# Patient Record
Sex: Female | Born: 1940
Health system: Southern US, Community
[De-identification: ages and names within clinical notes are randomized; demographics above are authoritative.]

## PROBLEM LIST (undated history)

## (undated) DIAGNOSIS — M545 Low back pain, unspecified: Secondary | ICD-10-CM

## (undated) DIAGNOSIS — R011 Cardiac murmur, unspecified: Secondary | ICD-10-CM

## (undated) DIAGNOSIS — F0390 Unspecified dementia without behavioral disturbance: Secondary | ICD-10-CM

## (undated) DIAGNOSIS — F32A Depression, unspecified: Secondary | ICD-10-CM

## (undated) DIAGNOSIS — F419 Anxiety disorder, unspecified: Secondary | ICD-10-CM

## (undated) DIAGNOSIS — S82899A Other fracture of unspecified lower leg, initial encounter for closed fracture: Secondary | ICD-10-CM

## (undated) DIAGNOSIS — M797 Fibromyalgia: Secondary | ICD-10-CM

## (undated) DIAGNOSIS — M1990 Unspecified osteoarthritis, unspecified site: Secondary | ICD-10-CM

## (undated) DIAGNOSIS — K219 Gastro-esophageal reflux disease without esophagitis: Secondary | ICD-10-CM

## (undated) DIAGNOSIS — I214 Non-ST elevation (NSTEMI) myocardial infarction: Secondary | ICD-10-CM

## (undated) DIAGNOSIS — H3500 Unspecified background retinopathy: Secondary | ICD-10-CM

## (undated) DIAGNOSIS — I2510 Atherosclerotic heart disease of native coronary artery without angina pectoris: Secondary | ICD-10-CM

## (undated) DIAGNOSIS — F329 Major depressive disorder, single episode, unspecified: Secondary | ICD-10-CM

## (undated) DIAGNOSIS — G8929 Other chronic pain: Secondary | ICD-10-CM

## (undated) DIAGNOSIS — G459 Transient cerebral ischemic attack, unspecified: Secondary | ICD-10-CM

## (undated) DIAGNOSIS — E109 Type 1 diabetes mellitus without complications: Secondary | ICD-10-CM

## (undated) DIAGNOSIS — M8580 Other specified disorders of bone density and structure, unspecified site: Secondary | ICD-10-CM

## (undated) HISTORY — PX: APPENDECTOMY: SHX54

## (undated) HISTORY — PX: EYE SURGERY: SHX253

## (undated) HISTORY — PX: TONSILLECTOMY: SUR1361

## (undated) HISTORY — PX: EXPLORATORY LAPAROTOMY: SUR591

## (undated) HISTORY — PX: DILATION AND CURETTAGE OF UTERUS: SHX78

## (undated) HISTORY — PX: BACK SURGERY: SHX140

## (undated) HISTORY — PX: LAPAROSCOPIC LYSIS OF ADHESIONS: SHX5905

## (undated) HISTORY — PX: FRACTURE SURGERY: SHX138

## (undated) HISTORY — PX: BILATERAL CARPAL TUNNEL RELEASE: SHX6508

---

## 1970-06-07 HISTORY — PX: ABDOMINAL HYSTERECTOMY: SHX81

## 1979-02-06 HISTORY — PX: OOPHORECTOMY: SHX86

## 1996-06-07 HISTORY — PX: CATARACT EXTRACTION W/ INTRAOCULAR LENS  IMPLANT, BILATERAL: SHX1307

## 1997-08-05 HISTORY — PX: VITRECTOMY: SHX106

## 1997-09-05 ENCOUNTER — Encounter: Admission: RE | Admit: 1997-09-05 | Discharge: 1997-12-04 | Payer: Self-pay | Admitting: Orthopedic Surgery

## 1998-11-11 ENCOUNTER — Emergency Department (HOSPITAL_COMMUNITY): Admission: EM | Admit: 1998-11-11 | Discharge: 1998-11-11 | Payer: Self-pay | Admitting: Emergency Medicine

## 1998-11-11 ENCOUNTER — Encounter: Payer: Self-pay | Admitting: Emergency Medicine

## 1999-01-29 ENCOUNTER — Ambulatory Visit: Admission: RE | Admit: 1999-01-29 | Discharge: 1999-01-29 | Payer: Self-pay | Admitting: Orthopedic Surgery

## 1999-01-29 ENCOUNTER — Encounter: Payer: Self-pay | Admitting: Orthopedic Surgery

## 1999-02-02 ENCOUNTER — Ambulatory Visit (HOSPITAL_COMMUNITY): Admission: RE | Admit: 1999-02-02 | Discharge: 1999-02-02 | Payer: Self-pay | Admitting: Internal Medicine

## 1999-02-02 ENCOUNTER — Inpatient Hospital Stay (HOSPITAL_COMMUNITY): Admission: EM | Admit: 1999-02-02 | Discharge: 1999-02-03 | Payer: Self-pay | Admitting: Emergency Medicine

## 1999-02-02 ENCOUNTER — Encounter: Payer: Self-pay | Admitting: Internal Medicine

## 1999-02-06 HISTORY — PX: BUNIONECTOMY WITH HAMMERTOE RECONSTRUCTION: SHX5600

## 1999-03-06 ENCOUNTER — Ambulatory Visit (HOSPITAL_COMMUNITY): Admission: RE | Admit: 1999-03-06 | Discharge: 1999-03-07 | Payer: Self-pay | Admitting: Orthopedic Surgery

## 1999-07-09 HISTORY — PX: CORONARY ANGIOPLASTY WITH STENT PLACEMENT: SHX49

## 1999-07-24 ENCOUNTER — Ambulatory Visit (HOSPITAL_COMMUNITY): Admission: RE | Admit: 1999-07-24 | Discharge: 1999-07-25 | Payer: Self-pay | Admitting: Cardiovascular Disease

## 1999-08-11 ENCOUNTER — Ambulatory Visit (HOSPITAL_COMMUNITY): Admission: RE | Admit: 1999-08-11 | Discharge: 1999-08-11 | Payer: Self-pay | Admitting: Gastroenterology

## 1999-08-27 ENCOUNTER — Ambulatory Visit (HOSPITAL_COMMUNITY): Admission: RE | Admit: 1999-08-27 | Discharge: 1999-08-27 | Payer: Self-pay | Admitting: Endocrinology

## 1999-08-27 ENCOUNTER — Encounter: Payer: Self-pay | Admitting: Endocrinology

## 2000-03-02 ENCOUNTER — Emergency Department (HOSPITAL_COMMUNITY): Admission: EM | Admit: 2000-03-02 | Discharge: 2000-03-02 | Payer: Self-pay | Admitting: Emergency Medicine

## 2000-03-22 ENCOUNTER — Inpatient Hospital Stay (HOSPITAL_COMMUNITY): Admission: EM | Admit: 2000-03-22 | Discharge: 2000-03-22 | Payer: Self-pay | Admitting: Emergency Medicine

## 2000-04-22 ENCOUNTER — Ambulatory Visit (HOSPITAL_COMMUNITY): Admission: RE | Admit: 2000-04-22 | Discharge: 2000-04-22 | Payer: Self-pay | Admitting: Endocrinology

## 2000-05-01 ENCOUNTER — Emergency Department (HOSPITAL_COMMUNITY): Admission: EM | Admit: 2000-05-01 | Discharge: 2000-05-01 | Payer: Self-pay | Admitting: Emergency Medicine

## 2000-05-01 ENCOUNTER — Encounter: Payer: Self-pay | Admitting: Emergency Medicine

## 2000-07-04 ENCOUNTER — Encounter: Admission: RE | Admit: 2000-07-04 | Discharge: 2000-07-14 | Payer: Self-pay | Admitting: Orthopedic Surgery

## 2001-03-14 ENCOUNTER — Other Ambulatory Visit: Admission: RE | Admit: 2001-03-14 | Discharge: 2001-03-14 | Payer: Self-pay | Admitting: Obstetrics and Gynecology

## 2001-05-29 ENCOUNTER — Encounter: Admission: RE | Admit: 2001-05-29 | Discharge: 2001-08-27 | Payer: Self-pay | Admitting: Orthopedic Surgery

## 2001-06-07 HISTORY — PX: TRIGGER FINGER RELEASE: SHX641

## 2001-06-23 ENCOUNTER — Ambulatory Visit: Admission: RE | Admit: 2001-06-23 | Discharge: 2001-06-23 | Payer: Self-pay | Admitting: Orthopedic Surgery

## 2002-04-12 ENCOUNTER — Other Ambulatory Visit: Admission: RE | Admit: 2002-04-12 | Discharge: 2002-04-12 | Payer: Self-pay | Admitting: Obstetrics and Gynecology

## 2002-06-07 HISTORY — PX: CERVICAL DISC SURGERY: SHX588

## 2002-10-03 ENCOUNTER — Ambulatory Visit (HOSPITAL_COMMUNITY): Admission: RE | Admit: 2002-10-03 | Discharge: 2002-10-03 | Payer: Self-pay | Admitting: Gastroenterology

## 2003-04-18 ENCOUNTER — Encounter: Admission: RE | Admit: 2003-04-18 | Discharge: 2003-04-18 | Payer: Self-pay | Admitting: Internal Medicine

## 2003-05-08 HISTORY — PX: ANTERIOR CERVICAL DECOMP/DISCECTOMY FUSION: SHX1161

## 2003-05-09 ENCOUNTER — Inpatient Hospital Stay (HOSPITAL_COMMUNITY): Admission: RE | Admit: 2003-05-09 | Discharge: 2003-05-10 | Payer: Self-pay | Admitting: Orthopaedic Surgery

## 2004-09-30 ENCOUNTER — Encounter: Admission: RE | Admit: 2004-09-30 | Discharge: 2004-09-30 | Payer: Self-pay | Admitting: Internal Medicine

## 2005-03-07 HISTORY — PX: LUMBAR MICRODISCECTOMY: SHX99

## 2005-03-15 ENCOUNTER — Ambulatory Visit (HOSPITAL_COMMUNITY): Admission: RE | Admit: 2005-03-15 | Discharge: 2005-03-17 | Payer: Self-pay | Admitting: Orthopaedic Surgery

## 2005-04-14 ENCOUNTER — Emergency Department (HOSPITAL_COMMUNITY): Admission: EM | Admit: 2005-04-14 | Discharge: 2005-04-14 | Payer: Self-pay | Admitting: Family Medicine

## 2005-08-12 ENCOUNTER — Encounter: Admission: RE | Admit: 2005-08-12 | Discharge: 2005-08-12 | Payer: Self-pay | Admitting: Specialist

## 2005-11-18 ENCOUNTER — Encounter: Admission: RE | Admit: 2005-11-18 | Discharge: 2005-11-18 | Payer: Self-pay | Admitting: Gastroenterology

## 2007-04-08 HISTORY — PX: PATELLA FRACTURE SURGERY: SHX735

## 2007-04-27 ENCOUNTER — Ambulatory Visit (HOSPITAL_BASED_OUTPATIENT_CLINIC_OR_DEPARTMENT_OTHER): Admission: RE | Admit: 2007-04-27 | Discharge: 2007-04-28 | Payer: Self-pay | Admitting: Orthopedic Surgery

## 2007-09-19 ENCOUNTER — Emergency Department (HOSPITAL_COMMUNITY): Admission: EM | Admit: 2007-09-19 | Discharge: 2007-09-19 | Payer: Self-pay | Admitting: Emergency Medicine

## 2007-12-17 ENCOUNTER — Encounter: Admission: RE | Admit: 2007-12-17 | Discharge: 2007-12-17 | Payer: Self-pay | Admitting: Orthopedic Surgery

## 2007-12-27 ENCOUNTER — Encounter: Admission: RE | Admit: 2007-12-27 | Discharge: 2007-12-27 | Payer: Self-pay | Admitting: Orthopedic Surgery

## 2008-01-06 HISTORY — PX: VERTEBROPLASTY: SHX113

## 2008-01-15 ENCOUNTER — Encounter: Admission: RE | Admit: 2008-01-15 | Discharge: 2008-01-15 | Payer: Self-pay | Admitting: Orthopedic Surgery

## 2008-01-20 ENCOUNTER — Encounter: Admission: RE | Admit: 2008-01-20 | Discharge: 2008-01-20 | Payer: Self-pay | Admitting: Radiology

## 2008-01-31 ENCOUNTER — Encounter: Admission: RE | Admit: 2008-01-31 | Discharge: 2008-01-31 | Payer: Self-pay | Admitting: Radiology

## 2008-08-18 ENCOUNTER — Emergency Department (HOSPITAL_COMMUNITY): Admission: EM | Admit: 2008-08-18 | Discharge: 2008-08-18 | Payer: Self-pay | Admitting: Emergency Medicine

## 2009-06-27 ENCOUNTER — Emergency Department (HOSPITAL_COMMUNITY): Admission: EM | Admit: 2009-06-27 | Discharge: 2009-06-27 | Payer: Self-pay | Admitting: Emergency Medicine

## 2010-06-29 ENCOUNTER — Encounter: Payer: Self-pay | Admitting: Radiology

## 2010-08-24 LAB — BASIC METABOLIC PANEL
BUN: 7 mg/dL (ref 6–23)
CO2: 28 mEq/L (ref 19–32)
Calcium: 9.6 mg/dL (ref 8.4–10.5)
Chloride: 100 mEq/L (ref 96–112)
Creatinine, Ser: 0.74 mg/dL (ref 0.4–1.2)
GFR calc Af Amer: >60 mL/min (ref >60)
GFR calc non Af Amer: >60 mL/min (ref >60)
Glucose, Bld: 141 mg/dL — ABNORMAL HIGH (ref 70–99)
Potassium: 3.8 mEq/L (ref 3.5–5.1)
Sodium: 136 mEq/L (ref 135–145)

## 2010-08-24 LAB — CBC
HCT: 33.2 % — ABNORMAL LOW (ref 36.0–46.0)
Hemoglobin: 11 g/dL — ABNORMAL LOW (ref 12.0–15.0)
MCHC: 33.1 g/dL (ref 30.0–36.0)
MCV: 86.2 fL (ref 78.0–100.0)
Platelets: 239 10*3/uL (ref 150–400)
RBC: 3.86 MIL/uL — ABNORMAL LOW (ref 3.87–5.11)
RDW: 14.9 % (ref 11.5–15.5)
WBC: 3.9 10*3/uL — ABNORMAL LOW (ref 4.0–10.5)

## 2010-08-24 LAB — DIFFERENTIAL
Basophils Absolute: 0 10*3/uL (ref 0.0–0.1)
Basophils Relative: 1 % (ref 0–1)
Monocytes Absolute: 0.3 10*3/uL (ref 0.1–1.0)
Neutro Abs: 2 10*3/uL (ref 1.7–7.7)
Neutrophils Relative %: 53 % (ref 43–77)

## 2010-08-24 LAB — GLUCOSE, CAPILLARY: Glucose-Capillary: 133 mg/dL — ABNORMAL HIGH (ref 70–99)

## 2010-10-20 NOTE — Op Note (Signed)
Beth Payne, Beth Payne              ACCOUNT NO.:  [id number]   MEDICAL RECORD NO.:  [id number]          PATIENT TYPE:  AMB   LOCATION:  DSC                          FACILITY:  [facility]   PHYSICIAN:  Loreta Ave, M.D. DATE OF BIRTH:  05-23-1941   DATE OF PROCEDURE:  04/27/2007  DATE OF DISCHARGE:                               OPERATIVE REPORT   PREOPERATIVE DIAGNOSIS:  Markedly comminuted closed left patellar  fracture, displaced.   POSTOPERATIVE DIAGNOSIS:  Markedly comminuted closed left patellar  fracture, displaced.   PROCEDURE:  Open reduction internal fixation, left patella fracture with  two cannulated 4.0 screws and figure-of-eight 18-gauge wire placed  through the screws.   SURGEON:  Loreta Ave, MD   ASSISTANT:  Zonia Kief, PA present throughout the entire case.   ANESTHESIA:  General.   BLOOD LOSS:  Minimal.   TOURNIQUET TIME:  1 hour.   SPECIMENS:  None.   COMPLICATIONS:  None.   DRESSING:  Soft compressive with knee immobilizer.   PROCEDURE:  The patient brought to the operating room, placed on  operating table in supine position.  After adequate anesthesia been  obtained, tourniquet applied to the upper aspect of the left leg.  Prepped and draped in usual sterile fashion.  Exsanguinated with  elevation and Esmarch, tourniquet inflated to 300 mmHg.  Straight  incision over the front of the knee.  Skin and subcutaneous tissue  divided.  Front of the patella exposed.  Four main fragments, one above,  one on either side, one below.  These were all reduced as anatomically  as possible.  The hemarthrosis drained from the knee.  The top fragment  had some separation of the articular cartilage behind this.  I was able  to reduce all fragments anatomically, put two K-wires and two screws  across this and then laced a figure-of-eight wire through these.  This  gave excellent tension, repair and reconstruction of the entire patella.  I still had a little bit  of a step off articular cartilage that through  a small lateral arthrotomy I could reduce so that it was covered with no  more than a mm step off throughout.  Going through full motion,  fragments did not move and the knee cap felt very smooth.  Fluoroscopic  guidance used to confirm adequate reduction and fixation.  The knee  thoroughly irrigated from hemarthrosis, a small lateral arthrotomy  closed.  The patellar tendon was then closed over top of the  front of patella to cover up the hardware so it would not be painful.  Wound irrigated and closed with Vicryl and staples.  Sterile compressive  dressing applied.  Tourniquet deflated and removed.  Knee immobilizer  applied.  Anesthesia reversed.  Brought to the recovery room.  Tolerated  surgery well.  No complications.      Loreta Ave, M.D.  Electronically Signed     DFM/MEDQ  D:  04/27/2007  T:  04/28/2007  Job:  [id number]

## 2010-10-23 NOTE — Op Note (Signed)
   Beth Payne, Beth Payne                        ACCOUNT NO.:  [id number]   MEDICAL RECORD NO.:  [id number]                   PATIENT TYPE:  AMB   LOCATION:  ENDO                                 FACILITY:  [facility]   PHYSICIAN:  Bernette Redbird, M.D.                DATE OF BIRTH:  December 03, 1940   DATE OF PROCEDURE:  10/03/2002  DATE OF DISCHARGE:                                 OPERATIVE REPORT   PROCEDURE PERFORMED:  Colonoscopy.   ENDOSCOPIST:  Florencia Reasons, M.D.   INDICATIONS FOR PROCEDURE:  Screening for colon cancer in a 70 year old  diabetic with a family history of colon cancer.   FINDINGS:  Grossly normal exam to the cecum.  Mediocre prep.   DESCRIPTION OF PROCEDURE:  The nature, purpose and risks of the procedure  were familiar to the patient, who provided written consent.  Sedation was  fentanyl  75 mcg and Versed 9 mg IV prior to and during the course of the  procedure.   The Olympus adjustable tension pediatric video colonoscope was advanced with  some looping through the colon, controlled by external abdominal  compression, turning the patient into the supine position and ultimately  reaching the cecum after taking out multiple loops.  The adult colonoscope  might work better on future exams.  The quality of the prep was except in  several areas where there was some puddling of vegetable debris which would  clog the scope and prevent complete suctioning of the puddles.  Thus, there  were some small areas where mucosal lesions or small sessile polyps might  have been obscured, but it is not felt that any large lesions would have  been missed.   This was a normal examination.  No polyps, cancer, colitis, vascular  malformations or diverticulosis were noted.  Retroflexion in the rectum and  reinspection of the rectum was normal.  No biopsies were obtained.  The  patient tolerated the procedure well and there were no apparent  complications.    IMPRESSION:  Normal  colonoscopy in a patient with family history of colon  cancer.   PLAN:  Repeat colonoscopy in five years in view of the family history of  colon cancer.                                                 Bernette Redbird, M.D.    RB/MEDQ  D:  10/03/2002  T:  10/03/2002  Job:  [id number]   cc:   Jeannett Senior A. Evlyn Kanner, M.D.  [address]  [place]  [place] [address]  Fax: [phone]

## 2010-10-23 NOTE — Op Note (Signed)
Beth Payne, Beth Payne              ACCOUNT NO.:  [id number]   MEDICAL RECORD NO.:  [id number]          PATIENT TYPE:  INP   LOCATION:  2852                         FACILITY:  [facility]   PHYSICIAN:  Sharolyn Douglas, M.D.        DATE OF BIRTH:  1940-08-18   DATE OF PROCEDURE:  03/15/2005  DATE OF DISCHARGE:                                 OPERATIVE REPORT   DIAGNOSIS:  Right L5-S1 herniated nucleus pulposus with right lower  extremity radiculopathy.   PROCEDURE:  Right L5-S1 microdiskectomy.   SURGEON:  Sharolyn Douglas, M.D.   ASSISTANT:  Verlin Fester, P.A.   ANESTHESIA:  General endotracheal.   COMPLICATIONS:  None.   INDICATION:  The patient is a 70 year old female with severe pain and  weakness in her right lower extremity secondary to large extruded disk  herniation at L5-S1.  Her symptoms are refractory to pain indication and her  weakness is progressing.  We therefore elected to proceed with L5-S1  microdiskectomy in hopes of improving her symptoms.  Risks and benefits were  reviewed.   PROCEDURE:  The patient was identified in the holding area and taken to the  operating room, underwent general endotracheal anesthesia without  difficulty, given prophylactic IV antibiotics.  Carefully positioned prone  on the Wilson frame and all bony prominences padded, face and eyes protected  at all times, back prepped and draped in usual sterile fashion.  Fluoroscopy  was into the field and the L5-S1 interspace was identified.  A small 2-cm  incision made.  Dissection was carried through the deep fascia.  The  dilators from the [facility] retractor were used to dock onto the L5-S1  interspace using fluoroscopy.  The 50-mm blades were attached to the MaXcess  retractor and placed over the final dilator.  The retractor was spread and  then attached to the operating room table using the attachment arm.  The  microscope was draped and brought into the field.  The L5-S1 level was  confirmed using  fluoroscopy.  The inferior 1/3 of the L5 lamina was removed  using a high-speed bur.  The ligamentum flavum was removed piecemeal using  Kerrison punches.  The S1 nerve root was identified and gently mobilized  medially.  A large disk herniation was identified.  This disk space was  entered and multiple small degenerative materials of disk were removed.  We  then worked underneath the dura, decompressing the extruded herniation back  into the interspace and removing this with pituitaries.  The disk space was  irrigated.  Complete hemostasis was achieved.  We felt we had a good  decompression of spinal canal; the S1 nerve root was completely free.  The  wound was irrigated.  Two milliliters of fentanyl were left in the exposed  epidural space for postoperative analgesia.  A small piece of Gelfoam  was left over the laminotomy.  Deep fascia was closed with an 0 Vicryl,  subcutaneous layer closed with 2-0 Vicryl followed by Dermabond on the skin  edges.  The patient was turned supine, extubated without difficulty and  transferred to Recovery  in stable condition.      Sharolyn Douglas, M.D.  Electronically Signed     MC/MEDQ  D:  03/15/2005  T:  03/16/2005  Job:  [id number]

## 2010-10-23 NOTE — Cardiovascular Report (Signed)
[facility]  Patient:    Beth Payne, Beth Payne                     MRN: [id number] Proc. Date: 03/22/00 Adm. Date:  [id number] Disc. Date: [id number] Attending:  Koren Bound CC:         Tera Mater Evlyn Kanner, M.D.                        Cardiac Catheterization  INDICATIONS:  Ms. Huebert is a 70 year old female with a history of diabetes mellitus and history of coronary artery disease.  She presented to the office today with 24 hours of chest pain relieved with nitroglycerin.  She was brought to the catheterization lab for urgent heart catheterization for further evaluation of this persistent chest pain.  DESCRIPTION OF PROCEDURE:  The right femoral artery was easily cannulated using a modified Seldinger technique.  HEMODYNAMICS:  The LV pressure is 154/17 with an aortic pressure of 155/74.  ANGIOGRAPHY:  The left main coronary artery is relatively smooth and normal.  The left circumflex artery is very heavily calcified but there are no true stenoses in the proximal segment.  There are minor luminal irregularities in the mid segment between 20-30%.  The mid and distal LAD have only minor luminal irregularities but are otherwise unremarkable.  The first diagonal vessel is a fairly large vessel and is essentially normal.  The left circumflex artery is fairly normal in its proximal segment.  It gives off two obtuse marginal arteries which are normal.  The third obtuse marginal artery is relatively small and is normal.  The circumflex artery terminates as a small posterolateral segment artery.  The right coronary artery is a large and dominant vessel.  There is a 20-30% stenosis at the proximal bend.  The mid RCA stent is widely patent.  The posterior descending artery and several posterolateral segment arteries are all normal.  LEFT VENTRICULOGRAM:  The left ventriculogram was performed in a 30 RAO position.  It reveals overall normal left  ventricular systolic function with no segmental wall motion abnormalities.  There is a whiff of mitral regurgitation but it does not appear to be significant.  Some of this mitral regurgitation could be due to catheter-induced ectopy.  COMPLICATIONS:  None.  CONCLUSIONS: 1. Patent coronary arteries. 2. Widely patent right coronary artery stent. 3. Overall normal left ventricular systolic function. DD:  03/22/00 TD:  03/23/00 Job: [id number]

## 2010-10-23 NOTE — Op Note (Signed)
Beth Payne, Beth Payne                        ACCOUNT NO.:  [id number]   MEDICAL RECORD NO.:  [id number]                   PATIENT TYPE:  INP   LOCATION:  NA                                   FACILITY:  [facility]   PHYSICIAN:  Sharolyn Douglas, M.D.                     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   PROCEDURE:  1. Anterior cervical diskectomy C5-6.  2. Anterior cervical arthrodesis C5-6 with placement of a 6-mm NuVasive     allograft prosthesis spacer packed with local autogenous bone graft.  3. Anterior cervical plating C5-6 utilizing the Trinica system.   SURGEON:  Sharolyn Douglas, M.D.   ASSISTANT:  Verlin Fester, P.A.   ANESTHESIA:  General endotracheal anesthesia.   COMPLICATIONS:  None.   INDICATIONS FOR PROCEDURE:  The patient is a 70 year old female with chronic  neck  and left upper extremity pain. Her plain radiographs showed  degenerative changes at C5-6 with left anterior and posterior  osteophytes.  An MRI scan shows similar findings with neuroforaminal narrowing. Because of  her persistent symptoms unresponsive to conservative care, she has elected  to undergo ACDF at C5-6 in hopes of improving her symptomatology. The  risks, benefits and alternatives were extensively discussed and she elected  to proceed.   DESCRIPTION OF PROCEDURE:  The patient was properly identified  in the  holding area and taken to the operating room. She underwent  general  endotracheal anesthesia without difficulty. She was given prophylactic IV  antibiotics. She was carefully positioned on the operating table with the  neck  in slight extension. The  neck  was prepped and draped in the usual  sterile fashion and 5 pounds of halter traction was applied.   A 3-cm incision was made on the left side in a transverse fashion at the  level of the cricoid cartilage. Dissection was carried sharply through the  platysma. The antral between the SCM and the strap muscles  was developed  down to the prevertebral space. The C5-6 level was easily identified  by the  large anterior osteophytes. A spinal needle  was placed and x-rays were  taken to confirm this location. The esophagus, trachea and carotid sheath  were identified  and protected at all times.   The longus coli muscle was elevated out over  the C5-6 disk space  bilaterally  using electrocautery. We placed a deep Shadowline retractor.  The large anterior osteophyte was taken down with a Leksell rongeur. We then  placed Caspar distraction pins and placed general distraction across the  C5-  6 interspace. This allowed [place] to enter the severely compressed space using  curets, performing a diskectomy back to the posterior longitudinal ligament.  There were large uncovertebral spurs and posterior  osteophytes.  These  again were debrided with the high-speed bur and 2-mm Kerrison.   We completed wide foraminotomies bilaterally. We confirmed  that the  foramina were  patent using a blunt probe. We controlled all bleeding with  bipolar electrocautery and Gelfoam. The cartilaginous endplates were  scraped.   We then placed a 6-mm NuVasive allograft prosthesis spacer which had been  packed with local autogenous bone graft obtained from the bur shavings. The  graft was carefully  countersunk 1 mm. We checked posteriorly  to confirm  that there was room. We then placed a 22-mm Trinica plate with four 12-mm  screws. The screws had good purchase. We ensured the locking mechanism  engaged.   The wound was irrigated. The esophagus, trachea and carotid sheath were  examined and found to be intact. We  took intraoperative x-rays showing good  position of the interbody graft  and plate. The deep Penrose drain was  placed. The platysma was closed with interrupted 2-0 Vicryl subcutaneous  layer closed with interrupted 3-0 Vicryl followed  by a running 4-0 Vicryl  in a subcuticular fashion on the skin. Benzoin and Steri-Strips were placed.  A sterile dressing was applied. A soft collar was placed.   The patient was extubated without difficulty and transferred to the recovery  room in stable condition. She was able to move her upper and lower  extremities.                                               Sharolyn Douglas, M.D.    MC/MEDQ  D:  05/09/2003  T:  05/10/2003  Job:  [id number]

## 2010-10-23 NOTE — H&P (Signed)
NAMEAERABELLA, GALASSO                        ACCOUNT NO.:  [id number]   MEDICAL RECORD NO.:  [id number]                   PATIENT TYPE:  INP   LOCATION:  NA                                   FACILITY:  [facility]   PHYSICIAN:  Sharolyn Douglas, M.D.                     DATE OF BIRTH:  04-Mar-1941   DATE OF ADMISSION:  05/09/2003  DATE OF DISCHARGE:                                HISTORY & PHYSICAL   CHIEF COMPLAINT:  Severe neck pain and left upper extremity pain and  numbness and weakness.   HISTORY OF PRESENT ILLNESS:  The patient is a 70 year old female who has  been suffering with neck vein and upper extremity pain numbness and weakness  for some time now.  She has failed to improve, unfortunately, despite  conservative management including anti-inflammatory medications, pain  medications, activity modification and physical therapy.  At this point, her  symptoms are progressively getting worse and starting to limit her  activities of daily living, and quality of life has suffered.  Secondary to  these physical exam findings as well as findings of cervical radiculopathy  at C5-6, it was felt that her best course of management would be an ACDS at  C5-6.  The risks and benefits of this procedure were discussed with the  patient by Dr. Noel Gerold as well as myself and she indicated understanding and  opted to proceed.   ALLERGIES:  ANTICOAGULANTS, CODEINE causing nausea and vomiting, VIBRAMYCIN  causes rash and swelling, and VOLTAREN causes retinal hemorrhages.   MEDICATIONS:  1. Hormone replacement.  2. Nexium 40 mg daily.  3. Effexor 75 mg daily.  4. Fosamax 70 mg every week.  5. Calcium daily.  6. Enteric coated aspirin daily.  7. Nitrotab 0.4 mg as needed.  8. The patient is also on insulin pump at a rate of 12.5/h and a bolus rate     of 1-15   PAST MEDICAL HISTORY:  1. Significant for type 1 diabetes x51 years.  2. Retinopathy.  3. Cataract.  4. Arthritis.  5. GERD.  6. Coronary  artery disease.   PAST SURGICAL HISTORY:  1. C-section in 1968.  2. Hysterectomy in 1972.  3. Numerous laparoscopies secondary to adhesions.  4. Cataracts bilaterally in 1998.  5. Bunionectomy in 2000.  6. Cardiac stent placement in February 2001.  7. Trigger finger release in 2003.  8. Carpal tunnel release in 2004.   SOCIAL HISTORY:  The patient denies tobacco use and alcohol use.  She is  widowed, had 2 grown children, has 1 grandchild. She does have numerous  friends in the area through her postoperative course.   FAMILY MEDICAL HISTORY:  Noncontributory.   REVIEW OF SYSTEMS:  The patient denies fevers, chills, sweats, or bleeding  tendencies.  CNS:  Denies blurred vision or double vision, seizures,  headache or paralysis.  CARDIOVASCULAR:  Denies chest pain,  angina, No  claudication or palpitations.  PULMONARY:  Denies shortness of breath, cough  or hemoptysis.  GI:  Denies nausea, vomiting, constipation or diarrhea,  melena or bloody stool.  GU: No dysuria, hematuria, or discharge.  MUSCULOSKELETAL:  As per HPI.   PHYSICAL EXAMINATION:  VITAL SIGNS:  Pulse is 86 and regular; respirations  16 and unlabored.  GENERAL APPEARANCE:  The patient is a 70 year old black female who is alert  and oriented in no acute distress. She is well nourished and well groomed.  Appears her stated age, pleasant, and cooperative to exam.  HEENT:  Head is normocephalic, atraumatic.  Pupils are equal, round, and  reactive.  Extraocular movements are intact.  Nares patent.  Pharynx clear.  NECK:  Supple to palpation.  No lymphadenopathy, thyromegaly or bruits  appreciated.  CHEST:  Chest was clear to auscultation bilaterally.  No rales, rhonchi,  stridor or wheezes.  BREASTS:  Not pertinent and not performed.  HEART:  S1, S2 regular rate and rhythm.  No murmurs, gallops, or rubs noted.  ABDOMEN:  Soft to palpation, nontender, nondistended, no organomegaly noted.  Positive bowel sounds  throughout.  GENITOURINARY:  Not pertinent, not performed.  EXTREMITIES:  Motor function and sensation are grossly intact x4.  Pulses  are intact and symmetric.  Calves are soft and nontender.  SKIN:  Skin is intact without any lesions or rashes.   X-ray and MRI revealed degenerative disk disease in cervical spine.   IMPRESSION:  1. 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  2. Diabetes type x 51 years.  3. Cataracts.  4. Gastroesophageal reflux disease.  5. Retinopathy.   PLAN:  1. Admit to [facility] on May 09, 2003 for an ACDS of C5-6 will     be done by Dr. Sharolyn Douglas.  2. Patient's primary care physician Dr. Evlyn Kanner.  3. Patient's cardiologist Armanda Magic, M.D.  4. We will manage the patient's diabetes. She will continue on her insulin     pump assuming she is alert postoperatively and oriented enough to manage     it; otherwise we will put her on a sliding scale insulin and resume the     insulin pump when she is able to manage it.      Verlin Fester, P.A.                       Sharolyn Douglas, M.D.    CM/MEDQ  D:  05/09/2003  T:  05/09/2003  Job:  [id number]

## 2010-10-23 NOTE — Cardiovascular Report (Signed)
[facility]  Patient:    Beth Payne, Beth Payne                     MRN: [id number] Proc. Date: 07/24/99 Adm. Date:  [id number] Attending:  Koren Bound CC:         Cardiac Catheterization Laboratory             Jeannett Senior A. Evlyn Kanner, M.D.                        Cardiac Catheterization  PROCEDURE:  Left heart catheterization with coronary angiography, with a percutaneous transluminal coronary angioplasty and stenting of the right coronary artery.  CARDIOLOGIST:  Alvia Grove., M.D.  INDICATIONS:  [name] is a 70 year old female with a history of diabetes mellitus and coronary artery disease.  She has a known moderate stenosis in her right coronary artery by heart catheterization in August 2000.  She presented to the office with several weeks of exertional angina.  She is referred for a catheterization and possible angioplasty, for further evaluation.  DESCRIPTION OF PROCEDURE:  The right femoral artery was easily cannulated using a modified Seldinger technique.  HEMODYNAMICS: Left ventricular pressure:  158/12. Aortic pressure:  158/69.  ANGIOGRAPHY: 1. Left main coronary artery:  Is relatively normal. 2. Left anterior descending coronary artery: Is very heavily calcified in the    proximal segment, although this is associated with only a 20%-30% stenosis.    The LAD has only minor luminal irregularities throughout its course. 3. Left circumflex coronary artery:  Is a moderate to large vessel.  It gives    off several moderate-sized obtuse marginal branches, all of which are fairly    normal. 4. Right coronary artery:  Is a moderate to large vessel. It is dominant.    There are diffuse irregularities in the proximal segment between 20%-30%,    followed by a midstenosis of approximately 70%-75% stenosis.  The distal    right coronary artery has diffuse irregularities between 20%-30%.  LEFT VENTRICULOGRAM:  Was performed in the  30-degree RAO position.  It reveals normal left ventricular systolic function with no segmental wall motion abnormalities.  PERCUTANEOUS TRANSLUMINAL CORONARY ANGIOPLASTY:  The 6-French catheter and sheath were removed and a 7-French system was inserted into the right femoral artery.  7-French multipurpose hockey stick side-hole guide was placed into the ostium of the right coronary artery, and 300 units of heparin were given, as well as a double bolus Integrilin drip.  A short luge wire was placed into the distal right coronary artery and a 3.0 mm x 15.0 mm NIR [place]ale with SOX was placed down into the midlesion.  It was deployed at 12 atmospheres for 57 seconds, resulting in a nice angiographic lumen.  There was brisk flow and no evidence of dissection.  COMPLICATIONS:  None.  CONCLUSION: 1. Successful percutaneous transluminal coronary angioplasty and stenting of    the mid-right coronary artery. 2. Normal left ventricular systolic function.DD:  07/24/99 TD:  07/25/99 Job: [id number]

## 2010-12-15 ENCOUNTER — Ambulatory Visit: Payer: Medicare Other | Attending: Neurology | Admitting: Physical Therapy

## 2010-12-15 DIAGNOSIS — R269 Unspecified abnormalities of gait and mobility: Secondary | ICD-10-CM | POA: Insufficient documentation

## 2010-12-15 DIAGNOSIS — M6281 Muscle weakness (generalized): Secondary | ICD-10-CM | POA: Insufficient documentation

## 2010-12-15 DIAGNOSIS — IMO0001 Reserved for inherently not codable concepts without codable children: Secondary | ICD-10-CM | POA: Insufficient documentation

## 2010-12-24 ENCOUNTER — Ambulatory Visit: Payer: Medicare Other | Admitting: Physical Therapy

## 2010-12-29 ENCOUNTER — Ambulatory Visit: Payer: Medicare Other | Admitting: Physical Therapy

## 2011-01-01 ENCOUNTER — Ambulatory Visit: Payer: Medicare Other | Admitting: Physical Therapy

## 2011-01-06 ENCOUNTER — Ambulatory Visit: Payer: Medicare Other | Attending: Neurology | Admitting: Physical Therapy

## 2011-01-06 DIAGNOSIS — M6281 Muscle weakness (generalized): Secondary | ICD-10-CM | POA: Insufficient documentation

## 2011-01-06 DIAGNOSIS — IMO0001 Reserved for inherently not codable concepts without codable children: Secondary | ICD-10-CM | POA: Insufficient documentation

## 2011-01-06 DIAGNOSIS — R269 Unspecified abnormalities of gait and mobility: Secondary | ICD-10-CM | POA: Insufficient documentation

## 2011-01-08 ENCOUNTER — Ambulatory Visit: Payer: Medicare Other | Admitting: Physical Therapy

## 2011-01-11 ENCOUNTER — Encounter: Payer: Medicare Other | Admitting: Physical Therapy

## 2011-01-12 ENCOUNTER — Ambulatory Visit: Payer: Medicare Other | Admitting: Physical Therapy

## 2011-01-14 ENCOUNTER — Ambulatory Visit: Payer: Medicare Other | Admitting: Physical Therapy

## 2011-01-18 ENCOUNTER — Ambulatory Visit: Payer: Medicare Other | Admitting: Physical Therapy

## 2011-01-21 ENCOUNTER — Ambulatory Visit: Payer: Medicare Other | Admitting: Physical Therapy

## 2011-01-25 ENCOUNTER — Ambulatory Visit: Payer: Medicare Other | Admitting: Physical Therapy

## 2011-01-28 ENCOUNTER — Ambulatory Visit: Payer: Medicare Other | Admitting: Physical Therapy

## 2011-01-29 ENCOUNTER — Other Ambulatory Visit: Payer: Self-pay | Admitting: Neurology

## 2011-01-29 DIAGNOSIS — R269 Unspecified abnormalities of gait and mobility: Secondary | ICD-10-CM

## 2011-01-29 DIAGNOSIS — E1142 Type 2 diabetes mellitus with diabetic polyneuropathy: Secondary | ICD-10-CM

## 2011-01-29 DIAGNOSIS — M549 Dorsalgia, unspecified: Secondary | ICD-10-CM

## 2011-02-03 ENCOUNTER — Ambulatory Visit: Payer: Medicare Other | Admitting: Physical Therapy

## 2011-02-05 ENCOUNTER — Ambulatory Visit: Payer: Medicare Other | Admitting: Physical Therapy

## 2011-02-10 ENCOUNTER — Ambulatory Visit
Admission: RE | Admit: 2011-02-10 | Discharge: 2011-02-10 | Disposition: A | Discharge status: Home / Self Care | Payer: Medicare Other | Source: Ambulatory Visit | Attending: Neurology | Admitting: Neurology

## 2011-02-10 ENCOUNTER — Ambulatory Visit: Payer: Medicare Other | Admitting: Physical Therapy

## 2011-02-10 DIAGNOSIS — M549 Dorsalgia, unspecified: Secondary | ICD-10-CM

## 2011-02-10 DIAGNOSIS — E1142 Type 2 diabetes mellitus with diabetic polyneuropathy: Secondary | ICD-10-CM

## 2011-02-10 DIAGNOSIS — R269 Unspecified abnormalities of gait and mobility: Secondary | ICD-10-CM

## 2011-02-11 ENCOUNTER — Ambulatory Visit: Payer: Medicare Other | Admitting: Physical Therapy

## 2011-02-15 ENCOUNTER — Ambulatory Visit: Payer: Medicare Other | Admitting: Physical Therapy

## 2011-02-18 ENCOUNTER — Ambulatory Visit: Payer: Medicare Other | Admitting: Physical Therapy

## 2011-02-22 ENCOUNTER — Ambulatory Visit: Payer: Medicare Other | Admitting: Physical Therapy

## 2011-02-24 ENCOUNTER — Ambulatory Visit: Payer: Medicare Other | Admitting: Physical Therapy

## 2011-03-02 LAB — POCT I-STAT, CHEM 8
Calcium, Ion: 1.21
Chloride: 98
HCT: 35 — ABNORMAL LOW

## 2011-03-03 ENCOUNTER — Other Ambulatory Visit: Payer: Self-pay | Admitting: Sports Medicine

## 2011-03-03 DIAGNOSIS — M25511 Pain in right shoulder: Secondary | ICD-10-CM

## 2011-03-07 ENCOUNTER — Ambulatory Visit
Admission: RE | Admit: 2011-03-07 | Discharge: 2011-03-07 | Disposition: A | Discharge status: Home / Self Care | Payer: Medicare Other | Source: Ambulatory Visit | Attending: Sports Medicine | Admitting: Sports Medicine

## 2011-03-07 DIAGNOSIS — M25511 Pain in right shoulder: Secondary | ICD-10-CM

## 2011-03-16 LAB — I-STAT 8, (EC8 V) (CONVERTED LAB)
Acid-Base Excess: 2
Bicarbonate: 26.8 — ABNORMAL HIGH
Chloride: 103
HCT: 30 — ABNORMAL LOW
Operator id: 279391
TCO2: 28
pCO2, Ven: 42.4 — ABNORMAL LOW

## 2011-05-07 ENCOUNTER — Other Ambulatory Visit: Payer: Self-pay | Admitting: Orthopedic Surgery

## 2011-05-07 DIAGNOSIS — M25511 Pain in right shoulder: Secondary | ICD-10-CM

## 2011-05-08 ENCOUNTER — Other Ambulatory Visit: Payer: Medicare Other

## 2011-05-10 ENCOUNTER — Ambulatory Visit
Admission: RE | Admit: 2011-05-10 | Discharge: 2011-05-10 | Disposition: A | Discharge status: Home / Self Care | Payer: Medicare Other | Source: Ambulatory Visit | Attending: Orthopedic Surgery | Admitting: Orthopedic Surgery

## 2011-05-10 DIAGNOSIS — M25511 Pain in right shoulder: Secondary | ICD-10-CM

## 2011-06-16 DIAGNOSIS — M25519 Pain in unspecified shoulder: Secondary | ICD-10-CM | POA: Diagnosis not present

## 2011-06-16 DIAGNOSIS — M19019 Primary osteoarthritis, unspecified shoulder: Secondary | ICD-10-CM | POA: Diagnosis not present

## 2011-06-16 DIAGNOSIS — F330 Major depressive disorder, recurrent, mild: Secondary | ICD-10-CM | POA: Diagnosis not present

## 2011-06-30 DIAGNOSIS — E1039 Type 1 diabetes mellitus with other diabetic ophthalmic complication: Secondary | ICD-10-CM | POA: Diagnosis not present

## 2011-06-30 DIAGNOSIS — E11359 Type 2 diabetes mellitus with proliferative diabetic retinopathy without macular edema: Secondary | ICD-10-CM | POA: Diagnosis not present

## 2011-06-30 DIAGNOSIS — M67919 Unspecified disorder of synovium and tendon, unspecified shoulder: Secondary | ICD-10-CM | POA: Diagnosis not present

## 2011-06-30 DIAGNOSIS — M719 Bursopathy, unspecified: Secondary | ICD-10-CM | POA: Diagnosis not present

## 2011-06-30 DIAGNOSIS — Z4681 Encounter for fitting and adjustment of insulin pump: Secondary | ICD-10-CM | POA: Diagnosis not present

## 2011-07-07 DIAGNOSIS — F330 Major depressive disorder, recurrent, mild: Secondary | ICD-10-CM | POA: Diagnosis not present

## 2011-07-20 DIAGNOSIS — F3189 Other bipolar disorder: Secondary | ICD-10-CM | POA: Diagnosis not present

## 2011-07-28 DIAGNOSIS — F330 Major depressive disorder, recurrent, mild: Secondary | ICD-10-CM | POA: Diagnosis not present

## 2011-08-04 DIAGNOSIS — E1039 Type 1 diabetes mellitus with other diabetic ophthalmic complication: Secondary | ICD-10-CM | POA: Diagnosis not present

## 2011-08-04 DIAGNOSIS — I2510 Atherosclerotic heart disease of native coronary artery without angina pectoris: Secondary | ICD-10-CM | POA: Diagnosis not present

## 2011-08-04 DIAGNOSIS — R413 Other amnesia: Secondary | ICD-10-CM | POA: Diagnosis not present

## 2011-08-04 DIAGNOSIS — M67919 Unspecified disorder of synovium and tendon, unspecified shoulder: Secondary | ICD-10-CM | POA: Diagnosis not present

## 2011-08-11 DIAGNOSIS — E1139 Type 2 diabetes mellitus with other diabetic ophthalmic complication: Secondary | ICD-10-CM | POA: Diagnosis not present

## 2011-08-11 DIAGNOSIS — R413 Other amnesia: Secondary | ICD-10-CM | POA: Diagnosis not present

## 2011-08-11 DIAGNOSIS — E1039 Type 1 diabetes mellitus with other diabetic ophthalmic complication: Secondary | ICD-10-CM | POA: Diagnosis not present

## 2011-08-11 DIAGNOSIS — Z4681 Encounter for fitting and adjustment of insulin pump: Secondary | ICD-10-CM | POA: Diagnosis not present

## 2011-08-18 DIAGNOSIS — M19019 Primary osteoarthritis, unspecified shoulder: Secondary | ICD-10-CM | POA: Diagnosis not present

## 2011-08-18 DIAGNOSIS — F330 Major depressive disorder, recurrent, mild: Secondary | ICD-10-CM | POA: Diagnosis not present

## 2011-09-08 ENCOUNTER — Encounter (HOSPITAL_COMMUNITY): Payer: Self-pay | Admitting: Emergency Medicine

## 2011-09-08 ENCOUNTER — Other Ambulatory Visit: Payer: Self-pay

## 2011-09-08 ENCOUNTER — Emergency Department (HOSPITAL_COMMUNITY): Payer: Medicare Other

## 2011-09-08 ENCOUNTER — Emergency Department (HOSPITAL_COMMUNITY)
Admission: EM | Admit: 2011-09-08 | Discharge: 2011-09-08 | Disposition: A | Discharge status: Home / Self Care | Payer: Medicare Other | Attending: Emergency Medicine | Admitting: Emergency Medicine

## 2011-09-08 DIAGNOSIS — R1013 Epigastric pain: Secondary | ICD-10-CM | POA: Diagnosis not present

## 2011-09-08 DIAGNOSIS — R52 Pain, unspecified: Secondary | ICD-10-CM | POA: Diagnosis not present

## 2011-09-08 DIAGNOSIS — M546 Pain in thoracic spine: Secondary | ICD-10-CM | POA: Diagnosis not present

## 2011-09-08 DIAGNOSIS — IMO0002 Reserved for concepts with insufficient information to code with codable children: Secondary | ICD-10-CM | POA: Diagnosis not present

## 2011-09-08 DIAGNOSIS — W19XXXA Unspecified fall, initial encounter: Secondary | ICD-10-CM

## 2011-09-08 DIAGNOSIS — R739 Hyperglycemia, unspecified: Secondary | ICD-10-CM

## 2011-09-08 DIAGNOSIS — R079 Chest pain, unspecified: Secondary | ICD-10-CM | POA: Insufficient documentation

## 2011-09-08 DIAGNOSIS — T148XXA Other injury of unspecified body region, initial encounter: Secondary | ICD-10-CM | POA: Diagnosis not present

## 2011-09-08 DIAGNOSIS — K219 Gastro-esophageal reflux disease without esophagitis: Secondary | ICD-10-CM | POA: Diagnosis not present

## 2011-09-08 DIAGNOSIS — Z794 Long term (current) use of insulin: Secondary | ICD-10-CM | POA: Insufficient documentation

## 2011-09-08 DIAGNOSIS — W010XXA Fall on same level from slipping, tripping and stumbling without subsequent striking against object, initial encounter: Secondary | ICD-10-CM | POA: Insufficient documentation

## 2011-09-08 DIAGNOSIS — M8448XA Pathological fracture, other site, initial encounter for fracture: Secondary | ICD-10-CM | POA: Diagnosis not present

## 2011-09-08 DIAGNOSIS — M545 Low back pain: Secondary | ICD-10-CM | POA: Diagnosis not present

## 2011-09-08 DIAGNOSIS — M549 Dorsalgia, unspecified: Secondary | ICD-10-CM | POA: Diagnosis not present

## 2011-09-08 DIAGNOSIS — R10816 Epigastric abdominal tenderness: Secondary | ICD-10-CM | POA: Diagnosis not present

## 2011-09-08 DIAGNOSIS — I2510 Atherosclerotic heart disease of native coronary artery without angina pectoris: Secondary | ICD-10-CM | POA: Insufficient documentation

## 2011-09-08 DIAGNOSIS — E119 Type 2 diabetes mellitus without complications: Secondary | ICD-10-CM | POA: Insufficient documentation

## 2011-09-08 DIAGNOSIS — J9819 Other pulmonary collapse: Secondary | ICD-10-CM | POA: Diagnosis not present

## 2011-09-08 HISTORY — DX: Unspecified osteoarthritis, unspecified site: M19.90

## 2011-09-08 HISTORY — DX: Depression, unspecified: F32.A

## 2011-09-08 HISTORY — DX: Atherosclerotic heart disease of native coronary artery without angina pectoris: I25.10

## 2011-09-08 HISTORY — DX: Major depressive disorder, single episode, unspecified: F32.9

## 2011-09-08 LAB — URINALYSIS, ROUTINE W REFLEX MICROSCOPIC
Bilirubin Urine: NEGATIVE
Glucose, UA: >1000 mg/dL — AB
Ketones, ur: 40 mg/dL — AB
Protein, ur: NEGATIVE mg/dL

## 2011-09-08 LAB — POCT I-STAT, CHEM 8
BUN: 12 mg/dL (ref 6–23)
Calcium, Ion: 1.19 mmol/L (ref 1.12–1.32)
Chloride: 101 mEq/L (ref 96–112)
Glucose, Bld: 239 mg/dL — ABNORMAL HIGH (ref 70–99)

## 2011-09-08 NOTE — ED Provider Notes (Signed)
History     CSN: [id number]  Arrival date & time 09/08/11  1321   First MD Initiated Contact with Patient 09/08/11 1336      Chief Complaint  Patient presents with  . Fall    (Consider location/radiation/quality/duration/timing/severity/associated sxs/prior treatment) HPI  71yoF h/o IDDM, CAD presents after fall. She states she was getting up this morning to gaze to bathroom and tripped. She states she did not hit her head or have any loss of consciousness. She complains of right flank pain and pain in her back with movement. She was ambulatory after the event. Denies headache, dizziness, cp, palpitations, shortness of breath pre fall and currently. No neck pain. Denies hip pain. SHe states that she frequently has problems with her balance.    ED Notes, ED Provider Notes from 09/08/11 0000 to 09/08/11 13:31:07       Clare Charon, RN 09/08/2011 13:27      Pt aaox4         Darrin Nipper Roughgarden, RN 09/08/2011 13:26      Pt fell at 0630 this am after getting up and going to br ( got tripped up she states) has abrasion to rt flank that friend dressed when he got to her house at 0700. Pt now states hurts to move did not hit head and did not get dizzy she states pt aaives on LSB     Past Medical History  Diagnosis Date  . Cancer   . Diabetes mellitus   . Arthritis   . Coronary artery disease   . Cataract   . Acid reflux   . Depression     Past Surgical History  Procedure Date  . Abdominal hysterectomy     No family history on file.  History  Substance Use Topics  . Smoking status: Never Smoker   . Smokeless tobacco: Not on file  . Alcohol Use: Yes    OB History    Grav Para Term Preterm Abortions TAB SAB Ect Mult Living                  Review of Systems  All other systems reviewed and are negative.   except as noted HPI   Allergies  Codeine; Vibramycin; Voltaren; and Zocor  Home Medications   Current Outpatient Rx  Name Route Sig Dispense Refill    . ARIPIPRAZOLE 2 MG PO TABS Oral Take 2 mg by mouth daily.    . ASPIRIN 325 MG PO TBEC Oral Take 325 mg by mouth daily.    . BUPROPION HCL ER (SR) 150 MG PO TB12 Oral Take 450 mg by mouth 2 (two) times daily. 2 capsules in the morning and 1 at 2 pm    . CALCIUM CARBONATE 1250 MG PO TABS Oral Take 1 tablet by mouth daily.    Marland Kitchen VITAMIN D 1000 UNITS PO TABS Oral Take 2,000 Units by mouth daily.    Marland Kitchen DOCUSATE SODIUM 50 MG PO CAPS Oral Take 100 mg by mouth 2 (two) times daily.    Marland Kitchen PROZAC PO Oral Take 800 mg by mouth every morning. Patient stated its 3 tablets in the morning    . INSULIN LISPRO (HUMAN) 100 UNIT/ML [place] SOLN Subcutaneous Inject 1-50 Units into the skin 3 (three) times daily before meals. On a insulin pump    . ADULT MULTIVITAMIN W/MINERALS CH Oral Take 1 tablet by mouth daily.    Marland Kitchen OMEPRAZOLE 20 MG PO CPDR Oral Take 40 mg by mouth  daily.    Marland Kitchen ROSUVASTATIN CALCIUM 5 MG PO TABS Oral Take 5 mg by mouth daily.    . TRAMADOL HCL 50 MG PO TABS Oral Take 50 mg by mouth every 6 (six) hours as needed. For pain      BP 169/82  Pulse 82  Temp(Src) 97.6 F (36.4 C) (Oral)  Resp 18  SpO2 97%  Physical Exam  Nursing note and vitals reviewed. Constitutional: She is oriented to person, place, and time. She appears well-developed.  HENT:  Head: Atraumatic.  Mouth/Throat: Oropharynx is clear and moist.  Eyes: Conjunctivae and EOM are normal. Pupils are equal, round, and reactive to light.  Neck: Normal range of motion. Neck supple.  Cardiovascular: Normal rate, regular rhythm, normal heart sounds and intact distal pulses.   Pulmonary/Chest: Effort normal and breath sounds normal. No respiratory distress. She has no wheezes. She has no rales.  Abdominal: Soft. She exhibits no distension. There is tenderness. There is no rebound and no guarding.       Min epigastric ttp  Musculoskeletal: Normal range of motion.       +midline thoracic and lumbar ttp  Strength 4+/5 all extr  Neurological:  She is alert and oriented to person, place, and time.  Skin: Skin is warm and dry. No rash noted.       Abrasion Rt flank/back  Psychiatric: She has a normal mood and affect.    Date: 09/08/2011  Rate: 86  Rhythm: normal sinus rhythm  QRS Axis: normal  Intervals: QT prolonged 516  ST/T Wave abnormalities: normal  Conduction Disutrbances:none  Narrative Interpretation:   Old EKG Reviewed: changes noted qtc longer than previous    ED Course  Procedures (including critical care time)  Labs Reviewed  URINALYSIS, ROUTINE W REFLEX MICROSCOPIC - Abnormal; Notable for the following:    Glucose, UA >1000 (*)    Hgb urine dipstick TRACE (*)    Ketones, ur 40 (*)    All other components within normal limits  GLUCOSE, CAPILLARY - Abnormal; Notable for the following:    Glucose-Capillary 218 (*)    All other components within normal limits  POCT I-STAT, CHEM 8 - Abnormal; Notable for the following:    Glucose, Bld 239 (*)    All other components within normal limits  GLUCOSE, CAPILLARY - Abnormal; Notable for the following:    Glucose-Capillary 170 (*)    All other components within normal limits  URINE MICROSCOPIC-ADD ON   Dg Ribs Unilateral W/chest Right  09/08/2011  *RADIOLOGY REPORT*  Clinical Data: Larey Seat.  Right-sided pain.  RIGHT RIBS AND CHEST - 3+ VIEW  Comparison: 09/19/2007  Findings: There is mild left ventricular prominence.  Mediastinal shadows are unremarkable.  Lungs are clear except for mild atelectasis at the right base.  No pneumothorax or hemothorax.  Rib detail films on the right do not show any discernible fracture.  IMPRESSION: Mild right base atelectasis.  No demonstrable rib fracture.  Original Report Authenticated By: Thomasenia Sales, M.D.   Dg Thoracic Spine 2 View  09/08/2011  *RADIOLOGY REPORT*  Clinical Data: Larey Seat.  Back pain.  THORACIC SPINE - 2 VIEW  Comparison: MRI 02/10/2011.  Findings:  There is an old compression deformity at T9 with loss of height of about  40%.  No new fractures seen.  There is old minor superior endplate deformity at T12.  Posteromedial ribs appear negative.  Impression: No acute fracture demonstrated in the thoracic region. Old partial compression fracture at T9  and minimal depression of the superior endplate of T12.  Original Report Authenticated By: Thomasenia Sales, M.D.   Dg Lumbar Spine Complete  09/08/2011  *RADIOLOGY REPORT*  Clinical Data: Larey Seat.  Back pain.  LUMBAR SPINE - COMPLETE 4+ VIEW  Comparison: MRI 02/10/2011  Findings: There is mild curvature convex to the left.  There is old vertebral augmentation at L1.  Old minor superior end plate depression at T12 and L2 appears the same.  Old minor loss of height that L5 appears the same.  No acute fracture.  There is mild lower lumbar facet degeneration.  IMPRESSION: No acute finding.  Old previously augmented fracture at L1.  Old minor end plate deformities at T12, L2 and L5.  Original Report Authenticated By: Thomasenia Sales, M.D.     1. Fall   2. Hyperglycemia   3. Back pain     MDM  S/p likely mechanical fall with R flank/back abrasion. XR negative. Screening labs, UA, ekg unremarkable. Will discharge home with precautions for return. She should take her insulin as prescribed. Ambulatory in ED. Given precautions for return.        Forbes Cellar, MD 09/08/11 1714

## 2011-09-08 NOTE — ED Notes (Signed)
Pt aaox4.

## 2011-09-08 NOTE — ED Notes (Signed)
Pt fell at 0630 this am after getting up and going to br ( got tripped up she states) has abrasion to rt flank that friend dressed when he got to her house at 0700. Pt now states hurts to move did not hit head and did not get dizzy she states pt aaives on LSB

## 2011-09-08 NOTE — ED Notes (Signed)
Pt ambulated in the hallway, no difficulty. Reports some mild pain with walking.

## 2011-09-08 NOTE — Discharge Instructions (Signed)
Abrasions Abrasions are skin scrapes. Their treatment depends on how large and deep the abrasion is. Abrasions do not extend through all layers of the skin. A cut or lesion through all skin layers is called a laceration. HOME CARE INSTRUCTIONS   If you were given a dressing, change it at least once a day or as instructed by your caregiver. If the bandage sticks, soak it off with a solution of water or hydrogen peroxide.   Twice a day, wash the area with soap and water to remove all the cream/ointment. You may do this in a sink, under a tub faucet, or in a shower. Rinse off the soap and pat dry with a clean towel. Look for signs of infection (see below).   Reapply cream/ointment according to your caregiver's instruction. This will help prevent infection and keep the bandage from sticking. Telfa or gauze over the wound and under the dressing or wrap will also help keep the bandage from sticking.   If the bandage becomes wet, dirty, or develops a foul smell, change it as soon as possible.   Only take over-the-counter or prescription medicines for pain, discomfort, or fever as directed by your caregiver.  SEEK IMMEDIATE MEDICAL CARE IF:   Increasing pain in the wound.   Signs of infection develop: redness, swelling, surrounding area is tender to touch, or pus coming from the wound.   You have a fever.   Any foul smell coming from the wound or dressing.  Most skin wounds heal within ten days. Facial wounds heal faster. However, an infection may occur despite proper treatment. You should have the wound checked for signs of infection within 24 to 48 hours or sooner if problems arise. If you were not given a wound-check appointment, look closely at the wound yourself on the second day for early signs of infection listed above. MAKE SURE YOU:   Understand these instructions.   Will watch your condition.   Will get help right away if you are not doing well or get worse.  Document Released:  03/03/2005 Document Revised: 05/13/2011 Document Reviewed: 04/27/2011 [facility] Patient Information 2012 [place], [place].  Home Safety and Preventing Falls Falls are a leading cause of injury and while they affect all age groups, falls have greater short-term and long-term impact on older age groups. However, falls should not be a part of life or aging. It is possible for individuals and their families to use preventive measures to significantly decrease the likelihood that anyone, especially an older adult, will fall. There are many simple measures which can make your home safer with respect to preventing falls. The following actions can help reduce falls among all members of your family and are especially important as you age, when your balance, lower limb strength, coordination, and eyesight may be declining. The use of preventive measures will help to reduce you and your family's risk of falls and serious medical consequences. OUTDOORS  Repair cracks and edges of walkways and driveways.   Remove high doorway thresholds and trim shrubbery on the main path into your home.   Ensure there is good outside lighting at main entrances and along main walkways.   Clear walkways of tools, rocks, debris, and clutter.   Check that handrails are not broken and are securely fastened. Both sides of steps should have handrails.   In the garage, be attentive to and clean up grease or oil spills on the cement. This can make the surface extremely slippery.   In winter,  have leaves, snow, and ice cleared regularly.   Use sand or salt on walkways during winter months.  BATHROOM  Install grab bars by the toilet and in the tub and shower.   Use non-skid mats or decals in the tub or shower.   If unable to easily stand unsupported while showering, place a plastic non slip stool in the shower to sit on when needed.   Install night lights.   Keep floors dry and clean up all water on the floor immediately.     Remove soap buildup in tub or shower on a regular basis.   Secure bath mats with non-slip, double-sided rug tape.   Remove tripping hazards from the floors.  BEDROOMS  Install night lights.   Do not use oversized bedding.   Make sure a bedside light is easy to reach.   Keep a telephone by your bedside.   Make sure that you can get in and out of your bed easily.   Have a firm chair, with side arms, to use for getting dressed.   Remove clutter from around closets.   Store clothing, bed coverings, and other household items where you can reach them comfortably.   Remove tripping hazards from the floor.  LIVING AREAS AND STAIRWAYS  Turn on lights to avoid having to walk through dark areas.   Keep lighting uniform in each room. Place brighter lightbulbs in darker areas, including stairways.   Replace lightbulbs that burn out in stairways immediately.   Arrange furniture to provide for clear pathways.   Keep furniture in the same place.   Eliminate or tape down electrical cables in high traffic areas.   Place handrails on both sides of stairways. Use handrails when going up or down stairs.   Most falls occur on the top or bottom 3 steps.   Fix any loose handrails. Make sure handrails on both sides of the stairways are as long as the stairs.   Remove all walkway obstacles.   Coil or tape electrical cords off to the side of walking areas and out of the way. If using many extension cords, have an electrician put in a new wall outlet to reduce or eliminate them.   Make sure spills are cleaned up quickly and allow time for drying before walking on freshly cleaned floors.   Firmly attach carpet with non-skid or two-sided tape.   Keep frequently used items within easy reach.   Remove tripping hazards such as throw rugs and clutter in walkways. Never leave objects on stairs.   Get rid of throw rugs elsewhere if possible.   Eliminate uneven floor surfaces.   Make sure  couches and chairs are easy to get into and out of.   Check carpeting to make sure it is firmly attached along stairs.   Make repairs to worn or loose carpet promptly.   Select a carpet pattern that does not visually hide the edge of steps.   Avoid placing throw rugs or scatter rugs at the top or bottom of stairways, or properly secure with carpet tape to prevent slippage.   Have an electrician put in a light switch at the top and bottom of the stairs.   Get light switches that glow.   Avoid the following practices: hurrying, inattention, obscured vision, carrying large loads, and wearing slip-on shoes.   Be aware of all pets.  KITCHEN  Place items that are used frequently, such as dishes and food, within easy reach.   Keep  handles on pots and pans toward the center of the stove. Use back burners when possible.   Make sure spills are cleaned up quickly and allow time for drying.   Avoid walking on wet floors.   Avoid hot utensils and knives.   Position shelves so they are not too high or low.   Place commonly used objects within easy reach.   If necessary, use a sturdy step stool with a grab bar when reaching.   Make sure electrical cables are out of the way.   Do not use floor polish or wax that makes floors slippery.  OTHER HOME FALL PREVENTION STRATEGIES  Wear low heel or rubber sole shoes that are supportive and fit well.   Wear closed toe shoes.   Know and watch for side effects of medications. Have your caregiver or pharmacist look at all your medicines, even over-the-counter medicines. Some medicines can make you sleepy or dizzy.   Exercise regularly. Exercise makes you stronger and improves your balance and coordination.   Limit use of alcohol.   Use eyeglasses if necessary and keep them clean. Have your vision checked every year.   Organize your household in a manner that minimizes the need to walk distances when hurried, or go up and down stairs  unnecessarily. For example, have a phone placed on at least each floor of your home. If possible, have a phone beside each sitting or lying area where you spend the most time at home. Keep emergency numbers posted at all phones.   Use non-skid floor wax.   When using a ladder, make sure:   The base is firm.   All ladder feet are on level ground.   The ladder is angled against the wall properly.   When climbing a ladder, face the ladder and hold the ladder rungs firmly.   If reaching, always keep your hips and body weight centered between the rails.   When using a stepladder, make sure it is fully opened and both spreaders are firmly locked.   Do not climb a closed stepladder.   Avoid climbing beyond the second step from the top of a stepladder and the 4th rung from the top of an extension ladder.   Learn and use mobility aids as needed.   Change positions slowly. Arise slowly from sitting and lying positions. Sit on the edge of your bed before getting to your feet.   If you have a history of falls, ask someone to add color or contrast paint or tape to grab bars and handrails in your home.   If you have a history of falls, ask someone to place contrasting color strips on first and last steps.   Install an electrical emergency response system if you need one, and know how to use it.   If you have a medical or other condition that causes you to have limited physical strength, it is important that you reach out to family and friends for occasional help.  FOR CHILDREN:  If young children are in the home, use safety gates. At the top of stairs use screw-mounted gates; use pressure-mounted gates for the bottom of the stairs and doorways between rooms.   Young children should be taught to descend stairs on their stomachs, feet first, and later using the handrail.   Keep drawers fully closed to prevent them from being climbed on or pulled out entirely.   Move chairs, cribs, beds and  other furniture away from windows.  Consider installing window guards on windows ground floor and up, unless they are emergency fire exits. Make sure they have easy release mechanisms.   Consider installing special locks that only allow the window to be opened to a certain height.   Never rely on window screens to prevent falls.   Never leave babies alone on changing tables, beds or sofas. Use a changing table that has a restraining strap.   When a child can pull to a standing position, the crib mattress should be adjusted to its lowest position. There should be at least 26 inches between the top rails of the crib drop side and the mattress. Toys, bumper pads, and other objects that can be used as steps to climb out should be removed from the crib.   On bunk beds never allow a child under age 39 to sleep on the top bunk. For older children, if the upper bunk is not against a wall, use guard rails on both sides. No matter how old a child is, keep the guard rails in place on the top bunk since children roll during sleep. Do not permit horseplay on bunks.   Grass and soil surfaces beneath backyard playground equipment should be replaced with hardwood chips, shredded wood mulch, sand, pea gravel, rubber, crushed stone, or another safer material at depths of at least 9 to 12 inches.   When riding bikes or using skates, skateboards, skis, or snowboards, require children to wear helmets. Look for those that have stickers stating that they meet or exceed safety standards.   Vertical posts or pickets in deck, balcony, and stairway railings should be no more than 3 1/2 inches apart if a young baby will have access to the area. The space between horizontal rails or bars, and between the floor and the first horizontal rail or bar, should be no more than 3 1/2 inches.  Document Released: 05/14/2002 Document Revised: 05/13/2011 Document Reviewed: 03/13/2009 [facility] Patient Information 2012 [place],  [place].  RESOURCE GUIDE  Dental Problems  Patients with Medicaid: [facility] [phone] [address]                                           [phone] [address] Phone:  [phone]                                                   Phone:  [phone]  If unable to pay or uninsured, contact:  [facility] or [facility]. to become qualified for the adult dental clinic.  Chronic Pain Problems Contact [facility]  [phone] Patients need to be referred by their primary care doctor.  Insufficient Money for Medicine Contact [employer]:  call "211" or [facility] [phone].  No Primary Care Doctor Call [facility]  [phone] Other agencies that provide inexpensive medical care    [facility]  [phone]    [facility]  [phone]    [facility]  [phone]    [facility]  [phone]    [facility]  [phone]  [facility]  [phone]  Psychological Services [facility]  [phone] [facility]  [phone] [facility] Mental Health   [phone] (emergency services [phone])  Abuse/Neglect [facility] Child Abuse Hotline 626-663-3608 [facility] Child Abuse Hotline 340-211-4775 (After Hours)  Emergency Shelter [facility] 2131226391  [facility] [place] [facility] [place] 6713313087 [place] Alert Services [phone]  MRSA Hotline #:   5414849383    [facility] Resources  [facility]  [employer]                           [facility] Dept. [address] [place]                     [address]         [address]  Blondell Reveal Phone:  [phone]                                  Phone:  [phone]                   Phone:   [phone]  [facility] Mental Health Phone:  [phone]  [facility] Child Abuse Hotline [phone] (After Hours)

## 2011-09-22 DIAGNOSIS — M25519 Pain in unspecified shoulder: Secondary | ICD-10-CM | POA: Diagnosis not present

## 2011-09-30 DIAGNOSIS — E1039 Type 1 diabetes mellitus with other diabetic ophthalmic complication: Secondary | ICD-10-CM | POA: Diagnosis not present

## 2011-09-30 DIAGNOSIS — Z4681 Encounter for fitting and adjustment of insulin pump: Secondary | ICD-10-CM | POA: Diagnosis not present

## 2011-09-30 DIAGNOSIS — R413 Other amnesia: Secondary | ICD-10-CM | POA: Diagnosis not present

## 2011-09-30 DIAGNOSIS — F330 Major depressive disorder, recurrent, mild: Secondary | ICD-10-CM | POA: Diagnosis not present

## 2011-10-05 DIAGNOSIS — H409 Unspecified glaucoma: Secondary | ICD-10-CM | POA: Diagnosis not present

## 2011-10-05 DIAGNOSIS — H18839 Recurrent erosion of cornea, unspecified eye: Secondary | ICD-10-CM | POA: Diagnosis not present

## 2011-10-05 DIAGNOSIS — H4011X0 Primary open-angle glaucoma, stage unspecified: Secondary | ICD-10-CM | POA: Diagnosis not present

## 2011-10-05 DIAGNOSIS — Z961 Presence of intraocular lens: Secondary | ICD-10-CM | POA: Diagnosis not present

## 2011-10-07 DIAGNOSIS — H538 Other visual disturbances: Secondary | ICD-10-CM | POA: Diagnosis not present

## 2011-10-07 DIAGNOSIS — E11359 Type 2 diabetes mellitus with proliferative diabetic retinopathy without macular edema: Secondary | ICD-10-CM | POA: Diagnosis not present

## 2011-10-07 DIAGNOSIS — E1139 Type 2 diabetes mellitus with other diabetic ophthalmic complication: Secondary | ICD-10-CM | POA: Diagnosis not present

## 2011-10-28 DIAGNOSIS — Z803 Family history of malignant neoplasm of breast: Secondary | ICD-10-CM | POA: Diagnosis not present

## 2011-10-28 DIAGNOSIS — Z1231 Encounter for screening mammogram for malignant neoplasm of breast: Secondary | ICD-10-CM | POA: Diagnosis not present

## 2011-12-03 DIAGNOSIS — R413 Other amnesia: Secondary | ICD-10-CM | POA: Diagnosis not present

## 2011-12-03 DIAGNOSIS — R269 Unspecified abnormalities of gait and mobility: Secondary | ICD-10-CM | POA: Diagnosis not present

## 2011-12-03 DIAGNOSIS — E1039 Type 1 diabetes mellitus with other diabetic ophthalmic complication: Secondary | ICD-10-CM | POA: Diagnosis not present

## 2011-12-03 DIAGNOSIS — E11359 Type 2 diabetes mellitus with proliferative diabetic retinopathy without macular edema: Secondary | ICD-10-CM | POA: Diagnosis not present

## 2011-12-06 DIAGNOSIS — F330 Major depressive disorder, recurrent, mild: Secondary | ICD-10-CM | POA: Diagnosis not present

## 2011-12-27 DIAGNOSIS — F330 Major depressive disorder, recurrent, mild: Secondary | ICD-10-CM | POA: Diagnosis not present

## 2012-01-03 DIAGNOSIS — F3189 Other bipolar disorder: Secondary | ICD-10-CM | POA: Diagnosis not present

## 2012-01-11 DIAGNOSIS — M25579 Pain in unspecified ankle and joints of unspecified foot: Secondary | ICD-10-CM | POA: Diagnosis not present

## 2012-01-17 DIAGNOSIS — L259 Unspecified contact dermatitis, unspecified cause: Secondary | ICD-10-CM | POA: Diagnosis not present

## 2012-01-17 DIAGNOSIS — F330 Major depressive disorder, recurrent, mild: Secondary | ICD-10-CM | POA: Diagnosis not present

## 2012-01-17 DIAGNOSIS — L219 Seborrheic dermatitis, unspecified: Secondary | ICD-10-CM | POA: Diagnosis not present

## 2012-02-23 DIAGNOSIS — R61 Generalized hyperhidrosis: Secondary | ICD-10-CM | POA: Diagnosis not present

## 2012-02-23 DIAGNOSIS — R7301 Impaired fasting glucose: Secondary | ICD-10-CM | POA: Diagnosis not present

## 2012-02-23 DIAGNOSIS — I6789 Other cerebrovascular disease: Secondary | ICD-10-CM | POA: Diagnosis not present

## 2012-03-07 DIAGNOSIS — F330 Major depressive disorder, recurrent, mild: Secondary | ICD-10-CM | POA: Diagnosis not present

## 2012-03-09 DIAGNOSIS — R7301 Impaired fasting glucose: Secondary | ICD-10-CM | POA: Diagnosis not present

## 2012-03-09 DIAGNOSIS — E1169 Type 2 diabetes mellitus with other specified complication: Secondary | ICD-10-CM | POA: Diagnosis not present

## 2012-03-22 DIAGNOSIS — F3189 Other bipolar disorder: Secondary | ICD-10-CM | POA: Diagnosis not present

## 2012-03-28 DIAGNOSIS — G459 Transient cerebral ischemic attack, unspecified: Secondary | ICD-10-CM | POA: Diagnosis not present

## 2012-03-28 DIAGNOSIS — E785 Hyperlipidemia, unspecified: Secondary | ICD-10-CM | POA: Diagnosis not present

## 2012-03-28 DIAGNOSIS — E1039 Type 1 diabetes mellitus with other diabetic ophthalmic complication: Secondary | ICD-10-CM | POA: Diagnosis not present

## 2012-03-28 DIAGNOSIS — M810 Age-related osteoporosis without current pathological fracture: Secondary | ICD-10-CM | POA: Diagnosis not present

## 2012-04-03 DIAGNOSIS — F3189 Other bipolar disorder: Secondary | ICD-10-CM | POA: Diagnosis not present

## 2012-04-07 DIAGNOSIS — H409 Unspecified glaucoma: Secondary | ICD-10-CM | POA: Diagnosis not present

## 2012-04-07 DIAGNOSIS — H4011X0 Primary open-angle glaucoma, stage unspecified: Secondary | ICD-10-CM | POA: Diagnosis not present

## 2012-04-07 DIAGNOSIS — Z961 Presence of intraocular lens: Secondary | ICD-10-CM | POA: Diagnosis not present

## 2012-04-26 DIAGNOSIS — F3189 Other bipolar disorder: Secondary | ICD-10-CM | POA: Diagnosis not present

## 2012-05-02 DIAGNOSIS — E1039 Type 1 diabetes mellitus with other diabetic ophthalmic complication: Secondary | ICD-10-CM | POA: Diagnosis not present

## 2012-06-13 DIAGNOSIS — F3189 Other bipolar disorder: Secondary | ICD-10-CM | POA: Diagnosis not present

## 2012-06-20 DIAGNOSIS — F3189 Other bipolar disorder: Secondary | ICD-10-CM | POA: Diagnosis not present

## 2012-07-13 DIAGNOSIS — E11359 Type 2 diabetes mellitus with proliferative diabetic retinopathy without macular edema: Secondary | ICD-10-CM | POA: Diagnosis not present

## 2012-07-13 DIAGNOSIS — E1039 Type 1 diabetes mellitus with other diabetic ophthalmic complication: Secondary | ICD-10-CM | POA: Diagnosis not present

## 2012-07-28 DIAGNOSIS — M7500 Adhesive capsulitis of unspecified shoulder: Secondary | ICD-10-CM | POA: Diagnosis not present

## 2012-08-01 DIAGNOSIS — R42 Dizziness and giddiness: Secondary | ICD-10-CM | POA: Diagnosis not present

## 2012-08-01 DIAGNOSIS — E1039 Type 1 diabetes mellitus with other diabetic ophthalmic complication: Secondary | ICD-10-CM | POA: Diagnosis not present

## 2012-08-01 DIAGNOSIS — M7500 Adhesive capsulitis of unspecified shoulder: Secondary | ICD-10-CM | POA: Diagnosis not present

## 2012-08-01 DIAGNOSIS — R413 Other amnesia: Secondary | ICD-10-CM | POA: Diagnosis not present

## 2012-08-01 DIAGNOSIS — G459 Transient cerebral ischemic attack, unspecified: Secondary | ICD-10-CM | POA: Diagnosis not present

## 2012-08-07 DIAGNOSIS — M7500 Adhesive capsulitis of unspecified shoulder: Secondary | ICD-10-CM | POA: Diagnosis not present

## 2012-08-09 DIAGNOSIS — F3189 Other bipolar disorder: Secondary | ICD-10-CM | POA: Diagnosis not present

## 2012-08-10 DIAGNOSIS — M7500 Adhesive capsulitis of unspecified shoulder: Secondary | ICD-10-CM | POA: Diagnosis not present

## 2012-08-14 DIAGNOSIS — M7500 Adhesive capsulitis of unspecified shoulder: Secondary | ICD-10-CM | POA: Diagnosis not present

## 2012-08-17 DIAGNOSIS — M7500 Adhesive capsulitis of unspecified shoulder: Secondary | ICD-10-CM | POA: Diagnosis not present

## 2012-08-24 DIAGNOSIS — M7500 Adhesive capsulitis of unspecified shoulder: Secondary | ICD-10-CM | POA: Diagnosis not present

## 2012-08-25 DIAGNOSIS — M7500 Adhesive capsulitis of unspecified shoulder: Secondary | ICD-10-CM | POA: Diagnosis not present

## 2012-08-28 DIAGNOSIS — M7500 Adhesive capsulitis of unspecified shoulder: Secondary | ICD-10-CM | POA: Diagnosis not present

## 2012-08-31 DIAGNOSIS — M7500 Adhesive capsulitis of unspecified shoulder: Secondary | ICD-10-CM | POA: Diagnosis not present

## 2012-09-04 DIAGNOSIS — M7500 Adhesive capsulitis of unspecified shoulder: Secondary | ICD-10-CM | POA: Diagnosis not present

## 2012-09-05 DIAGNOSIS — F3189 Other bipolar disorder: Secondary | ICD-10-CM | POA: Diagnosis not present

## 2012-09-07 DIAGNOSIS — M7500 Adhesive capsulitis of unspecified shoulder: Secondary | ICD-10-CM | POA: Diagnosis not present

## 2012-09-11 DIAGNOSIS — M7500 Adhesive capsulitis of unspecified shoulder: Secondary | ICD-10-CM | POA: Diagnosis not present

## 2012-09-19 DIAGNOSIS — F3189 Other bipolar disorder: Secondary | ICD-10-CM | POA: Diagnosis not present

## 2012-09-25 DIAGNOSIS — M7500 Adhesive capsulitis of unspecified shoulder: Secondary | ICD-10-CM | POA: Diagnosis not present

## 2012-09-28 DIAGNOSIS — F3189 Other bipolar disorder: Secondary | ICD-10-CM | POA: Diagnosis not present

## 2012-10-05 DIAGNOSIS — M7500 Adhesive capsulitis of unspecified shoulder: Secondary | ICD-10-CM | POA: Diagnosis not present

## 2012-10-19 DIAGNOSIS — F3189 Other bipolar disorder: Secondary | ICD-10-CM | POA: Diagnosis not present

## 2012-11-06 DIAGNOSIS — Z1231 Encounter for screening mammogram for malignant neoplasm of breast: Secondary | ICD-10-CM | POA: Diagnosis not present

## 2012-11-13 DIAGNOSIS — F3189 Other bipolar disorder: Secondary | ICD-10-CM | POA: Diagnosis not present

## 2012-11-20 DIAGNOSIS — R413 Other amnesia: Secondary | ICD-10-CM | POA: Diagnosis not present

## 2012-11-20 DIAGNOSIS — E1039 Type 1 diabetes mellitus with other diabetic ophthalmic complication: Secondary | ICD-10-CM | POA: Diagnosis not present

## 2012-11-20 DIAGNOSIS — E785 Hyperlipidemia, unspecified: Secondary | ICD-10-CM | POA: Diagnosis not present

## 2012-11-20 DIAGNOSIS — G459 Transient cerebral ischemic attack, unspecified: Secondary | ICD-10-CM | POA: Diagnosis not present

## 2012-11-20 DIAGNOSIS — E11359 Type 2 diabetes mellitus with proliferative diabetic retinopathy without macular edema: Secondary | ICD-10-CM | POA: Diagnosis not present

## 2012-11-20 DIAGNOSIS — I2510 Atherosclerotic heart disease of native coronary artery without angina pectoris: Secondary | ICD-10-CM | POA: Diagnosis not present

## 2012-11-20 DIAGNOSIS — R269 Unspecified abnormalities of gait and mobility: Secondary | ICD-10-CM | POA: Diagnosis not present

## 2012-11-20 DIAGNOSIS — E1142 Type 2 diabetes mellitus with diabetic polyneuropathy: Secondary | ICD-10-CM | POA: Diagnosis not present

## 2012-11-21 DIAGNOSIS — F3189 Other bipolar disorder: Secondary | ICD-10-CM | POA: Diagnosis not present

## 2012-12-25 DIAGNOSIS — F3189 Other bipolar disorder: Secondary | ICD-10-CM | POA: Diagnosis not present

## 2013-02-06 DIAGNOSIS — F3189 Other bipolar disorder: Secondary | ICD-10-CM | POA: Diagnosis not present

## 2013-02-26 DIAGNOSIS — E1039 Type 1 diabetes mellitus with other diabetic ophthalmic complication: Secondary | ICD-10-CM | POA: Diagnosis not present

## 2013-02-26 DIAGNOSIS — E1142 Type 2 diabetes mellitus with diabetic polyneuropathy: Secondary | ICD-10-CM | POA: Diagnosis not present

## 2013-02-26 DIAGNOSIS — Z23 Encounter for immunization: Secondary | ICD-10-CM | POA: Diagnosis not present

## 2013-02-26 DIAGNOSIS — M810 Age-related osteoporosis without current pathological fracture: Secondary | ICD-10-CM | POA: Diagnosis not present

## 2013-02-26 DIAGNOSIS — E785 Hyperlipidemia, unspecified: Secondary | ICD-10-CM | POA: Diagnosis not present

## 2013-02-26 DIAGNOSIS — I2510 Atherosclerotic heart disease of native coronary artery without angina pectoris: Secondary | ICD-10-CM | POA: Diagnosis not present

## 2013-02-26 DIAGNOSIS — R269 Unspecified abnormalities of gait and mobility: Secondary | ICD-10-CM | POA: Diagnosis not present

## 2013-02-26 DIAGNOSIS — R413 Other amnesia: Secondary | ICD-10-CM | POA: Diagnosis not present

## 2013-02-26 DIAGNOSIS — E11359 Type 2 diabetes mellitus with proliferative diabetic retinopathy without macular edema: Secondary | ICD-10-CM | POA: Diagnosis not present

## 2013-02-27 DIAGNOSIS — F3189 Other bipolar disorder: Secondary | ICD-10-CM | POA: Diagnosis not present

## 2013-03-21 DIAGNOSIS — F3189 Other bipolar disorder: Secondary | ICD-10-CM | POA: Diagnosis not present

## 2013-03-27 DIAGNOSIS — H35379 Puckering of macula, unspecified eye: Secondary | ICD-10-CM | POA: Diagnosis not present

## 2013-03-27 DIAGNOSIS — E11359 Type 2 diabetes mellitus with proliferative diabetic retinopathy without macular edema: Secondary | ICD-10-CM | POA: Diagnosis not present

## 2013-03-27 DIAGNOSIS — E1039 Type 1 diabetes mellitus with other diabetic ophthalmic complication: Secondary | ICD-10-CM | POA: Diagnosis not present

## 2013-03-27 DIAGNOSIS — H4720 Unspecified optic atrophy: Secondary | ICD-10-CM | POA: Diagnosis not present

## 2013-04-06 DIAGNOSIS — I2510 Atherosclerotic heart disease of native coronary artery without angina pectoris: Secondary | ICD-10-CM | POA: Diagnosis not present

## 2013-04-06 DIAGNOSIS — Z85828 Personal history of other malignant neoplasm of skin: Secondary | ICD-10-CM | POA: Diagnosis not present

## 2013-04-06 DIAGNOSIS — Z4681 Encounter for fitting and adjustment of insulin pump: Secondary | ICD-10-CM | POA: Diagnosis not present

## 2013-04-06 DIAGNOSIS — L821 Other seborrheic keratosis: Secondary | ICD-10-CM | POA: Diagnosis not present

## 2013-04-06 DIAGNOSIS — R413 Other amnesia: Secondary | ICD-10-CM | POA: Diagnosis not present

## 2013-04-06 DIAGNOSIS — L738 Other specified follicular disorders: Secondary | ICD-10-CM | POA: Diagnosis not present

## 2013-04-06 DIAGNOSIS — E1039 Type 1 diabetes mellitus with other diabetic ophthalmic complication: Secondary | ICD-10-CM | POA: Diagnosis not present

## 2013-04-10 DIAGNOSIS — E11359 Type 2 diabetes mellitus with proliferative diabetic retinopathy without macular edema: Secondary | ICD-10-CM | POA: Diagnosis not present

## 2013-04-10 DIAGNOSIS — E1139 Type 2 diabetes mellitus with other diabetic ophthalmic complication: Secondary | ICD-10-CM | POA: Diagnosis not present

## 2013-04-10 DIAGNOSIS — Z961 Presence of intraocular lens: Secondary | ICD-10-CM | POA: Diagnosis not present

## 2013-04-10 DIAGNOSIS — H4011X0 Primary open-angle glaucoma, stage unspecified: Secondary | ICD-10-CM | POA: Diagnosis not present

## 2013-04-18 DIAGNOSIS — E1142 Type 2 diabetes mellitus with diabetic polyneuropathy: Secondary | ICD-10-CM | POA: Diagnosis not present

## 2013-04-18 DIAGNOSIS — M109 Gout, unspecified: Secondary | ICD-10-CM | POA: Diagnosis not present

## 2013-04-24 DIAGNOSIS — F3189 Other bipolar disorder: Secondary | ICD-10-CM | POA: Diagnosis not present

## 2013-04-26 DIAGNOSIS — E1149 Type 2 diabetes mellitus with other diabetic neurological complication: Secondary | ICD-10-CM | POA: Diagnosis not present

## 2013-04-26 DIAGNOSIS — M79609 Pain in unspecified limb: Secondary | ICD-10-CM | POA: Diagnosis not present

## 2013-04-27 DIAGNOSIS — M899 Disorder of bone, unspecified: Secondary | ICD-10-CM | POA: Diagnosis not present

## 2013-04-27 DIAGNOSIS — M19079 Primary osteoarthritis, unspecified ankle and foot: Secondary | ICD-10-CM | POA: Diagnosis not present

## 2013-04-30 DIAGNOSIS — F3189 Other bipolar disorder: Secondary | ICD-10-CM | POA: Diagnosis not present

## 2013-06-08 DIAGNOSIS — R413 Other amnesia: Secondary | ICD-10-CM | POA: Diagnosis not present

## 2013-06-08 DIAGNOSIS — E11359 Type 2 diabetes mellitus with proliferative diabetic retinopathy without macular edema: Secondary | ICD-10-CM | POA: Diagnosis not present

## 2013-06-08 DIAGNOSIS — E1039 Type 1 diabetes mellitus with other diabetic ophthalmic complication: Secondary | ICD-10-CM | POA: Diagnosis not present

## 2013-06-08 DIAGNOSIS — M109 Gout, unspecified: Secondary | ICD-10-CM | POA: Diagnosis not present

## 2013-06-08 DIAGNOSIS — E785 Hyperlipidemia, unspecified: Secondary | ICD-10-CM | POA: Diagnosis not present

## 2013-06-08 DIAGNOSIS — Z1331 Encounter for screening for depression: Secondary | ICD-10-CM | POA: Diagnosis not present

## 2013-06-08 DIAGNOSIS — I2510 Atherosclerotic heart disease of native coronary artery without angina pectoris: Secondary | ICD-10-CM | POA: Diagnosis not present

## 2013-06-08 DIAGNOSIS — R269 Unspecified abnormalities of gait and mobility: Secondary | ICD-10-CM | POA: Diagnosis not present

## 2013-06-25 DIAGNOSIS — F3189 Other bipolar disorder: Secondary | ICD-10-CM | POA: Diagnosis not present

## 2013-07-10 DIAGNOSIS — B009 Herpesviral infection, unspecified: Secondary | ICD-10-CM | POA: Diagnosis not present

## 2013-07-10 DIAGNOSIS — Z85828 Personal history of other malignant neoplasm of skin: Secondary | ICD-10-CM | POA: Diagnosis not present

## 2013-08-07 DIAGNOSIS — F3189 Other bipolar disorder: Secondary | ICD-10-CM | POA: Diagnosis not present

## 2013-08-08 DIAGNOSIS — F3189 Other bipolar disorder: Secondary | ICD-10-CM | POA: Diagnosis not present

## 2013-09-11 DIAGNOSIS — M109 Gout, unspecified: Secondary | ICD-10-CM | POA: Diagnosis not present

## 2013-09-11 DIAGNOSIS — I2510 Atherosclerotic heart disease of native coronary artery without angina pectoris: Secondary | ICD-10-CM | POA: Diagnosis not present

## 2013-09-11 DIAGNOSIS — F329 Major depressive disorder, single episode, unspecified: Secondary | ICD-10-CM | POA: Diagnosis not present

## 2013-09-11 DIAGNOSIS — R413 Other amnesia: Secondary | ICD-10-CM | POA: Diagnosis not present

## 2013-09-11 DIAGNOSIS — E785 Hyperlipidemia, unspecified: Secondary | ICD-10-CM | POA: Diagnosis not present

## 2013-09-11 DIAGNOSIS — E1142 Type 2 diabetes mellitus with diabetic polyneuropathy: Secondary | ICD-10-CM | POA: Diagnosis not present

## 2013-09-11 DIAGNOSIS — Z79899 Other long term (current) drug therapy: Secondary | ICD-10-CM | POA: Diagnosis not present

## 2013-09-11 DIAGNOSIS — F3289 Other specified depressive episodes: Secondary | ICD-10-CM | POA: Diagnosis not present

## 2013-09-11 DIAGNOSIS — E1039 Type 1 diabetes mellitus with other diabetic ophthalmic complication: Secondary | ICD-10-CM | POA: Diagnosis not present

## 2013-09-11 DIAGNOSIS — E11359 Type 2 diabetes mellitus with proliferative diabetic retinopathy without macular edema: Secondary | ICD-10-CM | POA: Diagnosis not present

## 2013-09-17 DIAGNOSIS — F3189 Other bipolar disorder: Secondary | ICD-10-CM | POA: Diagnosis not present

## 2013-09-19 DIAGNOSIS — H905 Unspecified sensorineural hearing loss: Secondary | ICD-10-CM | POA: Diagnosis not present

## 2013-09-19 DIAGNOSIS — M26609 Unspecified temporomandibular joint disorder, unspecified side: Secondary | ICD-10-CM | POA: Diagnosis not present

## 2013-09-19 DIAGNOSIS — H9209 Otalgia, unspecified ear: Secondary | ICD-10-CM | POA: Diagnosis not present

## 2013-11-06 DIAGNOSIS — F3189 Other bipolar disorder: Secondary | ICD-10-CM | POA: Diagnosis not present

## 2013-11-08 DIAGNOSIS — F3189 Other bipolar disorder: Secondary | ICD-10-CM | POA: Diagnosis not present

## 2013-11-19 DIAGNOSIS — Z1231 Encounter for screening mammogram for malignant neoplasm of breast: Secondary | ICD-10-CM | POA: Diagnosis not present

## 2013-12-24 DIAGNOSIS — E11359 Type 2 diabetes mellitus with proliferative diabetic retinopathy without macular edema: Secondary | ICD-10-CM | POA: Diagnosis not present

## 2013-12-24 DIAGNOSIS — E1139 Type 2 diabetes mellitus with other diabetic ophthalmic complication: Secondary | ICD-10-CM | POA: Diagnosis not present

## 2013-12-24 DIAGNOSIS — H35379 Puckering of macula, unspecified eye: Secondary | ICD-10-CM | POA: Diagnosis not present

## 2014-01-14 DIAGNOSIS — F3289 Other specified depressive episodes: Secondary | ICD-10-CM | POA: Diagnosis not present

## 2014-01-14 DIAGNOSIS — E1039 Type 1 diabetes mellitus with other diabetic ophthalmic complication: Secondary | ICD-10-CM | POA: Diagnosis not present

## 2014-01-14 DIAGNOSIS — I2510 Atherosclerotic heart disease of native coronary artery without angina pectoris: Secondary | ICD-10-CM | POA: Diagnosis not present

## 2014-01-14 DIAGNOSIS — M810 Age-related osteoporosis without current pathological fracture: Secondary | ICD-10-CM | POA: Diagnosis not present

## 2014-01-14 DIAGNOSIS — F329 Major depressive disorder, single episode, unspecified: Secondary | ICD-10-CM | POA: Diagnosis not present

## 2014-01-14 DIAGNOSIS — E11359 Type 2 diabetes mellitus with proliferative diabetic retinopathy without macular edema: Secondary | ICD-10-CM | POA: Diagnosis not present

## 2014-01-14 DIAGNOSIS — E1142 Type 2 diabetes mellitus with diabetic polyneuropathy: Secondary | ICD-10-CM | POA: Diagnosis not present

## 2014-01-14 DIAGNOSIS — R413 Other amnesia: Secondary | ICD-10-CM | POA: Diagnosis not present

## 2014-01-14 DIAGNOSIS — E785 Hyperlipidemia, unspecified: Secondary | ICD-10-CM | POA: Diagnosis not present

## 2014-01-21 DIAGNOSIS — F3189 Other bipolar disorder: Secondary | ICD-10-CM | POA: Diagnosis not present

## 2014-02-07 DIAGNOSIS — F3189 Other bipolar disorder: Secondary | ICD-10-CM | POA: Diagnosis not present

## 2014-03-04 DIAGNOSIS — F3189 Other bipolar disorder: Secondary | ICD-10-CM | POA: Diagnosis not present

## 2014-03-26 DIAGNOSIS — F3181 Bipolar II disorder: Secondary | ICD-10-CM | POA: Diagnosis not present

## 2014-04-15 DIAGNOSIS — F3181 Bipolar II disorder: Secondary | ICD-10-CM | POA: Diagnosis not present

## 2014-04-18 DIAGNOSIS — E13359 Other specified diabetes mellitus with proliferative diabetic retinopathy without macular edema: Secondary | ICD-10-CM | POA: Diagnosis not present

## 2014-04-18 DIAGNOSIS — H4011X0 Primary open-angle glaucoma, stage unspecified: Secondary | ICD-10-CM | POA: Diagnosis not present

## 2014-04-18 DIAGNOSIS — Z961 Presence of intraocular lens: Secondary | ICD-10-CM | POA: Diagnosis not present

## 2014-04-18 DIAGNOSIS — E10359 Type 1 diabetes mellitus with proliferative diabetic retinopathy without macular edema: Secondary | ICD-10-CM | POA: Diagnosis not present

## 2014-05-07 DIAGNOSIS — F3181 Bipolar II disorder: Secondary | ICD-10-CM | POA: Diagnosis not present

## 2014-05-09 DIAGNOSIS — F3181 Bipolar II disorder: Secondary | ICD-10-CM | POA: Diagnosis not present

## 2014-05-17 DIAGNOSIS — Z23 Encounter for immunization: Secondary | ICD-10-CM | POA: Diagnosis not present

## 2014-05-21 DIAGNOSIS — E785 Hyperlipidemia, unspecified: Secondary | ICD-10-CM | POA: Diagnosis not present

## 2014-05-21 DIAGNOSIS — M859 Disorder of bone density and structure, unspecified: Secondary | ICD-10-CM | POA: Diagnosis not present

## 2014-05-21 DIAGNOSIS — E1142 Type 2 diabetes mellitus with diabetic polyneuropathy: Secondary | ICD-10-CM | POA: Diagnosis not present

## 2014-05-21 DIAGNOSIS — M109 Gout, unspecified: Secondary | ICD-10-CM | POA: Diagnosis not present

## 2014-05-21 DIAGNOSIS — I2510 Atherosclerotic heart disease of native coronary artery without angina pectoris: Secondary | ICD-10-CM | POA: Diagnosis not present

## 2014-05-21 DIAGNOSIS — F329 Major depressive disorder, single episode, unspecified: Secondary | ICD-10-CM | POA: Diagnosis not present

## 2014-05-21 DIAGNOSIS — E10319 Type 1 diabetes mellitus with unspecified diabetic retinopathy without macular edema: Secondary | ICD-10-CM | POA: Diagnosis not present

## 2014-05-21 DIAGNOSIS — E11359 Type 2 diabetes mellitus with proliferative diabetic retinopathy without macular edema: Secondary | ICD-10-CM | POA: Diagnosis not present

## 2014-06-10 DIAGNOSIS — F3181 Bipolar II disorder: Secondary | ICD-10-CM | POA: Diagnosis not present

## 2014-06-19 DIAGNOSIS — M2662 Arthralgia of temporomandibular joint: Secondary | ICD-10-CM | POA: Diagnosis not present

## 2014-06-19 DIAGNOSIS — Z6821 Body mass index (BMI) 21.0-21.9, adult: Secondary | ICD-10-CM | POA: Diagnosis not present

## 2014-06-19 DIAGNOSIS — R739 Hyperglycemia, unspecified: Secondary | ICD-10-CM | POA: Diagnosis not present

## 2014-06-19 DIAGNOSIS — H6090 Unspecified otitis externa, unspecified ear: Secondary | ICD-10-CM | POA: Diagnosis not present

## 2014-07-08 DIAGNOSIS — M25512 Pain in left shoulder: Secondary | ICD-10-CM | POA: Diagnosis not present

## 2014-07-09 DIAGNOSIS — H4011X1 Primary open-angle glaucoma, mild stage: Secondary | ICD-10-CM | POA: Diagnosis not present

## 2014-07-09 DIAGNOSIS — H04123 Dry eye syndrome of bilateral lacrimal glands: Secondary | ICD-10-CM | POA: Diagnosis not present

## 2014-07-09 DIAGNOSIS — Z961 Presence of intraocular lens: Secondary | ICD-10-CM | POA: Diagnosis not present

## 2014-09-06 DIAGNOSIS — E1142 Type 2 diabetes mellitus with diabetic polyneuropathy: Secondary | ICD-10-CM | POA: Diagnosis not present

## 2014-09-06 DIAGNOSIS — E785 Hyperlipidemia, unspecified: Secondary | ICD-10-CM | POA: Diagnosis not present

## 2014-09-06 DIAGNOSIS — M810 Age-related osteoporosis without current pathological fracture: Secondary | ICD-10-CM | POA: Diagnosis not present

## 2014-09-06 DIAGNOSIS — F329 Major depressive disorder, single episode, unspecified: Secondary | ICD-10-CM | POA: Diagnosis not present

## 2014-09-06 DIAGNOSIS — E10319 Type 1 diabetes mellitus with unspecified diabetic retinopathy without macular edema: Secondary | ICD-10-CM | POA: Diagnosis not present

## 2014-09-06 DIAGNOSIS — E11359 Type 2 diabetes mellitus with proliferative diabetic retinopathy without macular edema: Secondary | ICD-10-CM | POA: Diagnosis not present

## 2014-09-06 DIAGNOSIS — E119 Type 2 diabetes mellitus without complications: Secondary | ICD-10-CM | POA: Diagnosis not present

## 2014-09-06 DIAGNOSIS — I2510 Atherosclerotic heart disease of native coronary artery without angina pectoris: Secondary | ICD-10-CM | POA: Diagnosis not present

## 2014-09-06 DIAGNOSIS — R413 Other amnesia: Secondary | ICD-10-CM | POA: Diagnosis not present

## 2014-09-06 DIAGNOSIS — Z6821 Body mass index (BMI) 21.0-21.9, adult: Secondary | ICD-10-CM | POA: Diagnosis not present

## 2014-09-10 DIAGNOSIS — F3181 Bipolar II disorder: Secondary | ICD-10-CM | POA: Diagnosis not present

## 2014-09-10 DIAGNOSIS — Z6821 Body mass index (BMI) 21.0-21.9, adult: Secondary | ICD-10-CM | POA: Diagnosis not present

## 2014-09-10 DIAGNOSIS — E10319 Type 1 diabetes mellitus with unspecified diabetic retinopathy without macular edema: Secondary | ICD-10-CM | POA: Diagnosis not present

## 2014-09-10 DIAGNOSIS — Z4681 Encounter for fitting and adjustment of insulin pump: Secondary | ICD-10-CM | POA: Diagnosis not present

## 2014-09-23 DIAGNOSIS — E11359 Type 2 diabetes mellitus with proliferative diabetic retinopathy without macular edema: Secondary | ICD-10-CM | POA: Diagnosis not present

## 2014-10-22 DIAGNOSIS — F3181 Bipolar II disorder: Secondary | ICD-10-CM | POA: Diagnosis not present

## 2014-11-07 DIAGNOSIS — Z6820 Body mass index (BMI) 20.0-20.9, adult: Secondary | ICD-10-CM | POA: Diagnosis not present

## 2014-11-07 DIAGNOSIS — E10319 Type 1 diabetes mellitus with unspecified diabetic retinopathy without macular edema: Secondary | ICD-10-CM | POA: Diagnosis not present

## 2014-11-07 DIAGNOSIS — Z4681 Encounter for fitting and adjustment of insulin pump: Secondary | ICD-10-CM | POA: Diagnosis not present

## 2014-11-07 DIAGNOSIS — I2510 Atherosclerotic heart disease of native coronary artery without angina pectoris: Secondary | ICD-10-CM | POA: Diagnosis not present

## 2014-11-29 DIAGNOSIS — Z1231 Encounter for screening mammogram for malignant neoplasm of breast: Secondary | ICD-10-CM | POA: Diagnosis not present

## 2014-12-02 DIAGNOSIS — L308 Other specified dermatitis: Secondary | ICD-10-CM | POA: Diagnosis not present

## 2014-12-02 DIAGNOSIS — F3181 Bipolar II disorder: Secondary | ICD-10-CM | POA: Diagnosis not present

## 2014-12-03 DIAGNOSIS — F3181 Bipolar II disorder: Secondary | ICD-10-CM | POA: Diagnosis not present

## 2014-12-10 DIAGNOSIS — I2510 Atherosclerotic heart disease of native coronary artery without angina pectoris: Secondary | ICD-10-CM | POA: Diagnosis not present

## 2014-12-10 DIAGNOSIS — Z6821 Body mass index (BMI) 21.0-21.9, adult: Secondary | ICD-10-CM | POA: Diagnosis not present

## 2014-12-10 DIAGNOSIS — M810 Age-related osteoporosis without current pathological fracture: Secondary | ICD-10-CM | POA: Diagnosis not present

## 2014-12-10 DIAGNOSIS — E10319 Type 1 diabetes mellitus with unspecified diabetic retinopathy without macular edema: Secondary | ICD-10-CM | POA: Diagnosis not present

## 2014-12-10 DIAGNOSIS — M75101 Unspecified rotator cuff tear or rupture of right shoulder, not specified as traumatic: Secondary | ICD-10-CM | POA: Diagnosis not present

## 2014-12-10 DIAGNOSIS — Z1389 Encounter for screening for other disorder: Secondary | ICD-10-CM | POA: Diagnosis not present

## 2014-12-10 DIAGNOSIS — M14679 Charcot's joint, unspecified ankle and foot: Secondary | ICD-10-CM | POA: Diagnosis not present

## 2014-12-10 DIAGNOSIS — R413 Other amnesia: Secondary | ICD-10-CM | POA: Diagnosis not present

## 2014-12-10 DIAGNOSIS — E11359 Type 2 diabetes mellitus with proliferative diabetic retinopathy without macular edema: Secondary | ICD-10-CM | POA: Diagnosis not present

## 2014-12-10 DIAGNOSIS — E785 Hyperlipidemia, unspecified: Secondary | ICD-10-CM | POA: Diagnosis not present

## 2014-12-10 DIAGNOSIS — R2689 Other abnormalities of gait and mobility: Secondary | ICD-10-CM | POA: Diagnosis not present

## 2014-12-10 DIAGNOSIS — E1142 Type 2 diabetes mellitus with diabetic polyneuropathy: Secondary | ICD-10-CM | POA: Diagnosis not present

## 2014-12-12 ENCOUNTER — Inpatient Hospital Stay (HOSPITAL_COMMUNITY): Payer: Medicare Other

## 2014-12-12 ENCOUNTER — Inpatient Hospital Stay (HOSPITAL_COMMUNITY)
Admission: EM | Admit: 2014-12-12 | Discharge: 2014-12-16 | DRG: 919 | Disposition: A | Discharge status: Skilled Nursing Facility | Payer: Medicare Other | Attending: Internal Medicine | Admitting: Internal Medicine

## 2014-12-12 ENCOUNTER — Encounter (HOSPITAL_COMMUNITY): Payer: Self-pay | Admitting: Emergency Medicine

## 2014-12-12 DIAGNOSIS — T85694A Other mechanical complication of insulin pump, initial encounter: Secondary | ICD-10-CM | POA: Diagnosis not present

## 2014-12-12 DIAGNOSIS — E10319 Type 1 diabetes mellitus with unspecified diabetic retinopathy without macular edema: Secondary | ICD-10-CM | POA: Diagnosis present

## 2014-12-12 DIAGNOSIS — I214 Non-ST elevation (NSTEMI) myocardial infarction: Secondary | ICD-10-CM | POA: Diagnosis present

## 2014-12-12 DIAGNOSIS — Z888 Allergy status to other drugs, medicaments and biological substances status: Secondary | ICD-10-CM

## 2014-12-12 DIAGNOSIS — E1051 Type 1 diabetes mellitus with diabetic peripheral angiopathy without gangrene: Secondary | ICD-10-CM | POA: Diagnosis present

## 2014-12-12 DIAGNOSIS — R011 Cardiac murmur, unspecified: Secondary | ICD-10-CM | POA: Diagnosis not present

## 2014-12-12 DIAGNOSIS — R001 Bradycardia, unspecified: Secondary | ICD-10-CM | POA: Diagnosis not present

## 2014-12-12 DIAGNOSIS — D649 Anemia, unspecified: Secondary | ICD-10-CM | POA: Diagnosis present

## 2014-12-12 DIAGNOSIS — E119 Type 2 diabetes mellitus without complications: Secondary | ICD-10-CM | POA: Diagnosis not present

## 2014-12-12 DIAGNOSIS — E10649 Type 1 diabetes mellitus with hypoglycemia without coma: Secondary | ICD-10-CM | POA: Diagnosis not present

## 2014-12-12 DIAGNOSIS — E785 Hyperlipidemia, unspecified: Secondary | ICD-10-CM | POA: Diagnosis present

## 2014-12-12 DIAGNOSIS — F339 Major depressive disorder, recurrent, unspecified: Secondary | ICD-10-CM | POA: Diagnosis not present

## 2014-12-12 DIAGNOSIS — Z955 Presence of coronary angioplasty implant and graft: Secondary | ICD-10-CM

## 2014-12-12 DIAGNOSIS — M1990 Unspecified osteoarthritis, unspecified site: Secondary | ICD-10-CM | POA: Insufficient documentation

## 2014-12-12 DIAGNOSIS — I4510 Unspecified right bundle-branch block: Secondary | ICD-10-CM | POA: Diagnosis present

## 2014-12-12 DIAGNOSIS — Z7982 Long term (current) use of aspirin: Secondary | ICD-10-CM

## 2014-12-12 DIAGNOSIS — Z885 Allergy status to narcotic agent status: Secondary | ICD-10-CM

## 2014-12-12 DIAGNOSIS — J9811 Atelectasis: Secondary | ICD-10-CM | POA: Diagnosis not present

## 2014-12-12 DIAGNOSIS — E0810 Diabetes mellitus due to underlying condition with ketoacidosis without coma: Secondary | ICD-10-CM | POA: Diagnosis not present

## 2014-12-12 DIAGNOSIS — Z8673 Personal history of transient ischemic attack (TIA), and cerebral infarction without residual deficits: Secondary | ICD-10-CM

## 2014-12-12 DIAGNOSIS — E860 Dehydration: Secondary | ICD-10-CM | POA: Diagnosis not present

## 2014-12-12 DIAGNOSIS — Z823 Family history of stroke: Secondary | ICD-10-CM | POA: Diagnosis not present

## 2014-12-12 DIAGNOSIS — M129 Arthropathy, unspecified: Secondary | ICD-10-CM | POA: Diagnosis not present

## 2014-12-12 DIAGNOSIS — K219 Gastro-esophageal reflux disease without esophagitis: Secondary | ICD-10-CM | POA: Diagnosis present

## 2014-12-12 DIAGNOSIS — H547 Unspecified visual loss: Secondary | ICD-10-CM | POA: Diagnosis present

## 2014-12-12 DIAGNOSIS — I272 Other secondary pulmonary hypertension: Secondary | ICD-10-CM | POA: Diagnosis present

## 2014-12-12 DIAGNOSIS — I2510 Atherosclerotic heart disease of native coronary artery without angina pectoris: Secondary | ICD-10-CM | POA: Diagnosis present

## 2014-12-12 DIAGNOSIS — E1110 Type 2 diabetes mellitus with ketoacidosis without coma: Secondary | ICD-10-CM | POA: Diagnosis present

## 2014-12-12 DIAGNOSIS — F329 Major depressive disorder, single episode, unspecified: Secondary | ICD-10-CM | POA: Diagnosis present

## 2014-12-12 DIAGNOSIS — R079 Chest pain, unspecified: Secondary | ICD-10-CM | POA: Diagnosis not present

## 2014-12-12 DIAGNOSIS — Z794 Long term (current) use of insulin: Secondary | ICD-10-CM

## 2014-12-12 DIAGNOSIS — H3560 Retinal hemorrhage, unspecified eye: Secondary | ICD-10-CM | POA: Diagnosis present

## 2014-12-12 DIAGNOSIS — I25119 Atherosclerotic heart disease of native coronary artery with unspecified angina pectoris: Secondary | ICD-10-CM | POA: Diagnosis not present

## 2014-12-12 DIAGNOSIS — N179 Acute kidney failure, unspecified: Secondary | ICD-10-CM | POA: Diagnosis not present

## 2014-12-12 DIAGNOSIS — E08351 Diabetes mellitus due to underlying condition with proliferative diabetic retinopathy with macular edema: Secondary | ICD-10-CM | POA: Diagnosis not present

## 2014-12-12 DIAGNOSIS — E0865 Diabetes mellitus due to underlying condition with hyperglycemia: Secondary | ICD-10-CM | POA: Diagnosis not present

## 2014-12-12 DIAGNOSIS — Z66 Do not resuscitate: Secondary | ICD-10-CM | POA: Diagnosis present

## 2014-12-12 DIAGNOSIS — E1310 Other specified diabetes mellitus with ketoacidosis without coma: Secondary | ICD-10-CM | POA: Diagnosis not present

## 2014-12-12 DIAGNOSIS — T383X6A Underdosing of insulin and oral hypoglycemic [antidiabetic] drugs, initial encounter: Secondary | ICD-10-CM | POA: Diagnosis present

## 2014-12-12 DIAGNOSIS — E1065 Type 1 diabetes mellitus with hyperglycemia: Secondary | ICD-10-CM | POA: Diagnosis present

## 2014-12-12 DIAGNOSIS — E1010 Type 1 diabetes mellitus with ketoacidosis without coma: Secondary | ICD-10-CM | POA: Diagnosis present

## 2014-12-12 DIAGNOSIS — I270 Primary pulmonary hypertension: Secondary | ICD-10-CM | POA: Diagnosis not present

## 2014-12-12 DIAGNOSIS — R7989 Other specified abnormal findings of blood chemistry: Secondary | ICD-10-CM | POA: Diagnosis not present

## 2014-12-12 HISTORY — DX: Unspecified background retinopathy: H35.00

## 2014-12-12 HISTORY — DX: Gastro-esophageal reflux disease without esophagitis: K21.9

## 2014-12-12 LAB — I-STAT VENOUS BLOOD GAS, ED
Acid-base deficit: 9 mmol/L — ABNORMAL HIGH (ref 0.0–2.0)
Bicarbonate: 18.1 mEq/L — ABNORMAL LOW (ref 20.0–24.0)
O2 Saturation: 67 %
PCO2 VEN: 41 mmHg — AB (ref 45.0–50.0)
PO2 VEN: 40 mmHg (ref 30.0–45.0)
TCO2: 19 mmol/L (ref 0–100)
pH, Ven: 7.253 (ref 7.250–7.300)

## 2014-12-12 LAB — BASIC METABOLIC PANEL
ANION GAP: 18 — AB (ref 5–15)
ANION GAP: 7 (ref 5–15)
Anion gap: 8 (ref 5–15)
BUN: 27 mg/dL — AB (ref 6–20)
BUN: 21 mg/dL — ABNORMAL HIGH (ref 6–20)
BUN: 21 mg/dL — ABNORMAL HIGH (ref 6–20)
CALCIUM: 9.6 mg/dL (ref 8.9–10.3)
CALCIUM: 9.2 mg/dL (ref 8.9–10.3)
CHLORIDE: 103 mmol/L (ref 101–111)
CHLORIDE: 107 mmol/L (ref 101–111)
CO2: 14 mmol/L — AB (ref 22–32)
CO2: 21 mmol/L — ABNORMAL LOW (ref 22–32)
CO2: 22 mmol/L (ref 22–32)
CREATININE: 1.16 mg/dL — AB (ref 0.44–1.00)
Calcium: 9.4 mg/dL (ref 8.9–10.3)
Chloride: 109 mmol/L (ref 101–111)
Creatinine, Ser: 1.72 mg/dL — ABNORMAL HIGH (ref 0.44–1.00)
Creatinine, Ser: 1.23 mg/dL — ABNORMAL HIGH (ref 0.44–1.00)
GFR calc Af Amer: 33 mL/min — ABNORMAL LOW (ref >60)
GFR calc Af Amer: 53 mL/min — ABNORMAL LOW (ref >60)
GFR calc non Af Amer: 28 mL/min — ABNORMAL LOW (ref >60)
GFR calc non Af Amer: 42 mL/min — ABNORMAL LOW (ref >60)
GFR calc non Af Amer: 46 mL/min — ABNORMAL LOW (ref >60)
GFR, EST AFRICAN AMERICAN: 49 mL/min — AB (ref >60)
GLUCOSE: 560 mg/dL — AB (ref 65–99)
Glucose, Bld: 246 mg/dL — ABNORMAL HIGH (ref 65–99)
Glucose, Bld: 203 mg/dL — ABNORMAL HIGH (ref 65–99)
POTASSIUM: 3.2 mmol/L — AB (ref 3.5–5.1)
Potassium: 5 mmol/L (ref 3.5–5.1)
Potassium: 3.5 mmol/L (ref 3.5–5.1)
Sodium: 135 mmol/L (ref 135–145)
Sodium: 138 mmol/L (ref 135–145)
Sodium: 136 mmol/L (ref 135–145)

## 2014-12-12 LAB — CBG MONITORING, ED
Glucose-Capillary: 536 mg/dL — ABNORMAL HIGH (ref 65–99)
Glucose-Capillary: 538 mg/dL — ABNORMAL HIGH (ref 65–99)
Glucose-Capillary: 459 mg/dL — ABNORMAL HIGH (ref 65–99)
Glucose-Capillary: 389 mg/dL — ABNORMAL HIGH (ref 65–99)
Glucose-Capillary: 334 mg/dL — ABNORMAL HIGH (ref 65–99)
Glucose-Capillary: 249 mg/dL — ABNORMAL HIGH (ref 65–99)

## 2014-12-12 LAB — FOLATE: FOLATE: 32.5 ng/mL (ref >5.9)

## 2014-12-12 LAB — PHOSPHORUS: PHOSPHORUS: 4 mg/dL (ref 2.5–4.6)

## 2014-12-12 LAB — RETICULOCYTES
RBC.: 3.58 MIL/uL — ABNORMAL LOW (ref 3.87–5.11)
Retic Count, Absolute: 35.8 10*3/uL (ref 19.0–186.0)
Retic Ct Pct: 1 % (ref 0.4–3.1)

## 2014-12-12 LAB — URINALYSIS, ROUTINE W REFLEX MICROSCOPIC
BILIRUBIN URINE: NEGATIVE
Glucose, UA: >1000 mg/dL — AB
HGB URINE DIPSTICK: NEGATIVE
KETONES UR: 40 mg/dL — AB
Leukocytes, UA: NEGATIVE
NITRITE: NEGATIVE
Protein, ur: NEGATIVE mg/dL
Specific Gravity, Urine: 1.021 (ref 1.005–1.030)
UROBILINOGEN UA: 0.2 mg/dL (ref 0.0–1.0)
pH: 5 (ref 5.0–8.0)

## 2014-12-12 LAB — COMPREHENSIVE METABOLIC PANEL
ALK PHOS: 60 U/L (ref 38–126)
ALT: 25 U/L (ref 14–54)
AST: 27 U/L (ref 15–41)
Albumin: 4.2 g/dL (ref 3.5–5.0)
Anion gap: 16 — ABNORMAL HIGH (ref 5–15)
BUN: 21 mg/dL — ABNORMAL HIGH (ref 6–20)
CO2: 18 mmol/L — AB (ref 22–32)
Calcium: 10 mg/dL (ref 8.9–10.3)
Chloride: 101 mmol/L (ref 101–111)
Creatinine, Ser: 1.6 mg/dL — ABNORMAL HIGH (ref 0.44–1.00)
GFR, EST AFRICAN AMERICAN: 36 mL/min — AB (ref >60)
GFR, EST NON AFRICAN AMERICAN: 31 mL/min — AB (ref >60)
GLUCOSE: 547 mg/dL — AB (ref 65–99)
POTASSIUM: 4.6 mmol/L (ref 3.5–5.1)
SODIUM: 135 mmol/L (ref 135–145)
TOTAL PROTEIN: 6.9 g/dL (ref 6.5–8.1)
Total Bilirubin: 1.1 mg/dL (ref 0.3–1.2)

## 2014-12-12 LAB — IRON AND TIBC
Iron: 144 ug/dL (ref 28–170)
Saturation Ratios: 49 % — ABNORMAL HIGH (ref 10.4–31.8)
TIBC: 291 ug/dL (ref 250–450)
UIBC: 147 ug/dL

## 2014-12-12 LAB — GLUCOSE, CAPILLARY
GLUCOSE-CAPILLARY: 222 mg/dL — AB (ref 65–99)
GLUCOSE-CAPILLARY: 212 mg/dL — AB (ref 65–99)
Glucose-Capillary: 172 mg/dL — ABNORMAL HIGH (ref 65–99)
Glucose-Capillary: 164 mg/dL — ABNORMAL HIGH (ref 65–99)
Glucose-Capillary: 137 mg/dL — ABNORMAL HIGH (ref 65–99)

## 2014-12-12 LAB — CBC WITH DIFFERENTIAL/PLATELET
Basophils Absolute: 0 10*3/uL (ref 0.0–0.1)
Basophils Relative: 0 % (ref 0–1)
Eosinophils Absolute: 0 10*3/uL (ref 0.0–0.7)
Eosinophils Relative: 0 % (ref 0–5)
HEMATOCRIT: 32.1 % — AB (ref 36.0–46.0)
HEMOGLOBIN: 10.4 g/dL — AB (ref 12.0–15.0)
LYMPHS ABS: 1.1 10*3/uL (ref 0.7–4.0)
Lymphocytes Relative: 14 % (ref 12–46)
MCH: 27.7 pg (ref 26.0–34.0)
MCHC: 32.4 g/dL (ref 30.0–36.0)
MCV: 85.4 fL (ref 78.0–100.0)
MONO ABS: 0.2 10*3/uL (ref 0.1–1.0)
MONOS PCT: 3 % (ref 3–12)
NEUTROS PCT: 83 % — AB (ref 43–77)
Neutro Abs: 6.2 10*3/uL (ref 1.7–7.7)
Platelets: 196 10*3/uL (ref 150–400)
RBC: 3.76 MIL/uL — AB (ref 3.87–5.11)
RDW: 14.8 % (ref 11.5–15.5)
WBC: 7.5 10*3/uL (ref 4.0–10.5)

## 2014-12-12 LAB — URINE MICROSCOPIC-ADD ON

## 2014-12-12 LAB — LIPASE, BLOOD: Lipase: 21 U/L — ABNORMAL LOW (ref 22–51)

## 2014-12-12 LAB — I-STAT CG4 LACTIC ACID, ED: Lactic Acid, Venous: 1.94 mmol/L (ref 0.5–2.0)

## 2014-12-12 LAB — FERRITIN: FERRITIN: 28 ng/mL (ref 11–307)

## 2014-12-12 LAB — TROPONIN I
TROPONIN I: 0.34 ng/mL — AB (ref <0.031)
Troponin I: <0.03 ng/mL (ref <0.031)

## 2014-12-12 LAB — TSH: TSH: 1.192 u[IU]/mL (ref 0.350–4.500)

## 2014-12-12 LAB — VITAMIN B12: VITAMIN B 12: 661 pg/mL (ref 180–914)

## 2014-12-12 LAB — MAGNESIUM: MAGNESIUM: 1.9 mg/dL (ref 1.7–2.4)

## 2014-12-12 LAB — MRSA PCR SCREENING: MRSA BY PCR: NEGATIVE

## 2014-12-12 MED ORDER — ONDANSETRON HCL 4 MG/2ML IJ SOLN
4.0000 mg | Freq: Once | INTRAMUSCULAR | Status: AC
  Administered 2014-12-12: 4 mg via INTRAVENOUS
  Filled 2014-12-12: qty 2

## 2014-12-12 MED ORDER — SODIUM CHLORIDE 0.9 % IV SOLN
Freq: Once | INTRAVENOUS | Status: AC
  Administered 2014-12-12 (×2): via INTRAVENOUS

## 2014-12-12 MED ORDER — SODIUM CHLORIDE 0.9 % IV SOLN
INTRAVENOUS | Status: DC
  Administered 2014-12-12: 6.6 [IU]/h via INTRAVENOUS

## 2014-12-12 MED ORDER — ARIPIPRAZOLE 2 MG PO TABS
2.0000 mg | ORAL_TABLET | Freq: Every day | ORAL | Status: DC
  Filled 2014-12-12: qty 1

## 2014-12-12 MED ORDER — SODIUM CHLORIDE 0.9 % IV BOLUS (SEPSIS)
500.0000 mL | Freq: Once | INTRAVENOUS | Status: AC
  Administered 2014-12-12: 500 mL via INTRAVENOUS

## 2014-12-12 MED ORDER — POTASSIUM CHLORIDE 10 MEQ/100ML IV SOLN
10.0000 meq | INTRAVENOUS | Status: AC
  Filled 2014-12-12: qty 100

## 2014-12-12 MED ORDER — ADULT MULTIVITAMIN W/MINERALS CH
1.0000 | ORAL_TABLET | Freq: Every day | ORAL | Status: DC
  Administered 2014-12-13 – 2014-12-16 (×4): 1 via ORAL
  Filled 2014-12-12 (×4): qty 1

## 2014-12-12 MED ORDER — DEXTROSE-NACL 5-0.45 % IV SOLN
INTRAVENOUS | Status: DC
  Administered 2014-12-12: 20:00:00 via INTRAVENOUS

## 2014-12-12 MED ORDER — PANTOPRAZOLE SODIUM 40 MG PO TBEC
40.0000 mg | DELAYED_RELEASE_TABLET | Freq: Every day | ORAL | Status: DC
  Administered 2014-12-13 – 2014-12-16 (×4): 40 mg via ORAL
  Filled 2014-12-12 (×4): qty 1

## 2014-12-12 MED ORDER — SODIUM CHLORIDE 0.9 % IV SOLN
INTRAVENOUS | Status: AC
  Administered 2014-12-12: 16:00:00 via INTRAVENOUS

## 2014-12-12 MED ORDER — ASPIRIN EC 325 MG PO TBEC
325.0000 mg | DELAYED_RELEASE_TABLET | Freq: Every day | ORAL | Status: DC
  Administered 2014-12-13 – 2014-12-16 (×4): 325 mg via ORAL
  Filled 2014-12-12 (×4): qty 1

## 2014-12-12 MED ORDER — SODIUM CHLORIDE 0.9 % IV SOLN
INTRAVENOUS | Status: DC
  Administered 2014-12-12: 4.8 [IU]/h via INTRAVENOUS
  Filled 2014-12-12: qty 2.5

## 2014-12-12 MED ORDER — ONDANSETRON HCL 4 MG/2ML IJ SOLN
4.0000 mg | Freq: Three times a day (TID) | INTRAMUSCULAR | Status: AC | PRN
  Administered 2014-12-12: 4 mg via INTRAVENOUS
  Filled 2014-12-12: qty 2

## 2014-12-12 MED ORDER — SODIUM CHLORIDE 0.9 % IV BOLUS (SEPSIS): 1000.0000 mL | Freq: Once | INTRAVENOUS | Status: DC

## 2014-12-12 MED ORDER — DEXTROSE-NACL 5-0.45 % IV SOLN: INTRAVENOUS | Status: DC

## 2014-12-12 MED ORDER — SODIUM CHLORIDE 0.9 % IV SOLN: INTRAVENOUS | Status: AC

## 2014-12-12 MED ORDER — ROSUVASTATIN CALCIUM 5 MG PO TABS
5.0000 mg | ORAL_TABLET | Freq: Every day | ORAL | Status: DC
  Filled 2014-12-12 (×2): qty 1

## 2014-12-12 MED ORDER — ENOXAPARIN SODIUM 40 MG/0.4ML ~~LOC~~ SOLN
40.0000 mg | SUBCUTANEOUS | Status: DC
  Administered 2014-12-14 – 2014-12-16 (×3): 40 mg via SUBCUTANEOUS
  Filled 2014-12-12 (×5): qty 0.4

## 2014-12-12 MED ORDER — SODIUM CHLORIDE 0.9 % IV SOLN: 1000.0000 mL | INTRAVENOUS | Status: DC

## 2014-12-12 MED ORDER — SERTRALINE HCL 50 MG PO TABS
150.0000 mg | ORAL_TABLET | Freq: Every day | ORAL | Status: DC
  Administered 2014-12-13 – 2014-12-16 (×4): 150 mg via ORAL
  Filled 2014-12-12 (×7): qty 1

## 2014-12-12 MED ORDER — SODIUM CHLORIDE 0.9 % IV SOLN
INTRAVENOUS | Status: DC
  Administered 2014-12-12: 14:00:00 via INTRAVENOUS

## 2014-12-12 NOTE — ED Provider Notes (Addendum)
CSN: [id number]     Arrival date & time 12/12/14  4098 History   First MD Initiated Contact with Patient 12/12/14 [id number]     Chief Complaint  Patient presents with  . Hyperglycemia  . Emesis    HPI  Patient presents with concern of nausea, vomiting, fatigue. Symptoms have developed over the past 24 hours, and in particular over the past 3 or 4 hours. Patient was well prior to the onset of symptoms. Patient has a history of dependent diabetes, uses an insulin pump. Yesterday, as her blood glucose levels increased, she attempted to take increased doses of insulin, without notable change in the numeric values. Currently the patient complains of nausea, no abdominal pain, chest pain, no confusion, disorientation. No fever, chills.   Past Medical History  Diagnosis Date  . Diabetes mellitus   . Arthritis   . Coronary artery disease   . Cataract   . Acid reflux   . Depression   . Stroke   . GERD (gastroesophageal reflux disease)   . Retinopathy   . H/O cardiac catheterization    Past Surgical History  Procedure Laterality Date  . Abdominal hysterectomy    . Cataract surgery     Family History  Problem Relation Age of Onset  . Liver disease Sister   . Colon cancer Brother    History  Substance Use Topics  . Smoking status: Never Smoker   . Smokeless tobacco: Not on file  . Alcohol Use: Yes   OB History    No data available     Review of Systems  Constitutional:       Per HPI, otherwise negative  HENT:       Per HPI, otherwise negative  Respiratory:       Per HPI, otherwise negative  Cardiovascular:       Per HPI, otherwise negative  Gastrointestinal: Positive for nausea and vomiting.  Endocrine:       Negative aside from HPI  Genitourinary:       Neg aside from HPI   Musculoskeletal:       Per HPI, otherwise negative  Skin: Negative.   Neurological: Negative for syncope.      Allergies  Codeine; Diclofenac sodium; Doxycycline calcium; and Zocor  Home  Medications   Prior to Admission medications   Medication Sig Start Date End Date Taking? Authorizing Provider  ARIPiprazole (ABILIFY) 2 MG tablet Take 2 mg by mouth daily.   Yes Historical Provider, MD  aspirin 325 MG EC tablet Take 325 mg by mouth daily.   Yes Historical Provider, MD  cholecalciferol (VITAMIN D) 1000 UNITS tablet Take 2,000 Units by mouth daily.   Yes Historical Provider, MD  docusate sodium (COLACE) 50 MG capsule Take 100 mg by mouth 2 (two) times daily.   Yes Historical Provider, MD  insulin lispro (HUMALOG) 100 UNIT/ML injection Inject 1-50 Units into the skin 3 (three) times daily before meals. On a insulin pump   Yes Historical Provider, MD  Multiple Vitamin (MULITIVITAMIN WITH MINERALS) TABS Take 1 tablet by mouth daily.   Yes Historical Provider, MD  omeprazole (PRILOSEC) 20 MG capsule Take 40 mg by mouth daily.   Yes Historical Provider, MD  rosuvastatin (CRESTOR) 5 MG tablet Take 5 mg by mouth daily.   Yes Historical Provider, MD  sertraline (ZOLOFT) 100 MG tablet Take 150 mg by mouth daily.   Yes Historical Provider, MD   BP 109/40 mmHg  Pulse 82  Temp(Src) 97.5 F (  36.4 C) (Oral)  Resp 18  Ht 5\' 2"  (1.575 m)  Wt 113 lb (51.256 kg)  BMI 20.66 kg/m2  SpO2 100% Physical Exam  Constitutional: She is oriented to person, place, and time. She appears well-developed and well-nourished. No distress.  HENT:  Head: Normocephalic and atraumatic.  Eyes: Conjunctivae and EOM are normal.  Cardiovascular: Normal rate and regular rhythm.   Pulmonary/Chest: Effort normal and breath sounds normal. No stridor. No respiratory distress.  Abdominal: She exhibits no distension. There is no tenderness.  Musculoskeletal: She exhibits no edema.  Neurological: She is alert and oriented to person, place, and time. No cranial nerve deficit.  Skin: Skin is warm and dry.  Psychiatric: She has a normal mood and affect.  Nursing note and vitals reviewed.   ED Course  Procedures  (including critical care time) Labs Review Labs Reviewed  COMPREHENSIVE METABOLIC PANEL - Abnormal; Notable for the following:    CO2 18 (*)    Glucose, Bld 547 (*)    BUN 21 (*)    Creatinine, Ser 1.60 (*)    GFR calc non Af Amer 31 (*)    GFR calc Af Amer 36 (*)    Anion gap 16 (*)    All other components within normal limits  LIPASE, BLOOD - Abnormal; Notable for the following:    Lipase 21 (*)    All other components within normal limits  CBC WITH DIFFERENTIAL/PLATELET - Abnormal; Notable for the following:    RBC 3.76 (*)    Hemoglobin 10.4 (*)    HCT 32.1 (*)    Neutrophils Relative % 83 (*)    All other components within normal limits  URINALYSIS, ROUTINE W REFLEX MICROSCOPIC (NOT AT [facility]) - Abnormal; Notable for the following:    Glucose, UA >1000 (*)    Ketones, ur 40 (*)    All other components within normal limits  URINE MICROSCOPIC-ADD ON - Abnormal; Notable for the following:    Casts HYALINE CASTS (*)    All other components within normal limits  I-STAT VENOUS BLOOD GAS, ED - Abnormal; Notable for the following:    pCO2, Ven 41.0 (*)    Bicarbonate 18.1 (*)    Acid-base deficit 9.0 (*)    All other components within normal limits  CBG MONITORING, ED - Abnormal; Notable for the following:    Glucose-Capillary 536 (*)    All other components within normal limits  I-STAT CG4 LACTIC ACID, ED   Initial labs notable for hyperglycemia.   Patient received fluid resuscitation empirically, and with hyperglycemia, patient will receive insulin, given anion gap of 16, persistent nausea, vomiting. Patient elevated creatinine as well.  Patient is acidotic, pH 7.25  On repeat exam the patient appears calm, fluids, insulin running.  I discussed patient's case with our hospice team for admission.   EKG Interpretation  Date/Time:  Thursday December 12 2014 13:23:41 EDT Ventricular Rate:  59 PR Interval:  176 QRS Duration: 103 QT Interval:  474 QTC Calculation: 470 R  Axis:   83 Text Interpretation:  Sinus rhythm Atrial premature complex Borderline right axis deviation Minimal ST depression, lateral leads Sinus rhythm Artifact ST-t wave abnormality Abnormal ekg Confirmed by Gerhard MunchLOCKWOOD, Jarell Mcewen  MD [phone]([id number]) on 12/12/2014 1:59:25 PM        MDM   Final diagnoses:  Diabetic ketoacidosis without coma associated with type 1 diabetes mellitus   Elderly female with insulin-dependent diabetes, insulin pump presents with nausea, vomiting, fatigue. Patient has evidence for  acidosis, anion gap, hyperglycemia, and patient received insulin drip, continuous monitoring. Patient tolerated initiation of therapy well, had no additional dose episodes of vomiting following this. Patient reported admission to stepdown unit  CRITICAL CARE Performed by: Gerhard Munch Total critical care time: 35 Critical care time was exclusive of separately billable procedures and treating other patients. Critical care was necessary to treat or prevent imminent or life-threatening deterioration. Critical care was time spent personally by me on the following activities: development of treatment plan with patient and/or surrogate as well as nursing, discussions with consultants, evaluation of patient's response to treatment, examination of patient, obtaining history from patient or surrogate, ordering and performing treatments and interventions, ordering and review of laboratory studies, ordering and review of radiographic studies, pulse oximetry and re-evaluation of patient's condition.   Gerhard Munch, MD 12/12/14 1214  Gerhard Munch, MD 12/12/14 1359

## 2014-12-12 NOTE — ED Notes (Signed)
Dr. Elvera LennoxGherghe at bedside, updated that Revonda StandardAllison, NP has been paged due to pt hypotension and also new bradycardia. Pt alert and oriented at this time. New EKG obtained and saline bolus ordered.

## 2014-12-12 NOTE — ED Notes (Signed)
Pt from home for eval of hyperglycemia x 2 days, pt states cbg read 500 today and was told by PCP to come to ED. Pt reports 4 episodes of emesis today, denies any acute pain. 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 Denies any fevers or diarrhea.

## 2014-12-12 NOTE — Progress Notes (Signed)
Additional history obtained; was not initially informed that patient had been experiencing additional nausea and vomiting with dry heaves prior to onset of symptomatic bradycardia and agree with cardiology assessment of vasovagal bradycardia.  Junious SilkAllison Genecis Veley, ANP

## 2014-12-12 NOTE — Progress Notes (Signed)
UR COMPLETED  

## 2014-12-12 NOTE — Progress Notes (Signed)
  Echocardiogram 2D Echocardiogram has been performed.  Cathie BeamsGREGORY, Maragret Vanacker 12/12/2014, 3:31 PM

## 2014-12-12 NOTE — ED Notes (Signed)
Echo at bedsdie  

## 2014-12-12 NOTE — ED Notes (Signed)
Transport to take pt to echo, this RN requested bedside due to pt condition. Echo tech en route.

## 2014-12-12 NOTE — H&P (Signed)
[facility] History and Physical                                                                                    Beth Payne, is a 74 y.o. female  MRN: [id number]   DOB - 03/29/1941  Admit Date - 12/12/2014  Outpatient Primary MD for the patient is Kelli ChurnSteven South,MD  Referring MD: Jeraldine LootsLockwood / ER  Consulting M.D: [facility]  With History of -  Past Medical History  Diagnosis Date  . Diabetes mellitus   . Arthritis   . Coronary artery disease   . Cataract   . Acid reflux   . Depression   . Stroke   . GERD (gastroesophageal reflux disease)   . Retinopathy   . H/O cardiac catheterization       Past Surgical History  Procedure Laterality Date  . Abdominal hysterectomy    . Cataract surgery      in for   Chief Complaint  Patient presents with  . Hyperglycemia  . Emesis     HPI This is a 74 year old female patient with past medical history of insulin-dependent diabetes mellitus on an insulin pump, GERD with reflux, history of CAD status post stent to RCA in 2012, osteoarthritis, history of stroke, and history of depression. She presents to the ER today with progressive hyperglycemia and associated nausea/vomiting and fatigue. Patient reports yesterday morning that she noticed initial CBG was 311; repeated several hours later and it was 314 so she changed out her insulin pump set up. Unfortunately her CBG was up to 500s this morning and she developed symptoms of nausea, fatigue and emesis. Patient reports she has been on an insulin pump for at least 16 years and typically does not have problems with utilization of the pump. She reports that the insulin that she places in the pump is up-to-date and has not expired. She is not having any other symptoms such as chest pain, shortness of breath, dysuria or urinary frequency, no cough fevers or chills.  In the ER patient was afebrile, BP was 101/38 with subsequent dip in blood pressure to 83/33 and decrease in heart  rate from 81 to 56 associated with that hypotensive episode. She was subsequently given a fluid challenge by the ER physician. She was maintaining saturations of 99-100% on room air. Blood glucose was 538 with a serum CO2 of 18 and an anion gap of 16 so patient was placed on an insulin infusion. Her lactic acid was 1.94. She also had acute kidney injury with a BUN of 21 and creatinine of 1.6 with baseline renal function of 12 and 0.8. CBC was normal except for hemoglobin of 10.4 with last known hemoglobin 12.9 in 2013.   Review of Systems   In addition to the HPI above,  No Fever-chills, myalgias or other constitutional symptoms No Headache, changes with Vision or hearing, new weakness, tingling, numbness in any extremity, No problems swallowing food or Liquids, indigestion/reflux No Chest pain, Cough or Shortness of Breath, palpitations, orthopnea or DOE No Abdominal pain, no melena or hematochezia, no dark tarry stools, Bowel movements are regular, No dysuria, hematuria or flank pain No new  skin rashes, lesions, masses or bruises, No new joints pains-aches No recent weight gain or loss No polyuria, polydypsia or polyphagia,  *A full 10 point Review of Systems was done, except as stated above, all other Review of Systems were negative.  Social History History  Substance Use Topics  . Smoking status: Never Smoker   . Smokeless tobacco: Not on file  . Alcohol Use: Yes    Resides at: Private residence  Lives with: Alone  Ambulatory status: Without assistive devices   Family History Family History  Problem Relation Age of Onset  . Liver disease Sister   . Colon cancer Brother      Prior to Admission medications   Medication Sig Start Date End Date Taking? Authorizing Provider  ARIPiprazole (ABILIFY) 2 MG tablet Take 2 mg by mouth daily.   Yes Historical Provider, MD  aspirin 325 MG EC tablet Take 325 mg by mouth daily.   Yes Historical Provider, MD  cholecalciferol (VITAMIN  D) 1000 UNITS tablet Take 2,000 Units by mouth daily.   Yes Historical Provider, MD  docusate sodium (COLACE) 50 MG capsule Take 100 mg by mouth 2 (two) times daily.   Yes Historical Provider, MD  insulin lispro (HUMALOG) 100 UNIT/ML injection Inject 1-50 Units into the skin 3 (three) times daily before meals. On a insulin pump   Yes Historical Provider, MD  Multiple Vitamin (MULITIVITAMIN WITH MINERALS) TABS Take 1 tablet by mouth daily.   Yes Historical Provider, MD  omeprazole (PRILOSEC) 20 MG capsule Take 40 mg by mouth daily.   Yes Historical Provider, MD  rosuvastatin (CRESTOR) 5 MG tablet Take 5 mg by mouth daily.   Yes Historical Provider, MD  sertraline (ZOLOFT) 100 MG tablet Take 150 mg by mouth daily.   Yes Historical Provider, MD    Allergies  Allergen Reactions  . Codeine Nausea And Vomiting and Other (See Comments)    Dizziness   . Diclofenac Sodium Swelling and Other (See Comments)    Blurred vision  . Doxycycline Calcium Swelling and Other (See Comments)    Blurred vision  . Zocor [Simvastatin] Other (See Comments)    Dizziness, weakness     Physical Exam  Vitals  Blood pressure 83/33, pulse 74, temperature 97.5 F (36.4 C), temperature source Oral, resp. rate 22, height 5\' 2"  (1.575 m), weight 113 lb (51.256 kg), SpO2 100 %.   General:  In no acute distress, appears younger than stated age  Psych:  Normal affect, Denies Suicidal or Homicidal ideations, Awake Alert, Oriented X 3. Speech and thought patterns are clear and appropriate, no apparent short term memory deficits  Neuro:   No focal neurological deficits, CN II through XII intact, Strength 5/5 all 4 extremities, Sensation intact all 4 extremities.  ENT:  Ears and Eyes appear Normal, Conjunctivae clear, PER. Moist oral mucosa without erythema or exudates.  Neck:  Supple, No lymphadenopathy appreciated  Respiratory:  Symmetrical chest wall movement, Good air movement bilaterally, CTAB. Room Air  Cardiac:   RRR, systolic murmur left sternal border fourth intercostal space, no LE edema noted, no JVD, No carotid bruits, peripheral pulses palpable at 2+  Abdomen:  Positive bowel sounds, Soft, Non tender, Non distended,  No masses appreciated, no obvious hepatosplenomegaly  Skin:  No Cyanosis, Normal Skin Turgor, No Skin Rash or Bruise.  Extremities: Symmetrical without obvious trauma or injury,  no effusions.  Data Review  CBC  Recent Labs Lab 12/12/14 1010  WBC 7.5  HGB 10.4*  HCT 32.1*  PLT 196  MCV 85.4  MCH 27.7  MCHC 32.4  RDW 14.8  LYMPHSABS 1.1  MONOABS 0.2  EOSABS 0.0  BASOSABS 0.0    Chemistries   Recent Labs Lab 12/12/14 1010  NA 135  K 4.6  CL 101  CO2 18*  GLUCOSE 547*  BUN 21*  CREATININE 1.60*  CALCIUM 10.0  AST 27  ALT 25  ALKPHOS 60  BILITOT 1.1    estimated creatinine clearance is 24.8 mL/min (by C-G formula based on Cr of 1.6).  No results for input(s): TSH, T4TOTAL, T3FREE, THYROIDAB in the last 72 hours.  Invalid input(s): FREET3  Coagulation profile No results for input(s): INR, PROTIME in the last 168 hours.  No results for input(s): DDIMER in the last 72 hours.  Cardiac Enzymes No results for input(s): CKMB, TROPONINI, MYOGLOBIN in the last 168 hours.  Invalid input(s): CK  Invalid input(s): POCBNP  Urinalysis    Component Value Date/Time   COLORURINE YELLOW 12/12/2014 1130   APPEARANCEUR CLEAR 12/12/2014 1130   LABSPEC 1.021 12/12/2014 1130   PHURINE 5.0 12/12/2014 1130   GLUCOSEU >1000* 12/12/2014 1130   HGBUR NEGATIVE 12/12/2014 1130   BILIRUBINUR NEGATIVE 12/12/2014 1130   KETONESUR 40* 12/12/2014 1130   PROTEINUR NEGATIVE 12/12/2014 1130   UROBILINOGEN 0.2 12/12/2014 1130   NITRITE NEGATIVE 12/12/2014 1130   LEUKOCYTESUR NEGATIVE 12/12/2014 1130    Imaging results:   No results found.   EKG: Has been ordered and is pending at time of dictation   Assessment & Plan  Principal Problem:   DKA  -Admit to  stepdown -Continue insulin infusion -Diabetes educator consultation for pump check; would not resume home insulin pump until evaluated by the educator to determine if malfunctioning -BMET every 4 hours until anion gap closed -Noncaloric clear liquids until anion gap closed -Hemoglobin A1c -Suspect pump malfunction as etiology to DKA but need to consider other sources/stressors, including a cardiac event; no leukocytosis or fever and urinalysis is negative so doubt UTI-check portable chest x-ray to rule out atypical pneumonia  Active Problems:   Symptomatic bradycardia -Patient has had recurrent episodes of heart rates in the 40s and 50s without evidence of heart block but with systolic blood pressure dropping into the 70s and 80s -Have ordered repeat EKG while bradycardic -Continue telemetry monitoring -Give additional fluid challenge -Was not on any rate controlling agents at home -Consider holding Abilify noting adverse reactions can include tachycardia/bradycardia -consulted Cardiology, appreciate input    Acute renal failure/Dehydration -Secondary to DKA -IV fluids per protocol -Labs in a.m.    CAD s/p stent to RCA (2012) -Possibility that DKA could be precipitated by occult cardiac ischemia; patient currently denying typical symptoms (however she is long standing diabetic) and has been having worsening nausea today, and in review of cath report patient was having exertional angina prior to stent placement in 2012 -cycle cardiac enzymes    Systolic murmur -Check 2-D echocardiogram    Normocytic anemia -Downward trend in 3 years -Check anemia panel and TSH -No reported melana or blood in stools    GERD -Continue PPI    Dyslipidemia -Continue Crestor    DVT Prophylaxis: Lovenox  Family Communication:   No family at bedside  Code Status:  DO NOT RESUSCITATE  Condition:  Stable  Discharge disposition: Anticipate discharge to home once DKA physiology resolved and can  adequately ensure home insulin pump functioning appropriately       ELLIS,ALLISON L. ANP on 12/12/2014 at  12:35 PM  Between 7am to 7pm - Pager - [phone]  After 7pm go to [email or web address] - password [email or web address]  And look for the night coverage person covering me after hours  [facility]  Patient was seen, examined,treatment plan was discussed with the Advance Practice Provider.  I have directly reviewed the clinical findings, lab, imaging studies and management of this patient in detail. I have made the necessary changes to the above noted documentation, and agree with the documentation, as recorded by the Advance Practice Provider.   Pamella Pert, MD [facility] [phone]

## 2014-12-12 NOTE — Consult Note (Signed)
Patient ID: AZALYA GALYON MRN: [id number], DOB/AGE: Apr 01, 1941   Admit date: 12/12/2014   Primary Physician: [name], MD Primary Cardiologist: None currently. Has seen Dr. Elease Hashimoto and Dr. Mayford Knife in the distant past.   Pt. Profile: Kearra Calkin is a 74 year old female with a PMH of type 1 diabetes mellitus treated with an Acu-Chek Spirit Combo insulin pump, retinal hemorrhage, GERD, CAD s/p stent to RCA in 2001, OA, CVA, and depression who presents with 2 days of malignant hyperglycemia associated with nausea/vomiting and fatigue.   Problem List  Past Medical History  Diagnosis Date  . Diabetes mellitus   . Arthritis   . Coronary artery disease   . Cataract   . Acid reflux   . Depression   . Stroke   . GERD (gastroesophageal reflux disease)   . Retinopathy   . H/O cardiac catheterization     Past Surgical History  Procedure Laterality Date  . Abdominal hysterectomy    . Cataract surgery       Allergies  Allergies  Allergen Reactions  . Codeine Nausea And Vomiting and Other (See Comments)    Dizziness   . Diclofenac Sodium Swelling and Other (See Comments)    Blurred vision  . Doxycycline Calcium Swelling and Other (See Comments)    Blurred vision  . Zocor [Simvastatin] Other (See Comments)    Dizziness, weakness     HPI  Thalya Fouche is a 75 year old female with a PMH of type 1 diabetes mellitus treated with an Acu-Chek Spirit Combo insulin pump (hourly basal rate 0.6 units/hr), GERD, CAD s/p stent to RCA in 2001, OA, CVA, and depression who presents with 2 days of malignant hyperglycemia associated with nausea/vomiting and fatigue. Her last cath were in 2001. Apparently she was diagnosed with CAD after having abnormal stress test as part of of preop clearance for surgery. She received a stent to mid RCA in 07/1999. She had relook cath on 03/2000 which showed stable coronary anatomy with 20-30% mid LCx, 20-30% prox RCA, normal EF. She has not had  followup with cardiology in recent years. She walks around with a walker and denies any anginal symptom recently. She denies any recent LE edema, orthopnea or PND.   She has been followed by Dr. Evlyn Kanner for her DM. The last night she saw Dr. Evlyn Kanner was on Tuesday at which time her BG was stable. While on the insulin pump, her BG usually run in the 100s. However since her visit with Dr. Evlyn Kanner, she has been noticing her BG going up to >300 in the last few days. The patient was in her usual state of health until yesterday morning when her BSG was 311. She changed out her insulin pump infusion site and replaced the insulin reservoir with fresh insulin and administered a bolus correction dose. Unfortunately her BSG continued to climb into the 4-500s throughout yesterday and into this morning. She tells me she did administer 2 separate correction doses of Humalog via sub-q injection, but this did not seem to help. This morning she was nauseous and vomiting, which prompted her to come to the ER.   At 1200, she became bradycardic to the 40s and hypotensive with SBPs in the 70s. Immediately prior to this, she was very nauseous and vomiting. She was dry heaving into an emesis bag intermittently until about 1400. She was given an IVF bolus and the bradycardia/hypotension resolved spontaneously. She did not have any LOC changes. Significant lab finding  include Cr 1.72, trop negative, glucose 560, normal TSH. Lactic acid 1.94. UA shows glucose >1000. Initial EKG showed right bundle branch block with prolonged QTC. Subsequent EKG obtained 10 minutes later showed narrow QRS sinus rhythm with minimal ST depression in V5 and 6. Cardiology was consulted for new-onset sinus bradycardia and hypotension.   Home Medications  Prior to Admission medications   Medication Sig Start Date End Date Taking? Authorizing Provider  ARIPiprazole (ABILIFY) 2 MG tablet Take 2 mg by mouth daily.   Yes Historical Provider, MD  aspirin 325 MG EC  tablet Take 325 mg by mouth daily.   Yes Historical Provider, MD  cholecalciferol (VITAMIN D) 1000 UNITS tablet Take 2,000 Units by mouth daily.   Yes Historical Provider, MD  docusate sodium (COLACE) 50 MG capsule Take 100 mg by mouth 2 (two) times daily.   Yes Historical Provider, MD  insulin lispro (HUMALOG) 100 UNIT/ML injection Inject 1-50 Units into the skin 3 (three) times daily before meals. On a insulin pump   Yes Historical Provider, MD  Multiple Vitamin (MULITIVITAMIN WITH MINERALS) TABS Take 1 tablet by mouth daily.   Yes Historical Provider, MD  omeprazole (PRILOSEC) 20 MG capsule Take 40 mg by mouth daily.   Yes Historical Provider, MD  rosuvastatin (CRESTOR) 5 MG tablet Take 5 mg by mouth daily.   Yes Historical Provider, MD  sertraline (ZOLOFT) 100 MG tablet Take 150 mg by mouth daily.   Yes Historical Provider, MD    Family History  Family History  Problem Relation Age of Onset  . Liver disease Sister   . Colon cancer Brother     Social History  History   Social History  . Marital Status: Widowed    Spouse Name: N/A  . Number of Children: N/A  . Years of Education: N/A   Occupational History  . Not on file.   Social History Main Topics  . Smoking status: Never Smoker   . Smokeless tobacco: Not on file  . Alcohol Use: Yes  . Drug Use: Not on file  . Sexual Activity: Not on file   Other Topics Concern  . Not on file   Social History Narrative     Review of Systems General:  No chills, fever, night sweats or weight changes.  Cardiovascular:  No chest pain, dyspnea on exertion, edema, orthopnea, palpitations, paroxysmal nocturnal dyspnea. Dermatological: No rash, lesions/masses Respiratory: No cough, dyspnea Urologic: No hematuria, dysuria Abdominal:  No diarrhea, bright red blood per rectum, melena, or hematemesis. Nausea and vomiting per HPI.  Neurologic:  No visual changes, wkns, changes in mental status. All other systems reviewed and are otherwise  negative except as noted above.  Physical Exam  Blood pressure 94/34, pulse 65, temperature 97.5 F (36.4 C), temperature source Oral, resp. rate 23, height 5\' 2"  (1.575 m), weight 51.256 kg (113 lb), SpO2 99 %.  General: Pleasant, alert, NAD Psych: Normal affect. Neuro: Alert and oriented X 3. Moves all extremities spontaneously. HEENT: Impaired vision (chronic).   Neck: Supple without bruits  +JVD. Lungs:  Resp regular and unlabored. Bibasilar crackles noted. On nasal canula.  Heart: RRR. S1, S2, and S3 present. Harsh 3/6 systolic murmur auscultated at LUSB.  Abdomen: Soft, non-tender, non-distended, BS + x 4.  Extremities: No clubbing, cyanosis or edema. DP/PT/Radials 2+ and equal bilaterally.  Labs  Troponin [facility](Point of Care Test)  Recent Labs  12/12/14 1320  TROPONINI <0.03   Lab Results  Component Value Date  WBC 7.5 12/12/2014   HGB 10.4* 12/12/2014   HCT 32.1* 12/12/2014   MCV 85.4 12/12/2014   PLT 196 12/12/2014    Recent Labs Lab 12/12/14 1010 12/12/14 1320  NA 135 135  K 4.6 5.0  CL 101 103  CO2 18* 14*  BUN 21* 27*  CREATININE 1.60* 1.72*  CALCIUM 10.0 9.6  PROT 6.9  --   BILITOT 1.1  --   ALKPHOS 60  --   ALT 25  --   AST 27  --   GLUCOSE 547* 560*    Radiology/Studies  Dg Chest Port 1 View  12/12/2014   CLINICAL DATA:  DKA  EXAM: PORTABLE CHEST - 1 VIEW  COMPARISON:  09/08/2011  FINDINGS: Mild cardiac enlargement. Right Coronary artery stent. Mild vascular congestion without edema.  Mild bibasilar atelectasis/ infiltrate.  No effusion.  IMPRESSION: Mild vascular congestion without edema  Mild bibasilar atelectasis/infiltrate.   Electronically Signed   By: Marlan Palau M.D.   On: 12/12/2014 13:30   ECG SB, rate 59, borderline inferolateral ST depression.   Echocardiogram  TTE pending    ASSESSMENT AND PLAN  1. Likely vasovagal episode: Transient bradycardia and hypotension - Began with vomiting and stopped when dry-heaving stopped - Not  currently receiving AV nodal blocking agents  2. CAD s/p PCI to RCA in 2001: - Most recent cath was 03/22/2000 showing patent coronary arteries and a widely patent mid-RCA stent.  - Has not follow up with cardiology since that time. No significant coronary symptoms since that time.  - No chest pain, dyspnea, or activity intolerance leading up to this hospitalization.  - Troponin negative in ED. Trend cardiac biomarkers - ECG at 1319 showed SR with new RBBB. Repeat ECG at 1323 showing narrow QRS SB with inferolateral ST depression, slightly more prominent than prior ECG in 2013 despite lack of any anginal symptom.   - Reduce ASA to 81 mg daily. Continue Zocor - No BB at this time due to transient bradycardia.  - TTE pending to evaluate LV function. Crackles and S3 on exam, pt likely will go into HF if continue aggressive IVF, carefully monitor volume status with fluid resuscitation.   3. Diabetic ketoacidosis: - No obvious cardiovascular event to provoke DKA. Minimal EKG finding, but no obvious symptom to correlate   4. IDDM  5. GERD  6. Hx CVA/small vessel dz  7. HLD:  - Continue Zocor    Signed, Azalee Course PA Pager: [phone]

## 2014-12-12 NOTE — ED Notes (Signed)
Cardiology at bedside.

## 2014-12-12 NOTE — Care Management Note (Signed)
Case Management Note  Patient Details  Name: Beth Payne MRN: [id number] Date of Birth: 03/07/1941  Subjective/Objective:                 Pt from home alone admitted with DKA.   Action/Plan:  Return to home when medically stable. CM to f/u with d/c disposition. Expected Discharge Date:                  Expected Discharge Plan:  Home/Self Care  In-House Referral:     Discharge planning Services  CM Consult  Post Acute Care Choice:    Choice offered to:     DME Arranged:    DME Agency:     HH Arranged:    HH Agency:     Status of Service:  In process, will continue to follow  Medicare Important Message Given:    Date Medicare IM Given:    Medicare IM give by:    Date Additional Medicare IM Given:    Additional Medicare Important Message give by:     If discussed at Long Length of Stay Meetings, dates discussed:    Additional Comments: Donna BernardRon [facility](Friend)  [phone]  [name], [name] Hudson, RN 12/12/2014, 7:05 PM

## 2014-12-12 NOTE — ED Notes (Signed)
CBG [id number] 

## 2014-12-13 DIAGNOSIS — E860 Dehydration: Secondary | ICD-10-CM

## 2014-12-13 DIAGNOSIS — D649 Anemia, unspecified: Secondary | ICD-10-CM

## 2014-12-13 DIAGNOSIS — I272 Pulmonary hypertension, unspecified: Secondary | ICD-10-CM | POA: Diagnosis present

## 2014-12-13 DIAGNOSIS — I270 Primary pulmonary hypertension: Secondary | ICD-10-CM

## 2014-12-13 DIAGNOSIS — I2510 Atherosclerotic heart disease of native coronary artery without angina pectoris: Secondary | ICD-10-CM | POA: Diagnosis present

## 2014-12-13 DIAGNOSIS — R001 Bradycardia, unspecified: Secondary | ICD-10-CM | POA: Diagnosis present

## 2014-12-13 DIAGNOSIS — I214 Non-ST elevation (NSTEMI) myocardial infarction: Secondary | ICD-10-CM

## 2014-12-13 DIAGNOSIS — N179 Acute kidney failure, unspecified: Secondary | ICD-10-CM | POA: Diagnosis present

## 2014-12-13 DIAGNOSIS — E785 Hyperlipidemia, unspecified: Secondary | ICD-10-CM

## 2014-12-13 HISTORY — DX: Non-ST elevation (NSTEMI) myocardial infarction: I21.4

## 2014-12-13 LAB — BASIC METABOLIC PANEL
Anion gap: 6 (ref 5–15)
Anion gap: 8 (ref 5–15)
BUN: 18 mg/dL (ref 6–20)
BUN: 14 mg/dL (ref 6–20)
CALCIUM: 9 mg/dL (ref 8.9–10.3)
CO2: 22 mmol/L (ref 22–32)
CO2: 21 mmol/L — ABNORMAL LOW (ref 22–32)
CREATININE: 0.95 mg/dL (ref 0.44–1.00)
Calcium: 9.2 mg/dL (ref 8.9–10.3)
Chloride: 110 mmol/L (ref 101–111)
Chloride: 108 mmol/L (ref 101–111)
Creatinine, Ser: 0.96 mg/dL (ref 0.44–1.00)
GFR calc Af Amer: >60 mL/min (ref >60)
GFR, EST NON AFRICAN AMERICAN: 58 mL/min — AB (ref >60)
GFR, EST NON AFRICAN AMERICAN: 57 mL/min — AB (ref >60)
GLUCOSE: 101 mg/dL — AB (ref 65–99)
GLUCOSE: 113 mg/dL — AB (ref 65–99)
POTASSIUM: 3.7 mmol/L (ref 3.5–5.1)
Potassium: 3.4 mmol/L — ABNORMAL LOW (ref 3.5–5.1)
SODIUM: 137 mmol/L (ref 135–145)
Sodium: 138 mmol/L (ref 135–145)

## 2014-12-13 LAB — GLUCOSE, CAPILLARY
GLUCOSE-CAPILLARY: 114 mg/dL — AB (ref 65–99)
GLUCOSE-CAPILLARY: 115 mg/dL — AB (ref 65–99)
GLUCOSE-CAPILLARY: 244 mg/dL — AB (ref 65–99)
GLUCOSE-CAPILLARY: 98 mg/dL (ref 65–99)
Glucose-Capillary: 99 mg/dL (ref 65–99)
Glucose-Capillary: 108 mg/dL — ABNORMAL HIGH (ref 65–99)
Glucose-Capillary: 122 mg/dL — ABNORMAL HIGH (ref 65–99)
Glucose-Capillary: 211 mg/dL — ABNORMAL HIGH (ref 65–99)
Glucose-Capillary: 168 mg/dL — ABNORMAL HIGH (ref 65–99)

## 2014-12-13 LAB — HEMOGLOBIN A1C
HEMOGLOBIN A1C: 6.6 % — AB (ref 4.8–5.6)
MEAN PLASMA GLUCOSE: 143 mg/dL

## 2014-12-13 LAB — TROPONIN I
Troponin I: 0.73 ng/mL (ref <0.031)
Troponin I: 0.85 ng/mL
Troponin I: 0.79 ng/mL

## 2014-12-13 MED ORDER — INSULIN ASPART 100 UNIT/ML ~~LOC~~ SOLN
1.0000 [IU] | Freq: Three times a day (TID) | SUBCUTANEOUS | Status: DC
  Administered 2014-12-13 – 2014-12-14 (×2): 1 [IU] via SUBCUTANEOUS
  Administered 2014-12-14: 2 [IU] via SUBCUTANEOUS
  Administered 2014-12-15 (×2): 6 [IU] via SUBCUTANEOUS

## 2014-12-13 MED ORDER — ROSUVASTATIN CALCIUM 10 MG PO TABS
40.0000 mg | ORAL_TABLET | Freq: Every day | ORAL | Status: DC
  Administered 2014-12-13 – 2014-12-16 (×4): 40 mg via ORAL
  Filled 2014-12-13: qty 4
  Filled 2014-12-13: qty 1
  Filled 2014-12-13 (×3): qty 4

## 2014-12-13 MED ORDER — INSULIN GLARGINE 100 UNIT/ML ~~LOC~~ SOLN
10.0000 [IU] | Freq: Once | SUBCUTANEOUS | Status: AC
  Administered 2014-12-13: 10 [IU] via SUBCUTANEOUS
  Filled 2014-12-13: qty 0.1

## 2014-12-13 MED ORDER — INSULIN ASPART 100 UNIT/ML ~~LOC~~ SOLN: 0.0000 [IU] | Freq: Every day | SUBCUTANEOUS | Status: DC

## 2014-12-13 MED ORDER — SODIUM CHLORIDE 0.9 % IV SOLN
INTRAVENOUS | Status: DC
  Administered 2014-12-13 – 2014-12-14 (×2): via INTRAVENOUS
  Administered 2014-12-14: 75 mL/h via INTRAVENOUS
  Administered 2014-12-15: 07:00:00 via INTRAVENOUS

## 2014-12-13 MED ORDER — INSULIN GLARGINE 100 UNIT/ML ~~LOC~~ SOLN
12.0000 [IU] | Freq: Every day | SUBCUTANEOUS | Status: DC
  Administered 2014-12-13 – 2014-12-14 (×2): 12 [IU] via SUBCUTANEOUS
  Filled 2014-12-13 (×3): qty 0.12

## 2014-12-13 MED ORDER — INSULIN ASPART 100 UNIT/ML ~~LOC~~ SOLN
0.0000 [IU] | Freq: Three times a day (TID) | SUBCUTANEOUS | Status: DC
  Administered 2014-12-13 – 2014-12-14 (×2): 2 [IU] via SUBCUTANEOUS
  Administered 2014-12-15: 3 [IU] via SUBCUTANEOUS
  Administered 2014-12-16: 2 [IU] via SUBCUTANEOUS
  Administered 2014-12-16: 4 [IU] via SUBCUTANEOUS
  Administered 2014-12-16: 3 [IU] via SUBCUTANEOUS

## 2014-12-13 MED ORDER — INSULIN ASPART 100 UNIT/ML ~~LOC~~ SOLN
0.0000 [IU] | SUBCUTANEOUS | Status: DC
  Administered 2014-12-13: 1 [IU] via SUBCUTANEOUS
  Administered 2014-12-13: 3 [IU] via SUBCUTANEOUS

## 2014-12-13 NOTE — Progress Notes (Signed)
CRITICAL VALUE ALERT  Critical value received:  Troponin 0.73  Date of notification:  12/13/2014  Time of notification:  0300  Critical value read back:Yes.    Nurse who received alert:  K. Vincenza HewsUssery  MD notified (1st page):  [place], FNP  Time of first page:  0300  MD notified (2nd page):  Time of second page:  Responding MD:  Withrow  Time MD responded:  [phone]  New orders received to complete 12L EKG

## 2014-12-13 NOTE — Progress Notes (Addendum)
Inpatient Diabetes Program Recommendations  AACE/ADA: New Consensus Statement on Inpatient Glycemic Control (2013)  Target Ranges:  Prepandial:   less than 140 mg/dL      Peak postprandial:   less than 180 mg/dL (1-2 hours)      Critically ill patients:  140 - 180 mg/dL   Inpatient Diabetes Program Recommendations Insulin - Basal: Lantus 12 units  Correction (SSI): recommend custom scale  Insulin - Meal Coverage: 1 unit for every 15 grams of carbohydrate (pt will calculate for the RN)  Note: this coordinator met with patient to evaluate insulin pump for errors and insulin rates.  The 1-800# was called and the technician walked me through the steps to trouble shoot what happened.  The pump checks out fine BUT the infusion site had a 90 degree bent cannula.  This is the most likely cause of the problem.  The pump manufacturer is sending a biohazard package (to her home) for the patient to mail the bent infusion site back to them for evaluation.  Patient can either restart her insulin pump if a friend brings her supplies from home OR she can be maintained on basal/bolus therapy until she can follow-up with her PCP (Dr Reynold Bowen) about this admission.   Total basal rate is: 11.9  Carb coverage is 1:15 (pt can calculate the carbs for the RN to dose the coverage) Correction factor is 1:50 so a custom scale is recommended as follows: Novolog  121-150=0 units 151-200=1 unit 201-250=2 units  251-300=3 units 301-350=4 units 351-400=6 units >400 give 6 units and call MD Above recommendations were called to Dr. Sherral Hammers.  ADD: pt's insulin pump and site are in a biohazard bag in her personal belongings bag in her room 3S01 Thank you  Raoul Pitch BSN, RN,CDE Inpatient Diabetes Coordinator [phone] (team pager)

## 2014-12-13 NOTE — Progress Notes (Addendum)
DAILY PROGRESS NOTE  Subjective:  Transient bradycardia this morning. Asymptomatic. Troponin elevated to 0.34 -> 0.73 - etiology is unclear, however, may suggest progression of underlying CAD in the setting of hyperglycemia. Echo yesterday shows normal LVEF of 55-60% and mild pulmonary hypertension.  Objective:  Temp:  [98 F (36.7 C)-98.6 F (37 C)] 98 F (36.7 C) (07/08 1200) Pulse Rate:  [65-87] 70 (07/08 0757) Resp:  [10-28] 10 (07/08 0757) BP: (94-159)/(34-51) 159/50 mmHg (07/08 0757) SpO2:  [97 %-100 %] 100 % (07/08 0757) Weight:  [112 lb 14 oz (51.2 kg)] 112 lb 14 oz (51.2 kg) (07/07 1803) Weight change:   Intake/Output from previous day: 07/07 0701 - 07/08 0700 In: 3645.4 [I.V.:3645.4] Out: -   Intake/Output from this shift:    Medications: Current Facility-Administered Medications  Medication Dose Route Frequency Provider Last Rate Last Dose  . 0.9 %  sodium chloride infusion   Intravenous Continuous Rogelia Mire, NP 10 mL/hr at 12/13/14 1012    . aspirin EC tablet 325 mg  325 mg Oral Daily Samella Parr, NP   325 mg at 12/13/14 1015  . dextrose 5 %-0.45 % sodium chloride infusion   Intravenous Continuous Samella Parr, NP   Stopped at 12/13/14 [phone]  . enoxaparin (LOVENOX) injection 40 mg  40 mg Subcutaneous Q24H Samella Parr, NP   40 mg at 12/12/14 1500  . insulin aspart (novoLOG) injection 0-9 Units  0-9 Units Subcutaneous 6 times per day Benjamine Mola, FNP   3 Units at 12/13/14 1200  . multivitamin with minerals tablet 1 tablet  1 tablet Oral Daily Samella Parr, NP   1 tablet at 12/13/14 1016  . pantoprazole (PROTONIX) EC tablet 40 mg  40 mg Oral Daily Samella Parr, NP   40 mg at 12/13/14 1017  . rosuvastatin (CRESTOR) tablet 5 mg  5 mg Oral q1800 Samella Parr, NP      . sertraline (ZOLOFT) tablet 150 mg  150 mg Oral Daily Samella Parr, NP   150 mg at 12/13/14 1014  . sodium chloride 0.9 % bolus 1,000 mL  1,000 mL Intravenous Once  Costin Karlyne Greenspan, MD        Physical Exam: General appearance: alert and no distress Neck: no carotid bruit and no JVD Lungs: clear to auscultation bilaterally Heart: regular rate and rhythm, S1, S2 normal, no murmur, click, rub or gallop Abdomen: soft, non-tender; bowel sounds normal; no masses,  no organomegaly Extremities: extremities normal, atraumatic, no cyanosis or edema Pulses: 2+ and symmetric Skin: Skin color, texture, turgor normal. No rashes or lesions Neurologic: Grossly normal Psych: Pleasant  Lab Results: Results for orders placed or performed during the hospital encounter of 12/12/14 (from the past 48 hour(s))  CBG monitoring, ED     Status: Abnormal   Collection Time: 12/12/14 10:03 AM  Result Value Ref Range   Glucose-Capillary 536 (H) 65 - 99 mg/dL   Comment 1 Notify RN    Comment 2 Document in Chart   Comprehensive metabolic panel     Status: Abnormal   Collection Time: 12/12/14 10:10 AM  Result Value Ref Range   Sodium 135 135 - 145 mmol/L   Potassium 4.6 3.5 - 5.1 mmol/L   Chloride 101 101 - 111 mmol/L   CO2 18 (L) 22 - 32 mmol/L   Glucose, Bld 547 (H) 65 - 99 mg/dL   BUN 21 (H) 6 - 20 mg/dL   Creatinine,  Ser 1.60 (H) 0.44 - 1.00 mg/dL   Calcium 10.0 8.9 - 10.3 mg/dL   Total Protein 6.9 6.5 - 8.1 g/dL   Albumin 4.2 3.5 - 5.0 g/dL   AST 27 15 - 41 U/L   ALT 25 14 - 54 U/L   Alkaline Phosphatase 60 38 - 126 U/L   Total Bilirubin 1.1 0.3 - 1.2 mg/dL   GFR calc non Af Amer 31 (L) >60 mL/min   GFR calc Af Amer 36 (L) >60 mL/min    Comment: (NOTE) The eGFR has been calculated using the CKD EPI equation. This calculation has not been validated in all clinical situations. eGFR's persistently <60 mL/min signify possible Chronic Kidney Disease.    Anion gap 16 (H) 5 - 15  Lipase, blood     Status: Abnormal   Collection Time: 12/12/14 10:10 AM  Result Value Ref Range   Lipase 21 (L) 22 - 51 U/L  CBC with Differential     Status: Abnormal   Collection  Time: 12/12/14 10:10 AM  Result Value Ref Range   WBC 7.5 4.0 - 10.5 K/uL   RBC 3.76 (L) 3.87 - 5.11 MIL/uL   Hemoglobin 10.4 (L) 12.0 - 15.0 g/dL   HCT 32.1 (L) 36.0 - 46.0 %   MCV 85.4 78.0 - 100.0 fL   MCH 27.7 26.0 - 34.0 pg   MCHC 32.4 30.0 - 36.0 g/dL   RDW 14.8 11.5 - 15.5 %   Platelets 196 150 - 400 K/uL   Neutrophils Relative % 83 (H) 43 - 77 %   Neutro Abs 6.2 1.7 - 7.7 K/uL   Lymphocytes Relative 14 12 - 46 %   Lymphs Abs 1.1 0.7 - 4.0 K/uL   Monocytes Relative 3 3 - 12 %   Monocytes Absolute 0.2 0.1 - 1.0 K/uL   Eosinophils Relative 0 0 - 5 %   Eosinophils Absolute 0.0 0.0 - 0.7 K/uL   Basophils Relative 0 0 - 1 %   Basophils Absolute 0.0 0.0 - 0.1 K/uL  I-Stat venous blood gas, ED     Status: Abnormal   Collection Time: 12/12/14 10:19 AM  Result Value Ref Range   pH, Ven 7.253 7.250 - 7.300   pCO2, Ven 41.0 (L) 45.0 - 50.0 mmHg   pO2, Ven 40.0 30.0 - 45.0 mmHg   Bicarbonate 18.1 (L) 20.0 - 24.0 mEq/L   TCO2 19 0 - 100 mmol/L   O2 Saturation 67.0 %   Acid-base deficit 9.0 (H) 0.0 - 2.0 mmol/L   Sample type VENOUS   I-Stat CG4 Lactic Acid, ED     Status: None   Collection Time: 12/12/14 10:20 AM  Result Value Ref Range   Lactic Acid, Venous 1.94 0.5 - 2.0 mmol/L  Urinalysis, Routine w reflex microscopic (not at [facility])     Status: Abnormal   Collection Time: 12/12/14 11:30 AM  Result Value Ref Range   Color, Urine YELLOW YELLOW   APPearance CLEAR CLEAR   Specific Gravity, Urine 1.021 1.005 - 1.030   pH 5.0 5.0 - 8.0   Glucose, UA >1000 (A) NEGATIVE mg/dL   Hgb urine dipstick NEGATIVE NEGATIVE   Bilirubin Urine NEGATIVE NEGATIVE   Ketones, ur 40 (A) NEGATIVE mg/dL   Protein, ur NEGATIVE NEGATIVE mg/dL   Urobilinogen, UA 0.2 0.0 - 1.0 mg/dL   Nitrite NEGATIVE NEGATIVE   Leukocytes, UA NEGATIVE NEGATIVE  Urine microscopic-add on     Status: Abnormal   Collection Time: 12/12/14  11:30 AM  Result Value Ref Range   Squamous Epithelial / LPF RARE RARE   Casts  HYALINE CASTS (A) NEGATIVE  CBG monitoring, ED     Status: Abnormal   Collection Time: 12/12/14 12:32 PM  Result Value Ref Range   Glucose-Capillary 538 (H) 65 - 99 mg/dL  TSH     Status: None   Collection Time: 12/12/14 12:35 PM  Result Value Ref Range   TSH 1.192 0.350 - 4.500 uIU/mL  Vitamin B12     Status: None   Collection Time: 12/12/14 12:35 PM  Result Value Ref Range   Vitamin B-12 661 180 - 914 pg/mL    Comment: (NOTE) This assay is not validated for testing neonatal or myeloproliferative syndrome specimens for Vitamin B12 levels.   Folate     Status: None   Collection Time: 12/12/14 12:35 PM  Result Value Ref Range   Folate 32.5 >5.9 ng/mL  Iron and TIBC     Status: Abnormal   Collection Time: 12/12/14 12:35 PM  Result Value Ref Range   Iron 144 28 - 170 ug/dL   TIBC 291 250 - 450 ug/dL   Saturation Ratios 49 (H) 10.4 - 31.8 %   UIBC 147 ug/dL  Ferritin     Status: None   Collection Time: 12/12/14 12:35 PM  Result Value Ref Range   Ferritin 28 11 - 307 ng/mL  Reticulocytes     Status: Abnormal   Collection Time: 12/12/14 12:35 PM  Result Value Ref Range   Retic Ct Pct 1.0 0.4 - 3.1 %   RBC. 3.58 (L) 3.87 - 5.11 MIL/uL   Retic Count, Manual 35.8 19.0 - 186.0 K/uL  Troponin I (q 6hr x 3)     Status: None   Collection Time: 12/12/14  1:20 PM  Result Value Ref Range   Troponin I <0.03 <0.031 ng/mL    Comment:        NO INDICATION OF MYOCARDIAL INJURY.   Basic metabolic panel (stat then every 4 hours)     Status: Abnormal   Collection Time: 12/12/14  1:20 PM  Result Value Ref Range   Sodium 135 135 - 145 mmol/L   Potassium 5.0 3.5 - 5.1 mmol/L   Chloride 103 101 - 111 mmol/L   CO2 14 (L) 22 - 32 mmol/L   Glucose, Bld 560 (HH) 65 - 99 mg/dL    Comment: REPEATED TO VERIFY CRITICAL RESULT CALLED TO, READ BACK BY AND VERIFIED WITH: D.HUGHES,RN 1354 12/12/14 CLARK,S    BUN 27 (H) 6 - 20 mg/dL   Creatinine, Ser 1.72 (H) 0.44 - 1.00 mg/dL   Calcium 9.6 8.9 -  10.3 mg/dL   GFR calc non Af Amer 28 (L) >60 mL/min   GFR calc Af Amer 33 (L) >60 mL/min    Comment: (NOTE) The eGFR has been calculated using the CKD EPI equation. This calculation has not been validated in all clinical situations. eGFR's persistently <60 mL/min signify possible Chronic Kidney Disease.    Anion gap 18 (H) 5 - 15  Magnesium     Status: None   Collection Time: 12/12/14  1:20 PM  Result Value Ref Range   Magnesium 1.9 1.7 - 2.4 mg/dL  Phosphorus     Status: None   Collection Time: 12/12/14  1:20 PM  Result Value Ref Range   Phosphorus 4.0 2.5 - 4.6 mg/dL  Hemoglobin A1c     Status: Abnormal   Collection Time: 12/12/14  1:20  PM  Result Value Ref Range   Hgb A1c MFr Bld 6.6 (H) 4.8 - 5.6 %    Comment: (NOTE)         Pre-diabetes: 5.7 - 6.4         Diabetes: >6.4         Glycemic control for adults with diabetes: <7.0    Mean Plasma Glucose 143 mg/dL    Comment: (NOTE) Performed At: [facility] [place], [place] [id number] Lindon Romp MD AC:[id number]   CBG monitoring, ED     Status: Abnormal   Collection Time: 12/12/14  1:40 PM  Result Value Ref Range   Glucose-Capillary 459 (H) 65 - 99 mg/dL  CBG monitoring, ED     Status: Abnormal   Collection Time: 12/12/14  2:55 PM  Result Value Ref Range   Glucose-Capillary 389 (H) 65 - 99 mg/dL  CBG monitoring, ED     Status: Abnormal   Collection Time: 12/12/14  4:18 PM  Result Value Ref Range   Glucose-Capillary 334 (H) 65 - 99 mg/dL  CBG monitoring, ED     Status: Abnormal   Collection Time: 12/12/14  5:34 PM  Result Value Ref Range   Glucose-Capillary 249 (H) 65 - 99 mg/dL  MRSA PCR Screening     Status: None   Collection Time: 12/12/14  5:57 PM  Result Value Ref Range   MRSA by PCR NEGATIVE NEGATIVE    Comment:        The GeneXpert MRSA Assay (FDA approved for NASAL specimens only), is one component of a comprehensive MRSA colonization surveillance program. It is  not intended to diagnose MRSA infection nor to guide or monitor treatment for MRSA infections.   Glucose, capillary     Status: Abnormal   Collection Time: 12/12/14  6:29 PM  Result Value Ref Range   Glucose-Capillary 222 (H) 65 - 99 mg/dL  Troponin I (q 6hr x 3)     Status: Abnormal   Collection Time: 12/12/14  6:40 PM  Result Value Ref Range   Troponin I 0.34 (H) <0.031 ng/mL    Comment:        PERSISTENTLY INCREASED TROPONIN VALUES IN THE RANGE OF 0.04-0.49 ng/mL CAN BE SEEN IN:       -UNSTABLE ANGINA       -CONGESTIVE HEART FAILURE       -MYOCARDITIS       -CHEST TRAUMA       -ARRYHTHMIAS       -LATE PRESENTING MYOCARDIAL INFARCTION       -COPD   CLINICAL FOLLOW-UP RECOMMENDED.   Basic metabolic panel (stat then every 4 hours)     Status: Abnormal   Collection Time: 12/12/14  6:40 PM  Result Value Ref Range   Sodium 138 135 - 145 mmol/L   Potassium 3.5 3.5 - 5.1 mmol/L   Chloride 109 101 - 111 mmol/L   CO2 21 (L) 22 - 32 mmol/L   Glucose, Bld 246 (H) 65 - 99 mg/dL   BUN 21 (H) 6 - 20 mg/dL   Creatinine, Ser 1.23 (H) 0.44 - 1.00 mg/dL   Calcium 9.4 8.9 - 10.3 mg/dL   GFR calc non Af Amer 42 (L) >60 mL/min   GFR calc Af Amer 49 (L) >60 mL/min    Comment: (NOTE) The eGFR has been calculated using the CKD EPI equation. This calculation has not been validated in all clinical situations. eGFR's persistently <60 mL/min signify possible  Chronic Kidney Disease.    Anion gap 8 5 - 15  Glucose, capillary     Status: Abnormal   Collection Time: 12/12/14  7:32 PM  Result Value Ref Range   Glucose-Capillary 212 (H) 65 - 99 mg/dL  Glucose, capillary     Status: Abnormal   Collection Time: 12/12/14  8:40 PM  Result Value Ref Range   Glucose-Capillary 172 (H) 65 - 99 mg/dL  Basic metabolic panel (stat then every 4 hours)     Status: Abnormal   Collection Time: 12/12/14  8:42 PM  Result Value Ref Range   Sodium 136 135 - 145 mmol/L   Potassium 3.2 (L) 3.5 - 5.1 mmol/L    Chloride 107 101 - 111 mmol/L   CO2 22 22 - 32 mmol/L   Glucose, Bld 203 (H) 65 - 99 mg/dL   BUN 21 (H) 6 - 20 mg/dL   Creatinine, Ser 1.16 (H) 0.44 - 1.00 mg/dL   Calcium 9.2 8.9 - 10.3 mg/dL   GFR calc non Af Amer 46 (L) >60 mL/min   GFR calc Af Amer 53 (L) >60 mL/min    Comment: (NOTE) The eGFR has been calculated using the CKD EPI equation. This calculation has not been validated in all clinical situations. eGFR's persistently <60 mL/min signify possible Chronic Kidney Disease.    Anion gap 7 5 - 15  Glucose, capillary     Status: Abnormal   Collection Time: 12/12/14  9:40 PM  Result Value Ref Range   Glucose-Capillary 164 (H) 65 - 99 mg/dL  Glucose, capillary     Status: Abnormal   Collection Time: 12/12/14 10:46 PM  Result Value Ref Range   Glucose-Capillary 137 (H) 65 - 99 mg/dL  Glucose, capillary     Status: Abnormal   Collection Time: 12/12/14 11:41 PM  Result Value Ref Range   Glucose-Capillary 115 (H) 65 - 99 mg/dL  Glucose, capillary     Status: None   Collection Time: 12/13/14 12:38 AM  Result Value Ref Range   Glucose-Capillary 98 65 - 99 mg/dL  Troponin I (q 6hr x 3)     Status: Abnormal   Collection Time: 12/13/14 12:42 AM  Result Value Ref Range   Troponin I 0.73 (HH) <0.031 ng/mL    Comment:        POSSIBLE MYOCARDIAL ISCHEMIA. SERIAL TESTING RECOMMENDED. REPEATED TO VERIFY CRITICAL RESULT CALLED TO, READ BACK BY AND VERIFIED WITH: RICHARD [facility] 12/13/14 0150 WAYK   Basic metabolic panel (stat then every 4 hours)     Status: Abnormal   Collection Time: 12/13/14 12:42 AM  Result Value Ref Range   Sodium 138 135 - 145 mmol/L   Potassium 3.7 3.5 - 5.1 mmol/L   Chloride 110 101 - 111 mmol/L   CO2 22 22 - 32 mmol/L   Glucose, Bld 101 (H) 65 - 99 mg/dL   BUN 18 6 - 20 mg/dL   Creatinine, Ser 0.95 0.44 - 1.00 mg/dL   Calcium 9.2 8.9 - 10.3 mg/dL   GFR calc non Af Amer 58 (L) >60 mL/min   GFR calc Af Amer >60 >60 mL/min    Comment: (NOTE) The eGFR  has been calculated using the CKD EPI equation. This calculation has not been validated in all clinical situations. eGFR's persistently <60 mL/min signify possible Chronic Kidney Disease.    Anion gap 6 5 - 15  Glucose, capillary     Status: None   Collection Time: 12/13/14  1:50  AM  Result Value Ref Range   Glucose-Capillary 99 65 - 99 mg/dL  Glucose, capillary     Status: Abnormal   Collection Time: 12/13/14  2:52 AM  Result Value Ref Range   Glucose-Capillary 114 (H) 65 - 99 mg/dL  Glucose, capillary     Status: Abnormal   Collection Time: 12/13/14  3:43 AM  Result Value Ref Range   Glucose-Capillary 108 (H) 65 - 99 mg/dL  Basic metabolic panel (stat then every 4 hours)     Status: Abnormal   Collection Time: 12/13/14  4:44 AM  Result Value Ref Range   Sodium 137 135 - 145 mmol/L   Potassium 3.4 (L) 3.5 - 5.1 mmol/L   Chloride 108 101 - 111 mmol/L   CO2 21 (L) 22 - 32 mmol/L   Glucose, Bld 113 (H) 65 - 99 mg/dL   BUN 14 6 - 20 mg/dL   Creatinine, Ser 0.96 0.44 - 1.00 mg/dL   Calcium 9.0 8.9 - 10.3 mg/dL   GFR calc non Af Amer 57 (L) >60 mL/min   GFR calc Af Amer >60 >60 mL/min    Comment: (NOTE) The eGFR has been calculated using the CKD EPI equation. This calculation has not been validated in all clinical situations. eGFR's persistently <60 mL/min signify possible Chronic Kidney Disease.    Anion gap 8 5 - 15  Glucose, capillary     Status: Abnormal   Collection Time: 12/13/14  7:58 AM  Result Value Ref Range   Glucose-Capillary 122 (H) 65 - 99 mg/dL  Glucose, capillary     Status: Abnormal   Collection Time: 12/13/14 12:53 PM  Result Value Ref Range   Glucose-Capillary 244 (H) 65 - 99 mg/dL    Imaging: Dg Chest Port 1 View  12/12/2014   CLINICAL DATA:  DKA  EXAM: PORTABLE CHEST - 1 VIEW  COMPARISON:  09/08/2011  FINDINGS: Mild cardiac enlargement. Right Coronary artery stent. Mild vascular congestion without edema.  Mild bibasilar atelectasis/ infiltrate.  No  effusion.  IMPRESSION: Mild vascular congestion without edema  Mild bibasilar atelectasis/infiltrate.   Electronically Signed   By: Franchot Gallo M.D.   On: 12/12/2014 13:30    Assessment:  Principal Problem:   DKA (diabetic ketoacidoses) Active Problems:   Acute renal failure   Dehydration   GERD (gastroesophageal reflux disease)   CAD s/p stent to mid-RCA in 2001.   Systolic murmur   Normocytic anemia   Dyslipidemia   NSTEMI (non-ST elevated myocardial infarction)   Plan:  1. Bradycardia - could be vasovagal with vomiting and DKA, however, possible CAD is an explanation. Had some more bradycardia this morning. Avoid AVN blockers and continue telemetry. 2. NSTEMI - troponin elevated and rising - recommended adding heparin, but she is worried about "retinal leakage" - which I clarified is bleeding and refused heparin or additional blood thinners. Continue full dose aspirin for now. Increase crestor to 40 mg daily for ACS. No chest pain, but given Type 1 diabetes for over 50 years, she may not have chest pain symptoms from ischemia. Echo yesterday shows normal systolic function without wall motion abnormalities. Would recommend nuclear stress test, if she is agreeable. 3. DKA - improved. BG now within normal range- gap has closed. Renal function appears normal  Time Spent Directly with Patient:  15 minutes  Length of Stay:  LOS: 1 day   Beth Casino, MD, [facility] Attending Cardiologist [place] 12/13/2014, 1:31 PM

## 2014-12-13 NOTE — Progress Notes (Signed)
Numerous CBG's in acceptable range and Ion gap has been closed (last was 7). RN reports that pt is doing well. Protocol dictates that pt may come off insulin infusion. Pt cannot recall the dosing of her insulin pump and instructions/information on how to operate it are not available at this time. Therefore, will proceed as follows:  Plan: -Lantus 10 units now (long-acting insulin per policy for cessation of infusion; pt uses a pump at home) -Sliding scale Novolog (sensitive) q4h with CBG -Check CBG in 1 hour, then again 1 hour after. Then discontinue pump if WNL.  -Full liquid diet; advance as tolerated  Beau FannyWithrow, Kourtnee Lahey C, [place]NP 12/13/2014

## 2014-12-13 NOTE — Progress Notes (Signed)
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  Presents to the ER today with progressive hyperglycemia and associated nausea/vomiting and fatigue. Patient reports yesterday morning that she noticed initial CBG was 311; repeated several hours later and it was 314 so she changed out her insulin pump set up. Unfortunately her CBG was up to 500s this morning and she developed symptoms of nausea, fatigue and emesis. Patient reports she has been on an insulin pump for at least 16 years and typically does not have problems with utilization of the pump. She reports that the insulin that she places in the pump is up-to-date and has not expired. She is not having any other symptoms such as chest pain, shortness of breath, dysuria or urinary frequency, no cough fevers or chills.  In the ER patient was afebrile, BP was 101/38 with subsequent dip in blood pressure to 83/33 and decrease in heart rate from 81 to 56 associated with that hypotensive episode. She was subsequently given a fluid challenge by the ER physician. She was maintaining saturations of 99-100% on room air. Blood glucose was 538 with a serum CO2 of 18 and an anion gap of 16 so patient was placed on an insulin infusion. Her lactic acid was 1.94. She also had acute kidney injury with a BUN of 21 and creatinine of 1.6 with baseline renal function of 12 and 0.8. CBC was normal except for hemoglobin of 10.4 with last known hemoglobin 12.9 in 2013.  HPI/Subjective: 7/8 A/O 4, states positive nausea, negative vomiting. States still feels fatigued. Has been a diabetic for over 64 years. States pump was last checked~4 weeks ago by Dr. Hyacinth Meeker.    Assessment/Plan: DKA/Diabetes type 2 controlled  -Continue insulin infusion -Diabetes educator consultation for pump check; would not resume home insulin pump until evaluated by the educator to determine if malfunctioning. Patient's home is extremely old may require new pump. -Anion gap closed -Start heart healthy car modified diet -7/7 Hemoglobin A1c= 6.6 -Suspect pump malfunction as etiology to DKA; Diabetic Coordinator contacted pump manufacture and determined, The pump checks out fine BUT the infusion site had a 90 degree bent cannula. -Patient placed on custom insulin scale.  Symptomatic bradycardia -Patient has had recurrent episodes of heart rates in the 40s and 50s without evidence of heart block but with systolic blood pressure dropping into the 70s and 80s -Review patient's MAR shows the patient was not on any medication prior to admission which should have affected her heart rate other than Abilify. Will hold while bradycardia evaluated -EMR-Have ordered repeat EKG while bradycardic -Continue telemetry monitoring -Continue normal saline 71ml/hr -Abilify held; Adverse reactions can include tachycardia/bradycardia -Per Cardiology, patient has refused heparin drip and nucleus stress test. If patient continues to refuse nuclear stress test would discharge in a.m.  LVH/pulmonary hypertension -Full dose aspirin -Increase Crestor to 40 mg daily   CAD in native artery; s/p stent to RCA (2012) -Possibility that DKA could be precipitated by occult cardiac ischemia; patient currently denying typical symptoms (however she is long standing diabetic) and has been having worsening nausea today, and in review of cath report patient was having exertional angina prior to stent placement in 2012 -Cardiac enzymes trending up -See symptomatic bradycardia  Systolic murmur -  Echocardiogram; LVH/pulmonary hypertension  Normocytic anemia -Downward trend in 3 years -Check anemia panel and  TSH -No reported melana or blood in stools  Acute renal failure/Dehydration -Secondary to DKA -IV fluids per protocol -Labs in a.m.  GERD -Continue PPI  Dyslipidemia -Increase Crestor to 40 mg daily     Code Status: FULL Family Communication: Friend present at time of exam Disposition Plan: DC in a.m. if patient refuses nuclear stress test.    Consultants: Dr.Kenneth Alona Bene. Hilty (cardiology)    Procedure/Significant Events: 7/7 echocardiogram; -moderate LVH. LVEF= 55%-60%. - Pulmonary arteries: PA peak pressure: 40 mm Hg (S).   Culture   Antibiotics:   DVT prophylaxis:    Devices    LINES / TUBES:      Continuous Infusions: . sodium chloride 10 mL/hr at 12/13/14 1012  . sodium chloride      Objective: VITAL SIGNS: Temp: 98.1 F (36.7 C) (07/08 1920) Temp Source: Oral (07/08 1920) BP: 146/56 mmHg (07/08 1920) Pulse Rate: 70 (07/08 1920) SPO2; FIO2:   Intake/Output Summary (Last 24 hours) at 12/13/14 1943 Last data filed at 12/13/14 0700  Gross per 24 hour  Intake 1995.41 ml  Output      0 ml  Net 1995.41 ml     Exam: General: A/O 4, NAD, No acute respiratory distress Eyes: Negative headache, eye pain, double vision, scotomas, floaters, negative retinal hemorrhage ENT: Negative Runny nose, negative ear pain, negative tinnitus, negative gingival bleeding Neck:  Negative scars, masses, torticollis, lymphadenopathy, JVD Lungs: Clear to auscultation bilaterally without wheezes or crackles Cardiovascular: Regular rate and rhythm without murmur gallop or rub normal S1 and S2 Abdomen:negative abdominal pain, negative dysphagia, Nontender, nondistended, soft, bowel sounds positive, no rebound, no ascites, no appreciable mass Extremities: No significant cyanosis, clubbing, or edema bilateral lower extremities Psychiatric:  Negative depression, negative anxiety, negative fatigue, negative mania Neurologic:  Cranial nerves II through XII intact,  tongue/uvula midline, all extremities muscle strength 5/5, sensation intact throughout,negative dysarthria, negative expressive aphasia, negative receptive aphasia.    Data Reviewed: Basic Metabolic Panel:  Recent Labs Lab 12/12/14 1320 12/12/14 1840 12/12/14 2042 12/13/14 0042 12/13/14 0444  NA 135 138 136 138 137  K 5.0 3.5 3.2* 3.7 3.4*  CL 103 109 107 110 108  CO2 14* 21* 22 22 21*  GLUCOSE 560* 246* 203* 101* 113*  BUN 27* 21* 21* 18 14  CREATININE 1.72* 1.23* 1.16* 0.95 0.96  CALCIUM 9.6 9.4 9.2 9.2 9.0  MG 1.9  --   --   --   --   PHOS 4.0  --   --   --   --    Liver Function Tests:  Recent Labs Lab 12/12/14 1010  AST 27  ALT 25  ALKPHOS 60  BILITOT 1.1  PROT 6.9  ALBUMIN 4.2    Recent Labs Lab 12/12/14 1010  LIPASE 21*   No results for input(s): AMMONIA in the last 168 hours. CBC:  Recent Labs Lab 12/12/14 1010  WBC 7.5  NEUTROABS 6.2  HGB 10.4*  HCT 32.1*  MCV 85.4  PLT 196   Cardiac Enzymes:  Recent Labs Lab 12/12/14 1320 12/12/14 1840 12/13/14 0042 12/13/14 1309 12/13/14 1827  TROPONINI <0.03 0.34* 0.73* 0.85* 0.79*   BNP (last 3 results) No results for input(s): BNP in the last 8760 hours.  ProBNP (last 3 results) No results for input(s): PROBNP in the last 8760 hours.  CBG:  Recent Labs Lab 12/13/14 0252 12/13/14 0343 12/13/14 0758 12/13/14  1253 12/13/14 1614  GLUCAP 114* 108* 122* 244* 211*    Recent Results (from the past 240 hour(s))  MRSA PCR Screening     Status: None   Collection Time: 12/12/14  5:57 PM  Result Value Ref Range Status   MRSA by PCR NEGATIVE NEGATIVE Final    Comment:        The GeneXpert MRSA Assay (FDA approved for NASAL specimens only), is one component of a comprehensive MRSA colonization surveillance program. It is not intended to diagnose MRSA infection nor to guide or monitor treatment for MRSA infections.      Studies:  Recent x-ray studies have been reviewed in detail by  the Attending Physician  Scheduled Meds:  Scheduled Meds: . aspirin  325 mg Oral Daily  . enoxaparin (LOVENOX) injection  40 mg Subcutaneous Q24H  . insulin aspart  0-6 Units Subcutaneous TID WC  . insulin aspart  1-6 Units Subcutaneous TID WC  . insulin glargine  12 Units Subcutaneous QHS  . multivitamin with minerals  1 tablet Oral Daily  . pantoprazole  40 mg Oral Daily  . rosuvastatin  40 mg Oral q1800  . sertraline  150 mg Oral Daily  . sodium chloride  1,000 mL Intravenous Once    Time spent on care of this patient: 40 mins   Langdon Crosson, Roselind Messier , MD  [facility] Office  [phone] Pager - [phone]  On-Call/Text Page:      [email or web address]      password [email or web address]  If 7PM-7AM, please contact night-coverage [email or web address] Password [email or web address] 12/13/2014, 7:43 PM   LOS: 1 day   Care during the described time interval was provided by me .  I have reviewed this patient's available data, including medical history, events of note, physical examination, and all test results as part of my evaluation. I have personally reviewed and interpreted all radiology studies.   Carolyne Littles, MD [phone] Pager

## 2014-12-14 DIAGNOSIS — N179 Acute kidney failure, unspecified: Secondary | ICD-10-CM

## 2014-12-14 LAB — GLUCOSE, CAPILLARY
GLUCOSE-CAPILLARY: 169 mg/dL — AB (ref 65–99)
GLUCOSE-CAPILLARY: 198 mg/dL — AB (ref 65–99)
GLUCOSE-CAPILLARY: 134 mg/dL — AB (ref 65–99)
Glucose-Capillary: 133 mg/dL — ABNORMAL HIGH (ref 65–99)
Glucose-Capillary: 135 mg/dL — ABNORMAL HIGH (ref 65–99)

## 2014-12-14 LAB — TROPONIN I: TROPONIN I: 0.67 ng/mL — AB (ref <0.031)

## 2014-12-14 NOTE — Progress Notes (Signed)
Patient Demographics  Beth Payne, is a 74 y.o. female, DOB - 10/13/1940, ZOX:[id number]  Admit date - 12/12/2014   Admitting Physician Costin Otelia Sergeant, MD  Outpatient Primary MD for the patient is [name], MD  LOS - 2   Chief Complaint  Patient presents with  . Hyperglycemia  . 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   Assessment & Plan    Principal Problem:   DKA (diabetic ketoacidoses) Active Problems:   Acute renal failure   Dehydration   GERD (gastroesophageal reflux disease)   CAD s/p stent to mid-RCA in 2001.   Systolic murmur   Normocytic anemia   Dyslipidemia   NSTEMI (non-ST elevated myocardial infarction)   CAD in native artery   Pulmonary hypertension   Symptomatic bradycardia   Acute renal failure syndrome  DKA/Diabetes  - most likely related to bent  cannula at the infusion site , the pump checked out fine , diabetic coordinator assistance greatly appreciated . - Initially on insulin drip, currently transitioned to Lantus, insulin sliding scale ,  -  Patient's pump is extremely old may require new pump.Follow with his endocrinologist as an outpatient Dr. [name]  -Anion gap closed -7/7 Hemoglobin A1c= 6.6 - CBGs currently acceptable on Lantus and custom sliding scale .  Symptomatic bradycardia -Most likely vasovagal from her vomiting and  DKA , avoid AVN blocking agents. -Continue telemetry monitoring -Abilify held; Adverse reactions can include tachycardia/bradycardia  Elevated troponins - Initially trending up , troponins peaked at 0.85 , currently trending down . - plan for nuclear stress test in a.m., cardiology consult appreciated .  CAD in native artery; s/p stent to RCA (2012) - on aspirin and statin, no beta blockers given her bradycardia .  Normocytic anemia - continue to monitor   Acute renal failure/Dehydration -Secondary to DKA and volume depletion. - resolved with IV fluid  Hyperlipidemia  -increase Crestor to 40 mg daily  Code Status: DNR  Family Communication: none at bedside  Disposition Plan: home when stable   Procedures   2 D eccho  Consults   cardiology   Medications  Scheduled Meds: . aspirin  325 mg Oral Daily  . enoxaparin (LOVENOX) injection  40 mg Subcutaneous Q24H  . insulin aspart  0-6 Units Subcutaneous TID WC  . insulin aspart  1-6 Units Subcutaneous TID WC  . insulin glargine  12 Units Subcutaneous QHS  . multivitamin with minerals  1 tablet Oral Daily  . pantoprazole  40 mg Oral Daily  . rosuvastatin  40 mg Oral q1800  . sertraline  150 mg Oral Daily  . sodium chloride  1,000 mL Intravenous Once   Continuous Infusions: . sodium chloride 10 mL/hr at 12/13/14 1012  . sodium chloride 75 mL/hr (  12/14/14 0424)   PRN Meds:.  DVT Prophylaxis  Lovenox -  Lab Results  Component Value Date   PLT 196 12/12/2014    Antibiotics   Anti-infectives    None          Objective:   Filed Vitals:   12/14/14 0038 12/14/14 0400 12/14/14 0736 12/14/14 1149  BP: 158/58 160/54 157/58 167/69  Pulse: 80 71 77 79  Temp: 98.8 F (37.1 C) 99.4 F (37.4 C) 99.6 F (37.6 C) 98.3 F (36.8 C)  TempSrc: Oral Oral Oral Oral  Resp: 18 18 16 15   Height: 5\' 2"  (1.575 m)     Weight: 55.203 kg (121 lb 11.2 oz) 54.296 kg (119 lb 11.2 oz)    SpO2: 98% 95% 97% 97%    Wt  Readings from Last 3 Encounters:  12/14/14 54.296 kg (119 lb 11.2 oz)     Intake/Output Summary (Last 24 hours) at 12/14/14 1308 Last data filed at 12/14/14 0900  Gross per 24 hour  Intake 488.75 ml  Output    700 ml  Net -211.25 ml     Physical Exam  Awake Alert, Oriented X 3, No new F.N deficits, Normal affect [place].AT,PERRAL Supple Neck,No JVD, No cervical lymphadenopathy appriciated.  Symmetrical Chest wall movement, Good air movement bilaterally, CTAB RRR,No Gallops,Rubs or new Murmurs, No Parasternal Heave +ve B.Sounds, Abd Soft, No tenderness, No organomegaly appriciated, No rebound - guarding or rigidity. No Cyanosis, Clubbing or edema, No new Rash or bruise     Data Review   Micro Results Recent Results (from the past 240 hour(s))  MRSA PCR Screening     Status: None   Collection Time: 12/12/14  5:57 PM  Result Value Ref Range Status   MRSA by PCR NEGATIVE NEGATIVE Final    Comment:        The GeneXpert MRSA Assay (FDA approved for NASAL specimens only), is one component of a comprehensive MRSA colonization surveillance program. It is not intended to diagnose MRSA infection nor to guide or monitor treatment for MRSA infections.     Radiology Reports Dg Chest Port 1 View  12/12/2014   CLINICAL DATA:  DKA  EXAM: PORTABLE CHEST - 1 VIEW  COMPARISON:  09/08/2011  FINDINGS: Mild cardiac enlargement. Right Coronary artery stent. Mild vascular congestion without edema.  Mild bibasilar atelectasis/ infiltrate.  No effusion.  IMPRESSION: Mild vascular congestion without edema  Mild bibasilar atelectasis/infiltrate.   Electronically Signed   By: Marlan Palauharles  Clark M.D.   On: 12/12/2014 13:30     CBC  Recent Labs Lab 12/12/14 1010  WBC 7.5  HGB 10.4*  HCT 32.1*  PLT 196  MCV 85.4  MCH 27.7  MCHC 32.4  RDW 14.8  LYMPHSABS 1.1  MONOABS 0.2  EOSABS 0.0  BASOSABS 0.0    Chemistries   Recent Labs Lab 12/12/14 1010 12/12/14 1320 12/12/14 1840 12/12/14 2042  12/13/14 0042 12/13/14 0444  NA 135 135 138 136 138 137  K 4.6 5.0 3.5 3.2* 3.7 3.4*  CL 101 103 109 107 110 108  CO2 18* 14* 21* 22 22 21*  GLUCOSE 547* 560* 246* 203* 101* 113*  BUN 21* 27* 21* 21* 18 14  CREATININE 1.60* 1.72* 1.23* 1.16* 0.95 0.96  CALCIUM 10.0 9.6 9.4 9.2 9.2 9.0  MG  --  1.9  --   --   --   --   AST 27  --   --   --   --   --  ALT 25  --   --   --   --   --   ALKPHOS 60  --   --   --   --   --   BILITOT 1.1  --   --   --   --   --    ------------------------------------------------------------------------------------------------------------------ estimated creatinine clearance is 41.3 mL/min (by C-G formula based on Cr of 0.96). ------------------------------------------------------------------------------------------------------------------  Recent Labs  12/12/14 1320  HGBA1C 6.6*   ------------------------------------------------------------------------------------------------------------------ No results for input(s): CHOL, HDL, LDLCALC, TRIG, CHOLHDL, LDLDIRECT in the last 72 hours. ------------------------------------------------------------------------------------------------------------------  Recent Labs  12/12/14 1235  TSH 1.192   ------------------------------------------------------------------------------------------------------------------  Recent Labs  12/12/14 1235  VITAMINB12 661  FOLATE 32.5  FERRITIN 28  TIBC 291  IRON 144  RETICCTPCT 1.0    Coagulation profile No results for input(s): INR, PROTIME in the last 168 hours.  No results for input(s): DDIMER in the last 72 hours.  Cardiac Enzymes  Recent Labs Lab 12/13/14 1309 12/13/14 1827 12/14/14 0007  TROPONINI 0.85* 0.79* 0.67*   ------------------------------------------------------------------------------------------------------------------ Invalid input(s): POCBNP     Time Spent in minutes   30 minutes   Zuria Fosdick M.D on 12/14/2014 at 1:08  PM  Between 7am to 7pm - Pager - [phone]  After 7pm go to [email or web address] - password [facility]  [facility]   Office  [phone]

## 2014-12-14 NOTE — Progress Notes (Signed)
Subjective:  Patient complains of low back and lower abdominal pain at this time.  She is not having any anginal pain or shortness of breath.  No significant bradycardia.  She evidently refuses intravenous heparin last night.  Troponin level is low at this time.  Echo showed normal LV and there were no acute changes on her EKG.  Objective:  Vital Signs in the last 24 hours: BP 157/58 mmHg  Pulse 77  Temp(Src) 99.6 F (37.6 C) (Oral)  Resp 16  Ht 5\' 2"  (1.575 m)  Wt 54.296 kg (119 lb 11.2 oz)  BMI 21.89 kg/m2  SpO2 97%  Physical Exam: Pleasant female in no acute distress Lungs:  Clear Cardiac:  Regular rhythm, normal S1 and S2, no S3 Extremities:  No edema present  Intake/Output from previous day: 07/08 0701 - 07/09 0700 In: 368.8 [I.V.:368.8] Out: 500 [Urine:500]  Weight Filed Weights   12/12/14 1803 12/14/14 0038 12/14/14 0400  Weight: 51.2 kg (112 lb 14 oz) 55.203 kg (121 lb 11.2 oz) 54.296 kg (119 lb 11.2 oz)    Lab Results: Basic Metabolic Panel:  Recent Labs  16/03/9606/08/16 0042 12/13/14 0444  NA 138 137  K 3.7 3.4*  CL 110 108  CO2 22 21*  GLUCOSE 101* 113*  BUN 18 14  CREATININE 0.95 0.96   CBC:  Recent Labs  12/12/14 1010  WBC 7.5  NEUTROABS 6.2  HGB 10.4*  HCT 32.1*  MCV 85.4  PLT 196    Cardiac Panel (last 3 results)  Recent Labs  12/13/14 1309 12/13/14 1827 12/14/14 0007  TROPONINI 0.85* 0.79* 0.67*    Telemetry: Currently normal sinus rhythm  Assessment/Plan:  1.  Mild elevation of troponin in setting of bradycardia and some mild low blood pressure-no definite evidence of non-STEMI that I can tell 2.  Diabetes with complications 3.  Anemia 4.  Hypotension resolved  Recommendations:  My recommendations would be to go ahead and get the nuclear stress test as Dr. Rennis GoldenHilty had ordered yesterday.  I do not feel strongly about starting heparin at this point.      Darden PalmerW. Spencer Trenee Igoe, Jr.  MD [facility]  12/14/2014, 9:54  AM

## 2014-12-14 NOTE — Progress Notes (Signed)
Nurse went in to check on patient and she was on bedside commode. Had been assisted up to bedside commode by another nurse. Patient needed assist back to bed. When bedside table was pushed out of the way to assist patient the trash can fell into floor and top of it braised patient's right shin causing a 1cm abraised area. Area cleaned and foam dressing applied. No s/s of discomfort or pain. [facility] Saivon Prowse [employer]

## 2014-12-15 ENCOUNTER — Inpatient Hospital Stay (HOSPITAL_COMMUNITY): Payer: Medicare Other

## 2014-12-15 DIAGNOSIS — R7989 Other specified abnormal findings of blood chemistry: Secondary | ICD-10-CM

## 2014-12-15 DIAGNOSIS — R079 Chest pain, unspecified: Secondary | ICD-10-CM

## 2014-12-15 LAB — GLUCOSE, CAPILLARY
GLUCOSE-CAPILLARY: 26 mg/dL — AB (ref 65–99)
GLUCOSE-CAPILLARY: 177 mg/dL — AB (ref 65–99)
GLUCOSE-CAPILLARY: 45 mg/dL — AB (ref 65–99)
Glucose-Capillary: 91 mg/dL (ref 65–99)
Glucose-Capillary: 51 mg/dL — ABNORMAL LOW (ref 65–99)
Glucose-Capillary: 253 mg/dL — ABNORMAL HIGH (ref 65–99)
Glucose-Capillary: 138 mg/dL — ABNORMAL HIGH (ref 65–99)
Glucose-Capillary: 109 mg/dL — ABNORMAL HIGH (ref 65–99)
Glucose-Capillary: 108 mg/dL — ABNORMAL HIGH (ref 65–99)

## 2014-12-15 LAB — BASIC METABOLIC PANEL
Anion gap: 7 (ref 5–15)
BUN: <5 mg/dL — ABNORMAL LOW (ref 6–20)
CO2: 25 mmol/L (ref 22–32)
CREATININE: 0.62 mg/dL (ref 0.44–1.00)
Calcium: 8.8 mg/dL — ABNORMAL LOW (ref 8.9–10.3)
Chloride: 105 mmol/L (ref 101–111)
GFR calc Af Amer: >60 mL/min (ref >60)
GLUCOSE: 121 mg/dL — AB (ref 65–99)
Potassium: 2.9 mmol/L — ABNORMAL LOW (ref 3.5–5.1)
SODIUM: 137 mmol/L (ref 135–145)

## 2014-12-15 LAB — CBC
HEMATOCRIT: 29.1 % — AB (ref 36.0–46.0)
HEMOGLOBIN: 9.9 g/dL — AB (ref 12.0–15.0)
MCH: 28.3 pg (ref 26.0–34.0)
MCHC: 34 g/dL (ref 30.0–36.0)
MCV: 83.1 fL (ref 78.0–100.0)
Platelets: 189 10*3/uL (ref 150–400)
RBC: 3.5 MIL/uL — ABNORMAL LOW (ref 3.87–5.11)
RDW: 14.6 % (ref 11.5–15.5)
WBC: 5.9 10*3/uL (ref 4.0–10.5)

## 2014-12-15 MED ORDER — DEXTROSE-NACL 5-0.45 % IV SOLN
INTRAVENOUS | Status: DC
  Administered 2014-12-15: 50 mL/h via INTRAVENOUS

## 2014-12-15 MED ORDER — REGADENOSON 0.4 MG/5ML IV SOLN
0.4000 mg | Freq: Once | INTRAVENOUS | Status: AC
  Administered 2014-12-15: 0.4 mg via INTRAVENOUS

## 2014-12-15 MED ORDER — TECHNETIUM TC 99M SESTAMIBI GENERIC - CARDIOLITE
10.0000 | Freq: Once | INTRAVENOUS | Status: AC | PRN
  Administered 2014-12-15: 10 via INTRAVENOUS

## 2014-12-15 MED ORDER — TECHNETIUM TC 99M SESTAMIBI - CARDIOLITE
30.0000 | Freq: Once | INTRAVENOUS | Status: AC | PRN
  Administered 2014-12-15: 30 via INTRAVENOUS

## 2014-12-15 MED ORDER — REGADENOSON 0.4 MG/5ML IV SOLN
INTRAVENOUS | Status: AC
  Administered 2014-12-15: 0.4 mg via INTRAVENOUS
  Filled 2014-12-15: qty 5

## 2014-12-15 MED ORDER — SODIUM CHLORIDE 0.9 % IJ SOLN
80.0000 mg | INTRAVENOUS | Status: AC
  Administered 2014-12-15: 80 mg via INTRAVENOUS

## 2014-12-15 MED ORDER — INSULIN ASPART 100 UNIT/ML ~~LOC~~ SOLN: 0.0000 [IU] | Freq: Three times a day (TID) | SUBCUTANEOUS | Status: DC

## 2014-12-15 MED ORDER — DEXTROSE 50 % IV SOLN
INTRAVENOUS | Status: AC
  Filled 2014-12-15: qty 50

## 2014-12-15 MED ORDER — "INSULIN SYRINGE-NEEDLE U-100 31G X 3/8"" 0.5 ML MISC": 1.0000 | Freq: Three times a day (TID) | Status: DC

## 2014-12-15 MED ORDER — POTASSIUM CHLORIDE CRYS ER 20 MEQ PO TBCR
40.0000 meq | EXTENDED_RELEASE_TABLET | Freq: Once | ORAL | Status: AC
  Administered 2014-12-15: 40 meq via ORAL
  Filled 2014-12-15: qty 2

## 2014-12-15 MED ORDER — INSULIN GLARGINE 100 UNIT/ML ~~LOC~~ SOLN: 8.0000 [IU] | Freq: Every day | SUBCUTANEOUS | Status: DC

## 2014-12-15 MED ORDER — INSULIN GLARGINE 100 UNIT/ML ~~LOC~~ SOLN
8.0000 [IU] | Freq: Every day | SUBCUTANEOUS | Status: DC
  Filled 2014-12-15 (×2): qty 0.08

## 2014-12-15 MED ORDER — POTASSIUM CHLORIDE 10 MEQ/100ML IV SOLN
10.0000 meq | INTRAVENOUS | Status: AC
  Administered 2014-12-15 (×3): 10 meq via INTRAVENOUS
  Filled 2014-12-15 (×3): qty 100

## 2014-12-15 MED ORDER — "INSULIN SYRINGE-NEEDLE U-100 31G X 3/8"" 0.5 ML MISC"
Freq: Three times a day (TID) | Status: DC
  Filled 2014-12-15 (×7): qty 1

## 2014-12-15 MED ORDER — DEXTROSE 50 % IV SOLN
50.0000 mL | Freq: Once | INTRAVENOUS | Status: AC
  Administered 2014-12-15: 50 mL via INTRAVENOUS

## 2014-12-15 NOTE — Discharge Instructions (Signed)
Follow with Primary MD Julian HySOUTH,STEPHEN ALAN, MD in 1 days   Get CBC, CMP, 2 view Chest X ray checked  by Primary MD next visit.    Activity: As tolerated with Full fall precautions use walker/cane & assistance as needed   Disposition Home    Diet: Heart Healthy , Carbohydrate modified , with feeding assistance and aspiration precautions.  For Heart failure patients - Check your Weight same time everyday, if you gain over 2 pounds, or you develop in leg swelling, experience more shortness of breath or chest pain, call your Primary MD immediately. Follow Cardiac Low Salt Diet and 1.5 lit/day fluid restriction.   On your next visit with your primary care physician please Get Medicines reviewed and adjusted.   Please request your Prim.MD to go over all Hospital Tests and Procedure/Radiological results at the follow up, please get all Hospital records sent to your Prim MD by signing hospital release before you go home.   If you experience worsening of your admission symptoms, develop shortness of breath, life threatening emergency, suicidal or homicidal thoughts you must seek medical attention immediately by calling 911 or calling your MD immediately  if symptoms less severe.  You Must read complete instructions/literature along with all the possible adverse reactions/side effects for all the Medicines you take and that have been prescribed to you. Take any new Medicines after you have completely understood and accpet all the possible adverse reactions/side effects.   Do not drive, operating heavy machinery, perform activities at heights, swimming or participation in water activities or provide baby sitting services if your were admitted for syncope or siezures until you have seen by Primary MD or a Neurologist and advised to do so again.  Do not drive when taking Pain medications.    Do not take more than prescribed Pain, Sleep and Anxiety Medications  Special Instructions: If you have  smoked or chewed Tobacco  in the last 2 yrs please stop smoking, stop any regular Alcohol  and or any Recreational drug use.  Wear Seat belts while driving.   Please note  You were cared for by a hospitalist during your hospital stay. If you have any questions about your discharge medications or the care you received while you were in the hospital after you are discharged, you can call the unit and asked to speak with the hospitalist on call if the hospitalist that took care of you is not available. Once you are discharged, your primary care physician will handle any further medical issues. Please note that NO REFILLS for any discharge medications will be authorized once you are discharged, as it is imperative that you return to your primary care physician (or establish a relationship with a primary care physician if you do not have one) for your aftercare needs so that they can reassess your need for medications and monitor your lab values.

## 2014-12-15 NOTE — Care Management Note (Signed)
Case Management Note  Patient Details  Name: Beth Payne MRN: [id number] Date of Birth: 11/23/1940  Subjective/Objective:                  DKA  Action/Plan: CM [facility]. Upon speaking to pt, she states that she was just seen by PT who is recommending SNF placement. CM explained SNF to patient and pt verbalized understanding and states that she feels that is what will be best for herself to get stronger. Pt denies any other CM needs and Cm explained that SW would be consulted and would speak to her regarding SNF placement. Please refer back to CM for any further discharge planning needs.   Expected Discharge Date:                  Expected Discharge Plan:  Skilled Nursing Facility  In-House Referral:  Clinical Social Work  Discharge planning Services  CM Consult  Post Acute Care Choice:    Choice offered to:     DME Arranged:    DME Agency:     HH Arranged:    HH Agency:     Status of Service:  In process, will continue to follow  Medicare Important Message Given:    Date Medicare IM Given:    Medicare IM give by:    Date Additional Medicare IM Given:  12/15/14 Additional Medicare Important Message give by:  Roxanne GatesAnna Jury Caserta, RN, BSN, CM  If discussed at [employer] Meetings, dates discussed:    Additional Comments:  Darcel SmallingAnna C Poppi Scantling, RN 12/15/2014, 5:17 PM

## 2014-12-15 NOTE — Progress Notes (Signed)
Spoke with Dr. Randol KernElgergawy concerning patient's insulin pump reinitiating.  Expressed concerns of staff noting some forgetfulness in patient over the last few days.  Asked if we could have the diabetes coordinator assess patient's appropriateness for insulin pump in the am.  Spoke with patient, she states she has managed her pump for the last 50 years and feels fine to do so.  Patient states her friend is available to bring pump and supplies today.  Will notify MD when pump and supplies arrive.  Primary nurse Bosie ClosJudith updated.  Colman Caterarpley, Meleana Commerford Danielle

## 2014-12-15 NOTE — Progress Notes (Signed)
CBG 45 at bedtime. Orange juice was given and her CBG came up to 108. Patient asymptomatic. Will continue to monitor.

## 2014-12-15 NOTE — Progress Notes (Signed)
Lexiscan MV performed. 1 day study, Radiology to read.

## 2014-12-15 NOTE — Progress Notes (Signed)
Patient's K+ level 2.9. Beth Payne paged . Beth Payne

## 2014-12-15 NOTE — Progress Notes (Signed)
Patient Name: Beth Payne Date of Encounter: 12/15/2014  Principal Problem:   DKA (diabetic ketoacidoses) Active Problems:   Acute renal failure   Dehydration   GERD (gastroesophageal reflux disease)   CAD s/p stent to mid-RCA in 2001.   Systolic murmur   Normocytic anemia   Dyslipidemia   NSTEMI (non-ST elevated myocardial infarction)   CAD in native artery   Pulmonary hypertension   Symptomatic bradycardia   Acute renal failure syndrome   Primary Cardiologist: Dr Donnie Ahoilley  Patient Profile: 74 yo female w/ hx type 1 DM on insulin pump, retinal hemorrhage, GERD, CAD s/p stent to RCA in 2001, OA, CVA, and depression admitted 07/07 w/ malignant hyperglycemia associated with nausea/vomiting and fatigue. Cards saw for bradycardia and hypotension. ECG with some changes, MV 07/10.  SUBJECTIVE: No chest pain, no SOB.  OBJECTIVE Filed Vitals:   12/14/14 1628 12/14/14 2000 12/14/14 2056 12/15/14 0526  BP: 161/58  160/61 151/68  Pulse: 77  71 76  Temp: 98.3 F (36.8 C)  99.1 F (37.3 C) 98.6 F (37 C)  TempSrc: Oral  Oral Oral  Resp: 16 18 15 16   Height:      Weight:    120 lb 8 oz (54.658 kg)  SpO2: 97%  98% 97%    Intake/Output Summary (Last 24 hours) at 12/15/14 1021 Last data filed at 12/15/14 0844  Gross per 24 hour  Intake 2688.75 ml  Output   1650 ml  Net 1038.75 ml   Filed Weights   12/14/14 0038 12/14/14 0400 12/15/14 0526  Weight: 121 lb 11.2 oz (55.203 kg) 119 lb 11.2 oz (54.296 kg) 120 lb 8 oz (54.658 kg)    PHYSICAL EXAM General: Well developed, well nourished, female in no acute distress. Head: Normocephalic, atraumatic.  Neck: Supple without bruits, JVD. Lungs:  Resp regular and unlabored, CTA. Heart: RRR, S1, S2, no S3, S4, or murmur; no rub. Abdomen: Soft, non-tender, non-distended, BS + x 4.  Extremities: No clubbing, cyanosis, edema.  Neuro: Alert and oriented X 3. Moves all extremities spontaneously. Psych: Normal  affect.  LABS: CBC: Recent Labs  12/15/14 0328  WBC 5.9  HGB 9.9*  HCT 29.1*  MCV 83.1  PLT 189   INR:No results for input(s): INR in the last 72 hours. Basic Metabolic Panel: Recent Labs  12/12/14 1320  12/13/14 0444 12/15/14 0328  NA 135  < > 137 137  K 5.0  < > 3.4* 2.9*  CL 103  < > 108 105  CO2 14*  < > 21* 25  GLUCOSE 560*  < > 113* 121*  BUN 27*  < > 14 <5*  CREATININE 1.72*  < > 0.96 0.62  CALCIUM 9.6  < > 9.0 8.8*  MG 1.9  --   --   --   PHOS 4.0  --   --   --   < > = values in this interval not displayed.  Cardiac Enzymes: Recent Labs  12/13/14 1309 12/13/14 1827 12/14/14 0007  TROPONINI 0.85* 0.79* 0.67*   Hemoglobin A1C: Recent Labs  12/12/14 1320  HGBA1C 6.6*   Thyroid Function Tests: Recent Labs  12/12/14 1235  TSH 1.192   Anemia Panel: Recent Labs  12/12/14 1235  VITAMINB12 661  FOLATE 32.5  FERRITIN 28  TIBC 291  IRON 144  RETICCTPCT 1.0    TELE: SR, seen in nuc med        Radiology/Studies: No results found.   Current Medications:  .  aspirin  325 mg Oral Daily  . enoxaparin (LOVENOX) injection  40 mg Subcutaneous Q24H  . insulin aspart  0-6 Units Subcutaneous TID WC  . insulin aspart  1-6 Units Subcutaneous TID WC  . insulin glargine  12 Units Subcutaneous QHS  . multivitamin with minerals  1 tablet Oral Daily  . pantoprazole  40 mg Oral Daily  . rosuvastatin  40 mg Oral q1800  . sertraline  150 mg Oral Daily  . sodium chloride  1,000 mL Intravenous Once   . sodium chloride 10 mL/hr at 12/13/14 1012  . dextrose 5 % and 0.45% NaCl 50 mL/hr (12/15/14 0855)    ASSESSMENT AND PLAN:   NSTEMI (non-ST elevated myocardial infarction) - if MV is negative, consider change to elevated troponin - EF normal and no WMA on echo - ECG not acute, no ongoing ischemic sx - OK for MV today, f/u on results.    CAD in native artery - if MV negative, f/u as OP - continue CRF reduction  Otherwise, per IM Principal Problem:    DKA (diabetic ketoacidoses) Active Problems:   Acute renal failure   Dehydration   GERD (gastroesophageal reflux disease)   CAD s/p stent to mid-RCA in 2001.   Systolic murmur   Normocytic anemia   Dyslipidemia   Pulmonary hypertension   Symptomatic bradycardia   Acute renal failure syndrome   Signed, Leanna Battles 10:21 AM 12/15/2014  Cardiology Attending  Patient seen and examined. Agree with above history, exam, assessment and plan. The patient has undergone stress testing which demonstrated no LV dysfunction and no evidence of ischemia. Low risk myoview. Ok for discharge home.  Leonia Reeves.D.

## 2014-12-15 NOTE — Progress Notes (Addendum)
Upon bedside report, patient asked if insulin pump supplies were delivered. Patient states she has supplies and has already placed pump on, unsure of time. When asked why she did not notify staff when supplies came, patient stated she was instructed by Dr. Brendia SacksElgergaw "to place pump on once pump and materials arrived". When asked wasn't she instructed to notify staff when supplies arrived  and that she was requested to sign a contract, patient stated " oh yeah she did tell me that". Patient received 6 units Novolog at 1731 per meal coverage orders. Patient states she hooked pump up and administered half unit of insulin to initaite pump. MD on call paged.

## 2014-12-15 NOTE — Discharge Summary (Addendum)
Beth Payne, is a 74 y.o. female  DOB 1941-01-25  MRN [id number].  Admission date:  12/12/2014  Admitting Physician  Leatha Gilding, MD  Discharge Date:  12/15/2014   Primary MD  [name], MD  Recommendations for primary care physician for things to follow:  - Please follow with Dr. Evlyn Kanner regarding further management of diabetes mellitus. Patient to resume insulin pump at home when supplies are available. - Check basic labs including CBC, BMP during next visit.  Admission Diagnosis  DKA (diabetic ketoacidoses) [E13.10] Diabetic ketoacidosis without coma associated with type 1 diabetes mellitus [E10.10]   Discharge Diagnosis  DKA (diabetic ketoacidoses) [E13.10] Diabetic ketoacidosis without coma associated with type 1 diabetes mellitus [E10.10]    Principal Problem:   DKA (diabetic ketoacidoses) Active Problems:   Acute renal failure   Dehydration   GERD (gastroesophageal reflux disease)   CAD s/p stent to mid-RCA in 2001.   Systolic murmur   Normocytic anemia   Dyslipidemia   NSTEMI (non-ST elevated myocardial infarction)   CAD in native artery   Pulmonary hypertension   Symptomatic bradycardia   Acute renal failure syndrome      Past Medical History  Diagnosis Date  . Diabetes mellitus   . Arthritis   . Coronary artery disease   . Cataract   . Acid reflux   . Depression   . Stroke   . GERD (gastroesophageal reflux disease)   . Retinopathy   . H/O cardiac catheterization     Past Surgical History  Procedure Laterality Date  . Abdominal hysterectomy    . Cataract surgery         History of present illness and  Hospital Course:     Kindly see H&P for history of present illness and admission details, please review complete Labs, Consult reports and Test reports for all details in brief  HPI  from the history and physical done on the day of admission  This  is a 74 year old female patient with past medical history of insulin-dependent diabetes mellitus on an insulin pump, GERD with reflux, history of CAD status post stent to RCA in 2012, osteoarthritis, history of stroke, and history of depression. She presents to the ER today with progressive hyperglycemia and associated nausea/vomiting and fatigue. Patient reports yesterday morning that she noticed initial CBG was 311; repeated several hours later and it was 314 so she changed out her insulin pump set up. Unfortunately her CBG was up to 500s this morning and she developed symptoms of nausea, fatigue and emesis. Patient reports she has been on an insulin pump for at least 16 years and typically does not have problems with utilization of the pump. She reports that the insulin that she places in the pump is up-to-date and has not expired. She is not having any other symptoms such as chest pain, shortness of breath, dysuria or urinary frequency, no cough fevers or chills.  In the ER patient was afebrile, BP was 101/38 with subsequent dip in blood pressure to  83/33 and decrease in heart rate from 81 to 56 associated with that hypotensive episode. She was subsequently given a fluid challenge by the ER physician. She was maintaining saturations of 99-100% on room air. Blood glucose was 538 with a serum CO2 of 18 and an anion gap of 16 so patient was placed on an insulin infusion. Her lactic acid was 1.94. She also had acute kidney injury with a BUN of 21 and creatinine of 1.6 with baseline renal function of 12 and 0.8. CBC was normal except for hemoglobin of 10.4 with last known hemoglobin 12.9 in 2013.   Hospital Course   74 year old BF PMHx depression, Insulin-dependent Diabetes Mellitus on an insulin pump, GERD with reflux, CAD S/P stent to RCA in 2012, osteoarthritis, Stroke Presents with hyperglycemia, nausea and vomiting, workup significant for elevated troponins, and by cardiology, and is for cardiac stress  test in a.m., had brief episode of bradycardia secondary to vasovagal from nausea and vomiting.,  DKA/Diabetes  - most likely related to bent cannula at the infusion site , the pump checked out fine ,  - Initially on insulin drip, currently transitioned to Lantus, insulin sliding scale ,  - Patient's pump is extremely old may require new pump.Follow with his endocrinologist as an outpatient Dr. [name]  -Anion gap closed -7/7 Hemoglobin A1c= 6.6 - CBGs currently acceptable on Lantus and custom sliding scale .(Had an episode of hypoglycemia this a.m., as she was nothing by mouth for stress test, Lantus was lowered from 12 to 8 units subcutaneous at bedtime on discharge) -  can resume her insulin pump at home when supplies are available.  Symptomatic bradycardia -Most likely vasovagal from her vomiting and DKA , avoid AVN blocking agents.   Elevated troponins - Initially trending up , troponins peaked at 0.85 , currently trending down . - The patient has undergone stress testing which demonstrated no LV dysfunction and no evidence of ischemia. Low risk myoview. Ok by cardiology for discharge home.   CAD in native artery; s/p stent to RCA (2012) - on aspirin and statin, no beta blockers given her bradycardia .  Normocytic anemia - continue to monitor   Acute renal failure/Dehydration -Secondary to DKA and volume depletion. - resolved with IV fluid  Hyperlipidemia  -increase Crestor to 40 mg daily  Discharge Condition: stable   Follow UP  Follow-up Information    Follow up with [name], MD. Schedule an appointment as soon as possible for a visit in 1 day.   Specialty:  Endocrinology   Why:  Please follow with Dr. Evlyn Kanner tomorrow for further management for diabetes.   Contact information:   [address] [place] [address] [phone]         Discharge Instructions  and  Discharge Medications    Discharge Instructions    Discharge  instructions    Complete by:  As directed   Follow with Primary MD [name], MD in 1 days    Activity: As tolerated with Full fall precautions use walker/cane & assistance as needed   Disposition Home    Diet: Heart Healthy /carbohydrate modified , with feeding assistance and aspiration precautions.  For Heart failure patients - Check your Weight same time everyday, if you gain over 2 pounds, or you develop in leg swelling, experience more shortness of breath or chest pain, call your Primary MD immediately. Follow Cardiac Low Salt Diet and 1.5 lit/day fluid restriction.   On your next visit with your primary care  physician please Get Medicines reviewed and adjusted.   Please request your Prim.MD to go over all Hospital Tests and Procedure/Radiological results at the follow up, please get all Hospital records sent to your Prim MD by signing hospital release before you go home.   If you experience worsening of your admission symptoms, develop shortness of breath, life threatening emergency, suicidal or homicidal thoughts you must seek medical attention immediately by calling 911 or calling your MD immediately  if symptoms less severe.  You Must read complete instructions/literature along with all the possible adverse reactions/side effects for all the Medicines you take and that have been prescribed to you. Take any new Medicines after you have completely understood and accpet all the possible adverse reactions/side effects.   Do not drive, operating heavy machinery, perform activities at heights, swimming or participation in water activities or provide baby sitting services if your were admitted for syncope or siezures until you have seen by Primary MD or a Neurologist and advised to do so again.  Do not drive when taking Pain medications.    Do not take more than prescribed Pain, Sleep and Anxiety Medications  Special Instructions: If you have smoked or chewed Tobacco  in  the last 2 yrs please stop smoking, stop any regular Alcohol  and or any Recreational drug use.  Wear Seat belts while driving.   Please note  You were cared for by a hospitalist during your hospital stay. If you have any questions about your discharge medications or the care you received while you were in the hospital after you are discharged, you can call the unit and asked to speak with the hospitalist on call if the hospitalist that took care of you is not available. Once you are discharged, your primary care physician will handle any further medical issues. Please note that NO REFILLS for any discharge medications will be authorized once you are discharged, as it is imperative that you return to your primary care physician (or establish a relationship with a primary care physician if you do not have one) for your aftercare needs so that they can reassess your need for medications and monitor your lab values.     Increase activity slowly    Complete by:  As directed             Medication List    STOP taking these medications        insulin lispro 100 UNIT/ML injection  Commonly known as:  HUMALOG      TAKE these medications        ARIPiprazole 2 MG tablet  Commonly known as:  ABILIFY  Take 2 mg by mouth daily.     aspirin 325 MG EC tablet  Take 325 mg by mouth daily.     cholecalciferol 1000 UNITS tablet  Commonly known as:  VITAMIN D  Take 2,000 Units by mouth daily.     docusate sodium 50 MG capsule  Commonly known as:  COLACE  Take 100 mg by mouth 2 (two) times daily.     insulin aspart 100 UNIT/ML injection  Commonly known as:  novoLOG  Inject 0-6 Units into the skin 3 (three) times daily with meals. 121-150=0 units 151-200=1 unit 201-250=2 units  251-300=3 units 301-350=4 units 351-400=6 units >400 give 6 units and call MD     insulin glargine 100 UNIT/ML injection  Commonly known as:  LANTUS  Inject 0.08 mLs (8 Units total) into the skin at bedtime.  Insulin Syringe-Needle U-100 31G X 3/8" 0.5 ML Misc  Inject 1 Syringe into the skin 3 (three) times daily before meals.     multivitamin with minerals Tabs tablet  Take 1 tablet by mouth daily.     omeprazole 20 MG capsule  Commonly known as:  PRILOSEC  Take 40 mg by mouth daily.     rosuvastatin 5 MG tablet  Commonly known as:  CRESTOR  Take 5 mg by mouth daily.     sertraline 100 MG tablet  Commonly known as:  ZOLOFT  Take 150 mg by mouth daily.          Diet and Activity recommendation: See Discharge Instructions above   Consults obtained -  Cardiology.  Major procedures and Radiology Reports - PLEASE review detailed and final reports for all details, in brief -  Nuclear stress test  IMPRESSION: 1. No reversible ischemia. Probable apical wall thinning, with apical wall infarction considered less likely.  2. Normal left ventricular wall motion.  3. Left ventricular ejection fraction 74%  4. Low-risk stress test findings*.    Nm Myocar Multi W/spect W/wall Motion / Ef  12/15/2014   CLINICAL DATA:  Chest pain.  Coronary artery disease.  Diabetes.  EXAM: MYOCARDIAL IMAGING WITH SPECT (REST AND PHARMACOLOGIC-STRESS)  GATED LEFT VENTRICULAR WALL MOTION STUDY  LEFT VENTRICULAR EJECTION FRACTION  TECHNIQUE: Standard myocardial SPECT imaging was performed after resting intravenous injection of 10 mCi Tc-46m sestamibi. Subsequently, intravenous infusion of Lexiscan was performed under the supervision of the Cardiology staff. At peak effect of the drug, 30 mCi Tc-65m sestamibi was injected intravenously and standard myocardial SPECT imaging was performed. Quantitative gated imaging was also performed to evaluate left ventricular wall motion, and estimate left ventricular ejection fraction.  COMPARISON:  None.  FINDINGS: Perfusion: No reversible left ventricular myocardial perfusion defects seen. A moderate fixed defect is seen involving the apical wall on both the stress and  resting images. Given the absence of corresponding motion abnormality, this favors apical thinning over apical wall infarction.  Wall Motion: Normal left ventricular wall motion. No left ventricular dilation.  Left Ventricular Ejection Fraction: 74 %  End diastolic volume 61 ml  End systolic volume 16 ml  IMPRESSION: 1. No reversible ischemia. Probable apical wall thinning, with apical wall infarction considered less likely.  2. Normal left ventricular wall motion.  3. Left ventricular ejection fraction 74%  4. Low-risk stress test findings*.  *2012 Appropriate Use Criteria for Coronary Revascularization Focused Update: J Am Coll Cardiol. 2012;59(9):857-881. http://content.dementiazones.com.aspx?articleid=1201161   Electronically Signed   By: Myles Rosenthal M.D.   On: 12/15/2014 13:20   Dg Chest Port 1 View  12/12/2014   CLINICAL DATA:  DKA  EXAM: PORTABLE CHEST - 1 VIEW  COMPARISON:  09/08/2011  FINDINGS: Mild cardiac enlargement. Right Coronary artery stent. Mild vascular congestion without edema.  Mild bibasilar atelectasis/ infiltrate.  No effusion.  IMPRESSION: Mild vascular congestion without edema  Mild bibasilar atelectasis/infiltrate.   Electronically Signed   By: Marlan Palau M.D.   On: 12/12/2014 13:30    Micro Results    Recent Results (from the past 240 hour(s))  MRSA PCR Screening     Status: None   Collection Time: 12/12/14  5:57 PM  Result Value Ref Range Status   MRSA by PCR NEGATIVE NEGATIVE Final    Comment:        The GeneXpert MRSA Assay (FDA approved for NASAL specimens only), is one component of a comprehensive MRSA colonization  surveillance program. It is not intended to diagnose MRSA infection nor to guide or monitor treatment for MRSA infections.        Today   Subjective:   Beth Payne today has no headache,no chest abdominal pain,no new weakness tingling or numbness, feels  today.   Objective:   Blood pressure 143/65, pulse 81, temperature 98.6  F (37 C), temperature source Oral, resp. rate 16, height 5\' 2"  (1.575 m), weight 54.658 kg (120 lb 8 oz), SpO2 97 %.   Intake/Output Summary (Last 24 hours) at 12/15/14 1439 Last data filed at 12/15/14 1326  Gross per 24 hour  Intake 2548.75 ml  Output   2400 ml  Net 148.75 ml    Exam Awake Alert, Oriented x 3, No new F.N deficits, Normal affect [place].AT,PERRAL Supple Neck,No JVD, No cervical lymphadenopathy appriciated.  Symmetrical Chest wall movement, Good air movement bilaterally, CTAB RRR,No Gallops,Rubs or new Murmurs, No Parasternal Heave +ve B.Sounds, Abd Soft, Non tender, No organomegaly appriciated, No rebound -guarding or rigidity. No Cyanosis, Clubbing or edema, No new Rash or bruise  Data Review   CBC w Diff: Lab Results  Component Value Date   WBC 5.9 12/15/2014   HGB 9.9* 12/15/2014   HCT 29.1* 12/15/2014   PLT 189 12/15/2014   LYMPHOPCT 14 12/12/2014   MONOPCT 3 12/12/2014   EOSPCT 0 12/12/2014   BASOPCT 0 12/12/2014    CMP: Lab Results  Component Value Date   NA 137 12/15/2014   K 2.9* 12/15/2014   CL 105 12/15/2014   CO2 25 12/15/2014   BUN <5* 12/15/2014   CREATININE 0.62 12/15/2014   PROT 6.9 12/12/2014   ALBUMIN 4.2 12/12/2014   BILITOT 1.1 12/12/2014   ALKPHOS 60 12/12/2014   AST 27 12/12/2014   ALT 25 12/12/2014  .   Total Time in preparing paper work, data evaluation and todays exam - 35 minutes  Beth Payne M.D on 12/15/2014 at 2:39 PM  [facility]   Office  [phone]

## 2014-12-15 NOTE — Progress Notes (Addendum)
Hypoglycemic Event  CBG: 51  Treatment: D50 IV 50 mL  Symptoms: Pale and Hungry  Follow-up CBG: Time: 0840 CBG Result:177  Possible Reasons for Event: Inadequate meal intake  Comments/MD notified: 1 AMP D50 GIVEN SINCE PATIENT NPO FOR STRESS TEST.  DR. Lisette AbuELGERWAY PAGED AND NOTIFIED, NEW ORDERS RECEIVED.    Colman Caterarpley, Yanett Conkright Danielle  Remember to initiate Hypoglycemia Order Set & complete

## 2014-12-15 NOTE — Progress Notes (Signed)
Patient states her sugar feels like its low. No diaphoresis noted. CBG=26. Patient given juice and crackers. States she has not been eating enough lately and needs to eat more. CBG rechecked and resulted at 91. Patient resting comfortably. [facility] Timaya Bojarski [employer]

## 2014-12-15 NOTE — Progress Notes (Signed)
Patient was seen by PT, unsafe for discharge home, so will hold on D/C today, Sw will arrange for SNF. Dakari Stabler MD

## 2014-12-15 NOTE — Progress Notes (Signed)
Due to the events with Pts home insulin pump and she not informing staff that she had pump and had placed it and given herself medication and then receiving novolog at supper per RN on call NP notified. Merdis DelayK. Schorr, NP ordered for insulin pump to be d/ced tonight and Pt and primary MD could decide if appropriate for pt to use in the hospital. Pt informed and removed pump. Will cover Pt with hospital ordered insulin. Cont to monitor.

## 2014-12-15 NOTE — Evaluation (Signed)
Physical Therapy Evaluation Patient Details Name: Beth Payne MRN: [id number] DOB: [birth year] Today's Date: 12/15/2014   History of Present Illness  This is a 74 year old female patient with past medical history of insulin-dependent diabetes mellitus on an insulin pump, GERD with reflux, history of CAD status post stent to RCA in 2012, osteoarthritis, history of stroke, and history of depression. She presented to the ER 12-12-14 with progressive hyperglycemia and associated nausea/vomiting and fatigue.  She was admitted with dx of DKA and NSTEMI.  Clinical Impression  Pt admitted with above diagnosis. Pt currently with functional limitations due to the deficits listed below (see PT Problem List). Significant weakness noted on eval, allowing for transfers only.  Pt will benefit from skilled PT to increase their independence and safety with mobility to allow discharge to the venue listed below.      Follow Up Recommendations SNF;Supervision/Assistance - 24 hour    Equipment Recommendations  None recommended by PT    Recommendations for Other Services       Precautions / Restrictions Precautions Precautions: Fall      Mobility  Bed Mobility Overal bed mobility: Needs Assistance Bed Mobility: Supine to Sit;Sit to Supine     Supine to sit: Min guard;HOB elevated Sit to supine: Min assist   General bed mobility comments: assist to position in bed after return to supine due to fatigue  Transfers Overall transfer level: Needs assistance Equipment used: Rolling walker (2 wheeled) Transfers: Sit to/from Stand Sit to Stand: Min guard         General transfer comment: verbal cues for hand placement and safety; Trembling noted with static stance at bedside, increasing in severity with increased standing time. Trembling thought to be due to weakness/muscle fatigue.  Ambulation/Gait             General Gait Details: Pt unable to ambulate due to fatigue/weakness.  Stairs             Wheelchair Mobility    Modified Rankin (Stroke Patients Only)       Balance                                             Pertinent Vitals/Pain Pain Assessment: No/denies pain    Home Living Family/patient expects to be discharged to:: Private residence Living Arrangements: Alone Available Help at Discharge: Family;Available PRN/intermittently Type of Home: House Home Access: Stairs to enter Entrance Stairs-Rails: Left;Right;Can reach both Secretary/administrator of Steps: 6 Home Layout: One level Home Equipment: Shower seat;Bedside commode;Walker - 2 wheels      Prior Function Level of Independence: Independent with assistive device(s)               Hand Dominance        Extremity/Trunk Assessment   Upper Extremity Assessment: Generalized weakness           Lower Extremity Assessment: Generalized weakness      Cervical / Trunk Assessment: Normal  Communication   Communication: No difficulties  Cognition Arousal/Alertness: Awake/alert Behavior During Therapy: WFL for tasks assessed/performed Overall Cognitive Status: Within Functional Limits for tasks assessed                      General Comments      Exercises        Assessment/Plan    PT Assessment Patient needs continued  PT services  PT Diagnosis Difficulty walking;Generalized weakness   PT Problem List Decreased strength;Decreased activity tolerance;Decreased balance;Decreased mobility;Decreased safety awareness  PT Treatment Interventions Gait training;Stair training;Functional mobility training;Therapeutic activities;Therapeutic exercise;Patient/family education;Balance training   PT Goals (Current goals can be found in the Care Plan section) Acute Rehab PT Goals Patient Stated Goal: home PT Goal Formulation: With patient Time For Goal Achievement: 12/29/14 Potential to Achieve Goals: Good    Frequency Min 3X/week   Barriers to discharge  Decreased caregiver support      Co-evaluation               End of Session Equipment Utilized During Treatment: Gait belt Activity Tolerance: Patient limited by fatigue Patient left: in bed;with family/visitor present;with call bell/phone within reach Nurse Communication: Mobility status         Time: [id number] PT Time Calculation (min) (ACUTE ONLY): 23 min   Charges:   PT Evaluation $Initial PT Evaluation Tier I: 1 Procedure PT Treatments $Therapeutic Activity: 8-22 mins   PT G Codes:        Ilda FoilGarrow, Brittnie Lewey Rene 12/15/2014, 4:06 PM

## 2014-12-16 DIAGNOSIS — I25111 Atherosclerotic heart disease of native coronary artery with angina pectoris with documented spasm: Secondary | ICD-10-CM | POA: Diagnosis not present

## 2014-12-16 DIAGNOSIS — Z4789 Encounter for other orthopedic aftercare: Secondary | ICD-10-CM | POA: Diagnosis not present

## 2014-12-16 DIAGNOSIS — S90512A Abrasion, left ankle, initial encounter: Secondary | ICD-10-CM | POA: Diagnosis present

## 2014-12-16 DIAGNOSIS — M25572 Pain in left ankle and joints of left foot: Secondary | ICD-10-CM | POA: Diagnosis not present

## 2014-12-16 DIAGNOSIS — S82292A Other fracture of shaft of left tibia, initial encounter for closed fracture: Secondary | ICD-10-CM | POA: Diagnosis not present

## 2014-12-16 DIAGNOSIS — F339 Major depressive disorder, recurrent, unspecified: Secondary | ICD-10-CM | POA: Diagnosis not present

## 2014-12-16 DIAGNOSIS — D62 Acute posthemorrhagic anemia: Secondary | ICD-10-CM | POA: Diagnosis not present

## 2014-12-16 DIAGNOSIS — M129 Arthropathy, unspecified: Secondary | ICD-10-CM | POA: Diagnosis not present

## 2014-12-16 DIAGNOSIS — Y92009 Unspecified place in unspecified non-institutional (private) residence as the place of occurrence of the external cause: Secondary | ICD-10-CM | POA: Diagnosis not present

## 2014-12-16 DIAGNOSIS — E785 Hyperlipidemia, unspecified: Secondary | ICD-10-CM | POA: Diagnosis not present

## 2014-12-16 DIAGNOSIS — M150 Primary generalized (osteo)arthritis: Secondary | ICD-10-CM | POA: Diagnosis not present

## 2014-12-16 DIAGNOSIS — Z955 Presence of coronary angioplasty implant and graft: Secondary | ICD-10-CM | POA: Diagnosis not present

## 2014-12-16 DIAGNOSIS — Z8673 Personal history of transient ischemic attack (TIA), and cerebral infarction without residual deficits: Secondary | ICD-10-CM | POA: Diagnosis not present

## 2014-12-16 DIAGNOSIS — E1065 Type 1 diabetes mellitus with hyperglycemia: Secondary | ICD-10-CM | POA: Diagnosis not present

## 2014-12-16 DIAGNOSIS — E0865 Diabetes mellitus due to underlying condition with hyperglycemia: Secondary | ICD-10-CM | POA: Diagnosis not present

## 2014-12-16 DIAGNOSIS — R001 Bradycardia, unspecified: Secondary | ICD-10-CM | POA: Diagnosis not present

## 2014-12-16 DIAGNOSIS — E088 Diabetes mellitus due to underlying condition with unspecified complications: Secondary | ICD-10-CM | POA: Diagnosis not present

## 2014-12-16 DIAGNOSIS — Z885 Allergy status to narcotic agent status: Secondary | ICD-10-CM | POA: Diagnosis not present

## 2014-12-16 DIAGNOSIS — E860 Dehydration: Secondary | ICD-10-CM | POA: Diagnosis not present

## 2014-12-16 DIAGNOSIS — Z823 Family history of stroke: Secondary | ICD-10-CM | POA: Diagnosis not present

## 2014-12-16 DIAGNOSIS — S82852A Displaced trimalleolar fracture of left lower leg, initial encounter for closed fracture: Secondary | ICD-10-CM | POA: Diagnosis not present

## 2014-12-16 DIAGNOSIS — Z7982 Long term (current) use of aspirin: Secondary | ICD-10-CM | POA: Diagnosis not present

## 2014-12-16 DIAGNOSIS — T148 Other injury of unspecified body region: Secondary | ICD-10-CM | POA: Diagnosis not present

## 2014-12-16 DIAGNOSIS — M1990 Unspecified osteoarthritis, unspecified site: Secondary | ICD-10-CM | POA: Diagnosis present

## 2014-12-16 DIAGNOSIS — I270 Primary pulmonary hypertension: Secondary | ICD-10-CM | POA: Diagnosis not present

## 2014-12-16 DIAGNOSIS — Z888 Allergy status to other drugs, medicaments and biological substances status: Secondary | ICD-10-CM | POA: Diagnosis not present

## 2014-12-16 DIAGNOSIS — Z6820 Body mass index (BMI) 20.0-20.9, adult: Secondary | ICD-10-CM | POA: Diagnosis not present

## 2014-12-16 DIAGNOSIS — S8252XA Displaced fracture of medial malleolus of left tibia, initial encounter for closed fracture: Secondary | ICD-10-CM | POA: Diagnosis not present

## 2014-12-16 DIAGNOSIS — Z794 Long term (current) use of insulin: Secondary | ICD-10-CM | POA: Diagnosis not present

## 2014-12-16 DIAGNOSIS — S82492K Other fracture of shaft of left fibula, subsequent encounter for closed fracture with nonunion: Secondary | ICD-10-CM | POA: Diagnosis not present

## 2014-12-16 DIAGNOSIS — E08351 Diabetes mellitus due to underlying condition with proliferative diabetic retinopathy with macular edema: Secondary | ICD-10-CM | POA: Diagnosis not present

## 2014-12-16 DIAGNOSIS — Z90710 Acquired absence of both cervix and uterus: Secondary | ICD-10-CM | POA: Diagnosis not present

## 2014-12-16 DIAGNOSIS — K219 Gastro-esophageal reflux disease without esophagitis: Secondary | ICD-10-CM | POA: Diagnosis not present

## 2014-12-16 DIAGNOSIS — M25579 Pain in unspecified ankle and joints of unspecified foot: Secondary | ICD-10-CM | POA: Diagnosis not present

## 2014-12-16 DIAGNOSIS — Z9641 Presence of insulin pump (external) (internal): Secondary | ICD-10-CM | POA: Diagnosis present

## 2014-12-16 DIAGNOSIS — F329 Major depressive disorder, single episode, unspecified: Secondary | ICD-10-CM | POA: Diagnosis present

## 2014-12-16 DIAGNOSIS — E108 Type 1 diabetes mellitus with unspecified complications: Secondary | ICD-10-CM | POA: Diagnosis not present

## 2014-12-16 DIAGNOSIS — S9302XA Subluxation of left ankle joint, initial encounter: Secondary | ICD-10-CM | POA: Diagnosis not present

## 2014-12-16 DIAGNOSIS — S82832A Other fracture of upper and lower end of left fibula, initial encounter for closed fracture: Secondary | ICD-10-CM | POA: Diagnosis not present

## 2014-12-16 DIAGNOSIS — I2510 Atherosclerotic heart disease of native coronary artery without angina pectoris: Secondary | ICD-10-CM | POA: Diagnosis not present

## 2014-12-16 DIAGNOSIS — S82892K Other fracture of left lower leg, subsequent encounter for closed fracture with nonunion: Secondary | ICD-10-CM | POA: Diagnosis not present

## 2014-12-16 DIAGNOSIS — I25119 Atherosclerotic heart disease of native coronary artery with unspecified angina pectoris: Secondary | ICD-10-CM | POA: Diagnosis not present

## 2014-12-16 DIAGNOSIS — E1310 Other specified diabetes mellitus with ketoacidosis without coma: Secondary | ICD-10-CM | POA: Diagnosis not present

## 2014-12-16 LAB — GLUCOSE, CAPILLARY
GLUCOSE-CAPILLARY: 268 mg/dL — AB (ref 65–99)
GLUCOSE-CAPILLARY: 276 mg/dL — AB (ref 65–99)
Glucose-Capillary: 302 mg/dL — ABNORMAL HIGH (ref 65–99)

## 2014-12-16 MED ORDER — INSULIN GLARGINE 100 UNIT/ML ~~LOC~~ SOLN
4.0000 [IU] | Freq: Every day | SUBCUTANEOUS | Status: DC
  Administered 2014-12-16: 4 [IU] via SUBCUTANEOUS
  Filled 2014-12-16: qty 0.04

## 2014-12-16 MED ORDER — INSULIN GLARGINE 100 UNIT/ML ~~LOC~~ SOLN: 4.0000 [IU] | Freq: Every day | SUBCUTANEOUS | Status: DC

## 2014-12-16 NOTE — Progress Notes (Signed)
Patient has had pump for 14 years, however, patient has a female caregiver at home that helps with calculations of boluses and fills her insulin cartridges. I do not think it will be appropriate Patient is ok with basal bolus therapy when d/c'd to SNF. Noted patient had hypoglycemia on 12 units of basal even though basal rate on pump is 11.9 units. Glucose today 302/268. I recommend patient to be on 6 units of Lantus (0.1 units/kg).   1225 pm: Spoke to Dr. Randol KernElgergawy, He will order 4 units of Lantus Daily.  Thanks,  Christena DeemShannon Kellan Raffield RN, MSN, [facility] Inpatient Diabetes Coordinator Team Pager [phone]

## 2014-12-16 NOTE — Care Management (Signed)
Important Message  Patient Details  Name: Beth Payne MRN: [id number] Date of Birth: 09/18/1940   Medicare Important Message Given:  Yes-third notification given    Kyla BalzarineShealy, Jatniel Verastegui Abena 12/16/2014, 3:01 PM

## 2014-12-16 NOTE — Progress Notes (Signed)
Physical Therapy Treatment Patient Details Name: Beth Payne MRN: [id number] DOB: 03/18/1941 Today's Date: 12/16/2014    History of Present Illness This is a 74 year old female patient with past medical history of insulin-dependent diabetes mellitus on an insulin pump, GERD with reflux, history of CAD status post stent to RCA in 2012, osteoarthritis, history of stroke, and history of depression. She presented to the ER 12-12-14 with progressive hyperglycemia and associated nausea/vomiting and fatigue.  She was admitted with dx of DKA and NSTEMI.    PT Comments    Pt admitted with above diagnosis. Pt currently with functional limitations due to balance and endurance deficits.  Pt able to ambulate with RW with unsteady gait at times but is progressing.  Still needs NHP.   Pt will benefit from skilled PT to increase their independence and safety with mobility to allow discharge to the venue listed below.    Follow Up Recommendations  SNF;Supervision/Assistance - 24 hour     Equipment Recommendations  None recommended by PT    Recommendations for Other Services       Precautions / Restrictions Precautions Precautions: Fall Restrictions Weight Bearing Restrictions: No    Mobility  Bed Mobility Overal bed mobility: Needs Assistance Bed Mobility: Supine to Sit     Supine to sit: Min guard     General bed mobility comments: Took incr time to get to EOB  Transfers Overall transfer level: Needs assistance Equipment used: Rolling walker (2 wheeled) Transfers: Sit to/from Stand Sit to Stand: Min assist         General transfer comment: verbal cues for hand placement and safety; Trembling noted with static stance at bedside, increasing in severity with increased standing time. Trembling thought to be due to weakness/muscle fatigue.  Ambulation/Gait Ambulation/Gait assistance: Min guard;Min assist Ambulation Distance (Feet): 18 Feet Assistive device: Rolling walker (2  wheeled) Gait Pattern/deviations: Decreased stride length;Step-to pattern   Gait velocity interpretation: Below normal speed for age/gender General Gait Details: Pt ambulated to the door and back to recliner with min assist at times due to unsteady gait with posterior lean at times.  Needed cues to stay close to RW as well and asssit to steer RW.    Stairs            Wheelchair Mobility    Modified Rankin (Stroke Patients Only)       Balance Overall balance assessment: Needs assistance;History of Falls         Standing balance support: Bilateral upper extremity supported;During functional activity Standing balance-Leahy Scale: Poor Standing balance comment: Requires UE suppport for balance.                      Cognition Arousal/Alertness: Awake/alert Behavior During Therapy: WFL for tasks assessed/performed Overall Cognitive Status: Within Functional Limits for tasks assessed                      Exercises General Exercises - Lower Extremity Ankle Circles/Pumps: AROM;Both;10 reps;Seated Long Arc Quad: AROM;Both;10 reps;Seated Hip Flexion/Marching: AROM;Both;10 reps;Seated    General Comments        Pertinent Vitals/Pain Pain Assessment: No/denies pain  VSS    Home Living                      Prior Function            PT Goals (current goals can now be found in the care plan section) Progress towards  PT goals: Progressing toward goals    Frequency  Min 3X/week    PT Plan Current plan remains appropriate    Co-evaluation             End of Session Equipment Utilized During Treatment: Gait belt Activity Tolerance: Patient limited by fatigue Patient left: in chair;with call bell/phone within reach     Time: 1038-1050 PT Time Calculation (min) (ACUTE ONLY): 12 min  Charges:  $Gait Training: 8-22 mins                    G Codes:      [name] 01/15/15, 1:33 PM [employer] Acute  Rehabilitation [phone] (pager)

## 2014-12-16 NOTE — Discharge Summary (Signed)
Beth Payne, is a 74 y.o. female  DOB Jun 09, 1940  MRN [id number].  Admission date:  12/12/2014  Admitting Physician  Leatha Gilding, MD  Discharge Date:  12/16/2014   Primary MD  [name], MD  This discharge summary has been modified , as patient could not be discharged home, he will be discharged to SNF. Recommendations for primary care physician for things to follow:  - Please follow with Dr. Evlyn Kanner regarding further management of diabetes mellitus. Shin to be discharged on Lantus and NovoLog custom sliding scale - Check basic labs including CBC, BMP during next visit.  Admission Diagnosis  DKA (diabetic ketoacidoses) [E13.10] Diabetic ketoacidosis without coma associated with type 1 diabetes mellitus [E10.10]   Discharge Diagnosis  DKA (diabetic ketoacidoses) [E13.10] Diabetic ketoacidosis without coma associated with type 1 diabetes mellitus [E10.10]    Principal Problem:   DKA (diabetic ketoacidoses) Active Problems:   Acute renal failure   Dehydration   GERD (gastroesophageal reflux disease)   CAD s/p stent to mid-RCA in 2001.   Systolic murmur   Normocytic anemia   Dyslipidemia   NSTEMI (non-ST elevated myocardial infarction)   CAD in native artery   Pulmonary hypertension   Symptomatic bradycardia   Acute renal failure syndrome      Past Medical History  Diagnosis Date  . Diabetes mellitus   . Arthritis   . Coronary artery disease   . Cataract   . Acid reflux   . Depression   . Stroke   . GERD (gastroesophageal reflux disease)   . Retinopathy   . H/O cardiac catheterization     Past Surgical History  Procedure Laterality Date  . Abdominal hysterectomy    . Cataract surgery         History of present illness and  Hospital Course:     Kindly see H&P for history of present illness and admission details, please review complete Labs, Consult reports and  Test reports for all details in brief  HPI  from the history and physical done on the day of admission  This is a 74 year old female patient with past medical history of insulin-dependent diabetes mellitus on an insulin pump, GERD with reflux, history of CAD status post stent to RCA in 2012, osteoarthritis, history of stroke, and history of depression. She presents to the ER today with progressive hyperglycemia and associated nausea/vomiting and fatigue. Patient reports yesterday morning that she noticed initial CBG was 311; repeated several hours later and it was 314 so she changed out her insulin pump set up. Unfortunately her CBG was up to 500s this morning and she developed symptoms of nausea, fatigue and emesis. Patient reports she has been on an insulin pump for at least 16 years and typically does not have problems with utilization of the pump. She reports that the insulin that she places in the pump is up-to-date and has not expired. She is not having any other symptoms such as chest pain, shortness of breath, dysuria or urinary frequency, no cough  fevers or chills.  In the ER patient was afebrile, BP was 101/38 with subsequent dip in blood pressure to 83/33 and decrease in heart rate from 81 to 56 associated with that hypotensive episode. She was subsequently given a fluid challenge by the ER physician. She was maintaining saturations of 99-100% on room air. Blood glucose was 538 with a serum CO2 of 18 and an anion gap of 16 so patient was placed on an insulin infusion. Her lactic acid was 1.94. She also had acute kidney injury with a BUN of 21 and creatinine of 1.6 with baseline renal function of 12 and 0.8. CBC was normal except for hemoglobin of 10.4 with last known hemoglobin 12.9 in 2013.   Hospital Course   74 year old BF PMHx depression, Insulin-dependent Diabetes Mellitus on an insulin pump, GERD with reflux, CAD S/P stent to RCA in 2012, osteoarthritis, Stroke Presents with  hyperglycemia, nausea and vomiting, workup significant for elevated troponins, and by cardiology, and is for cardiac stress test in a.m., had brief episode of bradycardia secondary to vasovagal from nausea and vomiting.,  DKA/Diabetes  - most likely related to bent cannula at the infusion site , the pump checked out fine ,  - Initially on insulin drip, currently transitioned to Lantus, insulin sliding scale ,  - Patient's pump is extremely old may require new pump.Follow with his endocrinologist as an outpatient Dr. [name]  -Anion gap closed -7/7 Hemoglobin A1c= 6.6 - Patient had 2 episodes of hypoglycemia on Lantus and insulin sliding scale, initial episode was on Lantus 12 units the night before, the other episodes of hypoglycemia happened during this other night where patient did not receive Lantus, so patient will be discharged on Lantus 4 units subcutaneous daily, and custom insulin sliding-scale, agent will not be resumed on an insulin pump as she is being discharged to SNF, and patient caretaker was the one who is managing her insulin pump.  Symptomatic bradycardia -Most likely vasovagal from her vomiting and DKA , avoid AVN blocking agents.   Elevated troponins - Initially trending up , troponins peaked at 0.85 , currently trending down . - The patient has undergone stress testing which demonstrated no LV dysfunction and no evidence of ischemia. Low risk myoview. Ok by cardiology for discharge home.   CAD in native artery; s/p stent to RCA (2012) - on aspirin and statin, no beta blockers given her bradycardia .  Normocytic anemia - continue to monitor   Acute renal failure/Dehydration -Secondary to DKA and volume depletion. - resolved with IV fluid  Hyperlipidemia  -increase Crestor to 40 mg daily  Discharge Condition: stable   Follow UP  Follow-up Information    Follow up with [name], MD. Schedule an appointment as soon as possible for a  visit in 1 day.   Specialty:  Endocrinology   Why:  Please follow with Dr. Evlyn Kanner tomorrow for further management for diabetes.   Contact information:   [address] [place] [address] [phone]         Discharge Instructions  and  Discharge Medications        Discharge Instructions    Discharge instructions    Complete by:  As directed   Follow with Primary MD [name], MD in 1 days    Activity: As tolerated with Full fall precautions use walker/cane & assistance as needed   Disposition Home    Diet: Heart Healthy /carbohydrate modified , with feeding assistance and aspiration precautions.  For Heart  failure patients - Check your Weight same time everyday, if you gain over 2 pounds, or you develop in leg swelling, experience more shortness of breath or chest pain, call your Primary MD immediately. Follow Cardiac Low Salt Diet and 1.5 lit/day fluid restriction.   On your next visit with your primary care physician please Get Medicines reviewed and adjusted.   Please request your Prim.MD to go over all Hospital Tests and Procedure/Radiological results at the follow up, please get all Hospital records sent to your Prim MD by signing hospital release before you go home.   If you experience worsening of your admission symptoms, develop shortness of breath, life threatening emergency, suicidal or homicidal thoughts you must seek medical attention immediately by calling 911 or calling your MD immediately  if symptoms less severe.  You Must read complete instructions/literature along with all the possible adverse reactions/side effects for all the Medicines you take and that have been prescribed to you. Take any new Medicines after you have completely understood and accpet all the possible adverse reactions/side effects.   Do not drive, operating heavy machinery, perform activities at heights, swimming or participation in water activities or provide baby sitting  services if your were admitted for syncope or siezures until you have seen by Primary MD or a Neurologist and advised to do so again.  Do not drive when taking Pain medications.    Do not take more than prescribed Pain, Sleep and Anxiety Medications  Special Instructions: If you have smoked or chewed Tobacco  in the last 2 yrs please stop smoking, stop any regular Alcohol  and or any Recreational drug use.  Wear Seat belts while driving.   Please note  You were cared for by a hospitalist during your hospital stay. If you have any questions about your discharge medications or the care you received while you were in the hospital after you are discharged, you can call the unit and asked to speak with the hospitalist on call if the hospitalist that took care of you is not available. Once you are discharged, your primary care physician will handle any further medical issues. Please note that NO REFILLS for any discharge medications will be authorized once you are discharged, as it is imperative that you return to your primary care physician (or establish a relationship with a primary care physician if you do not have one) for your aftercare needs so that they can reassess your need for medications and monitor your lab values.     Increase activity slowly    Complete by:  As directed             Medication List    STOP taking these medications        insulin lispro 100 UNIT/ML injection  Commonly known as:  HUMALOG      TAKE these medications        ARIPiprazole 2 MG tablet  Commonly known as:  ABILIFY  Take 2 mg by mouth daily.     aspirin 325 MG EC tablet  Take 325 mg by mouth daily.     cholecalciferol 1000 UNITS tablet  Commonly known as:  VITAMIN D  Take 2,000 Units by mouth daily.     docusate sodium 50 MG capsule  Commonly known as:  COLACE  Take 100 mg by mouth 2 (two) times daily.     insulin aspart 100 UNIT/ML injection  Commonly known as:  novoLOG  Inject 0-6  Units into the skin  3 (three) times daily with meals. 121-150=0 units 151-200=1 unit 201-250=2 units  251-300=3 units 301-350=4 units 351-400=6 units >400 give 6 units and call MD     insulin glargine 100 UNIT/ML injection  Commonly known as:  LANTUS  Inject 0.04 mLs (4 Units total) into the skin daily.     multivitamin with minerals Tabs tablet  Take 1 tablet by mouth daily.     omeprazole 20 MG capsule  Commonly known as:  PRILOSEC  Take 40 mg by mouth daily.     rosuvastatin 5 MG tablet  Commonly known as:  CRESTOR  Take 5 mg by mouth daily.     sertraline 100 MG tablet  Commonly known as:  ZOLOFT  Take 150 mg by mouth daily.          Diet and Activity recommendation: See Discharge Instructions above   Consults obtained -  Cardiology.  Major procedures and Radiology Reports - PLEASE review detailed and final reports for all details, in brief -  Nuclear stress test  IMPRESSION: 1. No reversible ischemia. Probable apical wall thinning, with apical wall infarction considered less likely.  2. Normal left ventricular wall motion.  3. Left ventricular ejection fraction 74%  4. Low-risk stress test findings*.    Nm Myocar Multi W/spect W/wall Motion / Ef  12/15/2014   CLINICAL DATA:  Chest pain.  Coronary artery disease.  Diabetes.  EXAM: MYOCARDIAL IMAGING WITH SPECT (REST AND PHARMACOLOGIC-STRESS)  GATED LEFT VENTRICULAR WALL MOTION STUDY  LEFT VENTRICULAR EJECTION FRACTION  TECHNIQUE: Standard myocardial SPECT imaging was performed after resting intravenous injection of 10 mCi Tc-4979m sestamibi. Subsequently, intravenous infusion of Lexiscan was performed under the supervision of the Cardiology staff. At peak effect of the drug, 30 mCi Tc-9579m sestamibi was injected intravenously and standard myocardial SPECT imaging was performed. Quantitative gated imaging was also performed to evaluate left ventricular wall motion, and estimate left ventricular ejection fraction.   COMPARISON:  None.  FINDINGS: Perfusion: No reversible left ventricular myocardial perfusion defects seen. A moderate fixed defect is seen involving the apical wall on both the stress and resting images. Given the absence of corresponding motion abnormality, this favors apical thinning over apical wall infarction.  Wall Motion: Normal left ventricular wall motion. No left ventricular dilation.  Left Ventricular Ejection Fraction: 74 %  End diastolic volume 61 ml  End systolic volume 16 ml  IMPRESSION: 1. No reversible ischemia. Probable apical wall thinning, with apical wall infarction considered less likely.  2. Normal left ventricular wall motion.  3. Left ventricular ejection fraction 74%  4. Low-risk stress test findings*.  *2012 Appropriate Use Criteria for Coronary Revascularization Focused Update: J Am Coll Cardiol. 2012;59(9):857-881. http://content.dementiazones.comonlinejacc.org/article.aspx?articleid=1201161   Electronically Signed   By: Myles RosenthalJohn  Stahl M.D.   On: 12/15/2014 13:20   Dg Chest Port 1 View  12/12/2014   CLINICAL DATA:  DKA  EXAM: PORTABLE CHEST - 1 VIEW  COMPARISON:  09/08/2011  FINDINGS: Mild cardiac enlargement. Right Coronary artery stent. Mild vascular congestion without edema.  Mild bibasilar atelectasis/ infiltrate.  No effusion.  IMPRESSION: Mild vascular congestion without edema  Mild bibasilar atelectasis/infiltrate.   Electronically Signed   By: Marlan Palauharles  Clark M.D.   On: 12/12/2014 13:30    Micro Results    Recent Results (from the past 240 hour(s))  MRSA PCR Screening     Status: None   Collection Time: 12/12/14  5:57 PM  Result Value Ref Range Status   MRSA by PCR NEGATIVE NEGATIVE  Final    Comment:        The GeneXpert MRSA Assay (FDA approved for NASAL specimens only), is one component of a comprehensive MRSA colonization surveillance program. It is not intended to diagnose MRSA infection nor to guide or monitor treatment for MRSA infections.        Today    Subjective:   Beth Payne today has no headache,no chest abdominal pain,no new weakness tingling or numbness, feels  better today.   Objective:   Blood pressure 143/61, pulse 81, temperature 98.9 F (37.2 C), temperature source Oral, resp. rate 18, height  (1.575 m), weight 51.937 kg (114 lb 8 oz), SpO2 98 %.   Intake/Output Summary (Last 24 hours) at 12/16/14 1558 Last data filed at 12/16/14 1150  Gross per 24 hour  Intake    360 ml  Output    550 ml  Net   -190 ml    Exam Awake Alert, Oriented x 3, No new F.N deficits, Normal affect Catherine.AT,PERRAL Supple Neck,No JVD, No cervical lymphadenopathy appriciated.  Symmetrical Chest wall movement, Good air movement bilaterally, CTAB RRR,No Gallops,Rubs or new Murmurs, No Parasternal Heave +ve B.Sounds, Abd Soft, Non tender, No organomegaly appriciated, No rebound -guarding or rigidity. No Cyanosis, Clubbing or edema, No new Rash or bruise  Data Review   CBC w Diff:  Lab Results  Component Value Date   WBC 5.9 12/15/2014   HGB 9.9* 12/15/2014   HCT 29.1* 12/15/2014   PLT 189 12/15/2014   LYMPHOPCT 14 12/12/2014   MONOPCT 3 12/12/2014   EOSPCT 0 12/12/2014   BASOPCT 0 12/12/2014    CMP:  Lab Results  Component Value Date   NA 137 12/15/2014   K 2.9* 12/15/2014   CL 105 12/15/2014   CO2 25 12/15/2014   BUN <5* 12/15/2014   CREATININE 0.62 12/15/2014   PROT 6.9 12/12/2014   ALBUMIN 4.2 12/12/2014   BILITOT 1.1 12/12/2014   ALKPHOS 60 12/12/2014   AST 27 12/12/2014   ALT 25 12/12/2014  .   Total Time in preparing paper work, data evaluation and todays exam - 35 minutes  Simpson Paulos M.D on 12/16/2014 at 3:58 PM  [facility]   Office  [phone]

## 2014-12-16 NOTE — Clinical Social Work Placement (Signed)
   CLINICAL SOCIAL WORK PLACEMENT  NOTE  Date:  12/16/2014  Patient Details  Name: Beth Payne MRN: [id number] Date of Birth: 01/29/1941  Clinical Social Work is seeking post-discharge placement for this patient at the Skilled  Nursing Facility level of care (*CSW will initial, date and re-position this form in  chart as items are completed):  Yes   Patient/family provided with [place] Clinical Social Work Department's list of facilities offering this level of care within the geographic area requested by the patient (or if unable, by the patient's family).  Yes   Patient/family informed of their freedom to choose among providers that offer the needed level of care, that participate in Medicare, Medicaid or managed care program needed by the patient, have an available bed and are willing to accept the patient.  Yes   Patient/family informed of [facility]'s ownership interest in [facility] and [facility], as well as of the fact that they are under no obligation to receive care at these facilities.  PASRR submitted to EDS on 12/16/14     PASRR number received on 12/16/14     Existing PASRR number confirmed on 12/16/14     FL2 transmitted to all facilities in geographic area requested by pt/family on       FL2 transmitted to all facilities within larger geographic area on       Patient informed that his/her managed care company has contracts with or will negotiate with certain facilities, including the following:        Yes   Patient/family informed of bed offers received.  [facility]     Physician recommends and patient chooses bed at      Patient to [facility] on 12/16/14.  Patient to be transferred to facility by CAR (family)     Patient family notified on 12/16/14 of transfer.  Name of family member notified:  Pt notified; declined for CSW to call caregiver     PHYSICIAN      Additional Comment:    _______________________________________________ Sharol HarnessPoonum Zoey Bidwell, Theresia MajorsLCSWA [phone]

## 2014-12-16 NOTE — Progress Notes (Signed)
CSW (Clinical Child psychotherapistocial Worker) prepared pt dc packet and placed with shadow chart. CSW arranged non-emergent ambulance transport. Pt, pt nurse, and facility informed. Pt declined for CSW to call family/friends. CSW signing off.  Denetta Fei, LCSWA [phone]

## 2014-12-16 NOTE — Clinical Social Work Note (Signed)
Clinical Social Work Assessment  Patient Details  Name: Beth Payne MRN: [id number] Date of Birth: 06/03/1941  Date of referral:  12/16/14               Reason for consult:  Facility Placement                Permission sought to share information with:  Facility Industrial/product designerContact Representative Permission granted to share information::  Yes, Verbal Permission Granted  Name::        Agency::  SNFs for referral purposes  Relationship::     Contact Information:     Housing/Transportation Living arrangements for the past 2 months:  Single Family Home Source of Information:  Patient Patient Interpreter Needed:  None Criminal Activity/Legal Involvement Pertinent to Current Situation/Hospitalization:  No - Comment as needed Significant Relationships:  Friend Lives with:  Self Do you feel safe going back to the place where you live?  Yes Need for family participation in patient care:  No (Coment)  Care giving concerns:  [facility]   Social Worker assessment / plan:  CSW visited with pt multiple times today to discuss discharge plan. Pt aware she is ready for dc today and upset that she will need to make decision quickly. Pt currently reviewed bed offers and will notify CSW as soon as she has made a decision.  Employment status:  Retired Health and safety inspectornsurance information:    PT Recommendations:  Skilled Teacher, early years/preursing Facility Information / Referral to community resources:  Skilled Nursing Facility  Patient/Family's Response to care:  Pt agreeable to SNF recommendation but feel she is not ready to leave the hospital  Patient/Family's Understanding of and Emotional Response to Diagnosis, Current Treatment, and Prognosis:  Pt with limited understanding of current diagnosis. Pt with appropriate emotional response.   Emotional Assessment Appearance:  Well-Groomed Attitude/Demeanor/Rapport:  Complaining (Pt feels she is being rushed out of the hospital) Affect (typically observed):   Pleasant Orientation:  Oriented to Self, Oriented to Place, Oriented to  Time, Oriented to Situation Alcohol / Substance use:  Not Applicable Psych involvement (Current and /or in the community):  No (Comment)  Discharge Needs  Concerns to be addressed:  Discharge Planning Concerns Readmission within the last 30 days:  No Current discharge risk:  Dependent with Mobility Barriers to Discharge:  No Barriers Identified   Beth Payne, LCSWA [phone]

## 2014-12-16 NOTE — Clinical Social Work Placement (Signed)
   CLINICAL SOCIAL WORK PLACEMENT  NOTE  Date:  12/16/2014  Patient Details  Name: Theron Aristahomasine C Kuna MRN: [id number] Date of Birth: 05/01/1941  Clinical Social Work is seeking post-discharge placement for this patient at the Skilled  Nursing Facility level of care (*CSW will initial, date and re-position this form in  chart as items are completed):  Yes   Patient/family provided with [place] Clinical Social Work Department's list of facilities offering this level of care within the geographic area requested by the patient (or if unable, by the patient's family).  Yes   Patient/family informed of their freedom to choose among providers that offer the needed level of care, that participate in Medicare, Medicaid or managed care program needed by the patient, have an available bed and are willing to accept the patient.  Yes   Patient/family informed of [place]'s ownership interest in [facility] and [facility], as well as of the fact that they are under no obligation to receive care at these facilities.  PASRR submitted to EDS on 12/16/14     PASRR number received on 12/16/14     Existing PASRR number confirmed on 12/16/14     FL2 transmitted to all facilities in geographic area requested by pt/family on       FL2 transmitted to all facilities within larger geographic area on       Patient informed that his/her managed care company has contracts with or will negotiate with certain facilities, including the following:        Yes   Patient/family informed of bed offers received.  Patient chooses bed at       Physician recommends and patient chooses bed at      Patient to be transferred to   on  .  Patient to be transferred to facility by       Patient family notified on   of transfer.  Name of family member notified:        PHYSICIAN Please sign DNR, Please prepare priority discharge summary, including medications, Please prepare prescriptions, Please sign FL2      Additional Comment:    _______________________________________________ Sharol HarnessPoonum Vertis Bauder, Theresia MajorsLCSWA [phone]

## 2014-12-16 NOTE — Progress Notes (Signed)
Report called to Hilaria Otaonna Williams, LPN [facility]. Transferred by private car.

## 2014-12-17 ENCOUNTER — Non-Acute Institutional Stay (SKILLED_NURSING_FACILITY): Payer: Medicare Other | Admitting: Internal Medicine

## 2014-12-17 DIAGNOSIS — E108 Type 1 diabetes mellitus with unspecified complications: Secondary | ICD-10-CM | POA: Diagnosis not present

## 2014-12-17 DIAGNOSIS — K219 Gastro-esophageal reflux disease without esophagitis: Secondary | ICD-10-CM

## 2014-12-17 DIAGNOSIS — M150 Primary generalized (osteo)arthritis: Secondary | ICD-10-CM

## 2014-12-17 DIAGNOSIS — E785 Hyperlipidemia, unspecified: Secondary | ICD-10-CM

## 2014-12-17 DIAGNOSIS — I270 Primary pulmonary hypertension: Secondary | ICD-10-CM

## 2014-12-17 DIAGNOSIS — I2510 Atherosclerotic heart disease of native coronary artery without angina pectoris: Secondary | ICD-10-CM | POA: Diagnosis not present

## 2014-12-17 DIAGNOSIS — I272 Pulmonary hypertension, unspecified: Secondary | ICD-10-CM

## 2014-12-17 DIAGNOSIS — M159 Polyosteoarthritis, unspecified: Secondary | ICD-10-CM

## 2014-12-17 DIAGNOSIS — E1051 Type 1 diabetes mellitus with diabetic peripheral angiopathy without gangrene: Secondary | ICD-10-CM | POA: Insufficient documentation

## 2014-12-17 NOTE — Progress Notes (Signed)
Patient ID: Beth Payne, female   DOB: 08/31/1940, 74 y.o.   MRN: [id number]    HISTORY AND PHYSICAL   DATE: 12/17/14  Location:  [facility]    Place of Service: SNF [phone])   Extended Emergency Contact Information Primary Emergency Contact: Bruner,[name] Address: [address]          [place] [id number] [place] Home Phone: [phone] Relation: Friend  Advanced Directive information   DNR  Chief Complaint  Patient presents with  . New Admit To SNF    HPI:  74 yo female seen today as a new admission into SNF following hospital stay for DKA previously on insulin pump, respiratory failure, GERD, CAD with hx MI s/p stent, PPH, hyperlipidemia and major depressive d/o. On admission, CBG >500, CO2 18 and AG 16. Beth Payne was started on an insulin gtt. A1c 6.6%. Beth Payne was d/c'd on lantus 8 units subcut qhs, SSi. Beth Payne had a nuclear stres tes that was neg for acute ischemia and Beth Payne had a nml EF. Beth Payne will need short term rehab  Beth Payne reports no N/V, polyuria or polyphagia. (+) polydipsia. No insulin pump. Appetite ok. Sleeping well. No nursing issues. Beth Payne is a poor historian due to psych issues. Hx obtained from chart. No falls  DM - controlled overall but has hyperglycemia. CBG 327 today. No low BS reactions. Beth Payne is on insulin  CAD - on ASA daily. No CP. Takes statin for cholesterol  MDD - on abilify and zoloft  GERD - stable on omeprazole  pulm HTN - stable. Currently not on medication  Arthritis - stable. Takes no pain meds at this time  Past Medical History  Diagnosis Date  . Diabetes mellitus   . Arthritis   . Coronary artery disease   . Cataract   . Acid reflux   . Depression   . Stroke   . GERD (gastroesophageal reflux disease)   . Retinopathy   . H/O cardiac catheterization     Past Surgical History  Procedure Laterality Date  . Abdominal hysterectomy    . Cataract surgery      Patient Care Team: Adrian PrinceStephen South, MD as PCP -  General (Endocrinology)  History   Social History  . Marital Status: Widowed    Spouse Name: N/A  . Number of Children: N/A  . Years of Education: N/A   Occupational History  . Not on file.   Social History Main Topics  . Smoking status: Never Smoker   . Smokeless tobacco: Not on file  . Alcohol Use: Yes  . Drug Use: Not on file  . Sexual Activity: Not on file   Other Topics Concern  . Not on file   Social History Narrative     reports that Beth Payne has never smoked. Beth Payne does not have any smokeless tobacco history on file. Beth Payne reports that Beth Payne drinks alcohol. Her drug history is not on file.  Family History  Problem Relation Age of Onset  . Liver disease Sister   . Colon cancer Brother    No family status information on file.     There is no immunization history on file for this patient.  Allergies  Allergen Reactions  . Codeine Nausea And Vomiting and Other (See Comments)    Dizziness   . Diclofenac Sodium Swelling and Other (See Comments)    Blurred vision  . Doxycycline Calcium Swelling and Other (See Comments)    Blurred vision  . Zocor [Simvastatin] Other (  See Comments)    Dizziness, weakness     Medications: Patient's Medications  New Prescriptions   No medications on file  Previous Medications   ARIPIPRAZOLE (ABILIFY) 2 MG TABLET    Take 2 mg by mouth daily.   ASPIRIN 325 MG EC TABLET    Take 325 mg by mouth daily.   CHOLECALCIFEROL (VITAMIN D) 1000 UNITS TABLET    Take 2,000 Units by mouth daily.   DOCUSATE SODIUM (COLACE) 50 MG CAPSULE    Take 100 mg by mouth 2 (two) times daily.   INSULIN ASPART (NOVOLOG) 100 UNIT/ML INJECTION    Inject 0-6 Units into the skin 3 (three) times daily with meals. 121-150=0 units 151-200=1 unit 201-250=2 units  251-300=3 units 301-350=4 units 351-400=6 units >400 give 6 units and call MD   INSULIN GLARGINE (LANTUS) 100 UNIT/ML INJECTION    Inject 0.04 mLs (4 Units total) into the skin daily.   MULTIPLE VITAMIN  (MULITIVITAMIN WITH MINERALS) TABS    Take 1 tablet by mouth daily.   OMEPRAZOLE (PRILOSEC) 20 MG CAPSULE    Take 40 mg by mouth daily.   ROSUVASTATIN (CRESTOR) 5 MG TABLET    Take 5 mg by mouth daily.   SERTRALINE (ZOLOFT) 100 MG TABLET    Take 150 mg by mouth daily.  Modified Medications   No medications on file  Discontinued Medications   No medications on file    Review of Systems  Unable to perform ROS: Psychiatric disorder    Filed Vitals:   12/17/14 1917  BP: 118/74  Pulse: 77  Temp: 97.9 F (36.6 C)  Weight: 116 lb (52.617 kg)   Body mass index is 21.21 kg/(m^2).  Physical Exam  Constitutional: Beth Payne appears well-developed.  Lying in bed in NAD, frail appearing. Rolling walker at bedside  HENT:  Mouth/Throat: Oropharynx is clear and moist. No oropharyngeal exudate.  Eyes: Pupils are equal, round, and reactive to light. No scleral icterus.  Neck: Neck supple. Carotid bruit is not present. No tracheal deviation present. No thyromegaly present.  Cardiovascular: Normal rate, regular rhythm and intact distal pulses.  Exam reveals no gallop and no friction rub.   Murmur (1/6 SEM) heard. No LE edema b/l. no calf TTP.   Pulmonary/Chest: Effort normal. No stridor. No respiratory distress. Beth Payne has no wheezes. Beth Payne has rales (inspiratory).  Abdominal: Soft. Bowel sounds are normal. Beth Payne exhibits no distension and no mass. There is no hepatomegaly. There is no tenderness. There is no rebound and no guarding.  Musculoskeletal: Beth Payne exhibits edema.  Lymphadenopathy:    Beth Payne has no cervical adenopathy.  Neurological: Beth Payne is alert.  Skin: Skin is warm and dry. No rash noted.  Psychiatric: Beth Payne has a normal mood and affect. Her behavior is normal.     Labs reviewed: Admission on 12/12/2014, Discharged on 12/16/2014  No results displayed because visit has over 200 results.     CMP Latest Ref Rng 12/15/2014 12/13/2014 12/13/2014  Glucose 65 - 99 mg/dL 161(W) 960(A) 540(J)  BUN 6 - 20  mg/dL <8(J) 14 18  Creatinine 0.44 - 1.00 mg/dL 1.91 4.78 2.95  Sodium 135 - 145 mmol/L 137 137 138  Potassium 3.5 - 5.1 mmol/L 2.9(L) 3.4(L) 3.7  Chloride 101 - 111 mmol/L 105 108 110  CO2 22 - 32 mmol/L 25 21(L) 22  Calcium 8.9 - 10.3 mg/dL 6.2(Z) 9.0 9.2  Total Protein 6.5 - 8.1 g/dL - - -  Total Bilirubin 0.3 - 1.2 mg/dL - - -  Alkaline Phos 38 - 126 U/L - - -  AST 15 - 41 U/L - - -  ALT 14 - 54 U/L - - -    CBC Latest Ref Rng 12/15/2014 12/12/2014 09/08/2011  WBC 4.0 - 10.5 K/uL 5.9 7.5 -  Hemoglobin 12.0 - 15.0 g/dL 6.2(Z) 10.4(L) 12.9  Hematocrit 36.0 - 46.0 % 29.1(L) 32.1(L) 38.0  Platelets 150 - 400 K/uL 189 196 -    Lab Results  Component Value Date   HGBA1C 6.6* 12/12/2014     Nm Myocar Multi W/spect W/wall Motion / Ef  12/15/2014   CLINICAL DATA:  Chest pain.  Coronary artery disease.  Diabetes.  EXAM: MYOCARDIAL IMAGING WITH SPECT (REST AND PHARMACOLOGIC-STRESS)  GATED LEFT VENTRICULAR WALL MOTION STUDY  LEFT VENTRICULAR EJECTION FRACTION  TECHNIQUE: Standard myocardial SPECT imaging was performed after resting intravenous injection of 10 mCi Tc-32m sestamibi. Subsequently, intravenous infusion of Lexiscan was performed under the supervision of the Cardiology staff. At peak effect of the drug, 30 mCi Tc-67m sestamibi was injected intravenously and standard myocardial SPECT imaging was performed. Quantitative gated imaging was also performed to evaluate left ventricular wall motion, and estimate left ventricular ejection fraction.  COMPARISON:  None.  FINDINGS: Perfusion: No reversible left ventricular myocardial perfusion defects seen. A moderate fixed defect is seen involving the apical wall on both the stress and resting images. Given the absence of corresponding motion abnormality, this favors apical thinning over apical wall infarction.  Wall Motion: Normal left ventricular wall motion. No left ventricular dilation.  Left Ventricular Ejection Fraction: 74 %  End diastolic  volume 61 ml  End systolic volume 16 ml  IMPRESSION: 1. No reversible ischemia. Probable apical wall thinning, with apical wall infarction considered less likely.  2. Normal left ventricular wall motion.  3. Left ventricular ejection fraction 74%  4. Low-risk stress test findings*.  *2012 Appropriate Use Criteria for Coronary Revascularization Focused Update: J Am Coll Cardiol. 2012;59(9):857-881. http://content.dementiazones.com.aspx?articleid=1201161   Electronically Signed   By: Myles Rosenthal M.D.   On: 12/15/2014 13:20   Dg Chest Port 1 View  12/12/2014   CLINICAL DATA:  DKA  EXAM: PORTABLE CHEST - 1 VIEW  COMPARISON:  09/08/2011  FINDINGS: Mild cardiac enlargement. Right Coronary artery stent. Mild vascular congestion without edema.  Mild bibasilar atelectasis/ infiltrate.  No effusion.  IMPRESSION: Mild vascular congestion without edema  Mild bibasilar atelectasis/infiltrate.   Electronically Signed   By: Marlan Palau M.D.   On: 12/12/2014 13:30     Assessment/Plan   ICD-9-CM ICD-10-CM   1. DM (diabetes mellitus), type 1 with complications 250.91 E10.8   2. CAD in native artery 414.01 I25.10   3. Pulmonary hypertension 416.8 I27.0   4. Primary osteoarthritis involving multiple joints 715.09 M15.0   5. Gastroesophageal reflux disease without esophagitis 530.81 K21.9   6. Dyslipidemia 272.4 E78.5     --check CBC w diff and BMP  --cont other meds as ordered  --PT/OT as indicated  --GOAL: short term rehab and d/c home when medically appropriate. Communicated with pt and nursing.  --will follow  Srinika Delone S. Ancil Linsey  [facility] [address] [place], [place] [address] [phone] Cell (Monday-Friday 8 AM - 5 PM) [phone] After 5 PM and follow prompts

## 2014-12-23 ENCOUNTER — Inpatient Hospital Stay (HOSPITAL_COMMUNITY)
Admission: EM | Admit: 2014-12-23 | Discharge: 2014-12-27 | DRG: 493 | Disposition: A | Discharge status: Skilled Nursing Facility | Payer: Medicare Other | Attending: Internal Medicine | Admitting: Internal Medicine

## 2014-12-23 ENCOUNTER — Encounter (HOSPITAL_COMMUNITY): Payer: Self-pay | Admitting: Emergency Medicine

## 2014-12-23 ENCOUNTER — Emergency Department (HOSPITAL_COMMUNITY): Payer: Medicare Other

## 2014-12-23 DIAGNOSIS — S82852B Displaced trimalleolar fracture of left lower leg, initial encounter for open fracture type I or II: Secondary | ICD-10-CM | POA: Diagnosis not present

## 2014-12-23 DIAGNOSIS — M79672 Pain in left foot: Secondary | ICD-10-CM | POA: Diagnosis not present

## 2014-12-23 DIAGNOSIS — F329 Major depressive disorder, single episode, unspecified: Secondary | ICD-10-CM | POA: Diagnosis present

## 2014-12-23 DIAGNOSIS — E0865 Diabetes mellitus due to underlying condition with hyperglycemia: Secondary | ICD-10-CM | POA: Diagnosis not present

## 2014-12-23 DIAGNOSIS — Z7982 Long term (current) use of aspirin: Secondary | ICD-10-CM | POA: Diagnosis not present

## 2014-12-23 DIAGNOSIS — R2689 Other abnormalities of gait and mobility: Secondary | ICD-10-CM | POA: Diagnosis not present

## 2014-12-23 DIAGNOSIS — S82492K Other fracture of shaft of left fibula, subsequent encounter for closed fracture with nonunion: Secondary | ICD-10-CM | POA: Diagnosis not present

## 2014-12-23 DIAGNOSIS — D62 Acute posthemorrhagic anemia: Secondary | ICD-10-CM | POA: Diagnosis not present

## 2014-12-23 DIAGNOSIS — M25579 Pain in unspecified ankle and joints of unspecified foot: Secondary | ICD-10-CM | POA: Diagnosis not present

## 2014-12-23 DIAGNOSIS — R6520 Severe sepsis without septic shock: Secondary | ICD-10-CM | POA: Diagnosis not present

## 2014-12-23 DIAGNOSIS — S9302XA Subluxation of left ankle joint, initial encounter: Secondary | ICD-10-CM | POA: Diagnosis not present

## 2014-12-23 DIAGNOSIS — E1065 Type 1 diabetes mellitus with hyperglycemia: Secondary | ICD-10-CM | POA: Diagnosis not present

## 2014-12-23 DIAGNOSIS — M25572 Pain in left ankle and joints of left foot: Secondary | ICD-10-CM | POA: Diagnosis not present

## 2014-12-23 DIAGNOSIS — S8252XA Displaced fracture of medial malleolus of left tibia, initial encounter for closed fracture: Secondary | ICD-10-CM | POA: Diagnosis not present

## 2014-12-23 DIAGNOSIS — Z8673 Personal history of transient ischemic attack (TIA), and cerebral infarction without residual deficits: Secondary | ICD-10-CM

## 2014-12-23 DIAGNOSIS — Q7292 Unspecified reduction defect of left lower limb: Secondary | ICD-10-CM

## 2014-12-23 DIAGNOSIS — E108 Type 1 diabetes mellitus with unspecified complications: Secondary | ICD-10-CM | POA: Diagnosis not present

## 2014-12-23 DIAGNOSIS — M1990 Unspecified osteoarthritis, unspecified site: Secondary | ICD-10-CM | POA: Diagnosis present

## 2014-12-23 DIAGNOSIS — S82899A Other fracture of unspecified lower leg, initial encounter for closed fracture: Secondary | ICD-10-CM | POA: Diagnosis present

## 2014-12-23 DIAGNOSIS — E785 Hyperlipidemia, unspecified: Secondary | ICD-10-CM | POA: Diagnosis not present

## 2014-12-23 DIAGNOSIS — I2510 Atherosclerotic heart disease of native coronary artery without angina pectoris: Secondary | ICD-10-CM | POA: Diagnosis not present

## 2014-12-23 DIAGNOSIS — K219 Gastro-esophageal reflux disease without esophagitis: Secondary | ICD-10-CM | POA: Diagnosis not present

## 2014-12-23 DIAGNOSIS — E1310 Other specified diabetes mellitus with ketoacidosis without coma: Secondary | ICD-10-CM | POA: Diagnosis not present

## 2014-12-23 DIAGNOSIS — S82292A Other fracture of shaft of left tibia, initial encounter for closed fracture: Secondary | ICD-10-CM | POA: Diagnosis not present

## 2014-12-23 DIAGNOSIS — S82852A Displaced trimalleolar fracture of left lower leg, initial encounter for closed fracture: Secondary | ICD-10-CM | POA: Diagnosis not present

## 2014-12-23 DIAGNOSIS — Z9641 Presence of insulin pump (external) (internal): Secondary | ICD-10-CM | POA: Diagnosis present

## 2014-12-23 DIAGNOSIS — W19XXXA Unspecified fall, initial encounter: Secondary | ICD-10-CM

## 2014-12-23 DIAGNOSIS — Z419 Encounter for procedure for purposes other than remedying health state, unspecified: Secondary | ICD-10-CM

## 2014-12-23 DIAGNOSIS — Z6820 Body mass index (BMI) 20.0-20.9, adult: Secondary | ICD-10-CM

## 2014-12-23 DIAGNOSIS — T148XXA Other injury of unspecified body region, initial encounter: Secondary | ICD-10-CM

## 2014-12-23 DIAGNOSIS — S82892K Other fracture of left lower leg, subsequent encounter for closed fracture with nonunion: Secondary | ICD-10-CM

## 2014-12-23 DIAGNOSIS — Z888 Allergy status to other drugs, medicaments and biological substances status: Secondary | ICD-10-CM

## 2014-12-23 DIAGNOSIS — Z885 Allergy status to narcotic agent status: Secondary | ICD-10-CM | POA: Diagnosis not present

## 2014-12-23 DIAGNOSIS — S90512A Abrasion, left ankle, initial encounter: Secondary | ICD-10-CM | POA: Diagnosis not present

## 2014-12-23 DIAGNOSIS — I259 Chronic ischemic heart disease, unspecified: Secondary | ICD-10-CM | POA: Diagnosis not present

## 2014-12-23 DIAGNOSIS — Z955 Presence of coronary angioplasty implant and graft: Secondary | ICD-10-CM | POA: Diagnosis not present

## 2014-12-23 DIAGNOSIS — E1051 Type 1 diabetes mellitus with diabetic peripheral angiopathy without gangrene: Secondary | ICD-10-CM | POA: Diagnosis present

## 2014-12-23 DIAGNOSIS — E119 Type 2 diabetes mellitus without complications: Secondary | ICD-10-CM | POA: Diagnosis not present

## 2014-12-23 DIAGNOSIS — Y92009 Unspecified place in unspecified non-institutional (private) residence as the place of occurrence of the external cause: Secondary | ICD-10-CM

## 2014-12-23 DIAGNOSIS — S9305XA Dislocation of left ankle joint, initial encounter: Secondary | ICD-10-CM

## 2014-12-23 DIAGNOSIS — T148 Other injury of unspecified body region: Secondary | ICD-10-CM | POA: Diagnosis not present

## 2014-12-23 DIAGNOSIS — S82832A Other fracture of upper and lower end of left fibula, initial encounter for closed fracture: Secondary | ICD-10-CM | POA: Diagnosis not present

## 2014-12-23 DIAGNOSIS — G8918 Other acute postprocedural pain: Secondary | ICD-10-CM | POA: Diagnosis not present

## 2014-12-23 DIAGNOSIS — E08351 Diabetes mellitus due to underlying condition with proliferative diabetic retinopathy with macular edema: Secondary | ICD-10-CM | POA: Diagnosis not present

## 2014-12-23 DIAGNOSIS — E860 Dehydration: Secondary | ICD-10-CM | POA: Diagnosis not present

## 2014-12-23 DIAGNOSIS — F339 Major depressive disorder, recurrent, unspecified: Secondary | ICD-10-CM | POA: Diagnosis not present

## 2014-12-23 DIAGNOSIS — Z4789 Encounter for other orthopedic aftercare: Secondary | ICD-10-CM | POA: Diagnosis not present

## 2014-12-23 DIAGNOSIS — Z794 Long term (current) use of insulin: Secondary | ICD-10-CM

## 2014-12-23 DIAGNOSIS — Z9889 Other specified postprocedural states: Secondary | ICD-10-CM

## 2014-12-23 DIAGNOSIS — S82402P Unspecified fracture of shaft of left fibula, subsequent encounter for closed fracture with malunion: Secondary | ICD-10-CM

## 2014-12-23 DIAGNOSIS — Z90710 Acquired absence of both cervix and uterus: Secondary | ICD-10-CM | POA: Diagnosis not present

## 2014-12-23 DIAGNOSIS — I25111 Atherosclerotic heart disease of native coronary artery with angina pectoris with documented spasm: Secondary | ICD-10-CM | POA: Diagnosis not present

## 2014-12-23 DIAGNOSIS — Z471 Aftercare following joint replacement surgery: Secondary | ICD-10-CM | POA: Diagnosis not present

## 2014-12-23 DIAGNOSIS — S82202A Unspecified fracture of shaft of left tibia, initial encounter for closed fracture: Secondary | ICD-10-CM

## 2014-12-23 DIAGNOSIS — M6281 Muscle weakness (generalized): Secondary | ICD-10-CM | POA: Diagnosis not present

## 2014-12-23 DIAGNOSIS — S82892D Other fracture of left lower leg, subsequent encounter for closed fracture with routine healing: Secondary | ICD-10-CM | POA: Diagnosis not present

## 2014-12-23 DIAGNOSIS — Z96662 Presence of left artificial ankle joint: Secondary | ICD-10-CM | POA: Diagnosis not present

## 2014-12-23 DIAGNOSIS — IMO0001 Reserved for inherently not codable concepts without codable children: Secondary | ICD-10-CM

## 2014-12-23 DIAGNOSIS — R739 Hyperglycemia, unspecified: Secondary | ICD-10-CM

## 2014-12-23 HISTORY — DX: Fibromyalgia: M79.7

## 2014-12-23 HISTORY — DX: Low back pain: M54.5

## 2014-12-23 HISTORY — DX: Transient cerebral ischemic attack, unspecified: G45.9

## 2014-12-23 HISTORY — DX: Other chronic pain: G89.29

## 2014-12-23 HISTORY — DX: Low back pain, unspecified: M54.50

## 2014-12-23 HISTORY — DX: Other fracture of unspecified lower leg, initial encounter for closed fracture: S82.899A

## 2014-12-23 HISTORY — DX: Other specified disorders of bone density and structure, unspecified site: M85.80

## 2014-12-23 HISTORY — DX: Type 1 diabetes mellitus without complications: E10.9

## 2014-12-23 HISTORY — DX: Anxiety disorder, unspecified: F41.9

## 2014-12-23 HISTORY — DX: Cardiac murmur, unspecified: R01.1

## 2014-12-23 LAB — CBC WITH DIFFERENTIAL/PLATELET
BASOS ABS: 0 10*3/uL (ref 0.0–0.1)
Basophils Relative: 0 % (ref 0–1)
Eosinophils Absolute: 0 10*3/uL (ref 0.0–0.7)
Eosinophils Relative: 0 % (ref 0–5)
HCT: 32.2 % — ABNORMAL LOW (ref 36.0–46.0)
Hemoglobin: 10.6 g/dL — ABNORMAL LOW (ref 12.0–15.0)
LYMPHS PCT: 12 % (ref 12–46)
Lymphs Abs: 1 10*3/uL (ref 0.7–4.0)
MCH: 27.5 pg (ref 26.0–34.0)
MCHC: 32.9 g/dL (ref 30.0–36.0)
MCV: 83.6 fL (ref 78.0–100.0)
MONOS PCT: 8 % (ref 3–12)
Monocytes Absolute: 0.6 10*3/uL (ref 0.1–1.0)
NEUTROS ABS: 6.2 10*3/uL (ref 1.7–7.7)
Neutrophils Relative %: 80 % — ABNORMAL HIGH (ref 43–77)
PLATELETS: 304 10*3/uL (ref 150–400)
RBC: 3.85 MIL/uL — ABNORMAL LOW (ref 3.87–5.11)
RDW: 14.7 % (ref 11.5–15.5)
WBC: 7.8 10*3/uL (ref 4.0–10.5)

## 2014-12-23 LAB — BASIC METABOLIC PANEL
ANION GAP: 15 (ref 5–15)
BUN: <5 mg/dL — ABNORMAL LOW (ref 6–20)
CHLORIDE: 98 mmol/L — AB (ref 101–111)
CO2: 24 mmol/L (ref 22–32)
CREATININE: 0.83 mg/dL (ref 0.44–1.00)
Calcium: 9.6 mg/dL (ref 8.9–10.3)
GFR calc Af Amer: >60 mL/min (ref >60)
GFR calc non Af Amer: >60 mL/min (ref >60)
GLUCOSE: 377 mg/dL — AB (ref 65–99)
Potassium: 3.7 mmol/L (ref 3.5–5.1)
Sodium: 137 mmol/L (ref 135–145)

## 2014-12-23 LAB — URINALYSIS, ROUTINE W REFLEX MICROSCOPIC
BILIRUBIN URINE: NEGATIVE
Glucose, UA: >1000 mg/dL — AB
Hgb urine dipstick: NEGATIVE
KETONES UR: 40 mg/dL — AB
Leukocytes, UA: NEGATIVE
Nitrite: NEGATIVE
PROTEIN: NEGATIVE mg/dL
Specific Gravity, Urine: 1.019 (ref 1.005–1.030)
Urobilinogen, UA: 0.2 mg/dL (ref 0.0–1.0)
pH: 6.5 (ref 5.0–8.0)

## 2014-12-23 LAB — CBG MONITORING, ED: GLUCOSE-CAPILLARY: 349 mg/dL — AB (ref 65–99)

## 2014-12-23 LAB — GLUCOSE, CAPILLARY
GLUCOSE-CAPILLARY: 332 mg/dL — AB (ref 65–99)
GLUCOSE-CAPILLARY: 289 mg/dL — AB (ref 65–99)
GLUCOSE-CAPILLARY: 234 mg/dL — AB (ref 65–99)
GLUCOSE-CAPILLARY: 137 mg/dL — AB (ref 65–99)
Glucose-Capillary: 261 mg/dL — ABNORMAL HIGH (ref 65–99)
Glucose-Capillary: 158 mg/dL — ABNORMAL HIGH (ref 65–99)

## 2014-12-23 LAB — URINE MICROSCOPIC-ADD ON

## 2014-12-23 MED ORDER — FENTANYL CITRATE (PF) 100 MCG/2ML IJ SOLN
50.0000 ug | Freq: Once | INTRAMUSCULAR | Status: AC
  Administered 2014-12-23: 50 ug via INTRAVENOUS
  Filled 2014-12-23: qty 2

## 2014-12-23 MED ORDER — ADULT MULTIVITAMIN W/MINERALS CH
1.0000 | ORAL_TABLET | Freq: Every day | ORAL | Status: DC
  Administered 2014-12-23 – 2014-12-27 (×4): 1 via ORAL
  Filled 2014-12-23 (×4): qty 1

## 2014-12-23 MED ORDER — ACETAMINOPHEN 325 MG PO TABS
650.0000 mg | ORAL_TABLET | Freq: Four times a day (QID) | ORAL | Status: DC | PRN
  Administered 2014-12-24: 650 mg via ORAL
  Filled 2014-12-23 (×2): qty 2

## 2014-12-23 MED ORDER — ACETAMINOPHEN 650 MG RE SUPP: 650.0000 mg | Freq: Four times a day (QID) | RECTAL | Status: DC | PRN

## 2014-12-23 MED ORDER — PROPOFOL 10 MG/ML IV BOLUS
INTRAVENOUS | Status: AC | PRN
  Administered 2014-12-23: 25 mg via INTRAVENOUS

## 2014-12-23 MED ORDER — ASPIRIN EC 325 MG PO TBEC
325.0000 mg | DELAYED_RELEASE_TABLET | Freq: Every day | ORAL | Status: DC
  Administered 2014-12-23 – 2014-12-24 (×2): 325 mg via ORAL
  Filled 2014-12-23 (×2): qty 1

## 2014-12-23 MED ORDER — PANTOPRAZOLE SODIUM 40 MG PO TBEC
40.0000 mg | DELAYED_RELEASE_TABLET | Freq: Every day | ORAL | Status: DC
  Administered 2014-12-23 – 2014-12-27 (×5): 40 mg via ORAL
  Filled 2014-12-23 (×5): qty 1

## 2014-12-23 MED ORDER — KETAMINE HCL 10 MG/ML IJ SOLN
0.5000 mg/kg | Freq: Once | INTRAMUSCULAR | Status: AC
  Filled 2014-12-23: qty 2.5

## 2014-12-23 MED ORDER — ARIPIPRAZOLE 2 MG PO TABS
2.0000 mg | ORAL_TABLET | Freq: Every day | ORAL | Status: DC
  Administered 2014-12-23 – 2014-12-26 (×4): 2 mg via ORAL
  Filled 2014-12-23 (×6): qty 1

## 2014-12-23 MED ORDER — ONDANSETRON HCL 4 MG/2ML IJ SOLN
4.0000 mg | Freq: Once | INTRAMUSCULAR | Status: AC
Start: 2014-12-23 — End: 2014-12-23
  Administered 2014-12-23: 4 mg via INTRAVENOUS
  Filled 2014-12-23: qty 2

## 2014-12-23 MED ORDER — PNEUMOCOCCAL VAC POLYVALENT 25 MCG/0.5ML IJ INJ
0.5000 mL | INJECTION | INTRAMUSCULAR | Status: AC
  Administered 2014-12-24: 0.5 mL via INTRAMUSCULAR

## 2014-12-23 MED ORDER — KETAMINE HCL 10 MG/ML IJ SOLN
INTRAMUSCULAR | Status: AC | PRN
  Administered 2014-12-23: 25 mg via INTRAVENOUS
  Administered 2014-12-23: 12.5 mg via INTRAVENOUS

## 2014-12-23 MED ORDER — DEXTROSE-NACL 5-0.45 % IV SOLN
INTRAVENOUS | Status: DC
  Administered 2014-12-23: 23:00:00 via INTRAVENOUS

## 2014-12-23 MED ORDER — ROSUVASTATIN CALCIUM 5 MG PO TABS
5.0000 mg | ORAL_TABLET | Freq: Every day | ORAL | Status: DC
  Administered 2014-12-23 – 2014-12-27 (×4): 5 mg via ORAL
  Filled 2014-12-23 (×6): qty 1

## 2014-12-23 MED ORDER — INSULIN REGULAR BOLUS VIA INFUSION
0.0000 [IU] | Freq: Three times a day (TID) | INTRAVENOUS | Status: DC
  Filled 2014-12-23: qty 10

## 2014-12-23 MED ORDER — DOCUSATE SODIUM 100 MG PO CAPS
100.0000 mg | ORAL_CAPSULE | Freq: Two times a day (BID) | ORAL | Status: DC
  Administered 2014-12-23 – 2014-12-27 (×7): 100 mg via ORAL
  Filled 2014-12-23 (×7): qty 1

## 2014-12-23 MED ORDER — POLYETHYLENE GLYCOL 3350 17 G PO PACK: 17.0000 g | PACK | Freq: Every day | ORAL | Status: DC | PRN

## 2014-12-23 MED ORDER — VITAMIN D 1000 UNITS PO TABS
2000.0000 [IU] | ORAL_TABLET | Freq: Every day | ORAL | Status: DC
  Administered 2014-12-23 – 2014-12-27 (×4): 2000 [IU] via ORAL
  Filled 2014-12-23 (×4): qty 2

## 2014-12-23 MED ORDER — SODIUM CHLORIDE 0.9 % IV SOLN: INTRAVENOUS | Status: AC

## 2014-12-23 MED ORDER — FENTANYL CITRATE (PF) 100 MCG/2ML IJ SOLN
50.0000 ug | Freq: Once | INTRAMUSCULAR | Status: AC
Start: 2014-12-23 — End: 2014-12-23
  Administered 2014-12-23: 50 ug via INTRAVENOUS
  Filled 2014-12-23: qty 2

## 2014-12-23 MED ORDER — SODIUM CHLORIDE 0.9 % IV SOLN
INTRAVENOUS | Status: DC
  Administered 2014-12-23: 11:00:00 via INTRAVENOUS

## 2014-12-23 MED ORDER — PROPOFOL 10 MG/ML IV BOLUS
0.5000 mg/kg | Freq: Once | INTRAVENOUS | Status: AC
  Filled 2014-12-23: qty 20

## 2014-12-23 MED ORDER — KETAMINE HCL 10 MG/ML IJ SOLN
INTRAMUSCULAR | Status: AC | PRN
  Administered 2014-12-23: 25 mg via INTRAVENOUS

## 2014-12-23 MED ORDER — ENOXAPARIN SODIUM 40 MG/0.4ML ~~LOC~~ SOLN
40.0000 mg | SUBCUTANEOUS | Status: DC
  Administered 2014-12-23: 40 mg via SUBCUTANEOUS
  Filled 2014-12-23: qty 0.4

## 2014-12-23 MED ORDER — SERTRALINE HCL 50 MG PO TABS
150.0000 mg | ORAL_TABLET | Freq: Every day | ORAL | Status: DC
  Administered 2014-12-23 – 2014-12-27 (×4): 150 mg via ORAL
  Filled 2014-12-23 (×8): qty 1

## 2014-12-23 MED ORDER — DEXTROSE 50 % IV SOLN: 25.0000 mL | INTRAVENOUS | Status: DC | PRN

## 2014-12-23 MED ORDER — PROPOFOL 10 MG/ML IV BOLUS
INTRAVENOUS | Status: AC | PRN
  Administered 2014-12-23: 25 mg via INTRAVENOUS
  Administered 2014-12-23: 12.5 mg via INTRAVENOUS

## 2014-12-23 MED ORDER — SODIUM CHLORIDE 0.9 % IV SOLN
INTRAVENOUS | Status: DC
  Administered 2014-12-23: 2.7 [IU]/h via INTRAVENOUS
  Filled 2014-12-23: qty 2.5

## 2014-12-23 MED ORDER — SODIUM CHLORIDE 0.9 % IV SOLN
INTRAVENOUS | Status: DC
  Administered 2014-12-23 – 2014-12-26 (×3): via INTRAVENOUS

## 2014-12-23 NOTE — Progress Notes (Signed)
Orthopedic Tech Progress Note Patient Details:  Beth Payne 11/11/1940 [id number]  Ortho Devices Type of Ortho Device: Ace wrap, Post (short leg) splint, Stirrup splint Ortho Device/Splint Location: rle Ortho Device/Splint Interventions: Application Ankle manipulation by Dr, Pryor CuriaLentz  Beth Payne 12/23/2014, 12:35 PM

## 2014-12-23 NOTE — Sedation Documentation (Signed)
Pt has returned from xray

## 2014-12-23 NOTE — Sedation Documentation (Signed)
Pt completely a/o x4. Speaking in complete sentences.

## 2014-12-23 NOTE — Sedation Documentation (Signed)
Patient denies pain and is resting comfortably.  

## 2014-12-23 NOTE — Sedation Documentation (Signed)
MD Effie ShyWentz pushing 5 cc of Propofol and 5cc of Ketamine

## 2014-12-23 NOTE — Sedation Documentation (Signed)
Procedure completed. Pt experienced no adverse reaction from sedation. Airway intact, pt breathing freely. Able to move all extremities. Pt family friend at bedside, states "shes acting herself." vitas remained stable during procedure.

## 2014-12-23 NOTE — ED Provider Notes (Signed)
CSN: [id number]     Arrival date & time 12/23/14  4098 History   First MD Initiated Contact with Patient 12/23/14 [phone]     Chief Complaint  Patient presents with  . Fall     (Consider location/radiation/quality/duration/timing/severity/associated sxs/prior Treatment) HPI  Beth Payne is a 74 y.o. female who presents for evaluation of injury, left ankle and fall. She states that she fell in her bathroom last night, and was assisted to bed, by a a day. Today she is unable to walk because of left ankle pain. She is unclear why she fell. She is a poor historian   Level V Caveat- Poor Historian   Past Medical History  Diagnosis Date  . Diabetes mellitus   . Arthritis   . Coronary artery disease   . Cataract   . Acid reflux   . Depression   . Stroke   . GERD (gastroesophageal reflux disease)   . Retinopathy   . H/O cardiac catheterization    Past Surgical History  Procedure Laterality Date  . Abdominal hysterectomy    . Cataract surgery     Family History  Problem Relation Age of Onset  . Liver disease Sister   . Colon cancer Brother    History  Substance Use Topics  . Smoking status: Never Smoker   . Smokeless tobacco: Not on file  . Alcohol Use: Yes   OB History    No data available     Review of Systems  Unable to perform ROS     Allergies  Diclofenac sodium; Doxycycline calcium; Codeine; and Zocor  Home Medications   Prior to Admission medications   Medication Sig Start Date End Date Taking? Authorizing Provider  ARIPiprazole (ABILIFY) 2 MG tablet Take 2 mg by mouth at bedtime.    Yes Historical Provider, MD  aspirin 325 MG EC tablet Take 325 mg by mouth daily.   Yes Historical Provider, MD  cholecalciferol (VITAMIN D) 1000 UNITS tablet Take 2,000 Units by mouth daily.   Yes Historical Provider, MD  docusate sodium (COLACE) 100 MG capsule Take 100 mg by mouth 2 (two) times daily.   Yes Historical Provider, MD  Insulin Human (INSULIN PUMP) SOLN  Inject 1 each into the skin continuous. HUMALOG   Yes Historical Provider, MD  Multiple Vitamin (MULITIVITAMIN WITH MINERALS) TABS Take 1 tablet by mouth daily.   Yes Historical Provider, MD  omeprazole (PRILOSEC) 20 MG capsule Take 40 mg by mouth daily.   Yes Historical Provider, MD  rosuvastatin (CRESTOR) 5 MG tablet Take 5 mg by mouth daily.   Yes Historical Provider, MD  sertraline (ZOLOFT) 100 MG tablet Take 150 mg by mouth daily.   Yes Historical Provider, MD  insulin aspart (NOVOLOG) 100 UNIT/ML injection Inject 0-6 Units into the skin 3 (three) times daily with meals. 121-150=0 units 151-200=1 unit 201-250=2 units  251-300=3 units 301-350=4 units 351-400=6 units >400 give 6 units and call MD 12/15/14   Starleen Arms, MD  insulin glargine (LANTUS) 100 UNIT/ML injection Inject 0.04 mLs (4 Units total) into the skin daily. 12/16/14   Leana Roe Elgergawy, MD  insulin lispro (HUMALOG) 100 UNIT/ML injection Inject into the skin 3 (three) times daily before meals.    Historical Provider, MD   BP 150/62 mmHg  Pulse 75  Temp(Src) 98.2 F (36.8 C) (Oral)  Resp 18  Ht  (1.575 m)  Wt 111 lb (50.349 kg)  BMI 20.30 kg/m2  SpO2 100% Physical Exam  Constitutional: She is oriented to person, place, and time. She appears well-developed.  Elderly, frail  HENT:  Head: Normocephalic and atraumatic.  Right Ear: External ear normal.  Left Ear: External ear normal.  Eyes: Conjunctivae and EOM are normal. Pupils are equal, round, and reactive to light.  Neck: Normal range of motion and phonation normal. Neck supple.  Cardiovascular: Normal rate, regular rhythm and normal heart sounds.   Pulmonary/Chest: Effort normal and breath sounds normal. She exhibits no bony tenderness.  Abdominal: Soft. There is no tenderness.  Musculoskeletal: Normal range of motion.  Left ankle tender, swollen and deformed. Normal dorsalis pedis pulse left foot. Small abrasion over the medial left ankle She is  able to move toes of left foot without problems. Normal range of motion both arms. Right leg and left knee.  Neurological: She is alert and oriented to person, place, and time. No cranial nerve deficit or sensory deficit. She exhibits normal muscle tone. Coordination normal.  Skin: Skin is warm, dry and intact.  Psychiatric: She has a normal mood and affect. Her behavior is normal.  Nursing note and vitals reviewed.   ED Course  Procedures (including critical care time) Initial exam with clinically isolated injury to left ankle. Fall, probably mechanical, but she is a poor historian, so screening evaluation was done.  Medications  0.9 %  sodium chloride infusion ( Intravenous Stopped 12/23/14 1456)  fentaNYL (SUBLIMAZE) injection 50 mcg (50 mcg Intravenous Given 12/23/14 1039)  ondansetron (ZOFRAN) injection 4 mg (4 mg Intravenous Given 12/23/14 1039)  fentaNYL (SUBLIMAZE) injection 50 mcg (50 mcg Intravenous Given 12/23/14 1134)  propofol (DIPRIVAN) 10 mg/mL bolus/IV push 25.2 mg (0 mg Intravenous Duplicate 12/23/14 1217)  ketamine (KETALAR) injection 25 mg (0 mg Intravenous Duplicate 12/23/14 1217)  ketamine (KETALAR) injection (25 mg Intravenous Given 12/23/14 1217)  propofol (DIPRIVAN) 10 mg/mL bolus/IV push ( Intravenous Stopped 12/23/14 1217)  propofol (DIPRIVAN) 10 mg/mL bolus/IV push ( Intravenous Stopped 12/23/14 1432)  ketamine (KETALAR) injection (12.5 mg Intravenous Given 12/23/14 1432)  fentaNYL (SUBLIMAZE) injection 50 mcg (50 mcg Intravenous Given 12/23/14 1602)    Patient Vitals for the past 24 hrs:  BP Temp Temp src Pulse Resp SpO2 Height Weight  12/23/14 1600 150/62 mmHg - - 75 18 100 % - -  12/23/14 1555 138/55 mmHg - - 79 - 97 % - -  12/23/14 1550 155/60 mmHg - - 77 23 98 % - -  12/23/14 1540 150/63 mmHg - - 85 20 99 % - -  12/23/14 1530 144/57 mmHg - - 78 19 100 % - -  12/23/14 1525 149/60 mmHg - - 81 15 100 % - -  12/23/14 1520 150/65 mmHg - - 79 25 99 % - -  12/23/14  1515 146/57 mmHg - - 76 19 100 % - -  12/23/14 1500 148/56 mmHg - - 82 20 100 % - -  12/23/14 1445 148/59 mmHg - - 81 25 100 % - -  12/23/14 1440 155/62 mmHg - - 78 26 100 % - -  12/23/14 1438 158/57 mmHg - - 75 21 100 % - -  12/23/14 1435 155/66 mmHg - - 84 26 100 % - -  12/23/14 1430 153/60 mmHg - - 82 19 100 % - -  12/23/14 1342 158/60 mmHg - - 82 12 99 % - -  12/23/14 1320 156/73 mmHg - - 77 20 98 % - -  12/23/14 1317 156/73 mmHg - - 85 16 99 % - -  12/23/14 1302 150/56 mmHg - - 77 22 100 % - -  12/23/14 1247 154/58 mmHg - - 75 24 100 % - -  12/23/14 1232 165/60 mmHg - - 77 23 100 % - -  12/23/14 1226 162/66 mmHg - - 72 23 100 % - -  12/23/14 1221 142/58 mmHg - - 77 22 100 % - -  12/23/14 1217 157/59 mmHg - - 83 18 100 % - -  12/23/14 1145 (!) 133/54 mmHg - - 76 17 96 % - -  12/23/14 1030 163/64 mmHg - - 82 20 99 % - -  12/23/14 1015 168/67 mmHg - - 85 20 99 % - -  12/23/14 1000 164/67 mmHg - - 80 20 99 % - -  12/23/14 0954 160/59 mmHg 98.2 F (36.8 C) Oral 82 18 100 % 5\' 2"  (1.575 m) 111 lb (50.349 kg)   11:30- . She continues to have a normal palpable pulse in the left foot. Additional narcotic analgesia ordered, will consult with orthopedics prior to attempt at reduction. Potential medication side effects were discussed with the patient; let me know if any occur.  Case discussed with orthopedics prior to first reduction, he agrees with attempt at reduction, by ED services.  14:00- imaging after first attempt at reduction, showed no change.  14:45- second sedation , for procedural reduction. She tolerated this well. Imaging at bedside reveals improvement with persistent lateral subluxation of the talus on the tibia  15:00- we'll contact orthopedics again to discuss the partial reduction, with persistent subluxation.   Procedural sedation x 2 Performed by: Flint Melter Consent: Verbal consent obtained. Risks and benefits: risks, benefits and alternatives were  discussed Required items: required blood products, implants, devices, and special equipment available Patient identity confirmed: arm band and provided demographic data Time out: Immediately prior to procedure a "time out" was called to verify the correct patient, procedure, equipment, support staff and site/side marked as required.  Sedation type: moderate (conscious) sedation NPO time confirmed and considedered  Sedatives: PROPOFOL and Ketamine X 2- 37.5 mg each  Physician Time at Bedside: 40 minutes  Vitals: Vital signs were monitored during sedation. Cardiac Monitor, pulse oximeter Patient tolerance: Patient tolerated the procedure well with no immediate complications. Comments: Pt with uneventful recovered. Returned to pre-procedural sedation baseline    Reduction of dislocation Date/Time: 4:19 PM Performed by: Flint Melter Authorized by: Flint Melter Consent: Verbal consent obtained. Risks and benefits: risks, benefits and alternatives were discussed Consent given by: patient Required items: required blood products, implants, devices, and special equipment available Time out: Immediately prior to procedure a "time out" was called to verify the correct patient, procedure, equipment, support staff and site/side marked as required.  Patient sedated: Ketamine and Propofol  Vitals: Vital signs were monitored during sedation. Patient tolerance: Patient tolerated the procedure well with no immediate complications. Joint: Left ankle Reduction technique: Inversion, varus pressure and dorsiflexion.  Case discussed with Dr. Roda Shutters after second reduction. He decided to come to the ED and evaluate the patient.    4:13 PM Reevaluation with update and discussion. After initial assessment and treatment, an updated evaluation reveals clinical examination remains unchanged. She has been seen by orthopedics, Dr. Roda Shutters, who recommends that she be admitted for hospital treatment, and set up  for surgical repair of her ankle fracture dislocation. He was unable to get it safely stabilized for discharge, while in the emergency department. Mancel Bale L   4:14 PM-Consult complete with Hospitalist. Patient case  explained and discussed. She agrees to admit patient for further evaluation and treatment. Call ended at 16:35  Labs Review Labs Reviewed  URINALYSIS, ROUTINE W REFLEX MICROSCOPIC (NOT AT [facility]) - Abnormal; Notable for the following:    Glucose, UA >1000 (*)    Ketones, ur 40 (*)    All other components within normal limits  BASIC METABOLIC PANEL - Abnormal; Notable for the following:    Chloride 98 (*)    Glucose, Bld 377 (*)    BUN <5 (*)    All other components within normal limits  CBC WITH DIFFERENTIAL/PLATELET - Abnormal; Notable for the following:    RBC 3.85 (*)    Hemoglobin 10.6 (*)    HCT 32.2 (*)    Neutrophils Relative % 80 (*)    All other components within normal limits  CBG MONITORING, ED - Abnormal; Notable for the following:    Glucose-Capillary 349 (*)    All other components within normal limits  URINE CULTURE  URINE MICROSCOPIC-ADD ON    Imaging Review Dg Ankle 2 Views Left  12/23/2014   CLINICAL DATA:  Post reduction LEFT ankle  EXAM: LEFT ANKLE - 2 VIEW  COMPARISON:  12/23/2014  FINDINGS: Fiberglass splint material obscures bone detail.  Marked osseous demineralization.  Persistent significantly displaced and angulated bimalleolar fractures.  Persistent lateral tibiotalar dislocation, question impacted upon lateral margin of distal tibia.  No new bony abnormalities identified.  Scattered atherosclerotic calcification.  IMPRESSION: Displaced and angulated bimalleolar fractures little changed.  Persistent lateral tibiotalar dislocation as above.   Electronically Signed   By: Ulyses SouthwardMark  Boles M.D.   On: 12/23/2014 13:51   Dg Ankle Complete Left  12/23/2014   CLINICAL DATA:  Larey SeatFell last night walking to the restroom. Pain and deformity.  EXAM: LEFT ANKLE  COMPLETE - 3+ VIEW  COMPARISON:  None.  FINDINGS: Fracture dislocation at the ankle joint with an oblique fracture of the distal fibula and transverse fracture of the medial malleolus. Posterior lip of the tibia appears intact. Dislocation at the talar dome relative to the distal tibial articular surface with talar dome being laterally position and possibly impacted upon the lateral margin of the tibia.  IMPRESSION: Fracture dislocation of the ankle joint as described above.   Electronically Signed   By: Paulina FusiMark  Shogry M.D.   On: 12/23/2014 11:05   Dg Ankle Left Port  12/23/2014   CLINICAL DATA:  Postreduction radiographs are in close reduction of left ankle fractures.  EXAM: PORTABLE LEFT ANKLE - 2 VIEW  COMPARISON:  12/23/2014 at 1327 hours and 1059 hours  FINDINGS: Improved fracture reduction since the earlier postreduction study.  The talus is now all partly reduced. It remains subluxed laterally in relation to the distal tibial articular surface, by approximately 7 mm. Fractures of the medial malleolus and distal fibula are displaced laterally by a similar degree.  IMPRESSION: Improved reduction of the bimalleolar ankle fracture. The displaced talus has been reduced, but remains subluxed laterally in relation to the distal tibia by 7 mm.   Electronically Signed   By: Amie Portlandavid  Ormond M.D.   On: 12/23/2014 15:00     EKG Interpretation   Date/Time:  Monday December 23 2014 09:48:56 EDT Ventricular Rate:  80 PR Interval:  153 QRS Duration: 86 QT Interval:  374 QTC Calculation: 431 R Axis:   113 Text Interpretation:  Sinus rhythm Left posterior fascicular block Low  voltage with right axis deviation Consider anterior infarct Since last  tracing nonspecific  anterolateral ST abnormality Confirmed by [facility]  MD,  Mende Biswell [phone]) on 12/23/2014 10:10:27 AM      MDM   Final diagnoses:  Status post closed reduction with internal fixation  Reduction defect of lower extremity, left  Fall, initial encounter   Dislocation, ankle, left, initial encounter  Fractured tibia, left, closed, initial encounter  Fracture, fibula, left, closed, with malunion, subsequent encounter  Hyperglycemia  Abrasion of left ankle, initial encounter    Apparent mechanical fall with complicated left ankle injury with fracture dislocation. Incidental hyperglycemia. Patient has a unstable ankle injury, with ankle abrasion, but the fracture is not open. She will require admission for in-hospital treatment prior to discharge to her usual home setting.  Nursing Notes Reviewed/ Care Coordinated, and agree without changes. Applicable Imaging Reviewed.  Interpretation of Laboratory Data incorporated into ED treatment   Plan: Admit    Mancel Bale, MD 12/23/14 [phone]

## 2014-12-23 NOTE — Sedation Documentation (Signed)
Ankle re-casted and elevated with pillows and towels. Portable xray called and on the way.

## 2014-12-23 NOTE — ED Notes (Signed)
Pt back from x-ray.

## 2014-12-23 NOTE — ED Notes (Signed)
Dr. Roda ShuttersXu at bedside assessing pt ankle.

## 2014-12-23 NOTE — ED Notes (Signed)
CBG - 349 

## 2014-12-23 NOTE — Sedation Documentation (Signed)
Family friend at bedside.

## 2014-12-23 NOTE — H&P (Signed)
[facility] History and Physical  Beth Payne WJX:[id number] DOB: [birth year] DOA: 12/23/2014  Referring physician: er PCP: [name], MD   Chief Complaint: ankle pain  HPI: Beth Payne is a 74 y.o. female  Who presents with left ankle pain and swelling.  She says she was walking to the bathroom with her walker last night when her foot became twisted (chart reports that her foot was caught in a wheelchair 2 days ago.    She was recently in the hospital from 7/7-7/11: was on Lantus 12 units the night before, the other episodes of hypoglycemia happened during this other night where patient did not receive Lantus, so patient was discharged on Lantus 4 units subcutaneous daily, and custom insulin sliding-scale  ER physician attempted reduction without success, Dr. Roda Shutters saw patient in ER, and reduced it.  He plans on ORIF on Wednesday if blood sugars are controlled.   Review of Systems:  All systems reviewed, negative unless stated above    Past Medical History  Diagnosis Date  . Diabetes mellitus   . Arthritis   . Coronary artery disease   . Cataract   . Acid reflux   . Depression   . Stroke   . GERD (gastroesophageal reflux disease)   . Retinopathy   . H/O cardiac catheterization    Past Surgical History  Procedure Laterality Date  . Abdominal hysterectomy    . Cataract surgery     Social History:  reports that she has never smoked. She does not have any smokeless tobacco history on file. She reports that she drinks alcohol. Her drug history is not on file.  Allergies  Allergen Reactions  . Diclofenac Sodium Swelling and Other (See Comments)    Blurred vision  . Doxycycline Calcium Swelling and Other (See Comments)    Blurred vision  . Codeine Nausea And Vomiting and Other (See Comments)    Dizziness   . Zocor [Simvastatin] Other (See Comments)    Dizziness, weakness     Family History  Problem Relation Age of Onset  . Liver disease Sister     . Colon cancer Brother     Prior to Admission medications   Medication Sig Start Date End Date Taking? Authorizing Provider  ARIPiprazole (ABILIFY) 2 MG tablet Take 2 mg by mouth at bedtime.    Yes Historical Provider, MD  aspirin 325 MG EC tablet Take 325 mg by mouth daily.   Yes Historical Provider, MD  cholecalciferol (VITAMIN D) 1000 UNITS tablet Take 2,000 Units by mouth daily.   Yes Historical Provider, MD  docusate sodium (COLACE) 100 MG capsule Take 100 mg by mouth 2 (two) times daily.   Yes Historical Provider, MD  Insulin Human (INSULIN PUMP) SOLN Inject 1 each into the skin continuous. HUMALOG   Yes Historical Provider, MD  Multiple Vitamin (MULITIVITAMIN WITH MINERALS) TABS Take 1 tablet by mouth daily.   Yes Historical Provider, MD  omeprazole (PRILOSEC) 20 MG capsule Take 40 mg by mouth daily.   Yes Historical Provider, MD  rosuvastatin (CRESTOR) 5 MG tablet Take 5 mg by mouth daily.   Yes Historical Provider, MD  sertraline (ZOLOFT) 100 MG tablet Take 150 mg by mouth daily.   Yes Historical Provider, MD  insulin aspart (NOVOLOG) 100 UNIT/ML injection Inject 0-6 Units into the skin 3 (three) times daily with meals. 121-150=0 units 151-200=1 unit 201-250=2 units  251-300=3 units 301-350=4 units 351-400=6 units >400 give 6 units and call MD 12/15/14  Starleen Armsawood S Elgergawy, MD  insulin glargine (LANTUS) 100 UNIT/ML injection Inject 0.04 mLs (4 Units total) into the skin daily. 12/16/14   Leana Roeawood S Elgergawy, MD  insulin lispro (HUMALOG) 100 UNIT/ML injection Inject into the skin 3 (three) times daily before meals.    Historical Provider, MD   Physical Exam: Filed Vitals:   12/23/14 1555 12/23/14 1600 12/23/14 1630 12/23/14 1645  BP: 138/55 150/62 138/53 144/56  Pulse: 79 75 81 81  Temp:      TempSrc:      Resp:  18 19 18   Height:      Weight:      SpO2: 97% 100% 100% 99%    Wt Readings from Last 3 Encounters:  12/23/14 50.349 kg (111 lb)  12/17/14 52.617 kg (116 lb)   12/16/14 51.937 kg (114 lb 8 oz)    General:  Appears calm and comfortable, NAD Eyes: PERRL, normal lids, irises & conjunctiva ENT: grossly normal hearing, lips & tongue Neck: no LAD, masses or thyromegaly Cardiovascular: RRR, +murmur. No LE edema. Respiratory: CTA bilaterally, no w/r/r. Normal respiratory effort. Abdomen: soft, ntnd Skin: no rash or induration seen on limited exam Musculoskeletal: left leg elevated and in ACE wrap, good cap refil Psychiatric: grossly normal mood and affect, speech fluent and appropriate           Labs on Admission:  Basic Metabolic Panel:  Recent Labs Lab 12/23/14 1042  NA 137  K 3.7  CL 98*  CO2 24  GLUCOSE 377*  BUN <5*  CREATININE 0.83  CALCIUM 9.6   Liver Function Tests: No results for input(s): AST, ALT, ALKPHOS, BILITOT, PROT, ALBUMIN in the last 168 hours. No results for input(s): LIPASE, AMYLASE in the last 168 hours. No results for input(s): AMMONIA in the last 168 hours. CBC:  Recent Labs Lab 12/23/14 1042  WBC 7.8  NEUTROABS 6.2  HGB 10.6*  HCT 32.2*  MCV 83.6  PLT 304   Cardiac Enzymes: No results for input(s): CKTOTAL, CKMB, CKMBINDEX, TROPONINI in the last 168 hours.  BNP (last 3 results) No results for input(s): BNP in the last 8760 hours.  ProBNP (last 3 results) No results for input(s): PROBNP in the last 8760 hours.  CBG:  Recent Labs Lab 12/23/14 1614  GLUCAP 349*    Radiological Exams on Admission: Dg Ankle 2 Views Left  12/23/2014   CLINICAL DATA:  Post reduction LEFT ankle  EXAM: LEFT ANKLE - 2 VIEW  COMPARISON:  12/23/2014  FINDINGS: Fiberglass splint material obscures bone detail.  Marked osseous demineralization.  Persistent significantly displaced and angulated bimalleolar fractures.  Persistent lateral tibiotalar dislocation, question impacted upon lateral margin of distal tibia.  No new bony abnormalities identified.  Scattered atherosclerotic calcification.  IMPRESSION: Displaced and  angulated bimalleolar fractures little changed.  Persistent lateral tibiotalar dislocation as above.   Electronically Signed   By: Ulyses SouthwardMark  Boles M.D.   On: 12/23/2014 13:51   Dg Ankle Complete Left  12/23/2014   CLINICAL DATA:  Larey SeatFell last night walking to the restroom. Pain and deformity.  EXAM: LEFT ANKLE COMPLETE - 3+ VIEW  COMPARISON:  None.  FINDINGS: Fracture dislocation at the ankle joint with an oblique fracture of the distal fibula and transverse fracture of the medial malleolus. Posterior lip of the tibia appears intact. Dislocation at the talar dome relative to the distal tibial articular surface with talar dome being laterally position and possibly impacted upon the lateral margin of the tibia.  IMPRESSION: Fracture dislocation  of the ankle joint as described above.   Electronically Signed   By: Paulina Fusi M.D.   On: 12/23/2014 11:05   Dg Ankle Left Port  12/23/2014   CLINICAL DATA:  Post reduction  EXAM: PORTABLE LEFT ANKLE - 2 VIEW  COMPARISON:  Portable exam 1613 hours compared to earlier studies of 12/23/2014  FINDINGS: Osseous demineralization.  Previously identified lateral talar subluxation appear significantly improved.  Mildly displaced lateral malleolar fracture little changed.  Question mild medial displacement of media malleolar fragment versus artifact from superimposed plaster splint.  IMPRESSION: Improved tibiotalar alignment post reduction.  Mildly displaced bimalleolar fractures as above.   Electronically Signed   By: Ulyses Southward M.D.   On: 12/23/2014 16:23   Dg Ankle Left Port  12/23/2014   CLINICAL DATA:  Postreduction radiographs are in close reduction of left ankle fractures.  EXAM: PORTABLE LEFT ANKLE - 2 VIEW  COMPARISON:  12/23/2014 at 1327 hours and 1059 hours  FINDINGS: Improved fracture reduction since the earlier postreduction study.  The talus is now all partly reduced. It remains subluxed laterally in relation to the distal tibial articular surface, by approximately 7  mm. Fractures of the medial malleolus and distal fibula are displaced laterally by a similar degree.  IMPRESSION: Improved reduction of the bimalleolar ankle fracture. The displaced talus has been reduced, but remains subluxed laterally in relation to the distal tibia by 7 mm.   Electronically Signed   By: Amie Portland M.D.   On: 12/23/2014 15:00    EKG: Independently reviewed sinus with LPFB  Assessment/Plan Active Problems:   CAD s/p stent to mid-RCA in 2001.   DM (diabetes mellitus), type 1 with complications   Fracture, ankle    Left Ankle fx: reduced in the ER, plan for ORIF on Wednesday by Dr. Roda Shutters - pain control as needed -elevate -bed rest  DM- type 1; poorly controlled- recently in hospital for DKA- will place on insulin gtt to get an idea of how much lantus she is requiring and convert to lantus/SSI for optimal control; will be NPO after midnight on Tuesday -has insulin pump but there was questions as to if the pump was working (Dr. Evlyn Kanner)  CAD- no chest pain with activity EF in 7/16 was 55-60% Low risk stress test- 7/16  Anemia -monitor Hgb with surgery   Dr. Roda Shutters- orthopedic surgeon   Code Status: full DVT Prophylaxis: Family Communication: family at bedside Disposition Plan: ORIF on WEdnesday- from SNF  Time spent: 65 min  Marlin Canary [facility] Pager [phone]

## 2014-12-23 NOTE — Sedation Documentation (Signed)
Pt transporting to xray.  

## 2014-12-23 NOTE — Sedation Documentation (Signed)
Portable xray at bedside.

## 2014-12-23 NOTE — ED Notes (Signed)
Bedside report given to Stephanie, EMT 

## 2014-12-23 NOTE — Sedation Documentation (Signed)
Pt had no adverse reaction to sedation medications. Airway intact. Breathing unlabored @ 18 R/min. Pt is speaking in complete sentences and eyes are opening spontaneously. A/O x4 at current time. VSS.

## 2014-12-23 NOTE — Sedation Documentation (Signed)
Pt verbalizes understanding of procedure. MD Effie ShyWentz at bedside discussing procedure with pt. Pt has signed consent form, at bedside. Airway cart outside of pt room as well as crash cart. Ambu bag set up at bedside. Pt monitored by 5 lead, pulse ox, bp and ETCO2. NS running at 999 per verbal order from MD Effie ShyWentz.

## 2014-12-23 NOTE — Consult Note (Signed)
ORTHOPAEDIC CONSULTATION  REQUESTING PHYSICIAN: Mancel Bale, MD  Chief Complaint: Left ankle injury  HPI: Beth Payne is a 74 y.o. female who presents with left ankle injury from nursing home.  Patient got her left foot caught in wheelchair 2 days ago and was brought to the ER today.  Patient was recently admitted for DKA and has been in wheelchair since.  Ambulatory at baseline prior to DKA.  Endorses severe pain and deformity of left ankle.  Denies f/c/n/v.  Pain worse with movement.  Sharp extreme pain with movement.  Does not radiate.  Ortho consulted for ankle fx dislocation  Past Medical History  Diagnosis Date  . Diabetes mellitus   . Arthritis   . Coronary artery disease   . Cataract   . Acid reflux   . Depression   . Stroke   . GERD (gastroesophageal reflux disease)   . Retinopathy   . H/O cardiac catheterization    Past Surgical History  Procedure Laterality Date  . Abdominal hysterectomy    . Cataract surgery     History   Social History  . Marital Status: Widowed    Spouse Name: N/A  . Number of Children: N/A  . Years of Education: N/A   Social History Main Topics  . Smoking status: Never Smoker   . Smokeless tobacco: Not on file  . Alcohol Use: Yes  . Drug Use: Not on file  . Sexual Activity: Not on file   Other Topics Concern  . None   Social History Narrative   Family History  Problem Relation Age of Onset  . Liver disease Sister   . Colon cancer Brother    Allergies  Allergen Reactions  . Diclofenac Sodium Swelling and Other (See Comments)    Blurred vision  . Doxycycline Calcium Swelling and Other (See Comments)    Blurred vision  . Codeine Nausea And Vomiting and Other (See Comments)    Dizziness   . Zocor [Simvastatin] Other (See Comments)    Dizziness, weakness    Prior to Admission medications   Medication Sig Start Date End Date Taking? Authorizing Provider  ARIPiprazole (ABILIFY) 2 MG tablet Take 2 mg by mouth at  bedtime.    Yes Historical Provider, MD  aspirin 325 MG EC tablet Take 325 mg by mouth daily.   Yes Historical Provider, MD  cholecalciferol (VITAMIN D) 1000 UNITS tablet Take 2,000 Units by mouth daily.   Yes Historical Provider, MD  docusate sodium (COLACE) 100 MG capsule Take 100 mg by mouth 2 (two) times daily.   Yes Historical Provider, MD  Insulin Human (INSULIN PUMP) SOLN Inject 1 each into the skin continuous. HUMALOG   Yes Historical Provider, MD  Multiple Vitamin (MULITIVITAMIN WITH MINERALS) TABS Take 1 tablet by mouth daily.   Yes Historical Provider, MD  omeprazole (PRILOSEC) 20 MG capsule Take 40 mg by mouth daily.   Yes Historical Provider, MD  rosuvastatin (CRESTOR) 5 MG tablet Take 5 mg by mouth daily.   Yes Historical Provider, MD  sertraline (ZOLOFT) 100 MG tablet Take 150 mg by mouth daily.   Yes Historical Provider, MD  insulin aspart (NOVOLOG) 100 UNIT/ML injection Inject 0-6 Units into the skin 3 (three) times daily with meals. 121-150=0 units 151-200=1 unit 201-250=2 units  251-300=3 units 301-350=4 units 351-400=6 units >400 give 6 units and call MD 12/15/14   Starleen Arms, MD  insulin glargine (LANTUS) 100 UNIT/ML injection Inject 0.04 mLs (4 Units total) into  the skin daily. 12/16/14   Leana Roe Elgergawy, MD  insulin lispro (HUMALOG) 100 UNIT/ML injection Inject into the skin 3 (three) times daily before meals.    Historical Provider, MD   Dg Ankle 2 Views Left  12/23/2014   CLINICAL DATA:  Post reduction LEFT ankle  EXAM: LEFT ANKLE - 2 VIEW  COMPARISON:  12/23/2014  FINDINGS: Fiberglass splint material obscures bone detail.  Marked osseous demineralization.  Persistent significantly displaced and angulated bimalleolar fractures.  Persistent lateral tibiotalar dislocation, question impacted upon lateral margin of distal tibia.  No new bony abnormalities identified.  Scattered atherosclerotic calcification.  IMPRESSION: Displaced and angulated bimalleolar fractures  little changed.  Persistent lateral tibiotalar dislocation as above.   Electronically Signed   By: Ulyses Southward M.D.   On: 12/23/2014 13:51   Dg Ankle Complete Left  12/23/2014   CLINICAL DATA:  Larey Seat last night walking to the restroom. Pain and deformity.  EXAM: LEFT ANKLE COMPLETE - 3+ VIEW  COMPARISON:  None.  FINDINGS: Fracture dislocation at the ankle joint with an oblique fracture of the distal fibula and transverse fracture of the medial malleolus. Posterior lip of the tibia appears intact. Dislocation at the talar dome relative to the distal tibial articular surface with talar dome being laterally position and possibly impacted upon the lateral margin of the tibia.  IMPRESSION: Fracture dislocation of the ankle joint as described above.   Electronically Signed   By: Paulina Fusi M.D.   On: 12/23/2014 11:05   Dg Ankle Left Port  12/23/2014   CLINICAL DATA:  Postreduction radiographs are in close reduction of left ankle fractures.  EXAM: PORTABLE LEFT ANKLE - 2 VIEW  COMPARISON:  12/23/2014 at 1327 hours and 1059 hours  FINDINGS: Improved fracture reduction since the earlier postreduction study.  The talus is now all partly reduced. It remains subluxed laterally in relation to the distal tibial articular surface, by approximately 7 mm. Fractures of the medial malleolus and distal fibula are displaced laterally by a similar degree.  IMPRESSION: Improved reduction of the bimalleolar ankle fracture. The displaced talus has been reduced, but remains subluxed laterally in relation to the distal tibia by 7 mm.   Electronically Signed   By: Amie Portland M.D.   On: 12/23/2014 15:00    Positive ROS: All other systems have been reviewed and were otherwise negative with the exception of those mentioned in the HPI and as above.  Physical Exam: General: Alert, no acute distress Cardiovascular: No pedal edema Respiratory: No cyanosis, no use of accessory musculature GI: No organomegaly, abdomen is soft and  non-tender Skin: No lesions in the area of chief complaint Neurologic: Sensation intact distally Psychiatric: Patient is competent for consent with normal mood and affect Lymphatic: No axillary or cervical lymphadenopathy  MUSCULOSKELETAL:  - superficial abrasion over medial ankle - no exposed bone - foot wwp, nvi - 2+ pulses - severe pain with manipulation of ankle   Assessment: Left ankle fx dislocation, unstable  Plan: - ER had difficulty reducing ankle - ortho re-reduced and splinted, postop xrays are adequate - patient's BG 377, poorly controlled DM - patient has poor support at nursing facility - recommend inpatient admission and ORIF sooner than later - patient understands that the medial skin may breakdown and lead to osteo which may lead to amputation with her uncontrolled DM - ORIF planned for Wednesday - bedrest and elevation at all times  Thank you for the consult and the opportunity to see Ms.  Payne  N. Glee ArvinMichael Xu, MD [facility] [phone] 4:08 PM

## 2014-12-23 NOTE — Sedation Documentation (Signed)
MD and ortho tech at bedside re applying cast to pt left ankle.

## 2014-12-23 NOTE — Sedation Documentation (Addendum)
Ortho at bedside applying posterior splint.

## 2014-12-23 NOTE — ED Notes (Signed)
EKG completed, will export when ordered. UA sample at bedside

## 2014-12-23 NOTE — ED Notes (Signed)
Per EMS [facility], pt went to restroom last night and fell, fall not witnessed, pt states she did lose consciousness. Obvious deformity to left ankle. Pedal pulses present. VSS. A/o

## 2014-12-24 ENCOUNTER — Inpatient Hospital Stay (HOSPITAL_COMMUNITY): Payer: Medicare Other

## 2014-12-24 DIAGNOSIS — S82852A Displaced trimalleolar fracture of left lower leg, initial encounter for closed fracture: Secondary | ICD-10-CM | POA: Diagnosis not present

## 2014-12-24 LAB — CBC
HEMATOCRIT: 26.8 % — AB (ref 36.0–46.0)
Hemoglobin: 8.7 g/dL — ABNORMAL LOW (ref 12.0–15.0)
MCH: 27.3 pg (ref 26.0–34.0)
MCHC: 32.5 g/dL (ref 30.0–36.0)
MCV: 84 fL (ref 78.0–100.0)
PLATELETS: 280 10*3/uL (ref 150–400)
RBC: 3.19 MIL/uL — AB (ref 3.87–5.11)
RDW: 15.1 % (ref 11.5–15.5)
WBC: 6.6 10*3/uL (ref 4.0–10.5)

## 2014-12-24 LAB — GLUCOSE, CAPILLARY
GLUCOSE-CAPILLARY: 176 mg/dL — AB (ref 65–99)
Glucose-Capillary: 107 mg/dL — ABNORMAL HIGH (ref 65–99)
Glucose-Capillary: 124 mg/dL — ABNORMAL HIGH (ref 65–99)
Glucose-Capillary: 136 mg/dL — ABNORMAL HIGH (ref 65–99)
Glucose-Capillary: 162 mg/dL — ABNORMAL HIGH (ref 65–99)
Glucose-Capillary: 162 mg/dL — ABNORMAL HIGH (ref 65–99)
Glucose-Capillary: 182 mg/dL — ABNORMAL HIGH (ref 65–99)

## 2014-12-24 LAB — URINE CULTURE: Special Requests: NORMAL

## 2014-12-24 LAB — BASIC METABOLIC PANEL
Anion gap: 10 (ref 5–15)
BUN: 7 mg/dL (ref 6–20)
CO2: 22 mmol/L (ref 22–32)
Calcium: 9 mg/dL (ref 8.9–10.3)
Chloride: 102 mmol/L (ref 101–111)
Creatinine, Ser: 0.93 mg/dL (ref 0.44–1.00)
GFR, EST NON AFRICAN AMERICAN: 60 mL/min — AB (ref >60)
Glucose, Bld: 173 mg/dL — ABNORMAL HIGH (ref 65–99)
Potassium: 3.5 mmol/L (ref 3.5–5.1)
SODIUM: 134 mmol/L — AB (ref 135–145)

## 2014-12-24 LAB — PREPARE RBC (CROSSMATCH)

## 2014-12-24 MED ORDER — SODIUM CHLORIDE 0.9 % IV SOLN: Freq: Once | INTRAVENOUS | Status: AC

## 2014-12-24 MED ORDER — FENTANYL CITRATE (PF) 100 MCG/2ML IJ SOLN
12.5000 ug | INTRAMUSCULAR | Status: DC | PRN
  Administered 2014-12-25: 50 ug via INTRAVENOUS
  Administered 2014-12-25: 100 ug via INTRAVENOUS
  Administered 2014-12-26: 12.5 ug via INTRAVENOUS
  Filled 2014-12-24: qty 2

## 2014-12-24 MED ORDER — HYDROCODONE-ACETAMINOPHEN 5-325 MG PO TABS
1.0000 | ORAL_TABLET | Freq: Four times a day (QID) | ORAL | Status: DC | PRN
  Administered 2014-12-24 – 2014-12-27 (×5): 1 via ORAL
  Filled 2014-12-24 (×6): qty 1

## 2014-12-24 MED ORDER — INSULIN ASPART 100 UNIT/ML ~~LOC~~ SOLN
0.0000 [IU] | Freq: Three times a day (TID) | SUBCUTANEOUS | Status: DC
  Administered 2014-12-24 (×3): 2 [IU] via SUBCUTANEOUS
  Administered 2014-12-25: 3 [IU] via SUBCUTANEOUS
  Administered 2014-12-25: 1 [IU] via SUBCUTANEOUS
  Administered 2014-12-26: 5 [IU] via SUBCUTANEOUS
  Administered 2014-12-26 (×2): 2 [IU] via SUBCUTANEOUS
  Administered 2014-12-27: 3 [IU] via SUBCUTANEOUS

## 2014-12-24 MED ORDER — INSULIN ASPART 100 UNIT/ML ~~LOC~~ SOLN
0.0000 [IU] | Freq: Every day | SUBCUTANEOUS | Status: DC
  Administered 2014-12-25: 2 [IU] via SUBCUTANEOUS

## 2014-12-24 MED ORDER — CEFAZOLIN SODIUM-DEXTROSE 2-3 GM-% IV SOLR
2.0000 g | INTRAVENOUS | Status: AC
  Administered 2014-12-25: 2 g via INTRAVENOUS
  Filled 2014-12-24: qty 50

## 2014-12-24 MED ORDER — INSULIN GLARGINE 100 UNIT/ML ~~LOC~~ SOLN: 5.0000 [IU] | Freq: Every day | SUBCUTANEOUS | Status: DC

## 2014-12-24 MED ORDER — SODIUM CHLORIDE 0.9 % IV SOLN
INTRAVENOUS | Status: DC
  Administered 2014-12-25: 13:00:00 via INTRAVENOUS

## 2014-12-24 MED ORDER — INSULIN GLARGINE 100 UNIT/ML ~~LOC~~ SOLN
5.0000 [IU] | Freq: Every day | SUBCUTANEOUS | Status: DC
  Administered 2014-12-24: 5 [IU] via SUBCUTANEOUS
  Filled 2014-12-24: qty 0.05

## 2014-12-24 MED ORDER — INSULIN GLARGINE 100 UNIT/ML ~~LOC~~ SOLN
5.0000 [IU] | Freq: Every day | SUBCUTANEOUS | Status: DC
  Administered 2014-12-24 – 2014-12-25 (×2): 5 [IU] via SUBCUTANEOUS
  Filled 2014-12-24 (×3): qty 0.05

## 2014-12-24 MED ORDER — CEFAZOLIN SODIUM-DEXTROSE 2-3 GM-% IV SOLR: 2.0000 g | INTRAVENOUS | Status: DC

## 2014-12-24 NOTE — Progress Notes (Signed)
Patient evaluated at bedside.  She is comfortable.  Toes wwp.  Splint is well fitting.  Discussed the surgery in detail for tomorrow and she understands.  Type and screen ordered for Hg 8.4.  Mayra ReelN. Michael Yanely Mast, MD [facility] [phone] 3:22 PM

## 2014-12-24 NOTE — Care Management Note (Signed)
Case Management Note  Patient Details  Name: Beth Payne MRN: [id number] Date of Birth: 07/31/1940  Subjective/Objective:                    Action/Plan: From [facility] . SW aware  And following   Expected Discharge Date:                  Expected Discharge Plan:  Skilled Nursing Facility  In-House Referral:  Clinical Social Work  Discharge planning Services     Post Acute Care Choice:    Choice offered to:     DME Arranged:    DME Agency:     HH Arranged:    HH Agency:     Status of Service:  In process, will continue to follow  Medicare Important Message Given:    Date Medicare IM Given:    Medicare IM give by:    Date Additional Medicare IM Given:    Additional Medicare Important Message give by:     If discussed at Long Length of Stay Meetings, dates discussed:    Additional Comments:  Beth Payne, Beth Kamath [name], RN 12/24/2014, 7:53 AM

## 2014-12-24 NOTE — Progress Notes (Signed)
Inpatient Diabetes Program Recommendations  AACE/ADA: New Consensus Statement on Inpatient Glycemic Control (2013)  Target Ranges:  Prepandial:   less than 140 mg/dL      Peak postprandial:   less than 180 mg/dL (1-2 hours)      Critically ill patients:  140 - 180 mg/dL    Results for Theron AristaGANT, Laruth C (MRN [id number]) as of 12/24/2014 12:05  Ref. Range 12/23/2014 22:53 12/23/2014 23:25 12/24/2014 00:25 12/24/2014 01:27 12/24/2014 02:26  Glucose-Capillary Latest Ref Range: 65-99 mg/dL 409158 (H) 811137 (H) 914107 (H) 124 (H) 136 (H)    Admit with: L Ankle Fracture  History: DM, CAD  SNF DM Meds: Lantus 4 units daily     Novolog SSI TID  Current DM Orders: Lantus 5 units daily            Novolog Sensitive SSI (0-9 units) TID AC + HS    MD- Note that patient received 5 units Lantus this morning at 3AM.  Patient got another 5 units Lantus at 9AM this morning as well.  Called RN and alerted her to this information.  Asked RN to please be vigilant for s/sxs of Hypoglycemia with this patient as patient has issues with Hypoglycemia during her last admission.    Will follow Ambrose FinlandJeannine Johnston Ambra Haverstick RN, MSN, CDE Diabetes Coordinator Inpatient Glycemic Control Team Team Pager: [phone] (8a-5p)

## 2014-12-24 NOTE — Progress Notes (Signed)
PROGRESS NOTE  Beth Payne ZOX:[id number] DOB: 1940-11-01 DOA: 12/23/2014 PCP: [name], MD  Assessment/Plan: Left Ankle fx: reduced in the ER, plan for ORIF on Wednesday by Dr. Roda Shutters - pain control as needed -elevate -bed rest  DM- type 1; poorly controlled- recently in hospital for DKA-  -5 units of lantus; SSI- watch for hypoglycemia -had insulin pump but there was questions as to if the pump was working (Dr. Evlyn Kanner)  CAD- no chest pain with activity EF in 7/16 was 55-60% Low risk stress test- 7/16  Anemia -monitor Hgb with surgery  Code Status: full Family Communication: patient Disposition Plan:    Consultants:    Procedures:    HPI/Subjective: No pain if not moving  Objective: Filed Vitals:   12/24/14 0635  BP: 129/52  Pulse: 74  Temp: 98.4 F (36.9 C)  Resp: 16    Intake/Output Summary (Last 24 hours) at 12/24/14 1147 Last data filed at 12/23/14 1800  Gross per 24 hour  Intake   1120 ml  Output      0 ml  Net   1120 ml   Filed Weights   12/23/14 0954  Weight: 50.349 kg (111 lb)    Exam:   General:  Pleasant/cooperative  Cardiovascular: rrr  Respiratory: clear  Abdomen: +BS, soft  Musculoskeletal: left leg in brace   Data Reviewed: Basic Metabolic Panel:  Recent Labs Lab 12/23/14 1042 12/24/14 0335  NA 137 134*  K 3.7 3.5  CL 98* 102  CO2 24 22  GLUCOSE 377* 173*  BUN <5* 7  CREATININE 0.83 0.93  CALCIUM 9.6 9.0   Liver Function Tests: No results for input(s): AST, ALT, ALKPHOS, BILITOT, PROT, ALBUMIN in the last 168 hours. No results for input(s): LIPASE, AMYLASE in the last 168 hours. No results for input(s): AMMONIA in the last 168 hours. CBC:  Recent Labs Lab 12/23/14 1042 12/24/14 0335  WBC 7.8 6.6  NEUTROABS 6.2  --   HGB 10.6* 8.7*  HCT 32.2* 26.8*  MCV 83.6 84.0  PLT 304 280   Cardiac Enzymes: No results for input(s): CKTOTAL, CKMB, CKMBINDEX, TROPONINI in the last 168 hours. BNP (last 3  results) No results for input(s): BNP in the last 8760 hours.  ProBNP (last 3 results) No results for input(s): PROBNP in the last 8760 hours.  CBG:  Recent Labs Lab 12/23/14 2253 12/23/14 2325 12/24/14 0025 12/24/14 0127 12/24/14 0226  GLUCAP 158* 137* 107* 124* 136*    Recent Results (from the past 240 hour(s))  Urine culture     Status: None   Collection Time: 12/23/14 10:33 AM  Result Value Ref Range Status   Specimen Description URINE, CLEAN CATCH  Final   Special Requests Normal  Final   Culture   Final    MULTIPLE SPECIES PRESENT, SUGGEST RECOLLECTION IF CLINICALLY INDICATED   Report Status 12/24/2014 FINAL  Final     Studies: Dg Ankle 2 Views Left  12/23/2014   CLINICAL DATA:  Post reduction LEFT ankle  EXAM: LEFT ANKLE - 2 VIEW  COMPARISON:  12/23/2014  FINDINGS: Fiberglass splint material obscures bone detail.  Marked osseous demineralization.  Persistent significantly displaced and angulated bimalleolar fractures.  Persistent lateral tibiotalar dislocation, question impacted upon lateral margin of distal tibia.  No new bony abnormalities identified.  Scattered atherosclerotic calcification.  IMPRESSION: Displaced and angulated bimalleolar fractures little changed.  Persistent lateral tibiotalar dislocation as above.   Electronically Signed   By: Angelyn Punt.D.  On: 12/23/2014 13:51   Dg Ankle Complete Left  12/23/2014   CLINICAL DATA:  Larey Seat last night walking to the restroom. Pain and deformity.  EXAM: LEFT ANKLE COMPLETE - 3+ VIEW  COMPARISON:  None.  FINDINGS: Fracture dislocation at the ankle joint with an oblique fracture of the distal fibula and transverse fracture of the medial malleolus. Posterior lip of the tibia appears intact. Dislocation at the talar dome relative to the distal tibial articular surface with talar dome being laterally position and possibly impacted upon the lateral margin of the tibia.  IMPRESSION: Fracture dislocation of the ankle joint as  described above.   Electronically Signed   By: Paulina Fusi M.D.   On: 12/23/2014 11:05   Ct Ankle Left Wo Contrast  12/24/2014   CLINICAL DATA:  The patient caught her left foot in a wheelchair 12/21/2014 resulting and sustained a left ankle fracture. Pain. Initial encounter.  EXAM: CT OF THE LEFT ANKLE WITHOUT CONTRAST  TECHNIQUE: Multidetector CT imaging of the left ankle was performed according to the standard protocol. Multiplanar CT image reconstructions were also generated.  COMPARISON:  Plain films 12/23/2014.  FINDINGS: The patient has a fracture of the distal fibula originating in the lateral cortex 4.3 cm above the tip of the lateral malleolus. The fracture extends in an oblique and medial orientation through the distal diaphysis. There is approximately 1/2 shaft with lateral displacement.  A medial malleolar fracture is identified with approximately 0.5 cm lateral displacement. The patient also has a fracture of the posterior malleolus which demonstrates cephalad displacement of approximately 0.3 cm. This fracture fragment measures approximately 1.3 cm transverse by 0.6 cm AP at the plafond by 1 cm craniocaudal and is eccentric laterally. No other fracture is identified. Bones are osteopenic. 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  Soft tissue swelling and hematoma are seen about the ankle. No tendon entrapment is identified. All visualized tendons appear intact. No ligament tear is visualized by CT scan.  IMPRESSION: Trimalleolar fractures as described above.  Osteopenia.  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   Electronically Signed   By: Drusilla Kanner M.D.   On: 12/24/2014 08:22   Dg Ankle Left Port  12/23/2014   CLINICAL DATA:  Post reduction  EXAM: PORTABLE LEFT ANKLE - 2 VIEW  COMPARISON:  Portable exam 1613 hours compared to earlier studies of 12/23/2014  FINDINGS: Osseous demineralization.  Previously identified lateral talar subluxation appear significantly improved.   Mildly displaced lateral malleolar fracture little changed.  Question mild medial displacement of media malleolar fragment versus artifact from superimposed plaster splint.  IMPRESSION: Improved tibiotalar alignment post reduction.  Mildly displaced bimalleolar fractures as above.   Electronically Signed   By: Ulyses Southward M.D.   On: 12/23/2014 16:23   Dg Ankle Left Port  12/23/2014   CLINICAL DATA:  Postreduction radiographs are in close reduction of left ankle fractures.  EXAM: PORTABLE LEFT ANKLE - 2 VIEW  COMPARISON:  12/23/2014 at 1327 hours and 1059 hours  FINDINGS: Improved fracture reduction since the earlier postreduction study.  The talus is now all partly reduced. It remains subluxed laterally in relation to the distal tibial articular surface, by approximately 7 mm. Fractures of the medial malleolus and distal fibula are displaced laterally by a similar degree.  IMPRESSION: Improved reduction of the bimalleolar ankle fracture. The displaced talus has been reduced, but remains subluxed laterally in relation to the distal tibia by 7 mm.   Electronically Signed   By: Onalee Hua  Ormond M.D.   On: 12/23/2014 15:00    Scheduled Meds: . ARIPiprazole  2 mg Oral QHS  . cholecalciferol  2,000 Units Oral Daily  . docusate sodium  100 mg Oral BID  . insulin aspart  0-5 Units Subcutaneous QHS  . insulin aspart  0-9 Units Subcutaneous TID WC  . insulin glargine  5 Units Subcutaneous Daily  . multivitamin with minerals  1 tablet Oral Daily  . pantoprazole  40 mg Oral Daily  . pneumococcal 23 valent vaccine  0.5 mL Intramuscular Tomorrow-1000  . rosuvastatin  5 mg Oral Daily  . sertraline  150 mg Oral Daily   Continuous Infusions: . sodium chloride 50 mL/hr at 12/24/14 0340   Antibiotics Given (last 72 hours)    None      Active Problems:   CAD s/p stent to mid-RCA in 2001.   DM (diabetes mellitus), type 1 with complications   Fracture, ankle    Time spent: 25 min    Oral Remache,  Mckynlee Luse  [facility] Pager [phone]. If 7PM-7AM, please contact night-coverage at [email or web address], password [email or web address] 12/24/2014, 11:47 AM  LOS: 1 day

## 2014-12-25 ENCOUNTER — Inpatient Hospital Stay (HOSPITAL_COMMUNITY): Payer: Medicare Other

## 2014-12-25 ENCOUNTER — Encounter (HOSPITAL_COMMUNITY)
Admission: EM | Disposition: A | Discharge status: Skilled Nursing Facility | Payer: Self-pay | Source: Home / Self Care | Attending: Internal Medicine

## 2014-12-25 ENCOUNTER — Inpatient Hospital Stay (HOSPITAL_COMMUNITY): Payer: Medicare Other | Admitting: Anesthesiology

## 2014-12-25 HISTORY — PX: ORIF ANKLE FRACTURE: SHX5408

## 2014-12-25 LAB — GLUCOSE, CAPILLARY
GLUCOSE-CAPILLARY: 148 mg/dL — AB (ref 65–99)
GLUCOSE-CAPILLARY: 231 mg/dL — AB (ref 65–99)
Glucose-Capillary: 205 mg/dL — ABNORMAL HIGH (ref 65–99)
Glucose-Capillary: 115 mg/dL — ABNORMAL HIGH (ref 65–99)

## 2014-12-25 SURGERY — OPEN REDUCTION INTERNAL FIXATION (ORIF) ANKLE FRACTURE
Anesthesia: Regional | Site: Ankle | Laterality: Left

## 2014-12-25 MED ORDER — MIDAZOLAM HCL 2 MG/2ML IJ SOLN
1.0000 mg | INTRAMUSCULAR | Status: DC | PRN
  Administered 2014-12-25: 2 mg via INTRAVENOUS

## 2014-12-25 MED ORDER — PROPOFOL 10 MG/ML IV BOLUS
INTRAVENOUS | Status: AC
  Filled 2014-12-25: qty 20

## 2014-12-25 MED ORDER — FENTANYL CITRATE (PF) 100 MCG/2ML IJ SOLN
50.0000 ug | INTRAMUSCULAR | Status: DC | PRN
  Administered 2014-12-25: 50 ug via INTRAVENOUS

## 2014-12-25 MED ORDER — PHENYLEPHRINE 40 MCG/ML (10ML) SYRINGE FOR IV PUSH (FOR BLOOD PRESSURE SUPPORT)
PREFILLED_SYRINGE | INTRAVENOUS | Status: AC
  Filled 2014-12-25: qty 10

## 2014-12-25 MED ORDER — ONDANSETRON HCL 4 MG/2ML IJ SOLN
INTRAMUSCULAR | Status: DC | PRN
  Administered 2014-12-25: 4 mg via INTRAVENOUS

## 2014-12-25 MED ORDER — 0.9 % SODIUM CHLORIDE (POUR BTL) OPTIME
TOPICAL | Status: DC | PRN
  Administered 2014-12-25: 1000 mL

## 2014-12-25 MED ORDER — MIDAZOLAM HCL 2 MG/2ML IJ SOLN
INTRAMUSCULAR | Status: AC
Start: 2014-12-25 — End: 2014-12-25
  Administered 2014-12-25: 2 mg via INTRAVENOUS
  Filled 2014-12-25: qty 2

## 2014-12-25 MED ORDER — PROPOFOL 10 MG/ML IV BOLUS
INTRAVENOUS | Status: DC | PRN
  Administered 2014-12-25 (×2): 100 mg via INTRAVENOUS
  Administered 2014-12-25: 30 mg via INTRAVENOUS

## 2014-12-25 MED ORDER — MEPERIDINE HCL 25 MG/ML IJ SOLN: 6.2500 mg | INTRAMUSCULAR | Status: DC | PRN

## 2014-12-25 MED ORDER — ROCURONIUM BROMIDE 50 MG/5ML IV SOLN
INTRAVENOUS | Status: AC
  Filled 2014-12-25: qty 1

## 2014-12-25 MED ORDER — PROMETHAZINE HCL 25 MG/ML IJ SOLN: 6.2500 mg | INTRAMUSCULAR | Status: DC | PRN

## 2014-12-25 MED ORDER — LACTATED RINGERS IV SOLN
INTRAVENOUS | Status: DC
  Administered 2014-12-25 (×2): via INTRAVENOUS

## 2014-12-25 MED ORDER — SUCCINYLCHOLINE CHLORIDE 20 MG/ML IJ SOLN
INTRAMUSCULAR | Status: DC | PRN
  Administered 2014-12-25: 120 mg via INTRAVENOUS

## 2014-12-25 MED ORDER — FENTANYL CITRATE (PF) 250 MCG/5ML IJ SOLN
INTRAMUSCULAR | Status: AC
  Filled 2014-12-25: qty 5

## 2014-12-25 MED ORDER — ASPIRIN EC 325 MG PO TBEC
325.0000 mg | DELAYED_RELEASE_TABLET | Freq: Two times a day (BID) | ORAL | Status: DC
  Administered 2014-12-25 – 2014-12-27 (×4): 325 mg via ORAL
  Filled 2014-12-25 (×4): qty 1

## 2014-12-25 MED ORDER — PHENYLEPHRINE HCL 10 MG/ML IJ SOLN
10.0000 mg | INTRAMUSCULAR | Status: DC | PRN
  Administered 2014-12-25: 50 ug/min via INTRAVENOUS

## 2014-12-25 MED ORDER — BUPIVACAINE-EPINEPHRINE (PF) 0.5% -1:200000 IJ SOLN
INTRAMUSCULAR | Status: DC | PRN
  Administered 2014-12-25: 30 mL via PERINEURAL

## 2014-12-25 MED ORDER — FENTANYL CITRATE (PF) 100 MCG/2ML IJ SOLN
INTRAMUSCULAR | Status: AC
Start: 2014-12-25 — End: 2014-12-25
  Administered 2014-12-25: 50 ug via INTRAVENOUS
  Filled 2014-12-25: qty 2

## 2014-12-25 MED ORDER — ONDANSETRON HCL 4 MG/2ML IJ SOLN: 4.0000 mg | Freq: Once | INTRAMUSCULAR | Status: DC | PRN

## 2014-12-25 MED ORDER — SUCCINYLCHOLINE CHLORIDE 20 MG/ML IJ SOLN
INTRAMUSCULAR | Status: AC
  Filled 2014-12-25: qty 1

## 2014-12-25 MED ORDER — HYDROCODONE-ACETAMINOPHEN 5-325 MG PO TABS: 1.0000 | ORAL_TABLET | Freq: Four times a day (QID) | ORAL | Status: DC | PRN

## 2014-12-25 MED ORDER — LIDOCAINE-EPINEPHRINE (PF) 1.5 %-1:200000 IJ SOLN
INTRAMUSCULAR | Status: DC | PRN
  Administered 2014-12-25: 30 mL via PERINEURAL

## 2014-12-25 MED ORDER — HYDROMORPHONE HCL 1 MG/ML IJ SOLN: 0.2500 mg | INTRAMUSCULAR | Status: DC | PRN

## 2014-12-25 SURGICAL SUPPLY — 70 items
BANDAGE ELASTIC 4 VELCRO ST LF (GAUZE/BANDAGES/DRESSINGS) ×2 IMPLANT
BANDAGE ELASTIC 6 VELCRO ST LF (GAUZE/BANDAGES/DRESSINGS) ×2 IMPLANT
BANDAGE ESMARK 6X9 LF (GAUZE/BANDAGES/DRESSINGS) IMPLANT
BIT DRILL CANN 2.7 (BIT) ×2
BIT DRILL CANN 2.7MM (BIT) ×1
BIT DRILL SRG 2.7XCANN AO CPLG (BIT) IMPLANT
BIT DRL SRG 2.7XCANN AO CPLNG (BIT) ×1
BLADE SURG 15 STRL LF DISP TIS (BLADE) ×1 IMPLANT
BLADE SURG 15 STRL SS (BLADE) ×3
BNDG CMPR 9X6 STRL LF SNTH (GAUZE/BANDAGES/DRESSINGS) ×1
BNDG COHESIVE 4X5 TAN STRL (GAUZE/BANDAGES/DRESSINGS) ×3 IMPLANT
BNDG COHESIVE 6X5 TAN STRL LF (GAUZE/BANDAGES/DRESSINGS) IMPLANT
BNDG ESMARK 6X9 LF (GAUZE/BANDAGES/DRESSINGS) ×3
CANISTER SUCT 3000ML PPV (MISCELLANEOUS) ×3 IMPLANT
COVER SURGICAL LIGHT HANDLE (MISCELLANEOUS) ×3 IMPLANT
CUFF TOURNIQUET SINGLE 34IN LL (TOURNIQUET CUFF) ×2 IMPLANT
CUFF TOURNIQUET SINGLE 44IN (TOURNIQUET CUFF) IMPLANT
DRAPE C-ARM 42X72 X-RAY (DRAPES) ×3 IMPLANT
DRAPE C-ARMOR (DRAPES) ×3 IMPLANT
DRAPE IMP U-DRAPE 54X76 (DRAPES) ×3 IMPLANT
DRAPE INCISE IOBAN 66X45 STRL (DRAPES) ×2 IMPLANT
DRAPE U-SHAPE 47X51 STRL (DRAPES) ×3 IMPLANT
DRILL 2.6X122MM WL AO SHAFT (BIT) ×2 IMPLANT
DRSG PAD ABDOMINAL 8X10 ST (GAUZE/BANDAGES/DRESSINGS) ×2 IMPLANT
DURAPREP 26ML APPLICATOR (WOUND CARE) ×3 IMPLANT
ELECT CAUTERY BLADE 6.4 (BLADE) ×3 IMPLANT
ELECT REM PT RETURN 9FT ADLT (ELECTROSURGICAL) ×3
ELECTRODE REM PT RTRN 9FT ADLT (ELECTROSURGICAL) ×1 IMPLANT
FACESHIELD WRAPAROUND (MASK) ×3 IMPLANT
FACESHIELD WRAPAROUND OR TEAM (MASK) ×1 IMPLANT
GAUZE SPONGE 4X4 12PLY STRL (GAUZE/BANDAGES/DRESSINGS) ×3 IMPLANT
GAUZE XEROFORM 5X9 LF (GAUZE/BANDAGES/DRESSINGS) ×3 IMPLANT
GLOVE NEODERM STRL 7.5 LF PF (GLOVE) ×1 IMPLANT
GLOVE SURG NEODERM 7.5  LF PF (GLOVE) ×2
GLOVE SURG SYN 7.5  E (GLOVE) ×4
GLOVE SURG SYN 7.5 E (GLOVE) ×2 IMPLANT
GLOVE SURG SYN 7.5 PF PI (GLOVE) ×2 IMPLANT
GOWN STRL REIN XL XLG (GOWN DISPOSABLE) ×3 IMPLANT
K-WIRE ORTHOPEDIC 1.4X150L (WIRE) ×6
KIT BASIN OR (CUSTOM PROCEDURE TRAY) ×3 IMPLANT
KIT ROOM TURNOVER OR (KITS) ×3 IMPLANT
KWIRE ORTHOPEDIC 1.4X150L (WIRE) ×2 IMPLANT
NDL HYPO 25GX1X1/2 BEV (NEEDLE) IMPLANT
NEEDLE HYPO 25GX1X1/2 BEV (NEEDLE) ×3 IMPLANT
NS IRRIG 1000ML POUR BTL (IV SOLUTION) ×3 IMPLANT
PACK ORTHO EXTREMITY (CUSTOM PROCEDURE TRAY) ×3 IMPLANT
PAD ARMBOARD 7.5X6 YLW CONV (MISCELLANEOUS) ×6 IMPLANT
PAD CAST 3X4 CTTN HI CHSV (CAST SUPPLIES) ×2 IMPLANT
PADDING CAST COTTON 3X4 STRL (CAST SUPPLIES) ×3
PADDING CAST COTTON 6X4 STRL (CAST SUPPLIES) ×1 IMPLANT
PLATE FIBULA 4H (Plate) ×2 IMPLANT
SCREW 3.5X10MM (Screw) ×2 IMPLANT
SCREW BONE 18 (Screw) ×2 IMPLANT
SCREW BONE 3.5X20MM (Screw) ×2 IMPLANT
SCREW BONE NON-LCKING 3.5X12MM (Screw) ×2 IMPLANT
SCREW CANNULATED 4.0X36MM PT (Screw) ×2 IMPLANT
SCREW FULLY THREADED 4.0X38MM (Screw) ×2 IMPLANT
SCREW LOCK 3.5X10MM (Screw) ×2 IMPLANT
SCREW LOCKING 3.5X16MM (Screw) ×6 IMPLANT
SCREW LOCKING 3.5X18MM (Screw) ×3 IMPLANT
SPONGE LAP 18X18 X RAY DECT (DISPOSABLE) IMPLANT
SUCTION FRAZIER TIP 10 FR DISP (SUCTIONS) ×3 IMPLANT
SUT ETHILON 3 0 PS 1 (SUTURE) ×6 IMPLANT
SUT VIC AB 2-0 CT1 27 (SUTURE) ×6
SUT VIC AB 2-0 CT1 TAPERPNT 27 (SUTURE) IMPLANT
SYR CONTROL 10ML LL (SYRINGE) ×2 IMPLANT
TOWEL OR 17X24 6PK STRL BLUE (TOWEL DISPOSABLE) ×3 IMPLANT
TOWEL OR 17X26 10 PK STRL BLUE (TOWEL DISPOSABLE) ×6 IMPLANT
TUBE CONNECTING 12'X1/4 (SUCTIONS) ×1
TUBE CONNECTING 12X1/4 (SUCTIONS) ×2 IMPLANT

## 2014-12-25 NOTE — Progress Notes (Signed)
PROGRESS NOTE  Beth Payne AOZ:[id number] DOB: 1940/06/16 DOA: 12/23/2014 PCP: [name], MD  Assessment/Plan: Left Ankle fx: reduced in the ER, plan for ORIF 7/20 by Dr. Roda Shutters - pain control as needed -elevate -bed rest  DM- type 1; poorly controlled- recently in hospital for DKA-  -5 units of lantus; SSI- watch for hypoglycemia -had insulin pump but there was questions as to if the pump was working (follows with Dr. Evlyn Kanner)  CAD- no chest pain with activity EF in 7/16 was 55-60% Low risk stress test- 7/16  Anemia -monitor Hgb with surgery -type and cross  Code Status: full Family Communication: patient Disposition Plan:    Consultants:    Procedures:    HPI/Subjective: Mouth is dry  Objective: Filed Vitals:   12/25/14 0439  BP: 153/57  Pulse: 76  Temp: 98.5 F (36.9 C)  Resp: 16    Intake/Output Summary (Last 24 hours) at 12/25/14 0844 Last data filed at 12/25/14 0743  Gross per 24 hour  Intake    798 ml  Output   1000 ml  Net   -202 ml   Filed Weights   12/23/14 0954  Weight: 50.349 kg (111 lb)    Exam:   General:  Pleasant/cooperative  Cardiovascular: rrr  Respiratory: clear  Abdomen: +BS, soft  Musculoskeletal: left leg in brace   Data Reviewed: Basic Metabolic Panel:  Recent Labs Lab 12/23/14 1042 12/24/14 0335  NA 137 134*  K 3.7 3.5  CL 98* 102  CO2 24 22  GLUCOSE 377* 173*  BUN <5* 7  CREATININE 0.83 0.93  CALCIUM 9.6 9.0   Liver Function Tests: No results for input(s): AST, ALT, ALKPHOS, BILITOT, PROT, ALBUMIN in the last 168 hours. No results for input(s): LIPASE, AMYLASE in the last 168 hours. No results for input(s): AMMONIA in the last 168 hours. CBC:  Recent Labs Lab 12/23/14 1042 12/24/14 0335  WBC 7.8 6.6  NEUTROABS 6.2  --   HGB 10.6* 8.7*  HCT 32.2* 26.8*  MCV 83.6 84.0  PLT 304 280   Cardiac Enzymes: No results for input(s): CKTOTAL, CKMB, CKMBINDEX, TROPONINI in the last 168  hours. BNP (last 3 results) No results for input(s): BNP in the last 8760 hours.  ProBNP (last 3 results) No results for input(s): PROBNP in the last 8760 hours.  CBG:  Recent Labs Lab 12/24/14 0846 12/24/14 1238 12/24/14 1717 12/24/14 2144 12/25/14 0735  GLUCAP 176* 162* 182* 162* 205*    Recent Results (from the past 240 hour(s))  Urine culture     Status: None   Collection Time: 12/23/14 10:33 AM  Result Value Ref Range Status   Specimen Description URINE, CLEAN CATCH  Final   Special Requests Normal  Final   Culture   Final    MULTIPLE SPECIES PRESENT, SUGGEST RECOLLECTION IF CLINICALLY INDICATED   Report Status 12/24/2014 FINAL  Final     Studies: Dg Ankle 2 Views Left  12/23/2014   CLINICAL DATA:  Post reduction LEFT ankle  EXAM: LEFT ANKLE - 2 VIEW  COMPARISON:  12/23/2014  FINDINGS: Fiberglass splint material obscures bone detail.  Marked osseous demineralization.  Persistent significantly displaced and angulated bimalleolar fractures.  Persistent lateral tibiotalar dislocation, question impacted upon lateral margin of distal tibia.  No new bony abnormalities identified.  Scattered atherosclerotic calcification.  IMPRESSION: Displaced and angulated bimalleolar fractures little changed.  Persistent lateral tibiotalar dislocation as above.   Electronically Signed   By: Angelyn Punt.D.  On: 12/23/2014 13:51   Dg Ankle Complete Left  12/23/2014   CLINICAL DATA:  Larey SeatFell last night walking to the restroom. Pain and deformity.  EXAM: LEFT ANKLE COMPLETE - 3+ VIEW  COMPARISON:  None.  FINDINGS: Fracture dislocation at the ankle joint with an oblique fracture of the distal fibula and transverse fracture of the medial malleolus. Posterior lip of the tibia appears intact. Dislocation at the talar dome relative to the distal tibial articular surface with talar dome being laterally position and possibly impacted upon the lateral margin of the tibia.  IMPRESSION: Fracture dislocation of  the ankle joint as described above.   Electronically Signed   By: Paulina FusiMark  Shogry M.D.   On: 12/23/2014 11:05   Ct Ankle Left Wo Contrast  12/24/2014   CLINICAL DATA:  The patient caught her left foot in a wheelchair 12/21/2014 resulting and sustained a left ankle fracture. Pain. Initial encounter.  EXAM: CT OF THE LEFT ANKLE WITHOUT CONTRAST  TECHNIQUE: Multidetector CT imaging of the left ankle was performed according to the standard protocol. Multiplanar CT image reconstructions were also generated.  COMPARISON:  Plain films 12/23/2014.  FINDINGS: The patient has a fracture of the distal fibula originating in the lateral cortex 4.3 cm above the tip of the lateral malleolus. The fracture extends in an oblique and medial orientation through the distal diaphysis. There is approximately 1/2 shaft with lateral displacement.  A medial malleolar fracture is identified with approximately 0.5 cm lateral displacement. The patient also has a fracture of the posterior malleolus which demonstrates cephalad displacement of approximately 0.3 cm. This fracture fragment measures approximately 1.3 cm transverse by 0.6 cm AP at the plafond by 1 cm craniocaudal and is eccentric laterally. No other fracture is identified. Bones are osteopenic. 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  Soft tissue swelling and hematoma are seen about the ankle. No tendon entrapment is identified. All visualized tendons appear intact. No ligament tear is visualized by CT scan.  IMPRESSION: Trimalleolar fractures as described above.  Osteopenia.  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   Electronically Signed   By: Drusilla Kannerhomas  Dalessio M.D.   On: 12/24/2014 08:22   Dg Ankle Left Port  12/23/2014   CLINICAL DATA:  Post reduction  EXAM: PORTABLE LEFT ANKLE - 2 VIEW  COMPARISON:  Portable exam 1613 hours compared to earlier studies of 12/23/2014  FINDINGS: Osseous demineralization.  Previously identified lateral talar subluxation appear  significantly improved.  Mildly displaced lateral malleolar fracture little changed.  Question mild medial displacement of media malleolar fragment versus artifact from superimposed plaster splint.  IMPRESSION: Improved tibiotalar alignment post reduction.  Mildly displaced bimalleolar fractures as above.   Electronically Signed   By: Ulyses SouthwardMark  Boles M.D.   On: 12/23/2014 16:23   Dg Ankle Left Port  12/23/2014   CLINICAL DATA:  Postreduction radiographs are in close reduction of left ankle fractures.  EXAM: PORTABLE LEFT ANKLE - 2 VIEW  COMPARISON:  12/23/2014 at 1327 hours and 1059 hours  FINDINGS: Improved fracture reduction since the earlier postreduction study.  The talus is now all partly reduced. It remains subluxed laterally in relation to the distal tibial articular surface, by approximately 7 mm. Fractures of the medial malleolus and distal fibula are displaced laterally by a similar degree.  IMPRESSION: Improved reduction of the bimalleolar ankle fracture. The displaced talus has been reduced, but remains subluxed laterally in relation to the distal tibia by 7 mm.   Electronically Signed   By: Onalee Huaavid  Ormond M.D.   On: 12/23/2014 15:00    Scheduled Meds: . ARIPiprazole  2 mg Oral QHS  .  ceFAZolin (ANCEF) IV  2 g Intravenous To OR  . cholecalciferol  2,000 Units Oral Daily  . docusate sodium  100 mg Oral BID  . insulin aspart  0-5 Units Subcutaneous QHS  . insulin aspart  0-9 Units Subcutaneous TID WC  . insulin glargine  5 Units Subcutaneous Daily  . multivitamin with minerals  1 tablet Oral Daily  . pantoprazole  40 mg Oral Daily  . rosuvastatin  5 mg Oral Daily  . sertraline  150 mg Oral Daily   Continuous Infusions: . sodium chloride 50 mL/hr at 12/24/14 0340  . sodium chloride     Antibiotics Given (last 72 hours)    None      Active Problems:   CAD s/p stent to mid-RCA in 2001.   DM (diabetes mellitus), type 1 with complications   Fracture, ankle    Time spent: 25  min    VANN, JESSICA  [facility] Pager [phone]. If 7PM-7AM, please contact night-coverage at [email or web address], password [facility] 12/25/2014, 8:44 AM  LOS: 2 days

## 2014-12-25 NOTE — Transfer of Care (Signed)
Immediate Anesthesia Transfer of Care Note  Patient: Beth Payne  Procedure(s) Performed: Procedure(s): OPEN REDUCTION INTERNAL FIXATION (ORIF) LEFT TRIMALLEOLAR ANKLE FRACTURE (Left)  Patient Location: PACU  Anesthesia Type:GA combined with regional for post-op pain  Level of Consciousness: sedated and patient cooperative  Airway & Oxygen Therapy: Patient Spontanous Breathing and Patient connected to nasal cannula oxygen  Post-op Assessment: Report given to RN, Post -op Vital signs reviewed and stable and Patient moving all extremities  Post vital signs: Reviewed and stable  Last Vitals:  Filed Vitals:   12/25/14 1535  BP: 137/48  Pulse: 81  Temp:   Resp: 20    Complications: No apparent anesthesia complications

## 2014-12-25 NOTE — Anesthesia Procedure Notes (Addendum)
Anesthesia Regional Block:  Popliteal block  Pre-Anesthetic Checklist: ,, timeout performed, Correct Patient, Correct Site, Correct Laterality, Correct Procedure, Correct Position, site marked, Risks and benefits discussed,  Surgical consent,  Pre-op evaluation,  At surgeon's request and post-op pain management  Laterality: Left  Prep: chloraprep       Needles:  Injection technique: Single-shot  Needle Type: Echogenic Stimulator Needle     Needle Length: 10cm 10 cm Needle Gauge: 21 and 21 G    Additional Needles:  Procedures: ultrasound guided (picture in chart) and nerve stimulator Popliteal block  Nerve Stimulator or Paresthesia:  Response: 0.4 mA,   Additional Responses:   Narrative:  Start time: 12/25/2014 3:15 PM End time: 12/25/2014 3:30 PM Injection made incrementally with aspirations every 5 mL.  Performed by: Personally  Anesthesiologist: Arta BruceSSEY, KEVIN  Additional Notes: Monitors applied. Patient sedated. Sterile prep and drape,hand hygiene and sterile gloves were used. Relevant anatomy identified.Needle position confirmed.Local anesthetic injected incrementally after negative aspiration. Local anesthetic spread visualized around nerve(s). Vascular puncture avoided. No complications. Image printed for medical record.The patient tolerated the procedure well.  Additional Saphenous nerve block performed. 15cc Local Anesthetic mixture placed under ultrasonic guidance along the medio-inferior border of the Sartorious muscle 6 inches above the knee.  No Problems encountered.  Arta BruceKevin Ossey MD    Procedure Name: Intubation Date/Time: 12/25/2014 4:00 PM Performed by: Dorie RankQUINN, Kiyani Jernigan M Pre-anesthesia Checklist: Patient identified, Emergency Drugs available, Suction available, Patient being monitored and Timeout performed Patient Re-evaluated:Patient Re-evaluated prior to inductionOxygen Delivery Method: Circle system utilized Preoxygenation: Pre-oxygenation with 100%  oxygen Intubation Type: IV induction Ventilation: Mask ventilation without difficulty Laryngoscope Size: Mac and 3 Grade View: Grade I Tube type: Oral Tube size: 7.5 mm Number of attempts: 1 Placement Confirmation: ETT inserted through vocal cords under direct vision,  positive ETCO2 and breath sounds checked- equal and bilateral Secured at: 22 cm Tube secured with: Tape Comments: Attempted MAC anesthesia, pt did not tolerate.  Attempted #4 LMA insertion with poor placement noted, Dr Michelle Piperssey repositioned, limited air movement noted.  Intubated using MAC 3 blade.  Dr Michelle Piperssey verified placement.  Zigmund GottronH Ginna Schuur, CRNA

## 2014-12-25 NOTE — Care Management Important Message (Signed)
Important Message  Patient Details  Name: Beth Payne MRN: [id number] Date of Birth: 03/25/1941   Medicare Important Message Given:  Yes-second notification given    Orson AloeMegan P Thamas Appleyard 12/25/2014, 1:11 PM

## 2014-12-25 NOTE — Op Note (Signed)
   Date of Surgery: 12/25/2014  INDICATIONS: Beth Payne is a 74 y.o.-year-old female who sustained a left ankle fracture; she was indicated for open reduction and internal fixation due to the displaced nature of the articular fracture and came to the operating room today for this procedure. The patient did consent to the procedure after discussion of the risks and benefits.  PREOPERATIVE DIAGNOSIS: left trimalleolar ankle fracture dislocation  POSTOPERATIVE DIAGNOSIS: Same.  PROCEDURE: Open treatment of left ankle fracture with internal fixation. Trimalleolar w/o fixation of posterior malleolus CPT 27822.   SURGEON: N. Glee ArvinMichael Anona Giovannini, M.D.  ASSIST: Patrick Jupiterarla Bethune, RNFA.  ANESTHESIA:  general, regional  TOURNIQUET TIME: less than 2 hrs  IV FLUIDS AND URINE: See anesthesia.  ESTIMATED BLOOD LOSS: minimal mL.  IMPLANTS: [employer] Variax 4 hole distal fibula plate  COMPLICATIONS: None.  DESCRIPTION OF PROCEDURE: The patient was brought to the operating room and placed supine on the operating table.  The patient had been signed prior to the procedure and this was documented. The patient had the anesthesia placed by the anesthesiologist.  A nonsterile tourniquet was placed on the upper thigh.  The prep verification and incision time-outs were performed to confirm that this was the correct patient, site, side and location. The patient had an SCD on the opposite lower extremity. The patient did receive antibiotics prior to the incision and was re-dosed during the procedure as needed at indicated intervals.  The patient had the lower extremity prepped and draped in the standard surgical fashion.  The extremity was exsanguinated using an esmarch bandage and the tourniquet was inflated to 300 mm Hg.  A laterally-based incision over the distal fibula was used. Full-thickness flaps were created. The fibula was exposed. The fracture was also exposed. Organized hematoma was removed from the fracture site. Of note  the bone was of terrible quality.  We obtained a reduction and placed a anterior to posterior lag screw. This lag screw had except double purchase. We then placed a 4 hole distal fibular plate and confirmed its placement on the fluoroscopy. We then placed a nonlocking screw to contour the plate down to the fibular shaft area once this was done we then placed locking screws through the rest of the plate. We then turned our attention to the medial malleolus fracture. We use a curvilinear incision over the medial malleolus. Care was taken not to insult the traumatized skin anymore. The neurovascular bundle was identified and protected. We then exposed the fracture which had a significant amount of comminution and bone loss. After we were able to obtain an adequate reduction we placed 2 4.0 mm cannulated screws over K wires and held the fracture in place. Final x-rays were taken. The wound was thoroughly irrigated and closed in layer fashion using 2-0 Vicryls and 3-0 nylon. Sterile dressings were applied. The foot was immobilized in a short-leg splint. The patient tolerated the procedure well was extubated and transferred to the PACU in stable condition. All sponge counts were correct.  POSTOPERATIVE PLAN: Beth Payne will remain nonweightbearing on this leg for approximately 6 weeks; Beth Payne will return for a wound check in 1 week.  He will be immobilized in a short leg splint and then transitioned to a CAM walker at his first follow up appointment.  Beth Payne will receive DVT prophylaxis based on other medications, activity level, and risk ratio of bleeding to thrombosis.  Mayra ReelN. Michael Jerelene Salaam, MD [facility] [phone] 5:45 PM

## 2014-12-25 NOTE — H&P (Signed)
H&P update  The surgical history has been reviewed and remains accurate without interval change.  The patient was re-examined and patient's physiologic condition has not changed significantly in the last 30 days. The condition still exists that makes this procedure necessary. The treatment plan remains the same, without new options for care.  No new pharmacological allergies or types of therapy has been initiated that would change the plan or the appropriateness of the plan.  The patient and/or family understand the potential benefits and risks.  Mayra ReelN. Michael Braylea Brancato, MD 12/25/2014 7:42 AM

## 2014-12-25 NOTE — Anesthesia Preprocedure Evaluation (Addendum)
Anesthesia Evaluation  Patient identified by MRN, date of birth, ID band Patient awake    Reviewed: Allergy & Precautions, NPO status , Patient's Chart, lab work & pertinent test results, reviewed documented beta blocker date and time   Airway Mallampati: I  TM Distance: >3 FB Neck ROM: Full    Dental   Pulmonary Current Smoker (27 pack year),    Pulmonary exam normal       Cardiovascular + CAD and + Past MI Normal cardiovascular exam STENT 2001, ECHO 12/2014 EF 60%   Neuro/Psych Anxiety Depression TIA Neuromuscular disease    GI/Hepatic GERD-  Medicated,  Endo/Other  diabetes, Type 2, Insulin Dependent  Renal/GU      Musculoskeletal  (+) Arthritis -,   Abdominal   Peds  Hematology 9/27   Anesthesia Other Findings   Reproductive/Obstetrics                           Anesthesia Physical Anesthesia Plan  ASA: III  Anesthesia Plan: General and Regional   Post-op Pain Management:    Induction: Intravenous  Airway Management Planned: Natural Airway  Additional Equipment:   Intra-op Plan:   Post-operative Plan:   Informed Consent: I have reviewed the patients History and Physical, chart, labs and discussed the procedure including the risks, benefits and alternatives for the proposed anesthesia with the patient or authorized representative who has indicated his/her understanding and acceptance.     Plan Discussed with: CRNA and Surgeon  Anesthesia Plan Comments:        Anesthesia Quick Evaluation

## 2014-12-25 NOTE — Progress Notes (Addendum)
Report called to Sterling Ranchamikla, Charity fundraiserN.

## 2014-12-25 NOTE — Discharge Instructions (Signed)
° ° °  1. Keep splint clean and dry °2. Elevate foot above level of the heart °3. Take aspirin to prevent blood clots °4. Take pain meds as needed °5. Strict non weight bearing to operative extremity ° °

## 2014-12-26 ENCOUNTER — Encounter (HOSPITAL_COMMUNITY): Payer: Self-pay | Admitting: Orthopaedic Surgery

## 2014-12-26 LAB — CBC
HEMATOCRIT: 27.5 % — AB (ref 36.0–46.0)
Hemoglobin: 9 g/dL — ABNORMAL LOW (ref 12.0–15.0)
MCH: 27.4 pg (ref 26.0–34.0)
MCHC: 32.7 g/dL (ref 30.0–36.0)
MCV: 83.8 fL (ref 78.0–100.0)
PLATELETS: 295 10*3/uL (ref 150–400)
RBC: 3.28 MIL/uL — ABNORMAL LOW (ref 3.87–5.11)
RDW: 15.3 % (ref 11.5–15.5)
WBC: 7.9 10*3/uL (ref 4.0–10.5)

## 2014-12-26 LAB — BASIC METABOLIC PANEL
ANION GAP: 12 (ref 5–15)
BUN: <5 mg/dL — ABNORMAL LOW (ref 6–20)
CHLORIDE: 104 mmol/L (ref 101–111)
CO2: 21 mmol/L — AB (ref 22–32)
Calcium: 8.9 mg/dL (ref 8.9–10.3)
Creatinine, Ser: 0.73 mg/dL (ref 0.44–1.00)
Glucose, Bld: 240 mg/dL — ABNORMAL HIGH (ref 65–99)
Potassium: 3.2 mmol/L — ABNORMAL LOW (ref 3.5–5.1)
SODIUM: 137 mmol/L (ref 135–145)

## 2014-12-26 LAB — GLUCOSE, CAPILLARY
Glucose-Capillary: 254 mg/dL — ABNORMAL HIGH (ref 65–99)
Glucose-Capillary: 175 mg/dL — ABNORMAL HIGH (ref 65–99)
Glucose-Capillary: 183 mg/dL — ABNORMAL HIGH (ref 65–99)
Glucose-Capillary: 168 mg/dL — ABNORMAL HIGH (ref 65–99)

## 2014-12-26 MED ORDER — INSULIN GLARGINE 100 UNIT/ML ~~LOC~~ SOLN
7.0000 [IU] | Freq: Every day | SUBCUTANEOUS | Status: DC
  Administered 2014-12-26 – 2014-12-27 (×2): 7 [IU] via SUBCUTANEOUS
  Filled 2014-12-26 (×2): qty 0.07

## 2014-12-26 MED ORDER — POTASSIUM CHLORIDE CRYS ER 20 MEQ PO TBCR
40.0000 meq | EXTENDED_RELEASE_TABLET | Freq: Once | ORAL | Status: AC
  Administered 2014-12-26: 40 meq via ORAL
  Filled 2014-12-26: qty 2

## 2014-12-26 NOTE — Clinical Social Work Placement (Signed)
   CLINICAL SOCIAL WORK PLACEMENT  NOTE  Date:  12/26/2014  Patient Details  Name: Beth Payne MRN: [id number] Date of Birth: December 04, 1940  Clinical Social Work is seeking post-discharge placement for this patient at the Skilled  Nursing Facility level of care (*CSW will initial, date and re-position this form in  chart as items are completed):  Yes   Patient/family provided with [place] Clinical Social Work Department's list of facilities offering this level of care within the geographic area requested by the patient (or if unable, by the patient's family).  Yes   Patient/family informed of their freedom to choose among providers that offer the needed level of care, that participate in Medicare, Medicaid or managed care program needed by the patient, have an available bed and are willing to accept the patient.  Yes   Patient/family informed of [facility]'s ownership interest in [facility] and [facility], as well as of the fact that they are under no obligation to receive care at these facilities.  PASRR submitted to EDS on  (n/a)     PASRR number received on  (n/a)     Existing PASRR number confirmed on 12/26/14     FL2 transmitted to all facilities in geographic area requested by pt/family on 12/26/14     FL2 transmitted to all facilities within larger geographic area on  (n/a)     Patient informed that his/her managed care company has contracts with or will negotiate with certain facilities, including the following:   (yes, [facility])         Patient/family informed of bed offers received.  Patient chooses bed at       Physician recommends and patient chooses bed at      Patient to be transferred to   on  .  Patient to be transferred to facility by       Patient family notified on   of transfer.  Name of family member notified:        PHYSICIAN Please sign FL2     Additional Comment:    _______________________________________________ Rod Mae, LCSW 12/26/2014, 12:05 PM [phone]

## 2014-12-26 NOTE — Progress Notes (Signed)
   Subjective:  Patient reports pain as mild.    Objective:   VITALS:   Filed Vitals:   12/25/14 1835 12/25/14 2222 12/26/14 0151 12/26/14 0524  BP: 160/60 161/75 151/54 170/56  Pulse: 92 95 86 87  Temp: 98.3 F (36.8 C) 98.7 F (37.1 C) 99.2 F (37.3 C) 98.8 F (37.1 C)  TempSrc: Oral Oral Oral Oral  Resp: Height:      Weight:      SpO2: 98% 99% 94% 100%    Neurologically intact Neurovascular intact Sensation intact distally Intact pulses distally Dorsiflexion/Plantar flexion intact Incision: dressing C/D/I and no drainage No cellulitis present Compartment soft   Lab Results  Component Value Date   WBC 7.9 12/26/2014   HGB 9.0* 12/26/2014   HCT 27.5* 12/26/2014   MCV 83.8 12/26/2014   PLT 295 12/26/2014     Assessment/Plan:  1 Day Post-Op   - Expected postop acute blood loss anemia - will monitor for symptoms - Up with PT/OT - DVT ppx - SCDs, ambulation, aspirin - NWB left lower extremity - will need SNF - f/u 1 week for wound check  Cheral Almas 12/26/2014, 7:44 AM [phone]

## 2014-12-26 NOTE — Anesthesia Postprocedure Evaluation (Signed)
Anesthesia Post Note  Patient: Beth Payne  Procedure(s) Performed: Procedure(s) (LRB): OPEN REDUCTION INTERNAL FIXATION (ORIF) LEFT TRIMALLEOLAR ANKLE FRACTURE (Left)  Anesthesia type: general  Patient location: PACU  Post pain: Pain level controlled  Post assessment: Patient's Cardiovascular Status Stable   Post vital signs: Reviewed and stable  Level of consciousness: sedated  Complications: No apparent anesthesia complications

## 2014-12-26 NOTE — Progress Notes (Signed)
PROGRESS NOTE  Beth Payne UJW:[id number] DOB: 1940/07/02 DOA: 12/23/2014 PCP: [name], MD  Beth Payne is a 74 y.o. female Who presented with left ankle pain and swelling. She says she was walking to the bathroom with her walker when her foot became twisted (chart reports that her foot was caught in a wheelchair 2 days ago.)   She was recently in the hospital from 7/7-7/11: was on Lantus 12 units the night before, the other episodes of hypoglycemia happened during this other night where patient did not receive Lantus, so patient was discharged on Lantus 4 units subcutaneous daily, and custom insulin sliding-scale  ER physician attempted reduction without success, Dr. Roda Shutters saw patient in ER, and reduced it. Patent had ORIF on Wednesday    Assessment/Plan: Left Ankle fx: s/p ORIF 7/20 by Dr. Roda Shutters - pain control as needed -elevate -bed rest  DM- type 1; poorly controlled- recently in hospital for DKA-  -increase lantus, continue SSI- watch for hypoglycemia -had insulin pump but there was questions as to if the pump was working (follows with Dr. Evlyn Kanner)  CAD- no chest pain with activity EF in 7/16 was 55-60% Low risk stress test- 7/16  Anemia -monitor Hgb with surgery -type and cross- trend  Code Status: full Family Communication: patient Disposition Plan:    Consultants:    Procedures:    HPI/Subjective: Not very hungry Does not want to go back to same rehab  Objective: Filed Vitals:   12/26/14 0524  BP: 170/56  Pulse: 87  Temp: 98.8 F (37.1 C)  Resp: 16    Intake/Output Summary (Last 24 hours) at 12/26/14 0813 Last data filed at 12/26/14 0000  Gross per 24 hour  Intake   2980 ml  Output    525 ml  Net   2455 ml   Filed Weights   12/23/14 0954  Weight: 50.349 kg (111 lb)    Exam:   General:  Pleasant/cooperative  Cardiovascular: rrr  Respiratory: clear  Abdomen: +BS, soft   Data Reviewed: Basic Metabolic  Panel:  Recent Labs Lab 12/23/14 1042 12/24/14 0335 12/26/14 0340  NA 137 134* 137  K 3.7 3.5 3.2*  CL 98* 102 104  CO2 24 22 21*  GLUCOSE 377* 173* 240*  BUN <5* 7 <5*  CREATININE 0.83 0.93 0.73  CALCIUM 9.6 9.0 8.9   Liver Function Tests: No results for input(s): AST, ALT, ALKPHOS, BILITOT, PROT, ALBUMIN in the last 168 hours. No results for input(s): LIPASE, AMYLASE in the last 168 hours. No results for input(s): AMMONIA in the last 168 hours. CBC:  Recent Labs Lab 12/23/14 1042 12/24/14 0335 12/26/14 0340  WBC 7.8 6.6 7.9  NEUTROABS 6.2  --   --   HGB 10.6* 8.7* 9.0*  HCT 32.2* 26.8* 27.5*  MCV 83.6 84.0 83.8  PLT 304 280 295   Cardiac Enzymes: No results for input(s): CKTOTAL, CKMB, CKMBINDEX, TROPONINI in the last 168 hours. BNP (last 3 results) No results for input(s): BNP in the last 8760 hours.  ProBNP (last 3 results) No results for input(s): PROBNP in the last 8760 hours.  CBG:  Recent Labs Lab 12/24/14 2144 12/25/14 0735 12/25/14 1149 12/25/14 1502 12/25/14 2220  GLUCAP 162* 205* 148* 115* 231*    Recent Results (from the past 240 hour(s))  Urine culture     Status: None   Collection Time: 12/23/14 10:33 AM  Result Value Ref Range Status   Specimen Description URINE, CLEAN CATCH  Final  Special Requests Normal  Final   Culture   Final    MULTIPLE SPECIES PRESENT, SUGGEST RECOLLECTION IF CLINICALLY INDICATED   Report Status 12/24/2014 FINAL  Final     Studies: Dg Ankle Complete Left  01/17/2015   CLINICAL DATA:  ORIF LEFT ankle  EXAM: DG C-ARM 61-120 MIN; LEFT ANKLE COMPLETE - 3+ VIEW  COMPARISON:  Three digital C-arm fluoroscopic images obtained intraoperatively are compared to prior study of 12/23/2014 and CT LEFT ankle of 12/24/2014  FINDINGS: Diffuse osseous demineralization.  Lateral plate and multiple screws with placed across a reduced fracture of the distal LEFT fibula.  Two cannulated screws placed across to reduced fracture of  the medial malleolus.  Ankle mortise intact.  No additional fracture or dislocation identified.  IMPRESSION: Post bimalleolar ORIF LEFT ankle.   Electronically Signed   By: Ulyses Southward M.D.   On: Jan 17, 2015 17:52   Dg C-arm 1-60 Min  2015/01/17   CLINICAL DATA:  ORIF LEFT ankle  EXAM: DG C-ARM 61-120 MIN; LEFT ANKLE COMPLETE - 3+ VIEW  COMPARISON:  Three digital C-arm fluoroscopic images obtained intraoperatively are compared to prior study of 12/23/2014 and CT LEFT ankle of 12/24/2014  FINDINGS: Diffuse osseous demineralization.  Lateral plate and multiple screws with placed across a reduced fracture of the distal LEFT fibula.  Two cannulated screws placed across to reduced fracture of the medial malleolus.  Ankle mortise intact.  No additional fracture or dislocation identified.  IMPRESSION: Post bimalleolar ORIF LEFT ankle.   Electronically Signed   By: Ulyses Southward M.D.   On: 01-17-2015 17:52    Scheduled Meds: . ARIPiprazole  2 mg Oral QHS  . aspirin EC  325 mg Oral BID  . cholecalciferol  2,000 Units Oral Daily  . docusate sodium  100 mg Oral BID  . insulin aspart  0-5 Units Subcutaneous QHS  . insulin aspart  0-9 Units Subcutaneous TID WC  . insulin glargine  7 Units Subcutaneous Daily  . multivitamin with minerals  1 tablet Oral Daily  . pantoprazole  40 mg Oral Daily  . rosuvastatin  5 mg Oral Daily  . sertraline  150 mg Oral Daily   Continuous Infusions: . sodium chloride 50 mL/hr at 12/24/14 0340  . lactated ringers 10 mL/hr at 2015-01-17 1501   Antibiotics Given (last 72 hours)    Date/Time Action Medication Dose   2015/01/17 1600 Given   ceFAZolin (ANCEF) IVPB 2 g/50 mL premix 2 g      Active Problems:   CAD s/p stent to mid-RCA in 2001.   DM (diabetes mellitus), type 1 with complications   Fracture, ankle    Time spent: 15 min    Tymber Stallings  [facility] Pager [phone]. If 7PM-7AM, please contact night-coverage at [email or web address], password [facility] 12/26/2014,  8:13 AM  LOS: 3 days

## 2014-12-26 NOTE — Clinical Social Work Note (Signed)
Clinical Social Work Assessment  Patient Details  Name: Beth Payne MRN: [id number] Date of Birth: May 19, 1941  Date of referral:  12/26/14               Reason for consult:  Facility Placement, Discharge Planning                Permission sought to share information with:  Chartered certified accountant granted to share information::  Yes, Verbal Permission Granted  Name::     n/a  Agency::  [facility] SNF  Relationship::  n/a  Contact Information:  n/a  Housing/Transportation Living arrangements for the past 2 months:  [place], [place] (Admitted from [facility] SNF.) Source of Information:  Patient Patient Interpreter Needed:  None Criminal Activity/Legal Involvement Pertinent to Current Situation/Hospitalization:  No - Comment as needed Significant Relationships:  Significant Other Lives with:  Self Do you feel safe going back to the place where you live?  No (High fall risk.) Need for family participation in patient care:  No (Coment) (Patient able to make own decisions.)  Care giving concerns:  Patient expressed concern regarding returning to [facility] SNF at time of discharge. Patient requesting new SNF placement.   Social Worker assessment / plan:  CSW received referral patient admitted from [facility] and will need continued care at SNF level once medically stable for discharge. CSW met with patient to discuss discharge disposition. Patient agreeable to returning to SNF level of care at discharge, but requesting new SNF placement. CSW to continue to follow and assist with discharge planning needs.  Employment status:  Retired Forensic scientist:  Medicare PT Recommendations:  Not assessed at this time [place] / Referral to community resources:  [place]  Patient/Family's Response to care:  Patient understanding and agreeable to CSW plan of care.  Patient/Family's  Understanding of and Emotional Response to Diagnosis, Current Treatment, and Prognosis:  Patient understanding and agreeable to CSW plan of care.  Emotional Assessment Appearance:  Appears stated age Attitude/Demeanor/Rapport:  Other (Pleasant.) Affect (typically observed):  Pleasant, Apprehensive, Hopeful Orientation:  Oriented to Self, Oriented to Place, Oriented to  Time, Oriented to Situation Alcohol / Substance use:  Not Applicable Psych involvement (Current and /or in the community):  No (Comment) (Not appropriate on this admission.)  Discharge Needs  Concerns to be addressed:  No discharge needs identified Readmission within the last 30 days:  No Current discharge risk:  None Barriers to Discharge:  No Barriers Identified   Caroline Sauger, LCSW 12/26/2014, 11:50 AM [phone]

## 2014-12-26 NOTE — Evaluation (Signed)
Physical Therapy Evaluation Patient Details Name: Beth Payne MRN: [id number] DOB: 02-22-1941 Today's Date: 12/26/2014   History of Present Illness  Admitted with L ankle fracture and subsequent ORIF after falling at SNF when ambulating in her room.  She had been there recovering from recent hospitalization for DKA and NSTEMI. PMH: IDDM, GERD, CD, OA, depression  Clinical Impression   Pt admitted with above diagnosis. Pt currently with functional limitations due to the deficits listed below (see PT Problem List). Pt will benefit from skilled PT to increase their independence and safety with mobility to allow discharge to the venue listed below.  Pt requiring +2 A for SPT and standing due to NWB status. Encouraged pt to do therex in chair, but pt politely declined. Recommend SNF for post acute rehab and pt in agreement.        Follow Up Recommendations SNF    Equipment Recommendations  None recommended by PT    Recommendations for Other Services       Precautions / Restrictions Restrictions LLE Weight Bearing: Non weight bearing      Mobility  Bed Mobility Overal bed mobility: Needs Assistance Bed Mobility: Supine to Sit     Supine to sit: Min assist     General bed mobility comments: MIN A for L LE and to get hips fully turned  Transfers Overall transfer level: Needs assistance Equipment used: Rolling walker (2 wheeled) Transfers: Sit to/from [employer] Sit to Stand: Max assist;+2 physical assistance Stand pivot transfers: Max assist;+2 physical assistance       General transfer comment: Stood at [employer] with MAX of 2 and able to stand L NWB with fatigue.  Sat back on bed and then did SPT with MAX of 2 without RW.  Ambulation/Gait                Stairs            Wheelchair Mobility    Modified Rankin (Stroke Patients Only)       Balance Overall balance assessment: History of Falls;Needs assistance   Sitting balance-Leahy  Scale: Fair     Standing balance support: Bilateral upper extremity supported Standing balance-Leahy Scale: Zero                               Pertinent Vitals/Pain Pain Assessment: 0-10 Pain Score: 8  Pain Location: L anterior ankle Pain Descriptors / Indicators: Burning;Aching;Sore Pain Intervention(s): Monitored during session;Repositioned;Limited activity within patient's tolerance    Home Living Family/patient expects to be discharged to:: Skilled nursing facility Living Arrangements: Alone Available Help at Discharge: Skilled Nursing Facility                  Prior Function           Comments: Pt has been at [facility] rehab and ambulating with RW with therapy     Hand Dominance        Extremity/Trunk Assessment   Upper Extremity Assessment: Generalized weakness           Lower Extremity Assessment: Generalized weakness;LLE deficits/detail   LLE Deficits / Details: L lower leg cast     Communication   Communication: No difficulties  Cognition Arousal/Alertness: Awake/alert Behavior During Therapy: WFL for tasks assessed/performed Overall Cognitive Status: Within Functional Limits for tasks assessed  General Comments General comments (skin integrity, edema, etc.): Encouraged pt to do therex and pt stated, "not today".    Exercises        Assessment/Plan    PT Assessment Patient needs continued PT services  PT Diagnosis Difficulty walking;Generalized weakness;Acute pain   PT Problem List Decreased activity tolerance;Decreased balance;Decreased mobility;Decreased strength;Decreased knowledge of use of DME;Decreased knowledge of precautions  PT Treatment Interventions Gait training;Functional mobility training;Therapeutic activities;Therapeutic exercise;DME instruction;Balance training   PT Goals (Current goals can be found in the Care Plan section) Acute Rehab PT Goals Patient Stated Goal: Pt agreeable to  go to SNF at time of dc PT Goal Formulation: With patient Time For Goal Achievement: 01/02/15 Potential to Achieve Goals: Good    Frequency Min 3X/week   Barriers to discharge Decreased caregiver support      Co-evaluation               End of Session Equipment Utilized During Treatment: Gait belt Activity Tolerance: Patient limited by fatigue;Patient limited by pain Patient left: in chair;with call bell/phone within reach Nurse Communication: Mobility status (NT educated on how to A pt with transfer back to bed)         Time: 0243-0256 PT Time Calculation (min) (ACUTE ONLY): 13 min   Charges:   PT Evaluation $Initial PT Evaluation Tier I: 1 Procedure     PT G Codes:        Mindi Akerson LUBECK 12/26/2014, 3:18 PM

## 2014-12-27 DIAGNOSIS — S82852D Displaced trimalleolar fracture of left lower leg, subsequent encounter for closed fracture with routine healing: Secondary | ICD-10-CM | POA: Diagnosis not present

## 2014-12-27 DIAGNOSIS — S82892D Other fracture of left lower leg, subsequent encounter for closed fracture with routine healing: Secondary | ICD-10-CM | POA: Diagnosis not present

## 2014-12-27 DIAGNOSIS — R2689 Other abnormalities of gait and mobility: Secondary | ICD-10-CM | POA: Diagnosis not present

## 2014-12-27 DIAGNOSIS — K219 Gastro-esophageal reflux disease without esophagitis: Secondary | ICD-10-CM | POA: Diagnosis not present

## 2014-12-27 DIAGNOSIS — E108 Type 1 diabetes mellitus with unspecified complications: Secondary | ICD-10-CM

## 2014-12-27 DIAGNOSIS — E10319 Type 1 diabetes mellitus with unspecified diabetic retinopathy without macular edema: Secondary | ICD-10-CM | POA: Diagnosis not present

## 2014-12-27 DIAGNOSIS — F339 Major depressive disorder, recurrent, unspecified: Secondary | ICD-10-CM | POA: Diagnosis not present

## 2014-12-27 DIAGNOSIS — D649 Anemia, unspecified: Secondary | ICD-10-CM | POA: Diagnosis not present

## 2014-12-27 DIAGNOSIS — E876 Hypokalemia: Secondary | ICD-10-CM | POA: Diagnosis not present

## 2014-12-27 DIAGNOSIS — R4182 Altered mental status, unspecified: Secondary | ICD-10-CM | POA: Diagnosis not present

## 2014-12-27 DIAGNOSIS — R410 Disorientation, unspecified: Secondary | ICD-10-CM | POA: Diagnosis not present

## 2014-12-27 DIAGNOSIS — M6281 Muscle weakness (generalized): Secondary | ICD-10-CM | POA: Diagnosis not present

## 2014-12-27 DIAGNOSIS — I25111 Atherosclerotic heart disease of native coronary artery with angina pectoris with documented spasm: Secondary | ICD-10-CM | POA: Diagnosis not present

## 2014-12-27 DIAGNOSIS — R7309 Other abnormal glucose: Secondary | ICD-10-CM | POA: Diagnosis not present

## 2014-12-27 DIAGNOSIS — K5900 Constipation, unspecified: Secondary | ICD-10-CM | POA: Diagnosis not present

## 2014-12-27 DIAGNOSIS — E860 Dehydration: Secondary | ICD-10-CM | POA: Diagnosis not present

## 2014-12-27 DIAGNOSIS — Z8673 Personal history of transient ischemic attack (TIA), and cerebral infarction without residual deficits: Secondary | ICD-10-CM | POA: Diagnosis not present

## 2014-12-27 DIAGNOSIS — R6520 Severe sepsis without septic shock: Secondary | ICD-10-CM | POA: Diagnosis not present

## 2014-12-27 DIAGNOSIS — X58XXXD Exposure to other specified factors, subsequent encounter: Secondary | ICD-10-CM | POA: Diagnosis not present

## 2014-12-27 DIAGNOSIS — Z471 Aftercare following joint replacement surgery: Secondary | ICD-10-CM | POA: Diagnosis not present

## 2014-12-27 DIAGNOSIS — Z96662 Presence of left artificial ankle joint: Secondary | ICD-10-CM | POA: Diagnosis not present

## 2014-12-27 DIAGNOSIS — M797 Fibromyalgia: Secondary | ICD-10-CM | POA: Diagnosis not present

## 2014-12-27 DIAGNOSIS — I259 Chronic ischemic heart disease, unspecified: Secondary | ICD-10-CM | POA: Diagnosis not present

## 2014-12-27 DIAGNOSIS — F17210 Nicotine dependence, cigarettes, uncomplicated: Secondary | ICD-10-CM | POA: Diagnosis not present

## 2014-12-27 DIAGNOSIS — J9811 Atelectasis: Secondary | ICD-10-CM | POA: Diagnosis not present

## 2014-12-27 DIAGNOSIS — Z4789 Encounter for other orthopedic aftercare: Secondary | ICD-10-CM | POA: Diagnosis not present

## 2014-12-27 DIAGNOSIS — E1065 Type 1 diabetes mellitus with hyperglycemia: Secondary | ICD-10-CM | POA: Diagnosis not present

## 2014-12-27 DIAGNOSIS — S82892K Other fracture of left lower leg, subsequent encounter for closed fracture with nonunion: Secondary | ICD-10-CM | POA: Diagnosis not present

## 2014-12-27 DIAGNOSIS — F322 Major depressive disorder, single episode, severe without psychotic features: Secondary | ICD-10-CM | POA: Diagnosis not present

## 2014-12-27 DIAGNOSIS — I2510 Atherosclerotic heart disease of native coronary artery without angina pectoris: Secondary | ICD-10-CM | POA: Diagnosis not present

## 2014-12-27 DIAGNOSIS — M79672 Pain in left foot: Secondary | ICD-10-CM | POA: Diagnosis not present

## 2014-12-27 DIAGNOSIS — I10 Essential (primary) hypertension: Secondary | ICD-10-CM | POA: Diagnosis not present

## 2014-12-27 DIAGNOSIS — G9340 Encephalopathy, unspecified: Secondary | ICD-10-CM | POA: Diagnosis not present

## 2014-12-27 DIAGNOSIS — D62 Acute posthemorrhagic anemia: Secondary | ICD-10-CM | POA: Diagnosis not present

## 2014-12-27 DIAGNOSIS — Z681 Body mass index (BMI) 19 or less, adult: Secondary | ICD-10-CM | POA: Diagnosis not present

## 2014-12-27 DIAGNOSIS — R0989 Other specified symptoms and signs involving the circulatory and respiratory systems: Secondary | ICD-10-CM | POA: Diagnosis not present

## 2014-12-27 DIAGNOSIS — E785 Hyperlipidemia, unspecified: Secondary | ICD-10-CM | POA: Diagnosis not present

## 2014-12-27 DIAGNOSIS — S82892A Other fracture of left lower leg, initial encounter for closed fracture: Secondary | ICD-10-CM | POA: Diagnosis not present

## 2014-12-27 DIAGNOSIS — E119 Type 2 diabetes mellitus without complications: Secondary | ICD-10-CM | POA: Diagnosis not present

## 2014-12-27 DIAGNOSIS — R739 Hyperglycemia, unspecified: Secondary | ICD-10-CM | POA: Diagnosis not present

## 2014-12-27 DIAGNOSIS — Z66 Do not resuscitate: Secondary | ICD-10-CM | POA: Diagnosis not present

## 2014-12-27 LAB — GLUCOSE, CAPILLARY
GLUCOSE-CAPILLARY: 211 mg/dL — AB (ref 65–99)
GLUCOSE-CAPILLARY: 255 mg/dL — AB (ref 65–99)

## 2014-12-27 MED ORDER — POLYETHYLENE GLYCOL 3350 17 G PO PACK: 17.0000 g | PACK | Freq: Every day | ORAL | Status: DC | PRN

## 2014-12-27 MED ORDER — INSULIN GLARGINE 100 UNIT/ML ~~LOC~~ SOLN: 8.0000 [IU] | Freq: Every day | SUBCUTANEOUS | Status: DC

## 2014-12-27 MED ORDER — ACETAMINOPHEN 325 MG PO TABS: 650.0000 mg | ORAL_TABLET | Freq: Four times a day (QID) | ORAL | Status: DC | PRN

## 2014-12-27 MED ORDER — ASPIRIN 325 MG PO TBEC: 325.0000 mg | DELAYED_RELEASE_TABLET | Freq: Two times a day (BID) | ORAL | Status: DC

## 2014-12-27 NOTE — Progress Notes (Signed)
Physical Therapy Treatment Patient Details Name: Beth Payne MRN: [id number] DOB: [birth year] Today's Date: 12/27/2014    History of Present Illness Admitted with L ankle fracture and subsequent ORIF after falling at SNF when ambulating in her room.  She had been there recovering from recent hospitalization for DKA and NSTEMI. PMH: IDDM, GERD, CD, OA, depression    PT Comments    Pt agreeable to therex today after SPT with MAX of 2.  Continues with Pain in L LE, primarily with bed mobility. Con't to recommend SNF for further rehab.  Follow Up Recommendations  SNF     Equipment Recommendations  None recommended by PT    Recommendations for Other Services       Precautions / Restrictions Precautions Precautions: Fall Restrictions LLE Weight Bearing: Non weight bearing    Mobility  Bed Mobility Overal bed mobility: Needs Assistance Bed Mobility: Supine to Sit     Supine to sit: Min assist     General bed mobility comments: MIN A for L LE  Transfers Overall transfer level: Needs assistance Equipment used: 2 person hand held assist Transfers: Stand Pivot Transfers   Stand pivot transfers: Max assist;+2 physical assistance       General transfer comment: cues for R foot placement for transfer  Ambulation/Gait                 Stairs            Wheelchair Mobility    Modified Rankin (Stroke Patients Only)       Balance Overall balance assessment: History of Falls   Sitting balance-Leahy Scale: Fair       Standing balance-Leahy Scale: Zero                      Cognition Arousal/Alertness: Awake/alert Behavior During Therapy: WFL for tasks assessed/performed Overall Cognitive Status: Within Functional Limits for tasks assessed                      Exercises General Exercises - Lower Extremity Ankle Circles/Pumps: Right;10 reps;Seated Quad Sets: Strengthening;Both;10 reps;Seated Gluteal Sets: Strengthening;5  reps;Both Heel Slides: Right;AROM;10 reps;Supine    General Comments        Pertinent Vitals/Pain Pain Assessment: 0-10 Pain Score: 8  Pain Location: L ankle Pain Descriptors / Indicators: Operative site guarding;Moaning;Heaviness Pain Intervention(s): RN gave pain meds during session;Repositioned;Relaxation    Home Living                      Prior Function            PT Goals (current goals can now be found in the care plan section) Acute Rehab PT Goals Patient Stated Goal: Pt agreeable to go to SNF at time of dc PT Goal Formulation: With patient Time For Goal Achievement: 01/02/15 Potential to Achieve Goals: Good Progress towards PT goals: Progressing toward goals    Frequency  Min 3X/week    PT Plan Current plan remains appropriate    Co-evaluation             End of Session Equipment Utilized During Treatment: Gait belt Activity Tolerance: Patient limited by pain Patient left: in chair;with call bell/phone within reach     Time: 1123-1140 PT Time Calculation (min) (ACUTE ONLY): 17 min  Charges:  $Therapeutic Activity: 8-22 mins  G Codes:      Vivianne Carles LUBECK 12/27/2014, 12:06 PM

## 2014-12-27 NOTE — Discharge Planning (Signed)
Patient to be discharged to [facility]. Patient updated at bedside.  Facility: [facility] RN report number: [phone] Transportation: EMS ([facility])  Tityana Pagan, LCSWA - [phone] Clinical Social Work Department Orthopedics ([id number]) and Surgical ([id number])   

## 2014-12-27 NOTE — Clinical Social Work Placement (Signed)
   CLINICAL SOCIAL WORK PLACEMENT  NOTE  Date:  12/27/2014  Patient Details  Name: Beth Payne MRN: [id number] Date of Birth: January 16, 1941  Clinical Social Work is seeking post-discharge placement for this patient at the Skilled  Nursing Facility level of care (*CSW will initial, date and re-position this form in  chart as items are completed):  Yes   Patient/family provided with [facility] Clinical Social Work Department's list of facilities offering this level of care within the geographic area requested by the patient (or if unable, by the patient's family).  Yes   Patient/family informed of their freedom to choose among providers that offer the needed level of care, that participate in Medicare, Medicaid or managed care program needed by the patient, have an available bed and are willing to accept the patient.  Yes   Patient/family informed of [facility]'s ownership interest in [facility] and [facility], as well as of the fact that they are under no obligation to receive care at these facilities.  PASRR submitted to EDS on  (n/a)     PASRR number received on  (n/a)     Existing PASRR number confirmed on 12/26/14     FL2 transmitted to all facilities in geographic area requested by pt/family on 12/26/14     FL2 transmitted to all facilities within larger geographic area on  (n/a)     Patient informed that his/her managed care company has contracts with or will negotiate with certain facilities, including the following:   (yes, [facility])     Yes   Patient/family informed of bed offers received.  Patient chooses bed at [facility]     Physician recommends and patient chooses bed at  (n/a)    Patient to be transferred to [facility] on 12/27/14.  Patient to be transferred to facility by [facility]     Patient family notified on 12/27/14 of transfer.  Name of family member notified:  Patient updated at bedside.     PHYSICIAN       Additional Comment:     _______________________________________________ Rod Mae, LCSW 12/27/2014, 2:45 PM [phone]

## 2014-12-27 NOTE — Discharge Summary (Signed)
Physician Discharge Summary  Beth Payne ZOX:[id number] DOB: 16-Jul-1940 DOA: 12/23/2014  PCP: [name], MD  Admit date: 12/23/2014 Discharge date: 12/27/2014  Time spent: greater than 30 minutes  Recommendations for Outpatient Follow-up:  1. To SNF for rehab 2. Optimize diabetes control 3. f/u with Dr. Roda Shutters next week for wound check 4. Follow-up with Dr. Evlyn Kanner for diabetes. 5. Nonweightbearing to left leg 6. Twice daily aspirin for DVT prophylaxis per orthopedics.  Discharge Diagnoses:  Left trimoleolar fracture Active Problems:   CAD s/p stent to mid-RCA in 2001.   DM (diabetes mellitus), type 1 with complications, uncontrolled  Discharge Condition: Stable  Diet recommendation: Diabetic heart healthy  Filed Weights   12/23/14 0954  Weight: 50.349 kg (111 lb)    History of present illness/Hospital Course:  74 y.o. female Who presented with left ankle pain and swelling. She says she was walking to the bathroom with her walker when her foot became twisted (chart reports that her foot was caught in a wheelchair 2 days ago.)   She was recently in the hospital from 7/7-7/11: was on Lantus 12 units the night before, the other episodes of hypoglycemia happened during this other night where patient did not receive Lantus, so patient was discharged on Lantus 4 units subcutaneous daily, and custom insulin sliding-scale  ER physician attempted reduction without success, Dr. Roda Shutters saw patient in ER, and reduced it. Patent had ORIF on Wednesday   Assessment/Plan: Left Ankle fx: s/p ORIF 7/20 by Dr. Roda Shutters Closed reduction and then subsequent ORIF. Has been doing well postoperatively. Will be nonweightbearing. Orthopedics recommends twice daily aspirin for DVT prophylaxis.  Pain has been controlled. Stable for transfer to skilled nursing facility  DM- type 1; poorly controlled- recently in hospital for DKA-  -increase lantus, continue SSI- watch for hypoglycemia -had insulin  pump but there was questions as to if the pump was working (follows with Dr. Evlyn Kanner) Will discharge on Lantus and NovoLog sliding scale. May follow-up with Dr. Evlyn Kanner to get back on the pump if indicated.  CAD- no chest pain with activity EF in 7/16 was 55-60% Low risk stress test- 7/16  Anemia -monitor Hgb with surgery No transfusion requirement  Procedures:  ORIF left ankle fracture 7/20  Consultations:  Orthopedics, Xu  Discharge Exam: Filed Vitals:   12/27/14 0454  BP: 156/56  Pulse: 80  Temp: 98.8 F (37.1 C)  Resp: 18    General: Alert, oriented. Comfortable. Cardiovascular: Regular rate rhythm without murmurs gallops rubs Respiratory: Clear to auscultation bilaterally without wheezes rhonchi or rales Extremities: Left leg in a splint.  Discharge Instructions   Discharge Instructions    Diet - low sodium heart healthy    Complete by:  As directed      Diet Carb Modified    Complete by:  As directed      Non weight bearing    Complete by:  As directed   Laterality:  left  Extremity:  Lower     Walk with assistance    Complete by:  As directed           Current Discharge Medication List    START taking these medications   Details  acetaminophen (TYLENOL) 325 MG tablet Take 2 tablets (650 mg total) by mouth every 6 (six) hours as needed for mild pain (or Fever >/= 101).    HYDROcodone-acetaminophen (NORCO) 5-325 MG per tablet Take 1-2 tablets by mouth every 6 (six) hours as needed. Qty: 90 tablet, Refills:  0    polyethylene glycol (MIRALAX / GLYCOLAX) packet Take 17 g by mouth daily as needed for mild constipation. Qty: 14 each, Refills: 0      CONTINUE these medications which have CHANGED   Details  aspirin 325 MG EC tablet Take 1 tablet (325 mg total) by mouth 2 (two) times daily. For 6 weeks, then daily    insulin glargine (LANTUS) 100 UNIT/ML injection Inject 0.08 mLs (8 Units total) into the skin daily. Qty: 10 mL, Refills: 11       CONTINUE these medications which have NOT CHANGED   Details  ARIPiprazole (ABILIFY) 2 MG tablet Take 2 mg by mouth at bedtime.     cholecalciferol (VITAMIN D) 1000 UNITS tablet Take 2,000 Units by mouth daily.    docusate sodium (COLACE) 100 MG capsule Take 100 mg by mouth 2 (two) times daily.    Multiple Vitamin (MULITIVITAMIN WITH MINERALS) TABS Take 1 tablet by mouth daily.    omeprazole (PRILOSEC) 20 MG capsule Take 40 mg by mouth daily.    rosuvastatin (CRESTOR) 5 MG tablet Take 5 mg by mouth daily.    sertraline (ZOLOFT) 100 MG tablet Take 150 mg by mouth daily.    insulin aspart (NOVOLOG) 100 UNIT/ML injection Inject 0-6 Units into the skin 3 (three) times daily with meals. 121-150=0 units 151-200=1 unit 201-250=2 units  251-300=3 units 301-350=4 units 351-400=6 units >400 give 6 units and call MD Qty: 10 mL, Refills: 11      STOP taking these medications     Insulin Human (INSULIN PUMP) SOLN      insulin lispro (HUMALOG) 100 UNIT/ML injection        Allergies  Allergen Reactions  . Diclofenac Sodium Swelling and Other (See Comments)    Blurred vision  . Doxycycline Calcium Swelling and Other (See Comments)    Blurred vision  . Codeine Nausea And Vomiting and Other (See Comments)    Dizziness   . Zocor [Simvastatin] Other (See Comments)    Dizziness, weakness    Follow-up Information    Follow up with Cheral Almas, MD In 1 week.   Specialty:  Orthopedic Surgery   Why:  For wound re-check   Contact information:   [address] [place] [address] [phone]       Follow up with [name], MD.   Specialty:  Endocrinology   Contact information:   [address] [place] [address] [phone]        The results of significant diagnostics from this hospitalization (including imaging, microbiology, ancillary and laboratory) are listed below for reference.    Significant Diagnostic Studies: Dg Ankle 2 Views  Left  Jan 10, 2015   CLINICAL DATA:  Post reduction LEFT ankle  EXAM: LEFT ANKLE - 2 VIEW  COMPARISON:  2015-01-10  FINDINGS: Fiberglass splint material obscures bone detail.  Marked osseous demineralization.  Persistent significantly displaced and angulated bimalleolar fractures.  Persistent lateral tibiotalar dislocation, question impacted upon lateral margin of distal tibia.  No new bony abnormalities identified.  Scattered atherosclerotic calcification.  IMPRESSION: Displaced and angulated bimalleolar fractures little changed.  Persistent lateral tibiotalar dislocation as above.   Electronically Signed   By: Ulyses Southward M.D.   On: Jan 10, 2015 13:51   Dg Ankle Complete Left  12/25/2014   CLINICAL DATA:  ORIF LEFT ankle  EXAM: DG C-ARM 61-120 MIN; LEFT ANKLE COMPLETE - 3+ VIEW  COMPARISON:  Three digital C-arm fluoroscopic images obtained intraoperatively are compared to prior  study of 12/23/2014 and CT LEFT ankle of 12/24/2014  FINDINGS: Diffuse osseous demineralization.  Lateral plate and multiple screws with placed across a reduced fracture of the distal LEFT fibula.  Two cannulated screws placed across to reduced fracture of the medial malleolus.  Ankle mortise intact.  No additional fracture or dislocation identified.  IMPRESSION: Post bimalleolar ORIF LEFT ankle.   Electronically Signed   By: Ulyses Southward M.D.   On: 12/25/2014 17:52   Dg Ankle Complete Left  12/23/2014   CLINICAL DATA:  Larey Seat last night walking to the restroom. Pain and deformity.  EXAM: LEFT ANKLE COMPLETE - 3+ VIEW  COMPARISON:  None.  FINDINGS: Fracture dislocation at the ankle joint with an oblique fracture of the distal fibula and transverse fracture of the medial malleolus. Posterior lip of the tibia appears intact. Dislocation at the talar dome relative to the distal tibial articular surface with talar dome being laterally position and possibly impacted upon the lateral margin of the tibia.  IMPRESSION: Fracture dislocation of the  ankle joint as described above.   Electronically Signed   By: Paulina Fusi M.D.   On: 12/23/2014 11:05   Ct Ankle Left Wo Contrast  12/24/2014   CLINICAL DATA:  The patient caught her left foot in a wheelchair 12/21/2014 resulting and sustained a left ankle fracture. Pain. Initial encounter.  EXAM: CT OF THE LEFT ANKLE WITHOUT CONTRAST  TECHNIQUE: Multidetector CT imaging of the left ankle was performed according to the standard protocol. Multiplanar CT image reconstructions were also generated.  COMPARISON:  Plain films 12/23/2014.  FINDINGS: The patient has a fracture of the distal fibula originating in the lateral cortex 4.3 cm above the tip of the lateral malleolus. The fracture extends in an oblique and medial orientation through the distal diaphysis. There is approximately 1/2 shaft with lateral displacement.  A medial malleolar fracture is identified with approximately 0.5 cm lateral displacement. The patient also has a fracture of the posterior malleolus which demonstrates cephalad displacement of approximately 0.3 cm. This fracture fragment measures approximately 1.3 cm transverse by 0.6 cm AP at the plafond by 1 cm craniocaudal and is eccentric laterally. No other fracture is identified. Bones are osteopenic. 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  Soft tissue swelling and hematoma are seen about the ankle. No tendon entrapment is identified. All visualized tendons appear intact. No ligament tear is visualized by CT scan.  IMPRESSION: Trimalleolar fractures as described above.  Osteopenia.  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   Electronically Signed   By: Drusilla Kanner M.D.   On: 12/24/2014 08:22   Nm Myocar Multi W/spect W/wall Motion / Ef  12/15/2014   CLINICAL DATA:  Chest pain.  Coronary artery disease.  Diabetes.  EXAM: MYOCARDIAL IMAGING WITH SPECT (REST AND PHARMACOLOGIC-STRESS)  GATED LEFT VENTRICULAR WALL MOTION STUDY  LEFT VENTRICULAR EJECTION FRACTION  TECHNIQUE:  Standard myocardial SPECT imaging was performed after resting intravenous injection of 10 mCi Tc-22m sestamibi. Subsequently, intravenous infusion of Lexiscan was performed under the supervision of the Cardiology staff. At peak effect of the drug, 30 mCi Tc-43m sestamibi was injected intravenously and standard myocardial SPECT imaging was performed. Quantitative gated imaging was also performed to evaluate left ventricular wall motion, and estimate left ventricular ejection fraction.  COMPARISON:  None.  FINDINGS: Perfusion: No reversible left ventricular myocardial perfusion defects seen. A moderate fixed defect is seen involving the apical wall on both the stress and resting images. Given the absence of corresponding motion  abnormality, this favors apical thinning over apical wall infarction.  Wall Motion: Normal left ventricular wall motion. No left ventricular dilation.  Left Ventricular Ejection Fraction: 74 %  End diastolic volume 61 ml  End systolic volume 16 ml  IMPRESSION: 1. No reversible ischemia. Probable apical wall thinning, with apical wall infarction considered less likely.  2. Normal left ventricular wall motion.  3. Left ventricular ejection fraction 74%  4. Low-risk stress test findings*.  *2012 Appropriate Use Criteria for Coronary Revascularization Focused Update: J Am Coll Cardiol. 2012;59(9):857-881. http://content.dementiazones.com.aspx?articleid=1201161   Electronically Signed   By: Myles Rosenthal M.D.   On: 12/15/2014 13:20   Dg Chest Port 1 View  12/12/2014   CLINICAL DATA:  DKA  EXAM: PORTABLE CHEST - 1 VIEW  COMPARISON:  09/08/2011  FINDINGS: Mild cardiac enlargement. Right Coronary artery stent. Mild vascular congestion without edema.  Mild bibasilar atelectasis/ infiltrate.  No effusion.  IMPRESSION: Mild vascular congestion without edema  Mild bibasilar atelectasis/infiltrate.   Electronically Signed   By: Marlan Palau M.D.   On: 12/12/2014 13:30   Dg Ankle Left  Port  12/23/2014   CLINICAL DATA:  Post reduction  EXAM: PORTABLE LEFT ANKLE - 2 VIEW  COMPARISON:  Portable exam 1613 hours compared to earlier studies of 12/23/2014  FINDINGS: Osseous demineralization.  Previously identified lateral talar subluxation appear significantly improved.  Mildly displaced lateral malleolar fracture little changed.  Question mild medial displacement of media malleolar fragment versus artifact from superimposed plaster splint.  IMPRESSION: Improved tibiotalar alignment post reduction.  Mildly displaced bimalleolar fractures as above.   Electronically Signed   By: Ulyses Southward M.D.   On: 12/23/2014 16:23   Dg Ankle Left Port  12/23/2014   CLINICAL DATA:  Postreduction radiographs are in close reduction of left ankle fractures.  EXAM: PORTABLE LEFT ANKLE - 2 VIEW  COMPARISON:  12/23/2014 at 1327 hours and 1059 hours  FINDINGS: Improved fracture reduction since the earlier postreduction study.  The talus is now all partly reduced. It remains subluxed laterally in relation to the distal tibial articular surface, by approximately 7 mm. Fractures of the medial malleolus and distal fibula are displaced laterally by a similar degree.  IMPRESSION: Improved reduction of the bimalleolar ankle fracture. The displaced talus has been reduced, but remains subluxed laterally in relation to the distal tibia by 7 mm.   Electronically Signed   By: Amie Portland M.D.   On: 12/23/2014 15:00   Dg C-arm 1-60 Min  12/25/2014   CLINICAL DATA:  ORIF LEFT ankle  EXAM: DG C-ARM 61-120 MIN; LEFT ANKLE COMPLETE - 3+ VIEW  COMPARISON:  Three digital C-arm fluoroscopic images obtained intraoperatively are compared to prior study of 12/23/2014 and CT LEFT ankle of 12/24/2014  FINDINGS: Diffuse osseous demineralization.  Lateral plate and multiple screws with placed across a reduced fracture of the distal LEFT fibula.  Two cannulated screws placed across to reduced fracture of the medial malleolus.  Ankle mortise  intact.  No additional fracture or dislocation identified.  IMPRESSION: Post bimalleolar ORIF LEFT ankle.   Electronically Signed   By: Ulyses Southward M.D.   On: 12/25/2014 17:52    Microbiology: Recent Results (from the past 240 hour(s))  Urine culture     Status: None   Collection Time: 12/23/14 10:33 AM  Result Value Ref Range Status   Specimen Description URINE, CLEAN CATCH  Final   Special Requests Normal  Final   Culture   Final  MULTIPLE SPECIES PRESENT, SUGGEST RECOLLECTION IF CLINICALLY INDICATED   Report Status 12/24/2014 FINAL  Final     Labs: Basic Metabolic Panel:  Recent Labs Lab 12/23/14 1042 12/24/14 0335 12/26/14 0340  NA 137 134* 137  K 3.7 3.5 3.2*  CL 98* 102 104  CO2 24 22 21*  GLUCOSE 377* 173* 240*  BUN <5* 7 <5*  CREATININE 0.83 0.93 0.73  CALCIUM 9.6 9.0 8.9   Liver Function Tests: No results for input(s): AST, ALT, ALKPHOS, BILITOT, PROT, ALBUMIN in the last 168 hours. No results for input(s): LIPASE, AMYLASE in the last 168 hours. No results for input(s): AMMONIA in the last 168 hours. CBC:  Recent Labs Lab 12/23/14 1042 12/24/14 0335 12/26/14 0340  WBC 7.8 6.6 7.9  NEUTROABS 6.2  --   --   HGB 10.6* 8.7* 9.0*  HCT 32.2* 26.8* 27.5*  MCV 83.6 84.0 83.8  PLT 304 280 295   Cardiac Enzymes: No results for input(s): CKTOTAL, CKMB, CKMBINDEX, TROPONINI in the last 168 hours. BNP: BNP (last 3 results) No results for input(s): BNP in the last 8760 hours.  ProBNP (last 3 results) No results for input(s): PROBNP in the last 8760 hours.  CBG:  Recent Labs Lab 12/26/14 0810 12/26/14 1156 12/26/14 1724 12/26/14 2212 12/27/14 0810  GLUCAP 254* 175* 183* 168* 211*       Signed:  Shanetta Nicolls L  [facility] 12/27/2014, 9:56 AM

## 2014-12-27 NOTE — Progress Notes (Signed)
Patient gown changed. Patient changed into new depend. IV removed. Attempted to give report. No answer. Voicemail left on supervisors mailbox to call me back for report.

## 2014-12-28 LAB — TYPE AND SCREEN
ABO/RH(D): AB POS
Antibody Screen: NEGATIVE
UNIT DIVISION: 0
Unit division: 0

## 2014-12-29 LAB — ABO/RH: ABO/RH(D): AB POS

## 2014-12-30 ENCOUNTER — Non-Acute Institutional Stay (SKILLED_NURSING_FACILITY): Payer: Medicare Other | Admitting: Internal Medicine

## 2014-12-30 DIAGNOSIS — I10 Essential (primary) hypertension: Secondary | ICD-10-CM | POA: Diagnosis not present

## 2014-12-30 DIAGNOSIS — D62 Acute posthemorrhagic anemia: Secondary | ICD-10-CM

## 2014-12-30 DIAGNOSIS — E785 Hyperlipidemia, unspecified: Secondary | ICD-10-CM

## 2014-12-30 DIAGNOSIS — F322 Major depressive disorder, single episode, severe without psychotic features: Secondary | ICD-10-CM | POA: Diagnosis not present

## 2014-12-30 DIAGNOSIS — K219 Gastro-esophageal reflux disease without esophagitis: Secondary | ICD-10-CM | POA: Diagnosis not present

## 2014-12-30 DIAGNOSIS — S82892A Other fracture of left lower leg, initial encounter for closed fracture: Secondary | ICD-10-CM | POA: Diagnosis not present

## 2014-12-30 DIAGNOSIS — K5900 Constipation, unspecified: Secondary | ICD-10-CM

## 2014-12-30 DIAGNOSIS — E876 Hypokalemia: Secondary | ICD-10-CM | POA: Diagnosis not present

## 2014-12-30 DIAGNOSIS — E108 Type 1 diabetes mellitus with unspecified complications: Secondary | ICD-10-CM | POA: Diagnosis not present

## 2014-12-30 DIAGNOSIS — F329 Major depressive disorder, single episode, unspecified: Secondary | ICD-10-CM

## 2014-12-30 NOTE — Progress Notes (Signed)
Patient ID: Beth Payne, female   DOB: 17-Apr-1941, 74 y.o.   MRN: [id number]     [facility]   PCP: [name], MD  Code Status: DNR  Allergies  Allergen Reactions  . Diclofenac Sodium Swelling and Other (See Comments)    Blurred vision  . Doxycycline Calcium Swelling and Other (See Comments)    Blurred vision  . Codeine Nausea And Vomiting and Other (See Comments)    Dizziness   . Zocor [Simvastatin] Other (See Comments)    Dizziness, weakness     Chief Complaint  Patient presents with  . New Admit To SNF     HPI:  74 y.o. patient is here for short term rehabilitation post hospital admission from 12/23/14-12/27/14 post fall with left ankle fracture. She underwent open reduction and internal fixation on 12/25/14. She is seen in her room today. Her pain is under control with current pain regimen. Reviewed her cbg 214-328 with one reading of 174. She was taking insulin pump at home and with concerns for this pump not working, she is currently on lantus and SSI until follow up with Dr Evlyn Kanner, her endocrinologist. She is seen in her room today. She denies any concerns.  Review of Systems:  Constitutional: positive for fatigue. Negative for fever, chills, diaphoresis.  HENT: Negative for headache, congestion, nasal discharge Eyes: Negative for eye pain, blurred vision, double vision and discharge.  Respiratory: Negative for cough, shortness of breath and wheezing.   Cardiovascular: Negative for chest pain, palpitations, leg swelling.  Gastrointestinal: Negative for heartburn, nausea, vomiting, abdominal pain. Had bowel movement this am Genitourinary: Negative for dysuria, flank pain.  Musculoskeletal: Negative for back pain, falls Skin: Negative for itching, rash.  Neurological: Negative for dizziness, tingling, focal weakness Psychiatric/Behavioral: Negative for depression   Past Medical History  Diagnosis Date  . Coronary artery  disease   . Depression   . Retinopathy   . Anxiety   . Heart murmur   . TIA (transient ischemic attack)   . Arthritis     "fingers" (12/23/2014)  . Fibromyalgia   . Type I diabetes mellitus dx'd 1953    "I'm on a pup" (12/23/2014)  . Chronic lower back pain   . GERD (gastroesophageal reflux disease) dx'd 1995  . Osteopenia dx'd 2007   Past Surgical History  Procedure Laterality Date  . Cataract extraction w/ intraocular lens  implant, bilateral Bilateral 1998  . Tonsillectomy    . Appendectomy    . Bilateral carpal tunnel release Bilateral 2003-2004  . Back surgery    . Anterior cervical decomp/discectomy fusion  05/2003    "C5-6"  . Abdominal hysterectomy  1972  . Dilation and curettage of uterus    . Cesarean section    . Eye surgery Bilateral since 1977    "numerous slaser surgeries for retinopathy"  . Trigger finger release Bilateral 2003  . Cervical disc surgery  2004    "collapsed C-5"  . Fracture surgery    . Patella fracture surgery Left 04/2007  . Vertebroplasty  01/2008    "compressed lumbar fracture"  . Laparoscopic lysis of adhesions  1975-1987 X 5  . Exploratory laparotomy  5621-3086 X 1    "adhesions"  . Oophorectomy Bilateral 1980's  . Vitrectomy Right 08/1997  . Bunionectomy with hammertoe reconstruction Right 02/1999  . Coronary angioplasty with stent placement  07/1999    "?1"  . Lumbar microdiscectomy  03/2005  . Orif ankle fracture Left 12/25/2014  Procedure: OPEN REDUCTION INTERNAL FIXATION (ORIF) LEFT TRIMALLEOLAR ANKLE FRACTURE;  Surgeon: Tarry Kos, MD;  Location: [facility] OR;  Service: Orthopedics;  Laterality: Left;   Social History:   reports that she has been smoking Cigarettes.  She has a 27 pack-year smoking history. She has never used smokeless tobacco. She reports that she does not drink alcohol or use illicit drugs.  Family History  Problem Relation Age of Onset  . Liver disease Sister   . Colon cancer Brother     Medications:     Medication List       This list is accurate as of: 12/30/14 12:46 PM.  Always use your most recent med list.               acetaminophen 325 MG tablet  Commonly known as:  TYLENOL  Take 2 tablets (650 mg total) by mouth every 6 (six) hours as needed for mild pain (or Fever >/= 101).     ARIPiprazole 2 MG tablet  Commonly known as:  ABILIFY  Take 2 mg by mouth at bedtime.     aspirin 325 MG EC tablet  Take 1 tablet (325 mg total) by mouth 2 (two) times daily. For 6 weeks, then daily     cholecalciferol 1000 UNITS tablet  Commonly known as:  VITAMIN D  Take 2,000 Units by mouth daily.     docusate sodium 100 MG capsule  Commonly known as:  COLACE  Take 100 mg by mouth 2 (two) times daily.     HYDROcodone-acetaminophen 5-325 MG per tablet  Commonly known as:  NORCO  Take 1-2 tablets by mouth every 6 (six) hours as needed.     insulin aspart 100 UNIT/ML injection  Commonly known as:  novoLOG  Inject 0-6 Units into the skin 3 (three) times daily with meals. 121-150=0 units 151-200=1 unit 201-250=2 units  251-300=3 units 301-350=4 units 351-400=6 units >400 give 6 units and call MD     insulin glargine 100 UNIT/ML injection  Commonly known as:  LANTUS  Inject 0.08 mLs (8 Units total) into the skin daily.     multivitamin with minerals Tabs tablet  Take 1 tablet by mouth daily.     omeprazole 20 MG capsule  Commonly known as:  PRILOSEC  Take 40 mg by mouth daily.     polyethylene glycol packet  Commonly known as:  MIRALAX / GLYCOLAX  Take 17 g by mouth daily as needed for mild constipation.     rosuvastatin 5 MG tablet  Commonly known as:  CRESTOR  Take 5 mg by mouth daily.     sertraline 100 MG tablet  Commonly known as:  ZOLOFT  Take 150 mg by mouth daily.         Physical Exam: Filed Vitals:   12/30/14 1245  BP: 150/70  Pulse: 86  Temp: 99.4 F (37.4 C)  Resp: 16  SpO2: 95%    General- elderly female, well built, in no acute distress Head-  normocephalic, atraumatic Throat- moist mucus membrane Eyes- PERRLA, EOMI, no pallor, no icterus, no discharge, normal conjunctiva, normal sclera Neck- no cervical lymphadenopathy Cardiovascular- normal s1,s2, no murmurs, palpable dorsalis pedis and radial pulses, trace left leg edema Respiratory- bilateral clear to auscultation, no wheeze, no rhonchi, no crackles, no use of accessory muscles Abdomen- bowel sounds present, soft, non tender Musculoskeletal- able to move all 4 extremities, both shoulder ROM limited (chronic), NWB to LLE, left lower extremity in cast, ble to move  her toes and good capillary refill Neurological- no focal deficit, alert and oriented  Skin- warm and dry Psychiatry- normal mood and affect    Labs reviewed: Basic Metabolic Panel:  Recent Labs  16/10/96 1320  12/23/14 1042 12/24/14 0335 12/26/14 0340  NA 135  < > 137 134* 137  K 5.0  < > 3.7 3.5 3.2*  CL 103  < > 98* 102 104  CO2 14*  < > 24 22 21*  GLUCOSE 560*  < > 377* 173* 240*  BUN 27*  < > <5* 7 <5*  CREATININE 1.72*  < > 0.83 0.93 0.73  CALCIUM 9.6  < > 9.6 9.0 8.9  MG 1.9  --   --   --   --   PHOS 4.0  --   --   --   --   < > = values in this interval not displayed. Liver Function Tests:  Recent Labs  12/12/14 1010  AST 27  ALT 25  ALKPHOS 60  BILITOT 1.1  PROT 6.9  ALBUMIN 4.2    Recent Labs  12/12/14 1010  LIPASE 21*   No results for input(s): AMMONIA in the last 8760 hours. CBC:  Recent Labs  12/12/14 1010  12/23/14 1042 12/24/14 0335 12/26/14 0340  WBC 7.5  < > 7.8 6.6 7.9  NEUTROABS 6.2  --  6.2  --   --   HGB 10.4*  < > 10.6* 8.7* 9.0*  HCT 32.1*  < > 32.2* 26.8* 27.5*  MCV 85.4  < > 83.6 84.0 83.8  PLT 196  < > 304 280 295  < > = values in this interval not displayed. Cardiac Enzymes:  Recent Labs  12/13/14 1309 12/13/14 1827 12/14/14 0007  TROPONINI 0.85* 0.79* 0.67*   BNP: Invalid input(s): POCBNP CBG:  Recent Labs  12/26/14 2212  12/27/14 0810 12/27/14 1223  GLUCAP 168* 211* 255*    Radiological Exams: Dg Ankle 2 Views Left  12/23/2014   CLINICAL DATA:  Post reduction LEFT ankle  EXAM: LEFT ANKLE - 2 VIEW  COMPARISON:  12/23/2014  FINDINGS: Fiberglass splint material obscures bone detail.  Marked osseous demineralization.  Persistent significantly displaced and angulated bimalleolar fractures.  Persistent lateral tibiotalar dislocation, question impacted upon lateral margin of distal tibia.  No new bony abnormalities identified.  Scattered atherosclerotic calcification.  IMPRESSION: Displaced and angulated bimalleolar fractures little changed.  Persistent lateral tibiotalar dislocation as above.   Electronically Signed   By: Ulyses Southward M.D.   On: 12/23/2014 13:51   Dg Ankle Complete Left  12/23/2014   CLINICAL DATA:  Larey Seat last night walking to the restroom. Pain and deformity.  EXAM: LEFT ANKLE COMPLETE - 3+ VIEW  COMPARISON:  None.  FINDINGS: Fracture dislocation at the ankle joint with an oblique fracture of the distal fibula and transverse fracture of the medial malleolus. Posterior lip of the tibia appears intact. Dislocation at the talar dome relative to the distal tibial articular surface with talar dome being laterally position and possibly impacted upon the lateral margin of the tibia.  IMPRESSION: Fracture dislocation of the ankle joint as described above.   Electronically Signed   By: Paulina Fusi M.D.   On: 12/23/2014 11:05   Ct Ankle Left Wo Contrast  12/24/2014   CLINICAL DATA:  The patient caught her left foot in a wheelchair 12/21/2014 resulting and sustained a left ankle fracture. Pain. Initial encounter.  EXAM: CT OF THE LEFT ANKLE WITHOUT CONTRAST  TECHNIQUE:  Multidetector CT imaging of the left ankle was performed according to the standard protocol. Multiplanar CT image reconstructions were also generated.  COMPARISON:  Plain films 12/23/2014.  FINDINGS: The patient has a fracture of the distal fibula  originating in the lateral cortex 4.3 cm above the tip of the lateral malleolus. The fracture extends in an oblique and medial orientation through the distal diaphysis. There is approximately 1/2 shaft with lateral displacement.  A medial malleolar fracture is identified with approximately 0.5 cm lateral displacement. The patient also has a fracture of the posterior malleolus which demonstrates cephalad displacement of approximately 0.3 cm. This fracture fragment measures approximately 1.3 cm transverse by 0.6 cm AP at the plafond by 1 cm craniocaudal and is eccentric laterally. No other fracture is identified. Bones are osteopenic. 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  Soft tissue swelling and hematoma are seen about the ankle. No tendon entrapment is identified. All visualized tendons appear intact. No ligament tear is visualized by CT scan.  IMPRESSION: Trimalleolar fractures as described above.  Osteopenia.  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   Electronically Signed   By: Drusilla Kanner M.D.   On: 12/24/2014 08:22   Dg Ankle Left Port  12/23/2014   CLINICAL DATA:  Post reduction  EXAM: PORTABLE LEFT ANKLE - 2 VIEW  COMPARISON:  Portable exam 1613 hours compared to earlier studies of 12/23/2014  FINDINGS: Osseous demineralization.  Previously identified lateral talar subluxation appear significantly improved.  Mildly displaced lateral malleolar fracture little changed.  Question mild medial displacement of media malleolar fragment versus artifact from superimposed plaster splint.  IMPRESSION: Improved tibiotalar alignment post reduction.  Mildly displaced bimalleolar fractures as above.   Electronically Signed   By: Ulyses Southward M.D.   On: 12/23/2014 16:23   Dg Ankle Left Port  12/23/2014   CLINICAL DATA:  Postreduction radiographs are in close reduction of left ankle fractures.  EXAM: PORTABLE LEFT ANKLE - 2 VIEW  COMPARISON:  12/23/2014 at 1327 hours and 1059 hours  FINDINGS: Improved  fracture reduction since the earlier postreduction study.  The talus is now all partly reduced. It remains subluxed laterally in relation to the distal tibial articular surface, by approximately 7 mm. Fractures of the medial malleolus and distal fibula are displaced laterally by a similar degree.  IMPRESSION: Improved reduction of the bimalleolar ankle fracture. The displaced talus has been reduced, but remains subluxed laterally in relation to the distal tibia by 7 mm.   Electronically Signed   By: Amie Portland M.D.   On: 12/23/2014 15:00    Assessment/Plan  Left ankle fracture S/p ORIF. NWB to LLE. Has follow up with orthopedics. Will have patient work with PT/OT as tolerated to regain strength and restore function.  Fall precautions are in place. Continue norco 5-325 mg 1-2 tab q6h prn pain. Continue aspirin 325 mg bid for dvt prophylaxis. Continue vitamin d supplement.  Blood loss anemia Post op, check h&h Lab Results  Component Value Date   HCT 27.5* 12/26/2014    Hypokalemia Check bmp for now and add kcl supplement if needed  Constipation Continue colace 100 mg bid and miralax daily prn, hydration encouraged  Dm type 1 uncontrolled Lab Results  Component Value Date   HGBA1C 6.6* 12/12/2014   Elevated cbg, was on insulin pump before. Will need appointment with Dr Evlyn Kanner for this. At present on lantus 8 u daily and SSI. Increase lantus to 12 u for now. Continue statin and aspirin  Hypertension  No known hx of HTN, elevated bp reading with 168/74, 150/70, 190/79. Start lisinopril 5 mg daily, monitor bp q shift and reassess  gerd Stable, continue prilosec 40 mg daily  HLD Continue crestor 5 mg daily  Chronic depression Mood stable, continue zoloft 150 mg daily and abilify 2 mg daily   Goals of care: short term rehabilitation   Labs/tests ordered: cbc, bmp  Family/ staff Communication: reviewed care plan with patient and nursing supervisor    Oneal Grout,  MD  [facility] [phone] (Monday-Friday 8 am - 5 pm) [phone] (afterhours)

## 2014-12-31 ENCOUNTER — Encounter (HOSPITAL_COMMUNITY): Payer: Self-pay

## 2014-12-31 ENCOUNTER — Observation Stay (HOSPITAL_COMMUNITY)
Admission: EM | Admit: 2014-12-31 | Discharge: 2015-01-01 | Disposition: A | Discharge status: Skilled Nursing Facility | Payer: Medicare Other | Attending: Internal Medicine | Admitting: Internal Medicine

## 2014-12-31 ENCOUNTER — Emergency Department (HOSPITAL_COMMUNITY): Payer: Medicare Other

## 2014-12-31 DIAGNOSIS — F17210 Nicotine dependence, cigarettes, uncomplicated: Secondary | ICD-10-CM | POA: Insufficient documentation

## 2014-12-31 DIAGNOSIS — M797 Fibromyalgia: Secondary | ICD-10-CM | POA: Insufficient documentation

## 2014-12-31 DIAGNOSIS — Z8673 Personal history of transient ischemic attack (TIA), and cerebral infarction without residual deficits: Secondary | ICD-10-CM | POA: Insufficient documentation

## 2014-12-31 DIAGNOSIS — E1051 Type 1 diabetes mellitus with diabetic peripheral angiopathy without gangrene: Secondary | ICD-10-CM | POA: Diagnosis present

## 2014-12-31 DIAGNOSIS — G9340 Encephalopathy, unspecified: Principal | ICD-10-CM | POA: Insufficient documentation

## 2014-12-31 DIAGNOSIS — D649 Anemia, unspecified: Secondary | ICD-10-CM | POA: Insufficient documentation

## 2014-12-31 DIAGNOSIS — E860 Dehydration: Secondary | ICD-10-CM | POA: Diagnosis not present

## 2014-12-31 DIAGNOSIS — I2510 Atherosclerotic heart disease of native coronary artery without angina pectoris: Secondary | ICD-10-CM | POA: Insufficient documentation

## 2014-12-31 DIAGNOSIS — S82892D Other fracture of left lower leg, subsequent encounter for closed fracture with routine healing: Secondary | ICD-10-CM | POA: Insufficient documentation

## 2014-12-31 DIAGNOSIS — S82899A Other fracture of unspecified lower leg, initial encounter for closed fracture: Secondary | ICD-10-CM | POA: Diagnosis present

## 2014-12-31 DIAGNOSIS — R410 Disorientation, unspecified: Secondary | ICD-10-CM

## 2014-12-31 DIAGNOSIS — E1065 Type 1 diabetes mellitus with hyperglycemia: Secondary | ICD-10-CM | POA: Insufficient documentation

## 2014-12-31 DIAGNOSIS — X58XXXD Exposure to other specified factors, subsequent encounter: Secondary | ICD-10-CM | POA: Insufficient documentation

## 2014-12-31 DIAGNOSIS — R4182 Altered mental status, unspecified: Secondary | ICD-10-CM | POA: Diagnosis not present

## 2014-12-31 DIAGNOSIS — E108 Type 1 diabetes mellitus with unspecified complications: Secondary | ICD-10-CM

## 2014-12-31 DIAGNOSIS — R739 Hyperglycemia, unspecified: Secondary | ICD-10-CM

## 2014-12-31 DIAGNOSIS — R0989 Other specified symptoms and signs involving the circulatory and respiratory systems: Secondary | ICD-10-CM | POA: Diagnosis not present

## 2014-12-31 DIAGNOSIS — I10 Essential (primary) hypertension: Secondary | ICD-10-CM | POA: Insufficient documentation

## 2014-12-31 DIAGNOSIS — Z66 Do not resuscitate: Secondary | ICD-10-CM | POA: Insufficient documentation

## 2014-12-31 DIAGNOSIS — J9811 Atelectasis: Secondary | ICD-10-CM | POA: Diagnosis present

## 2014-12-31 LAB — URINE MICROSCOPIC-ADD ON

## 2014-12-31 LAB — I-STAT VENOUS BLOOD GAS, ED
Acid-base deficit: 2 mmol/L (ref 0.0–2.0)
Bicarbonate: 22.5 mEq/L (ref 20.0–24.0)
O2 Saturation: 52 %
PH VEN: 7.386 — AB (ref 7.250–7.300)
TCO2: 24 mmol/L (ref 0–100)
pCO2, Ven: 37.6 mmHg — ABNORMAL LOW (ref 45.0–50.0)
pO2, Ven: 28 mmHg — CL (ref 30.0–45.0)

## 2014-12-31 LAB — URINALYSIS, ROUTINE W REFLEX MICROSCOPIC
HGB URINE DIPSTICK: NEGATIVE
Leukocytes, UA: NEGATIVE
Nitrite: NEGATIVE
Protein, ur: NEGATIVE mg/dL
SPECIFIC GRAVITY, URINE: 1.017 (ref 1.005–1.030)
Urobilinogen, UA: 0.2 mg/dL (ref 0.0–1.0)
pH: 5 (ref 5.0–8.0)

## 2014-12-31 LAB — CBC
HCT: 25.5 % — ABNORMAL LOW (ref 36.0–46.0)
Hemoglobin: 8.6 g/dL — ABNORMAL LOW (ref 12.0–15.0)
MCH: 27.4 pg (ref 26.0–34.0)
MCHC: 33.7 g/dL (ref 30.0–36.0)
MCV: 81.2 fL (ref 78.0–100.0)
Platelets: 447 10*3/uL — ABNORMAL HIGH (ref 150–400)
RBC: 3.14 MIL/uL — ABNORMAL LOW (ref 3.87–5.11)
RDW: 15.1 % (ref 11.5–15.5)
WBC: 7.7 10*3/uL (ref 4.0–10.5)

## 2014-12-31 LAB — COMPREHENSIVE METABOLIC PANEL
ALT: 15 U/L (ref 14–54)
AST: 29 U/L (ref 15–41)
Albumin: 3.1 g/dL — ABNORMAL LOW (ref 3.5–5.0)
Alkaline Phosphatase: 70 U/L (ref 38–126)
Anion gap: 14 (ref 5–15)
BUN: 24 mg/dL — AB (ref 6–20)
CHLORIDE: 101 mmol/L (ref 101–111)
CO2: 22 mmol/L (ref 22–32)
Calcium: 9.9 mg/dL (ref 8.9–10.3)
Creatinine, Ser: 1.11 mg/dL — ABNORMAL HIGH (ref 0.44–1.00)
GFR calc Af Amer: 56 mL/min — ABNORMAL LOW (ref >60)
GFR, EST NON AFRICAN AMERICAN: 48 mL/min — AB (ref >60)
Glucose, Bld: 200 mg/dL — ABNORMAL HIGH (ref 65–99)
POTASSIUM: 3.2 mmol/L — AB (ref 3.5–5.1)
SODIUM: 137 mmol/L (ref 135–145)
Total Bilirubin: 0.4 mg/dL (ref 0.3–1.2)
Total Protein: 5.9 g/dL — ABNORMAL LOW (ref 6.5–8.1)

## 2014-12-31 LAB — AMMONIA: AMMONIA: 17 umol/L (ref 9–35)

## 2014-12-31 LAB — I-STAT CG4 LACTIC ACID, ED: LACTIC ACID, VENOUS: 2.64 mmol/L — AB (ref 0.5–2.0)

## 2014-12-31 MED ORDER — SODIUM CHLORIDE 0.9 % IV BOLUS (SEPSIS)
1000.0000 mL | Freq: Once | INTRAVENOUS | Status: AC
  Administered 2014-12-31: 1000 mL via INTRAVENOUS

## 2014-12-31 MED ORDER — SODIUM CHLORIDE 0.9 % IV BOLUS (SEPSIS)
1000.0000 mL | Freq: Once | INTRAVENOUS | Status: AC
  Administered 2015-01-01: 1000 mL via INTRAVENOUS

## 2014-12-31 NOTE — ED Notes (Signed)
Patient returned from radiology

## 2014-12-31 NOTE — ED Notes (Signed)
Pt here by ems from [facility], reports hyperglycemia over 600 and altered mental status, with ems cbg 291, pt initially had complaint of sob but on arrival here denies any complaints, recent fracture to left lower extremity.

## 2014-12-31 NOTE — ED Notes (Signed)
Pt more confused, attempting to get out of bed, reports unable to "move my leg." pt referring to L leg that is splinted. Assisted pt back to bed.

## 2014-12-31 NOTE — ED Provider Notes (Signed)
CSN: [id number]     Arrival date & time 12/31/14  2034 History   First MD Initiated Contact with Patient 12/31/14 2041     Chief Complaint  Patient presents with  . Altered Mental Status  . Hyperglycemia   (Consider location/radiation/quality/duration/timing/severity/associated sxs/prior Treatment)  Patient is a 74 y.o. female presenting with altered mental status. The history is provided by the patient, the EMS personnel and a relative. The history is limited by the condition of the patient. No language interpreter was used.  Altered Mental Status Presenting symptoms: confusion and disorientation   Severity:  Severe Most recent episode:  More than 2 days ago Episode history:  Continuous Timing:  Constant Progression:  Worsening Chronicity:  New Context comment:  Recent left ankle fracture/surgery and rehab patient Associated symptoms: no fever     Past Medical History  Diagnosis Date  . Coronary artery disease   . Depression   . Retinopathy   . Anxiety   . Heart murmur   . TIA (transient ischemic attack)   . Arthritis     "fingers" (12/23/2014)  . Fibromyalgia   . Type I diabetes mellitus dx'd 1953    "I'm on a pup" (12/23/2014)  . Chronic lower back pain   . GERD (gastroesophageal reflux disease) dx'd 1995  . Osteopenia dx'd 2007   Past Surgical History  Procedure Laterality Date  . Cataract extraction w/ intraocular lens  implant, bilateral Bilateral 1998  . Tonsillectomy    . Appendectomy    . Bilateral carpal tunnel release Bilateral 2003-2004  . Back surgery    . Anterior cervical decomp/discectomy fusion  05/2003    "C5-6"  . Abdominal hysterectomy  1972  . Dilation and curettage of uterus    . Cesarean section    . Eye surgery Bilateral since 1977    "numerous slaser surgeries for retinopathy"  . Trigger finger release Bilateral 2003  . Cervical disc surgery  2004    "collapsed C-5"  . Fracture surgery    . Patella fracture surgery Left 04/2007  .  Vertebroplasty  01/2008    "compressed lumbar fracture"  . Laparoscopic lysis of adhesions  1975-1987 X 5  . Exploratory laparotomy  7829-5621 X 1    "adhesions"  . Oophorectomy Bilateral 1980's  . Vitrectomy Right 08/1997  . Bunionectomy with hammertoe reconstruction Right 02/1999  . Coronary angioplasty with stent placement  07/1999    "?1"  . Lumbar microdiscectomy  03/2005  . Orif ankle fracture Left 12/25/2014    Procedure: OPEN REDUCTION INTERNAL FIXATION (ORIF) LEFT TRIMALLEOLAR ANKLE FRACTURE;  Surgeon: Tarry Kos, MD;  Location: [facility] OR;  Service: Orthopedics;  Laterality: Left;   Family History  Problem Relation Age of Onset  . Liver disease Sister   . Colon cancer Brother    History  Substance Use Topics  . Smoking status: Current Every Day Smoker -- 1.00 packs/day for 27 years    Types: Cigarettes  . Smokeless tobacco: Never Used     Comment: "quit smoking cigarettes in 1985  . Alcohol Use: No   OB History    No data available     Review of Systems  Unable to perform ROS: Mental status change  Constitutional: Negative for fever.  Musculoskeletal: Positive for gait problem (2/2 left ankle fracture).  Psychiatric/Behavioral: Positive for confusion.      Allergies  Diclofenac sodium; Doxycycline calcium; Codeine; and Zocor  Home Medications   Prior to Admission medications   Medication Sig  Start Date End Date Taking? Authorizing Provider  acetaminophen (TYLENOL) 325 MG tablet Take 2 tablets (650 mg total) by mouth every 6 (six) hours as needed for mild pain (or Fever >/= 101). 12/27/14   Christiane Ha, MD  ARIPiprazole (ABILIFY) 2 MG tablet Take 2 mg by mouth at bedtime.     Historical Provider, MD  aspirin 325 MG EC tablet Take 1 tablet (325 mg total) by mouth 2 (two) times daily. For 6 weeks, then daily 12/27/14   Christiane Ha, MD  cholecalciferol (VITAMIN D) 1000 UNITS tablet Take 2,000 Units by mouth daily.    Historical Provider, MD  docusate  sodium (COLACE) 100 MG capsule Take 100 mg by mouth 2 (two) times daily.    Historical Provider, MD  HYDROcodone-acetaminophen (NORCO) 5-325 MG per tablet Take 1-2 tablets by mouth every 6 (six) hours as needed. 12/25/14   Tarry Kos, MD  insulin aspart (NOVOLOG) 100 UNIT/ML injection Inject 0-6 Units into the skin 3 (three) times daily with meals. 121-150=0 units 151-200=1 unit 201-250=2 units  251-300=3 units 301-350=4 units 351-400=6 units >400 give 6 units and call MD 12/15/14   Starleen Arms, MD  insulin glargine (LANTUS) 100 UNIT/ML injection Inject 0.08 mLs (8 Units total) into the skin daily. 12/27/14   Christiane Ha, MD  Multiple Vitamin (MULITIVITAMIN WITH MINERALS) TABS Take 1 tablet by mouth daily.    Historical Provider, MD  omeprazole (PRILOSEC) 20 MG capsule Take 40 mg by mouth daily.    Historical Provider, MD  polyethylene glycol (MIRALAX / GLYCOLAX) packet Take 17 g by mouth daily as needed for mild constipation. 12/27/14   Christiane Ha, MD  rosuvastatin (CRESTOR) 5 MG tablet Take 5 mg by mouth daily.    Historical Provider, MD  sertraline (ZOLOFT) 100 MG tablet Take 150 mg by mouth daily.    Historical Provider, MD   BP 117/48 mmHg  Pulse 91  Temp(Src) 98.6 F (37 C) (Oral)  Resp 20  SpO2 100%   Physical Exam  Constitutional: Vital signs are normal. She appears well-developed and well-nourished. She does not appear ill. No distress.  HENT:  Head: Normocephalic and atraumatic.  Nose: Nose normal.  Mouth/Throat: Oropharynx is clear and moist. No oropharyngeal exudate.  Eyes: EOM are normal. Pupils are equal, round, and reactive to light.  Neck: Normal range of motion. Neck supple.  Cardiovascular: Normal rate, regular rhythm, normal heart sounds and intact distal pulses.   No murmur heard. Pulmonary/Chest: Effort normal and breath sounds normal. No respiratory distress. She has no wheezes. She exhibits no tenderness.  Abdominal: Soft. There is no  tenderness. There is no rebound and no guarding.  Musculoskeletal: Normal range of motion. She exhibits no tenderness.  Left leg in short leg splint.  Able to wiggle toes, good cap refill  Lymphadenopathy:    She has no cervical adenopathy.  Neurological: She is alert. No cranial nerve deficit. Coordination normal.  Awake and alert.  Oriented to person only.  Answers questions with 1-2 word answers only, unable to provide history.  Moving all 4 extremities spontaneously  Skin: Skin is warm and dry. She is not diaphoretic.  Nursing note and vitals reviewed.   ED Course  Procedures (including critical care time) Labs Review Labs Reviewed  COMPREHENSIVE METABOLIC PANEL - Abnormal; Notable for the following:    Potassium 3.2 (*)    Glucose, Bld 200 (*)    BUN 24 (*)    Creatinine, Ser  1.11 (*)    Total Protein 5.9 (*)    Albumin 3.1 (*)    GFR calc non Af Amer 48 (*)    GFR calc Af Amer 56 (*)    All other components within normal limits  CBC - Abnormal; Notable for the following:    RBC 3.14 (*)    Hemoglobin 8.6 (*)    HCT 25.5 (*)    Platelets 447 (*)    All other components within normal limits  URINALYSIS, ROUTINE W REFLEX MICROSCOPIC (NOT AT [facility]) - Abnormal; Notable for the following:    Glucose, UA >1000 (*)    Bilirubin Urine MODERATE (*)    Ketones, ur >80 (*)    All other components within normal limits  URINE MICROSCOPIC-ADD ON - Abnormal; Notable for the following:    Squamous Epithelial / LPF FEW (*)    All other components within normal limits  CBG MONITORING, ED - Abnormal; Notable for the following:    Glucose-Capillary 61 (*)    All other components within normal limits  I-STAT CG4 LACTIC ACID, ED - Abnormal; Notable for the following:    Lactic Acid, Venous 2.64 (*)    All other components within normal limits  I-STAT VENOUS BLOOD GAS, ED - Abnormal; Notable for the following:    pH, Ven 7.386 (*)    pCO2, Ven 37.6 (*)    pO2, Ven 28.0 (*)    All other  components within normal limits  URINE CULTURE  AMMONIA  BLOOD GAS, VENOUS    Imaging Review Ct Head Wo Contrast  12/31/2014   CLINICAL DATA:  Altered mental status.  EXAM: CT HEAD WITHOUT CONTRAST  TECHNIQUE: Contiguous axial images were obtained from the base of the skull through the vertex without intravenous contrast.  COMPARISON:  06/27/2009  FINDINGS: There is no intracranial hemorrhage, mass or evidence of acute infarction. There is moderate generalized atrophy. There is moderate periventricular hypodensity consistent with chronic small vessel ischemic disease. The atrophy and white matter hypodensities are worsened from 06/27/2009. No focal brain abnormalities are evident. No bony abnormality is evident. The visible paranasal sinuses are clear.  IMPRESSION: Moderate generalized atrophy and chronic small vessel ischemic disease. No acute findings.   Electronically Signed   By: Ellery Plunk M.D.   On: 12/31/2014 23:54   Dg Chest Portable 1 View  12/31/2014   CLINICAL DATA:  Altered mental status.  EXAM: PORTABLE CHEST - 1 VIEW  COMPARISON:  12/12/2014  FINDINGS: Patient's chin obscures the right greater than left lung apex. Lung volumes are low. There is unchanged cardiomegaly and vascular congestion. Bibasilar opacities are again seen, improved on the right and mildly worsened on the left. No large pleural effusion or pneumothorax.  IMPRESSION: 1. Hypoventilatory chest with unchanged cardiomegaly and vascular congestion. 2. Worsening left basilar aeration, may reflect atelectasis, aspiration, or pneumonia. Mildly improved right basilar aeration. Shifting pattern favors atelectasis.   Electronically Signed   By: Rubye Oaks M.D.   On: 12/31/2014 21:12     EKG Interpretation   Date/Time:  Tuesday December 31 2014 20:38:32 EDT Ventricular Rate:  94 PR Interval:    QRS Duration: 92 QT Interval:  437 QTC Calculation: 546 R Axis:   92 Text Interpretation:  Atrial fibrillation Right  axis deviation Borderline  low voltage, extremity leads Nonspecific T abnrm, anterolateral leads  Prolonged QT interval Baseline wander in lead(s) V3 Atrial fibrillation  Artifact T wave abnormality Abnormal ekg Confirmed by Gerhard Munch  MD  [phone]) on 12/31/2014 9:01:35 PM      MDM   Final diagnoses:  Confusion  Hyperglycemia  Dehydration   Pt is a 74 yo F with hx of poorly controlled DM1, CAD, and recent left trimalleolar ankle fracture s/p ORIF with Dr. Roda Shutters on 7/20 who presents from rehab facility due to altered mental status and hyperglycemia.  Home regimen has been increased since her recent hospital discharge.  Takes lantus 12 units subQ daily and sliding scale with novolog TID with meals.  SNF has been watching closely and was concerned for elevated glucose as well as increasing confusion.  Previously was alert/oriented and able to take care of ADLs independently prior to initial hospitalization.  Family concerned that she has been progressively more confused over the past few days.  No hx of dementia.   OSH papers show glucose 661 at 0630 this AM. Given 2 x novolog 10 units subQ (at 1000 and 1300) and repeat glucose was 373 at 1900.    Oriented to person only.  Unable to provide any history.   Left ankle in splint, able to wiggle toes and with 2 second cap refill.  Normal work of breathing, no respiratory distress.  Not consistent with narcotic overdose.    Will work up with labs to evaluate for DKA and confusion.  CT head and CXR ordered.  Broad labs to evaluate.    Found to be dehydrated and with hyperglycemia but not in DKA. Has received 1L NS bolus and will give a 2nd.   CT head with no acute pathology noted.  She has been afebrile throughout.  CXR clear.  Urine clean.  No clear source for her altered mental status.  Could be delirium 2/2 recent trimalleolar fracture and hospitalization.  Does not appear to be 2/2 hyperglycemia.  Exam not consistent with encephalopathy as pt  is awake and interactive but disoriented. No meningeal signs.    Will admit for dehydration and altered mental status and continued evaluation to the cause.  Spoke to Dr. Julian Reil with hospitalist team for admission.    If performed, labs, EKGs, and imaging were reviewed and interpreted by myself and my attending, and incorporated in the medical decision making.  Patient was seen with ED Attending, Dr. Derrick Ravel, MD  Lenell Antu, MD 01/01/15 0981  Gerhard Munch, MD 01/03/15 1020

## 2014-12-31 NOTE — ED Notes (Signed)
Pt from [facility] c/o altered mental status. Staff reported hyperglycemia, cbg >600 and increased confusion. Family at bedside reports confusion first noted today. Recently in [facility] for rehab for increased weakness. During stay at snf, pt had a fall with fx to L ankle per family; was then discharged to [facility]. L ankle in cast, family reports worsening swelling to toes today. Pt alert to self only

## 2015-01-01 ENCOUNTER — Encounter (HOSPITAL_COMMUNITY): Payer: Self-pay | Admitting: General Practice

## 2015-01-01 DIAGNOSIS — R410 Disorientation, unspecified: Secondary | ICD-10-CM

## 2015-01-01 DIAGNOSIS — J9811 Atelectasis: Secondary | ICD-10-CM | POA: Diagnosis not present

## 2015-01-01 DIAGNOSIS — S82892K Other fracture of left lower leg, subsequent encounter for closed fracture with nonunion: Secondary | ICD-10-CM

## 2015-01-01 DIAGNOSIS — E108 Type 1 diabetes mellitus with unspecified complications: Secondary | ICD-10-CM

## 2015-01-01 DIAGNOSIS — S82892D Other fracture of left lower leg, subsequent encounter for closed fracture with routine healing: Secondary | ICD-10-CM

## 2015-01-01 DIAGNOSIS — R739 Hyperglycemia, unspecified: Secondary | ICD-10-CM

## 2015-01-01 DIAGNOSIS — D649 Anemia, unspecified: Secondary | ICD-10-CM

## 2015-01-01 DIAGNOSIS — G9340 Encephalopathy, unspecified: Secondary | ICD-10-CM | POA: Diagnosis not present

## 2015-01-01 LAB — BASIC METABOLIC PANEL
ANION GAP: 10 (ref 5–15)
BUN: 21 mg/dL — AB (ref 6–20)
CALCIUM: 9.3 mg/dL (ref 8.9–10.3)
CO2: 22 mmol/L (ref 22–32)
Chloride: 106 mmol/L (ref 101–111)
Creatinine, Ser: 0.83 mg/dL (ref 0.44–1.00)
GFR calc Af Amer: >60 mL/min (ref >60)
GFR calc non Af Amer: >60 mL/min (ref >60)
Glucose, Bld: 146 mg/dL — ABNORMAL HIGH (ref 65–99)
Potassium: 3.4 mmol/L — ABNORMAL LOW (ref 3.5–5.1)
Sodium: 138 mmol/L (ref 135–145)

## 2015-01-01 LAB — CBC
HCT: 25.8 % — ABNORMAL LOW (ref 36.0–46.0)
Hemoglobin: 9 g/dL — ABNORMAL LOW (ref 12.0–15.0)
MCH: 28.1 pg (ref 26.0–34.0)
MCHC: 34.9 g/dL (ref 30.0–36.0)
MCV: 80.6 fL (ref 78.0–100.0)
PLATELETS: 439 10*3/uL — AB (ref 150–400)
RBC: 3.2 MIL/uL — ABNORMAL LOW (ref 3.87–5.11)
RDW: 15.1 % (ref 11.5–15.5)
WBC: 11.2 10*3/uL — ABNORMAL HIGH (ref 4.0–10.5)

## 2015-01-01 LAB — GLUCOSE, CAPILLARY
GLUCOSE-CAPILLARY: 140 mg/dL — AB (ref 65–99)
Glucose-Capillary: 197 mg/dL — ABNORMAL HIGH (ref 65–99)
Glucose-Capillary: 78 mg/dL (ref 65–99)
Glucose-Capillary: 152 mg/dL — ABNORMAL HIGH (ref 65–99)
Glucose-Capillary: 133 mg/dL — ABNORMAL HIGH (ref 65–99)
Glucose-Capillary: 223 mg/dL — ABNORMAL HIGH (ref 65–99)
Glucose-Capillary: 241 mg/dL — ABNORMAL HIGH (ref 65–99)
Glucose-Capillary: 224 mg/dL — ABNORMAL HIGH (ref 65–99)

## 2015-01-01 LAB — MAGNESIUM: Magnesium: 1.7 mg/dL (ref 1.7–2.4)

## 2015-01-01 LAB — CBG MONITORING, ED
GLUCOSE-CAPILLARY: 61 mg/dL — AB (ref 65–99)
Glucose-Capillary: 92 mg/dL (ref 65–99)

## 2015-01-01 MED ORDER — ENOXAPARIN SODIUM 40 MG/0.4ML ~~LOC~~ SOLN: 40.0000 mg | SUBCUTANEOUS | Status: DC

## 2015-01-01 MED ORDER — PANTOPRAZOLE SODIUM 40 MG PO TBEC
40.0000 mg | DELAYED_RELEASE_TABLET | Freq: Every day | ORAL | Status: DC
Start: 2015-01-01 — End: 2015-01-01
  Administered 2015-01-01: 40 mg via ORAL
  Filled 2015-01-01: qty 1

## 2015-01-01 MED ORDER — AMLODIPINE BESYLATE 5 MG PO TABS: 5.0000 mg | ORAL_TABLET | Freq: Every day | ORAL | Status: DC

## 2015-01-01 MED ORDER — HEPARIN SODIUM (PORCINE) 5000 UNIT/ML IJ SOLN
5000.0000 [IU] | Freq: Three times a day (TID) | INTRAMUSCULAR | Status: DC
  Administered 2015-01-01 (×2): 5000 [IU] via SUBCUTANEOUS
  Filled 2015-01-01 (×4): qty 1

## 2015-01-01 MED ORDER — INSULIN GLARGINE 100 UNIT/ML ~~LOC~~ SOLN
12.0000 [IU] | Freq: Every day | SUBCUTANEOUS | Status: DC
  Administered 2015-01-01: 12 [IU] via SUBCUTANEOUS
  Filled 2015-01-01: qty 0.12

## 2015-01-01 MED ORDER — INSULIN ASPART 100 UNIT/ML ~~LOC~~ SOLN
0.0000 [IU] | Freq: Three times a day (TID) | SUBCUTANEOUS | Status: DC
  Administered 2015-01-01: 1 [IU] via SUBCUTANEOUS
  Administered 2015-01-01: 3 [IU] via SUBCUTANEOUS

## 2015-01-01 MED ORDER — ACETAMINOPHEN-CODEINE #3 300-30 MG PO TABS: 1.0000 | ORAL_TABLET | ORAL | Status: DC | PRN

## 2015-01-01 MED ORDER — MAGNESIUM SULFATE 2 GM/50ML IV SOLN
2.0000 g | Freq: Once | INTRAVENOUS | Status: AC
  Administered 2015-01-01: 2 g via INTRAVENOUS
  Filled 2015-01-01: qty 50

## 2015-01-01 MED ORDER — DOCUSATE SODIUM 100 MG PO CAPS
100.0000 mg | ORAL_CAPSULE | Freq: Two times a day (BID) | ORAL | Status: DC
  Administered 2015-01-01: 100 mg via ORAL
  Filled 2015-01-01 (×2): qty 1

## 2015-01-01 MED ORDER — ASPIRIN EC 325 MG PO TBEC
325.0000 mg | DELAYED_RELEASE_TABLET | Freq: Two times a day (BID) | ORAL | Status: DC
  Administered 2015-01-01 (×2): 325 mg via ORAL
  Filled 2015-01-01 (×3): qty 1

## 2015-01-01 MED ORDER — HYDROCODONE-ACETAMINOPHEN 5-325 MG PO TABS: 1.0000 | ORAL_TABLET | Freq: Four times a day (QID) | ORAL | Status: DC | PRN

## 2015-01-01 MED ORDER — ROSUVASTATIN CALCIUM 5 MG PO TABS
5.0000 mg | ORAL_TABLET | Freq: Every day | ORAL | Status: DC
  Administered 2015-01-01: 5 mg via ORAL
  Filled 2015-01-01: qty 1

## 2015-01-01 MED ORDER — INSULIN GLARGINE 100 UNIT/ML ~~LOC~~ SOLN: 12.0000 [IU] | Freq: Every day | SUBCUTANEOUS | Status: DC

## 2015-01-01 MED ORDER — ACETAMINOPHEN 325 MG PO TABS: 650.0000 mg | ORAL_TABLET | Freq: Four times a day (QID) | ORAL | Status: DC | PRN

## 2015-01-01 MED ORDER — VITAMIN D3 25 MCG (1000 UNIT) PO TABS
2000.0000 [IU] | ORAL_TABLET | Freq: Every day | ORAL | Status: DC
  Administered 2015-01-01: 2000 [IU] via ORAL
  Filled 2015-01-01: qty 2

## 2015-01-01 MED ORDER — SERTRALINE HCL 50 MG PO TABS
150.0000 mg | ORAL_TABLET | Freq: Every day | ORAL | Status: DC
  Administered 2015-01-01: 150 mg via ORAL
  Filled 2015-01-01: qty 1

## 2015-01-01 MED ORDER — POTASSIUM CHLORIDE CRYS ER 20 MEQ PO TBCR
40.0000 meq | EXTENDED_RELEASE_TABLET | Freq: Once | ORAL | Status: AC
  Administered 2015-01-01: 40 meq via ORAL
  Filled 2015-01-01: qty 2

## 2015-01-01 MED ORDER — INSULIN ASPART 100 UNIT/ML ~~LOC~~ SOLN
3.0000 [IU] | Freq: Once | SUBCUTANEOUS | Status: AC
  Administered 2015-01-01: 3 [IU] via SUBCUTANEOUS

## 2015-01-01 MED ORDER — POLYETHYLENE GLYCOL 3350 17 G PO PACK: 17.0000 g | PACK | Freq: Every day | ORAL | Status: DC | PRN

## 2015-01-01 MED ORDER — ARIPIPRAZOLE 2 MG PO TABS
2.0000 mg | ORAL_TABLET | Freq: Every day | ORAL | Status: DC
  Administered 2015-01-01: 2 mg via ORAL
  Filled 2015-01-01 (×2): qty 1

## 2015-01-01 MED ORDER — AMLODIPINE BESYLATE 5 MG PO TABS
5.0000 mg | ORAL_TABLET | Freq: Every day | ORAL | Status: DC
  Administered 2015-01-01: 5 mg via ORAL
  Filled 2015-01-01: qty 1

## 2015-01-01 NOTE — Progress Notes (Signed)
Pt. Transported back to [facility]. Packet given to personnel. Family made aware of discharge plans. Last CBG taken, per pt. And family request. CBG: 224. Called report to receiving nurse. Vitals appear stable. Removed IV. All needs met. No further needs noted at this time.  

## 2015-01-01 NOTE — ED Notes (Signed)
Pt given apple juice for CBG 61; Dr.Gardner at bedside

## 2015-01-01 NOTE — Progress Notes (Signed)
Received notification from Deliah Goody CM for observation patients that per medical director patient is not meeting intensity level for stay. I relayed this information to Dr Janee Morn, and he stated he will evaluate the patient update me on plan of care.

## 2015-01-01 NOTE — Progress Notes (Signed)
Pt arrived to unit alert and oriented x1. Oriented to room, unit, and staff.  Bed in lowest position and call bell is within reach. Will continue to monitor. 

## 2015-01-01 NOTE — Care Management Note (Signed)
Case Management Note  Patient Details  Name: Beth Payne MRN: [id number] Date of Birth: 1940-09-07  Subjective/Objective:                 Patient from [facility]. Placed in Observation for AMS. Plan for discharge to SNF, SW following.   Action/Plan:  Discharge to SNF today per SW.   Expected Discharge Date:                  Expected Discharge Plan:  Skilled Nursing Facility  In-House Referral:  Clinical Social Work  Discharge planning Services  CM Consult  Post Acute Care Choice:    Choice offered to:     DME Arranged:    DME Agency:     HH Arranged:    HH Agency:     Status of Service:  Completed, signed off  Medicare Important Message Given:    Date Medicare IM Given:    Medicare IM give by:    Date Additional Medicare IM Given:    Additional Medicare Important Message give by:     If discussed at Long Length of Stay Meetings, dates discussed:    Additional Comments:  Lawerance Sabal, RN 01/01/2015, 2:29 PM

## 2015-01-01 NOTE — ED Notes (Signed)
CBG 61. 

## 2015-01-01 NOTE — H&P (Signed)
[facility] History and Physical  ASEEL TRUXILLO ZOX:[id number] DOB: 07-27-1940 DOA: 12/31/2014  Referring physician: EDP PCP: [name], MD   Chief Complaint: AMS   HPI: Beth Payne is a 74 y.o. female who is S/P ORIF for trimaleolar fracture of the left ankle.  She was discharged back to NH 2 days ago.  Today she developed hyperglycemia with BGL over 600 and AMS.  AMS is confusion.  Patient is oriented to self only at this time.  Review of Systems: Unable to perform due to AMS.  Past Medical History  Diagnosis Date  . Coronary artery disease   . Depression   . Retinopathy   . Anxiety   . Heart murmur   . TIA (transient ischemic attack)   . Arthritis     "fingers" (12/23/2014)  . Fibromyalgia   . Type I diabetes mellitus dx'd 1953    "I'm on a pup" (12/23/2014)  . Chronic lower back pain   . GERD (gastroesophageal reflux disease) dx'd 1995  . Osteopenia dx'd 2007   Past Surgical History  Procedure Laterality Date  . Cataract extraction w/ intraocular lens  implant, bilateral Bilateral 1998  . Tonsillectomy    . Appendectomy    . Bilateral carpal tunnel release Bilateral 2003-2004  . Back surgery    . Anterior cervical decomp/discectomy fusion  05/2003    "C5-6"  . Abdominal hysterectomy  1972  . Dilation and curettage of uterus    . Cesarean section    . Eye surgery Bilateral since 1977    "numerous slaser surgeries for retinopathy"  . Trigger finger release Bilateral 2003  . Cervical disc surgery  2004    "collapsed C-5"  . Fracture surgery    . Patella fracture surgery Left 04/2007  . Vertebroplasty  01/2008    "compressed lumbar fracture"  . Laparoscopic lysis of adhesions  1975-1987 X 5  . Exploratory laparotomy  8119-1478 X 1    "adhesions"  . Oophorectomy Bilateral 1980's  . Vitrectomy Right 08/1997  . Bunionectomy with hammertoe reconstruction Right 02/1999  . Coronary angioplasty with stent placement  07/1999    "?1"  . Lumbar  microdiscectomy  03/2005  . Orif ankle fracture Left 12/25/2014    Procedure: OPEN REDUCTION INTERNAL FIXATION (ORIF) LEFT TRIMALLEOLAR ANKLE FRACTURE;  Surgeon: Tarry Kos, MD;  Location: [facility] OR;  Service: Orthopedics;  Laterality: Left;   Social History:  reports that she has been smoking Cigarettes.  She has a 27 pack-year smoking history. She has never used smokeless tobacco. She reports that she does not drink alcohol or use illicit drugs.  Allergies  Allergen Reactions  . Diclofenac Sodium Swelling and Other (See Comments)    Blurred vision  . Doxycycline Calcium Swelling and Other (See Comments)    Blurred vision  . Codeine Nausea And Vomiting and Other (See Comments)    Dizziness   . Zocor [Simvastatin] Other (See Comments)    Dizziness, weakness     Family History  Problem Relation Age of Onset  . Liver disease Sister   . Colon cancer Brother      Prior to Admission medications   Medication Sig Start Date End Date Taking? Authorizing Provider  acetaminophen (TYLENOL) 325 MG tablet Take 2 tablets (650 mg total) by mouth every 6 (six) hours as needed for mild pain (or Fever >/= 101). 12/27/14   Christiane Ha, MD  ARIPiprazole (ABILIFY) 2 MG tablet Take 2 mg by mouth at bedtime.  Historical Provider, MD  aspirin 325 MG EC tablet Take 1 tablet (325 mg total) by mouth 2 (two) times daily. For 6 weeks, then daily 12/27/14   Christiane Ha, MD  cholecalciferol (VITAMIN D) 1000 UNITS tablet Take 2,000 Units by mouth daily.    Historical Provider, MD  docusate sodium (COLACE) 100 MG capsule Take 100 mg by mouth 2 (two) times daily.    Historical Provider, MD  HYDROcodone-acetaminophen (NORCO) 5-325 MG per tablet Take 1-2 tablets by mouth every 6 (six) hours as needed. 12/25/14   Tarry Kos, MD  insulin aspart (NOVOLOG) 100 UNIT/ML injection Inject 0-6 Units into the skin 3 (three) times daily with meals. 121-150=0 units 151-200=1 unit 201-250=2 units  251-300=3  units 301-350=4 units 351-400=6 units >400 give 6 units and call MD 12/15/14   Starleen Arms, MD  insulin glargine (LANTUS) 100 UNIT/ML injection Inject 0.08 mLs (8 Units total) into the skin daily. 12/27/14   Christiane Ha, MD  Multiple Vitamin (MULITIVITAMIN WITH MINERALS) TABS Take 1 tablet by mouth daily.    Historical Provider, MD  omeprazole (PRILOSEC) 20 MG capsule Take 40 mg by mouth daily.    Historical Provider, MD  polyethylene glycol (MIRALAX / GLYCOLAX) packet Take 17 g by mouth daily as needed for mild constipation. 12/27/14   Christiane Ha, MD  rosuvastatin (CRESTOR) 5 MG tablet Take 5 mg by mouth daily.    Historical Provider, MD  sertraline (ZOLOFT) 100 MG tablet Take 150 mg by mouth daily.    Historical Provider, MD   Physical Exam: Filed Vitals:   12/31/14 2310  BP: 123/45  Pulse: 91  Temp:   Resp: 14    BP 123/45 mmHg  Pulse 91  Temp(Src) 97.5 F (36.4 C) (Oral)  Resp 14  SpO2 100%  General Appearance:    Alert, oriented to self only, no distress, appears stated age  Head:    Normocephalic, atraumatic  Eyes:    PERRL, EOMI, sclera non-icteric        Nose:   Nares without drainage or epistaxis. Mucosa, turbinates normal  Throat:   Moist mucous membranes. Oropharynx without erythema or exudate.  Neck:   Supple. No carotid bruits.  No thyromegaly.  No lymphadenopathy.   Back:     No CVA tenderness, no spinal tenderness  Lungs:     Clear to auscultation bilaterally, without wheezes, rhonchi or rales  Chest wall:    No tenderness to palpitation  Heart:    Regular rate and rhythm without murmurs, gallops, rubs  Abdomen:     Soft, non-tender, nondistended, normal bowel sounds, no organomegaly  Genitalia:    deferred  Rectal:    deferred  Extremities:   LLE under surgical splint, splint is C/D/I  Pulses:   2+ and symmetric all extremities  Skin:   Skin color, texture, turgor normal, no rashes or lesions  Lymph nodes:   Cervical, supraclavicular, and  axillary nodes normal  Neurologic:   CNII-XII intact. Normal strength, sensation and reflexes      throughout    Labs on Admission:  Basic Metabolic Panel:  Recent Labs Lab 12/26/14 0340 12/31/14 2052  NA 137 137  K 3.2* 3.2*  CL 104 101  CO2 21* 22  GLUCOSE 240* 200*  BUN <5* 24*  CREATININE 0.73 1.11*  CALCIUM 8.9 9.9   Liver Function Tests:  Recent Labs Lab 12/31/14 2052  AST 29  ALT 15  ALKPHOS 70  BILITOT 0.4  PROT 5.9*  ALBUMIN 3.1*   No results for input(s): LIPASE, AMYLASE in the last 168 hours.  Recent Labs Lab 12/31/14 2056  AMMONIA 17   CBC:  Recent Labs Lab 12/26/14 0340 12/31/14 2052  WBC 7.9 7.7  HGB 9.0* 8.6*  HCT 27.5* 25.5*  MCV 83.8 81.2  PLT 295 447*   Cardiac Enzymes: No results for input(s): CKTOTAL, CKMB, CKMBINDEX, TROPONINI in the last 168 hours.  BNP (last 3 results) No results for input(s): PROBNP in the last 8760 hours. CBG:  Recent Labs Lab 12/26/14 1724 12/26/14 2212 12/27/14 0810 12/27/14 1223 01/01/15 0012  GLUCAP 183* 168* 211* 255* 61*    Radiological Exams on Admission: Ct Head Wo Contrast  12/31/2014   CLINICAL DATA:  Altered mental status.  EXAM: CT HEAD WITHOUT CONTRAST  TECHNIQUE: Contiguous axial images were obtained from the base of the skull through the vertex without intravenous contrast.  COMPARISON:  06/27/2009  FINDINGS: There is no intracranial hemorrhage, mass or evidence of acute infarction. There is moderate generalized atrophy. There is moderate periventricular hypodensity consistent with chronic small vessel ischemic disease. The atrophy and white matter hypodensities are worsened from 06/27/2009. No focal brain abnormalities are evident. No bony abnormality is evident. The visible paranasal sinuses are clear.  IMPRESSION: Moderate generalized atrophy and chronic small vessel ischemic disease. No acute findings.   Electronically Signed   By: Ellery Plunk M.D.   On: 12/31/2014 23:54   Dg  Chest Portable 1 View  12/31/2014   CLINICAL DATA:  Altered mental status.  EXAM: PORTABLE CHEST - 1 VIEW  COMPARISON:  12/12/2014  FINDINGS: Patient's chin obscures the right greater than left lung apex. Lung volumes are low. There is unchanged cardiomegaly and vascular congestion. Bibasilar opacities are again seen, improved on the right and mildly worsened on the left. No large pleural effusion or pneumothorax.  IMPRESSION: 1. Hypoventilatory chest with unchanged cardiomegaly and vascular congestion. 2. Worsening left basilar aeration, may reflect atelectasis, aspiration, or pneumonia. Mildly improved right basilar aeration. Shifting pattern favors atelectasis.   Electronically Signed   By: Rubye Oaks M.D.   On: 12/31/2014 21:12    EKG: Independently reviewed.  Assessment/Plan Principal Problem:   Delirium Active Problems:   DM (diabetes mellitus), type 1 with complications   Fracture, ankle   Atelectasis of left lung   1. Delirium - unclear cause 1. Monitor patient overnight 2. Atelectasis of left lung - treat with Incentive spirometry. 3. DM1 - 1. Continue home dose of lantus 12 units daily 2. SSI AC/HS sensitive scale 3. Q2H BGLs ordered for tonight and today due to labile BGLs since arrival to ED. 4. Slight elevation in creatinine - from 0.8 to 1.2, suspect this is due to them starting lisinopril on the patient at [facility] (done on 7/25 according to NH notes), will hold off on continuing lisinopril here.   Code Status: DNR - has yellow DNR form with her NH paperwork Family Communication: Friend at bedside, patient has no family in the area apparently Disposition Plan: Admit to obs   Time spent: 70 min  Krishawna Stiefel M. [facility] Pager [phone]  If 7AM-7PM, please contact the day team taking care of the patient [email or web address] Password [facility] 01/01/2015, 12:44 AM

## 2015-01-01 NOTE — Progress Notes (Signed)
Received report from [place], RN in ED for transfer of pt to [phone].

## 2015-01-01 NOTE — Clinical Social Work Note (Signed)
Clinical Social Work Assessment  Patient Details  Name: Beth Payne MRN: [id number] Date of Birth: 24-Mar-1941  Date of referral:  01/01/15               Reason for consult:  Facility Placement                Permission sought to share information with:  Oceanographer granted to share information::  Yes, Verbal Permission Granted  Name::        Agency::  [facility]  Relationship::     Contact Information:     Housing/Transportation Living arrangements for the past 2 months:  Skilled Building surveyor of Information:  Patient Patient Interpreter Needed:  None Criminal Activity/Legal Involvement Pertinent to Current Situation/Hospitalization:  No - Comment as needed Significant Relationships:  Friend Lives with:  Self, Facility Resident (Living alone prior to previous hospitalzation when she went to SNF) Do you feel safe going back to the place where you live?  Yes Need for family participation in patient care:  Yes (Comment) (Pt currently disoriented but able to be redirected)  Care giving concerns:  Pt admitted from facility   Social Worker assessment / plan:  CSW visited pt room to discuss dc planning. Pt able to appropriately answer most questions but did confuse where she was during the assessment. CSW familiar with pt from previous admission and pt also remembered CSW. Pt informed CSW she was not happy with first facility but is happy at [facility]. She confirmed if she needs continued rehab she will go back to [facility]. Facility also confirmed they can accept pt back at dc. CSW unable to reach pt emergency contact, but will continue to try if pt remains confused.  Employment status:    Insurance information:  Medicare PT Recommendations:  Not assessed at this time Information / Referral to community resources:     Patient/Family's Response to care:  Pt agreeable to returning to SNF  Patient/Family's Understanding of and Emotional  Response to Diagnosis, Current Treatment, and Prognosis:  Pt somewhat disoriented at this time so unable to assess understanding of condition. Pt with appropriate emotional response.  Emotional Assessment Appearance:  Appears stated age, Well-Groomed Attitude/Demeanor/Rapport:  Other (Cooperative) Affect (typically observed):  Calm Orientation:  Oriented to Place, Oriented to Self Alcohol / Substance use:  Not Applicable Psych involvement (Current and /or in the community):  No (Comment)  Discharge Needs  Concerns to be addressed:  Discharge Planning Concerns Readmission within the last 30 days:  Yes Current discharge risk:  Dependent with Mobility Barriers to Discharge:  Continued Medical Work up   [employer] [phone]

## 2015-01-01 NOTE — Discharge Summary (Signed)
Physician Discharge Summary  Beth Payne WUJ:[id number] DOB: 1941/02/20 DOA: 12/31/2014  PCP: [name], MD  Admit date: 12/31/2014 Discharge date: 01/01/2015  Time spent: 65 minutes  Recommendations for Outpatient Follow-up:  1. Follow-up with M.D. at skilled nursing facility. Patient will need CBGs checked before meals and at bedtime. Patient blood sugars will need to be followed in Lantus dose adjusted per PCP. Patient's blood pressure also need to be reassessed as her lisinopril was discontinued and patient started on Norvasc. Patient will need a basic metabolic profile done in 1 week to follow-up on electrolytes and renal function. Patient also need a CBC done to follow-up on H&H. 2. Patient is to follow-up with Dr. Roda Shutters of orthopedics as previously scheduled.  Discharge Diagnoses:  Principal Problem:   Acute encephalopathy Active Problems:   Normocytic anemia   DM (diabetes mellitus), type 1 with complications   Fracture, ankle   Atelectasis of left lung   Discharge Condition: Stable and improved  Diet recommendation: Carb Modified  Filed Weights   01/01/15 0228  Weight: 52.4 kg (115 lb 8.3 oz)    History of present illness:  Per Dr Laneta Simmers is a 74 y.o. female who is S/P ORIF for trimaleolar fracture of the left ankle. She was discharged back to NH 2 days prior to admission. On the day of admission, she developed hyperglycemia with BGL over 600 and AMS. AMS is confusion. Patient was oriented to self only at this time.  Hospital Course:  #1 acute encephalopathy Unknown etiology. May be narcotic induced as patient was discharged to nursing facility on Vicodin versus secondary to hyperglycemia. Infectious workup was negative. Patient is currently afebrile. Patient is alert oriented 3. Patient following commands. Will discontinue Vicodin and place patient on Tylenol No. 3 as needed for her pain at the facility. Patient be discharged in stable and  improved condition.  #2 diabetes type 1 with hyperglycemia Patient was noted to have CBGs greater than 600 on presentation to the ED. Patient was admitted placed on telemetry and a home dose of Lantus increased from 8 units daily to 12 units daily. Patient was placed on a sensitive sliding scale. Patient's blood sugars improved and his CBGs were ranging from 78-223 on day of discharge. Patient be discharged on increased dose of Lantus as well as sliding scale insulin. Outpatient follow-up.  #3 elevated creatinine Patient was noted to have elevated creatinine which was felt to be secondary to recently started lisinopril. Lisinopril was discontinued. Patient was hydrated with IV fluids. Patient's renal function improved such that by day of discharge patient's creatinine was down to 0.83. Outpatient follow-up.  #4 hypertension Patient's lisinopril was discontinued on admission. Patient be discharged on Norvasc 5 mg daily. This may be titrated per M.D. at the facility.  #5 left ankle fracture status post ORIF 7/20 per Dr.Xu Remained stable. Patient is to be nonweightbearing as previous recommendations from prior discharge. Patient on aspirin per orthopedics for DVT prophylaxis. Patient will follow-up with orthopedics as previously scheduled.  #6 anemia Patient's hemoglobin remained stable. Outpatient follow-up.  Procedures:  CT Head 12/31/14  CXR 12/31/14  Consultations:  None  Discharge Exam: Filed Vitals:   01/01/15 1509  BP: 143/67  Pulse: 82  Temp: 98.2 F (36.8 C)  Resp: 17    General: NAD Cardiovascular: RRR Respiratory: CTAB  Discharge Instructions   Discharge Instructions    Diet Carb Modified    Complete by:  As directed  Discharge instructions    Complete by:  As directed   Follow up MD at SNF          Current Discharge Medication List    START taking these medications   Details  amLODipine (NORVASC) 5 MG tablet Take 1 tablet (5 mg total) by mouth  daily. Qty: 30 tablet, Refills: 0      CONTINUE these medications which have CHANGED   Details  insulin glargine (LANTUS) 100 UNIT/ML injection Inject 0.12 mLs (12 Units total) into the skin daily. Qty: 10 mL, Refills: 0      CONTINUE these medications which have NOT CHANGED   Details  acetaminophen (TYLENOL) 325 MG tablet Take 2 tablets (650 mg total) by mouth every 6 (six) hours as needed for mild pain (or Fever >/= 101).    polyethylene glycol (MIRALAX / GLYCOLAX) packet Take 17 g by mouth daily as needed for mild constipation. Qty: 14 each, Refills: 0    ARIPiprazole (ABILIFY) 2 MG tablet Take 2 mg by mouth at bedtime.     aspirin 325 MG EC tablet Take 1 tablet (325 mg total) by mouth 2 (two) times daily. For 6 weeks, then daily    cholecalciferol (VITAMIN D) 1000 UNITS tablet Take 2,000 Units by mouth daily.    docusate sodium (COLACE) 100 MG capsule Take 100 mg by mouth 2 (two) times daily.    insulin aspart (NOVOLOG) 100 UNIT/ML injection Inject 0-6 Units into the skin 3 (three) times daily with meals. 121-150=0 units 151-200=1 unit 201-250=2 units  251-300=3 units 301-350=4 units 351-400=6 units >400 give 6 units and call MD Qty: 10 mL, Refills: 11    Multiple Vitamin (MULITIVITAMIN WITH MINERALS) TABS Take 1 tablet by mouth daily.    omeprazole (PRILOSEC) 20 MG capsule Take 40 mg by mouth daily.    rosuvastatin (CRESTOR) 5 MG tablet Take 5 mg by mouth daily.    sertraline (ZOLOFT) 100 MG tablet Take 150 mg by mouth daily.      STOP taking these medications     HYDROcodone-acetaminophen (NORCO) 5-325 MG per tablet        Allergies  Allergen Reactions  . Diclofenac Sodium Swelling and Other (See Comments)    Blurred vision  . Doxycycline Calcium Swelling and Other (See Comments)    Blurred vision  . Codeine Nausea And Vomiting and Other (See Comments)    Dizziness   . Zocor [Simvastatin] Other (See Comments)    Dizziness, weakness    Follow-up  Information    Please follow up.   Why:  f/u with MD at SNF      Follow up with Cheral Almas, MD.   Specialty:  Orthopedic Surgery   Why:  Follow-up as previously scheduled   Contact information:   [address] [place] [address] [phone]        The results of significant diagnostics from this hospitalization (including imaging, microbiology, ancillary and laboratory) are listed below for reference.    Significant Diagnostic Studies: Dg Ankle 2 Views Left  12/23/2014   CLINICAL DATA:  Post reduction LEFT ankle  EXAM: LEFT ANKLE - 2 VIEW  COMPARISON:  12/23/2014  FINDINGS: Fiberglass splint material obscures bone detail.  Marked osseous demineralization.  Persistent significantly displaced and angulated bimalleolar fractures.  Persistent lateral tibiotalar dislocation, question impacted upon lateral margin of distal tibia.  No new bony abnormalities identified.  Scattered atherosclerotic calcification.  IMPRESSION: Displaced and angulated bimalleolar fractures little changed.  Persistent  lateral tibiotalar dislocation as above.   Electronically Signed   By: Ulyses Southward M.D.   On: 12/23/2014 13:51   Dg Ankle Complete Left  12/25/2014   CLINICAL DATA:  ORIF LEFT ankle  EXAM: DG C-ARM 61-120 MIN; LEFT ANKLE COMPLETE - 3+ VIEW  COMPARISON:  Three digital C-arm fluoroscopic images obtained intraoperatively are compared to prior study of 12/23/2014 and CT LEFT ankle of 12/24/2014  FINDINGS: Diffuse osseous demineralization.  Lateral plate and multiple screws with placed across a reduced fracture of the distal LEFT fibula.  Two cannulated screws placed across to reduced fracture of the medial malleolus.  Ankle mortise intact.  No additional fracture or dislocation identified.  IMPRESSION: Post bimalleolar ORIF LEFT ankle.   Electronically Signed   By: Ulyses Southward M.D.   On: 12/25/2014 17:52   Dg Ankle Complete Left  12/23/2014   CLINICAL DATA:  Larey Seat last night walking to the  restroom. Pain and deformity.  EXAM: LEFT ANKLE COMPLETE - 3+ VIEW  COMPARISON:  None.  FINDINGS: Fracture dislocation at the ankle joint with an oblique fracture of the distal fibula and transverse fracture of the medial malleolus. Posterior lip of the tibia appears intact. Dislocation at the talar dome relative to the distal tibial articular surface with talar dome being laterally position and possibly impacted upon the lateral margin of the tibia.  IMPRESSION: Fracture dislocation of the ankle joint as described above.   Electronically Signed   By: Paulina Fusi M.D.   On: 12/23/2014 11:05   Ct Head Wo Contrast  12/31/2014   CLINICAL DATA:  Altered mental status.  EXAM: CT HEAD WITHOUT CONTRAST  TECHNIQUE: Contiguous axial images were obtained from the base of the skull through the vertex without intravenous contrast.  COMPARISON:  06/27/2009  FINDINGS: There is no intracranial hemorrhage, mass or evidence of acute infarction. There is moderate generalized atrophy. There is moderate periventricular hypodensity consistent with chronic small vessel ischemic disease. The atrophy and white matter hypodensities are worsened from 06/27/2009. No focal brain abnormalities are evident. No bony abnormality is evident. The visible paranasal sinuses are clear.  IMPRESSION: Moderate generalized atrophy and chronic small vessel ischemic disease. No acute findings.   Electronically Signed   By: Ellery Plunk M.D.   On: 12/31/2014 23:54   Ct Ankle Left Wo Contrast  12/24/2014   CLINICAL DATA:  The patient caught her left foot in a wheelchair 12/21/2014 resulting and sustained a left ankle fracture. Pain. Initial encounter.  EXAM: CT OF THE LEFT ANKLE WITHOUT CONTRAST  TECHNIQUE: Multidetector CT imaging of the left ankle was performed according to the standard protocol. Multiplanar CT image reconstructions were also generated.  COMPARISON:  Plain films 12/23/2014.  FINDINGS: The patient has a fracture of the distal  fibula originating in the lateral cortex 4.3 cm above the tip of the lateral malleolus. The fracture extends in an oblique and medial orientation through the distal diaphysis. There is approximately 1/2 shaft with lateral displacement.  A medial malleolar fracture is identified with approximately 0.5 cm lateral displacement. The patient also has a fracture of the posterior malleolus which demonstrates cephalad displacement of approximately 0.3 cm. This fracture fragment measures approximately 1.3 cm transverse by 0.6 cm AP at the plafond by 1 cm craniocaudal and is eccentric laterally. No other fracture is identified. Bones are osteopenic. 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  Soft tissue swelling and hematoma are seen about the ankle. No tendon entrapment is identified. All visualized  tendons appear intact. No ligament tear is visualized by CT scan.  IMPRESSION: Trimalleolar fractures as described above.  Osteopenia.  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   Electronically Signed   By: Drusilla Kanner M.D.   On: 12/24/2014 08:22   Nm Myocar Multi W/spect W/wall Motion / Ef  12/15/2014   CLINICAL DATA:  Chest pain.  Coronary artery disease.  Diabetes.  EXAM: MYOCARDIAL IMAGING WITH SPECT (REST AND PHARMACOLOGIC-STRESS)  GATED LEFT VENTRICULAR WALL MOTION STUDY  LEFT VENTRICULAR EJECTION FRACTION  TECHNIQUE: Standard myocardial SPECT imaging was performed after resting intravenous injection of 10 mCi Tc-77m sestamibi. Subsequently, intravenous infusion of Lexiscan was performed under the supervision of the Cardiology staff. At peak effect of the drug, 30 mCi Tc-7m sestamibi was injected intravenously and standard myocardial SPECT imaging was performed. Quantitative gated imaging was also performed to evaluate left ventricular wall motion, and estimate left ventricular ejection fraction.  COMPARISON:  None.  FINDINGS: Perfusion: No reversible left ventricular myocardial perfusion defects seen. A  moderate fixed defect is seen involving the apical wall on both the stress and resting images. Given the absence of corresponding motion abnormality, this favors apical thinning over apical wall infarction.  Wall Motion: Normal left ventricular wall motion. No left ventricular dilation.  Left Ventricular Ejection Fraction: 74 %  End diastolic volume 61 ml  End systolic volume 16 ml  IMPRESSION: 1. No reversible ischemia. Probable apical wall thinning, with apical wall infarction considered less likely.  2. Normal left ventricular wall motion.  3. Left ventricular ejection fraction 74%  4. Low-risk stress test findings*.  *2012 Appropriate Use Criteria for Coronary Revascularization Focused Update: J Am Coll Cardiol. 2012;59(9):857-881. http://content.dementiazones.com.aspx?articleid=1201161   Electronically Signed   By: Myles Rosenthal M.D.   On: 12/15/2014 13:20   Dg Chest Portable 1 View  12/31/2014   CLINICAL DATA:  Altered mental status.  EXAM: PORTABLE CHEST - 1 VIEW  COMPARISON:  12/12/2014  FINDINGS: Patient's chin obscures the right greater than left lung apex. Lung volumes are low. There is unchanged cardiomegaly and vascular congestion. Bibasilar opacities are again seen, improved on the right and mildly worsened on the left. No large pleural effusion or pneumothorax.  IMPRESSION: 1. Hypoventilatory chest with unchanged cardiomegaly and vascular congestion. 2. Worsening left basilar aeration, may reflect atelectasis, aspiration, or pneumonia. Mildly improved right basilar aeration. Shifting pattern favors atelectasis.   Electronically Signed   By: Rubye Oaks M.D.   On: 12/31/2014 21:12   Dg Chest Port 1 View  12/12/2014   CLINICAL DATA:  DKA  EXAM: PORTABLE CHEST - 1 VIEW  COMPARISON:  09/08/2011  FINDINGS: Mild cardiac enlargement. Right Coronary artery stent. Mild vascular congestion without edema.  Mild bibasilar atelectasis/ infiltrate.  No effusion.  IMPRESSION: Mild vascular congestion  without edema  Mild bibasilar atelectasis/infiltrate.   Electronically Signed   By: Marlan Palau M.D.   On: 12/12/2014 13:30   Dg Ankle Left Port  12/23/2014   CLINICAL DATA:  Post reduction  EXAM: PORTABLE LEFT ANKLE - 2 VIEW  COMPARISON:  Portable exam 1613 hours compared to earlier studies of 12/23/2014  FINDINGS: Osseous demineralization.  Previously identified lateral talar subluxation appear significantly improved.  Mildly displaced lateral malleolar fracture little changed.  Question mild medial displacement of media malleolar fragment versus artifact from superimposed plaster splint.  IMPRESSION: Improved tibiotalar alignment post reduction.  Mildly displaced bimalleolar fractures as above.   Electronically Signed   By: Ulyses Southward M.D.   On:  12/23/2014 16:23   Dg Ankle Left Port  12/23/2014   CLINICAL DATA:  Postreduction radiographs are in close reduction of left ankle fractures.  EXAM: PORTABLE LEFT ANKLE - 2 VIEW  COMPARISON:  12/23/2014 at 1327 hours and 1059 hours  FINDINGS: Improved fracture reduction since the earlier postreduction study.  The talus is now all partly reduced. It remains subluxed laterally in relation to the distal tibial articular surface, by approximately 7 mm. Fractures of the medial malleolus and distal fibula are displaced laterally by a similar degree.  IMPRESSION: Improved reduction of the bimalleolar ankle fracture. The displaced talus has been reduced, but remains subluxed laterally in relation to the distal tibia by 7 mm.   Electronically Signed   By: Amie Portland M.D.   On: 12/23/2014 15:00   Dg C-arm 1-60 Min  12/25/2014   CLINICAL DATA:  ORIF LEFT ankle  EXAM: DG C-ARM 61-120 MIN; LEFT ANKLE COMPLETE - 3+ VIEW  COMPARISON:  Three digital C-arm fluoroscopic images obtained intraoperatively are compared to prior study of 12/23/2014 and CT LEFT ankle of 12/24/2014  FINDINGS: Diffuse osseous demineralization.  Lateral plate and multiple screws with placed across a  reduced fracture of the distal LEFT fibula.  Two cannulated screws placed across to reduced fracture of the medial malleolus.  Ankle mortise intact.  No additional fracture or dislocation identified.  IMPRESSION: Post bimalleolar ORIF LEFT ankle.   Electronically Signed   By: Ulyses Southward M.D.   On: 12/25/2014 17:52    Microbiology: Recent Results (from the past 240 hour(s))  Urine culture     Status: None   Collection Time: 12/23/14 10:33 AM  Result Value Ref Range Status   Specimen Description URINE, CLEAN CATCH  Final   Special Requests Normal  Final   Culture   Final    MULTIPLE SPECIES PRESENT, SUGGEST RECOLLECTION IF CLINICALLY INDICATED   Report Status 12/24/2014 FINAL  Final  Urine culture     Status: None (Preliminary result)   Collection Time: 12/31/14 10:20 PM  Result Value Ref Range Status   Specimen Description URINE, CLEAN CATCH  Final   Special Requests NONE  Final   Culture NO GROWTH < 24 HOURS  Final   Report Status PENDING  Incomplete     Labs: Basic Metabolic Panel:  Recent Labs Lab 12/26/14 0340 12/31/14 2052 01/01/15 0535 01/01/15 0911  NA 137 137 138  --   K 3.2* 3.2* 3.4*  --   CL 104 101 106  --   CO2 21* 22 22  --   GLUCOSE 240* 200* 146*  --   BUN <5* 24* 21*  --   CREATININE 0.73 1.11* 0.83  --   CALCIUM 8.9 9.9 9.3  --   MG  --   --   --  1.7   Liver Function Tests:  Recent Labs Lab 12/31/14 2052  AST 29  ALT 15  ALKPHOS 70  BILITOT 0.4  PROT 5.9*  ALBUMIN 3.1*   No results for input(s): LIPASE, AMYLASE in the last 168 hours.  Recent Labs Lab 12/31/14 2056  AMMONIA 17   CBC:  Recent Labs Lab 12/26/14 0340 12/31/14 2052 01/01/15 0535  WBC 7.9 7.7 11.2*  HGB 9.0* 8.6* 9.0*  HCT 27.5* 25.5* 25.8*  MCV 83.8 81.2 80.6  PLT 295 447* 439*   Cardiac Enzymes: No results for input(s): CKTOTAL, CKMB, CKMBINDEX, TROPONINI in the last 168 hours. BNP: BNP (last 3 results) No results for input(s): BNP  in the last 8760  hours.  ProBNP (last 3 results) No results for input(s): PROBNP in the last 8760 hours.  CBG:  Recent Labs Lab 01/01/15 0255 01/01/15 0501 01/01/15 0658 01/01/15 0731 01/01/15 1215  GLUCAP 78 152* 140* 133* 223*       Signed:  Gwendolyn Mclees MD [facility] 01/01/2015, 3:14 PM

## 2015-01-01 NOTE — Progress Notes (Signed)
CSW (Clinical Child psychotherapist) prepared pt dc packet and placed with shadow chart. CSW arranged non-emergent ambulance transport for 4:30pm per nursing request. Pt, pt family, pt nurse, and facility informed. Pt and pt friend/family at bedside did express concerns about discharge. CSW provided support but family still unhappy. Nurse present for conversation and aware. Pt caregiver did speak with MD. Pt has no further hospital social work needs. CSW signing off.  Taleya Whitcher, LCSWA [phone]

## 2015-01-01 NOTE — Progress Notes (Signed)
Attempted report. RN will call back. 

## 2015-01-01 NOTE — Evaluation (Signed)
Physical Therapy Evaluation Patient Details Name: Beth Payne MRN: [id number] DOB: [birth year] Today's Date: 01/01/2015   History of Present Illness  Admitted with L ankle fracture and subsequent ORIF after falling at SNF when ambulating in her room.  She had been there recovering from recent hospitalization for DKA and NSTEMI. PMH: IDDM, GERD, CD, OA, depression  Clinical Impression  Patient demonstrates deficits in functional mobiolity as indicated below. Will need continued skilled PT to address deficits and maximize function. Will see as indicated and progress as tolerated.    Follow Up Recommendations SNF    Equipment Recommendations  None recommended by PT    Recommendations for Other Services       Precautions / Restrictions Precautions Precautions: Fall Restrictions Weight Bearing Restrictions: Yes LLE Weight Bearing: Non weight bearing      Mobility  Bed Mobility               General bed mobility comments: received in chair on bed pain, incontinent  Transfers Overall transfer level: Needs assistance Equipment used: 2 person hand held assist Transfers: Sit to/from Stand;Stand Pivot Transfers Sit to Stand: Max assist;+2 physical assistance Stand pivot transfers: Max assist;+2 physical assistance       General transfer comment: assist for body support and initiation/completion of transfer. Verbal cues for R foot placement for transfer  Ambulation/Gait             General Gait Details: unable to perform at this time  Stairs            Wheelchair Mobility    Modified Rankin (Stroke Patients Only)       Balance Overall balance assessment: History of Falls   Sitting balance-Leahy Scale: Fair       Standing balance-Leahy Scale: Zero                               Pertinent Vitals/Pain Pain Assessment: Faces Faces Pain Scale: Hurts even more Pain Location: left ankle Pain Descriptors / Indicators:  Grimacing;Guarding Pain Intervention(s): Limited activity within patient's tolerance;Monitored during session;Repositioned    Home Living Family/patient expects to be discharged to:: Skilled nursing facility Living Arrangements: Alone Available Help at Discharge: Skilled Nursing Facility Type of Home: House Home Access: Stairs to enter Entrance Stairs-Rails: Left;Right;Can reach both Secretary/administrator of Steps: 6 Home Layout: One level Home Equipment: Shower seat;Bedside commode;Walker - 2 wheels      Prior Function                 Hand Dominance        Extremity/Trunk Assessment               Lower Extremity Assessment: Generalized weakness   LLE Deficits / Details: L lower leg cast  Cervical / Trunk Assessment: Kyphotic  Communication      Cognition Arousal/Alertness: Awake/alert Behavior During Therapy: WFL for tasks assessed/performed;Anxious Overall Cognitive Status: Impaired/Different from baseline Area of Impairment: Orientation;Following commands;Safety/judgement;Awareness;Problem solving Orientation Level: Disoriented to;Time   Memory: Decreased short-term memory   Safety/Judgement: Decreased awareness of safety;Decreased awareness of deficits          General Comments General comments (skin integrity, edema, etc.): patient incontinent of urine, assisted with transfers, hygiene and pericare. Patient with difficulty remaining NWBing on LLE. Patient required assist for funtctional tasks at this time.     Exercises        Assessment/Plan    PT Assessment  Patient needs continued PT services  PT Diagnosis Difficulty walking;Generalized weakness;Acute pain   PT Problem List Decreased activity tolerance;Decreased balance;Decreased mobility;Decreased strength;Decreased knowledge of use of DME;Decreased knowledge of precautions  PT Treatment Interventions Gait training;Functional mobility training;Therapeutic activities;Therapeutic  exercise;DME instruction;Balance training   PT Goals (Current goals can be found in the Care Plan section) Acute Rehab PT Goals Patient Stated Goal: Pt agreeable to go to SNF at time of dc PT Goal Formulation: With patient Time For Goal Achievement: 01/02/15 Potential to Achieve Goals: Good    Frequency Min 3X/week   Barriers to discharge Decreased caregiver support      Co-evaluation PT/OT/SLP Co-Evaluation/Treatment: Yes Reason for Co-Treatment: Complexity of the patient's impairments (multi-system involvement);Necessary to address cognition/behavior during functional activity;For patient/therapist safety PT goals addressed during session: Mobility/safety with mobility         End of Session Equipment Utilized During Treatment: Gait belt Activity Tolerance: Patient limited by pain Patient left: in chair;with call bell/phone within reach;with chair alarm set;with family/visitor present Nurse Communication: Mobility status;Patient requests pain meds         Time: 1334-1400 PT Time Calculation (min) (ACUTE ONLY): 26 min   Charges:   PT Evaluation $Initial PT Evaluation Tier I: 1 Procedure     PT G Codes:   PT G-Codes **NOT FOR INPATIENT CLASS** Functional Assessment Tool Used: clinical judgement Functional Limitation: Mobility: Walking and moving around Mobility: Walking and Moving Around Current Status ([id number]): At least 60 percent but less than 80 percent impaired, limited or restricted Mobility: Walking and Moving Around Goal Status [phone]): At least 20 percent but less than 40 percent impaired, limited or restricted    Fabio Asa 01/01/2015, 2:09 PM Charlotte Crumb, PT DPT  [phone]

## 2015-01-02 LAB — URINE CULTURE

## 2015-01-09 DIAGNOSIS — S82852D Displaced trimalleolar fracture of left lower leg, subsequent encounter for closed fracture with routine healing: Secondary | ICD-10-CM | POA: Diagnosis not present

## 2015-01-28 DIAGNOSIS — S82852D Displaced trimalleolar fracture of left lower leg, subsequent encounter for closed fracture with routine healing: Secondary | ICD-10-CM | POA: Diagnosis not present

## 2015-02-11 DIAGNOSIS — E10319 Type 1 diabetes mellitus with unspecified diabetic retinopathy without macular edema: Secondary | ICD-10-CM | POA: Diagnosis not present

## 2015-02-11 DIAGNOSIS — I2510 Atherosclerotic heart disease of native coronary artery without angina pectoris: Secondary | ICD-10-CM | POA: Diagnosis not present

## 2015-02-11 DIAGNOSIS — Z681 Body mass index (BMI) 19 or less, adult: Secondary | ICD-10-CM | POA: Diagnosis not present

## 2015-02-20 ENCOUNTER — Encounter: Payer: Self-pay | Admitting: Adult Health

## 2015-02-20 ENCOUNTER — Non-Acute Institutional Stay (SKILLED_NURSING_FACILITY): Payer: Medicare Other | Admitting: Adult Health

## 2015-02-20 DIAGNOSIS — D62 Acute posthemorrhagic anemia: Secondary | ICD-10-CM

## 2015-02-20 DIAGNOSIS — E108 Type 1 diabetes mellitus with unspecified complications: Secondary | ICD-10-CM

## 2015-02-20 DIAGNOSIS — F322 Major depressive disorder, single episode, severe without psychotic features: Secondary | ICD-10-CM

## 2015-02-20 DIAGNOSIS — I10 Essential (primary) hypertension: Secondary | ICD-10-CM

## 2015-02-20 DIAGNOSIS — K5900 Constipation, unspecified: Secondary | ICD-10-CM | POA: Diagnosis not present

## 2015-02-20 DIAGNOSIS — E785 Hyperlipidemia, unspecified: Secondary | ICD-10-CM

## 2015-02-20 DIAGNOSIS — S82892A Other fracture of left lower leg, initial encounter for closed fracture: Secondary | ICD-10-CM

## 2015-02-20 DIAGNOSIS — F329 Major depressive disorder, single episode, unspecified: Secondary | ICD-10-CM

## 2015-02-20 DIAGNOSIS — K219 Gastro-esophageal reflux disease without esophagitis: Secondary | ICD-10-CM | POA: Diagnosis not present

## 2015-02-20 NOTE — Progress Notes (Signed)
Patient ID: Beth Payne, female   DOB: Nov 17, 1940, 74 y.o.   MRN: [id number]    DATE:  02/20/2015 MRN:  [id number]  BIRTHDAY: [birth year]  Facility:  Nursing Home Location:  [facility]  Nursing Home Room Number: 1104-2  LEVEL OF CARE:  SNF (31)  Contact Information    Name Relation Home Work Mobile   [place] Friend [phone]  [phone]   No name specified              Chief Complaint  Patient presents with  . Discharge Note    Left ankle fracture S/P ORIF, diabetes mellitus, hypertension, GERD, depression, anemia, constipation and hyperlipidemia     HISTORY OF PRESENT ILLNESS:  This is a 74 year old female who is for discharge home with home health PT, OT and home health aide. DME:  56' X 18" standard hemi-wheelchair with elevating leg rests and anti-tippers, wheelchair cushion and 3 in 1 bedside commode. She has been admitted to [facility] on 12/27/14 from [facility]. She has PMH of CAD, depression, retinopathy, anxiety, TIA, GERD and type 1 diabetes mellitus. She had fracture of the left ankle for which she had ORIF on 12/25/14 and was discharged to [facility]. After 2 days, she developed hyperglycemia with CBG of 600 and AMS. She was readmitted to [facility] on 01/01/15.  Patient was admitted to this facility for short-term rehabilitation after the patient's recent hospitalization.  Patient has completed SNF rehabilitation and therapy has cleared the patient for discharge.   PAST MEDICAL HISTORY:  Past Medical History  Diagnosis Date  . Coronary artery disease   . Depression   . Retinopathy   . Anxiety   . Heart murmur   . TIA (transient ischemic attack)   . Arthritis     "fingers" (12/23/2014)  . Fibromyalgia   . Type I diabetes mellitus dx'd 1953    "I'm on a pup" (12/23/2014)  . Chronic lower back pain   . GERD (gastroesophageal reflux disease) dx'd 1995  . Osteopenia dx'd 2007     CURRENT MEDICATIONS: Reviewed    Patient's Medications  New Prescriptions   No medications on file  Previous Medications   ACETAMINOPHEN (TYLENOL) 325 MG TABLET    Take 2 tablets (650 mg total) by mouth every 6 (six) hours as needed for mild pain (or Fever >/= 101).   AMLODIPINE (NORVASC) 5 MG TABLET    Take 1 tablet (5 mg total) by mouth daily.   ARIPIPRAZOLE (ABILIFY) 2 MG TABLET    Take 2 mg by mouth at bedtime.    ASPIRIN 325 MG EC TABLET    Take 1 tablet (325 mg total) by mouth 2 (two) times daily. For 6 weeks, then daily   ATORVASTATIN (LIPITOR) 20 MG TABLET    Take 20 mg by mouth daily.   CHOLECALCIFEROL (VITAMIN D) 1000 UNITS TABLET    Take 2,000 Units by mouth daily.   DOCUSATE SODIUM (COLACE) 100 MG CAPSULE    Take 100 mg by mouth 2 (two) times daily.   FERROUS SULFATE 325 (65 FE) MG TABLET    Take 325 mg by mouth 2 (two) times daily.   INSULIN GLARGINE (LANTUS) 100 UNIT/ML INJECTION    Inject 15 Units into the skin daily.   INSULIN LISPRO (HUMALOG) 100 UNIT/ML INJECTION    Inject 3 Units into the skin 3 (three) times daily with meals. With SSI  0-150 = 0 ; 151 - 200 = 1 unit;  201 - 250 = 2 units;  251 - 300 = 3 units ; 301 - 400 = 5 units   MULTIPLE VITAMIN (MULITIVITAMIN WITH MINERALS) TABS    Take 1 tablet by mouth daily.   OMEPRAZOLE (PRILOSEC) 20 MG CAPSULE    Take 40 mg by mouth daily.   POLYETHYLENE GLYCOL (MIRALAX / GLYCOLAX) PACKET    Take 17 g by mouth daily as needed for mild constipation.   SERTRALINE (ZOLOFT) 100 MG TABLET    Take 150 mg by mouth daily.  Modified Medications   No medications on file  Discontinued Medications   INSULIN ASPART (NOVOLOG) 100 UNIT/ML INJECTION    Inject 0-6 Units into the skin 3 (three) times daily with meals. 121-150=0 units 151-200=1 unit 201-250=2 units  251-300=3 units 301-350=4 units 351-400=6 units >400 give 6 units and call MD   INSULIN GLARGINE (LANTUS) 100 UNIT/ML INJECTION    Inject 0.12 mLs (12 Units total) into the skin daily.   ROSUVASTATIN (CRESTOR)  5 MG TABLET    Take 5 mg by mouth daily.     Allergies  Allergen Reactions  . Diclofenac Sodium Swelling and Other (See Comments)    Blurred vision  . Doxycycline Calcium Swelling and Other (See Comments)    Blurred vision  . Codeine Nausea And Vomiting and Other (See Comments)    Dizziness   . Zocor [Simvastatin] Other (See Comments)    Dizziness, weakness      REVIEW OF SYSTEMS:  GENERAL: no change in appetite, no fatigue, no weight changes, no fever, chills or weakness EYES: Denies change in vision, dry eyes, eye pain, itching or discharge EARS: Denies change in hearing, ringing in ears, or earache NOSE: Denies nasal congestion or epistaxis MOUTH and THROAT: Denies oral discomfort, gingival pain or bleeding, pain from teeth or hoarseness   RESPIRATORY: no cough, SOB, DOE, wheezing, hemoptysis CARDIAC: no chest pain, edema or palpitations GI: no abdominal pain, diarrhea, constipation, heart burn, nausea or vomiting GU: Denies dysuria, frequency, hematuria, incontinence, or discharge PSYCHIATRIC: Denies feeling of depression or anxiety. No report of hallucinations, insomnia, paranoia, or agitation   PHYSICAL EXAMINATION  GENERAL APPEARANCE: Well nourished. In no acute distress. Normal body habitus HEAD: Normal in size and contour. No evidence of trauma EYES: Lids open and close normally. No blepharitis, entropion or ectropion. PERRL. Conjunctivae are clear and sclerae are white. Lenses are without opacity EARS: Pinnae are normal. Patient hears normal voice tunes of the examiner MOUTH and THROAT: Lips are without lesions. Oral mucosa is moist and without lesions. Tongue is normal in shape, size, and color and without lesions NECK: supple, trachea midline, no neck masses, no thyroid tenderness, no thyromegaly LYMPHATICS: no LAN in the neck, no supraclavicular LAN RESPIRATORY: breathing is even & unlabored, BS CTAB CARDIAC: RRR, no murmur,no extra heart sounds, no edema GI:  abdomen soft, normal BS, no masses, no tenderness, no hepatomegaly, no splenomegaly EXTREMITIES:  Able to move 4 extremities; left cam boot PSYCHIATRIC: Alert and oriented X 3. Affect and behavior are appropriate  LABS/RADIOLOGY: Labs reviewed: 02/14/15  sodium 137 potassium 4.6 glucose 90 BUN 22 creatinine 1.23 calcium 10.4 02/11/15  sodium 138 potassium 4.5 glucose 61 BUN 17 creatinine 0.94 calcium 10.3 02/07/15  sodium 138 potassium 4.7 glucose 83 BUN 22 creatinine 1.17 calcium 10.1 01/16/15  WBC 3.3 hemoglobin 9.7 hematocrit 29.9 MCV 85.4 sodium 140 potassium 4.0 glucose 184 BUN 8 creatinine 0.65 calcium 8.8 Basic Metabolic Panel:  Recent Labs  16/10/96 1320  12/26/14 0340 12/31/14 2052 01/01/15 0535 01/01/15 0911  NA 135  < > 137 137 138  --   K 5.0  < > 3.2* 3.2* 3.4*  --   CL 103  < > 104 101 106  --   CO2 14*  < > 21* 22 22  --   GLUCOSE 560*  < > 240* 200* 146*  --   BUN 27*  < > <5* 24* 21*  --   CREATININE 1.72*  < > 0.73 1.11* 0.83  --   CALCIUM 9.6  < > 8.9 9.9 9.3  --   MG 1.9  --   --   --   --  1.7  PHOS 4.0  --   --   --   --   --   < > = values in this interval not displayed. Liver Function Tests:  Recent Labs  12/12/14 1010 12/31/14 2052  AST 27 29  ALT 25 15  ALKPHOS 60 70  BILITOT 1.1 0.4  PROT 6.9 5.9*  ALBUMIN 4.2 3.1*    Recent Labs  12/12/14 1010  LIPASE 21*    Recent Labs  12/31/14 2056  AMMONIA 17   CBC:  Recent Labs  12/12/14 1010  12/23/14 1042  12/26/14 0340 12/31/14 2052 01/01/15 0535  WBC 7.5  < > 7.8  < > 7.9 7.7 11.2*  NEUTROABS 6.2  --  6.2  --   --   --   --   HGB 10.4*  < > 10.6*  < > 9.0* 8.6* 9.0*  HCT 32.1*  < > 32.2*  < > 27.5* 25.5* 25.8*  MCV 85.4  < > 83.6  < > 83.8 81.2 80.6  PLT 196  < > 304  < > 295 447* 439*  < > = values in this interval not displayed.  Cardiac Enzymes:  Recent Labs  12/13/14 1309 12/13/14 1827 12/14/14 0007  TROPONINI 0.85* 0.79* 0.67*   CBG:  Recent Labs  01/01/15 1215  01/01/15 1509 01/01/15 1617  GLUCAP 223* 241* 224*    ASSESSMENT/PLAN:  Left ankle fracture S/P ORIF - continue Tylenol 325 mg 2 tabs = 650 mg by mouth every 6 hours when necessary  Diabetes mellitus, type I -  hemoglobin A1c 6.6; continue Lantus 15 units subcutaneous daily, Humalog 3 units subcutaneous with each meal and Humalog SSI TID  Hypertension - well controlled; continue Norvasc 5 mg 1 tab by mouth daily  GERD - continue Prilosec 40 mg 1 capsule by mouth daily  Major depression - continue Zoloft 100 mg 1/1/2 tab = 150 mg by mouth daily and Abilify 2 mg 1 tab by mouth daily at bedtime  Anemia, acute blood loss - hemoglobin 9.7 ; ferrous sulfate 325 mg 1 tab by mouth twice a day  Constipation - continue Colace 100 mg 1 capsule by mouth twice a day and MiraLAX 17 g by mouth daily when necessary  Hyperlipidemia - continue Lipitor 20 mg 1 tab by mouth daily     I have filled out patient's discharge paperwork and written prescriptions.  Patient will receive home health PT, OT, ST, nursing and CNA.  DME provided: 22' X 18" standard hemi-wheelchair with elevating leg rests and anti-tippers, wheelchair cushion and 3 in 1 bedside commode  Total discharge time: Greater than 30 minutes  Discharge time involved coordination of the discharge process with social worker, nursing staff and therapy department. Medical justification for home health services/DME verified.  [facility], NP [employer] [phone]

## 2015-02-23 ENCOUNTER — Inpatient Hospital Stay (HOSPITAL_COMMUNITY)
Admission: EM | Admit: 2015-02-23 | Discharge: 2015-02-27 | DRG: 871 | Disposition: A | Discharge status: Skilled Nursing Facility | Payer: Medicare Other | Attending: Internal Medicine | Admitting: Internal Medicine

## 2015-02-23 ENCOUNTER — Emergency Department (HOSPITAL_COMMUNITY): Payer: Medicare Other

## 2015-02-23 ENCOUNTER — Encounter (HOSPITAL_COMMUNITY): Payer: Self-pay

## 2015-02-23 DIAGNOSIS — F0390 Unspecified dementia without behavioral disturbance: Secondary | ICD-10-CM | POA: Diagnosis present

## 2015-02-23 DIAGNOSIS — R7309 Other abnormal glucose: Secondary | ICD-10-CM | POA: Diagnosis not present

## 2015-02-23 DIAGNOSIS — R6520 Severe sepsis without septic shock: Secondary | ICD-10-CM | POA: Diagnosis not present

## 2015-02-23 DIAGNOSIS — Z7982 Long term (current) use of aspirin: Secondary | ICD-10-CM

## 2015-02-23 DIAGNOSIS — N179 Acute kidney failure, unspecified: Secondary | ICD-10-CM | POA: Diagnosis not present

## 2015-02-23 DIAGNOSIS — R6510 Systemic inflammatory response syndrome (SIRS) of non-infectious origin without acute organ dysfunction: Secondary | ICD-10-CM | POA: Diagnosis present

## 2015-02-23 DIAGNOSIS — F339 Major depressive disorder, recurrent, unspecified: Secondary | ICD-10-CM | POA: Diagnosis not present

## 2015-02-23 DIAGNOSIS — Z8673 Personal history of transient ischemic attack (TIA), and cerebral infarction without residual deficits: Secondary | ICD-10-CM | POA: Diagnosis not present

## 2015-02-23 DIAGNOSIS — R739 Hyperglycemia, unspecified: Secondary | ICD-10-CM | POA: Diagnosis not present

## 2015-02-23 DIAGNOSIS — M6281 Muscle weakness (generalized): Secondary | ICD-10-CM | POA: Diagnosis not present

## 2015-02-23 DIAGNOSIS — E876 Hypokalemia: Secondary | ICD-10-CM | POA: Diagnosis present

## 2015-02-23 DIAGNOSIS — E10319 Type 1 diabetes mellitus with unspecified diabetic retinopathy without macular edema: Secondary | ICD-10-CM | POA: Diagnosis present

## 2015-02-23 DIAGNOSIS — R531 Weakness: Secondary | ICD-10-CM | POA: Diagnosis not present

## 2015-02-23 DIAGNOSIS — I10 Essential (primary) hypertension: Secondary | ICD-10-CM | POA: Diagnosis present

## 2015-02-23 DIAGNOSIS — M797 Fibromyalgia: Secondary | ICD-10-CM | POA: Diagnosis present

## 2015-02-23 DIAGNOSIS — G9341 Metabolic encephalopathy: Secondary | ICD-10-CM | POA: Diagnosis present

## 2015-02-23 DIAGNOSIS — S82892D Other fracture of left lower leg, subsequent encounter for closed fracture with routine healing: Secondary | ICD-10-CM

## 2015-02-23 DIAGNOSIS — Z9641 Presence of insulin pump (external) (internal): Secondary | ICD-10-CM | POA: Diagnosis present

## 2015-02-23 DIAGNOSIS — R262 Difficulty in walking, not elsewhere classified: Secondary | ICD-10-CM | POA: Diagnosis not present

## 2015-02-23 DIAGNOSIS — E785 Hyperlipidemia, unspecified: Secondary | ICD-10-CM | POA: Diagnosis present

## 2015-02-23 DIAGNOSIS — Z955 Presence of coronary angioplasty implant and graft: Secondary | ICD-10-CM

## 2015-02-23 DIAGNOSIS — F419 Anxiety disorder, unspecified: Secondary | ICD-10-CM | POA: Diagnosis not present

## 2015-02-23 DIAGNOSIS — E0810 Diabetes mellitus due to underlying condition with ketoacidosis without coma: Secondary | ICD-10-CM | POA: Diagnosis not present

## 2015-02-23 DIAGNOSIS — E119 Type 2 diabetes mellitus without complications: Secondary | ICD-10-CM | POA: Diagnosis not present

## 2015-02-23 DIAGNOSIS — F17210 Nicotine dependence, cigarettes, uncomplicated: Secondary | ICD-10-CM | POA: Diagnosis present

## 2015-02-23 DIAGNOSIS — Z90710 Acquired absence of both cervix and uterus: Secondary | ICD-10-CM | POA: Diagnosis not present

## 2015-02-23 DIAGNOSIS — D649 Anemia, unspecified: Secondary | ICD-10-CM | POA: Diagnosis present

## 2015-02-23 DIAGNOSIS — G9340 Encephalopathy, unspecified: Secondary | ICD-10-CM | POA: Diagnosis present

## 2015-02-23 DIAGNOSIS — A419 Sepsis, unspecified organism: Secondary | ICD-10-CM | POA: Diagnosis not present

## 2015-02-23 DIAGNOSIS — Z800 Family history of malignant neoplasm of digestive organs: Secondary | ICD-10-CM | POA: Diagnosis not present

## 2015-02-23 DIAGNOSIS — M8580 Other specified disorders of bone density and structure, unspecified site: Secondary | ICD-10-CM | POA: Diagnosis present

## 2015-02-23 DIAGNOSIS — R1311 Dysphagia, oral phase: Secondary | ICD-10-CM | POA: Diagnosis not present

## 2015-02-23 DIAGNOSIS — I25111 Atherosclerotic heart disease of native coronary artery with angina pectoris with documented spasm: Secondary | ICD-10-CM

## 2015-02-23 DIAGNOSIS — E1010 Type 1 diabetes mellitus with ketoacidosis without coma: Secondary | ICD-10-CM | POA: Diagnosis present

## 2015-02-23 DIAGNOSIS — M545 Low back pain: Secondary | ICD-10-CM | POA: Diagnosis not present

## 2015-02-23 DIAGNOSIS — K219 Gastro-esophageal reflux disease without esophagitis: Secondary | ICD-10-CM | POA: Diagnosis present

## 2015-02-23 DIAGNOSIS — Z794 Long term (current) use of insulin: Secondary | ICD-10-CM | POA: Diagnosis not present

## 2015-02-23 DIAGNOSIS — E1051 Type 1 diabetes mellitus with diabetic peripheral angiopathy without gangrene: Secondary | ICD-10-CM | POA: Diagnosis present

## 2015-02-23 DIAGNOSIS — Z5189 Encounter for other specified aftercare: Secondary | ICD-10-CM | POA: Diagnosis not present

## 2015-02-23 DIAGNOSIS — E1110 Type 2 diabetes mellitus with ketoacidosis without coma: Secondary | ICD-10-CM | POA: Diagnosis present

## 2015-02-23 DIAGNOSIS — I129 Hypertensive chronic kidney disease with stage 1 through stage 4 chronic kidney disease, or unspecified chronic kidney disease: Secondary | ICD-10-CM | POA: Diagnosis not present

## 2015-02-23 DIAGNOSIS — E1069 Type 1 diabetes mellitus with other specified complication: Secondary | ICD-10-CM | POA: Diagnosis not present

## 2015-02-23 DIAGNOSIS — S82899A Other fracture of unspecified lower leg, initial encounter for closed fracture: Secondary | ICD-10-CM | POA: Diagnosis present

## 2015-02-23 DIAGNOSIS — I2510 Atherosclerotic heart disease of native coronary artery without angina pectoris: Secondary | ICD-10-CM | POA: Diagnosis present

## 2015-02-23 DIAGNOSIS — R278 Other lack of coordination: Secondary | ICD-10-CM | POA: Diagnosis not present

## 2015-02-23 LAB — BLOOD GAS, VENOUS

## 2015-02-23 LAB — COMPREHENSIVE METABOLIC PANEL
ALK PHOS: 87 U/L (ref 38–126)
ALT: 24 U/L (ref 14–54)
AST: 31 U/L (ref 15–41)
Albumin: 4 g/dL (ref 3.5–5.0)
Anion gap: 18 — ABNORMAL HIGH (ref 5–15)
BUN: 36 mg/dL — ABNORMAL HIGH (ref 6–20)
CALCIUM: 9.4 mg/dL (ref 8.9–10.3)
CO2: 14 mmol/L — AB (ref 22–32)
CREATININE: 1.71 mg/dL — AB (ref 0.44–1.00)
Chloride: 105 mmol/L (ref 101–111)
GFR calc Af Amer: 33 mL/min — ABNORMAL LOW (ref >60)
GFR, EST NON AFRICAN AMERICAN: 28 mL/min — AB (ref >60)
Glucose, Bld: 586 mg/dL (ref 65–99)
Potassium: 3.8 mmol/L (ref 3.5–5.1)
Sodium: 137 mmol/L (ref 135–145)
Total Bilirubin: 0.9 mg/dL (ref 0.3–1.2)
Total Protein: 6.9 g/dL (ref 6.5–8.1)

## 2015-02-23 LAB — CBC WITH DIFFERENTIAL/PLATELET
BASOS ABS: 0 10*3/uL (ref 0.0–0.1)
BASOS PCT: 0 %
EOS ABS: 0 10*3/uL (ref 0.0–0.7)
EOS PCT: 0 %
HCT: 31.6 % — ABNORMAL LOW (ref 36.0–46.0)
Hemoglobin: 10.2 g/dL — ABNORMAL LOW (ref 12.0–15.0)
LYMPHS PCT: 7 %
Lymphs Abs: 1.5 10*3/uL (ref 0.7–4.0)
MCH: 27.5 pg (ref 26.0–34.0)
MCHC: 32.3 g/dL (ref 30.0–36.0)
MCV: 85.2 fL (ref 78.0–100.0)
MONO ABS: 2.2 10*3/uL — AB (ref 0.1–1.0)
Monocytes Relative: 11 %
Neutro Abs: 16.6 10*3/uL — ABNORMAL HIGH (ref 1.7–7.7)
Neutrophils Relative %: 82 %
PLATELETS: 288 10*3/uL (ref 150–400)
RBC: 3.71 MIL/uL — AB (ref 3.87–5.11)
RDW: 15.8 % — AB (ref 11.5–15.5)
WBC: 20.3 10*3/uL — AB (ref 4.0–10.5)

## 2015-02-23 LAB — URINALYSIS, ROUTINE W REFLEX MICROSCOPIC
Bilirubin Urine: NEGATIVE
Glucose, UA: >1000 mg/dL — AB
Hgb urine dipstick: NEGATIVE
Ketones, ur: 40 mg/dL — AB
Leukocytes, UA: NEGATIVE
Nitrite: NEGATIVE
Protein, ur: NEGATIVE mg/dL
Specific Gravity, Urine: 1.023 (ref 1.005–1.030)
Urobilinogen, UA: 0.2 mg/dL (ref 0.0–1.0)
pH: 5 (ref 5.0–8.0)

## 2015-02-23 LAB — I-STAT VENOUS BLOOD GAS, ED
Acid-base deficit: 13 mmol/L — ABNORMAL HIGH (ref 0.0–2.0)
BICARBONATE: 12.9 meq/L — AB (ref 20.0–24.0)
O2 Saturation: 77 %
PCO2 VEN: 29.8 mmHg — AB (ref 45.0–50.0)
PH VEN: 7.243 — AB (ref 7.250–7.300)
PO2 VEN: 48 mmHg — AB (ref 30.0–45.0)
TCO2: 14 mmol/L (ref 0–100)

## 2015-02-23 LAB — CBG MONITORING, ED
GLUCOSE-CAPILLARY: 110 mg/dL — AB (ref 65–99)
GLUCOSE-CAPILLARY: 211 mg/dL — AB (ref 65–99)
GLUCOSE-CAPILLARY: 239 mg/dL — AB (ref 65–99)
GLUCOSE-CAPILLARY: 289 mg/dL — AB (ref 65–99)
GLUCOSE-CAPILLARY: 456 mg/dL — AB (ref 65–99)
GLUCOSE-CAPILLARY: 590 mg/dL — AB (ref 65–99)
GLUCOSE-CAPILLARY: 86 mg/dL (ref 65–99)
Glucose-Capillary: 379 mg/dL — ABNORMAL HIGH (ref 65–99)
Glucose-Capillary: 349 mg/dL — ABNORMAL HIGH (ref 65–99)
Glucose-Capillary: 147 mg/dL — ABNORMAL HIGH (ref 65–99)

## 2015-02-23 LAB — BASIC METABOLIC PANEL
Anion gap: 10 (ref 5–15)
Anion gap: 12 (ref 5–15)
BUN: 23 mg/dL — AB (ref 6–20)
BUN: 20 mg/dL (ref 6–20)
CHLORIDE: 108 mmol/L (ref 101–111)
CHLORIDE: 110 mmol/L (ref 101–111)
CO2: 20 mmol/L — ABNORMAL LOW (ref 22–32)
CO2: 18 mmol/L — ABNORMAL LOW (ref 22–32)
CREATININE: 1.08 mg/dL — AB (ref 0.44–1.00)
Calcium: 8.7 mg/dL — ABNORMAL LOW (ref 8.9–10.3)
Calcium: 8.8 mg/dL — ABNORMAL LOW (ref 8.9–10.3)
Creatinine, Ser: 0.94 mg/dL (ref 0.44–1.00)
GFR calc non Af Amer: 59 mL/min — ABNORMAL LOW (ref >60)
GFR, EST AFRICAN AMERICAN: 58 mL/min — AB (ref >60)
GFR, EST NON AFRICAN AMERICAN: 50 mL/min — AB (ref >60)
Glucose, Bld: 281 mg/dL — ABNORMAL HIGH (ref 65–99)
Glucose, Bld: 142 mg/dL — ABNORMAL HIGH (ref 65–99)
POTASSIUM: 3.2 mmol/L — AB (ref 3.5–5.1)
Potassium: 3.9 mmol/L (ref 3.5–5.1)
SODIUM: 138 mmol/L (ref 135–145)
SODIUM: 140 mmol/L (ref 135–145)

## 2015-02-23 LAB — I-STAT CG4 LACTIC ACID, ED
LACTIC ACID, VENOUS: 4.63 mmol/L — AB (ref 0.5–2.0)
LACTIC ACID, VENOUS: 2.68 mmol/L — AB (ref 0.5–2.0)

## 2015-02-23 LAB — URINE MICROSCOPIC-ADD ON

## 2015-02-23 LAB — APTT: aPTT: 24 seconds (ref 24–37)

## 2015-02-23 LAB — PROCALCITONIN: Procalcitonin: 14.69 ng/mL

## 2015-02-23 LAB — PROTIME-INR
INR: 1.15 (ref 0.00–1.49)
PROTHROMBIN TIME: 14.9 s (ref 11.6–15.2)

## 2015-02-23 LAB — LACTIC ACID, PLASMA: LACTIC ACID, VENOUS: 2.7 mmol/L — AB (ref 0.5–2.0)

## 2015-02-23 MED ORDER — ROSUVASTATIN CALCIUM 5 MG PO TABS
5.0000 mg | ORAL_TABLET | Freq: Every day | ORAL | Status: DC
  Administered 2015-02-24 – 2015-02-27 (×4): 5 mg via ORAL
  Filled 2015-02-23 (×6): qty 1

## 2015-02-23 MED ORDER — INSULIN ASPART 100 UNIT/ML ~~LOC~~ SOLN: 0.0000 [IU] | SUBCUTANEOUS | Status: DC

## 2015-02-23 MED ORDER — PANTOPRAZOLE SODIUM 40 MG PO TBEC
40.0000 mg | DELAYED_RELEASE_TABLET | Freq: Every day | ORAL | Status: DC
  Administered 2015-02-25 – 2015-02-26 (×2): 40 mg via ORAL
  Filled 2015-02-23 (×3): qty 1

## 2015-02-23 MED ORDER — SERTRALINE HCL 50 MG PO TABS
150.0000 mg | ORAL_TABLET | Freq: Every day | ORAL | Status: DC
  Administered 2015-02-25 – 2015-02-27 (×2): 150 mg via ORAL
  Filled 2015-02-23 (×5): qty 1

## 2015-02-23 MED ORDER — VANCOMYCIN HCL IN DEXTROSE 1-5 GM/200ML-% IV SOLN
1000.0000 mg | Freq: Once | INTRAVENOUS | Status: AC
  Administered 2015-02-23: 1000 mg via INTRAVENOUS
  Filled 2015-02-23: qty 200

## 2015-02-23 MED ORDER — PRAZOSIN HCL 1 MG PO CAPS
1.0000 mg | ORAL_CAPSULE | Freq: Every day | ORAL | Status: DC
  Administered 2015-02-24 – 2015-02-27 (×4): 1 mg via ORAL
  Filled 2015-02-23 (×6): qty 1

## 2015-02-23 MED ORDER — PIPERACILLIN-TAZOBACTAM 3.375 G IVPB
3.3750 g | Freq: Three times a day (TID) | INTRAVENOUS | Status: DC
  Administered 2015-02-24 – 2015-02-25 (×4): 3.375 g via INTRAVENOUS
  Filled 2015-02-23 (×7): qty 50

## 2015-02-23 MED ORDER — PIPERACILLIN-TAZOBACTAM 3.375 G IVPB 30 MIN
3.3750 g | Freq: Once | INTRAVENOUS | Status: AC
  Administered 2015-02-23: 3.375 g via INTRAVENOUS
  Filled 2015-02-23: qty 50

## 2015-02-23 MED ORDER — LAMOTRIGINE 25 MG PO TABS
50.0000 mg | ORAL_TABLET | Freq: Every day | ORAL | Status: DC
  Administered 2015-02-24 – 2015-02-26 (×3): 50 mg via ORAL
  Filled 2015-02-23 (×4): qty 2

## 2015-02-23 MED ORDER — LORAZEPAM 0.5 MG PO TABS
0.5000 mg | ORAL_TABLET | Freq: Three times a day (TID) | ORAL | Status: DC
  Administered 2015-02-23 – 2015-02-27 (×9): 0.5 mg via ORAL
  Filled 2015-02-23 (×9): qty 1

## 2015-02-23 MED ORDER — VITAMIN D 1000 UNITS PO TABS
2000.0000 [IU] | ORAL_TABLET | Freq: Every day | ORAL | Status: DC
  Administered 2015-02-25 – 2015-02-26 (×2): 2000 [IU] via ORAL
  Filled 2015-02-23 (×3): qty 2

## 2015-02-23 MED ORDER — SODIUM CHLORIDE 0.9 % IV SOLN
INTRAVENOUS | Status: DC
  Administered 2015-02-23: 3.2 [IU]/h via INTRAVENOUS
  Filled 2015-02-23: qty 2.5

## 2015-02-23 MED ORDER — SODIUM CHLORIDE 0.9 % IV BOLUS (SEPSIS)
1000.0000 mL | INTRAVENOUS | Status: AC
  Administered 2015-02-23 (×2): 1000 mL via INTRAVENOUS

## 2015-02-23 MED ORDER — SODIUM CHLORIDE 0.9 % IV BOLUS (SEPSIS)
500.0000 mL | Freq: Once | INTRAVENOUS | Status: AC
Start: 2015-02-23 — End: 2015-02-23
  Administered 2015-02-23: 500 mL via INTRAVENOUS

## 2015-02-23 MED ORDER — SODIUM CHLORIDE 0.9 % IV SOLN: INTRAVENOUS | Status: DC

## 2015-02-23 MED ORDER — SODIUM CHLORIDE 0.9 % IV SOLN
500.0000 mg | INTRAVENOUS | Status: DC
  Administered 2015-02-24: 500 mg via INTRAVENOUS
  Filled 2015-02-23 (×3): qty 500

## 2015-02-23 MED ORDER — DEXTROSE-NACL 5-0.45 % IV SOLN
INTRAVENOUS | Status: DC
  Administered 2015-02-23: 19:00:00 via INTRAVENOUS

## 2015-02-23 MED ORDER — DEXTROSE-NACL 5-0.45 % IV SOLN
INTRAVENOUS | Status: DC
  Administered 2015-02-23: 22:00:00 via INTRAVENOUS

## 2015-02-23 MED ORDER — POTASSIUM CHLORIDE 10 MEQ/100ML IV SOLN
10.0000 meq | INTRAVENOUS | Status: AC
  Administered 2015-02-23 (×2): 10 meq via INTRAVENOUS
  Filled 2015-02-23 (×2): qty 100

## 2015-02-23 NOTE — H&P (Signed)
[facility] Admission History and Physical       Beth Payne ZOX:[id number] DOB: Aug 16, 1940 DOA: 02/23/2015  Referring physician: EDP PCP: [name], MD  Specialists:   Chief Complaint: Confusion, Fever and Chills  HPI: Beth Payne is a 74 y.o. female with a history of DM-1 S/P Insulin Pump, CAD and recent ORIF Left Ankle who presents to the ED with complaints of lethargy and increasing confusion along with fevers and chills and increased blood sugars.   She was evaluated in the ED and was found to have a glucose level of 590 and was found to have DKA.  She was placed on the DKA protocol and a Sepsis workup was also initiated.   She was placed on empiric IV Vancomycin and Zosyn.     Review of Systems: Unable to Obtain from the Patient    Past Medical History  Diagnosis Date  . Coronary artery disease   . Depression   . Retinopathy   . Anxiety   . Heart murmur   . TIA (transient ischemic attack)   . Arthritis     "fingers" (12/23/2014)  . Fibromyalgia   . Type I diabetes mellitus dx'd 1953    "I'm on a pup" (12/23/2014)  . Chronic lower back pain   . GERD (gastroesophageal reflux disease) dx'd 1995  . Osteopenia dx'd 2007     Past Surgical History  Procedure Laterality Date  . Cataract extraction w/ intraocular lens  implant, bilateral Bilateral 1998  . Tonsillectomy    . Appendectomy    . Bilateral carpal tunnel release Bilateral 2003-2004  . Back surgery    . Anterior cervical decomp/discectomy fusion  05/2003    "C5-6"  . Abdominal hysterectomy  1972  . Dilation and curettage of uterus    . Cesarean section    . Eye surgery Bilateral since 1977    "numerous slaser surgeries for retinopathy"  . Trigger finger release Bilateral 2003  . Cervical disc surgery  2004    "collapsed C-5"  . Fracture surgery    . Patella fracture surgery Left 04/2007  . Vertebroplasty  01/2008    "compressed lumbar fracture"  . Laparoscopic lysis of  adhesions  1975-1987 X 5  . Exploratory laparotomy  8119-1478 X 1    "adhesions"  . Oophorectomy Bilateral 1980's  . Vitrectomy Right 08/1997  . Bunionectomy with hammertoe reconstruction Right 02/1999  . Coronary angioplasty with stent placement  07/1999    "?1"  . Lumbar microdiscectomy  03/2005  . Orif ankle fracture Left 12/25/2014    Procedure: OPEN REDUCTION INTERNAL FIXATION (ORIF) LEFT TRIMALLEOLAR ANKLE FRACTURE;  Surgeon: Tarry Kos, MD;  Location: [facility] OR;  Service: Orthopedics;  Laterality: Left;      Prior to Admission medications   Medication Sig Start Date End Date Taking? Authorizing Provider  acetaminophen (TYLENOL) 325 MG tablet Take 2 tablets (650 mg total) by mouth every 6 (six) hours as needed for mild pain (or Fever >/= 101). 12/27/14  Yes Christiane Ha, MD  ARIPiprazole (ABILIFY) 5 MG tablet Take 5 mg by mouth daily.   Yes Historical Provider, MD  aspirin 325 MG EC tablet Take 1 tablet (325 mg total) by mouth 2 (two) times daily. For 6 weeks, then daily 12/27/14  Yes Christiane Ha, MD  cholecalciferol (VITAMIN D) 1000 UNITS tablet Take 2,000 Units by mouth daily.   Yes Historical Provider, MD  docusate sodium (COLACE) 100 MG capsule Take 100 mg by  mouth 2 (two) times daily.   Yes Historical Provider, MD  donepezil (ARICEPT) 10 MG tablet Take 10 mg by mouth at bedtime.   Yes Historical Provider, MD  ferrous sulfate 325 (65 FE) MG tablet Take 325 mg by mouth 2 (two) times daily.   Yes Historical Provider, MD  insulin lispro (HUMALOG) 100 UNIT/ML injection Inject 3 Units into the skin 3 (three) times daily with meals. With SSI  0-150 = 0 ; 151 - 200 = 1 unit;  201 - 250 = 2 units;  251 - 300 = 3 units ; 301 - 400 = 5 units   Yes Historical Provider, MD  lamoTRIgine (LAMICTAL) 100 MG tablet Take 50 mg by mouth daily.   Yes Historical Provider, MD  LORazepam (ATIVAN) 0.5 MG tablet Take 0.5 mg by mouth every 8 (eight) hours. Anxiety   Yes Historical Provider, MD    Multiple Vitamin (MULITIVITAMIN WITH MINERALS) TABS Take 1 tablet by mouth daily.   Yes Historical Provider, MD  omeprazole (PRILOSEC) 20 MG capsule Take 40 mg by mouth daily.   Yes Historical Provider, MD  polyethylene glycol (MIRALAX / GLYCOLAX) packet Take 17 g by mouth daily as needed for mild constipation. 12/27/14  Yes Christiane Ha, MD  prazosin (MINIPRESS) 1 MG capsule Take 1 mg by mouth daily.   Yes Historical Provider, MD  rosuvastatin (CRESTOR) 10 MG tablet Take 5 mg by mouth daily.   Yes Historical Provider, MD  sertraline (ZOLOFT) 100 MG tablet Take 150 mg by mouth daily.   Yes Historical Provider, MD  amLODipine (NORVASC) 5 MG tablet Take 1 tablet (5 mg total) by mouth daily. Patient not taking: Reported on 02/23/2015 01/01/15   Rodolph Bong, MD  ARIPiprazole (ABILIFY) 2 MG tablet Take 2 mg by mouth at bedtime.     Historical Provider, MD  atorvastatin (LIPITOR) 20 MG tablet Take 20 mg by mouth daily.    Historical Provider, MD  insulin glargine (LANTUS) 100 UNIT/ML injection Inject 15 Units into the skin daily.    Historical Provider, MD     Allergies  Allergen Reactions  . Diclofenac Sodium Swelling and Other (See Comments)    Blurred vision  . Doxycycline Calcium Swelling and Other (See Comments)    Blurred vision  . Codeine Nausea And Vomiting and Other (See Comments)    Dizziness   . Zocor [Simvastatin] Other (See Comments)    Dizziness, weakness     Social History:  reports that she has been smoking Cigarettes.  She has a 27 pack-year smoking history. She has never used smokeless tobacco. She reports that she does not drink alcohol or use illicit drugs.    Family History  Problem Relation Age of Onset  . Liver disease Sister   . Colon cancer Brother        Physical Exam:  GEN:  Pleasant Obtunded 74 y.o. African American  female examined and in no acute distress; cooperative with exam Filed Vitals:   02/23/15 2000 02/23/15 2015 02/23/15 2030 02/23/15  2045  BP: 159/59 137/81 131/80   Pulse: 96 96 90   Temp:    100.2 F (37.9 C)  TempSrc:    Rectal  Resp:  22 23   SpO2: 100% 100% 100%    Blood pressure 131/80, pulse 90, temperature 100.2 F (37.9 C), temperature source Rectal, resp. rate 23, SpO2 100 %. PSYCH: She is alert and oriented x 1; does not appear anxious does not appear depressed; affect  is normal HEENT: Normocephalic and Atraumatic, Mucous membranes pink; PERRLA; EOM intact; Fundi:  Benign;  No scleral icterus, Nares: Patent, Oropharynx: Clear, Fair Dentition,    Neck:  FROM, No Cervical Lymphadenopathy nor Thyromegaly or Carotid Bruit; No JVD;Ed Breasts:: Not examined CHEST WALL: No tenderness CHEST: Normal respiration, clear to auscultation bilaterally HEART: Regular rate and rhythm; no murmurs rubs or gallops BACK: No kyphosis or scoliosis; No CVA tenderness ABDOMEN: Positive Bowel Sounds, Scaphoid, Soft Non-Tender, No Rebound or Guarding; No Masses, No Organomegaly Rectal Exam: Not done EXTREMITIES: No Cyanosis, Clubbing, or Edema; No Ulcerations. Genitalia: not examined PULSES: 2+ and symmetric SKIN: Normal hydration no rash or ulceration CNS:  Alert and Oriented x 1, No Focal Deficits Vascular: pulses palpable throughout    Labs on Admission:  Basic Metabolic Panel:  Recent Labs Lab 02/23/15 1427 02/23/15 1902  NA 137 138  K 3.8 3.9  CL 105 108  CO2 14* 20*  GLUCOSE 586* 281*  BUN 36* 23*  CREATININE 1.71* 1.08*  CALCIUM 9.4 8.7*   Liver Function Tests:  Recent Labs Lab 02/23/15 1427  AST 31  ALT 24  ALKPHOS 87  BILITOT 0.9  PROT 6.9  ALBUMIN 4.0   No results for input(s): LIPASE, AMYLASE in the last 168 hours. No results for input(s): AMMONIA in the last 168 hours. CBC:  Recent Labs Lab 02/23/15 1428  WBC 20.3*  NEUTROABS 16.6*  HGB 10.2*  HCT 31.6*  MCV 85.2  PLT 288   Cardiac Enzymes: No results for input(s): CKTOTAL, CKMB, CKMBINDEX, TROPONINI in the last 168 hours.  BNP  (last 3 results) No results for input(s): BNP in the last 8760 hours.  ProBNP (last 3 results) No results for input(s): PROBNP in the last 8760 hours.  CBG:  Recent Labs Lab 02/23/15 1555 02/23/15 1701 02/23/15 1805 02/23/15 1919 02/23/15 2026  GLUCAP 379* 349* 289* 239* 211*    Radiological Exams on Admission: Dg Chest Port 1 View  02/23/2015   CLINICAL DATA:  Weakness. History of coronary artery disease, TIA and diabetes.  EXAM: PORTABLE CHEST - 1 VIEW  COMPARISON:  12/31/2014  FINDINGS: Cardiac silhouette is normal in size and configuration. No mediastinal or hilar masses or evidence of adenopathy. Clear lungs. No pleural effusion or pneumothorax.  Bony thorax is grossly intact.  IMPRESSION: No acute cardiopulmonary disease.   Electronically Signed   By: Amie Portland M.D.   On: 02/23/2015 14:42     EKG: Independently reviewed. Normal Sinus Rhythm rate =94 +PVC   Assessment/Plan:   74 y.o. female with  Principal Problem:   1.    SIRS (systemic inflammatory response syndrome)   Sepsis Protocol   IV Vancomycin and Zosyn   IVFs   Monitor Electrolytes     Active Problems:   2.    DKA (diabetic ketoacidoses)   DKA Protocol with IV Insulin   IVFs     3.    Acute encephalopathy- Metabolic Etiology   Monitor     4.    AKI (acute kidney injury)   IVFs   Monitor BUN/Cr     4.    CAD s/p stent to mid-RCA in 2001.        5.    DM (diabetes mellitus), type 1 with complications- Has an Insulin Pump   Transition to Insulin Rx   SSI coverage        6.    Fracture, ankle- S/P ORIF  7.    Normocytic anemia   Monitor Trend     8.    DVT Prophylaxis   Lovenox          Code Status:     FULL CODE    Family Communication:   Family at Bedside    Disposition Plan:    Inpatient  Status        Time spent:  57 Minutes      [name] Parker [facility] Pager [phone]   If 7AM -7PM Please Contact the Day Rounding Team MD for [facility]  If 7PM-7AM, Please Contact Night-Floor Coverage  [email or web address] Password [facility] 02/23/2015, 8:57 PM     ADDENDUM:   Patient was seen and examined on 02/23/2015

## 2015-02-23 NOTE — ED Provider Notes (Signed)
CSN: [id number]     Arrival date & time 02/23/15  1307 History   First MD Initiated Contact with Patient 02/23/15 1312     Chief Complaint  Patient presents with  . Hyperglycemia   Level V caveat secondary to confusion.  (Consider location/radiation/quality/duration/timing/severity/associated sxs/prior Treatment) HPI 74 year old female history of type 1 diabetes, coronary artery disease status post left ankle fracture with ORIF who presents today with reports of high blood sugar. Patient's friend reports her blood sugar at high at home. EMS also reports a blood sugar read high. She was in the nursing home postsurgery and has only been home for the past 4 days. Friends report that she was well last night when they left her. This morning on their arrival she appeared confused and had not been able to get up to the bathroom and had soiled her bed. She is unable to give a history that she appears somewhat confused  caregiver is here with her. He states he comes in during the day and has been giving her her medications for extended period time. However, she does stay alone at night. He states that she is getting by with a bedside commode during that time. He reports that she has had very little by mouth intake during the past 4 hours only some soup but about half a bottle of water. He had noted couple episodes of vomiting of stomach contents only. She has been more confused this a.m. Otherwise he has not noted any changes. He reports getting her her insulin injections as he has in the past. Past Medical History  Diagnosis Date  . Coronary artery disease   . Depression   . Retinopathy   . Anxiety   . Heart murmur   . TIA (transient ischemic attack)   . Arthritis     "fingers" (12/23/2014)  . Fibromyalgia   . Type I diabetes mellitus dx'd 1953    "I'm on a pup" (12/23/2014)  . Chronic lower back pain   . GERD (gastroesophageal reflux disease) dx'd 1995  . Osteopenia dx'd 2007   Past Surgical  History  Procedure Laterality Date  . Cataract extraction w/ intraocular lens  implant, bilateral Bilateral 1998  . Tonsillectomy    . Appendectomy    . Bilateral carpal tunnel release Bilateral 2003-2004  . Back surgery    . Anterior cervical decomp/discectomy fusion  05/2003    "C5-6"  . Abdominal hysterectomy  1972  . Dilation and curettage of uterus    . Cesarean section    . Eye surgery Bilateral since 1977    "numerous slaser surgeries for retinopathy"  . Trigger finger release Bilateral 2003  . Cervical disc surgery  2004    "collapsed C-5"  . Fracture surgery    . Patella fracture surgery Left 04/2007  . Vertebroplasty  01/2008    "compressed lumbar fracture"  . Laparoscopic lysis of adhesions  1975-1987 X 5  . Exploratory laparotomy  9562-1308 X 1    "adhesions"  . Oophorectomy Bilateral 1980's  . Vitrectomy Right 08/1997  . Bunionectomy with hammertoe reconstruction Right 02/1999  . Coronary angioplasty with stent placement  07/1999    "?1"  . Lumbar microdiscectomy  03/2005  . Orif ankle fracture Left 12/25/2014    Procedure: OPEN REDUCTION INTERNAL FIXATION (ORIF) LEFT TRIMALLEOLAR ANKLE FRACTURE;  Surgeon: Tarry Kos, MD;  Location: [facility] OR;  Service: Orthopedics;  Laterality: Left;   Family History  Problem Relation Age of Onset  . Liver  disease Sister   . Colon cancer Brother    Social History  Substance Use Topics  . Smoking status: Current Every Day Smoker -- 1.00 packs/day for 27 years    Types: Cigarettes  . Smokeless tobacco: Never Used     Comment: "quit smoking cigarettes in 1985  . Alcohol Use: No   OB History    No data available     Review of Systems  Unable to perform ROS     Allergies  Diclofenac sodium; Doxycycline calcium; Codeine; and Zocor  Home Medications   Prior to Admission medications   Medication Sig Start Date End Date Taking? Authorizing Provider  acetaminophen (TYLENOL) 325 MG tablet Take 2 tablets (650 mg total) by  mouth every 6 (six) hours as needed for mild pain (or Fever >/= 101). 12/27/14  Yes Christiane Ha, MD  ARIPiprazole (ABILIFY) 5 MG tablet Take 5 mg by mouth daily.   Yes Historical Provider, MD  aspirin 325 MG EC tablet Take 1 tablet (325 mg total) by mouth 2 (two) times daily. For 6 weeks, then daily 12/27/14  Yes Christiane Ha, MD  cholecalciferol (VITAMIN D) 1000 UNITS tablet Take 2,000 Units by mouth daily.   Yes Historical Provider, MD  docusate sodium (COLACE) 100 MG capsule Take 100 mg by mouth 2 (two) times daily.   Yes Historical Provider, MD  donepezil (ARICEPT) 10 MG tablet Take 10 mg by mouth at bedtime.   Yes Historical Provider, MD  ferrous sulfate 325 (65 FE) MG tablet Take 325 mg by mouth 2 (two) times daily.   Yes Historical Provider, MD  insulin lispro (HUMALOG) 100 UNIT/ML injection Inject 3 Units into the skin 3 (three) times daily with meals. With SSI  0-150 = 0 ; 151 - 200 = 1 unit;  201 - 250 = 2 units;  251 - 300 = 3 units ; 301 - 400 = 5 units   Yes Historical Provider, MD  lamoTRIgine (LAMICTAL) 100 MG tablet Take 50 mg by mouth daily.   Yes Historical Provider, MD  LORazepam (ATIVAN) 0.5 MG tablet Take 0.5 mg by mouth every 8 (eight) hours. Anxiety   Yes Historical Provider, MD  Multiple Vitamin (MULITIVITAMIN WITH MINERALS) TABS Take 1 tablet by mouth daily.   Yes Historical Provider, MD  omeprazole (PRILOSEC) 20 MG capsule Take 40 mg by mouth daily.   Yes Historical Provider, MD  polyethylene glycol (MIRALAX / GLYCOLAX) packet Take 17 g by mouth daily as needed for mild constipation. 12/27/14  Yes Christiane Ha, MD  prazosin (MINIPRESS) 1 MG capsule Take 1 mg by mouth daily.   Yes Historical Provider, MD  rosuvastatin (CRESTOR) 10 MG tablet Take 5 mg by mouth daily.   Yes Historical Provider, MD  sertraline (ZOLOFT) 100 MG tablet Take 150 mg by mouth daily.   Yes Historical Provider, MD  amLODipine (NORVASC) 5 MG tablet Take 1 tablet (5 mg total) by mouth  daily. Patient not taking: Reported on 02/23/2015 01/01/15   Rodolph Bong, MD  ARIPiprazole (ABILIFY) 2 MG tablet Take 2 mg by mouth at bedtime.     Historical Provider, MD  atorvastatin (LIPITOR) 20 MG tablet Take 20 mg by mouth daily.    Historical Provider, MD  insulin glargine (LANTUS) 100 UNIT/ML injection Inject 15 Units into the skin daily.    Historical Provider, MD   BP 137/48 mmHg  Pulse 94  Temp(Src) 98.2 F (36.8 C) (Rectal)  Resp 25  SpO2  100% Physical Exam  Constitutional: She appears well-developed and well-nourished. No distress.  HENT:  Head: Normocephalic.  Right Ear: External ear normal.  Left Ear: External ear normal.  Nose: Nose normal.  Mouth/Throat: Oropharynx is clear and moist.  Eyes: Pupils are equal, round, and reactive to light.  Neck: Normal range of motion. Neck supple.  Cardiovascular: Normal rate.   Pulmonary/Chest: Effort normal and breath sounds normal.  Abdominal: Soft.  Mild diffuse tenderness palpation  Musculoskeletal:  Left sided ankle immobilizer in place. Immobilizer removed and dressing taken down no signs of redness, swelling, or warmth.  Neurological: She is alert.  Patient arousable and oriented to voice. She is unable to tell me where she is  Skin: Skin is warm and dry.  Nursing note and vitals reviewed.   ED Course  Procedures (including critical care time) Labs Review Labs Reviewed  CBG MONITORING, ED - Abnormal; Notable for the following:    Glucose-Capillary 590 (*)    All other components within normal limits  I-STAT CG4 LACTIC ACID, ED - Abnormal; Notable for the following:    Lactic Acid, Venous 4.63 (*)    All other components within normal limits  I-STAT VENOUS BLOOD GAS, ED - Abnormal; Notable for the following:    pH, Ven 7.243 (*)    pCO2, Ven 29.8 (*)    pO2, Ven 48.0 (*)    Bicarbonate 12.9 (*)    Acid-base deficit 13.0 (*)    All other components within normal limits  CULTURE, BLOOD (ROUTINE X 2)   CULTURE, BLOOD (ROUTINE X 2)  URINE CULTURE  URINALYSIS, ROUTINE W REFLEX MICROSCOPIC (NOT AT [facility])  BLOOD GAS, VENOUS  COMPREHENSIVE METABOLIC PANEL  CBC WITH DIFFERENTIAL/PLATELET    Imaging Review No results found. I have personally reviewed and evaluated these images and lab results as part of my medical decision-making.   EKG Interpretation   Date/Time:  Sunday February 23 2015 13:25:30 EDT Ventricular Rate:  94 PR Interval:  164 QRS Duration: 93 QT Interval:  370 QTC Calculation: 463 R Axis:   66 Text Interpretation:  Normal sinus rhythm Premature ventricular complexes  Non-specific ST-t changes Left anterior fascicular block RESOLVED SINCE  PREVIOUS Confirmed by Eliseo Withers MD, Duwayne Heck ([id number]) on 02/23/2015 2:32:08 PM     2:47 PM Patient's lactic acid is significantly elevated. Given her altered mental status and elevated lactic acid, plan fluid bolus per sepsis protocol and will start antibiotics for sepsis protocol with source of unknown origin. MDM   Final diagnoses:  Weak   1- hyperglycemia-j patient with elevated blood sugar, anion gap and ph of 7.24, no focal deficits. Patient  2-altered mental status- no focal deficits- suspect metabolic encephalopathy due to 1 and sepsis. 3- sepsis- meets criteria with recent hospitalization and now confused with elevated lactic acid.  No definite source, blood cultures pending.   Recent surgical site appears well-healed without erythema, swelling, or focal tenderness.  Discussed with Dr. Sherene Sires and critical care will see. Patient will have repeat lactic acid. I have also discussed the patient with Dr. Silverio Lay and he will follow-up as needed.  Margarita Grizzle, MD 02/23/15 [phone]

## 2015-02-23 NOTE — ED Notes (Signed)
586 glucose critical value

## 2015-02-23 NOTE — ED Notes (Signed)
To hallway bed via EMS. Pt not feeling well this am.  BS read high at home and EMS.

## 2015-02-23 NOTE — ED Notes (Signed)
CBG CHECKED 86 RN SARA NOTIFIED

## 2015-02-23 NOTE — ED Notes (Signed)
2.7 Lactic acid critical value from lab. Dr. Lovell Sheehan paged.

## 2015-02-23 NOTE — Progress Notes (Signed)
ANTIBIOTIC CONSULT NOTE - INITIAL  Pharmacy Consult for Vancomycin and Zosyn Indication: rule out sepsis  Allergies  Allergen Reactions  . Diclofenac Sodium Swelling and Other (See Comments)    Blurred vision  . Doxycycline Calcium Swelling and Other (See Comments)    Blurred vision  . Codeine Nausea And Vomiting and Other (See Comments)    Dizziness   . Zocor [Simvastatin] Other (See Comments)    Dizziness, weakness     Patient Measurements:   Weight 53.8 kg  Vital Signs: Temp: 98.2 F (36.8 C) (09/18 1410) Temp Source: Rectal (09/18 1410) BP: 132/54 mmHg (09/18 1530) Pulse Rate: 94 (09/18 1530) Intake/Output from previous day:   Intake/Output from this shift:    Labs:  Recent Labs  02/23/15 1427 02/23/15 1428  WBC  --  20.3*  HGB  --  10.2*  PLT  --  288  CREATININE 1.71*  --    Estimated Creatinine Clearance: 23.2 mL/min (by C-G formula based on Cr of 1.71). No results for input(s): VANCOTROUGH, VANCOPEAK, VANCORANDOM, GENTTROUGH, GENTPEAK, GENTRANDOM, TOBRATROUGH, TOBRAPEAK, TOBRARND, AMIKACINPEAK, AMIKACINTROU, AMIKACIN in the last 72 hours.   Microbiology: No results found for this or any previous visit (from the past 720 hour(s)).  Medical History: Past Medical History  Diagnosis Date  . Coronary artery disease   . Depression   . Retinopathy   . Anxiety   . Heart murmur   . TIA (transient ischemic attack)   . Arthritis     "fingers" (12/23/2014)  . Fibromyalgia   . Type I diabetes mellitus dx'd 1953    "I'm on a pup" (12/23/2014)  . Chronic lower back pain   . GERD (gastroesophageal reflux disease) dx'd 1995  . Osteopenia dx'd 2007    Medications:  Anti-infectives    Start     Dose/Rate Route Frequency Ordered Stop   02/23/15 1500  piperacillin-tazobactam (ZOSYN) IVPB 3.375 g     3.375 g 100 mL/hr over 30 Minutes Intravenous  Once 02/23/15 1445 02/23/15 1532   02/23/15 1500  vancomycin (VANCOCIN) IVPB 1000 mg/200 mL premix     1,000  mg 200 mL/hr over 60 Minutes Intravenous  Once 02/23/15 1445       Assessment: 74 year old female presenting with possible sepsis and acute renal failure. She is to continue antibiotic therapy with Vancomycin and Zosyn.  Note her SCr is 1.7 requiring adjustment of her vancomycin regimen.  Goal of Therapy:  Vancomycin trough level 15-20 mcg/ml  Plan:  Zosyn 3.375gm IV q8h extended infusion Vancomycin  IV q24h - next dose on 9/19 Watch renal function closely  Follow available micro data  Estella Husk, Pharm.D., BCPS, AAHIVP Clinical Pharmacist Phone: [phone] or [phone] 02/23/2015, 3:50 PM

## 2015-02-23 NOTE — ED Notes (Signed)
Dr. Lovell Sheehan notified of patients lactic acid. No new orders

## 2015-02-23 NOTE — Consult Note (Signed)
Name: Beth Payne MRN: [id number] DOB: 06-17-1940    ADMISSION DATE:  02/23/2015 CONSULTATION DATE:  02/23/15  REFERRING MD :  Dr Rosalia Hammers, ED  CHIEF COMPLAINT:  Confusion and weakness  STUDIES:  CXR 9/18 >> normal  HISTORY OF PRESENT ILLNESS:  74 yo woman, DM, CAD, CVD was reportedly well until day of admission when she woke up weak, lethargic, confused. Per ED notes there may also be a hx of some N/V recently. She was unable to ambulate, was found to be hyperglycemic. Brought to ED where labs revealed CBG 456 with a metabolic acidosis. Lactic acid was increased also at 4.63, improving to 2.68 with IVF. On my eval she is on RA, VS stable, comfortable. CXR was clear, cultures are pending, and empiric vanco + zosyn have been given.   PAST MEDICAL HISTORY :   has a past medical history of Coronary artery disease; Depression; Retinopathy; Anxiety; Heart murmur; TIA (transient ischemic attack); Arthritis; Fibromyalgia; Type I diabetes mellitus (dx'd 1953); Chronic lower back pain; GERD (gastroesophageal reflux disease) (dx'd 1995); and Osteopenia (dx'd 2007).  has past surgical history that includes Cataract extraction w/ intraocular lens  implant, bilateral (Bilateral, 1998); Tonsillectomy; Appendectomy; Bilateral carpal tunnel release (Bilateral, 2003-2004); Back surgery; Anterior cervical decomp/discectomy fusion (05/2003); Abdominal hysterectomy (1972); Dilation and curettage of uterus; Cesarean section; Eye surgery (Bilateral, since 1977); Trigger finger release (Bilateral, 2003); Cervical disc surgery (2004); Fracture surgery; Patella fracture surgery (Left, 04/2007); Vertebroplasty (01/2008); Laparoscopic lysis of adhesions (4098-1191 X 5); Exploratory laparotomy (4782-9562 X 1); Oophorectomy (Bilateral, 1980's); Vitrectomy (Right, 08/1997); Bunionectomy with hammertoe reconstruction (Right, 02/1999); Coronary angioplasty with stent (07/1999); Lumbar microdiscectomy (03/2005); and ORIF ankle fracture  (Left, 12/25/2014). Prior to Admission medications   Medication Sig Start Date End Date Taking? Authorizing Provider  acetaminophen (TYLENOL) 325 MG tablet Take 2 tablets (650 mg total) by mouth every 6 (six) hours as needed for mild pain (or Fever >/= 101). 12/27/14  Yes Christiane Ha, MD  ARIPiprazole (ABILIFY) 5 MG tablet Take 5 mg by mouth daily.   Yes Historical Provider, MD  aspirin 325 MG EC tablet Take 1 tablet (325 mg total) by mouth 2 (two) times daily. For 6 weeks, then daily 12/27/14  Yes Christiane Ha, MD  cholecalciferol (VITAMIN D) 1000 UNITS tablet Take 2,000 Units by mouth daily.   Yes Historical Provider, MD  docusate sodium (COLACE) 100 MG capsule Take 100 mg by mouth 2 (two) times daily.   Yes Historical Provider, MD  donepezil (ARICEPT) 10 MG tablet Take 10 mg by mouth at bedtime.   Yes Historical Provider, MD  ferrous sulfate 325 (65 FE) MG tablet Take 325 mg by mouth 2 (two) times daily.   Yes Historical Provider, MD  insulin lispro (HUMALOG) 100 UNIT/ML injection Inject 3 Units into the skin 3 (three) times daily with meals. With SSI  0-150 = 0 ; 151 - 200 = 1 unit;  201 - 250 = 2 units;  251 - 300 = 3 units ; 301 - 400 = 5 units   Yes Historical Provider, MD  lamoTRIgine (LAMICTAL) 100 MG tablet Take 50 mg by mouth daily.   Yes Historical Provider, MD  LORazepam (ATIVAN) 0.5 MG tablet Take 0.5 mg by mouth every 8 (eight) hours. Anxiety   Yes Historical Provider, MD  Multiple Vitamin (MULITIVITAMIN WITH MINERALS) TABS Take 1 tablet by mouth daily.   Yes Historical Provider, MD  omeprazole (PRILOSEC) 20 MG capsule Take 40 mg by mouth  daily.   Yes Historical Provider, MD  polyethylene glycol (MIRALAX / GLYCOLAX) packet Take 17 g by mouth daily as needed for mild constipation. 12/27/14  Yes Christiane Ha, MD  prazosin (MINIPRESS) 1 MG capsule Take 1 mg by mouth daily.   Yes Historical Provider, MD  rosuvastatin (CRESTOR) 10 MG tablet Take 5 mg by mouth daily.   Yes  Historical Provider, MD  sertraline (ZOLOFT) 100 MG tablet Take 150 mg by mouth daily.   Yes Historical Provider, MD  amLODipine (NORVASC) 5 MG tablet Take 1 tablet (5 mg total) by mouth daily. Patient not taking: Reported on 02/23/2015 01/01/15   Rodolph Bong, MD  ARIPiprazole (ABILIFY) 2 MG tablet Take 2 mg by mouth at bedtime.     Historical Provider, MD  atorvastatin (LIPITOR) 20 MG tablet Take 20 mg by mouth daily.    Historical Provider, MD  insulin glargine (LANTUS) 100 UNIT/ML injection Inject 15 Units into the skin daily.    Historical Provider, MD   Allergies  Allergen Reactions  . Diclofenac Sodium Swelling and Other (See Comments)    Blurred vision  . Doxycycline Calcium Swelling and Other (See Comments)    Blurred vision  . Codeine Nausea And Vomiting and Other (See Comments)    Dizziness   . Zocor [Simvastatin] Other (See Comments)    Dizziness, weakness     FAMILY HISTORY:  family history includes Colon cancer in her brother; Liver disease in her sister. SOCIAL HISTORY:  reports that she has been smoking Cigarettes.  She has a 27 pack-year smoking history. She has never used smokeless tobacco. She reports that she does not drink alcohol or use illicit drugs.  SUBJECTIVE:  She says she feels better, but is unable to clearly tell me what was bothering her initially, clearly confused  VITAL SIGNS: Temp:  [98.2 F (36.8 C)] 98.2 F (36.8 C) (09/18 1410) Pulse Rate:  [89-98] 89 (09/18 1800) Resp:  [17-28] 21 (09/18 1800) BP: (108-147)/(48-71) 144/59 mmHg (09/18 1800) SpO2:  [97 %-100 %] 100 % (09/18 1800)  PHYSICAL EXAMINATION: General:  Elderly woman, NAD on RA Neuro:  Awake and alert, oriented to self, not situation HEENT:  OP dry, no nasal congestion, no stridor Cardiovascular:  RRR no M Lungs:  Clear B  Abdomen:  Soft, NT, + BS Musculoskeletal:  No edema Skin:  No rash   Recent Labs Lab 02/23/15 1427  NA 137  K 3.8  CL 105  CO2 14*  BUN 36*    CREATININE 1.71*  GLUCOSE 586*    Recent Labs Lab 02/23/15 1428  HGB 10.2*  HCT 31.6*  WBC 20.3*  PLT 288   Dg Chest Port 1 View  02/23/2015   CLINICAL DATA:  Weakness. History of coronary artery disease, TIA and diabetes.  EXAM: PORTABLE CHEST - 1 VIEW  COMPARISON:  12/31/2014  FINDINGS: Cardiac silhouette is normal in size and configuration. No mediastinal or hilar masses or evidence of adenopathy. Clear lungs. No pleural effusion or pneumothorax.  Bony thorax is grossly intact.  IMPRESSION: No acute cardiopulmonary disease.   Electronically Signed   By: Amie Portland M.D.   On: 02/23/2015 14:42    ASSESSMENT / PLAN:  74 yo woman with DKA, suspect due to sepsis. She is being successfully volume resuscitated with clearing lactate. She has received empiric abx. No source of sepsis has been identified yet. I do not believe she needs ICU admission at this juncture. Would admit her to SDU,  treat w insulin gtt and IVF's. Continue eval for source of possible sepsis. If none revealed then would de-escalate abx and presume leukocytosis is due to stress rxn.    Levy Pupa, MD, PhD 02/23/2015, 7:11 PM  Pulmonary and Critical Care [phone] or if no answer [phone]

## 2015-02-23 NOTE — ED Provider Notes (Signed)
  Physical Exam  BP 152/64 mmHg  Pulse 96  Temp(Src) 98.2 F (36.8 C) (Rectal)  Resp 30  SpO2 100%  Physical Exam  ED Course  Procedures  MDM Dr. Delton Coombes from critical care saw patient. Patient's CBG decreased to 237, started d 5 drip. BMP pending. Lactate improved to 2.68. Given IV abx. ICU felt that patient doesn't need critical care. Will admit to stepdown.   Richardean Canal, MD 02/23/15 2012

## 2015-02-24 LAB — CBG MONITORING, ED
GLUCOSE-CAPILLARY: 104 mg/dL — AB (ref 65–99)
GLUCOSE-CAPILLARY: 168 mg/dL — AB (ref 65–99)
GLUCOSE-CAPILLARY: 229 mg/dL — AB (ref 65–99)
Glucose-Capillary: 116 mg/dL — ABNORMAL HIGH (ref 65–99)
Glucose-Capillary: 146 mg/dL — ABNORMAL HIGH (ref 65–99)
Glucose-Capillary: 250 mg/dL — ABNORMAL HIGH (ref 65–99)
Glucose-Capillary: 272 mg/dL — ABNORMAL HIGH (ref 65–99)
Glucose-Capillary: 238 mg/dL — ABNORMAL HIGH (ref 65–99)

## 2015-02-24 LAB — BASIC METABOLIC PANEL
Anion gap: 14 (ref 5–15)
Anion gap: 15 (ref 5–15)
BUN: 15 mg/dL (ref 6–20)
BUN: 10 mg/dL (ref 6–20)
CHLORIDE: 103 mmol/L (ref 101–111)
CHLORIDE: 105 mmol/L (ref 101–111)
CO2: 17 mmol/L — ABNORMAL LOW (ref 22–32)
CO2: 19 mmol/L — AB (ref 22–32)
Calcium: 8.7 mg/dL — ABNORMAL LOW (ref 8.9–10.3)
Calcium: 9.3 mg/dL (ref 8.9–10.3)
Creatinine, Ser: 0.93 mg/dL (ref 0.44–1.00)
Creatinine, Ser: 0.86 mg/dL (ref 0.44–1.00)
GFR calc Af Amer: >60 mL/min (ref >60)
GFR calc non Af Amer: >60 mL/min (ref >60)
GFR, EST NON AFRICAN AMERICAN: 60 mL/min — AB (ref >60)
Glucose, Bld: 278 mg/dL — ABNORMAL HIGH (ref 65–99)
Glucose, Bld: 211 mg/dL — ABNORMAL HIGH (ref 65–99)
POTASSIUM: 3.1 mmol/L — AB (ref 3.5–5.1)
Potassium: 3 mmol/L — ABNORMAL LOW (ref 3.5–5.1)
SODIUM: 134 mmol/L — AB (ref 135–145)
Sodium: 139 mmol/L (ref 135–145)

## 2015-02-24 LAB — INFLUENZA PANEL BY PCR (TYPE A & B)
H1N1 flu by pcr: NOT DETECTED
Influenza A By PCR: NEGATIVE
Influenza B By PCR: NEGATIVE

## 2015-02-24 LAB — LACTIC ACID, PLASMA
Lactic Acid, Venous: 1.1 mmol/L (ref 0.5–2.0)
Lactic Acid, Venous: 2.2 mmol/L (ref 0.5–2.0)

## 2015-02-24 LAB — CBC
HEMATOCRIT: 26 % — AB (ref 36.0–46.0)
Hemoglobin: 8.7 g/dL — ABNORMAL LOW (ref 12.0–15.0)
MCH: 27.7 pg (ref 26.0–34.0)
MCHC: 33.5 g/dL (ref 30.0–36.0)
MCV: 82.8 fL (ref 78.0–100.0)
PLATELETS: 222 10*3/uL (ref 150–400)
RBC: 3.14 MIL/uL — AB (ref 3.87–5.11)
RDW: 15.6 % — ABNORMAL HIGH (ref 11.5–15.5)
WBC: 17.7 10*3/uL — AB (ref 4.0–10.5)

## 2015-02-24 LAB — URINE CULTURE

## 2015-02-24 LAB — GLUCOSE, CAPILLARY
GLUCOSE-CAPILLARY: 149 mg/dL — AB (ref 65–99)
GLUCOSE-CAPILLARY: 165 mg/dL — AB (ref 65–99)
GLUCOSE-CAPILLARY: 202 mg/dL — AB (ref 65–99)
Glucose-Capillary: 194 mg/dL — ABNORMAL HIGH (ref 65–99)
Glucose-Capillary: 179 mg/dL — ABNORMAL HIGH (ref 65–99)
Glucose-Capillary: 121 mg/dL — ABNORMAL HIGH (ref 65–99)

## 2015-02-24 LAB — MRSA PCR SCREENING: MRSA by PCR: NEGATIVE

## 2015-02-24 LAB — MAGNESIUM: Magnesium: 1.8 mg/dL (ref 1.7–2.4)

## 2015-02-24 MED ORDER — INSULIN GLARGINE 100 UNIT/ML ~~LOC~~ SOLN
10.0000 [IU] | Freq: Once | SUBCUTANEOUS | Status: AC
  Administered 2015-02-24: 10 [IU] via SUBCUTANEOUS
  Filled 2015-02-24: qty 0.1

## 2015-02-24 MED ORDER — SODIUM CHLORIDE 0.9 % IV SOLN
INTRAVENOUS | Status: DC
  Administered 2015-02-24: 18:00:00 via INTRAVENOUS

## 2015-02-24 MED ORDER — POTASSIUM CHLORIDE CRYS ER 20 MEQ PO TBCR: 40.0000 meq | EXTENDED_RELEASE_TABLET | Freq: Once | ORAL | Status: DC

## 2015-02-24 MED ORDER — SODIUM CHLORIDE 0.9 % IV SOLN
INTRAVENOUS | Status: DC
  Administered 2015-02-24: 1.4 [IU]/h via INTRAVENOUS
  Filled 2015-02-24: qty 2.5

## 2015-02-24 MED ORDER — SODIUM CHLORIDE 0.9 % IV SOLN: Freq: Once | INTRAVENOUS | Status: DC

## 2015-02-24 MED ORDER — INSULIN ASPART 100 UNIT/ML ~~LOC~~ SOLN
0.0000 [IU] | SUBCUTANEOUS | Status: DC
  Administered 2015-02-24: 3 [IU] via SUBCUTANEOUS
  Administered 2015-02-24: 1 [IU] via SUBCUTANEOUS
  Filled 2015-02-24 (×2): qty 1

## 2015-02-24 MED ORDER — ALUM & MAG HYDROXIDE-SIMETH 200-200-20 MG/5ML PO SUSP: 30.0000 mL | Freq: Four times a day (QID) | ORAL | Status: DC | PRN

## 2015-02-24 MED ORDER — AMLODIPINE BESYLATE 5 MG PO TABS
5.0000 mg | ORAL_TABLET | Freq: Every day | ORAL | Status: DC
  Administered 2015-02-24 – 2015-02-27 (×4): 5 mg via ORAL
  Filled 2015-02-24 (×4): qty 1

## 2015-02-24 MED ORDER — ENOXAPARIN SODIUM 40 MG/0.4ML ~~LOC~~ SOLN
40.0000 mg | SUBCUTANEOUS | Status: DC
  Administered 2015-02-25: 40 mg via SUBCUTANEOUS
  Filled 2015-02-24 (×2): qty 0.4

## 2015-02-24 MED ORDER — DEXTROSE 50 % IV SOLN: 25.0000 mL | INTRAVENOUS | Status: DC | PRN

## 2015-02-24 MED ORDER — POTASSIUM CHLORIDE 20 MEQ/15ML (10%) PO SOLN
40.0000 meq | Freq: Once | ORAL | Status: AC
  Administered 2015-02-24: 40 meq via ORAL
  Filled 2015-02-24: qty 30

## 2015-02-24 MED ORDER — ENOXAPARIN SODIUM 30 MG/0.3ML ~~LOC~~ SOLN
30.0000 mg | SUBCUTANEOUS | Status: DC
  Administered 2015-02-24: 30 mg via SUBCUTANEOUS
  Filled 2015-02-24: qty 0.3

## 2015-02-24 MED ORDER — HYDRALAZINE HCL 20 MG/ML IJ SOLN
5.0000 mg | Freq: Four times a day (QID) | INTRAMUSCULAR | Status: DC | PRN
  Administered 2015-02-24 – 2015-02-26 (×4): 5 mg via INTRAVENOUS
  Filled 2015-02-24 (×4): qty 1

## 2015-02-24 MED ORDER — HYDROMORPHONE HCL 1 MG/ML IJ SOLN: 0.5000 mg | INTRAMUSCULAR | Status: DC | PRN

## 2015-02-24 MED ORDER — ONDANSETRON HCL 4 MG PO TABS: 4.0000 mg | ORAL_TABLET | Freq: Four times a day (QID) | ORAL | Status: DC | PRN

## 2015-02-24 MED ORDER — INSULIN GLARGINE 100 UNIT/ML ~~LOC~~ SOLN: 10.0000 [IU] | Freq: Every day | SUBCUTANEOUS | Status: DC

## 2015-02-24 MED ORDER — INSULIN REGULAR BOLUS VIA INFUSION
0.0000 [IU] | Freq: Three times a day (TID) | INTRAVENOUS | Status: DC
  Filled 2015-02-24: qty 10

## 2015-02-24 MED ORDER — ACETAMINOPHEN 325 MG PO TABS
650.0000 mg | ORAL_TABLET | Freq: Four times a day (QID) | ORAL | Status: DC | PRN
Start: 2015-02-24 — End: 2015-02-27
  Administered 2015-02-24: 650 mg via ORAL
  Filled 2015-02-24: qty 2

## 2015-02-24 MED ORDER — POTASSIUM CHLORIDE IN NACL 20-0.9 MEQ/L-% IV SOLN
INTRAVENOUS | Status: DC
  Administered 2015-02-24: 15:00:00 via INTRAVENOUS
  Administered 2015-02-25: 75 mL via INTRAVENOUS
  Administered 2015-02-25: 1000 mL via INTRAVENOUS
  Filled 2015-02-24 (×4): qty 1000

## 2015-02-24 MED ORDER — ONDANSETRON HCL 4 MG/2ML IJ SOLN
4.0000 mg | Freq: Four times a day (QID) | INTRAMUSCULAR | Status: DC | PRN
  Administered 2015-02-25: 4 mg via INTRAVENOUS
  Filled 2015-02-24: qty 2

## 2015-02-24 MED ORDER — ACETAMINOPHEN 650 MG RE SUPP
650.0000 mg | Freq: Four times a day (QID) | RECTAL | Status: DC | PRN
  Filled 2015-02-24: qty 1

## 2015-02-24 MED ORDER — DEXTROSE-NACL 5-0.45 % IV SOLN
INTRAVENOUS | Status: DC
Start: 2015-02-24 — End: 2015-02-25
  Administered 2015-02-24: 17:00:00 via INTRAVENOUS
  Administered 2015-02-24: 1000 mL via INTRAVENOUS

## 2015-02-24 MED ORDER — DONEPEZIL HCL 10 MG PO TABS
10.0000 mg | ORAL_TABLET | Freq: Every day | ORAL | Status: DC
  Administered 2015-02-24 – 2015-02-26 (×3): 10 mg via ORAL
  Filled 2015-02-24 (×4): qty 1

## 2015-02-24 MED ORDER — SODIUM CHLORIDE 0.9 % IV SOLN
INTRAVENOUS | Status: DC
  Administered 2015-02-24: 02:00:00 via INTRAVENOUS

## 2015-02-24 MED ORDER — SODIUM CHLORIDE 0.9 % IV SOLN: INTRAVENOUS | Status: DC

## 2015-02-24 NOTE — ED Notes (Signed)
POCT CBG resulted 238; Dreama, RN aware

## 2015-02-24 NOTE — Progress Notes (Addendum)
Patient Demographics  Beth Payne, is a 74 y.o. female, DOB - 12-25-1940, ZOX:[id number]  Admit date - 02/23/2015   Admitting Physician [name] Parker, MD  Outpatient Primary MD for the patient is Beth Hy, MD  LOS - 1   Chief Complaint  Patient presents with  . Hyperglycemia         Subjective:   Beth Payne today is confused, can't give any reliable complaints.  Assessment & Plan    Principal Problem:   SIRS (systemic inflammatory response syndrome) Active Problems:   DKA (diabetic ketoacidoses)   CAD s/p stent to mid-RCA in 2001.   Normocytic anemia   DM (diabetes mellitus), type 1 with complications   Fracture, ankle   Acute encephalopathy   AKI (acute kidney injury)  Uncontrolled diabetes mellitus, DKA. - Patient with elevated anion gap on admission, required insulin drip initially, transitioned to subcutaneous Lantus and sliding scale after anion gap has closed. - Started on Lantus 10 units subcutaneous daily, and insulin sliding scale, she still appears lethargic, anion gap is steadily increasing, will recheck another BMP, a likely should be resumed back on insulin drip pending her anion gap. - Follow glycohemoglobin A1c  SIRS - Patient presents with fever, leukocytosis, elevated lactic acid and tachypnea - Negative urinalysis, no acute finding and chest x-ray, negative meningeal signs, blood cultures pending. - Possibly stress-related due to DKA - Continue with broad-spectrum IV antibiotics pending blood cultures results.  Hypertension - Uncontrolled, as you him home medication, added as needed hydralazine  Acute encephalopathy - Most likely double lytic related to her DKA and SIRS, continue to monitor clinically, if no improvement may get CT head.  Acute renal failure - Resolved, back to baseline - Continue with IV fluids  History of coronary artery  disease - Continue with aspirin and statin  Hyperlipidemia - Continue with statin  Dementia - Continue with Aricept  Hypokalemia - Repleted, recheck in a.m.  Code Status: Full  Family Communication: None at bedside  Disposition Plan: Pending further workup   Procedures  None   Consults   None   Medications  Scheduled Meds: . cholecalciferol  2,000 Units Oral Daily  . enoxaparin (LOVENOX) injection  30 mg Subcutaneous Q24H  . insulin aspart  0-9 Units Subcutaneous 6 times per day  . [START ON 02/25/2015] insulin glargine  10 Units Subcutaneous Daily  . lamoTRIgine  50 mg Oral Daily  . LORazepam  0.5 mg Oral 3 times per day  . pantoprazole  40 mg Oral Daily  . piperacillin-tazobactam (ZOSYN)  IV  3.375 g Intravenous 3 times per day  . potassium chloride  40 mEq Oral Once  . prazosin  1 mg Oral Daily  . rosuvastatin  5 mg Oral Daily  . sertraline  150 mg Oral Daily  . vancomycin  500 mg Intravenous Q24H   Continuous Infusions: . sodium chloride 75 mL/hr at 02/24/15 0224  . sodium chloride     PRN Meds:.acetaminophen **OR** acetaminophen, alum & mag hydroxide-simeth, hydrALAZINE, HYDROmorphone (DILAUDID) injection, ondansetron **OR** ondansetron (ZOFRAN) IV  DVT Prophylaxis  Lovenox   Lab Results  Component Value Date   PLT 222 02/24/2015    Antibiotics    Anti-infectives    Start  Dose/Rate Route Frequency Ordered Stop   02/24/15 1400  vancomycin (VANCOCIN) 500 mg in sodium chloride 0.9 % 100 mL IVPB     500 mg 100 mL/hr over 60 Minutes Intravenous Every 24 hours 02/23/15 1551     02/23/15 2200  piperacillin-tazobactam (ZOSYN) IVPB 3.375 g     3.375 g 12.5 mL/hr over 240 Minutes Intravenous 3 times per day 02/23/15 1551     02/23/15 1500  piperacillin-tazobactam (ZOSYN) IVPB 3.375 g     3.375 g 100 mL/hr over 30 Minutes Intravenous  Once 02/23/15 1445 02/23/15 1611   02/23/15 1500  vancomycin (VANCOCIN) IVPB 1000 mg/200 mL premix     1,000 mg 200  mL/hr over 60 Minutes Intravenous  Once 02/23/15 1445 02/23/15 1611          Objective:   Filed Vitals:   02/24/15 1145 02/24/15 1200 02/24/15 1215 02/24/15 1300  BP: 180/83 168/65 180/99 190/72  Pulse: 87 98 86 90  Temp:      TempSrc:      Resp: 34 Height:      Weight:      SpO2: 100% 99% 100% 100%    Wt Readings from Last 3 Encounters:  02/24/15 48.5 kg (106 lb 14.8 oz)  02/20/15 53.797 kg (118 lb 9.6 oz)  01/01/15 52.4 kg (115 lb 8.3 oz)     Intake/Output Summary (Last 24 hours) at 02/24/15 1415 Last data filed at 02/24/15 1200  Gross per 24 hour  Intake   2225 ml  Output      0 ml  Net   2225 ml     Physical Exam  lethargic, confused Supple Neck,No JVD,  Symmetrical Chest wall movement, Good air movement bilaterally,  RRR,No Gallops,Rubs or new Murmurs, No Parasternal Heave +ve B.Sounds, Abd Soft, No tenderness, No organomegaly appriciated,  No Cyanosis, Clubbing or edema, No new Rash or bruise     Data Review   Micro Results Recent Results (from the past 240 hour(s))  Blood Culture (routine x 2)     Status: None (Preliminary result)   Collection Time: 02/23/15  2:14 PM  Result Value Ref Range Status   Specimen Description BLOOD RIGHT ANTECUBITAL  Final   Special Requests BOTTLES DRAWN AEROBIC AND ANAEROBIC 5CC  Final   Culture PENDING  Incomplete   Report Status PENDING  Incomplete  Urine culture     Status: None (Preliminary result)   Collection Time: 02/23/15  2:33 PM  Result Value Ref Range Status   Specimen Description URINE, CLEAN CATCH  Final   Special Requests NONE  Final   Culture CULTURE REINCUBATED FOR BETTER GROWTH  Final   Report Status PENDING  Incomplete  MRSA PCR Screening     Status: None   Collection Time: 02/24/15 10:43 AM  Result Value Ref Range Status   MRSA by PCR NEGATIVE NEGATIVE Final    Comment:        The GeneXpert MRSA Assay (FDA approved for NASAL specimens only), is one component of a comprehensive  MRSA colonization surveillance program. It is not intended to diagnose MRSA infection nor to guide or monitor treatment for MRSA infections.     Radiology Reports Dg Chest Port 1 View  02/23/2015   CLINICAL DATA:  Weakness. History of coronary artery disease, TIA and diabetes.  EXAM: PORTABLE CHEST - 1 VIEW  COMPARISON:  12/31/2014  FINDINGS: Cardiac silhouette is normal in size and configuration. No mediastinal or hilar masses or  evidence of adenopathy. Clear lungs. No pleural effusion or pneumothorax.  Bony thorax is grossly intact.  IMPRESSION: No acute cardiopulmonary disease.   Electronically Signed   By: Amie Portland M.D.   On: 02/23/2015 14:42     CBC  Recent Labs Lab 02/23/15 1428 02/24/15 0527  WBC 20.3* 17.7*  HGB 10.2* 8.7*  HCT 31.6* 26.0*  PLT 288 222  MCV 85.2 82.8  MCH 27.5 27.7  MCHC 32.3 33.5  RDW 15.8* 15.6*  LYMPHSABS 1.5  --   MONOABS 2.2*  --   EOSABS 0.0  --   BASOSABS 0.0  --     Chemistries   Recent Labs Lab 02/23/15 1427 02/23/15 1902 02/23/15 2205 02/24/15 0527  NA 137 138 140 134*  K 3.8 3.9 3.2* 3.1*  CL 105 108 110 103  CO2 14* 20* 18* 17*  GLUCOSE 586* 281* 142* 278*  BUN 36* 23* 20 15  CREATININE 1.71* 1.08* 0.94 0.93  CALCIUM 9.4 8.7* 8.8* 8.7*  AST 31  --   --   --   ALT 24  --   --   --   ALKPHOS 87  --   --   --   BILITOT 0.9  --   --   --    ------------------------------------------------------------------------------------------------------------------ estimated creatinine clearance is 38.7 mL/min (by C-G formula based on Cr of 0.93). ------------------------------------------------------------------------------------------------------------------ No results for input(s): HGBA1C in the last 72 hours. ------------------------------------------------------------------------------------------------------------------ No results for input(s): CHOL, HDL, LDLCALC, TRIG, CHOLHDL, LDLDIRECT in the last 72  hours. ------------------------------------------------------------------------------------------------------------------ No results for input(s): TSH, T4TOTAL, T3FREE, THYROIDAB in the last 72 hours.  Invalid input(s): FREET3 ------------------------------------------------------------------------------------------------------------------ No results for input(s): VITAMINB12, FOLATE, FERRITIN, TIBC, IRON, RETICCTPCT in the last 72 hours.  Coagulation profile  Recent Labs Lab 02/23/15 2205  INR 1.15    No results for input(s): DDIMER in the last 72 hours.  Cardiac Enzymes No results for input(s): CKMB, TROPONINI, MYOGLOBIN in the last 168 hours.  Invalid input(s): CK ------------------------------------------------------------------------------------------------------------------ Invalid input(s): POCBNP     Time Spent in minutes   40 minutes   Dermot Gremillion M.D on 02/24/2015 at 2:15 PM  Between 7am to 7pm - Pager - [phone]  After 7pm go to [email or web address] - password [facility]  [facility]   Office  [phone]

## 2015-02-24 NOTE — ED Notes (Signed)
Patient given a warm blanket. 

## 2015-02-24 NOTE — ED Notes (Signed)
Blood transfusion orders placed in error; orders to be discontinued by Dr.Jenkins

## 2015-02-24 NOTE — ED Notes (Signed)
Attempting to feed patient and patient only took a small bite of eggs and a sip of orange juice and then would not eat anything else

## 2015-02-24 NOTE — Care Management Note (Signed)
Case Management Note  Patient Details  Name: Beth Payne MRN: [id number] Date of Birth: [birth year]  Subjective/Objective:       Adm w sirs             Action/Plan: lives alone, pcp dr [name]   Expected Discharge Date:                  Expected Discharge Plan:     In-House Referral:     Discharge planning Services     Post Acute Care Choice:    Choice offered to:     DME Arranged:    DME Agency:     HH Arranged:    HH Agency:     Status of Service:     Medicare Important Message Given:    Date Medicare IM Given:    Medicare IM give by:    Date Additional Medicare IM Given:    Additional Medicare Important Message give by:     If discussed at Long Length of Stay Meetings, dates discussed:    Additional Comments: ur review done  Hanley Hays, RN 02/24/2015, 11:35 AM

## 2015-02-24 NOTE — Progress Notes (Signed)
Result of lactic acid-2.2 and bp-180 systolic, relayed to MD with order.

## 2015-02-24 NOTE — ED Notes (Signed)
POCT CBG resulted 168; Dreama, RN aware

## 2015-02-24 NOTE — Progress Notes (Signed)
Inpatient Diabetes Program Recommendations  AACE/ADA: New Consensus Statement on Inpatient Glycemic Control (2015)  Target Ranges:  Prepandial:   less than 140 mg/dL      Peak postprandial:   less than 180 mg/dL (1-2 hours)      Critically ill patients:  140 - 180 mg/dL   Review of Glycemic Control  Diabetes history: DM1 Outpatient Diabetes medications: Lantus 15 units daily, Humalog 3 units TID with meals plus sliding scale Current orders for Inpatient glycemic control: Novolog 0-9 units Q4H  Inpatient Diabetes Program Recommendations: Insulin - Basal: Initial glucose was 590 mg/dl and patient was started on an insulin drip. Patient was transitioned to SQ insulin and received Lantus 10 units on 9/19 at 4:33 am. Please order Lantus 10 units QAM (to start on 02/25/15).  Thanks, Orlando Penner, RN, MSN, CCRN, CDE Diabetes Coordinator Inpatient Diabetes Program [phone] (Team Pager from 8am to 5pm) [phone] ([facility] office) [phone] [facility] office) [phone] [facility] office)

## 2015-02-24 NOTE — Progress Notes (Signed)
Latest bp-77/43, md aware,NS 500 bolus given.

## 2015-02-24 NOTE — ED Notes (Signed)
Pt found to be attempting to climb out of bed; assisted back to bed; safety/seizure pads placed on side rails

## 2015-02-24 NOTE — ED Notes (Signed)
Myself and Dreama, RN changed stretcher linens and patient's gown for patient had urinated; placed clean dry linens on stretcher and gown on patient

## 2015-02-24 NOTE — Progress Notes (Signed)
Admission history  Obtained from pt's friend [name]  who knows her for 60 years.

## 2015-02-25 LAB — GLUCOSE, CAPILLARY
GLUCOSE-CAPILLARY: 152 mg/dL — AB (ref 65–99)
GLUCOSE-CAPILLARY: 139 mg/dL — AB (ref 65–99)
GLUCOSE-CAPILLARY: 159 mg/dL — AB (ref 65–99)
GLUCOSE-CAPILLARY: 182 mg/dL — AB (ref 65–99)
Glucose-Capillary: 142 mg/dL — ABNORMAL HIGH (ref 65–99)
Glucose-Capillary: 143 mg/dL — ABNORMAL HIGH (ref 65–99)
Glucose-Capillary: 305 mg/dL — ABNORMAL HIGH (ref 65–99)

## 2015-02-25 LAB — CBC
HCT: 27.2 % — ABNORMAL LOW (ref 36.0–46.0)
HEMOGLOBIN: 9.1 g/dL — AB (ref 12.0–15.0)
MCH: 27.5 pg (ref 26.0–34.0)
MCHC: 33.5 g/dL (ref 30.0–36.0)
MCV: 82.2 fL (ref 78.0–100.0)
Platelets: 220 10*3/uL (ref 150–400)
RBC: 3.31 MIL/uL — AB (ref 3.87–5.11)
RDW: 15.5 % (ref 11.5–15.5)
WBC: 12 10*3/uL — AB (ref 4.0–10.5)

## 2015-02-25 LAB — BASIC METABOLIC PANEL
ANION GAP: 11 (ref 5–15)
Anion gap: 8 (ref 5–15)
BUN: 8 mg/dL (ref 6–20)
BUN: 8 mg/dL (ref 6–20)
CALCIUM: 8.7 mg/dL — AB (ref 8.9–10.3)
CALCIUM: 8.8 mg/dL — AB (ref 8.9–10.3)
CHLORIDE: 107 mmol/L (ref 101–111)
CHLORIDE: 110 mmol/L (ref 101–111)
CO2: 21 mmol/L — ABNORMAL LOW (ref 22–32)
CO2: 19 mmol/L — AB (ref 22–32)
CREATININE: 0.72 mg/dL (ref 0.44–1.00)
Creatinine, Ser: 0.78 mg/dL (ref 0.44–1.00)
GFR calc non Af Amer: >60 mL/min (ref >60)
GLUCOSE: 173 mg/dL — AB (ref 65–99)
Glucose, Bld: 157 mg/dL — ABNORMAL HIGH (ref 65–99)
POTASSIUM: 3 mmol/L — AB (ref 3.5–5.1)
Potassium: 3 mmol/L — ABNORMAL LOW (ref 3.5–5.1)
SODIUM: 139 mmol/L (ref 135–145)
Sodium: 137 mmol/L (ref 135–145)

## 2015-02-25 LAB — HEMOGLOBIN A1C
Hgb A1c MFr Bld: 8.1 % — ABNORMAL HIGH (ref 4.8–5.6)
MEAN PLASMA GLUCOSE: 186 mg/dL

## 2015-02-25 LAB — MAGNESIUM: MAGNESIUM: 1.8 mg/dL (ref 1.7–2.4)

## 2015-02-25 MED ORDER — POTASSIUM CHLORIDE CRYS ER 20 MEQ PO TBCR
40.0000 meq | EXTENDED_RELEASE_TABLET | Freq: Once | ORAL | Status: AC
  Administered 2015-02-25: 40 meq via ORAL
  Filled 2015-02-25: qty 2

## 2015-02-25 MED ORDER — POTASSIUM CHLORIDE 20 MEQ/15ML (10%) PO SOLN
40.0000 meq | ORAL | Status: AC
  Administered 2015-02-25 (×2): 40 meq via ORAL
  Filled 2015-02-25 (×2): qty 30

## 2015-02-25 MED ORDER — INSULIN GLARGINE 100 UNIT/ML ~~LOC~~ SOLN
15.0000 [IU] | Freq: Every day | SUBCUTANEOUS | Status: DC
  Administered 2015-02-25 (×2): 15 [IU] via SUBCUTANEOUS
  Filled 2015-02-25 (×3): qty 0.15

## 2015-02-25 MED ORDER — POTASSIUM CHLORIDE CRYS ER 20 MEQ PO TBCR
40.0000 meq | EXTENDED_RELEASE_TABLET | ORAL | Status: DC
  Administered 2015-02-25: 40 meq via ORAL
  Filled 2015-02-25: qty 2

## 2015-02-25 MED ORDER — INSULIN ASPART 100 UNIT/ML ~~LOC~~ SOLN
0.0000 [IU] | Freq: Three times a day (TID) | SUBCUTANEOUS | Status: DC
  Administered 2015-02-25: 1 [IU] via SUBCUTANEOUS
  Administered 2015-02-25: 2 [IU] via SUBCUTANEOUS
  Administered 2015-02-25: 7 [IU] via SUBCUTANEOUS
  Administered 2015-02-26: 3 [IU] via SUBCUTANEOUS
  Administered 2015-02-27: 2 [IU] via SUBCUTANEOUS

## 2015-02-25 NOTE — Evaluation (Signed)
Clinical/Bedside Swallow Evaluation Patient Details  Name: Beth Payne MRN: [id number] Date of Birth: Dec 04, 1940  Today's Date: 02/25/2015 Time: SLP Start Time (ACUTE ONLY): 1324 SLP Stop Time (ACUTE ONLY): 0851 SLP Time Calculation (min) (ACUTE ONLY): 13 min  Past Medical History:  Past Medical History  Diagnosis Date  . Coronary artery disease   . Depression   . Retinopathy   . Anxiety   . Heart murmur   . TIA (transient ischemic attack)   . Arthritis     "fingers" (12/23/2014)  . Fibromyalgia   . Type I diabetes mellitus dx'd 1953    "I'm on a pup" (12/23/2014)  . Chronic lower back pain   . GERD (gastroesophageal reflux disease) dx'd 1995  . Osteopenia dx'd 2007   Past Surgical History:  Past Surgical History  Procedure Laterality Date  . Cataract extraction w/ intraocular lens  implant, bilateral Bilateral 1998  . Tonsillectomy    . Appendectomy    . Bilateral carpal tunnel release Bilateral 2003-2004  . Back surgery    . Anterior cervical decomp/discectomy fusion  05/2003    "C5-6"  . Abdominal hysterectomy  1972  . Dilation and curettage of uterus    . Cesarean section    . Eye surgery Bilateral since 1977    "numerous slaser surgeries for retinopathy"  . Trigger finger release Bilateral 2003  . Cervical disc surgery  2004    "collapsed C-5"  . Fracture surgery    . Patella fracture surgery Left 04/2007  . Vertebroplasty  01/2008    "compressed lumbar fracture"  . Laparoscopic lysis of adhesions  1975-1987 X 5  . Exploratory laparotomy  4010-2725 X 1    "adhesions"  . Oophorectomy Bilateral 1980's  . Vitrectomy Right 08/1997  . Bunionectomy with hammertoe reconstruction Right 02/1999  . Coronary angioplasty with stent placement  07/1999    "?1"  . Lumbar microdiscectomy  03/2005  . Orif ankle fracture Left 12/25/2014    Procedure: OPEN REDUCTION INTERNAL FIXATION (ORIF) LEFT TRIMALLEOLAR ANKLE FRACTURE;  Surgeon: Tarry Kos, MD;  Location: [facility] OR;   Service: Orthopedics;  Laterality: Left;   HPI:  74 y.o. female with a history of DM, CAD and recent ORIF Left Ankle, TIA, fibromyalgia, GERD, chronic lower back pain, ACDF 2004 admitted with complaints of lethargy and increasing confusion along with fevers and chills and increased blood sugars.Per MD notes, pt found to have a glucose level of 590 and was found to have DKA.CXR No acute cardiopulmonary disease.    Assessment / Plan / Recommendation Clinical Impression  Pt anxious, decreased cooperation but would follow commands with repetition and encouragement. Mild difficulty coordinating respiraiton and swalllow resulting in increased work of breathing post swallow (pt did appear to be anxious). Required max encouragement to consume po's. No overt s/s aspiration although increased risk. Recommend downgrade diet texture to Dys 2, continue thin, crush pills and brief follow up for appropriateness.    Aspiration Risk  Moderate    Diet Recommendation Dysphagia 2 (Fine chop);Thin   Medication Administration: Crushed with puree Compensations: Small sips/bites;Slow rate    Other  Recommendations Oral Care Recommendations: Oral care BID   Follow Up Recommendations       Frequency and Duration min 1 x/week  1 week   Pertinent Vitals/Pain none    SLP Swallow Goals     Swallow Study Prior Functional Status       General Other Pertinent Information: 74 y.o. female with a history  of DM, CAD and recent ORIF Left Ankle, TIA, fibromyalgia, GERD, chronic lower back pain, ACDF 2004 admitted with complaints of lethargy and increasing confusion along with fevers and chills and increased blood sugars.Per MD notes, pt found to have a glucose level of 590 and was found to have DKA.CXR No acute cardiopulmonary disease.  Type of Study: Bedside swallow evaluation Previous Swallow Assessment:  (none) Diet Prior to this Study: Regular;Thin liquids Temperature Spikes Noted: No Respiratory Status:  Room air History of Recent Intubation: No Behavior/Cognition: Alert;Requires cueing;Confused (decreased coooperation) Oral Cavity - Dentition:  (no upper, 5 lower) Self-Feeding Abilities: Able to feed self;Needs assist;Needs set up Patient Positioning: Upright in bed Baseline Vocal Quality: Normal Volitional Cough:  (decreased coooperation) Volitional Swallow:  (decreased coooperation)    Oral/Motor/Sensory Function Overall Oral Motor/Sensory Function: Appears within functional limits for tasks assessed   Ice Chips Ice chips: Not tested   Thin Liquid Thin Liquid: Impaired Presentation: Cup;Straw Oral Phase Functional Implications: Prolonged oral transit Pharyngeal  Phase Impairments:  (difficulty coordinating respiration and swallow)    Nectar Thick Nectar Thick Liquid: Not tested   Honey Thick Honey Thick Liquid: Not tested   Puree Puree: Impaired Pharyngeal Phase Impairments:  (difficulty coordinating respiration and swallow)   Solid   GO    Solid: Impaired Oral Phase Impairments: Impaired anterior to posterior transit Oral Phase Functional Implications:  (prolonged manipulation)       Litaker, [name] 02/25/2015,9:01 AM  [name] [name].Ed [employer] [phone]

## 2015-02-25 NOTE — Progress Notes (Signed)
Patient Demographics  Beth Payne, is a 74 y.o. female, DOB - 22-Jul-1940, ZOX:[id number]  Admit date - 02/23/2015   Admitting Physician [name] Parker, MD  Outpatient Primary MD for the patient is [name], MD  LOS - 2   Chief Complaint  Patient presents with  . Hyperglycemia         Subjective:   Beth Payne today is less confused, complains of aching all over, denies any chest pain or shortness of breath..  Assessment & Plan    Principal Problem:   SIRS (systemic inflammatory response syndrome) Active Problems:   DKA (diabetic ketoacidoses)   CAD s/p stent to mid-RCA in 2001.   Normocytic anemia   DM (diabetes mellitus), type 1 with complications   Fracture, ankle   Acute encephalopathy   AKI (acute kidney injury)  Uncontrolled diabetes mellitus, DKA. - Patient with elevated anion gap on admission, required insulin drip initially, transitioned to subcutaneous Lantus and sliding scale after anion gap has closed, again on insulin drip yesterday as was not able to tolerate oral intake anion gap was trending up, now back on Lantus and insulin sliding scale. - Continue with Lantus 15 units subcutaneous at bedtime, and insulin sinus scale 3 times daily before meals - Glycohemoglobin A1c is 8.1, increased from 6.6 month ago  SIRS - Patient presents with fever, leukocytosis, elevated lactic acid and tachypnea - Negative urinalysis, no acute finding and chest x-ray, negative meningeal signs, blood cultures with no growth to date. - Possibly stress-related due to DKA - Initially on broad-spectrum IV vancomycin and Zosyn, will discontinue giving negative septic workup.  Hypertension - Initially uncontrolled, improved after  resuming home medication, admitted when necessary hydralazine  Acute encephalopathy - Most likely metabolic related to her DKA and SIRS, continue to  monitor clinically, appears to be having mild baseline dementia. - SLP consult appreciated - Improving  Acute renal failure - Resolved, back to baseline - Continue with IV fluids  History of coronary artery disease - Continue with aspirin and statin  Hyperlipidemia - Continue with statin  Dementia - Continue with Aricept  Hypokalemia - Repleted, continue to monitor and replace.  Code Status: Full  Family Communication: None at bedside  Disposition Plan: Pending further workup   Procedures  None   Consults   None   Medications  Scheduled Meds: . amLODipine  5 mg Oral Daily  . cholecalciferol  2,000 Units Oral Daily  . donepezil  10 mg Oral QHS  . enoxaparin (LOVENOX) injection  40 mg Subcutaneous Q24H  . insulin aspart  0-9 Units Subcutaneous TID WC  . insulin glargine  15 Units Subcutaneous QHS  . lamoTRIgine  50 mg Oral Daily  . LORazepam  0.5 mg Oral 3 times per day  . pantoprazole  40 mg Oral Daily  . piperacillin-tazobactam (ZOSYN)  IV  3.375 g Intravenous 3 times per day  . potassium chloride  40 mEq Oral Q3H  . prazosin  1 mg Oral Daily  . rosuvastatin  5 mg Oral Daily  . sertraline  150 mg Oral Daily  . vancomycin  500 mg Intravenous Q24H   Continuous Infusions: . sodium chloride 999 mL/hr at 02/24/15 1828  . 0.9 % NaCl with KCl  20 mEq / L 1,000 mL (02/25/15 0614)   PRN Meds:.acetaminophen **OR** acetaminophen, alum & mag hydroxide-simeth, hydrALAZINE, HYDROmorphone (DILAUDID) injection, ondansetron **OR** ondansetron (ZOFRAN) IV  DVT Prophylaxis  Lovenox   Lab Results  Component Value Date   PLT 220 02/25/2015    Antibiotics    Anti-infectives    Start     Dose/Rate Route Frequency Ordered Stop   02/24/15 1400  vancomycin (VANCOCIN) 500 mg in sodium chloride 0.9 % 100 mL IVPB     500 mg 100 mL/hr over 60 Minutes Intravenous Every 24 hours 02/23/15 1551     02/23/15 2200  piperacillin-tazobactam (ZOSYN) IVPB 3.375 g     3.375 g 12.5  mL/hr over 240 Minutes Intravenous 3 times per day 02/23/15 1551     02/23/15 1500  piperacillin-tazobactam (ZOSYN) IVPB 3.375 g     3.375 g 100 mL/hr over 30 Minutes Intravenous  Once 02/23/15 1445 02/23/15 1611   02/23/15 1500  vancomycin (VANCOCIN) IVPB 1000 mg/200 mL premix     1,000 mg 200 mL/hr over 60 Minutes Intravenous  Once 02/23/15 1445 02/23/15 1611          Objective:   Filed Vitals:   02/25/15 0600 02/25/15 0800 02/25/15 1045 02/25/15 1145  BP: 168/80 179/78 150/63 150/63  Pulse:  86    Temp:  98.7 F (37.1 C)  98.3 F (36.8 C)  TempSrc:  Oral  Oral  Resp: Height:      Weight:      SpO2:  97%  98%    Wt Readings from Last 3 Encounters:  02/24/15 48.5 kg (106 lb 14.8 oz)  02/20/15 53.797 kg (118 lb 9.6 oz)  01/01/15 52.4 kg (115 lb 8.3 oz)     Intake/Output Summary (Last 24 hours) at 02/25/15 1251 Last data filed at 02/25/15 1610  Gross per 24 hour  Intake 2844.91 ml  Output      0 ml  Net 2844.91 ml     Physical Exam  lethargic, confused Supple Neck,No JVD,  Symmetrical Chest wall movement, Good air movement bilaterally,  RRR,No Gallops,Rubs or new Murmurs, No Parasternal Heave +ve B.Sounds, Abd Soft, No tenderness, No organomegaly appriciated,  No Cyanosis, Clubbing or edema, No new Rash or bruise     Data Review   Micro Results Recent Results (from the past 240 hour(s))  Blood Culture (routine x 2)     Status: None (Preliminary result)   Collection Time: 02/23/15  2:00 PM  Result Value Ref Range Status   Specimen Description BLOOD RIGHT ANTECUBITAL  Final   Special Requests BOTTLES DRAWN AEROBIC AND ANAEROBIC 3CC  Final   Culture NO GROWTH < 24 HOURS  Final   Report Status PENDING  Incomplete  Blood Culture (routine x 2)     Status: None (Preliminary result)   Collection Time: 02/23/15  2:14 PM  Result Value Ref Range Status   Specimen Description BLOOD RIGHT ANTECUBITAL  Final   Special Requests BOTTLES DRAWN AEROBIC AND  ANAEROBIC 5CC  Final   Culture NO GROWTH < 24 HOURS  Final   Report Status PENDING  Incomplete  Urine culture     Status: None   Collection Time: 02/23/15  2:33 PM  Result Value Ref Range Status   Specimen Description URINE, CLEAN CATCH  Final   Special Requests NONE  Final   Culture MULTIPLE SPECIES PRESENT, SUGGEST RECOLLECTION  Final   Report Status 02/24/2015 FINAL  Final  MRSA PCR Screening     Status: None   Collection Time: 02/24/15 10:43 AM  Result Value Ref Range Status   MRSA by PCR NEGATIVE NEGATIVE Final    Comment:        The GeneXpert MRSA Assay (FDA approved for NASAL specimens only), is one component of a comprehensive MRSA colonization surveillance program. It is not intended to diagnose MRSA infection nor to guide or monitor treatment for MRSA infections.     Radiology Reports Dg Chest Port 1 View  02/23/2015   CLINICAL DATA:  Weakness. History of coronary artery disease, TIA and diabetes.  EXAM: PORTABLE CHEST - 1 VIEW  COMPARISON:  12/31/2014  FINDINGS: Cardiac silhouette is normal in size and configuration. No mediastinal or hilar masses or evidence of adenopathy. Clear lungs. No pleural effusion or pneumothorax.  Bony thorax is grossly intact.  IMPRESSION: No acute cardiopulmonary disease.   Electronically Signed   By: Amie Portland M.D.   On: 02/23/2015 14:42     CBC  Recent Labs Lab 02/23/15 1428 02/24/15 0527 02/25/15 0307  WBC 20.3* 17.7* 12.0*  HGB 10.2* 8.7* 9.1*  HCT 31.6* 26.0* 27.2*  PLT 288 222 220  MCV 85.2 82.8 82.2  MCH 27.5 27.7 27.5  MCHC 32.3 33.5 33.5  RDW 15.8* 15.6* 15.5  LYMPHSABS 1.5  --   --   MONOABS 2.2*  --   --   EOSABS 0.0  --   --   BASOSABS 0.0  --   --     Chemistries   Recent Labs Lab 02/23/15 1427  02/23/15 2205 02/24/15 0527 02/24/15 1350 02/25/15 0041 02/25/15 0307  NA 137  < > 140 134* 139 139 137  K 3.8  < > 3.2* 3.1* 3.0* 3.0* 3.0*  CL 105  < > 110 103 105 110 107  CO2 14*  < > 18* 17* 19* 21*  19*  GLUCOSE 586*  < > 142* 278* 211* 157* 173*  BUN 36*  < > CREATININE 1.71*  < > 0.94 0.93 0.86 0.72 0.78  CALCIUM 9.4  < > 8.8* 8.7* 9.3 8.8* 8.7*  MG  --   --   --   --  1.8  --  1.8  AST 31  --   --   --   --   --   --   ALT 24  --   --   --   --   --   --   ALKPHOS 87  --   --   --   --   --   --   BILITOT 0.9  --   --   --   --   --   --   < > = values in this interval not displayed. ------------------------------------------------------------------------------------------------------------------ estimated creatinine clearance is 45 mL/min (by C-G formula based on Cr of 0.78). ------------------------------------------------------------------------------------------------------------------  Recent Labs  02/23/15 2205  HGBA1C 8.1*   ------------------------------------------------------------------------------------------------------------------ No results for input(s): CHOL, HDL, LDLCALC, TRIG, CHOLHDL, LDLDIRECT in the last 72 hours. ------------------------------------------------------------------------------------------------------------------ No results for input(s): TSH, T4TOTAL, T3FREE, THYROIDAB in the last 72 hours.  Invalid input(s): FREET3 ------------------------------------------------------------------------------------------------------------------ No results for input(s): VITAMINB12, FOLATE, FERRITIN, TIBC, IRON, RETICCTPCT in the last 72 hours.  Coagulation profile  Recent Labs Lab 02/23/15 2205  INR 1.15    No results for input(s): DDIMER in the last 72 hours.  Cardiac Enzymes No results for input(s): CKMB, TROPONINI, MYOGLOBIN in the last  168 hours.  Invalid input(s): CK ------------------------------------------------------------------------------------------------------------------ Invalid input(s): POCBNP     Time Spent in minutes   40 minutes   ELGERGAWY, DAWOOD M.D on 02/25/2015 at 12:51 PM  Between 7am to 7pm - Pager  - [phone]  After 7pm go to [email or web address] - password [facility]  [facility]   Office  [phone]

## 2015-02-25 NOTE — Progress Notes (Signed)
Informed Md Elgergaway that the patient didn't not take her morning meds because she spits them on the floor.

## 2015-02-25 NOTE — Progress Notes (Signed)
[facility]  Patient Status: Active (receiving services up to time of hospitalization)  [facility] is providing the following services: PT and OT  If patient discharges after hours, please call [phone].   Beth Payne 02/25/2015, 3:28 PM

## 2015-02-26 DIAGNOSIS — A419 Sepsis, unspecified organism: Principal | ICD-10-CM

## 2015-02-26 LAB — GLUCOSE, CAPILLARY
GLUCOSE-CAPILLARY: 40 mg/dL — AB (ref 65–99)
GLUCOSE-CAPILLARY: 60 mg/dL — AB (ref 65–99)
GLUCOSE-CAPILLARY: 162 mg/dL — AB (ref 65–99)
Glucose-Capillary: 104 mg/dL — ABNORMAL HIGH (ref 65–99)
Glucose-Capillary: 206 mg/dL — ABNORMAL HIGH (ref 65–99)
Glucose-Capillary: 110 mg/dL — ABNORMAL HIGH (ref 65–99)

## 2015-02-26 LAB — COMPREHENSIVE METABOLIC PANEL
ALBUMIN: 3.3 g/dL — AB (ref 3.5–5.0)
ALK PHOS: 72 U/L (ref 38–126)
ALT: 30 U/L (ref 14–54)
ANION GAP: 5 (ref 5–15)
AST: 38 U/L (ref 15–41)
BUN: <5 mg/dL — ABNORMAL LOW (ref 6–20)
CHLORIDE: 108 mmol/L (ref 101–111)
CO2: 23 mmol/L (ref 22–32)
Calcium: 9.1 mg/dL (ref 8.9–10.3)
Creatinine, Ser: 0.57 mg/dL (ref 0.44–1.00)
GFR calc non Af Amer: >60 mL/min (ref >60)
GLUCOSE: 103 mg/dL — AB (ref 65–99)
Potassium: 3.6 mmol/L (ref 3.5–5.1)
SODIUM: 136 mmol/L (ref 135–145)
Total Bilirubin: 0.6 mg/dL (ref 0.3–1.2)
Total Protein: 5.7 g/dL — ABNORMAL LOW (ref 6.5–8.1)

## 2015-02-26 LAB — CBC
HCT: 30.4 % — ABNORMAL LOW (ref 36.0–46.0)
HEMOGLOBIN: 10.1 g/dL — AB (ref 12.0–15.0)
MCH: 27.6 pg (ref 26.0–34.0)
MCHC: 33.2 g/dL (ref 30.0–36.0)
MCV: 83.1 fL (ref 78.0–100.0)
PLATELETS: 217 10*3/uL (ref 150–400)
RBC: 3.66 MIL/uL — AB (ref 3.87–5.11)
RDW: 15.6 % — ABNORMAL HIGH (ref 11.5–15.5)
WBC: 8.3 10*3/uL (ref 4.0–10.5)

## 2015-02-26 LAB — MAGNESIUM: MAGNESIUM: 1.5 mg/dL — AB (ref 1.7–2.4)

## 2015-02-26 LAB — PHOSPHORUS: Phosphorus: 1.1 mg/dL — ABNORMAL LOW (ref 2.5–4.6)

## 2015-02-26 MED ORDER — POTASSIUM PHOSPHATES 15 MMOLE/5ML IV SOLN
20.0000 mmol | Freq: Once | INTRAVENOUS | Status: AC
  Administered 2015-02-26: 20 mmol via INTRAVENOUS
  Filled 2015-02-26: qty 6.67

## 2015-02-26 MED ORDER — INSULIN GLARGINE 100 UNIT/ML ~~LOC~~ SOLN
10.0000 [IU] | Freq: Every day | SUBCUTANEOUS | Status: DC
  Administered 2015-02-26: 10 [IU] via SUBCUTANEOUS
  Filled 2015-02-26 (×2): qty 0.1

## 2015-02-26 MED ORDER — MAGNESIUM SULFATE 2 GM/50ML IV SOLN
2.0000 g | Freq: Once | INTRAVENOUS | Status: AC
  Administered 2015-02-26: 2 g via INTRAVENOUS
  Filled 2015-02-26: qty 50

## 2015-02-26 MED ORDER — MAGNESIUM SULFATE 2 GM/50ML IV SOLN: 2.0000 g | Freq: Once | INTRAVENOUS | Status: DC

## 2015-02-26 NOTE — Progress Notes (Signed)
Inpatient Diabetes Program Recommendations  AACE/ADA: New Consensus Statement on Inpatient Glycemic Control (2015)  Target Ranges:  Prepandial:   less than 140 mg/dL      Peak postprandial:   less than 180 mg/dL (1-2 hours)      Critically ill patients:  140 - 180 mg/dL   Called patients son to inquire about insulin pump. He states that there is a caregiver, Beth Payne, that comes by once or twice a day. Patient reports that Beth Payne helps his mother with the pump and if switched to insulin he would have to help with administration with that as well. Patients son asked about possibly having a RN come by with Home Health to possibly help with it. I spoke with Beth Payne, CM and she reports her insurance does not cover for an RN to manage an insulin pump.  I called the patients Endocrinologist office and spoke with Dr. Rinaldo Cloud RN that is also the CDE there. Beth Payne reports the patient was seen on 02/11/15 and at that time, Beth Payne took the patient off of her insulin pump and placed on Lantus 15 units Daily, Humalog Correction (151-200: 1 units, 201-250: 2 units, 251-300: 3 units, 301-350: 4 units, 351-400: 5 units, >400: 6 units), Humalog 3 units Meal Coverage TID.  Patient reported on admission she wore a pump. Patient was confused. Patient has not been on a pump since 02/11/2015. Apparently patient's son is also not aware of the insulin administration change as he was not aware of SQ insulin at the time that we spoke.  Resume SQ insulin at home. Caregiver helps patient with insulin administration. Awaiting call back from caregiver to inquire about insulin regimen.  Beth Deem RN, Beth Payne, [facility] Inpatient Diabetes Coordinator Team Pager [phone] (8a-5p)

## 2015-02-26 NOTE — Care Management Important Message (Signed)
Important Message  Patient Details  Name: Beth Payne MRN: [id number] Date of Birth: November 06, 1940   Medicare Important Message Given:  Yes-second notification given    Orson Aloe 02/26/2015, 11:53 AM

## 2015-02-26 NOTE — Consult Note (Signed)
   [facility] CM Inpatient Consult   02/26/2015  Beth Payne 1940-11-20 [id number] Referral received for frequent hospital stays. Met with the patient at bedside.  Patient was able to give birth date and street address but states she couldn't remember if she has had home health care or who provides her transportation.  She endorses that she lives alone but has help that comes in and prepares her food.  She endorses that this is a little overwhelming.  Gave the patient a brochure and contact information.  She states, "I really appreciate you coming by and come back to check on me after I read this."  Will plan to follow up.  Updated inpatient RNCM, Debbie, regarding referral and follow up.  Of note, [facility] Care Management does not interfere with or replace any services needed for this patient.  For questions, please contact: Natividad Brood, RN BSN [facility] Liaison  [phone] business mobile phone

## 2015-02-26 NOTE — Progress Notes (Signed)
Md made aware of phos 1.1 and mag 1.5 am labs.  Will continue to monitor [name]

## 2015-02-26 NOTE — Progress Notes (Signed)
Speech Language Pathology Treatment: Dysphagia  Patient Details Name: Beth Payne MRN: [id number] DOB: 14-Mar-1941 Today's Date: 02/26/2015 Time: 7564-3329 SLP Time Calculation (min) (ACUTE ONLY): 18 min  Assessment / Plan / Recommendation Clinical Impression  Pt seen for dysphagia treatment. Dys 2 appears functional for pt, do not recommend upgrade at present due to mild lingual residue (pt reluctant and did not desire to attempt solid texture). Delayed cough x 1 as SLP leaving room possibly from larger straw sips. She attempted to remove lingual residue independently. Will continue short term for ability to upgrade to increase texture.    HPI Other Pertinent Information: 74 y.o. female with a history of DM, CAD and recent ORIF Left Ankle, TIA, fibromyalgia, GERD, chronic lower back pain, ACDF 2004 admitted with complaints of lethargy and increasing confusion along with fevers and chills and increased blood sugars.Per MD notes, pt found to have a glucose level of 590 and was found to have DKA.CXR No acute cardiopulmonary disease.    Pertinent Vitals Pain Assessment: No/denies pain  SLP Plan  Continue with current plan of care    Recommendations Diet recommendations: Dysphagia 2 (fine chop);Thin liquid Liquids provided via: Cup;Straw Medication Administration: Whole meds with puree Supervision: Patient able to self feed;Intermittent supervision to cue for compensatory strategies Compensations: Slow rate;Small sips/bites;Check for pocketing              Oral Care Recommendations: Oral care BID Follow up Recommendations: None Plan: Continue with current plan of care    GO     Royce Macadamia 02/26/2015, 8:55 AM  [name] [name].Ed [employer] [phone]

## 2015-02-26 NOTE — Progress Notes (Addendum)
Inpatient Diabetes Program Recommendations  AACE/ADA: New Consensus Statement on Inpatient Glycemic Control (2015)  Target Ranges:  Prepandial:   less than 140 mg/dL      Peak postprandial:   less than 180 mg/dL (1-2 hours)      Critically ill patients:  140 - 180 mg/dL   Review of Glycemic Control  Diabetes history: DM 1 Outpatient Diabetes medications: Insulin pump Current orders for Inpatient glycemic control: Lantus 15 units QHS, Novolog Sensitive TID  Recommendations: Due to hypoglycemia please reduce basal dose to Lantus 10 units QHS. Her basal rates in her pump are 12 units in a 24hr period.  Inpatient Diabetes Program Consult Note:  Patient has had DM 1 since the 1950's. Patient was d/c'd to SNF in July/2016 and was unable to resume her pump at that time due to the patients dependence on a female caregiver to calculate boluses for meals and refill her insulin cartridge. She is not self sufficient when her caregiver is unavailable. Her Endocrinologist is Dr. Evlyn Kanner. She was recently d/c'd from SNF on 9/15 with home health. If there are concerns with her medication regimen, she should f/u with Dr. Evlyn Kanner at time of discharge. If there are concerns before that time may want to call Dr. Evlyn Kanner for recommendations.  Due to the patient being admitted with confusion, fever, and chills, she more than likely did not operate her pump correctly. Unless her caregiver stays at the hospital the pump should NOT be resumed while inpatient. Once home she should be able to operate her pump with a NEW insulin pump site.  Thanks,  Christena Deem RN, MSN, [facility] Inpatient Diabetes Coordinator Team Pager [phone] (8a-5p)

## 2015-02-26 NOTE — Progress Notes (Signed)
Found pt trying to get out of bed.  Feet dangling over rail.  Pt stated  "need to get up".  Re-oriented pt.  Call bell in reach and bed alarm still on.  Will continue to monitor [name]

## 2015-02-26 NOTE — Care Management Note (Signed)
Case Management Note  Patient Details  Name: LINSEY ARTEAGA MRN: [id number] Date of Birth: 1940-10-17  Subjective/Objective:       Adm w sirs             Action/Plan:lives at home, pcp dr Laurene Footman, act w [facility] for hhpt and hhot.   Expected Discharge Date:                  Expected Discharge Plan:  Home w Home Health Services  In-House Referral:  Clinical Social Work  Discharge planning Services     Post Acute Care Choice:  Resumption of Svcs/PTA [place] Choice offered to:     DME Arranged:    DME Agency:     HH Arranged:  PT, OT HH Agency:  [facility]  Status of Service:     Medicare Important Message Given:    Date Medicare IM Given:    Medicare IM give by:    Date Additional Medicare IM Given:    Additional Medicare Important Message give by:     If discussed at Long Length of Stay Meetings, dates discussed:    Additional Comments: confused at present. Will make sw ref in case needs snf .  Hanley Hays, RN 02/26/2015, 9:35 AM

## 2015-02-26 NOTE — Progress Notes (Signed)
Patient Demographics  Beth Payne, is a 74 y.o. female, DOB - 05/13/1941, WJX:[id number]  Admit date - 02/23/2015   Admitting Physician [name] Parker, MD  Outpatient Primary MD for the patient is [name], MD  LOS - 3   Chief Complaint  Patient presents with  . Hyperglycemia         Subjective:   Beth Payne today is less confused, denies any headache, no chest abdominal pain or weakness. Feels better. Assessment & Plan    DKA in a DM2 patient.  - A1c 8.1 which I think is acceptable at her age, DKA resolved after IV fluids and IV insulin. She uses insulin pump at home but she is not sure anything about her pump functioning and monitoring. There is a caregiver at home who adjusts her insulin pump.  Will have diabetic educator evaluate her pump usage, if I'm not too confident about her pump usage and safety I might put her on Lantus and sliding scale which might be more safe for her at this time.    SIRS - Patient presented with fever, leukocytosis, elevated lactic acid and tachypnea, could have had viral syndrome. She subsequently had negative UA, chest x-ray, cultures remain negative. She was initially on broad-spectrum antibiotic switch were stopped on 02/25/2015. She is stable now.   Hypertension - Initially uncontrolled, improved after  resuming home medication, admitted when necessary hydralazine   Acute encephalopathy - Most likely metabolic related to her DKA and SIRS, continue to monitor clinically, appears to be having mild baseline dementia. - Speech following currently on dysphagia 2 diet, continue supportive care evaluate for placement.   Acute renal failure - Resolved, back to baseline - Continue with IV fluids   History of coronary artery disease - Continue with aspirin and statin   Hyperlipidemia - Continue with statin   Dementia - Continue  with Aricept   Hypokalemia - Repleted, continue to monitor and replace.    Code Status: Full  Family Communication: None at bedside  Disposition Plan: Pending further workup  DVT Prophylaxis - lovenox   Procedures  None   Consults   None   Medications  Scheduled Meds: . amLODipine  5 mg Oral Daily  . cholecalciferol  2,000 Units Oral Daily  . donepezil  10 mg Oral QHS  . enoxaparin (LOVENOX) injection  40 mg Subcutaneous Q24H  . insulin aspart  0-9 Units Subcutaneous TID WC  . insulin glargine  10 Units Subcutaneous QHS  . lamoTRIgine  50 mg Oral Daily  . LORazepam  0.5 mg Oral 3 times per day  . pantoprazole  40 mg Oral Daily  . potassium phosphate IVPB (mmol)  20 mmol Intravenous Once  . prazosin  1 mg Oral Daily  . rosuvastatin  5 mg Oral Daily  . sertraline  150 mg Oral Daily   Continuous Infusions:   PRN Meds:.acetaminophen **OR** [DISCONTINUED] acetaminophen, alum & mag hydroxide-simeth, hydrALAZINE, [DISCONTINUED] ondansetron **OR** ondansetron (ZOFRAN) IV  DVT Prophylaxis  Lovenox   Lab Results  Component Value Date   PLT 217 02/26/2015    Antibiotics    Anti-infectives    Start     Dose/Rate Route Frequency Ordered Stop   02/24/15 1400  vancomycin (VANCOCIN) 500  mg in sodium chloride 0.9 % 100 mL IVPB  Status:  Discontinued     500 mg 100 mL/hr over 60 Minutes Intravenous Every 24 hours 02/23/15 1551 02/25/15 1254   02/23/15 2200  piperacillin-tazobactam (ZOSYN) IVPB 3.375 g  Status:  Discontinued     3.375 g 12.5 mL/hr over 240 Minutes Intravenous 3 times per day 02/23/15 1551 02/25/15 1254   02/23/15 1500  piperacillin-tazobactam (ZOSYN) IVPB 3.375 g     3.375 g 100 mL/hr over 30 Minutes Intravenous  Once 02/23/15 1445 02/23/15 1611   02/23/15 1500  vancomycin (VANCOCIN) IVPB 1000 mg/200 mL premix     1,000 mg 200 mL/hr over 60 Minutes Intravenous  Once 02/23/15 1445 02/23/15 1611          Objective:   Filed Vitals:   02/26/15  0304 02/26/15 0447 02/26/15 0800 02/26/15 1101  BP: 133/36 173/68 117/44 159/72  Pulse:      Temp: 98.4 F (36.9 C)  98.6 F (37 C)   TempSrc: Oral  Oral   Resp: 21  23   Height:      Weight:      SpO2: 96%  98%     Wt Readings from Last 3 Encounters:  02/24/15 48.5 kg (106 lb 14.8 oz)  02/20/15 53.797 kg (118 lb 9.6 oz)  01/01/15 52.4 kg (115 lb 8.3 oz)     Intake/Output Summary (Last 24 hours) at 02/26/15 1132 Last data filed at 02/26/15 0616  Gross per 24 hour  Intake 2816.67 ml  Output    225 ml  Net 2591.67 ml     Physical Exam Awake alert, oriented x 2 Supple Neck,No JVD,  Symmetrical Chest wall movement, Good air movement bilaterally,  RRR,No Gallops,Rubs or new Murmurs, No Parasternal Heave +ve B.Sounds, Abd Soft, No tenderness, No organomegaly appriciated,  No Cyanosis, Clubbing or edema, No new Rash or bruise     Data Review   Micro Results Recent Results (from the past 240 hour(s))  Blood Culture (routine x 2)     Status: None (Preliminary result)   Collection Time: 02/23/15  2:00 PM  Result Value Ref Range Status   Specimen Description BLOOD RIGHT ANTECUBITAL  Final   Special Requests BOTTLES DRAWN AEROBIC AND ANAEROBIC 3CC  Final   Culture NO GROWTH 2 DAYS  Final   Report Status PENDING  Incomplete  Blood Culture (routine x 2)     Status: None (Preliminary result)   Collection Time: 02/23/15  2:14 PM  Result Value Ref Range Status   Specimen Description BLOOD RIGHT ANTECUBITAL  Final   Special Requests BOTTLES DRAWN AEROBIC AND ANAEROBIC 5CC  Final   Culture NO GROWTH 2 DAYS  Final   Report Status PENDING  Incomplete  Urine culture     Status: None   Collection Time: 02/23/15  2:33 PM  Result Value Ref Range Status   Specimen Description URINE, CLEAN CATCH  Final   Special Requests NONE  Final   Culture MULTIPLE SPECIES PRESENT, SUGGEST RECOLLECTION  Final   Report Status 02/24/2015 FINAL  Final  MRSA PCR Screening     Status: None    Collection Time: 02/24/15 10:43 AM  Result Value Ref Range Status   MRSA by PCR NEGATIVE NEGATIVE Final    Comment:        The GeneXpert MRSA Assay (FDA approved for NASAL specimens only), is one component of a comprehensive MRSA colonization surveillance program. It is not intended to diagnose MRSA  infection nor to guide or monitor treatment for MRSA infections.     Radiology Reports Dg Chest Port 1 View  02/23/2015   CLINICAL DATA:  Weakness. History of coronary artery disease, TIA and diabetes.  EXAM: PORTABLE CHEST - 1 VIEW  COMPARISON:  12/31/2014  FINDINGS: Cardiac silhouette is normal in size and configuration. No mediastinal or hilar masses or evidence of adenopathy. Clear lungs. No pleural effusion or pneumothorax.  Bony thorax is grossly intact.  IMPRESSION: No acute cardiopulmonary disease.   Electronically Signed   By: Amie Portland M.D.   On: 02/23/2015 14:42     CBC  Recent Labs Lab 02/23/15 1428 02/24/15 0527 02/25/15 0307 02/26/15 0242  WBC 20.3* 17.7* 12.0* 8.3  HGB 10.2* 8.7* 9.1* 10.1*  HCT 31.6* 26.0* 27.2* 30.4*  PLT 288 222 220 217  MCV 85.2 82.8 82.2 83.1  MCH 27.5 27.7 27.5 27.6  MCHC 32.3 33.5 33.5 33.2  RDW 15.8* 15.6* 15.5 15.6*  LYMPHSABS 1.5  --   --   --   MONOABS 2.2*  --   --   --   EOSABS 0.0  --   --   --   BASOSABS 0.0  --   --   --     Chemistries   Recent Labs Lab 02/23/15 1427  02/24/15 0527 02/24/15 1350 02/25/15 0041 02/25/15 0307 02/26/15 0242  NA 137  < > 134* 139 139 137 136  K 3.8  < > 3.1* 3.0* 3.0* 3.0* 3.6  CL 105  < > 103 105 110 107 108  CO2 14*  < > 17* 19* 21* 19* 23  GLUCOSE 586*  < > 278* 211* 157* 173* 103*  BUN 36*  < > <5*  CREATININE 1.71*  < > 0.93 0.86 0.72 0.78 0.57  CALCIUM 9.4  < > 8.7* 9.3 8.8* 8.7* 9.1  MG  --   --   --  1.8  --  1.8 1.5*  AST 31  --   --   --   --   --  38  ALT 24  --   --   --   --   --  30  ALKPHOS 87  --   --   --   --   --  72  BILITOT 0.9  --   --   --   --    --  0.6  < > = values in this interval not displayed. ------------------------------------------------------------------------------------------------------------------ estimated creatinine clearance is 45 mL/min (by C-G formula based on Cr of 0.57). ------------------------------------------------------------------------------------------------------------------  Recent Labs  02/23/15 2205  HGBA1C 8.1*   ------------------------------------------------------------------------------------------------------------------ No results for input(s): CHOL, HDL, LDLCALC, TRIG, CHOLHDL, LDLDIRECT in the last 72 hours. ------------------------------------------------------------------------------------------------------------------ No results for input(s): TSH, T4TOTAL, T3FREE, THYROIDAB in the last 72 hours.  Invalid input(s): FREET3 ------------------------------------------------------------------------------------------------------------------ No results for input(s): VITAMINB12, FOLATE, FERRITIN, TIBC, IRON, RETICCTPCT in the last 72 hours.  Coagulation profile  Recent Labs Lab 02/23/15 2205  INR 1.15    No results for input(s): DDIMER in the last 72 hours.  Cardiac Enzymes No results for input(s): CKMB, TROPONINI, MYOGLOBIN in the last 168 hours.  Invalid input(s): CK ------------------------------------------------------------------------------------------------------------------ Invalid input(s): POCBNP     Time Spent in minutes   40 minutes   Susa Raring K M.D on 02/26/2015 at 11:32 AM  Between 7am to 7pm - Pager - [phone]  After 7pm go to [email or web address] - password [facility]  [facility]   Office  [phone]      

## 2015-02-26 NOTE — Progress Notes (Signed)
PT Cancellation Note  Patient Details Name: Beth Payne MRN: [id number] DOB: 1941/05/07   Cancelled Treatment:    Reason Eval/Treat Not Completed: Fatigue/lethargy limiting ability to participate. Pt sleeping when PT arrived. Let pt rest and checked back 45 minutes later. Pt still  unable to be aroused enough to participate with session. Will continue to follow and complete PT eval when able.    Conni Slipper 02/26/2015, 2:59 PM   Conni Slipper, PT, DPT Acute Rehabilitation Services Pager: [phone]

## 2015-02-27 DIAGNOSIS — E10319 Type 1 diabetes mellitus with unspecified diabetic retinopathy without macular edema: Secondary | ICD-10-CM | POA: Diagnosis not present

## 2015-02-27 DIAGNOSIS — I10 Essential (primary) hypertension: Secondary | ICD-10-CM | POA: Diagnosis not present

## 2015-02-27 DIAGNOSIS — K5900 Constipation, unspecified: Secondary | ICD-10-CM | POA: Diagnosis not present

## 2015-02-27 DIAGNOSIS — F419 Anxiety disorder, unspecified: Secondary | ICD-10-CM | POA: Diagnosis not present

## 2015-02-27 DIAGNOSIS — E1010 Type 1 diabetes mellitus with ketoacidosis without coma: Secondary | ICD-10-CM | POA: Diagnosis not present

## 2015-02-27 DIAGNOSIS — M6281 Muscle weakness (generalized): Secondary | ICD-10-CM | POA: Diagnosis not present

## 2015-02-27 DIAGNOSIS — D509 Iron deficiency anemia, unspecified: Secondary | ICD-10-CM | POA: Diagnosis not present

## 2015-02-27 DIAGNOSIS — E1142 Type 2 diabetes mellitus with diabetic polyneuropathy: Secondary | ICD-10-CM | POA: Diagnosis not present

## 2015-02-27 DIAGNOSIS — E1069 Type 1 diabetes mellitus with other specified complication: Secondary | ICD-10-CM | POA: Diagnosis not present

## 2015-02-27 DIAGNOSIS — R5381 Other malaise: Secondary | ICD-10-CM | POA: Diagnosis not present

## 2015-02-27 DIAGNOSIS — Z5189 Encounter for other specified aftercare: Secondary | ICD-10-CM | POA: Diagnosis not present

## 2015-02-27 DIAGNOSIS — S82852D Displaced trimalleolar fracture of left lower leg, subsequent encounter for closed fracture with routine healing: Secondary | ICD-10-CM | POA: Diagnosis not present

## 2015-02-27 DIAGNOSIS — D649 Anemia, unspecified: Secondary | ICD-10-CM | POA: Diagnosis not present

## 2015-02-27 DIAGNOSIS — K219 Gastro-esophageal reflux disease without esophagitis: Secondary | ICD-10-CM | POA: Diagnosis not present

## 2015-02-27 DIAGNOSIS — M545 Low back pain: Secondary | ICD-10-CM | POA: Diagnosis not present

## 2015-02-27 DIAGNOSIS — E785 Hyperlipidemia, unspecified: Secondary | ICD-10-CM | POA: Diagnosis not present

## 2015-02-27 DIAGNOSIS — F0390 Unspecified dementia without behavioral disturbance: Secondary | ICD-10-CM | POA: Diagnosis not present

## 2015-02-27 DIAGNOSIS — S82892A Other fracture of left lower leg, initial encounter for closed fracture: Secondary | ICD-10-CM | POA: Diagnosis not present

## 2015-02-27 DIAGNOSIS — M14679 Charcot's joint, unspecified ankle and foot: Secondary | ICD-10-CM | POA: Diagnosis not present

## 2015-02-27 DIAGNOSIS — F329 Major depressive disorder, single episode, unspecified: Secondary | ICD-10-CM | POA: Diagnosis not present

## 2015-02-27 DIAGNOSIS — F339 Major depressive disorder, recurrent, unspecified: Secondary | ICD-10-CM | POA: Diagnosis not present

## 2015-02-27 DIAGNOSIS — I2510 Atherosclerotic heart disease of native coronary artery without angina pectoris: Secondary | ICD-10-CM | POA: Diagnosis not present

## 2015-02-27 DIAGNOSIS — E119 Type 2 diabetes mellitus without complications: Secondary | ICD-10-CM | POA: Diagnosis not present

## 2015-02-27 DIAGNOSIS — R278 Other lack of coordination: Secondary | ICD-10-CM | POA: Diagnosis not present

## 2015-02-27 DIAGNOSIS — F322 Major depressive disorder, single episode, severe without psychotic features: Secondary | ICD-10-CM | POA: Diagnosis not present

## 2015-02-27 DIAGNOSIS — A419 Sepsis, unspecified organism: Secondary | ICD-10-CM | POA: Diagnosis not present

## 2015-02-27 DIAGNOSIS — R6520 Severe sepsis without septic shock: Secondary | ICD-10-CM | POA: Diagnosis not present

## 2015-02-27 DIAGNOSIS — M810 Age-related osteoporosis without current pathological fracture: Secondary | ICD-10-CM | POA: Diagnosis not present

## 2015-02-27 DIAGNOSIS — R413 Other amnesia: Secondary | ICD-10-CM | POA: Diagnosis not present

## 2015-02-27 DIAGNOSIS — I129 Hypertensive chronic kidney disease with stage 1 through stage 4 chronic kidney disease, or unspecified chronic kidney disease: Secondary | ICD-10-CM | POA: Diagnosis not present

## 2015-02-27 DIAGNOSIS — Z681 Body mass index (BMI) 19 or less, adult: Secondary | ICD-10-CM | POA: Diagnosis not present

## 2015-02-27 DIAGNOSIS — R1311 Dysphagia, oral phase: Secondary | ICD-10-CM | POA: Diagnosis not present

## 2015-02-27 DIAGNOSIS — E0810 Diabetes mellitus due to underlying condition with ketoacidosis without coma: Secondary | ICD-10-CM | POA: Diagnosis not present

## 2015-02-27 DIAGNOSIS — R262 Difficulty in walking, not elsewhere classified: Secondary | ICD-10-CM | POA: Diagnosis not present

## 2015-02-27 LAB — GLUCOSE, CAPILLARY
GLUCOSE-CAPILLARY: 193 mg/dL — AB (ref 65–99)
Glucose-Capillary: 115 mg/dL — ABNORMAL HIGH (ref 65–99)

## 2015-02-27 LAB — MAGNESIUM: MAGNESIUM: 2 mg/dL (ref 1.7–2.4)

## 2015-02-27 MED ORDER — INSULIN ASPART 100 UNIT/ML ~~LOC~~ SOLN: SUBCUTANEOUS | Status: DC

## 2015-02-27 MED ORDER — INSULIN GLARGINE 100 UNIT/ML ~~LOC~~ SOLN: 12.0000 [IU] | Freq: Every day | SUBCUTANEOUS | Status: DC

## 2015-02-27 NOTE — Consult Note (Signed)
   [facility] CM Inpatient Consult   02/27/2015  Beth Payne 03/03/1941 [id number] Met with the patient and friend at the bedside.  Patient agreed that her friend (female) could remain in the room.  Explained services regarding [facility].  They state that the patient is scheduled to go to [facility] and that the patient was active with [facility] for home care needs.  She states she would contact [facility] if she has any difficulties with her transition back home.  She is planning on [facility] for her rehab. Emphasized that [facility] does not replace or interfere with any services.  Contact information and brochure was given to her friend.  For questions, please contact: Natividad Brood, RN BSN [facility] Liaison  [phone] business mobile phone

## 2015-02-27 NOTE — Discharge Summary (Addendum)
Beth Payne, is a 74 y.o. female  DOB 1941/05/21  MRN [id number].  Admission date:  02/23/2015  Admitting Physician  [name] Parker, MD  Discharge Date:  02/27/2015   Primary MD  [name], MD  Recommendations for primary care physician for things to follow:   Monitor glycemic control closely, check CBC BMP next visit   Admission Diagnosis  Weak [R53.1] Severe sepsis [A41.9, R65.20] Diabetic ketoacidosis without coma associated with diabetes mellitus due to underlying condition [E08.10]   Discharge Diagnosis  Weak [R53.1] Severe sepsis [A41.9, R65.20] Diabetic ketoacidosis without coma associated with diabetes mellitus due to underlying condition [E08.10]    Principal Problem:   SIRS (systemic inflammatory response syndrome) Active Problems:   DKA (diabetic ketoacidoses)   CAD s/p stent to mid-RCA in 2001.   Normocytic anemia   DM (diabetes mellitus), type 1 with complications   Fracture, ankle   Acute encephalopathy   AKI (acute kidney injury)      Past Medical History  Diagnosis Date  . Coronary artery disease   . Depression   . Retinopathy   . Anxiety   . Heart murmur   . TIA (transient ischemic attack)   . Arthritis     "fingers" (12/23/2014)  . Fibromyalgia   . Type I diabetes mellitus dx'd 1953    "I'm on a pup" (12/23/2014)  . Chronic lower back pain   . GERD (gastroesophageal reflux disease) dx'd 1995  . Osteopenia dx'd 2007    Past Surgical History  Procedure Laterality Date  . Cataract extraction w/ intraocular lens  implant, bilateral Bilateral 1998  . Tonsillectomy    . Appendectomy    . Bilateral carpal tunnel release Bilateral 2003-2004  . Back surgery    . Anterior cervical decomp/discectomy fusion  05/2003    "C5-6"  . Abdominal hysterectomy  1972  .  Dilation and curettage of uterus    . Cesarean section    . Eye surgery Bilateral since 1977    "numerous slaser surgeries for retinopathy"  . Trigger finger release Bilateral 2003  . Cervical disc surgery  2004    "collapsed C-5"  . Fracture surgery    . Patella fracture surgery Left 04/2007  . Vertebroplasty  01/2008    "compressed lumbar fracture"  . Laparoscopic lysis of adhesions  1975-1987 X 5  . Exploratory laparotomy  4098-1191 X 1    "adhesions"  . Oophorectomy Bilateral 1980's  . Vitrectomy Right 08/1997  . Bunionectomy with hammertoe reconstruction Right 02/1999  . Coronary angioplasty with stent placement  07/1999    "?1"  . Lumbar microdiscectomy  03/2005  . Orif ankle fracture Left 12/25/2014    Procedure: OPEN REDUCTION INTERNAL FIXATION (ORIF) LEFT TRIMALLEOLAR ANKLE FRACTURE;  Surgeon: Tarry Kos, MD;  Location: [facility] OR;  Service: Orthopedics;  Laterality: Left;       HPI  from the history and physical done on the day of admission:   Beth Payne is a 74 y.o. female with a  history of DM-1 S/P Insulin Pump, CAD and recent ORIF Left Ankle who presents to the ED with complaints of lethargy and increasing confusion along with fevers and chills and increased blood sugars. She was evaluated in the ED and was found to have a glucose level of 590 and was found to have DKA. She was placed on the DKA protocol and a Sepsis workup was also initiated. She was placed on empiric IV Vancomycin and Zosyn.      Hospital Course:     DKA in a DM2 patient.  - A1c 8.1 which I think is acceptable at her age, DKA resolved after IV fluids and IV insulin. She uses insulin pump at home but she is not sure anything about her pump functioning and monitoring. There is a caregiver at home who adjusts her insulin pump. After confirmation with primary care physician it was found that patient was recently asked to stop using her pump for probably the same concern. Her CBGs are well  controlled on Lantus 12 units along with low-dose sliding scale which will be continued upon discharge. She has recently been switched to this regimen by her primary care physician.  We will request home health services upon discharge as well. He shouldn't is eager to go home. Currently completely symptom free without any headache, no chest pain shortness of breath. Denies any cough fever chills or diarrhea. No focal weakness.  CBG (last 3)   Recent Labs  02/26/15 2151 02/27/15 0619 02/27/15 1132  GLUCAP 162* 115* 193*    SIRS - Patient presented with fever, leukocytosis, elevated lactic acid and tachypnea, could have had viral syndrome. She subsequently had negative UA, chest x-ray, cultures remain negative. She was initially on broad-spectrum antibiotic switch were stopped on 02/25/2015. She is stable now.   Hypertension - Initially uncontrolled, improved after resuming home medication, admitted when necessary hydralazine   Acute encephalopathy - Most likely metabolic related to her DKA and SIRS, continue to monitor clinically, appears to be having mild baseline dementia. - Speech following currently on dysphagia 2 diet, continue supportive care evaluate for placement.   Acute renal failure - Resolved, back to baseline - Continue with IV fluids   History of coronary artery disease - Continue with aspirin and statin   Hyperlipidemia - Continue with statin   Dementia - Continue with Aricept   Hypokalemia - Repleted, continue to monitor and replace in the outpatient setting as needed.       Discharge Condition: Fair  Follow UP  Follow-up Information    Follow up with [name], MD In 2 days.   Specialty:  Endocrinology   Why:  call for appt (left detailed message  woth office-[facility])   Contact information:   [address] [place] [address] [phone]       Follow up with [facility].   Why:  resume HH services  (RN/PT/aide/CSW)   Contact information:   [address] [place] [address] [phone]        Consults obtained - diabetic educator  Diet and Activity recommendation: See Discharge Instructions below  Discharge Instructions         Discharge Instructions    Discharge instructions    Complete by:  As directed   Follow with Primary MD [name], MD in 2-3 days   Get CBC, CMP, 2 view Chest X ray checked  by Primary MD next visit.    Activity: As tolerated with Full fall precautions use  walker/cane & assistance as needed   Disposition SNF    Diet: Dysphagia 2 low carbohydrate diet with feeding assistance and full aspiration precautions.  For Heart failure patients - Check your Weight same time everyday, if you gain over 2 pounds, or you develop in leg swelling, experience more shortness of breath or chest pain, call your Primary MD immediately. Follow Cardiac Low Salt Diet and 1.5 lit/day fluid restriction.   On your next visit with your primary care physician please Get Medicines reviewed and adjusted.   Please request your Prim.MD to go over all Hospital Tests and Procedure/Radiological results at the follow up, please get all Hospital records sent to your Prim MD by signing hospital release before you go home.   If you experience worsening of your admission symptoms, develop shortness of breath, life threatening emergency, suicidal or homicidal thoughts you must seek medical attention immediately by calling 911 or calling your MD immediately  if symptoms less severe.  You Must read complete instructions/literature along with all the possible adverse reactions/side effects for all the Medicines you take and that have been prescribed to you. Take any new Medicines after you have completely understood and accpet all the possible adverse reactions/side effects.   Do not drive, operating heavy machinery, perform activities at heights, swimming or  participation in water activities or provide baby sitting services if your were admitted for syncope or siezures until you have seen by Primary MD or a Neurologist and advised to do so again.  Do not drive when taking Pain medications.    Do not take more than prescribed Pain, Sleep and Anxiety Medications  Special Instructions: If you have smoked or chewed Tobacco  in the last 2 yrs please stop smoking, stop any regular Alcohol  and or any Recreational drug use.  Wear Seat belts while driving.   Please note  You were cared for by a hospitalist during your hospital stay. If you have any questions about your discharge medications or the care you received while you were in the hospital after you are discharged, you can call the unit and asked to speak with the hospitalist on call if the hospitalist that took care of you is not available. Once you are discharged, your primary care physician will handle any further medical issues. Please note that NO REFILLS for any discharge medications will be authorized once you are discharged, as it is imperative that you return to your primary care physician (or establish a relationship with a primary care physician if you do not have one) for your aftercare needs so that they can reassess your need for medications and monitor your lab values.     Increase activity slowly    Complete by:  As directed              Discharge Medications       Medication List    STOP taking these medications        insulin pump Soln      TAKE these medications        acetaminophen 325 MG tablet  Commonly known as:  TYLENOL  Take 2 tablets (650 mg total) by mouth every 6 (six) hours as needed for mild pain (or Fever >/= 101).     amLODipine 5 MG tablet  Commonly known as:  NORVASC  Take 1 tablet (5 mg total) by mouth daily.     ARIPiprazole 5 MG tablet  Commonly known as:  ABILIFY  Take 5 mg  by mouth daily.     ARIPiprazole 2 MG tablet  Commonly known  as:  ABILIFY  Take 2 mg by mouth at bedtime.     aspirin 325 MG EC tablet  Take 1 tablet (325 mg total) by mouth 2 (two) times daily. For 6 weeks, then daily     atorvastatin 20 MG tablet  Commonly known as:  LIPITOR  Take 20 mg by mouth daily.     cholecalciferol 1000 UNITS tablet  Commonly known as:  VITAMIN D  Take 2,000 Units by mouth daily.     docusate sodium 100 MG capsule  Commonly known as:  COLACE  Take 100 mg by mouth 2 (two) times daily.     donepezil 10 MG tablet  Commonly known as:  ARICEPT  Take 10 mg by mouth at bedtime.     ferrous sulfate 325 (65 FE) MG tablet  Take 325 mg by mouth 2 (two) times daily.     insulin aspart 100 UNIT/ML injection  Commonly known as:  NOVOLOG  Before each meal 3 times a day, 140-199 - 2 units, 200-250 - 4 units, 251-299 - 6 units,  300-349 - 8 units,  350 or above 10 units. Dispense syringes and needles as needed, Ok to switch to PEN if approved. Substitute to any brand approved. DX DM2, Code E11.65.   You can continue to use previous supply of Novolog/Humolog at this dose     insulin glargine 100 UNIT/ML injection  Commonly known as:  LANTUS  Inject 0.12 mLs (12 Units total) into the skin at bedtime.     LAMICTAL 100 MG tablet  Generic drug:  lamoTRIgine  Take 50 mg by mouth daily.     LORazepam 0.5 MG tablet  Commonly known as:  ATIVAN  Take 0.5 mg by mouth every 8 (eight) hours. Anxiety     multivitamin with minerals Tabs tablet  Take 1 tablet by mouth daily.     omeprazole 20 MG capsule  Commonly known as:  PRILOSEC  Take 40 mg by mouth daily.     polyethylene glycol packet  Commonly known as:  MIRALAX / GLYCOLAX  Take 17 g by mouth daily as needed for mild constipation.     prazosin 1 MG capsule  Commonly known as:  MINIPRESS  Take 1 mg by mouth daily.     rosuvastatin 10 MG tablet  Commonly known as:  CRESTOR  Take 5 mg by mouth daily.     sertraline 100 MG tablet  Commonly known as:  ZOLOFT  Take 150  mg by mouth daily.        Major procedures and Radiology Reports - PLEASE review detailed and final reports for all details, in brief -       Dg Chest Port 1 View  02/23/2015   CLINICAL DATA:  Weakness. History of coronary artery disease, TIA and diabetes.  EXAM: PORTABLE CHEST - 1 VIEW  COMPARISON:  12/31/2014  FINDINGS: Cardiac silhouette is normal in size and configuration. No mediastinal or hilar masses or evidence of adenopathy. Clear lungs. No pleural effusion or pneumothorax.  Bony thorax is grossly intact.  IMPRESSION: No acute cardiopulmonary disease.   Electronically Signed   By: Amie Portland M.D.   On: 02/23/2015 14:42    Micro Results      Recent Results (from the past 240 hour(s))  Blood Culture (routine x 2)     Status: None (Preliminary result)   Collection Time: 02/23/15  2:00 PM  Result Value Ref Range Status   Specimen Description BLOOD RIGHT ANTECUBITAL  Final   Special Requests BOTTLES DRAWN AEROBIC AND ANAEROBIC 3CC  Final   Culture NO GROWTH 3 DAYS  Final   Report Status PENDING  Incomplete  Blood Culture (routine x 2)     Status: None (Preliminary result)   Collection Time: 02/23/15  2:14 PM  Result Value Ref Range Status   Specimen Description BLOOD RIGHT ANTECUBITAL  Final   Special Requests BOTTLES DRAWN AEROBIC AND ANAEROBIC 5CC  Final   Culture NO GROWTH 3 DAYS  Final   Report Status PENDING  Incomplete  Urine culture     Status: None   Collection Time: 02/23/15  2:33 PM  Result Value Ref Range Status   Specimen Description URINE, CLEAN CATCH  Final   Special Requests NONE  Final   Culture MULTIPLE SPECIES PRESENT, SUGGEST RECOLLECTION  Final   Report Status 02/24/2015 FINAL  Final  MRSA PCR Screening     Status: None   Collection Time: 02/24/15 10:43 AM  Result Value Ref Range Status   MRSA by PCR NEGATIVE NEGATIVE Final    Comment:        The GeneXpert MRSA Assay (FDA approved for NASAL specimens only), is one component of a comprehensive  MRSA colonization surveillance program. It is not intended to diagnose MRSA infection nor to guide or monitor treatment for MRSA infections.        Today   Subjective    Beth Payne today has no headache,no chest abdominal pain,no new weakness tingling or numbness, feels much better wants to go home today.     Objective   Blood pressure 139/61, pulse 75, temperature 97.9 F (36.6 C), temperature source Oral, resp. rate 18, height 5' (1.524 m), weight 48.5 kg (106 lb 14.8 oz), SpO2 100 %.   Intake/Output Summary (Last 24 hours) at 02/27/15 1409 Last data filed at 02/27/15 0730  Gross per 24 hour  Intake    320 ml  Output    200 ml  Net    120 ml    Exam Awake Alert, Oriented x 2, No new F.N deficits, Normal affect [place].AT,PERRAL Supple Neck,No JVD, No cervical lymphadenopathy appriciated.  Symmetrical Chest wall movement, Good air movement bilaterally, CTAB RRR,No Gallops,Rubs or new Murmurs, No Parasternal Heave +ve B.Sounds, Abd Soft, Non tender, No organomegaly appriciated, No rebound -guarding or rigidity. No Cyanosis, Clubbing or edema, No new Rash or bruise   Data Review   CBC w Diff:  Lab Results  Component Value Date   WBC 8.3 02/26/2015   HGB 10.1* 02/26/2015   HCT 30.4* 02/26/2015   PLT 217 02/26/2015   LYMPHOPCT 7 02/23/2015   MONOPCT 11 02/23/2015   EOSPCT 0 02/23/2015   BASOPCT 0 02/23/2015    CMP:  Lab Results  Component Value Date   NA 136 02/26/2015   K 3.6 02/26/2015   CL 108 02/26/2015   CO2 23 02/26/2015   BUN <5* 02/26/2015   CREATININE 0.57 02/26/2015   PROT 5.7* 02/26/2015   ALBUMIN 3.3* 02/26/2015   BILITOT 0.6 02/26/2015   ALKPHOS 72 02/26/2015   AST 38 02/26/2015   ALT 30 02/26/2015  .   Total Time in preparing paper work, data evaluation and todays exam - 35 minutes  Leroy Sea M.D on 02/27/2015 at 2:09 PM  [facility]   Office  [phone]

## 2015-02-27 NOTE — Evaluation (Signed)
Physical Therapy Evaluation Patient Details Name: Beth Payne MRN: [id number] DOB: [birth year] Today's Date: 02/27/2015   History of Present Illness  Pt is a 74 y.o. female with a history of DM-1 S/P Insulin Pump, CAD and recent ORIF Left Ankle who presents to the ED with complaints of lethargy and increasing confusion along with fevers and chills and increased blood sugars. She was evaluated in the ED and was found to have a glucose level of 590 and was found to have DKA.She was placed on the DKA protocol and a Sepsis workup was also initiated.   Clinical Impression  Pt admitted with above diagnosis. Pt currently with functional limitations due to the deficits listed below (see PT Problem List). At the time of PT eval pt was able to perform transfers and ambulation with min assist for balance and support. Pt admits she is confused when PT enters, and does not understand that this is a separate hospital admission from when she previously broke her ankle. Therapist attempted to redirect pt to current situation. Pt was reporting inconsistent details regarding home situation and PLOF. It appears that pt will not have consistent support at home, and that she was only home 3 days from SNF prior to returning to the hospital. At this time I feel the safest discharge disposition for pt is for continued therapy at the SNF level. Pt will benefit from acute skilled PT to increase their independence and safety with mobility to allow discharge to the venue listed below.       Follow Up Recommendations SNF;Supervision/Assistance - 24 hour    Equipment Recommendations  None recommended by PT    Recommendations for Other Services       Precautions / Restrictions Precautions Precautions: Fall Restrictions Other Position/Activity Restrictions: Prior ORIF of ankle. Boot donned prior to OOB. Initially states she can walk with the boot and then once in chair, pt states she is NWB on the L side. Unclear  what her current weight bearing status is.       Mobility  Bed Mobility Overal bed mobility: Needs Assistance Bed Mobility: Supine to Sit     Supine to sit: Supervision     General bed mobility comments: Increased time and use of bed rails to transition to full sitting position and scoot out to EOB.   Transfers Overall transfer level: Needs assistance Equipment used: Rolling walker (2 wheeled);None Transfers: Sit to/from [employer] to Stand: Min assist Stand pivot transfers: Min assist       General transfer comment: Assist to power-up to full standing position and pivot over to the chair. Pt refused to use the RW for SPT bed>BSC, and wanted to show therapist how she transfers at home. Pt reaching and pulling on the [facility] unsafely and required assist to prevent a fall.   Ambulation/Gait Ambulation/Gait assistance: Min guard Ambulation Distance (Feet): 15 Feet Assistive device: Rolling walker (2 wheeled) Gait Pattern/deviations: Step-through pattern;Decreased stride length;Trunk flexed Gait velocity: Decreased Gait velocity interpretation: Below normal speed for age/gender General Gait Details: Pt ambulating with boot donned on LLE. Many short steps taken to get around the bed to the chair. Increased cueing for safety and hands-on guarding required.   Stairs            Wheelchair Mobility    Modified Rankin (Stroke Patients Only)       Balance Overall balance assessment: Needs assistance Sitting-balance support: Feet supported;No upper extremity supported Sitting balance-Leahy Scale: Fair  Standing balance support: Bilateral upper extremity supported;During functional activity Standing balance-Leahy Scale: Poor                               Pertinent Vitals/Pain Pain Assessment: No/denies pain    Home Living Family/patient expects to be discharged to:: Private residence Living Arrangements: Alone Available Help at  Discharge: Family;Personal care attendant;Available PRN/intermittently Type of Home: House Home Access: Stairs to enter Entrance Stairs-Rails: Left;Right;Can reach both Entrance Stairs-Number of Steps: 6 Home Layout: One level Home Equipment: Shower seat;Bedside commode;Walker - 2 wheels;Walker - 4 wheels Additional Comments: Some home info was supplemented from prior admission. Pt was giving inconsistent answers during interview.     Prior Function Level of Independence: Needs assistance   Gait / Transfers Assistance Needed: Pt initially states she uses a 2 wheeled walker, then states she uses the wheelchair, and when asked again she states she uses a 4WW.   ADL's / Homemaking Assistance Needed: Pt states aide is assisting with bathing but she can dress herself.         Hand Dominance   Dominant Hand: Right    Extremity/Trunk Assessment   Upper Extremity Assessment: Defer to OT evaluation;Overall [facility] for tasks assessed           Lower Extremity Assessment: LLE deficits/detail   LLE Deficits / Details: Decreased strength and AROM consistent with prior ORIF of ankle.   Cervical / Trunk Assessment: Kyphotic  Communication   Communication: No difficulties  Cognition Arousal/Alertness: Awake/alert Behavior During Therapy: WFL for tasks assessed/performed Overall Cognitive Status: History of cognitive impairments - at baseline       Memory: Decreased short-term memory;Decreased recall of precautions (Initially states can walk with the boot on, then states NWB)              General Comments      Exercises        Assessment/Plan    PT Assessment Patient needs continued PT services  PT Diagnosis Difficulty walking;Generalized weakness   PT Problem List Decreased strength;Decreased range of motion;Decreased activity tolerance;Decreased balance;Decreased mobility;Decreased knowledge of use of DME;Decreased safety awareness;Decreased knowledge of precautions  PT  Treatment Interventions DME instruction;Gait training;Stair training;Functional mobility training;Therapeutic activities;Therapeutic exercise;Neuromuscular re-education;Patient/family education   PT Goals (Current goals can be found in the Care Plan section) Acute Rehab PT Goals Patient Stated Goal: Return home PT Goal Formulation: With patient Time For Goal Achievement: 03/06/15 Potential to Achieve Goals: Good    Frequency Min 3X/week   Barriers to discharge Decreased caregiver support Inconsistent reports of assistance available at home. Last report this therapist got was intermittent assist from family and a CNA. It was reported that pt is alone at times.    Co-evaluation               End of Session Equipment Utilized During Treatment: Gait belt Activity Tolerance: Patient tolerated treatment well Patient left: in chair;with call bell/phone within reach;with chair alarm set Nurse Communication: Mobility status         Time: 1014-1050 PT Time Calculation (min) (ACUTE ONLY): 36 min   Charges:   PT Evaluation $Initial PT Evaluation Tier I: 1 Procedure PT Treatments $Gait Training: 8-22 mins   PT G Codes:        Conni Slipper 03-19-2015, 11:07 AM   Conni Slipper, PT, DPT Acute Rehabilitation Services Pager: [phone]

## 2015-02-27 NOTE — Discharge Instructions (Signed)
Follow with Primary MD [name], MD in 2-3 days   Get CBC, CMP, 2 view Chest X ray checked  by Primary MD next visit.    Activity: As tolerated with Full fall precautions use walker/cane & assistance as needed   Disposition SNF    Diet: Dysphagia 2 low carbohydrate diet with feeding assistance and full aspiration precautions.  For Heart failure patients - Check your Weight same time everyday, if you gain over 2 pounds, or you develop in leg swelling, experience more shortness of breath or chest pain, call your Primary MD immediately. Follow Cardiac Low Salt Diet and 1.5 lit/day fluid restriction.   On your next visit with your primary care physician please Get Medicines reviewed and adjusted.   Please request your Prim.MD to go over all Hospital Tests and Procedure/Radiological results at the follow up, please get all Hospital records sent to your Prim MD by signing hospital release before you go home.   If you experience worsening of your admission symptoms, develop shortness of breath, life threatening emergency, suicidal or homicidal thoughts you must seek medical attention immediately by calling 911 or calling your MD immediately  if symptoms less severe.  You Must read complete instructions/literature along with all the possible adverse reactions/side effects for all the Medicines you take and that have been prescribed to you. Take any new Medicines after you have completely understood and accpet all the possible adverse reactions/side effects.   Do not drive, operating heavy machinery, perform activities at heights, swimming or participation in water activities or provide baby sitting services if your were admitted for syncope or siezures until you have seen by Primary MD or a Neurologist and advised to do so again.  Do not drive when taking Pain medications.    Do not take more than prescribed Pain, Sleep and Anxiety Medications  Special Instructions: If you have  smoked or chewed Tobacco  in the last 2 yrs please stop smoking, stop any regular Alcohol  and or any Recreational drug use.  Wear Seat belts while driving.   Please note  You were cared for by a hospitalist during your hospital stay. If you have any questions about your discharge medications or the care you received while you were in the hospital after you are discharged, you can call the unit and asked to speak with the hospitalist on call if the hospitalist that took care of you is not available. Once you are discharged, your primary care physician will handle any further medical issues. Please note that NO REFILLS for any discharge medications will be authorized once you are discharged, as it is imperative that you return to your primary care physician (or establish a relationship with a primary care physician if you do not have one) for your aftercare needs so that they can reassess your need for medications and monitor your lab values.

## 2015-02-27 NOTE — Care Management Note (Signed)
Case Management Note CM note started by Junius Creamer RNCM  Patient Details  Name: Beth Payne MRN: [id number] Date of Birth: 1940/08/04  Subjective/Objective:       Adm w sirs             Action/Plan:lives at home, pcp dr Laurene Footman, [facility] w [facility] for h[facility]pt and hhot.   Expected Discharge Date:     02/27/15             Expected Discharge Plan:  Home w Home Health Services  In-House Referral:  Clinical Social Work  Discharge planning Services  CM Consult  Post Acute Care Choice:  Resumption of Svcs/PTA Provider Choice offered to:  Patient  DME Arranged:    DME Agency:     HH Arranged:  PT, OT, RN, Nurse's Aide, Social Work [employer] Agency:  [facility]  Status of Service:  Completed, signed off  Medicare Important Message Given:  Yes-second notification given Date Medicare IM Given:    Medicare IM give by:    Date Additional Medicare IM Given:    Additional Medicare Important Message give by:     If discussed at Long Length of Stay Meetings, dates discussed:    Additional Comments: confused at present. Will make sw ref in case needs snf .  02/27/15- Donn Pierini RN, BSN - pt for d/c today, spoke with pt at bedside- she reports that she has [facility] services with [facility]- and also has friend who comes to help her- she also states that she has someone to come do some housework. HH orders placed to resume services- call made to Tiffany with [facility] regarding pt's discharge today and resumption of HH services- pt reports that she has needed DME at home- pt wants her friend Beth Payne called to come pick her up - bedside RN to f/u regarding calling friend.   Darrold Span, RN 02/27/2015, 9:50 AM

## 2015-02-27 NOTE — Progress Notes (Signed)
Pt discharged to SNF.  Alert and oriented x3.  No c/o pain.  Education given on diet, activity, meds, and follow-up care and appointments.  Pt and family verbalized understanding.  Pt will be going to [facility], Family aware of this plan.  IV discontinued.  Tele D/Cd.  Pt transported by family member with discharge packet.  Belongings sent with patient.

## 2015-02-28 ENCOUNTER — Encounter: Payer: Self-pay | Admitting: Adult Health

## 2015-02-28 ENCOUNTER — Non-Acute Institutional Stay (SKILLED_NURSING_FACILITY): Payer: Medicare Other | Admitting: Adult Health

## 2015-02-28 DIAGNOSIS — F0390 Unspecified dementia without behavioral disturbance: Secondary | ICD-10-CM | POA: Diagnosis not present

## 2015-02-28 DIAGNOSIS — E1010 Type 1 diabetes mellitus with ketoacidosis without coma: Secondary | ICD-10-CM | POA: Diagnosis not present

## 2015-02-28 DIAGNOSIS — K219 Gastro-esophageal reflux disease without esophagitis: Secondary | ICD-10-CM

## 2015-02-28 DIAGNOSIS — F419 Anxiety disorder, unspecified: Secondary | ICD-10-CM

## 2015-02-28 DIAGNOSIS — R5381 Other malaise: Secondary | ICD-10-CM | POA: Diagnosis not present

## 2015-02-28 DIAGNOSIS — E785 Hyperlipidemia, unspecified: Secondary | ICD-10-CM

## 2015-02-28 DIAGNOSIS — F322 Major depressive disorder, single episode, severe without psychotic features: Secondary | ICD-10-CM | POA: Diagnosis not present

## 2015-02-28 DIAGNOSIS — I10 Essential (primary) hypertension: Secondary | ICD-10-CM

## 2015-02-28 DIAGNOSIS — D509 Iron deficiency anemia, unspecified: Secondary | ICD-10-CM | POA: Diagnosis not present

## 2015-02-28 DIAGNOSIS — F329 Major depressive disorder, single episode, unspecified: Secondary | ICD-10-CM

## 2015-02-28 DIAGNOSIS — S82892A Other fracture of left lower leg, initial encounter for closed fracture: Secondary | ICD-10-CM | POA: Diagnosis not present

## 2015-02-28 DIAGNOSIS — K5900 Constipation, unspecified: Secondary | ICD-10-CM

## 2015-02-28 LAB — CBC AND DIFFERENTIAL
HCT: 30 % — AB (ref 36–46)
HEMOGLOBIN: 9.3 g/dL — AB (ref 12.0–16.0)
Platelets: 300 10*3/uL (ref 150–399)
WBC: 5.1 10*3/mL

## 2015-02-28 LAB — CULTURE, BLOOD (ROUTINE X 2)
CULTURE: NO GROWTH
Culture: NO GROWTH

## 2015-02-28 LAB — BASIC METABOLIC PANEL
BUN: 12 mg/dL (ref 4–21)
CREATININE: 0.7 mg/dL (ref 0.5–1.1)
GLUCOSE: 85 mg/dL
POTASSIUM: 4.1 mmol/L (ref 3.4–5.3)
Sodium: 140 mmol/L (ref 137–147)

## 2015-02-28 NOTE — Care Management Note (Signed)
Case Management Note CM note started by Junius Creamer RNCM  Patient Details  Name: Beth Payne MRN: [id number] Date of Birth: 12/25/1940  Subjective/Objective:       Adm w sirs             Action/Plan:lives at home, pcp dr Laurene Footman, [facility] w [facility] and hhot.   Expected Discharge Date:     02/27/15             Expected Discharge Plan:  Home w Home Health Services  In-House Referral:  Clinical Social Work  Discharge planning Services  CM Consult  Post Acute Care Choice:  Resumption of Svcs/PTA Provider Choice offered to:  Patient  DME Arranged:    DME Agency:     HH Arranged:  PT, OT, RN, Nurse's Aide, Social Work [employer] Agency:  [facility]  Status of Service:  Completed, signed off  Medicare Important Message Given:  Yes-second notification given Date Medicare IM Given:    Medicare IM give by:    Date Additional Medicare IM Given:    Additional Medicare Important Message give by:     If discussed at Long Length of Stay Meetings, dates discussed:    Additional Comments: confused at present. Will make sw ref in case needs snf .   02/27/15- Donn Pierini RN, BSN - pt for d/c today, spoke with pt at bedside- she reports that she has [facility] services with [facility]- and also has friend who comes to help her- she also states that she has someone to come do some housework. HH orders placed to resume services- call made to Tiffany with [facility] regarding pt's discharge today and resumption of HH services- pt reports that she has needed DME at home- pt wants her friend Ivette Loyal called to come pick her up - bedside RN to f/u regarding calling friend.  Update- 1300- following PT eval recommendation for return to SNF-pt agreeable- CSW called and to see if [facility] can take pt back- per CSW pt can return to [place] today- plan now for pt to return to [facility]-  Have notified [facility] of updated plan to now return to SNF.   Darrold Span, RN 02/28/2015, 10:31 AM

## 2015-02-28 NOTE — Progress Notes (Signed)
Patient ID: Beth Payne, female   DOB: 1940/08/11, 74 y.o.   MRN: [id number]    DATE:  02/28/2015 MRN:  [id number]  BIRTHDAY: 07-10-1940  Facility:  Nursing Home Location:  [facility]  Nursing Home Room Number: 1204-P  LEVEL OF CARE:  SNF (31)  Contact Information    Name Relation Home Work [place] Friend [phone]  [phone]   No name specified       Juliann Mule [phone]        Chief Complaint  Patient presents with  . Hospitalization Follow-up    Physical deconditioning, DKA without coma, depression, ankle fracture S/P ORIF, hyperlipidemia, hypertension, constipation, GERD, anxiety, anemia and dementia    HISTORY OF PRESENT ILLNESS:  This is a 74 year old female who was been admitted to [facility] on 12/27/14 from [facility]. She has PMH of diabetes mellitus type 1, S/P insulin pump, CAD and recent ORIF of left ankle fracture. She was lethargy, increasing confusion, fever, chills and elevated sugar. She had CABG of 590 and found to have DKA. She was using her insulin pump but was discontinued from the last hospitalization due to same problem.  The fever, leukocytosis, elevated lactic acid and tachypnea could have been due to a viral syndrome. She was initially put on broad-spectrum antibiotics and stopped since UA, chest x-ray and cultures remain negative.  She has been admitted for a short-term rehabilitation.   PAST MEDICAL HISTORY:  Past Medical History  Diagnosis Date  . Coronary artery disease   . Depression   . Retinopathy   . Anxiety   . Heart murmur   . TIA (transient ischemic attack)   . Arthritis     "fingers" (12/23/2014)  . Fibromyalgia   . Type I diabetes mellitus dx'd 1953    "I'm on a pup" (12/23/2014)  . Chronic lower back pain   . GERD (gastroesophageal reflux disease) dx'd 1995  . Osteopenia dx'd 2007     CURRENT MEDICATIONS: Reviewed  Patient's Medications  New Prescriptions   No  medications on file  Previous Medications   ACETAMINOPHEN (TYLENOL) 325 MG TABLET    Take 2 tablets (650 mg total) by mouth every 6 (six) hours as needed for mild pain (or Fever >/= 101).   AMLODIPINE (NORVASC) 5 MG TABLET    Take 1 tablet (5 mg total) by mouth daily.   ARIPIPRAZOLE (ABILIFY) 2 MG TABLET    Take 2 mg by mouth at bedtime.    ARIPIPRAZOLE (ABILIFY) 5 MG TABLET    Take 5 mg by mouth daily.   ASPIRIN 325 MG EC TABLET    Take 1 tablet (325 mg total) by mouth 2 (two) times daily. For 6 weeks, then daily   ATORVASTATIN (LIPITOR) 20 MG TABLET    Take 20 mg by mouth daily.   CHOLECALCIFEROL (VITAMIN D) 1000 UNITS TABLET    Take 2,000 Units by mouth daily.   DOCUSATE SODIUM (COLACE) 100 MG CAPSULE    Take 100 mg by mouth 2 (two) times daily.   DONEPEZIL (ARICEPT) 10 MG TABLET    Take 10 mg by mouth at bedtime.   FERROUS SULFATE 325 (65 FE) MG TABLET    Take 325 mg by mouth 2 (two) times daily.   INSULIN ASPART (NOVOLOG) 100 UNIT/ML INJECTION    Before each meal 3 times a day, 140-199 - 2 units, 200-250 - 4 units, 251-299 - 6 units,  300-349 - 8 units,  350 or above 10 units. Dispense syringes and needles as needed, Ok to switch to PEN if approved. Substitute to any brand approved. DX DM2, Code E11.65.   You can continue to use previous supply of Novolog/Humolog at this dose   INSULIN GLARGINE (LANTUS) 100 UNIT/ML INJECTION    Inject 0.12 mLs (12 Units total) into the skin at bedtime.   LAMOTRIGINE (LAMICTAL) 100 MG TABLET    Take 50 mg by mouth daily.   LORAZEPAM (ATIVAN) 0.5 MG TABLET    Take 0.5 mg by mouth every 8 (eight) hours. Anxiety   MULTIPLE VITAMIN (MULITIVITAMIN WITH MINERALS) TABS    Take 1 tablet by mouth daily.   OMEPRAZOLE (PRILOSEC) 20 MG CAPSULE    Take 40 mg by mouth daily.   POLYETHYLENE GLYCOL (MIRALAX / GLYCOLAX) PACKET    Take 17 g by mouth daily as needed for mild constipation.   PRAZOSIN (MINIPRESS) 1 MG CAPSULE    Take 1 mg by mouth daily.   SERTRALINE (ZOLOFT)  100 MG TABLET    Take 150 mg by mouth daily.  Modified Medications   No medications on file  Discontinued Medications   ROSUVASTATIN (CRESTOR) 10 MG TABLET    Take 5 mg by mouth daily.     Allergies  Allergen Reactions  . Diclofenac Sodium Swelling and Other (See Comments)    Blurred vision  . Doxycycline Calcium Swelling and Other (See Comments)    Blurred vision  . Codeine Nausea And Vomiting and Other (See Comments)    Dizziness   . Zocor [Simvastatin] Other (See Comments)    Dizziness, weakness      REVIEW OF SYSTEMS:  GENERAL: no change in appetite, no fatigue, no weight changes, no fever, chills or weakness EYES: Denies change in vision, dry eyes, eye pain, itching or discharge EARS: Denies change in hearing, ringing in ears, or earache NOSE: Denies nasal congestion or epistaxis MOUTH and THROAT: Denies oral discomfort, gingival pain or bleeding, pain from teeth or hoarseness   RESPIRATORY: no cough, SOB, DOE, wheezing, hemoptysis CARDIAC: no chest pain, edema or palpitations GI: no abdominal pain, diarrhea, constipation, heart burn, nausea or vomiting GU: Denies dysuria, frequency, hematuria, incontinence, or discharge PSYCHIATRIC: Denies feeling of depression or anxiety. No report of hallucinations, insomnia, paranoia, or agitation   PHYSICAL EXAMINATION  GENERAL APPEARANCE: Well nourished. In no acute distress. Normal body habitus HEAD: Normal in size and contour. No evidence of trauma EYES: Lids open and close normally. No blepharitis, entropion or ectropion. PERRL. Conjunctivae are clear and sclerae are white. Lenses are without opacity EARS: Pinnae are normal. Patient hears normal voice tunes of the examiner MOUTH and THROAT: Lips are without lesions. Oral mucosa is moist and without lesions. Tongue is normal in shape, size, and color and without lesions NECK: supple, trachea midline, no neck masses, no thyroid tenderness, no thyromegaly LYMPHATICS: no LAN in  the neck, no supraclavicular LAN RESPIRATORY: breathing is even & unlabored, BS CTAB CARDIAC: RRR, no murmur,no extra heart sounds, no edema GI: abdomen soft, normal BS, no masses, no tenderness, no hepatomegaly, no splenomegaly EXTREMITIES:  Able to move 4 extremities PSYCHIATRIC: Alert and oriented X 3. Affect and behavior are appropriate  LABS/RADIOLOGY: Labs reviewed: Basic Metabolic Panel:  Recent Labs  84/69/62 1320  02/25/15 0041 02/25/15 0307 02/26/15 0242 02/27/15 0535  NA 135  < > 139 137 136  --   K 5.0  < > 3.0* 3.0* 3.6  --   CL 103  < >  110 107 108  --   CO2 14*  < > 21* 19* 23  --   GLUCOSE 560*  < > 157* 173* 103*  --   BUN 27*  < > 8 8 <5*  --   CREATININE 1.72*  < > 0.72 0.78 0.57  --   CALCIUM 9.6  < > 8.8* 8.7* 9.1  --   MG 1.9  < >  --  1.8 1.5* 2.0  PHOS 4.0  --   --   --  1.1*  --   < > = values in this interval not displayed. Liver Function Tests:  Recent Labs  12/31/14 2052 02/23/15 1427 02/26/15 0242  AST 29 31 38  ALT 15 24 30   ALKPHOS 70 87 72  BILITOT 0.4 0.9 0.6  PROT 5.9* 6.9 5.7*  ALBUMIN 3.1* 4.0 3.3*    Recent Labs  12/12/14 1010  LIPASE 21*    Recent Labs  12/31/14 2056  AMMONIA 17   CBC:  Recent Labs  12/12/14 1010  12/23/14 1042  02/23/15 1428 02/24/15 0527 02/25/15 0307 02/26/15 0242  WBC 7.5  < > 7.8  < > 20.3* 17.7* 12.0* 8.3  NEUTROABS 6.2  --  6.2  --  16.6*  --   --   --   HGB 10.4*  < > 10.6*  < > 10.2* 8.7* 9.1* 10.1*  HCT 32.1*  < > 32.2*  < > 31.6* 26.0* 27.2* 30.4*  MCV 85.4  < > 83.6  < > 85.2 82.8 82.2 83.1  PLT 196  < > 304  < > 288 222 220 217  < > = values in this interval not displayed.  Cardiac Enzymes:  Recent Labs  12/13/14 1309 12/13/14 1827 12/14/14 0007  TROPONINI 0.85* 0.79* 0.67*    CBG:  Recent Labs  02/26/15 2151 02/27/15 0619 02/27/15 1132  GLUCAP 162* 115* 193*     Dg Chest Port 1 View  02/23/2015   CLINICAL DATA:  Weakness. History of coronary artery  disease, TIA and diabetes.  EXAM: PORTABLE CHEST - 1 VIEW  COMPARISON:  12/31/2014  FINDINGS: Cardiac silhouette is normal in size and configuration. No mediastinal or hilar masses or evidence of adenopathy. Clear lungs. No pleural effusion or pneumothorax.  Bony thorax is grossly intact.  IMPRESSION: No acute cardiopulmonary disease.   Electronically Signed   By: Amie Portland M.D.   On: 02/23/2015 14:42    ASSESSMENT/PLAN:  Physical deconditioning - for rehabilitation  DKA without coma - hemoglobin A1c 8.1; continue Lantus 12 units subcutaneous every morning and Humalog sliding scale SQ ACHS  Depression - mood is stable; continue Zoloft 50 mg+ 100 mg = 150 mg by mouth daily, Lamictal 50 mg by mouth daily and Abilify 5 mg by mouth every morning and 2 mg by mouth daily at bedtime  Ankle fracture S/P ORIF - for for rehabilitation; continue aspirin 325 mg 1 tab by mouth daily for DVT prophylaxis  Hyperlipidemia - continue atorvastatin 20 mg by mouth daily  Hypertension - well controlled; continue Minipress 1 mg 1 capsule by mouth daily and amlodipine 5 mg 1 tab by mouth daily  Constipation - continue MiraLAX 17 g by mouth daily when necessary and Colace 100 mg 1 capsule by mouth daily  GERD - continue omeprazole 40 mg 1 capsule by mouth daily  Anxiety - mood is stable; continue Ativan 0.5 mg 1 tab by mouth every 8 hours  Anemia, iron deficiency - hemoglobin 10.1;  continue ferrous sulfate 325 mg 1 tab by mouth twice a day  Dementia - continue Aricept 10 mg 1 tab by mouth daily at bedtime     Goals of care:  Short-term rehabilitation     [facility], NP [facility] [phone]

## 2015-03-04 LAB — GLUCOSE, CAPILLARY: Glucose-Capillary: 180 mg/dL — ABNORMAL HIGH (ref 65–99)

## 2015-03-06 DIAGNOSIS — S82852D Displaced trimalleolar fracture of left lower leg, subsequent encounter for closed fracture with routine healing: Secondary | ICD-10-CM | POA: Diagnosis not present

## 2015-03-19 DIAGNOSIS — I2510 Atherosclerotic heart disease of native coronary artery without angina pectoris: Secondary | ICD-10-CM | POA: Diagnosis not present

## 2015-03-19 DIAGNOSIS — F329 Major depressive disorder, single episode, unspecified: Secondary | ICD-10-CM | POA: Diagnosis not present

## 2015-03-19 DIAGNOSIS — E785 Hyperlipidemia, unspecified: Secondary | ICD-10-CM | POA: Diagnosis not present

## 2015-03-19 DIAGNOSIS — E1142 Type 2 diabetes mellitus with diabetic polyneuropathy: Secondary | ICD-10-CM | POA: Diagnosis not present

## 2015-03-19 DIAGNOSIS — M810 Age-related osteoporosis without current pathological fracture: Secondary | ICD-10-CM | POA: Diagnosis not present

## 2015-03-19 DIAGNOSIS — D649 Anemia, unspecified: Secondary | ICD-10-CM | POA: Diagnosis not present

## 2015-03-19 DIAGNOSIS — R413 Other amnesia: Secondary | ICD-10-CM | POA: Diagnosis not present

## 2015-03-19 DIAGNOSIS — Z681 Body mass index (BMI) 19 or less, adult: Secondary | ICD-10-CM | POA: Diagnosis not present

## 2015-03-19 DIAGNOSIS — E10319 Type 1 diabetes mellitus with unspecified diabetic retinopathy without macular edema: Secondary | ICD-10-CM | POA: Diagnosis not present

## 2015-03-19 DIAGNOSIS — M14679 Charcot's joint, unspecified ankle and foot: Secondary | ICD-10-CM | POA: Diagnosis not present

## 2015-03-24 ENCOUNTER — Non-Acute Institutional Stay (SKILLED_NURSING_FACILITY): Payer: Medicare Other | Admitting: Adult Health

## 2015-03-24 ENCOUNTER — Encounter: Payer: Self-pay | Admitting: Adult Health

## 2015-03-24 DIAGNOSIS — K219 Gastro-esophageal reflux disease without esophagitis: Secondary | ICD-10-CM

## 2015-03-24 DIAGNOSIS — E1010 Type 1 diabetes mellitus with ketoacidosis without coma: Secondary | ICD-10-CM

## 2015-03-24 DIAGNOSIS — F419 Anxiety disorder, unspecified: Secondary | ICD-10-CM

## 2015-03-24 DIAGNOSIS — I10 Essential (primary) hypertension: Secondary | ICD-10-CM

## 2015-03-24 DIAGNOSIS — S82892A Other fracture of left lower leg, initial encounter for closed fracture: Secondary | ICD-10-CM | POA: Diagnosis not present

## 2015-03-24 DIAGNOSIS — K5900 Constipation, unspecified: Secondary | ICD-10-CM | POA: Diagnosis not present

## 2015-03-24 DIAGNOSIS — F329 Major depressive disorder, single episode, unspecified: Secondary | ICD-10-CM

## 2015-03-24 DIAGNOSIS — R5381 Other malaise: Secondary | ICD-10-CM | POA: Diagnosis not present

## 2015-03-24 DIAGNOSIS — D509 Iron deficiency anemia, unspecified: Secondary | ICD-10-CM

## 2015-03-24 DIAGNOSIS — F0390 Unspecified dementia without behavioral disturbance: Secondary | ICD-10-CM

## 2015-03-24 DIAGNOSIS — E785 Hyperlipidemia, unspecified: Secondary | ICD-10-CM

## 2015-03-24 NOTE — Progress Notes (Signed)
Patient ID: Beth Payne, female   DOB: [birth year], 74 y.o.   MRN: [id number]    DATE:  03/24/2015   MRN:  [id number]  BIRTHDAY: 07-Dec-1940  Facility:  Nursing Home Location:  [facility]  Nursing Home Room Number: 1204-P  LEVEL OF CARE:  SNF (31)  Contact Information    Name Relation Home Work [place] Friend [phone]  [phone]   No name specified       Juliann Mule [phone]        Chief Complaint  Patient presents with  . Discharge Note    Physical deconditioning, DKA without coma, depression, ankle fracture S/P ORIF, hyperlipidemia, hypertension, constipation, GERD, anxiety, anemia and dementia    HISTORY OF PRESENT ILLNESS:  This is a 74 year old female who is for discharge home with Home health PT, OT and Nursing. She has been admitted to [facility] on 12/27/14 from [facility]. She has PMH of diabetes mellitus type 1, S/P insulin pump, CAD and recent ORIF of left ankle fracture. She was lethargic, increasing confusion, fever, chills and elevated sugar. She had CABG of 590 and found to have DKA. She was using her insulin pump but was discontinued from the last hospitalization due to same problem.  The fever, leukocytosis, elevated lactic acid and tachypnea could have been due to a viral syndrome. She was initially put on broad-spectrum antibiotics and stopped since UA, chest x-ray and cultures remain negative.  Patient was admitted to this facility for short-term rehabilitation after the patient's recent hospitalization.  Patient has completed SNF rehabilitation and therapy has cleared the patient for discharge.   PAST MEDICAL HISTORY:  Past Medical History  Diagnosis Date  . Coronary artery disease   . Depression   . Retinopathy   . Anxiety   . Heart murmur   . TIA (transient ischemic attack)   . Arthritis     "fingers" (12/23/2014)  . Fibromyalgia   . Type I diabetes mellitus (HCC) dx'd 1953    "I'm on a pup"  (12/23/2014)  . Chronic lower back pain   . GERD (gastroesophageal reflux disease) dx'd 1995  . Osteopenia dx'd 2007     CURRENT MEDICATIONS: Reviewed     Medication List       This list is accurate as of: 03/24/15  6:00 PM.  Always use your most recent med list.               acetaminophen 325 MG tablet  Commonly known as:  TYLENOL  Take 2 tablets (650 mg total) by mouth every 6 (six) hours as needed for mild pain (or Fever >/= 101).     amLODipine 5 MG tablet  Commonly known as:  NORVASC  Take 1 tablet (5 mg total) by mouth daily.     ARIPiprazole 5 MG tablet  Commonly known as:  ABILIFY  Take 5 mg by mouth daily.     ARIPiprazole 2 MG tablet  Commonly known as:  ABILIFY  Take 2 mg by mouth at bedtime.     aspirin EC 325 MG tablet  Take 325 mg by mouth daily.     atorvastatin 20 MG tablet  Commonly known as:  LIPITOR  Take 20 mg by mouth daily.     docusate sodium 100 MG capsule  Commonly known as:  COLACE  Take 100 mg by mouth 2 (two) times daily.     donepezil 10 MG tablet  Commonly known as:  ARICEPT  Take 10 mg by mouth at bedtime.     ferrous sulfate 325 (65 FE) MG tablet  Take 325 mg by mouth 2 (two) times daily.     insulin aspart 100 UNIT/ML injection  Commonly known as:  NOVOLOG  Before each meal 3 times a day, 140-199 - 2 units, 200-250 - 4 units, 251-299 - 6 units,  300-349 - 8 units,  350 or above 10 units. Dispense syringes and needles as needed, Ok to switch to PEN if approved. Substitute to any brand approved. DX DM2, Code E11.65.   You can continue to use previous supply of Novolog/Humolog at this dose     insulin glargine 100 UNIT/ML injection  Commonly known as:  LANTUS  Inject 0.12 mLs (12 Units total) into the skin at bedtime.     LAMICTAL 100 MG tablet  Generic drug:  lamoTRIgine  Take 50 mg by mouth daily.     LORazepam 0.5 MG tablet  Commonly known as:  ATIVAN  Take 0.5 mg by mouth 3 (three) times daily as needed. Anxiety       multivitamin with minerals Tabs tablet  Take 1 tablet by mouth daily.     omeprazole 20 MG capsule  Commonly known as:  PRILOSEC  Take 40 mg by mouth daily.     polyethylene glycol packet  Commonly known as:  MIRALAX / GLYCOLAX  Take 17 g by mouth daily as needed for mild constipation.     prazosin 1 MG capsule  Commonly known as:  MINIPRESS  Take 1 mg by mouth daily.     PROCEL Powd  Take 1 scoop by mouth 2 (two) times daily.     sertraline 100 MG tablet  Commonly known as:  ZOLOFT  Take 150 mg by mouth daily.     Vitamin D3 2000 UNITS Tabs  Take by mouth daily. For supplement         Allergies  Allergen Reactions  . Diclofenac Sodium Swelling and Other (See Comments)    Blurred vision  . Doxycycline Calcium Swelling and Other (See Comments)    Blurred vision  . Codeine Nausea And Vomiting and Other (See Comments)    Dizziness   . Zocor [Simvastatin] Other (See Comments)    Dizziness, weakness      REVIEW OF SYSTEMS:  GENERAL: no change in appetite, no fatigue, no weight changes, no fever, chills or weakness EYES: Denies change in vision, dry eyes, eye pain, itching or discharge EARS: Denies change in hearing, ringing in ears, or earache NOSE: Denies nasal congestion or epistaxis MOUTH and THROAT: Denies oral discomfort, gingival pain or bleeding, pain from teeth or hoarseness   RESPIRATORY: no cough, SOB, DOE, wheezing, hemoptysis CARDIAC: no chest pain, edema or palpitations GI: no abdominal pain, diarrhea, constipation, heart burn, nausea or vomiting GU: Denies dysuria, frequency, hematuria, incontinence, or discharge PSYCHIATRIC: Denies feeling of depression or anxiety. No report of hallucinations, insomnia, paranoia, or agitation   PHYSICAL EXAMINATION  GENERAL APPEARANCE: Well nourished. In no acute distress. Normal body habitus HEAD: Normal in size and contour. No evidence of trauma EYES: Lids open and close normally. No blepharitis, entropion or  ectropion. PERRL. Conjunctivae are clear and sclerae are white. Lenses are without opacity EARS: Pinnae are normal. Patient hears normal voice tunes of the examiner MOUTH and THROAT: Lips are without lesions. Oral mucosa is moist and without lesions. Tongue is normal in shape, size, and color and without lesions NECK: supple, trachea  midline, no neck masses, no thyroid tenderness, no thyromegaly LYMPHATICS: no LAN in the neck, no supraclavicular LAN RESPIRATORY: breathing is even & unlabored, BS CTAB CARDIAC: RRR, no murmur,no extra heart sounds, no edema GI: abdomen soft, normal BS, no masses, no tenderness, no hepatomegaly, no splenomegaly EXTREMITIES:  Able to move 4 extremities PSYCHIATRIC: Alert and oriented X 3. Affect and behavior are appropriate  LABS/RADIOLOGY: Labs reviewed: 03/07/15  WBC 5.1 hemoglobin 9.3 hematocrit 29.7 MCV 87.6 platelet 300 sodium 140 potassium 4.1 glucose 85 BUN 12 creatinine 0.69 calcium 9.5 Basic Metabolic Panel:  Recent Labs  45/40/98 1320  02/25/15 0041 02/25/15 0307 02/26/15 0242 02/27/15 0535 02/28/15  NA 135  < > 139 137 136  --  140  K 5.0  < > 3.0* 3.0* 3.6  --  4.1  CL 103  < > 110 107 108  --   --   CO2 14*  < > 21* 19* 23  --   --   GLUCOSE 560*  < > 157* 173* 103*  --   --   BUN 27*  < > 8 8 <5*  --  12  CREATININE 1.72*  < > 0.72 0.78 0.57  --  0.7  CALCIUM 9.6  < > 8.8* 8.7* 9.1  --   --   MG 1.9  < >  --  1.8 1.5* 2.0  --   PHOS 4.0  --   --   --  1.1*  --   --   < > = values in this interval not displayed. Liver Function Tests:  Recent Labs  12/31/14 2052 02/23/15 1427 02/26/15 0242  AST 29 31 38  ALT ALKPHOS 70 87 72  BILITOT 0.4 0.9 0.6  PROT 5.9* 6.9 5.7*  ALBUMIN 3.1* 4.0 3.3*    Recent Labs  12/12/14 1010  LIPASE 21*    Recent Labs  12/31/14 2056  AMMONIA 17   CBC:  Recent Labs  12/12/14 1010  12/23/14 1042  02/23/15 1428 02/24/15 0527 02/25/15 0307 02/26/15 0242 02/28/15  WBC 7.5  <  > 7.8  < > 20.3* 17.7* 12.0* 8.3 5.1  NEUTROABS 6.2  --  6.2  --  16.6*  --   --   --   --   HGB 10.4*  < > 10.6*  < > 10.2* 8.7* 9.1* 10.1* 9.3*  HCT 32.1*  < > 32.2*  < > 31.6* 26.0* 27.2* 30.4* 30*  MCV 85.4  < > 83.6  < > 85.2 82.8 82.2 83.1  --   PLT 196  < > 304  < > 288 222 220 217 300  < > = values in this interval not displayed.  Cardiac Enzymes:  Recent Labs  12/13/14 1309 12/13/14 1827 12/14/14 0007  TROPONINI 0.85* 0.79* 0.67*    CBG:  Recent Labs  02/26/15 2151 02/27/15 0619 02/27/15 1132  GLUCAP 162* 115* 193*     Dg Chest Port 1 View  02/23/2015  CLINICAL DATA:  Weakness. History of coronary artery disease, TIA and diabetes. EXAM: PORTABLE CHEST - 1 VIEW COMPARISON:  12/31/2014 FINDINGS: Cardiac silhouette is normal in size and configuration. No mediastinal or hilar masses or evidence of adenopathy. Clear lungs. No pleural effusion or pneumothorax. Bony thorax is grossly intact. IMPRESSION: No acute cardiopulmonary disease. Electronically Signed   By: Amie Portland M.D.   On: 02/23/2015 14:42    ASSESSMENT/PLAN:  Physical deconditioning - for home health PT, OT and  Nursing   DKA without coma - hemoglobin A1c 8.1; continue Lantus 12 units subcutaneous every morning and Humalog sliding scale SQ ACHS  Depression - mood is stable; continue Zoloft 50 mg+ 100 mg = 150 mg by mouth daily, Lamictal 50 mg by mouth daily and Abilify 5 mg by mouth every morning and 2 mg by mouth daily at bedtime  Ankle fracture S/P ORIF - for home health PT, OT and nursing; continue aspirin 325 mg 1 tab by mouth daily for DVT prophylaxis; follow-up with orthopedics  Hyperlipidemia - continue atorvastatin 20 mg by mouth daily  Hypertension - well controlled; continue Minipress 1 mg 1 capsule by mouth daily and amlodipine 5 mg 1 tab by mouth daily  Constipation - continue MiraLAX 17 g by mouth daily when necessary and Colace 100 mg 1 capsule by mouth daily  GERD - continue omeprazole  40 mg 1 capsule by mouth daily  Anxiety - mood is stable; continue Ativan 0.5 mg 1 tab by mouth TID PRN  Anemia, iron deficiency - hemoglobin 9.3 ; continue ferrous sulfate 325 mg 1 tab by mouth twice a day  Dementia - continue Aricept 10 mg 1 tab by mouth daily at bedtime      I have filled out patient's discharge paperwork and written prescriptions.  Patient will receive home health PT, OT and Nursing.  Total discharge time: Greater than 30 minutes  Discharge time involved coordination of the discharge process with social worker, nursing staff and therapy department. Medical justification for home health services verified.    [facility] Behavior[facility] Health CenterMEDINA-VARGAS,Heloise Gordan, NP [facility] [phone]

## 2015-03-30 DIAGNOSIS — E1065 Type 1 diabetes mellitus with hyperglycemia: Secondary | ICD-10-CM | POA: Diagnosis not present

## 2015-03-30 DIAGNOSIS — I2510 Atherosclerotic heart disease of native coronary artery without angina pectoris: Secondary | ICD-10-CM | POA: Diagnosis not present

## 2015-03-30 DIAGNOSIS — F0390 Unspecified dementia without behavioral disturbance: Secondary | ICD-10-CM | POA: Diagnosis not present

## 2015-03-30 DIAGNOSIS — S82892D Other fracture of left lower leg, subsequent encounter for closed fracture with routine healing: Secondary | ICD-10-CM | POA: Diagnosis not present

## 2015-03-30 DIAGNOSIS — M797 Fibromyalgia: Secondary | ICD-10-CM | POA: Diagnosis not present

## 2015-03-30 DIAGNOSIS — M6281 Muscle weakness (generalized): Secondary | ICD-10-CM | POA: Diagnosis not present

## 2015-03-31 DIAGNOSIS — D649 Anemia, unspecified: Secondary | ICD-10-CM | POA: Diagnosis not present

## 2015-03-31 DIAGNOSIS — Z23 Encounter for immunization: Secondary | ICD-10-CM | POA: Diagnosis not present

## 2015-03-31 DIAGNOSIS — E10319 Type 1 diabetes mellitus with unspecified diabetic retinopathy without macular edema: Secondary | ICD-10-CM | POA: Diagnosis not present

## 2015-04-02 DIAGNOSIS — S82892D Other fracture of left lower leg, subsequent encounter for closed fracture with routine healing: Secondary | ICD-10-CM | POA: Diagnosis not present

## 2015-04-02 DIAGNOSIS — M6281 Muscle weakness (generalized): Secondary | ICD-10-CM | POA: Diagnosis not present

## 2015-04-02 DIAGNOSIS — F0390 Unspecified dementia without behavioral disturbance: Secondary | ICD-10-CM | POA: Diagnosis not present

## 2015-04-02 DIAGNOSIS — I2510 Atherosclerotic heart disease of native coronary artery without angina pectoris: Secondary | ICD-10-CM | POA: Diagnosis not present

## 2015-04-02 DIAGNOSIS — E1065 Type 1 diabetes mellitus with hyperglycemia: Secondary | ICD-10-CM | POA: Diagnosis not present

## 2015-04-02 DIAGNOSIS — M797 Fibromyalgia: Secondary | ICD-10-CM | POA: Diagnosis not present

## 2015-04-03 DIAGNOSIS — S82852D Displaced trimalleolar fracture of left lower leg, subsequent encounter for closed fracture with routine healing: Secondary | ICD-10-CM | POA: Diagnosis not present

## 2015-04-05 DIAGNOSIS — F0390 Unspecified dementia without behavioral disturbance: Secondary | ICD-10-CM | POA: Diagnosis not present

## 2015-04-05 DIAGNOSIS — I2510 Atherosclerotic heart disease of native coronary artery without angina pectoris: Secondary | ICD-10-CM | POA: Diagnosis not present

## 2015-04-05 DIAGNOSIS — M797 Fibromyalgia: Secondary | ICD-10-CM | POA: Diagnosis not present

## 2015-04-05 DIAGNOSIS — S82892D Other fracture of left lower leg, subsequent encounter for closed fracture with routine healing: Secondary | ICD-10-CM | POA: Diagnosis not present

## 2015-04-05 DIAGNOSIS — M6281 Muscle weakness (generalized): Secondary | ICD-10-CM | POA: Diagnosis not present

## 2015-04-05 DIAGNOSIS — E1065 Type 1 diabetes mellitus with hyperglycemia: Secondary | ICD-10-CM | POA: Diagnosis not present

## 2015-04-08 DIAGNOSIS — M797 Fibromyalgia: Secondary | ICD-10-CM | POA: Diagnosis not present

## 2015-04-08 DIAGNOSIS — E1065 Type 1 diabetes mellitus with hyperglycemia: Secondary | ICD-10-CM | POA: Diagnosis not present

## 2015-04-08 DIAGNOSIS — F3181 Bipolar II disorder: Secondary | ICD-10-CM | POA: Diagnosis not present

## 2015-04-08 DIAGNOSIS — S82892D Other fracture of left lower leg, subsequent encounter for closed fracture with routine healing: Secondary | ICD-10-CM | POA: Diagnosis not present

## 2015-04-08 DIAGNOSIS — F0390 Unspecified dementia without behavioral disturbance: Secondary | ICD-10-CM | POA: Diagnosis not present

## 2015-04-08 DIAGNOSIS — M6281 Muscle weakness (generalized): Secondary | ICD-10-CM | POA: Diagnosis not present

## 2015-04-08 DIAGNOSIS — I2510 Atherosclerotic heart disease of native coronary artery without angina pectoris: Secondary | ICD-10-CM | POA: Diagnosis not present

## 2015-04-09 DIAGNOSIS — I2510 Atherosclerotic heart disease of native coronary artery without angina pectoris: Secondary | ICD-10-CM | POA: Diagnosis not present

## 2015-04-09 DIAGNOSIS — E1065 Type 1 diabetes mellitus with hyperglycemia: Secondary | ICD-10-CM | POA: Diagnosis not present

## 2015-04-09 DIAGNOSIS — F0390 Unspecified dementia without behavioral disturbance: Secondary | ICD-10-CM | POA: Diagnosis not present

## 2015-04-09 DIAGNOSIS — M797 Fibromyalgia: Secondary | ICD-10-CM | POA: Diagnosis not present

## 2015-04-09 DIAGNOSIS — S82892D Other fracture of left lower leg, subsequent encounter for closed fracture with routine healing: Secondary | ICD-10-CM | POA: Diagnosis not present

## 2015-04-09 DIAGNOSIS — M6281 Muscle weakness (generalized): Secondary | ICD-10-CM | POA: Diagnosis not present

## 2015-04-10 DIAGNOSIS — M6281 Muscle weakness (generalized): Secondary | ICD-10-CM | POA: Diagnosis not present

## 2015-04-10 DIAGNOSIS — F0390 Unspecified dementia without behavioral disturbance: Secondary | ICD-10-CM | POA: Diagnosis not present

## 2015-04-10 DIAGNOSIS — E1065 Type 1 diabetes mellitus with hyperglycemia: Secondary | ICD-10-CM | POA: Diagnosis not present

## 2015-04-10 DIAGNOSIS — M797 Fibromyalgia: Secondary | ICD-10-CM | POA: Diagnosis not present

## 2015-04-10 DIAGNOSIS — S82892D Other fracture of left lower leg, subsequent encounter for closed fracture with routine healing: Secondary | ICD-10-CM | POA: Diagnosis not present

## 2015-04-10 DIAGNOSIS — I2510 Atherosclerotic heart disease of native coronary artery without angina pectoris: Secondary | ICD-10-CM | POA: Diagnosis not present

## 2015-04-11 DIAGNOSIS — E1065 Type 1 diabetes mellitus with hyperglycemia: Secondary | ICD-10-CM | POA: Diagnosis not present

## 2015-04-11 DIAGNOSIS — F0390 Unspecified dementia without behavioral disturbance: Secondary | ICD-10-CM | POA: Diagnosis not present

## 2015-04-11 DIAGNOSIS — M6281 Muscle weakness (generalized): Secondary | ICD-10-CM | POA: Diagnosis not present

## 2015-04-11 DIAGNOSIS — M797 Fibromyalgia: Secondary | ICD-10-CM | POA: Diagnosis not present

## 2015-04-11 DIAGNOSIS — I2510 Atherosclerotic heart disease of native coronary artery without angina pectoris: Secondary | ICD-10-CM | POA: Diagnosis not present

## 2015-04-11 DIAGNOSIS — S82892D Other fracture of left lower leg, subsequent encounter for closed fracture with routine healing: Secondary | ICD-10-CM | POA: Diagnosis not present

## 2015-04-14 DIAGNOSIS — S82892D Other fracture of left lower leg, subsequent encounter for closed fracture with routine healing: Secondary | ICD-10-CM | POA: Diagnosis not present

## 2015-04-14 DIAGNOSIS — I2510 Atherosclerotic heart disease of native coronary artery without angina pectoris: Secondary | ICD-10-CM | POA: Diagnosis not present

## 2015-04-14 DIAGNOSIS — M6281 Muscle weakness (generalized): Secondary | ICD-10-CM | POA: Diagnosis not present

## 2015-04-14 DIAGNOSIS — M797 Fibromyalgia: Secondary | ICD-10-CM | POA: Diagnosis not present

## 2015-04-14 DIAGNOSIS — E1065 Type 1 diabetes mellitus with hyperglycemia: Secondary | ICD-10-CM | POA: Diagnosis not present

## 2015-04-14 DIAGNOSIS — F0390 Unspecified dementia without behavioral disturbance: Secondary | ICD-10-CM | POA: Diagnosis not present

## 2015-04-15 DIAGNOSIS — I2510 Atherosclerotic heart disease of native coronary artery without angina pectoris: Secondary | ICD-10-CM | POA: Diagnosis not present

## 2015-04-15 DIAGNOSIS — M797 Fibromyalgia: Secondary | ICD-10-CM | POA: Diagnosis not present

## 2015-04-15 DIAGNOSIS — F0390 Unspecified dementia without behavioral disturbance: Secondary | ICD-10-CM | POA: Diagnosis not present

## 2015-04-15 DIAGNOSIS — M6281 Muscle weakness (generalized): Secondary | ICD-10-CM | POA: Diagnosis not present

## 2015-04-15 DIAGNOSIS — S82892D Other fracture of left lower leg, subsequent encounter for closed fracture with routine healing: Secondary | ICD-10-CM | POA: Diagnosis not present

## 2015-04-15 DIAGNOSIS — Z4681 Encounter for fitting and adjustment of insulin pump: Secondary | ICD-10-CM | POA: Diagnosis not present

## 2015-04-15 DIAGNOSIS — E1065 Type 1 diabetes mellitus with hyperglycemia: Secondary | ICD-10-CM | POA: Diagnosis not present

## 2015-04-15 DIAGNOSIS — Z681 Body mass index (BMI) 19 or less, adult: Secondary | ICD-10-CM | POA: Diagnosis not present

## 2015-04-15 DIAGNOSIS — E10319 Type 1 diabetes mellitus with unspecified diabetic retinopathy without macular edema: Secondary | ICD-10-CM | POA: Diagnosis not present

## 2015-04-16 DIAGNOSIS — I2510 Atherosclerotic heart disease of native coronary artery without angina pectoris: Secondary | ICD-10-CM | POA: Diagnosis not present

## 2015-04-16 DIAGNOSIS — M797 Fibromyalgia: Secondary | ICD-10-CM | POA: Diagnosis not present

## 2015-04-16 DIAGNOSIS — M6281 Muscle weakness (generalized): Secondary | ICD-10-CM | POA: Diagnosis not present

## 2015-04-16 DIAGNOSIS — F0390 Unspecified dementia without behavioral disturbance: Secondary | ICD-10-CM | POA: Diagnosis not present

## 2015-04-16 DIAGNOSIS — E1065 Type 1 diabetes mellitus with hyperglycemia: Secondary | ICD-10-CM | POA: Diagnosis not present

## 2015-04-16 DIAGNOSIS — S82892D Other fracture of left lower leg, subsequent encounter for closed fracture with routine healing: Secondary | ICD-10-CM | POA: Diagnosis not present

## 2015-04-17 DIAGNOSIS — S82892D Other fracture of left lower leg, subsequent encounter for closed fracture with routine healing: Secondary | ICD-10-CM | POA: Diagnosis not present

## 2015-04-17 DIAGNOSIS — M797 Fibromyalgia: Secondary | ICD-10-CM | POA: Diagnosis not present

## 2015-04-17 DIAGNOSIS — I2510 Atherosclerotic heart disease of native coronary artery without angina pectoris: Secondary | ICD-10-CM | POA: Diagnosis not present

## 2015-04-17 DIAGNOSIS — F0390 Unspecified dementia without behavioral disturbance: Secondary | ICD-10-CM | POA: Diagnosis not present

## 2015-04-17 DIAGNOSIS — E1065 Type 1 diabetes mellitus with hyperglycemia: Secondary | ICD-10-CM | POA: Diagnosis not present

## 2015-04-17 DIAGNOSIS — M6281 Muscle weakness (generalized): Secondary | ICD-10-CM | POA: Diagnosis not present

## 2015-04-24 ENCOUNTER — Inpatient Hospital Stay (HOSPITAL_COMMUNITY)
Admission: EM | Admit: 2015-04-24 | Discharge: 2015-04-28 | DRG: 637 | Disposition: A | Discharge status: Skilled Nursing Facility | Payer: Medicare Other | Attending: Internal Medicine | Admitting: Internal Medicine

## 2015-04-24 ENCOUNTER — Encounter (HOSPITAL_COMMUNITY): Payer: Self-pay | Admitting: *Deleted

## 2015-04-24 DIAGNOSIS — E876 Hypokalemia: Secondary | ICD-10-CM | POA: Diagnosis present

## 2015-04-24 DIAGNOSIS — Z79899 Other long term (current) drug therapy: Secondary | ICD-10-CM

## 2015-04-24 DIAGNOSIS — E1069 Type 1 diabetes mellitus with other specified complication: Secondary | ICD-10-CM | POA: Diagnosis not present

## 2015-04-24 DIAGNOSIS — M6281 Muscle weakness (generalized): Secondary | ICD-10-CM | POA: Diagnosis not present

## 2015-04-24 DIAGNOSIS — R748 Abnormal levels of other serum enzymes: Secondary | ICD-10-CM | POA: Diagnosis present

## 2015-04-24 DIAGNOSIS — M797 Fibromyalgia: Secondary | ICD-10-CM | POA: Diagnosis present

## 2015-04-24 DIAGNOSIS — R1111 Vomiting without nausea: Secondary | ICD-10-CM | POA: Diagnosis not present

## 2015-04-24 DIAGNOSIS — R739 Hyperglycemia, unspecified: Secondary | ICD-10-CM | POA: Diagnosis not present

## 2015-04-24 DIAGNOSIS — R2681 Unsteadiness on feet: Secondary | ICD-10-CM | POA: Diagnosis not present

## 2015-04-24 DIAGNOSIS — E0810 Diabetes mellitus due to underlying condition with ketoacidosis without coma: Secondary | ICD-10-CM | POA: Diagnosis not present

## 2015-04-24 DIAGNOSIS — F339 Major depressive disorder, recurrent, unspecified: Secondary | ICD-10-CM | POA: Diagnosis not present

## 2015-04-24 DIAGNOSIS — N179 Acute kidney failure, unspecified: Secondary | ICD-10-CM | POA: Diagnosis present

## 2015-04-24 DIAGNOSIS — K219 Gastro-esophageal reflux disease without esophagitis: Secondary | ICD-10-CM | POA: Diagnosis not present

## 2015-04-24 DIAGNOSIS — I2510 Atherosclerotic heart disease of native coronary artery without angina pectoris: Secondary | ICD-10-CM | POA: Diagnosis present

## 2015-04-24 DIAGNOSIS — R011 Cardiac murmur, unspecified: Secondary | ICD-10-CM | POA: Diagnosis present

## 2015-04-24 DIAGNOSIS — Z5189 Encounter for other specified aftercare: Secondary | ICD-10-CM | POA: Diagnosis not present

## 2015-04-24 DIAGNOSIS — E1010 Type 1 diabetes mellitus with ketoacidosis without coma: Principal | ICD-10-CM

## 2015-04-24 DIAGNOSIS — D649 Anemia, unspecified: Secondary | ICD-10-CM | POA: Diagnosis present

## 2015-04-24 DIAGNOSIS — K920 Hematemesis: Secondary | ICD-10-CM | POA: Diagnosis not present

## 2015-04-24 DIAGNOSIS — R1310 Dysphagia, unspecified: Secondary | ICD-10-CM

## 2015-04-24 DIAGNOSIS — Z8673 Personal history of transient ischemic attack (TIA), and cerebral infarction without residual deficits: Secondary | ICD-10-CM

## 2015-04-24 DIAGNOSIS — I272 Other secondary pulmonary hypertension: Secondary | ICD-10-CM | POA: Diagnosis not present

## 2015-04-24 DIAGNOSIS — F329 Major depressive disorder, single episode, unspecified: Secondary | ICD-10-CM | POA: Diagnosis present

## 2015-04-24 DIAGNOSIS — G8929 Other chronic pain: Secondary | ICD-10-CM | POA: Diagnosis present

## 2015-04-24 DIAGNOSIS — F0390 Unspecified dementia without behavioral disturbance: Secondary | ICD-10-CM | POA: Diagnosis present

## 2015-04-24 DIAGNOSIS — I10 Essential (primary) hypertension: Secondary | ICD-10-CM | POA: Diagnosis present

## 2015-04-24 DIAGNOSIS — M545 Low back pain: Secondary | ICD-10-CM | POA: Diagnosis present

## 2015-04-24 DIAGNOSIS — G9340 Encephalopathy, unspecified: Secondary | ICD-10-CM | POA: Diagnosis not present

## 2015-04-24 DIAGNOSIS — E10319 Type 1 diabetes mellitus with unspecified diabetic retinopathy without macular edema: Secondary | ICD-10-CM | POA: Diagnosis present

## 2015-04-24 DIAGNOSIS — Z9641 Presence of insulin pump (external) (internal): Secondary | ICD-10-CM | POA: Diagnosis present

## 2015-04-24 DIAGNOSIS — M25572 Pain in left ankle and joints of left foot: Secondary | ICD-10-CM | POA: Diagnosis not present

## 2015-04-24 DIAGNOSIS — Z794 Long term (current) use of insulin: Secondary | ICD-10-CM | POA: Diagnosis not present

## 2015-04-24 DIAGNOSIS — Z681 Body mass index (BMI) 19 or less, adult: Secondary | ICD-10-CM | POA: Diagnosis not present

## 2015-04-24 DIAGNOSIS — D72829 Elevated white blood cell count, unspecified: Secondary | ICD-10-CM

## 2015-04-24 DIAGNOSIS — E1310 Other specified diabetes mellitus with ketoacidosis without coma: Secondary | ICD-10-CM | POA: Diagnosis not present

## 2015-04-24 DIAGNOSIS — Z955 Presence of coronary angioplasty implant and graft: Secondary | ICD-10-CM

## 2015-04-24 DIAGNOSIS — M13849 Other specified arthritis, unspecified hand: Secondary | ICD-10-CM | POA: Diagnosis present

## 2015-04-24 DIAGNOSIS — R000 Tachycardia, unspecified: Secondary | ICD-10-CM | POA: Diagnosis not present

## 2015-04-24 DIAGNOSIS — R1314 Dysphagia, pharyngoesophageal phase: Secondary | ICD-10-CM | POA: Diagnosis not present

## 2015-04-24 DIAGNOSIS — F17210 Nicotine dependence, cigarettes, uncomplicated: Secondary | ICD-10-CM | POA: Diagnosis present

## 2015-04-24 DIAGNOSIS — I129 Hypertensive chronic kidney disease with stage 1 through stage 4 chronic kidney disease, or unspecified chronic kidney disease: Secondary | ICD-10-CM | POA: Diagnosis not present

## 2015-04-24 DIAGNOSIS — E1110 Type 2 diabetes mellitus with ketoacidosis without coma: Secondary | ICD-10-CM | POA: Diagnosis present

## 2015-04-24 LAB — TROPONIN I
TROPONIN I: 0.07 ng/mL — AB (ref <0.031)
Troponin I: 0.04 ng/mL — ABNORMAL HIGH (ref <0.031)

## 2015-04-24 LAB — BASIC METABOLIC PANEL
Anion gap: 24 — ABNORMAL HIGH (ref 5–15)
Anion gap: 13 (ref 5–15)
Anion gap: 13 (ref 5–15)
BUN: 22 mg/dL — ABNORMAL HIGH (ref 6–20)
BUN: 16 mg/dL (ref 6–20)
BUN: 17 mg/dL (ref 6–20)
CHLORIDE: 111 mmol/L (ref 101–111)
CHLORIDE: 111 mmol/L (ref 101–111)
CO2: 12 mmol/L — ABNORMAL LOW (ref 22–32)
CO2: 20 mmol/L — ABNORMAL LOW (ref 22–32)
CO2: 19 mmol/L — ABNORMAL LOW (ref 22–32)
CREATININE: 1 mg/dL (ref 0.44–1.00)
Calcium: 10.3 mg/dL (ref 8.9–10.3)
Calcium: 9.8 mg/dL (ref 8.9–10.3)
Calcium: 10.1 mg/dL (ref 8.9–10.3)
Chloride: 102 mmol/L (ref 101–111)
Creatinine, Ser: 1.49 mg/dL — ABNORMAL HIGH (ref 0.44–1.00)
Creatinine, Ser: 1.13 mg/dL — ABNORMAL HIGH (ref 0.44–1.00)
GFR calc Af Amer: 39 mL/min — ABNORMAL LOW (ref >60)
GFR calc Af Amer: 54 mL/min — ABNORMAL LOW (ref >60)
GFR calc non Af Amer: 34 mL/min — ABNORMAL LOW (ref >60)
GFR calc non Af Amer: 47 mL/min — ABNORMAL LOW (ref >60)
GFR calc non Af Amer: 55 mL/min — ABNORMAL LOW (ref >60)
Glucose, Bld: 496 mg/dL — ABNORMAL HIGH (ref 65–99)
Glucose, Bld: 262 mg/dL — ABNORMAL HIGH (ref 65–99)
Glucose, Bld: 123 mg/dL — ABNORMAL HIGH (ref 65–99)
POTASSIUM: 4.1 mmol/L (ref 3.5–5.1)
Potassium: 3.4 mmol/L — ABNORMAL LOW (ref 3.5–5.1)
Potassium: 3 mmol/L — ABNORMAL LOW (ref 3.5–5.1)
SODIUM: 143 mmol/L (ref 135–145)
Sodium: 138 mmol/L (ref 135–145)
Sodium: 144 mmol/L (ref 135–145)

## 2015-04-24 LAB — CBC
HCT: 34.9 % — ABNORMAL LOW (ref 36.0–46.0)
HEMATOCRIT: 32.3 % — AB (ref 36.0–46.0)
Hemoglobin: 11.3 g/dL — ABNORMAL LOW (ref 12.0–15.0)
Hemoglobin: 10.4 g/dL — ABNORMAL LOW (ref 12.0–15.0)
MCH: 27.8 pg (ref 26.0–34.0)
MCH: 27.2 pg (ref 26.0–34.0)
MCHC: 32.4 g/dL (ref 30.0–36.0)
MCHC: 32.2 g/dL (ref 30.0–36.0)
MCV: 85.7 fL (ref 78.0–100.0)
MCV: 84.6 fL (ref 78.0–100.0)
Platelets: 279 10*3/uL (ref 150–400)
Platelets: 267 10*3/uL (ref 150–400)
RBC: 4.07 MIL/uL (ref 3.87–5.11)
RBC: 3.82 MIL/uL — ABNORMAL LOW (ref 3.87–5.11)
RDW: 15.2 % (ref 11.5–15.5)
RDW: 15.3 % (ref 11.5–15.5)
WBC: 17.3 10*3/uL — ABNORMAL HIGH (ref 4.0–10.5)
WBC: 14.8 10*3/uL — AB (ref 4.0–10.5)

## 2015-04-24 LAB — GLUCOSE, CAPILLARY
GLUCOSE-CAPILLARY: 317 mg/dL — AB (ref 65–99)
GLUCOSE-CAPILLARY: 168 mg/dL — AB (ref 65–99)
Glucose-Capillary: 357 mg/dL — ABNORMAL HIGH (ref 65–99)
Glucose-Capillary: 249 mg/dL — ABNORMAL HIGH (ref 65–99)
Glucose-Capillary: 136 mg/dL — ABNORMAL HIGH (ref 65–99)
Glucose-Capillary: 104 mg/dL — ABNORMAL HIGH (ref 65–99)
Glucose-Capillary: 117 mg/dL — ABNORMAL HIGH (ref 65–99)

## 2015-04-24 LAB — URINE MICROSCOPIC-ADD ON: Bacteria, UA: NONE SEEN

## 2015-04-24 LAB — URINALYSIS, ROUTINE W REFLEX MICROSCOPIC
Bilirubin Urine: NEGATIVE
Glucose, UA: >1000 mg/dL — AB
Ketones, ur: >80 mg/dL — AB
Leukocytes, UA: NEGATIVE
Nitrite: NEGATIVE
Protein, ur: NEGATIVE mg/dL
Specific Gravity, Urine: 1.022 (ref 1.005–1.030)
pH: 5 (ref 5.0–8.0)

## 2015-04-24 LAB — TYPE AND SCREEN
ABO/RH(D): AB POS
Antibody Screen: NEGATIVE

## 2015-04-24 LAB — CBG MONITORING, ED
Glucose-Capillary: 477 mg/dL — ABNORMAL HIGH (ref 65–99)
Glucose-Capillary: 368 mg/dL — ABNORMAL HIGH (ref 65–99)

## 2015-04-24 LAB — MRSA PCR SCREENING: MRSA BY PCR: NEGATIVE

## 2015-04-24 MED ORDER — ONDANSETRON HCL 4 MG/2ML IJ SOLN
4.0000 mg | Freq: Three times a day (TID) | INTRAMUSCULAR | Status: AC | PRN
  Administered 2015-04-24: 4 mg via INTRAVENOUS
  Filled 2015-04-24: qty 2

## 2015-04-24 MED ORDER — PROCEL PO POWD: 1.0000 | Freq: Two times a day (BID) | ORAL | Status: DC

## 2015-04-24 MED ORDER — DEXTROSE-NACL 5-0.45 % IV SOLN
INTRAVENOUS | Status: DC
  Administered 2015-04-24: 19:00:00 via INTRAVENOUS

## 2015-04-24 MED ORDER — POLYETHYLENE GLYCOL 3350 17 G PO PACK: 17.0000 g | PACK | Freq: Every day | ORAL | Status: DC | PRN

## 2015-04-24 MED ORDER — DOCUSATE SODIUM 100 MG PO CAPS
100.0000 mg | ORAL_CAPSULE | Freq: Two times a day (BID) | ORAL | Status: DC
  Administered 2015-04-24 – 2015-04-28 (×8): 100 mg via ORAL
  Filled 2015-04-24 (×8): qty 1

## 2015-04-24 MED ORDER — SODIUM CHLORIDE 0.9 % IV SOLN
INTRAVENOUS | Status: DC
  Administered 2015-04-24: 4.4 [IU]/h via INTRAVENOUS
  Filled 2015-04-24: qty 2.5

## 2015-04-24 MED ORDER — SODIUM CHLORIDE 0.9 % IV SOLN
INTRAVENOUS | Status: DC
Start: 2015-04-24 — End: 2015-04-25

## 2015-04-24 MED ORDER — ROSUVASTATIN CALCIUM 5 MG PO TABS
5.0000 mg | ORAL_TABLET | Freq: Every day | ORAL | Status: DC
  Administered 2015-04-24 – 2015-04-28 (×5): 5 mg via ORAL
  Filled 2015-04-24 (×5): qty 1

## 2015-04-24 MED ORDER — ARIPIPRAZOLE 10 MG PO TABS
5.0000 mg | ORAL_TABLET | Freq: Every day | ORAL | Status: DC
  Administered 2015-04-25 – 2015-04-28 (×4): 5 mg via ORAL
  Filled 2015-04-24 (×5): qty 1

## 2015-04-24 MED ORDER — LAMOTRIGINE 25 MG PO TABS
50.0000 mg | ORAL_TABLET | Freq: Every day | ORAL | Status: DC
  Administered 2015-04-24 – 2015-04-28 (×5): 50 mg via ORAL
  Filled 2015-04-24 (×5): qty 2

## 2015-04-24 MED ORDER — SODIUM CHLORIDE 0.9 % IV BOLUS (SEPSIS)
500.0000 mL | Freq: Once | INTRAVENOUS | Status: AC
  Administered 2015-04-24: 500 mL via INTRAVENOUS

## 2015-04-24 MED ORDER — SODIUM CHLORIDE 0.9 % IV SOLN
1000.0000 mL | INTRAVENOUS | Status: DC
  Administered 2015-04-24: 1000 mL via INTRAVENOUS

## 2015-04-24 MED ORDER — SODIUM CHLORIDE 0.9 % IV SOLN
INTRAVENOUS | Status: DC
  Administered 2015-04-24: 16:00:00 via INTRAVENOUS

## 2015-04-24 MED ORDER — LORAZEPAM 0.5 MG PO TABS
0.5000 mg | ORAL_TABLET | Freq: Three times a day (TID) | ORAL | Status: DC | PRN
  Filled 2015-04-24: qty 1

## 2015-04-24 MED ORDER — AMLODIPINE BESYLATE 5 MG PO TABS
5.0000 mg | ORAL_TABLET | Freq: Every day | ORAL | Status: DC
  Administered 2015-04-24 – 2015-04-25 (×2): 5 mg via ORAL
  Filled 2015-04-24 (×2): qty 1

## 2015-04-24 MED ORDER — ONDANSETRON HCL 4 MG/2ML IJ SOLN
4.0000 mg | Freq: Once | INTRAMUSCULAR | Status: AC
  Administered 2015-04-24: 4 mg via INTRAVENOUS
  Filled 2015-04-24: qty 2

## 2015-04-24 MED ORDER — PANTOPRAZOLE SODIUM 40 MG IV SOLR
40.0000 mg | Freq: Once | INTRAVENOUS | Status: AC
  Administered 2015-04-24: 40 mg via INTRAVENOUS
  Filled 2015-04-24: qty 40

## 2015-04-24 MED ORDER — DONEPEZIL HCL 10 MG PO TABS
10.0000 mg | ORAL_TABLET | Freq: Every day | ORAL | Status: DC
  Administered 2015-04-24 – 2015-04-26 (×3): 10 mg via ORAL
  Filled 2015-04-24 (×4): qty 1

## 2015-04-24 MED ORDER — INSULIN REGULAR HUMAN 100 UNIT/ML IJ SOLN
INTRAMUSCULAR | Status: DC
  Administered 2015-04-24: 7.6 [IU]/h via INTRAVENOUS
  Filled 2015-04-24: qty 2.5

## 2015-04-24 MED ORDER — ARIPIPRAZOLE 2 MG PO TABS
2.0000 mg | ORAL_TABLET | Freq: Every day | ORAL | Status: DC
  Administered 2015-04-24 – 2015-04-27 (×4): 2 mg via ORAL
  Filled 2015-04-24 (×5): qty 1

## 2015-04-24 MED ORDER — ATORVASTATIN CALCIUM 20 MG PO TABS: 20.0000 mg | ORAL_TABLET | Freq: Every day | ORAL | Status: DC

## 2015-04-24 MED ORDER — PANTOPRAZOLE SODIUM 40 MG IV SOLR
40.0000 mg | Freq: Two times a day (BID) | INTRAVENOUS | Status: DC
  Administered 2015-04-24 – 2015-04-25 (×3): 40 mg via INTRAVENOUS
  Filled 2015-04-24 (×3): qty 40

## 2015-04-24 MED ORDER — SERTRALINE HCL 50 MG PO TABS
150.0000 mg | ORAL_TABLET | Freq: Every day | ORAL | Status: DC
  Administered 2015-04-24 – 2015-04-28 (×5): 150 mg via ORAL
  Filled 2015-04-24 (×5): qty 1

## 2015-04-24 MED ORDER — ADULT MULTIVITAMIN W/MINERALS CH
1.0000 | ORAL_TABLET | Freq: Every day | ORAL | Status: DC
  Administered 2015-04-24 – 2015-04-28 (×5): 1 via ORAL
  Filled 2015-04-24 (×5): qty 1

## 2015-04-24 MED ORDER — POTASSIUM CHLORIDE 10 MEQ/100ML IV SOLN
10.0000 meq | INTRAVENOUS | Status: DC
  Administered 2015-04-24: 10 meq via INTRAVENOUS
  Filled 2015-04-24: qty 100

## 2015-04-24 MED ORDER — POTASSIUM CHLORIDE 10 MEQ/100ML IV SOLN
10.0000 meq | INTRAVENOUS | Status: AC
  Administered 2015-04-24 (×4): 10 meq via INTRAVENOUS
  Filled 2015-04-24 (×4): qty 100

## 2015-04-24 MED ORDER — PRAZOSIN HCL 1 MG PO CAPS
1.0000 mg | ORAL_CAPSULE | Freq: Every day | ORAL | Status: DC
  Administered 2015-04-24 – 2015-04-28 (×5): 1 mg via ORAL
  Filled 2015-04-24 (×5): qty 1

## 2015-04-24 MED ORDER — DEXTROSE-NACL 5-0.45 % IV SOLN: INTRAVENOUS | Status: DC

## 2015-04-24 MED ORDER — SODIUM CHLORIDE 0.9 % IV SOLN
1000.0000 mL | Freq: Once | INTRAVENOUS | Status: AC
  Administered 2015-04-24: 1000 mL via INTRAVENOUS

## 2015-04-24 NOTE — H&P (Signed)
[facility] History and Physical  Beth Payne ZOX:[id number] DOB: 01/08/1941 DOA: 04/24/2015  Referring physician: Juleen Chinakohut PCP: Julian HySOUTH,STEPHEN ALAN, MD   Chief Complaint: dka and heatemesis  HPI: Beth Aristahomasine C Bord is a 74 y.o. female the past medical history that includes diabetes formally on insulin ecently transitioned to injections, CAD, hypertension, GERD, resents to the emergency department with the chief complaint of acute encephalopathy and hyperglycemia. Initial evaluation reveals DKA and hematemesis.  Information is obtained from the chart as patient information is unreliable related to acute encephalopathy. She came to the emergency department with a blood glucose of 500 anion gap 16. In addition ketones in her urine. Associated symptoms include nausea however her emesis appears coffee-ground. She denies any chest pain palpitations abdominal pain she does endorse nausea. She denies dysuria hematuria frequency or urgency. She denies constipation diarrhea melena bright red blood per rectum. Home medications include no aspirin or NSAIDs.  Workup in the emergency department includes complete blood count significant for WBCs of 17.3 hemoglobin 11.3, urinalysis with ketones and glucose. She is provided with 1 L of normal saline and insulin drip initiated. She is also provided with 40 mg of Protonix IV.  In the emergency department she's afebrile hemodynamically stable and not hypoxic.  Review of Systems:  10 point review of systems completed and all systems are negative except as indicated in the history of present illness per patient albeit information may be unreliable Past Medical History  Diagnosis Date  . Coronary artery disease   . Depression   . Retinopathy   . Anxiety   . Heart murmur   . TIA (transient ischemic attack)   . Arthritis     "fingers" (12/23/2014)  . Fibromyalgia   . Type I diabetes mellitus (HCC) dx'd 1953    "I'm on a pup" (12/23/2014)  . Chronic lower  back pain   . GERD (gastroesophageal reflux disease) dx'd 1995  . Osteopenia dx'd 2007   Past Surgical History  Procedure Laterality Date  . Cataract extraction w/ intraocular lens  implant, bilateral Bilateral 1998  . Tonsillectomy    . Appendectomy    . Bilateral carpal tunnel release Bilateral 2003-2004  . Back surgery    . Anterior cervical decomp/discectomy fusion  05/2003    "C5-6"  . Abdominal hysterectomy  1972  . Dilation and curettage of uterus    . Cesarean section    . Eye surgery Bilateral since 1977    "numerous slaser surgeries for retinopathy"  . Trigger finger release Bilateral 2003  . Cervical disc surgery  2004    "collapsed C-5"  . Fracture surgery    . Patella fracture surgery Left 04/2007  . Vertebroplasty  01/2008    "compressed lumbar fracture"  . Laparoscopic lysis of adhesions  1975-1987 X 5  . Exploratory laparotomy  [id number] X 1    "adhesions"  . Oophorectomy Bilateral 1980's  . Vitrectomy Right 08/1997  . Bunionectomy with hammertoe reconstruction Right 02/1999  . Coronary angioplasty with stent placement  07/1999    "?1"  . Lumbar microdiscectomy  03/2005  . Orif ankle fracture Left 12/25/2014    Procedure: OPEN REDUCTION INTERNAL FIXATION (ORIF) LEFT TRIMALLEOLAR ANKLE FRACTURE;  Surgeon: Tarry KosNaiping M Xu, MD;  Location: [facility] OR;  Service: Orthopedics;  Laterality: Left;   Social History:  reports that she has been smoking Cigarettes.  She has a 27 pack-year smoking history. She has never used smokeless tobacco. She reports that she does not drink alcohol or  use illicit drugs.  Allergies  Allergen Reactions  . Diclofenac Sodium Swelling and Other (See Comments)    Blurred vision  . Doxycycline Calcium Swelling and Other (See Comments)    Blurred vision  . Codeine Nausea And Vomiting and Other (See Comments)    Dizziness   . Zocor [Simvastatin] Other (See Comments)    Dizziness, weakness     Family History  Problem Relation Age of Onset  .  Liver disease Sister   . Colon cancer Brother     Prior to Admission medications   Medication Sig Start Date End Date Taking? Authorizing Provider  amLODipine (NORVASC) 5 MG tablet Take 1 tablet (5 mg total) by mouth daily. 01/01/15  Yes Rodolph Bong, MD  ARIPiprazole (ABILIFY) 5 MG tablet Take 5 mg by mouth daily.   Yes Historical Provider, MD  atorvastatin (LIPITOR) 20 MG tablet Take 20 mg by mouth daily.   Yes Historical Provider, MD  Cholecalciferol (VITAMIN D3) 2000 UNITS TABS Take by mouth daily. For supplement   Yes Historical Provider, MD  docusate sodium (COLACE) 100 MG capsule Take 100 mg by mouth 2 (two) times daily.   Yes Historical Provider, MD  donepezil (ARICEPT) 10 MG tablet Take 10 mg by mouth at bedtime.   Yes Historical Provider, MD  ferrous sulfate 325 (65 FE) MG tablet Take 325 mg by mouth 2 (two) times daily.   Yes Historical Provider, MD  lamoTRIgine (LAMICTAL) 100 MG tablet Take 50 mg by mouth daily.   Yes Historical Provider, MD  LORazepam (ATIVAN) 0.5 MG tablet Take 0.5 mg by mouth 3 (three) times daily as needed. Anxiety   Yes Historical Provider, MD  Multiple Vitamin (MULITIVITAMIN WITH MINERALS) TABS Take 1 tablet by mouth daily.   Yes Historical Provider, MD  omeprazole (PRILOSEC) 20 MG capsule Take 40 mg by mouth daily.   Yes Historical Provider, MD  prazosin (MINIPRESS) 1 MG capsule Take 1 mg by mouth daily.   Yes Historical Provider, MD  rosuvastatin (CRESTOR) 5 MG tablet Take 5 mg by mouth daily.   Yes Historical Provider, MD  sertraline (ZOLOFT) 100 MG tablet Take 150 mg by mouth daily.   Yes Historical Provider, MD  acetaminophen (TYLENOL) 325 MG tablet Take 2 tablets (650 mg total) by mouth every 6 (six) hours as needed for mild pain (or Fever >/= 101). 12/27/14   Christiane Ha, MD  ARIPiprazole (ABILIFY) 2 MG tablet Take 2 mg by mouth at bedtime.     Historical Provider, MD  aspirin EC 325 MG tablet Take 325 mg by mouth daily.    Historical Provider,  MD  insulin aspart (NOVOLOG) 100 UNIT/ML injection Before each meal 3 times a day, 140-199 - 2 units, 200-250 - 4 units, 251-299 - 6 units,  300-349 - 8 units,  350 or above 10 units. Dispense syringes and needles as needed, Ok to switch to PEN if approved. Substitute to any brand approved. DX DM2, Code E11.65.   You can continue to use previous supply of Novolog/Humolog at this dose Patient not taking: Reported on 04/24/2015 02/27/15   Leroy Sea, MD  insulin glargine (LANTUS) 100 UNIT/ML injection Inject 0.12 mLs (12 Units total) into the skin at bedtime. Patient not taking: Reported on 04/24/2015 02/27/15   Leroy Sea, MD  polyethylene glycol [facility] / Ethelene Hal) packet Take 17 g by mouth daily as needed for mild constipation. 12/27/14   Christiane Ha, MD  Protein (PROCEL) POWD Take  1 scoop by mouth 2 (two) times daily.    Historical Provider, MD   Physical Exam: Filed Vitals:   04/24/15 1030 04/24/15 1045 04/24/15 1200 04/24/15 1300  BP: 155/64 164/64 170/80 156/73  Pulse: 107 98  105  Temp: 98 F (36.7 C)     TempSrc: Axillary     Resp: Height:  (1.575 m)     Weight: 32.659 kg (72 lb)     SpO2: 100% 100%  95%    Wt Readings from Last 3 Encounters:  04/24/15 32.659 kg (72 lb)  03/24/15 48.988 kg (108 lb)  02/28/15 48.081 kg (106 lb)    General:  Appears calm and comfortable, thin and frail Eyes: PERRL, normal lids, irises & conjunctiva ENT: grossly normal hearing, lips & tongue, a dismembered of her mouth are dry slightly pale Neck: no LAD, masses or thyromegaly Cardiovascular: RRR, no m/r/g. No LE edema. Telemetry: SR, no arrhythmias  Respiratory: CTA bilaterally, no w/r/r. Normal respiratory effort. Abdomen: soft, ntnd positive bowel sounds no guarding Skin: no rash or induration seen on limited exam Musculoskeletal: grossly normal tone BUE/BLE Psychiatric: grossly normal mood and affect, speech fluent and appropriate Neurologic: grossly  non-focal. Speech clear facial symmetry is on extremities is able to follow commands oriented to self and place only           Labs on Admission:  Basic Metabolic Panel:  Recent Labs Lab 04/24/15 1158  NA 138  K 3.4*  CL 102  CO2 12*  GLUCOSE 496*  BUN 22*  CREATININE 1.49*  CALCIUM 10.3   Liver Function Tests: No results for input(s): AST, ALT, ALKPHOS, BILITOT, PROT, ALBUMIN in the last 168 hours. No results for input(s): LIPASE, AMYLASE in the last 168 hours. No results for input(s): AMMONIA in the last 168 hours. CBC:  Recent Labs Lab 04/24/15 1158  WBC 17.3*  HGB 11.3*  HCT 34.9*  MCV 85.7  PLT 279   Cardiac Enzymes: No results for input(s): CKTOTAL, CKMB, CKMBINDEX, TROPONINI in the last 168 hours.  BNP (last 3 results) No results for input(s): BNP in the last 8760 hours.  ProBNP (last 3 results) No results for input(s): PROBNP in the last 8760 hours.  CBG:  Recent Labs Lab 04/24/15 1048  GLUCAP 477*    Radiological Exams on Admission: No results found.  EKG: Independently reviewed sinus rhythm  Assessment/Plan Active Problems:   DKA (diabetic ketoacidoses) (HCC)   GERD (gastroesophageal reflux disease)   Systolic murmur   Normocytic anemia   Pulmonary hypertension (HCC)   Acute encephalopathy   AKI (acute kidney injury) (HCC)   Hematemesis   Hypokalemia   1. DKA. Likely related to disuse of pump by patient and caregiver. Spoke to her on who is her caregiver but does not stay with her all day he reports yesterday morning her blood sugar was 500 and the tubing was disconnected. He gave her bolus Lantus and by the end of the day her sugar had come down to 195. However this morning once again injection site had come dislodged and her blood sugar was greater than 500. Chart review indicates in September she was taken off her insulin pump but that was reinstated when she was discharged from the rehabilitation center to home.  -Admit to step  down -DKA protocol -Transition to 10 units of Lantus with sliding scale when indicated per protocol. -Potassium runs 2 as her potassium levels 3.4 on admission. -Diabetic coordinator consult.  [facility]'s office confirmed she was put back on pump when discahrged from [facility] to home. Concern that she and/or care giver capable of managing pump/problem solving regarding pump.  #2.Hematemesis. 2 episodes. No NSAID or aspirin use that I can tell at this point. Spoke to her on the caregiver who said she had multiple episodes of the same yesterday. -Continue Protonix IV every 12. -Serial CBCs -Type and cross anticipation of transfusion -Monitor -GI consult  #3. Anemia. Normocytic. Hemoglobin on admission 11.3 this is higher than baseline. Suspect concentrated. -Serial CBC -FOBT -GI consult  #4. Acute encephalopathy. Suspect underlying dementia. Likely related to #1 -Continue Aricept -If no improvement consider CT -Spoke to her on who is the caregiver but does not spend all day with her. He checks her blood sugar administers her insulin. He reports at baseline she has memory issues and problem solving issues but when she is hyperglycemic this is even worse  #5. Acute kidney injury. Likely related to #1. -IV fluids as noted above. -Hold nephrotoxins. -Monitor intake and output -recheck in am  Blase Mess Code Status: full DVT Prophylaxis: Family Communication: spoke to ron who is caregiver and helps with meds Disposition Plan: may need placement  Time spent: 70 minutes  [facility]

## 2015-04-24 NOTE — ED Notes (Signed)
Pt states it has been hard maintaining her blood sugar and it has been reading High for 3 day. Her CBG is 540 Pt states eating and drinking normal.  Pt states she has lost 15 lbs in 2 months without trying.

## 2015-04-24 NOTE — ED Notes (Signed)
  While rounding, found pt naked with her 2 IV's pulled out and new onset of confusion.  Restarted IV, MD notified.

## 2015-04-24 NOTE — ED Notes (Signed)
Pt states she had been vomiting dark coffee ground emesis for 3 days.  Denies any pain or injury.  Pt also states hyperglycemic for 3 days.  Gems: CBG 540, 142/64, 100 pulse, 100% RA, hx TIA with right side deficent.  Slow to respond to questions.

## 2015-04-24 NOTE — ED Provider Notes (Signed)
CSN: [id number]     Arrival date & time 04/24/15  1012 History   First MD Initiated Contact with Patient 04/24/15 1028     Chief Complaint  Patient presents with  . Hyperglycemia  . Hematemesis     (Consider location/radiation/quality/duration/timing/severity/associated sxs/prior Treatment) HPI   74 year old female with hyperglycemia. Presenting with husband who provides some additional history. Blood sugars have been high for the past several days. Several readings the 500s. Histamine change in appetite. Husband says she has been compliant with her medicines. Nausea and vomiting for 2-3 days. Very dark in color. Patient denies any pain. She does say she feels tired, thirsty and nauseated though. Denies any acute pain.  Past Medical History  Diagnosis Date  . Coronary artery disease   . Depression   . Retinopathy   . Anxiety   . Heart murmur   . TIA (transient ischemic attack)   . Arthritis     "fingers" (12/23/2014)  . Fibromyalgia   . Type I diabetes mellitus (HCC) dx'd 1953    "I'm on a pup" (12/23/2014)  . Chronic lower back pain   . GERD (gastroesophageal reflux disease) dx'd 1995  . Osteopenia dx'd 2007   Past Surgical History  Procedure Laterality Date  . Cataract extraction w/ intraocular lens  implant, bilateral Bilateral 1998  . Tonsillectomy    . Appendectomy    . Bilateral carpal tunnel release Bilateral 2003-2004  . Back surgery    . Anterior cervical decomp/discectomy fusion  05/2003    "C5-6"  . Abdominal hysterectomy  1972  . Dilation and curettage of uterus    . Cesarean section    . Eye surgery Bilateral since 1977    "numerous slaser surgeries for retinopathy"  . Trigger finger release Bilateral 2003  . Cervical disc surgery  2004    "collapsed C-5"  . Fracture surgery    . Patella fracture surgery Left 04/2007  . Vertebroplasty  01/2008    "compressed lumbar fracture"  . Laparoscopic lysis of adhesions  1975-1987 X 5  . Exploratory laparotomy   [id number] X 1    "adhesions"  . Oophorectomy Bilateral 1980's  . Vitrectomy Right 08/1997  . Bunionectomy with hammertoe reconstruction Right 02/1999  . Coronary angioplasty with stent placement  07/1999    "?1"  . Lumbar microdiscectomy  03/2005  . Orif ankle fracture Left 12/25/2014    Procedure: OPEN REDUCTION INTERNAL FIXATION (ORIF) LEFT TRIMALLEOLAR ANKLE FRACTURE;  Surgeon: Tarry KosNaiping M Xu, MD;  Location: [facility] OR;  Service: Orthopedics;  Laterality: Left;   Family History  Problem Relation Age of Onset  . Liver disease Sister   . Colon cancer Brother    Social History  Substance Use Topics  . Smoking status: Current Every Day Smoker -- 1.00 packs/day for 27 years    Types: Cigarettes  . Smokeless tobacco: Never Used     Comment: "quit smoking cigarettes in 1985  . Alcohol Use: No   OB History    No data available     Review of Systems All systems reviewed and negative, other than as noted in HPI.    Allergies  Diclofenac sodium; Doxycycline calcium; Codeine; and Zocor  Home Medications   Prior to Admission medications   Medication Sig Start Date End Date Taking? Authorizing Provider  amLODipine (NORVASC) 5 MG tablet Take 1 tablet (5 mg total) by mouth daily. 01/01/15  Yes Rodolph Bonganiel Thompson V, MD  ARIPiprazole (ABILIFY) 5 MG tablet Take 5 mg by mouth  daily.   Yes Historical Provider, MD  atorvastatin (LIPITOR) 20 MG tablet Take 20 mg by mouth daily.   Yes Historical Provider, MD  Cholecalciferol (VITAMIN D3) 2000 UNITS TABS Take by mouth daily. For supplement   Yes Historical Provider, MD  docusate sodium (COLACE) 100 MG capsule Take 100 mg by mouth 2 (two) times daily.   Yes Historical Provider, MD  donepezil (ARICEPT) 10 MG tablet Take 10 mg by mouth at bedtime.   Yes Historical Provider, MD  ferrous sulfate 325 (65 FE) MG tablet Take 325 mg by mouth 2 (two) times daily.   Yes Historical Provider, MD  Insulin Human (INSULIN PUMP) SOLN Inject 4-5 each into the skin 3 times  daily with meals, bedtime and 2 AM. Humalog. 12am:4 units                  9am: 5 units                  5pm: 4 units   Yes Historical Provider, MD  lamoTRIgine (LAMICTAL) 100 MG tablet Take 50 mg by mouth daily.   Yes Historical Provider, MD  LORazepam (ATIVAN) 0.5 MG tablet Take 0.5 mg by mouth 3 (three) times daily as needed. Anxiety   Yes Historical Provider, MD  Multiple Vitamin (MULITIVITAMIN WITH MINERALS) TABS Take 1 tablet by mouth daily.   Yes Historical Provider, MD  omeprazole (PRILOSEC) 20 MG capsule Take 40 mg by mouth daily.   Yes Historical Provider, MD  prazosin (MINIPRESS) 1 MG capsule Take 1 mg by mouth daily.   Yes Historical Provider, MD  rosuvastatin (CRESTOR) 5 MG tablet Take 5 mg by mouth daily.   Yes Historical Provider, MD  sertraline (ZOLOFT) 100 MG tablet Take 150 mg by mouth daily.   Yes Historical Provider, MD  acetaminophen (TYLENOL) 325 MG tablet Take 2 tablets (650 mg total) by mouth every 6 (six) hours as needed for mild pain (or Fever >/= 101). 12/27/14   Christiane Ha, MD  ARIPiprazole (ABILIFY) 2 MG tablet Take 2 mg by mouth at bedtime.     Historical Provider, MD  aspirin EC 325 MG tablet Take 325 mg by mouth daily.    Historical Provider, MD  insulin aspart (NOVOLOG) 100 UNIT/ML injection Before each meal 3 times a day, 140-199 - 2 units, 200-250 - 4 units, 251-299 - 6 units,  300-349 - 8 units,  350 or above 10 units. Dispense syringes and needles as needed, Ok to switch to PEN if approved. Substitute to any brand approved. DX DM2, Code E11.65.   You can continue to use previous supply of Novolog/Humolog at this dose Patient not taking: Reported on 04/24/2015 02/27/15   Leroy Sea, MD  insulin glargine (LANTUS) 100 UNIT/ML injection Inject 0.12 mLs (12 Units total) into the skin at bedtime. Patient not taking: Reported on 04/24/2015 02/27/15   Leroy Sea, MD  polyethylene glycol [facility] / Ethelene Hal) packet Take 17 g by mouth daily as needed for  mild constipation. 12/27/14   Christiane Ha, MD  Protein (PROCEL) POWD Take 1 scoop by mouth 2 (two) times daily.    Historical Provider, MD   BP 164/64 mmHg  Pulse 98  Temp(Src) 98 F (36.7 C) (Axillary)  Resp 24  Ht 5\' 2"  (1.575 m)  Wt 72 lb (32.659 kg)  BMI 13.17 kg/m2  SpO2 100% Physical Exam  Constitutional:  Frail appearing  HENT:  Head: Normocephalic and atraumatic.  Eyes: Conjunctivae  are normal. Right eye exhibits no discharge. Left eye exhibits no discharge.  Neck: Neck supple.  Cardiovascular: Regular rhythm and normal heart sounds.  Exam reveals no gallop and no friction rub.   No murmur heard. Mild tachycardia  Pulmonary/Chest: Effort normal and breath sounds normal. No respiratory distress.  Abdominal: Soft. She exhibits no distension. There is no tenderness.  Musculoskeletal: She exhibits no edema or tenderness.  Neurological: She is alert. No cranial nerve deficit. She exhibits normal muscle tone.  Awake. Slow to respond to questions. Follows commands.   Skin: Skin is warm and dry. She is not diaphoretic.  Nursing note and vitals reviewed.   ED Course  Procedures (including critical care time)  CRITICAL CARE Performed by: Raeford Razor Total critical care time: 35 minutes Critical care time was exclusive of separately billable procedures and treating other patients. Critical care was necessary to treat or prevent imminent or life-threatening deterioration. Critical care was time spent personally by me on the following activities: development of treatment plan with patient and/or surrogate as well as nursing, discussions with consultants, evaluation of patient's response to treatment, examination of patient, obtaining history from patient or surrogate, ordering and performing treatments and interventions, ordering and review of laboratory studies, ordering and review of radiographic studies, pulse oximetry and re-evaluation of patient's condition.  Labs  Review Labs Reviewed  BASIC METABOLIC PANEL - Abnormal; Notable for the following:    Potassium 3.4 (*)    CO2 12 (*)    Glucose, Bld 496 (*)    BUN 22 (*)    Creatinine, Ser 1.49 (*)    GFR calc non Af Amer 34 (*)    GFR calc Af Amer 39 (*)    Anion gap 24 (*)    All other components within normal limits  CBC - Abnormal; Notable for the following:    WBC 17.3 (*)    Hemoglobin 11.3 (*)    HCT 34.9 (*)    All other components within normal limits  URINALYSIS, ROUTINE W REFLEX MICROSCOPIC (NOT AT [facility]) - Abnormal; Notable for the following:    APPearance CLOUDY (*)    Glucose, UA >1000 (*)    Hgb urine dipstick TRACE (*)    Ketones, ur >80 (*)    All other components within normal limits  URINE MICROSCOPIC-ADD ON - Abnormal; Notable for the following:    Squamous Epithelial / LPF 0-5 (*)    Casts HYALINE CASTS (*)    All other components within normal limits  CBG MONITORING, ED - Abnormal; Notable for the following:    Glucose-Capillary 477 (*)    All other components within normal limits  POC OCCULT BLOOD, ED  TYPE AND SCREEN    Imaging Review No results found. I have personally reviewed and evaluated these images and lab results as part of my medical decision-making.   EKG Interpretation   Date/Time:  Thursday April 24 2015 10:27:21 EST Ventricular Rate:  98 PR Interval:  149 QRS Duration: 91 QT Interval:  423 QTC Calculation: 540 R Axis:   146 Text Interpretation:  Sinus rhythm Consider anterior infarct Non-specific  ST-t changes Confirmed by Juleen China  MD, Mikalah Skyles ([id number]) on 04/24/2015 11:15:50  AM      MDM   Final diagnoses:  Diabetic ketoacidosis without coma associated with type 1 diabetes mellitus (HCC)    74 year old female with nausea and vomiting. She is in diabetic ketoacidosis. Blood glucose 500. Metabolic acidosis with an increased anion gap. Ketones on urinalysis.  Typical symptoms. Not clear what precipitant is. No clear infectious symptoms.  Afebrile. Not most reliable historian, but denies any acute pain.  EKG w/o overt ischemic changes.  IV fluids. Will start potassium runs. Insulin drip. Additionally, coffee ground emesis. Hemoglobin above baseline, but likely hemoconcentration. Will need to be monitored. Protonix. Admission.     Raeford Razor, MD 04/30/15 [phone]

## 2015-04-25 ENCOUNTER — Inpatient Hospital Stay (HOSPITAL_COMMUNITY): Payer: Medicare Other

## 2015-04-25 DIAGNOSIS — R110 Nausea: Secondary | ICD-10-CM

## 2015-04-25 DIAGNOSIS — N179 Acute kidney failure, unspecified: Secondary | ICD-10-CM

## 2015-04-25 DIAGNOSIS — K219 Gastro-esophageal reflux disease without esophagitis: Secondary | ICD-10-CM

## 2015-04-25 DIAGNOSIS — E1010 Type 1 diabetes mellitus with ketoacidosis without coma: Principal | ICD-10-CM

## 2015-04-25 DIAGNOSIS — G9340 Encephalopathy, unspecified: Secondary | ICD-10-CM

## 2015-04-25 DIAGNOSIS — K920 Hematemesis: Secondary | ICD-10-CM

## 2015-04-25 DIAGNOSIS — I272 Other secondary pulmonary hypertension: Secondary | ICD-10-CM

## 2015-04-25 LAB — CBC
HEMATOCRIT: 29.4 % — AB (ref 36.0–46.0)
HEMOGLOBIN: 9.6 g/dL — AB (ref 12.0–15.0)
MCH: 27.6 pg (ref 26.0–34.0)
MCHC: 32.7 g/dL (ref 30.0–36.0)
MCV: 84.5 fL (ref 78.0–100.0)
Platelets: 233 10*3/uL (ref 150–400)
RBC: 3.48 MIL/uL — AB (ref 3.87–5.11)
RDW: 15.2 % (ref 11.5–15.5)
WBC: 14.8 10*3/uL — ABNORMAL HIGH (ref 4.0–10.5)

## 2015-04-25 LAB — BASIC METABOLIC PANEL
ANION GAP: 12 (ref 5–15)
BUN: 16 mg/dL (ref 6–20)
CHLORIDE: 110 mmol/L (ref 101–111)
CO2: 18 mmol/L — AB (ref 22–32)
Calcium: 9.8 mg/dL (ref 8.9–10.3)
Creatinine, Ser: 1.05 mg/dL — ABNORMAL HIGH (ref 0.44–1.00)
GFR calc Af Amer: 60 mL/min — ABNORMAL LOW (ref >60)
GFR calc non Af Amer: 51 mL/min — ABNORMAL LOW (ref >60)
GLUCOSE: 204 mg/dL — AB (ref 65–99)
POTASSIUM: 3.7 mmol/L (ref 3.5–5.1)
SODIUM: 140 mmol/L (ref 135–145)

## 2015-04-25 LAB — GLUCOSE, CAPILLARY
GLUCOSE-CAPILLARY: 134 mg/dL — AB (ref 65–99)
GLUCOSE-CAPILLARY: 157 mg/dL — AB (ref 65–99)
GLUCOSE-CAPILLARY: 177 mg/dL — AB (ref 65–99)
GLUCOSE-CAPILLARY: 188 mg/dL — AB (ref 65–99)
GLUCOSE-CAPILLARY: 239 mg/dL — AB (ref 65–99)
GLUCOSE-CAPILLARY: 247 mg/dL — AB (ref 65–99)
Glucose-Capillary: 254 mg/dL — ABNORMAL HIGH (ref 65–99)

## 2015-04-25 LAB — TROPONIN I: Troponin I: 0.07 ng/mL — ABNORMAL HIGH (ref <0.031)

## 2015-04-25 MED ORDER — INSULIN GLARGINE 100 UNIT/ML ~~LOC~~ SOLN
10.0000 [IU] | Freq: Every day | SUBCUTANEOUS | Status: DC
  Administered 2015-04-25 (×2): 10 [IU] via SUBCUTANEOUS
  Filled 2015-04-25 (×2): qty 0.1

## 2015-04-25 MED ORDER — SODIUM CHLORIDE 0.9 % IV SOLN
INTRAVENOUS | Status: DC
  Administered 2015-04-25: 02:00:00 via INTRAVENOUS

## 2015-04-25 MED ORDER — INSULIN ASPART 100 UNIT/ML ~~LOC~~ SOLN
0.0000 [IU] | Freq: Three times a day (TID) | SUBCUTANEOUS | Status: DC
  Administered 2015-04-25: 5 [IU] via SUBCUTANEOUS
  Administered 2015-04-25 – 2015-04-26 (×3): 3 [IU] via SUBCUTANEOUS
  Administered 2015-04-26: 2 [IU] via SUBCUTANEOUS
  Administered 2015-04-26: 5 [IU] via SUBCUTANEOUS
  Administered 2015-04-27: 7 [IU] via SUBCUTANEOUS
  Administered 2015-04-27: 2 [IU] via SUBCUTANEOUS
  Administered 2015-04-27 – 2015-04-28 (×3): 3 [IU] via SUBCUTANEOUS

## 2015-04-25 MED ORDER — GLUCERNA SHAKE PO LIQD
237.0000 mL | Freq: Three times a day (TID) | ORAL | Status: DC
  Administered 2015-04-25 – 2015-04-28 (×7): 237 mL via ORAL

## 2015-04-25 NOTE — Progress Notes (Addendum)
Inpatient Diabetes Program Recommendations  AACE/ADA: New Consensus Statement on Inpatient Glycemic Control (2015)  Target Ranges:  Prepandial:   less than 140 mg/dL      Peak postprandial:   less than 180 mg/dL (1-2 hours)      Critically ill patients:  140 - 180 mg/dL   Review of Glycemic Control  Diabetes history: type 1 Outpatient Diabetes medications: lantus 10 units and SSI Novolog starts with 2 units at 140 mg/dl, 4 units at 161200 mg/dL and 2 unit increments per 50 mg/dl increase  Current orders for Inpatient glycemic control: Sensitive correction tidwc and lantus 10 units daily  Inpatient Diabetes Program Recommendations: Consult received regarding insulin pump malfunctioning and admission with DKA. I am very familiar with this patient for many years. Patient was using an insulin pump, but with any cognitive problems, the pump would be very dangerous for her to use. (The pump is only as good as the person operating it and adjusting basals/boluses). Will see patient, however I would agree that lantus and novolog is the safest regimen for her at this time.Have requested a social work consult as it appears that patient lives alone. Not sure if patient is safe living alone.  Thank you Lenor CoffinAnn Jarelle Ates, RN, MSN, CDE  Diabetes Inpatient Program Office: [phone] Pager: [phone] 8:00 am to 5:00 pm     Ad-pt states she does not live alone but she lives with her husband and is expecting him to visit soon. Also noted that patient will have HH services at discharge. Thank you Lenor CoffinAnn Deeanna Beightol, RN, MSN, CDE  Diabetes Inpatient Program Office: [phone] Pager: [phone] 8:00 am to 5:00 pm

## 2015-04-25 NOTE — Consult Note (Signed)
[facility] Consult Note  Referring Provider: No ref. provider found Primary Care Physician:  Sheela Stack, MD Primary Gastroenterologist:  Dr.  Laurel Dimmer Complaint: Reported coffee-ground emesis HPI: Beth Payne is an 74 y.o. black female  admitted with DKA. She reportedly had coffee-ground emesis which she does not remember. She is sitting up eating a full breakfast this morning. She has had Hemoccults ordered and these are pending. She had an EGD and colonoscopy in 2008 which were normal.  Past Medical History  Diagnosis Date  . Coronary artery disease   . Depression   . Retinopathy   . Anxiety   . Heart murmur   . TIA (transient ischemic attack)   . Arthritis     "fingers" (12/23/2014)  . Fibromyalgia   . Type I diabetes mellitus ([place]) dx'd 1953    "I'm on a pup" (12/23/2014)  . Chronic lower back pain   . GERD (gastroesophageal reflux disease) dx'd 1995  . Osteopenia dx'd 2007    Past Surgical History  Procedure Laterality Date  . Cataract extraction w/ intraocular lens  implant, bilateral Bilateral 1998  . Tonsillectomy    . Appendectomy    . Bilateral carpal tunnel release Bilateral 2003-2004  . Back surgery    . Anterior cervical decomp/discectomy fusion  05/2003    "C5-6"  . Abdominal hysterectomy  1972  . Dilation and curettage of uterus    . Cesarean section    . Eye surgery Bilateral since 1977    "numerous slaser surgeries for retinopathy"  . Trigger finger release Bilateral 2003  . Cervical disc surgery  2004    "collapsed C-5"  . Fracture surgery    . Patella fracture surgery Left 04/2007  . Vertebroplasty  01/2008    "compressed lumbar fracture"  . Laparoscopic lysis of adhesions  1975-1987 X 5  . Exploratory laparotomy  4193-7902 X 1    "adhesions"  . Oophorectomy Bilateral 1980's  . Vitrectomy Right 08/1997  . Bunionectomy with hammertoe reconstruction Right 02/1999  . Coronary angioplasty with stent placement  07/1999    "?1"   . Lumbar microdiscectomy  03/2005  . Orif ankle fracture Left 12/25/2014    Procedure: OPEN REDUCTION INTERNAL FIXATION (ORIF) LEFT TRIMALLEOLAR ANKLE FRACTURE;  Surgeon: Leandrew Koyanagi, MD;  Location: [place];  Service: Orthopedics;  Laterality: Left;    Medications Prior to Admission  Medication Sig Dispense Refill  . amLODipine (NORVASC) 5 MG tablet Take 1 tablet (5 mg total) by mouth daily. 30 tablet 0  . ARIPiprazole (ABILIFY) 5 MG tablet Take 5 mg by mouth daily.    Marland Kitchen atorvastatin (LIPITOR) 20 MG tablet Take 20 mg by mouth daily.    . Cholecalciferol (VITAMIN D3) 2000 UNITS TABS Take by mouth daily. For supplement    . docusate sodium (COLACE) 100 MG capsule Take 100 mg by mouth 2 (two) times daily.    Marland Kitchen donepezil (ARICEPT) 10 MG tablet Take 10 mg by mouth at bedtime.    . ferrous sulfate 325 (65 FE) MG tablet Take 325 mg by mouth 2 (two) times daily.    Marland Kitchen lamoTRIgine (LAMICTAL) 100 MG tablet Take 50 mg by mouth daily.    Marland Kitchen LORazepam (ATIVAN) 0.5 MG tablet Take 0.5 mg by mouth 3 (three) times daily as needed. Anxiety    . Multiple Vitamin (MULITIVITAMIN WITH MINERALS) TABS Take 1 tablet by mouth daily.    Marland Kitchen omeprazole (PRILOSEC) 20 MG capsule Take 40 mg by mouth daily.    Marland Kitchen  prazosin (MINIPRESS) 1 MG capsule Take 1 mg by mouth daily.    . rosuvastatin (CRESTOR) 5 MG tablet Take 5 mg by mouth daily.    . sertraline (ZOLOFT) 100 MG tablet Take 150 mg by mouth daily.    Marland Kitchen acetaminophen (TYLENOL) 325 MG tablet Take 2 tablets (650 mg total) by mouth every 6 (six) hours as needed for mild pain (or Fever >/= 101).    . ARIPiprazole (ABILIFY) 2 MG tablet Take 2 mg by mouth at bedtime.     Marland Kitchen aspirin EC 325 MG tablet Take 325 mg by mouth daily.    . insulin aspart (NOVOLOG) 100 UNIT/ML injection Before each meal 3 times a day, 140-199 - 2 units, 200-250 - 4 units, 251-299 - 6 units,  300-349 - 8 units,  350 or above 10 units. Dispense syringes and needles as needed, Ok to switch to PEN if approved.  Substitute to any brand approved. DX DM2, Code E11.65.   You can continue to use previous supply of Novolog/Humolog at this dose (Patient not taking: Reported on 04/24/2015) 1 vial 12  . insulin glargine (LANTUS) 100 UNIT/ML injection Inject 0.12 mLs (12 Units total) into the skin at bedtime. (Patient not taking: Reported on 04/24/2015) 10 mL 11  . polyethylene glycol (MIRALAX / GLYCOLAX) packet Take 17 g by mouth daily as needed for mild constipation. 14 each 0  . Protein (PROCEL) POWD Take 1 scoop by mouth 2 (two) times daily.      Allergies:  Allergies  Allergen Reactions  . Diclofenac Sodium Swelling and Other (See Comments)    Blurred vision  . Doxycycline Calcium Swelling and Other (See Comments)    Blurred vision  . Codeine Nausea And Vomiting and Other (See Comments)    Dizziness   . Zocor [Simvastatin] Other (See Comments)    Dizziness, weakness     Family History  Problem Relation Age of Onset  . Liver disease Sister   . Colon cancer Brother     Social History:  reports that she has been smoking Cigarettes.  She has a 27 pack-year smoking history. She has never used smokeless tobacco. She reports that she does not drink alcohol or use illicit drugs.  Review of Systems: negative except as above   Blood pressure 137/78, pulse 110, temperature 98.2 F (36.8 C), temperature source Oral, resp. rate 20, height 5' 2"  (1.575 m), weight 43.5 kg (95 lb 14.4 oz), SpO2 94 %. Head: Normocephalic, without obvious abnormality, atraumatic Neck: no adenopathy, no carotid bruit, no JVD, supple, symmetrical, trachea midline and thyroid not enlarged, symmetric, no tenderness/mass/nodules Resp: clear to auscultation bilaterally Cardio: regular rate and rhythm, S1, S2 normal, no murmur, click, rub or gallop GI: Abdomen soft nondistended with normoactive bowel sounds. No hepatomegaly masses or guarding. Extremities: extremities normal, atraumatic, no cyanosis or edema  Results for orders  placed or performed during the hospital encounter of 04/24/15 (from the past 48 hour(s))  CBG monitoring, ED     Status: Abnormal   Collection Time: 04/24/15 10:48 AM  Result Value Ref Range   Glucose-Capillary 477 (H) 65 - 99 mg/dL  Urinalysis, Routine w reflex microscopic (not at [facility])     Status: Abnormal   Collection Time: 04/24/15 11:02 AM  Result Value Ref Range   Color, Urine YELLOW YELLOW   APPearance CLOUDY (A) CLEAR   Specific Gravity, Urine 1.022 1.005 - 1.030   pH 5.0 5.0 - 8.0   Glucose, UA >1000 (A) NEGATIVE  mg/dL   Hgb urine dipstick TRACE (A) NEGATIVE   Bilirubin Urine NEGATIVE NEGATIVE   Ketones, ur >80 (A) NEGATIVE mg/dL   Protein, ur NEGATIVE NEGATIVE mg/dL   Nitrite NEGATIVE NEGATIVE   Leukocytes, UA NEGATIVE NEGATIVE  Urine microscopic-add on     Status: Abnormal   Collection Time: 04/24/15 11:02 AM  Result Value Ref Range   Squamous Epithelial / LPF 0-5 (A) NONE SEEN    Comment: Please note change in reference range.   WBC, UA 0-5 0 - 5 WBC/hpf    Comment: Please note change in reference range.   RBC / HPF 0-5 0 - 5 RBC/hpf    Comment: Please note change in reference range.   Bacteria, UA NONE SEEN NONE SEEN    Comment: Please note change in reference range.   Casts HYALINE CASTS (A) NEGATIVE  Type and screen [place]     Status: None   Collection Time: 04/24/15 11:48 AM  Result Value Ref Range   ABO/RH(D) AB POS    Antibody Screen NEG    Sample Expiration 16/38/4665   Basic metabolic panel     Status: Abnormal   Collection Time: 04/24/15 11:58 AM  Result Value Ref Range   Sodium 138 135 - 145 mmol/L   Potassium 3.4 (L) 3.5 - 5.1 mmol/L   Chloride 102 101 - 111 mmol/L   CO2 12 (L) 22 - 32 mmol/L   Glucose, Bld 496 (H) 65 - 99 mg/dL   BUN 22 (H) 6 - 20 mg/dL   Creatinine, Ser 1.49 (H) 0.44 - 1.00 mg/dL   Calcium 10.3 8.9 - 10.3 mg/dL   GFR calc non Af Amer 34 (L) >60 mL/min   GFR calc Af Amer 39 (L) >60 mL/min    Comment:  (NOTE) The eGFR has been calculated using the CKD EPI equation. This calculation has not been validated in all clinical situations. eGFR's persistently <60 mL/min signify possible Chronic Kidney Disease.    Anion gap 24 (H) 5 - 15  CBC     Status: Abnormal   Collection Time: 04/24/15 11:58 AM  Result Value Ref Range   WBC 17.3 (H) 4.0 - 10.5 K/uL   RBC 4.07 3.87 - 5.11 MIL/uL   Hemoglobin 11.3 (L) 12.0 - 15.0 g/dL   HCT 34.9 (L) 36.0 - 46.0 %   MCV 85.7 78.0 - 100.0 fL   MCH 27.8 26.0 - 34.0 pg   MCHC 32.4 30.0 - 36.0 g/dL   RDW 15.2 11.5 - 15.5 %   Platelets 279 150 - 400 K/uL  Troponin I     Status: Abnormal   Collection Time: 04/24/15  1:00 PM  Result Value Ref Range   Troponin I 0.04 (H) <0.031 ng/mL    Comment:        PERSISTENTLY INCREASED TROPONIN VALUES IN THE RANGE OF 0.04-0.49 ng/mL CAN BE SEEN IN:       -UNSTABLE ANGINA       -CONGESTIVE HEART FAILURE       -MYOCARDITIS       -CHEST TRAUMA       -ARRYHTHMIAS       -LATE PRESENTING MYOCARDIAL INFARCTION       -COPD   CLINICAL FOLLOW-UP RECOMMENDED.   CBG monitoring, ED     Status: Abnormal   Collection Time: 04/24/15  2:34 PM  Result Value Ref Range   Glucose-Capillary 368 (H) 65 - 99 mg/dL  Glucose, capillary  Status: Abnormal   Collection Time: 04/24/15  3:54 PM  Result Value Ref Range   Glucose-Capillary 357 (H) 65 - 99 mg/dL  Glucose, capillary     Status: Abnormal   Collection Time: 04/24/15  5:11 PM  Result Value Ref Range   Glucose-Capillary 317 (H) 65 - 99 mg/dL  MRSA PCR Screening     Status: None   Collection Time: 04/24/15  6:35 PM  Result Value Ref Range   MRSA by PCR NEGATIVE NEGATIVE    Comment:        The GeneXpert MRSA Assay (FDA approved for NASAL specimens only), is one component of a comprehensive MRSA colonization surveillance program. It is not intended to diagnose MRSA infection nor to guide or monitor treatment for MRSA infections.   Basic metabolic panel (stat then  every 4 hours)     Status: Abnormal   Collection Time: 04/24/15  6:38 PM  Result Value Ref Range   Sodium 144 135 - 145 mmol/L   Potassium 3.0 (L) 3.5 - 5.1 mmol/L   Chloride 111 101 - 111 mmol/L   CO2 20 (L) 22 - 32 mmol/L   Glucose, Bld 262 (H) 65 - 99 mg/dL   BUN 16 6 - 20 mg/dL   Creatinine, Ser 1.13 (H) 0.44 - 1.00 mg/dL   Calcium 9.8 8.9 - 10.3 mg/dL   GFR calc non Af Amer 47 (L) >60 mL/min   GFR calc Af Amer 54 (L) >60 mL/min    Comment: (NOTE) The eGFR has been calculated using the CKD EPI equation. This calculation has not been validated in all clinical situations. eGFR's persistently <60 mL/min signify possible Chronic Kidney Disease.    Anion gap 13 5 - 15  Troponin I     Status: Abnormal   Collection Time: 04/24/15  6:38 PM  Result Value Ref Range   Troponin I 0.07 (H) <0.031 ng/mL    Comment:        PERSISTENTLY INCREASED TROPONIN VALUES IN THE RANGE OF 0.04-0.49 ng/mL CAN BE SEEN IN:       -UNSTABLE ANGINA       -CONGESTIVE HEART FAILURE       -MYOCARDITIS       -CHEST TRAUMA       -ARRYHTHMIAS       -LATE PRESENTING MYOCARDIAL INFARCTION       -COPD   CLINICAL FOLLOW-UP RECOMMENDED.   CBC     Status: Abnormal   Collection Time: 04/24/15  6:38 PM  Result Value Ref Range   WBC 14.8 (H) 4.0 - 10.5 K/uL   RBC 3.82 (L) 3.87 - 5.11 MIL/uL   Hemoglobin 10.4 (L) 12.0 - 15.0 g/dL   HCT 32.3 (L) 36.0 - 46.0 %   MCV 84.6 78.0 - 100.0 fL   MCH 27.2 26.0 - 34.0 pg   MCHC 32.2 30.0 - 36.0 g/dL   RDW 15.3 11.5 - 15.5 %   Platelets 267 150 - 400 K/uL  Glucose, capillary     Status: Abnormal   Collection Time: 04/24/15  6:53 PM  Result Value Ref Range   Glucose-Capillary 249 (H) 65 - 99 mg/dL   Comment 1 Notify RN   Glucose, capillary     Status: Abnormal   Collection Time: 04/24/15  8:02 PM  Result Value Ref Range   Glucose-Capillary 168 (H) 65 - 99 mg/dL  Glucose, capillary     Status: Abnormal   Collection Time: 04/24/15  9:01 PM  Result Value  Ref Range    Glucose-Capillary 136 (H) 65 - 99 mg/dL  Glucose, capillary     Status: Abnormal   Collection Time: 04/24/15 10:14 PM  Result Value Ref Range   Glucose-Capillary 104 (H) 65 - 99 mg/dL  Basic metabolic panel (stat then every 4 hours)     Status: Abnormal   Collection Time: 04/24/15 11:08 PM  Result Value Ref Range   Sodium 143 135 - 145 mmol/L   Potassium 4.1 3.5 - 5.1 mmol/L    Comment: DELTA CHECK NOTED   Chloride 111 101 - 111 mmol/L   CO2 19 (L) 22 - 32 mmol/L   Glucose, Bld 123 (H) 65 - 99 mg/dL   BUN 17 6 - 20 mg/dL   Creatinine, Ser 1.00 0.44 - 1.00 mg/dL   Calcium 10.1 8.9 - 10.3 mg/dL   GFR calc non Af Amer 55 (L) >60 mL/min   GFR calc Af Amer >60 >60 mL/min    Comment: (NOTE) The eGFR has been calculated using the CKD EPI equation. This calculation has not been validated in all clinical situations. eGFR's persistently <60 mL/min signify possible Chronic Kidney Disease.    Anion gap 13 5 - 15  Glucose, capillary     Status: Abnormal   Collection Time: 04/24/15 11:08 PM  Result Value Ref Range   Glucose-Capillary 117 (H) 65 - 99 mg/dL  Glucose, capillary     Status: Abnormal   Collection Time: 04/25/15 12:06 AM  Result Value Ref Range   Glucose-Capillary 134 (H) 65 - 99 mg/dL  Glucose, capillary     Status: Abnormal   Collection Time: 04/25/15  1:03 AM  Result Value Ref Range   Glucose-Capillary 157 (H) 65 - 99 mg/dL  Glucose, capillary     Status: Abnormal   Collection Time: 04/25/15  2:17 AM  Result Value Ref Range   Glucose-Capillary 188 (H) 65 - 99 mg/dL  Basic metabolic panel (stat then every 4 hours)     Status: Abnormal   Collection Time: 04/25/15  3:15 AM  Result Value Ref Range   Sodium 140 135 - 145 mmol/L   Potassium 3.7 3.5 - 5.1 mmol/L   Chloride 110 101 - 111 mmol/L   CO2 18 (L) 22 - 32 mmol/L   Glucose, Bld 204 (H) 65 - 99 mg/dL   BUN 16 6 - 20 mg/dL   Creatinine, Ser 1.05 (H) 0.44 - 1.00 mg/dL   Calcium 9.8 8.9 - 10.3 mg/dL   GFR calc non Af  Amer 51 (L) >60 mL/min   GFR calc Af Amer 60 (L) >60 mL/min    Comment: (NOTE) The eGFR has been calculated using the CKD EPI equation. This calculation has not been validated in all clinical situations. eGFR's persistently <60 mL/min signify possible Chronic Kidney Disease.    Anion gap 12 5 - 15  Troponin I     Status: Abnormal   Collection Time: 04/25/15  3:15 AM  Result Value Ref Range   Troponin I 0.07 (H) <0.031 ng/mL    Comment:        PERSISTENTLY INCREASED TROPONIN VALUES IN THE RANGE OF 0.04-0.49 ng/mL CAN BE SEEN IN:       -UNSTABLE ANGINA       -CONGESTIVE HEART FAILURE       -MYOCARDITIS       -CHEST TRAUMA       -ARRYHTHMIAS       -LATE PRESENTING MYOCARDIAL INFARCTION       -  COPD   CLINICAL FOLLOW-UP RECOMMENDED.   CBC     Status: Abnormal   Collection Time: 04/25/15  3:15 AM  Result Value Ref Range   WBC 14.8 (H) 4.0 - 10.5 K/uL   RBC 3.48 (L) 3.87 - 5.11 MIL/uL   Hemoglobin 9.6 (L) 12.0 - 15.0 g/dL   HCT 29.4 (L) 36.0 - 46.0 %   MCV 84.5 78.0 - 100.0 fL   MCH 27.6 26.0 - 34.0 pg   MCHC 32.7 30.0 - 36.0 g/dL   RDW 15.2 11.5 - 15.5 %   Platelets 233 150 - 400 K/uL   No results found.  Assessment: Reported coffee-ground emesis, heme positivity is emesis or stool not documented at this point. Plan:  Empiric PPI and await Hemoccults. We'll follow with you. Immaculate Crutcher C 04/25/2015, 8:32 AM  Pager [phone] If no answer or after 5 PM call [phone]

## 2015-04-25 NOTE — Evaluation (Addendum)
Physical Therapy Evaluation Patient Details Name: Beth Payne MRN: [id number] DOB: 11/23/1940 Today's Date: 04/25/2015   History of Present Illness  pt is a 74 y/o female with h/o DM and prior DKA's, CAD and HTN, admitted with confusion and elevated CBG's and DKA.  Clinical Impression  Pt admitted with/for confusion due to DKA.  Pt currently limited functionally due to the problems listed below.  (see problems list.)  Pt will benefit from PT to maximize function and safety to be able to get home safely with available assist..     Follow Up Recommendations Home health PT;Supervision/Assistance - 24 hour (must have 24 hour assist)    Equipment Recommendations  None recommended by PT    Recommendations for Other Services       Precautions / Restrictions Precautions Precautions: Fall      Mobility  Bed Mobility Overal bed mobility: Needs Assistance Bed Mobility: Supine to Sit     Supine to sit: Min assist        Transfers Overall transfer level: Needs assistance Equipment used: Rolling walker (2 wheeled) Transfers: Sit to/from Stand Sit to Stand: Min assist         General transfer comment: cues for hand placement  Ambulation/Gait Ambulation/Gait assistance: Min assist Ambulation Distance (Feet): 35 Feet Assistive device: Rolling walker (2 wheeled) Gait Pattern/deviations: Step-through pattern Gait velocity: slow Gait velocity interpretation: Below normal speed for age/gender General Gait Details: mildly unsteady and guarded overall.  Stairs            Wheelchair Mobility    Modified Rankin (Stroke Patients Only)       Balance Overall balance assessment: Needs assistance Sitting-balance support: Single extremity supported;No upper extremity supported Sitting balance-Leahy Scale: Fair       Standing balance-Leahy Scale: Poor Standing balance comment: reliant on the RW                             Pertinent Vitals/Pain Pain  Assessment: No/denies pain    Home Living Family/patient expects to be discharged to:: Private residence Living Arrangements: Non-relatives/Friends Available Help at Discharge: Family;Personal care attendant;Available PRN/intermittently Type of Home: House Home Access: Stairs to enter Entrance Stairs-Rails: Left;Right;Can reach both Entrance Stairs-Number of Steps: 6 Home Layout: One level Home Equipment: Shower seat;Bedside commode;Walker - 2 wheels;Walker - 4 wheels      Prior Function Level of Independence: Needs assistance      ADL's / Homemaking Assistance Needed: Pt states aide is assisting with bathing but she can dress herself.         Hand Dominance   Dominant Hand: Right    Extremity/Trunk Assessment   Upper Extremity Assessment: Generalized weakness           Lower Extremity Assessment: Generalized weakness         Communication   Communication: No difficulties  Cognition Arousal/Alertness: Awake/alert Behavior During Therapy: WFL for tasks assessed/performed Overall Cognitive Status: Impaired/Different from baseline                      General Comments General comments (skin integrity, edema, etc.): VSS    Exercises        Assessment/Plan    PT Assessment Patient needs continued PT services  PT Diagnosis Difficulty walking;Generalized weakness;Altered mental status   PT Problem List Decreased strength;Decreased activity tolerance;Decreased balance;Decreased mobility;Decreased cognition;Decreased safety awareness  PT Treatment Interventions DME instruction;Gait training;Functional mobility training;Therapeutic activities;Balance training;Patient/family  education;Stair training   PT Goals (Current goals can be found in the Care Plan section) Acute Rehab PT Goals Patient Stated Goal: unable PT Goal Formulation: Patient unable to participate in goal setting Time For Goal Achievement: 05/09/15 Potential to Achieve Goals: Good     Frequency Min 3X/week   Barriers to discharge        Co-evaluation               End of Session   Activity Tolerance: Patient tolerated treatment well;Patient limited by fatigue Patient left: in chair;with call bell/phone within reach;with chair alarm set Nurse Communication: Mobility status         Time: 1610-9604 PT Time Calculation (min) (ACUTE ONLY): 27 min   Charges:   PT Evaluation $Initial PT Evaluation Tier I: 1 Procedure PT Treatments $Gait Training: 8-22 mins   PT G Codes:        Maxima Skelton, Eliseo Gum 04/25/2015, 4:59 PM 04/25/2015  [place] Bing, PT [phone]  (pager)

## 2015-04-25 NOTE — Progress Notes (Addendum)
PATIENT DETAILS Name: Beth Payne Age: 74 y.o. Sex: female Date of Birth: 08/25/1940 Admit Date: 04/24/2015 Admitting Physician Costin Otelia SergeantM Gherghe, MD AVW:UJWJX,BJYNWGNPCP:SOUTH,STEPHEN Hessie DienerALAN, MD  Subjective: Mildly confused-But is following commands  Assessment/Plan: Active Problems: DKA (diabetic ketoacidoses): Resolved, off Insulin gtt-continue Lantus. Suspect malfunction of Insulin pump provoked DKA.   Type 1 DM: continue Lantus 10 units/SSI-follow and adjust accordingly  Transient Hematemesis: resolved-suspect could have had a small Mallory Weiss tear-GI following, continue PPI.  Leukocytosis: Likely secondary to stress margination-chest x-ray negative for pneumonia, UA negative for UTI. Monitor off antibiotics  AKI: Resolved. Secondary to DKA.  Minimally elevated troponins: Not in a pattern off ACS-EKG negative. Recent transthoracic echo in July 2016 without any regional wall motion abnormalities. Suspect no further workup required.  Hypertension: Stable-continue with amlodipine  Acute encephalopathy: Seems to have improved, suspect mostly usual baseline-has dementia at baseline.  Dementia: continue Aricept, Zolft and Abilify.  Pul FAO:ZHYQHTN:mild per last Echo-stable for outpatient workup  Disposition: Remain inpatient-transfer to med-surg-home in next 1-2 days  Antimicrobial agents  See below  Anti-infectives    None      DVT Prophylaxis: SCD's  Code Status: Full code   Family Communication None at bedside  Procedures: None  CONSULTS:  GI  Time spent 30 minutes-Greater than 50% of this time was spent in counseling, explanation of diagnosis, planning of further management, and coordination of care.  MEDICATIONS: Scheduled Meds: . amLODipine  5 mg Oral Daily  . ARIPiprazole  2 mg Oral QHS  . ARIPiprazole  5 mg Oral Daily  . docusate sodium  100 mg Oral BID  . donepezil  10 mg Oral QHS  . feeding supplement (GLUCERNA SHAKE)  237 mL Oral TID BM    . insulin aspart  0-9 Units Subcutaneous TID WC  . insulin glargine  10 Units Subcutaneous QHS  . lamoTRIgine  50 mg Oral Daily  . multivitamin with minerals  1 tablet Oral Daily  . pantoprazole (PROTONIX) IV  40 mg Intravenous Q12H  . prazosin  1 mg Oral Daily  . rosuvastatin  5 mg Oral Daily  . sertraline  150 mg Oral Daily   Continuous Infusions: . sodium chloride 40 mL/hr at 04/25/15 0929  . insulin (NOVOLIN-R) infusion Stopped (04/25/15 0218)   PRN Meds:.LORazepam, polyethylene glycol    PHYSICAL EXAM: Vital signs in last 24 hours: Filed Vitals:   04/25/15 0600 04/25/15 0829 04/25/15 0931 04/25/15 1129  BP: 137/78 156/71 164/84 132/59  Pulse: 110 92  102  Temp:  97.9 F (36.6 C)  98.1 F (36.7 C)  TempSrc:  Oral  Oral  Resp: 20 14  23   Height:      Weight:      SpO2: 94% 99%  98%    Weight change:  Filed Weights   04/24/15 1030 04/24/15 1521  Weight: 32.659 kg (72 lb) 43.5 kg (95 lb 14.4 oz)   Body mass index is 17.54 kg/(m^2).   Gen Exam: Awake -but pleasantly confused Neck: Supple, No JVD.   Chest: B/L Clear.   CVS: S1 S2 Regular, no murmurs.  Abdomen: soft, BS +, non tender, non distended.  Extremities: no edema, lower extremities warm to touch. Neurologic: Non Focal.   Skin: No Rash.   Wounds: N/A.   Intake/Output from previous day:  Intake/Output Summary (Last 24 hours) at 04/25/15 1217 Last data filed at 04/25/15 1100  Gross per  24 hour  Intake 1298.17 ml  Output      0 ml  Net 1298.17 ml     LAB RESULTS: CBC  Recent Labs Lab 04/24/15 1158 04/24/15 1838 04/25/15 0315  WBC 17.3* 14.8* 14.8*  HGB 11.3* 10.4* 9.6*  HCT 34.9* 32.3* 29.4*  PLT 279 267 233  MCV 85.7 84.6 84.5  MCH 27.8 27.2 27.6  MCHC 32.4 32.2 32.7  RDW 15.2 15.3 15.2    Chemistries   Recent Labs Lab 04/24/15 1158 04/24/15 1838 04/24/15 2308 04/25/15 0315  NA 138 144 143 140  K 3.4* 3.0* 4.1 3.7  CL 102 111 111 110  CO2 12* 20* 19* 18*  GLUCOSE 496*  262* 123* 204*  BUN 22* CREATININE 1.49* 1.13* 1.00 1.05*  CALCIUM 10.3 9.8 10.1 9.8    CBG:  Recent Labs Lab 04/24/15 2214 04/24/15 2308 04/25/15 0006 04/25/15 0103 04/25/15 0217  GLUCAP 104* 117* 134* 157* 188*    GFR Estimated Creatinine Clearance: 32.8 mL/min (by C-G formula based on Cr of 1.05).  Coagulation profile No results for input(s): INR, PROTIME in the last 168 hours.  Cardiac Enzymes  Recent Labs Lab 04/24/15 1300 04/24/15 1838 04/25/15 0315  TROPONINI 0.04* 0.07* 0.07*    Invalid input(s): POCBNP No results for input(s): DDIMER in the last 72 hours. No results for input(s): HGBA1C in the last 72 hours. No results for input(s): CHOL, HDL, LDLCALC, TRIG, CHOLHDL, LDLDIRECT in the last 72 hours. No results for input(s): TSH, T4TOTAL, T3FREE, THYROIDAB in the last 72 hours.  Invalid input(s): FREET3 No results for input(s): VITAMINB12, FOLATE, FERRITIN, TIBC, IRON, RETICCTPCT in the last 72 hours. No results for input(s): LIPASE, AMYLASE in the last 72 hours.  Urine Studies No results for input(s): UHGB, CRYS in the last 72 hours.  Invalid input(s): UACOL, UAPR, USPG, UPH, UTP, UGL, UKET, UBIL, UNIT, UROB, ULEU, UEPI, UWBC, URBC, UBAC, CAST, UCOM, BILUA  MICROBIOLOGY: Recent Results (from the past 240 hour(s))  MRSA PCR Screening     Status: None   Collection Time: 04/24/15  6:35 PM  Result Value Ref Range Status   MRSA by PCR NEGATIVE NEGATIVE Final    Comment:        The GeneXpert MRSA Assay (FDA approved for NASAL specimens only), is one component of a comprehensive MRSA colonization surveillance program. It is not intended to diagnose MRSA infection nor to guide or monitor treatment for MRSA infections.     RADIOLOGY STUDIES/RESULTS: Dg Chest Port 1 View  04/25/2015  CLINICAL DATA:  Leukocytosis EXAM: PORTABLE CHEST 1 VIEW COMPARISON:  02/23/2015 FINDINGS: Cardiomediastinal silhouette is stable. No acute infiltrate or  pleural effusion. No pulmonary edema. Stable scarring in lingula. Central mild bronchitic changes. Prior vertebroplasty lower thoracic spine again noted. Degenerative changes bilateral shoulders. IMPRESSION: No acute infiltrate or pulmonary edema. Central mild bronchitic changes. Electronically Signed   By: Natasha Mead M.D.   On: 04/25/2015 09:02    Jeoffrey Massed, MD  [facility] Pager:[phone]  If 7PM-7AM, please contact night-coverage [email or web address] Password [email or web address] 04/25/2015, 12:17 PM   LOS: 1 day

## 2015-04-25 NOTE — Progress Notes (Signed)
Initial Nutrition Assessment  DOCUMENTATION CODES:   Underweight  INTERVENTION:   Glucerna Shake po TID, each supplement provides 220 kcal and 10 grams of protein  NUTRITION DIAGNOSIS:   Predicted suboptimal nutrient intake related to lethargy/confusion as evidenced by  (chart review)  GOAL:   Patient will meet greater than or equal to 90% of their needs  MONITOR:   PO intake, Supplement acceptance, Labs, Weight trends, I & O's  REASON FOR ASSESSMENT:   Malnutrition Screening Tool  ASSESSMENT:   74 y.o. Female with PMH of DM with prior DKAs, CAD, HTN who presents with confusion and elevated CBGs.  Patient confused upon RD visit.  Shaking in bed.  Breakfast meal on tray table.  Noted opened Glucerna Shake supplement in room (no order in place).  Per Malnutrition Screening Tool, pt has been eating poorly and has had recent weight loss without trying.  RD to order Glucerna Shakes.  RD unable to complete Nutrition Focused Physical Exam at this time.  RD suspects malnutrition, however, unable to identify at this time.  Diet Order:  Diet Carb Modified Fluid consistency:: Thin; Room service appropriate?: Yes  Skin:  Reviewed, no issues  Last BM:  N/A  Height:   Ht Readings from Last 1 Encounters:  04/24/15 5\' 2"  (1.575 m)    Weight:   Wt Readings from Last 1 Encounters:  04/24/15 95 lb 14.4 oz (43.5 kg)    Ideal Body Weight:  50 kg  BMI:  Body mass index is 17.54 kg/(m^2).  Estimated Nutritional Needs:   Kcal:  1200-1400  Protein:  60-70 gm  Fluid:  >/= 1.5 L  EDUCATION NEEDS:   No education needs identified at this time  Beth ChattersKatie Malayzia Payne, RD, LDN Pager #: [phone] After-Hours Pager #: [phone]

## 2015-04-25 NOTE — Care Management Note (Signed)
Case Management Note  Patient Details  Name: Theron Aristahomasine C Provencher MRN: [id number] Date of Birth: 02/10/1941  Subjective/Objective:    Pt states she lives with "[name]" who is listed as a friend.  [facility] notified CM that pt is active with home health RN, PT, and OT.                       Expected Discharge Plan:  Home w Home Health Services  Discharge planning Services  CM Consult  Post Acute Care Choice:  Resumption of Svcs/PTA Provider  HH [facility]  [facility]  Status of Service:  In process, will continue to follow  Magdalene RiverMayo, Merrit Waugh T, RN 04/25/2015, 2:42 PM

## 2015-04-26 LAB — CBC
HCT: 33.1 % — ABNORMAL LOW (ref 36.0–46.0)
Hemoglobin: 10.5 g/dL — ABNORMAL LOW (ref 12.0–15.0)
MCH: 26.7 pg (ref 26.0–34.0)
MCHC: 31.7 g/dL (ref 30.0–36.0)
MCV: 84.2 fL (ref 78.0–100.0)
PLATELETS: 232 10*3/uL (ref 150–400)
RBC: 3.93 MIL/uL (ref 3.87–5.11)
RDW: 15.4 % (ref 11.5–15.5)
WBC: 10.7 10*3/uL — ABNORMAL HIGH (ref 4.0–10.5)

## 2015-04-26 LAB — GLUCOSE, CAPILLARY
GLUCOSE-CAPILLARY: 273 mg/dL — AB (ref 65–99)
GLUCOSE-CAPILLARY: 194 mg/dL — AB (ref 65–99)
Glucose-Capillary: 206 mg/dL — ABNORMAL HIGH (ref 65–99)
Glucose-Capillary: 200 mg/dL — ABNORMAL HIGH (ref 65–99)

## 2015-04-26 MED ORDER — INSULIN GLARGINE 100 UNIT/ML ~~LOC~~ SOLN
14.0000 [IU] | Freq: Every day | SUBCUTANEOUS | Status: DC
  Administered 2015-04-26 – 2015-04-27 (×2): 14 [IU] via SUBCUTANEOUS
  Filled 2015-04-26 (×3): qty 0.14

## 2015-04-26 MED ORDER — PANTOPRAZOLE SODIUM 40 MG PO TBEC
40.0000 mg | DELAYED_RELEASE_TABLET | Freq: Two times a day (BID) | ORAL | Status: DC
  Administered 2015-04-26 – 2015-04-28 (×5): 40 mg via ORAL
  Filled 2015-04-26 (×5): qty 1

## 2015-04-26 MED ORDER — ONDANSETRON HCL 4 MG/2ML IJ SOLN
4.0000 mg | Freq: Four times a day (QID) | INTRAMUSCULAR | Status: DC | PRN
  Administered 2015-04-27 (×2): 4 mg via INTRAVENOUS
  Filled 2015-04-26 (×2): qty 2

## 2015-04-26 MED ORDER — FERROUS SULFATE 325 (65 FE) MG PO TABS
325.0000 mg | ORAL_TABLET | Freq: Two times a day (BID) | ORAL | Status: DC
  Administered 2015-04-26 – 2015-04-28 (×5): 325 mg via ORAL
  Filled 2015-04-26 (×4): qty 1

## 2015-04-26 MED ORDER — AMLODIPINE BESYLATE 10 MG PO TABS
10.0000 mg | ORAL_TABLET | Freq: Every day | ORAL | Status: DC
  Administered 2015-04-26 – 2015-04-28 (×3): 10 mg via ORAL
  Filled 2015-04-26 (×3): qty 1

## 2015-04-26 NOTE — Progress Notes (Signed)
[facility] Progress Note  Subjective: Remains confused but eating regular diet. No stools in last 24 hours  Objective: Vital signs in last 24 hours: Temp:  [97.3 F (36.3 C)-98.5 F (36.9 C)] 98 F (36.7 C) (11/19 0804) Pulse Rate:  [95-108] 105 (11/19 0804) Resp:  [16-31] 23 (11/19 0804) BP: (132-192)/(57-86) 192/86 mmHg (11/19 0804) SpO2:  [98 %-100 %] 100 % (11/19 0804) Weight change:    PE: Unchanged  Lab Results: Results for orders placed or performed during the hospital encounter of 04/24/15 (from the past 24 hour(s))  Glucose, capillary     Status: Abnormal   Collection Time: 04/25/15 11:27 AM  Result Value Ref Range   Glucose-Capillary 254 (H) 65 - 99 mg/dL   Comment 1 Notify RN   Glucose, capillary     Status: Abnormal   Collection Time: 04/25/15  5:19 PM  Result Value Ref Range   Glucose-Capillary 247 (H) 65 - 99 mg/dL  Glucose, capillary     Status: Abnormal   Collection Time: 04/25/15  9:15 PM  Result Value Ref Range   Glucose-Capillary 177 (H) 65 - 99 mg/dL  CBC     Status: Abnormal   Collection Time: 04/26/15  4:02 AM  Result Value Ref Range   WBC 10.7 (H) 4.0 - 10.5 K/uL   RBC 3.93 3.87 - 5.11 MIL/uL   Hemoglobin 10.5 (L) 12.0 - 15.0 g/dL   HCT 16.133.1 (L) 09.636.0 - 04.546.0 %   MCV 84.2 78.0 - 100.0 fL   MCH 26.7 26.0 - 34.0 pg   MCHC 31.7 30.0 - 36.0 g/dL   RDW 40.915.4 81.111.5 - 91.415.5 %   Platelets 232 150 - 400 K/uL  Glucose, capillary     Status: Abnormal   Collection Time: 04/26/15  8:03 AM  Result Value Ref Range   Glucose-Capillary 273 (H) 65 - 99 mg/dL    Studies/Results: Dg Chest Port 1 View  04/25/2015  CLINICAL DATA:  Leukocytosis EXAM: PORTABLE CHEST 1 VIEW COMPARISON:  02/23/2015 FINDINGS: Cardiomediastinal silhouette is stable. No acute infiltrate or pleural effusion. No pulmonary edema. Stable scarring in lingula. Central mild bronchitic changes. Prior vertebroplasty lower thoracic spine again noted. Degenerative changes bilateral  shoulders. IMPRESSION: No acute infiltrate or pulmonary edema. Central mild bronchitic changes. Electronically Signed   By: Natasha MeadLiviu  Pop M.D.   On: 04/25/2015 09:02      Assessment: DKA with reported coffee-ground emesis, not confirmed as gastric occult positive, hemoglobin stable and no stools.  Plan: I do not think she is having active GI bleeding. Would treat her empirically with PPI for 6-8 weeks. We'll sign off for now.    Soriya Worster C 04/26/2015, 9:02 AM  Pager [phone] If no answer or after 5 PM call [phone]

## 2015-04-26 NOTE — Progress Notes (Signed)
Mr. Beth Payne was notified of patient's transfer to [facility].  Patient's appetite is very poor.  She stated that she is not hungry but she only drink some water.

## 2015-04-26 NOTE — Progress Notes (Signed)
CM spoke with CSW and with pt's permission, son of pt Beth Payne [phone] or [phone], and despite pt's understanding (dementia), Beth Payne is a very old friend and caretaker but does not live with pt and cannot provide 24/7 care.  Beth Payne expressed his desire for mother to to to SNF for rehab at this time.  CM text messaged MD to relay Steve's information.  CSW aware and will begin the process of looking for SNF availability and insurance approval.  No other CM needs were communicated.

## 2015-04-26 NOTE — Progress Notes (Signed)
PATIENT DETAILS Name: Beth Payne Age: 74 y.o. Sex: female Date of Birth: 04/02/1941 Admit Date: 04/24/2015 Admitting Physician Costin Otelia Sergeant, MD WUJ:[id number],YNWGNFA Hessie Diener, MD  Subjective: Remains pleasantly confused-But is following commands  Assessment/Plan: Active Problems: DKA (diabetic ketoacidoses): Resolved, off Insulin gtt-continue Lantus. Suspect malfunction of Insulin pump provoked DKA.   Type 1 DM: CBG's moderately controlled-increase Lantus to 14 units, continueSSI-follow and adjust accordingly  Transient Hematemesis: resolved-suspect could have had a small Mallory Weiss tear-GI following, continue PPI.Doubt need for EGD.   Leukocytosis: Resolved-Likely secondary to stress margination-chest x-ray negative for pneumonia, UA negative for UTI. Monitored off antibiotics  AKI: Resolved. Secondary to DKA.  Minimally elevated troponins: Not in a pattern off ACS-EKG negative. Recent transthoracic echo in July 2016 without any regional wall motion abnormalities. Suspect no further workup required.  Hypertension: Uncontrolled-increase amlodipine to 10 mg, continue Prazosin   Acute encephalopathy: Seems to have improved, suspect close to usual baseline-has dementia   Dementia: continue Aricept, Zolft and Abilify.  Pul OZH:YQMV per last Echo-stable for outpatient workup  Disposition: Remain inpatient-transfer to med-surg-needs case management/social work eval to determine safe disposition. Elderly demented patient-needs 24/7 care at home-otherwise will require SNF.  Antimicrobial agents  See below  Anti-infectives    None      DVT Prophylaxis: SCD's  Code Status: Full code   Family Communication Spoke to caregiver [name]-over the phone  Procedures: None  CONSULTS:  GI  Time spent 30 minutes-Greater than 50% of this time was spent in counseling, explanation of diagnosis, planning of further management, and coordination of  care.  MEDICATIONS: Scheduled Meds: . amLODipine  10 mg Oral Daily  . ARIPiprazole  2 mg Oral QHS  . ARIPiprazole  5 mg Oral Daily  . docusate sodium  100 mg Oral BID  . donepezil  10 mg Oral QHS  . feeding supplement (GLUCERNA SHAKE)  237 mL Oral TID BM  . insulin aspart  0-9 Units Subcutaneous TID WC  . insulin glargine  14 Units Subcutaneous QHS  . lamoTRIgine  50 mg Oral Daily  . multivitamin with minerals  1 tablet Oral Daily  . pantoprazole (PROTONIX) IV  40 mg Intravenous Q12H  . prazosin  1 mg Oral Daily  . rosuvastatin  5 mg Oral Daily  . sertraline  150 mg Oral Daily   Continuous Infusions:   PRN Meds:.LORazepam, polyethylene glycol    PHYSICAL EXAM: Vital signs in last 24 hours: Filed Vitals:   04/25/15 2100 04/25/15 2317 04/26/15 0200 04/26/15 0400  BP: 166/67 158/64 163/76 176/82  Pulse: 108 96 106   Temp:  98 F (36.7 C)  98.5 F (36.9 C)  TempSrc:  Oral  Oral  Resp: Height:      Weight:      SpO2: 100% 100% 99%     Weight change:  Filed Weights   04/24/15 1030 04/24/15 1521  Weight: 32.659 kg (72 lb) 43.5 kg (95 lb 14.4 oz)   Body mass index is 17.54 kg/(m^2).   Gen Exam: Awake -but pleasantly confused Neck: Supple, No JVD.   Chest: B/L Clear.   CVS: S1 S2 Regular, no murmurs.  Abdomen: soft, BS +, non tender, non distended.  Extremities: no edema, lower extremities warm to touch. Neurologic: Non Focal.   Skin: No Rash.   Wounds: N/A.   Intake/Output from previous day:  Intake/Output Summary (Last  24 hours) at 04/26/15 0755 Last data filed at 04/26/15 0400  Gross per 24 hour  Intake    409 ml  Output   1250 ml  Net   -841 ml     LAB RESULTS: CBC  Recent Labs Lab 04/24/15 1158 04/24/15 1838 04/25/15 0315 04/26/15 0402  WBC 17.3* 14.8* 14.8* 10.7*  HGB 11.3* 10.4* 9.6* 10.5*  HCT 34.9* 32.3* 29.4* 33.1*  PLT 279 267 233 232  MCV 85.7 84.6 84.5 84.2  MCH 27.8 27.2 27.6 26.7  MCHC 32.4 32.2 32.7 31.7  RDW  15.2 15.3 15.2 15.4    Chemistries   Recent Labs Lab 04/24/15 1158 04/24/15 1838 04/24/15 2308 04/25/15 0315  NA 138 144 143 140  K 3.4* 3.0* 4.1 3.7  CL 102 111 111 110  CO2 12* 20* 19* 18*  GLUCOSE 496* 262* 123* 204*  BUN 22* 16 17 16   CREATININE 1.49* 1.13* 1.00 1.05*  CALCIUM 10.3 9.8 10.1 9.8    CBG:  Recent Labs Lab 04/25/15 0217 04/25/15 0830 04/25/15 1127 04/25/15 1719 04/25/15 2115  GLUCAP 188* 239* 254* 247* 177*    GFR Estimated Creatinine Clearance: 32.8 mL/min (by C-G formula based on Cr of 1.05).  Coagulation profile No results for input(s): INR, PROTIME in the last 168 hours.  Cardiac Enzymes  Recent Labs Lab 04/24/15 1300 04/24/15 1838 04/25/15 0315  TROPONINI 0.04* 0.07* 0.07*    Invalid input(s): POCBNP No results for input(s): DDIMER in the last 72 hours. No results for input(s): HGBA1C in the last 72 hours. No results for input(s): CHOL, HDL, LDLCALC, TRIG, CHOLHDL, LDLDIRECT in the last 72 hours. No results for input(s): TSH, T4TOTAL, T3FREE, THYROIDAB in the last 72 hours.  Invalid input(s): FREET3 No results for input(s): VITAMINB12, FOLATE, FERRITIN, TIBC, IRON, RETICCTPCT in the last 72 hours. No results for input(s): LIPASE, AMYLASE in the last 72 hours.  Urine Studies No results for input(s): UHGB, CRYS in the last 72 hours.  Invalid input(s): UACOL, UAPR, USPG, UPH, UTP, UGL, UKET, UBIL, UNIT, UROB, ULEU, UEPI, UWBC, URBC, UBAC, CAST, UCOM, BILUA  MICROBIOLOGY: Recent Results (from the past 240 hour(s))  MRSA PCR Screening     Status: None   Collection Time: 04/24/15  6:35 PM  Result Value Ref Range Status   MRSA by PCR NEGATIVE NEGATIVE Final    Comment:        The GeneXpert MRSA Assay (FDA approved for NASAL specimens only), is one component of a comprehensive MRSA colonization surveillance program. It is not intended to diagnose MRSA infection nor to guide or monitor treatment for MRSA infections.      RADIOLOGY STUDIES/RESULTS: Dg Chest Port 1 View  04/25/2015  CLINICAL DATA:  Leukocytosis EXAM: PORTABLE CHEST 1 VIEW COMPARISON:  02/23/2015 FINDINGS: Cardiomediastinal silhouette is stable. No acute infiltrate or pleural effusion. No pulmonary edema. Stable scarring in lingula. Central mild bronchitic changes. Prior vertebroplasty lower thoracic spine again noted. Degenerative changes bilateral shoulders. IMPRESSION: No acute infiltrate or pulmonary edema. Central mild bronchitic changes. Electronically Signed   By: Natasha MeadLiviu  Pop M.D.   On: 04/25/2015 09:02    Jeoffrey MassedGHIMIRE,Shunta Mclaurin, MD  [facility] Pager:[phone]  If 7PM-7AM, please contact night-coverage [email or web address] Password [email or web address] 04/26/2015, 7:55 AM   LOS: 2 days

## 2015-04-27 LAB — GLUCOSE, CAPILLARY
GLUCOSE-CAPILLARY: 181 mg/dL — AB (ref 65–99)
GLUCOSE-CAPILLARY: 313 mg/dL — AB (ref 65–99)
GLUCOSE-CAPILLARY: 210 mg/dL — AB (ref 65–99)
GLUCOSE-CAPILLARY: 321 mg/dL — AB (ref 65–99)
Glucose-Capillary: 210 mg/dL — ABNORMAL HIGH (ref 65–99)

## 2015-04-27 MED ORDER — ALUM & MAG HYDROXIDE-SIMETH 200-200-20 MG/5ML PO SUSP
30.0000 mL | ORAL | Status: DC | PRN
  Administered 2015-04-28: 30 mL via ORAL
  Filled 2015-04-27: qty 30

## 2015-04-27 NOTE — Progress Notes (Signed)
Pt does not have 24-hour assist at home. Per PT eval, she requires min assist for all functional mobility with ambulation distance of 35 feet using RW. Due to assist level needed and no one at home to provide level of care, d/c plan updated to SNF.  Aida RaiderWendy Roselie Cirigliano, PT  Office # [phone] Pager [phone]

## 2015-04-27 NOTE — Clinical Social Work Placement (Signed)
   CLINICAL SOCIAL WORK PLACEMENT  NOTE  Date:  04/27/2015  Patient Details  Name: Theron Aristahomasine C Streb MRN: [id number] Date of Birth: 10/10/1940  Clinical Social Work is seeking post-discharge placement for this patient at the Skilled  Nursing Facility level of care (*CSW will initial, date and re-position this form in  chart as items are completed):  Yes   Patient/family provided with [facility] Clinical Social Work Department's list of facilities offering this level of care within the geographic area requested by the patient (or if unable, by the patient's family).  Yes   Patient/family informed of their freedom to choose among providers that offer the needed level of care, that participate in Medicare, Medicaid or managed care program needed by the patient, have an available bed and are willing to accept the patient.  Yes   Patient/family informed of [facility]'s ownership interest in [facility] and [facility], as well as of the fact that they are under no obligation to receive care at these facilities.  PASRR submitted to EDS on 04/27/15     PASRR number received on 04/27/15     Existing PASRR number confirmed on       FL2 transmitted to all facilities in geographic area requested by pt/family on 04/27/15     FL2 transmitted to all facilities within larger geographic area on       Patient informed that his/her managed care company has contracts with or will negotiate with certain facilities, including the following:            Patient/family informed of bed offers received.  Patient chooses bed at       Physician recommends and patient chooses bed at      Patient to be transferred to   on  .  Patient to be transferred to facility by       Patient family notified on   of transfer.  Name of family member notified:        PHYSICIAN Please sign FL2     Additional Comment:    _______________________________________________ Vaughan BrownerNixon, Hazley Dezeeuw A, LCSW 04/27/2015,  11:48 AM

## 2015-04-27 NOTE — Care Management Important Message (Signed)
Important Message  Patient Details  Name: Beth Payne MRN: [id number] Date of Birth: 02/01/1941   Medicare Important Message Given:  Yes    Hilario Robarts P Kimbely Whiteaker 04/27/2015, 10:13 AM

## 2015-04-27 NOTE — Clinical Social Work Note (Signed)
Clinical Social Work Assessment  Patient Details  Name: MADONNA FLEGAL MRN: [id number] Date of Birth: 1941/03/23  Date of referral:  04/27/15               Reason for consult:  Facility Placement, Discharge Planning                Permission sought to share information with:  Family Supports, Magazine features editor, Case Manager Permission granted to share information::  Yes, Verbal Permission Granted  Name::     Orlando Penner   Agency::  SNF  Relationship::  Son  Contact Information:  [phone]  Housing/Transportation Living arrangements for the past 2 months:  Single Family Home Source of Information:  Adult Children Patient Interpreter Needed:  None Criminal Activity/Legal Involvement Pertinent to Current Situation/Hospitalization:  No - Comment as needed Significant Relationships:  Adult Children, Significant Other Lives with:  Self Do you feel safe going back to the place where you live?  No Need for family participation in patient care:  Yes (Comment)  Care giving concerns:  Patient's son, Brett Canales and caregiver/significant other, [name] are unable to provide 24 hour supervision and assistance at home.    Social Worker assessment / plan:  Patient is demented, without 24 hour care at home. Clinical Social Worker spoke with pt's son, Brett Canales in reference to post- acute placement for SNF. CSW introduced CSW role and SNF process. Pt's son reported that patient is from home alone and that pt's significant other visits pt once per day. Per pt's son, pt's care giver and significant other is supportive and strongly involved in pt's care. Pt's son plans to contact pt's significant other to discuss potential discharge plans for SNF. CSW explained that currently PT is recommending that pt return home with Home Health service. CSW also explained that SNF stay will be short-term and asked that patient and caregiver start considering long-term care plans very soon. Pt's son explained  understanding. Pt's son requesting [facility]. CSW shared that [facility] is currently full. Pt's son would like for pt to be faxed out of all SNF's in [facility] as he and caregiver will make decision from bed offers.   Pt's son provided permission to contact patient's significant other/caregiver, Emmie Niemann [phone] as needed.   CSW also discussed case with MD. MD to notify PT to re-evaluate patient for appropriateness for SNF. CSW to complete FL-2 and fax to SNF's. CSW will continue to follow pt and pt's family for continued support and to facilitate pt's discharge needs once medically stable.   Employment status:  Retired Health and safety inspector:  Medicare PT Recommendations:  Home with Home Health Information / Referral to community resources:  Skilled Nursing Facility  Patient/Family's Response to care:  Pt demented. Pt's son agreeable to SNF placement and prefers [facility]. Pt's son understands that family must begin considering long term care plans for patient. Pt's son pleasant and appreciated social work intervention.   Patient/Family's Understanding of and Emotional Response to Diagnosis, Current Treatment, and Prognosis:  Pt's son understands that pt will likely need long term care at some point. Pt's son supportive and involved in care.  Emotional Assessment Appearance:  Developmentally appropriate Attitude/Demeanor/Rapport:  Unable to Assess Affect (typically observed):  Unable to Assess Orientation:  Oriented to Self, Oriented to  Time, Oriented to Place Alcohol / Substance use:  Not Applicable Psych involvement (Current and /or in the community):  No (Comment)  Discharge Needs  Concerns to  be addressed:  Care Coordination, Home Safety Concerns, Discharge Planning Concerns Readmission within the last 30 days:  No Current discharge risk:  Dependent with Mobility, Cognitively Impaired, Lives alone Barriers to Discharge:  Continued Medical Work up,  [employer] Authorization   Jendayi Berling, MSW, LCSWA [phone] 04/27/2015 11:38 AM

## 2015-04-27 NOTE — [place] FL2 (Signed)
[place] MEDICAID FL2 LEVEL OF CARE SCREENING TOOL     IDENTIFICATION  Patient Name: Beth Payne Birthdate: 10-13-1940 Sex: female Admission Date (Current Location): 04/24/2015  [facility] and [place] Number: Dispensing optician and Address:  [facility], [address], [place], [place] [address]      Provider Number: [id number]  Attending Physician Name and Address:  Maretta Bees, MD  Relative Name and Phone Number:       Current Level of Care: Hospital Recommended Level of Care: Skilled Nursing Facility Prior Approval Number:    Date Approved/Denied:   PASRR Number:   [id number]   Discharge Plan: SNF    Current Diagnoses: Patient Active Problem List   Diagnosis Date Noted  . Hematemesis 04/24/2015  . Hypokalemia 04/24/2015  . SIRS (systemic inflammatory response syndrome) (HCC) 02/23/2015  . AKI (acute kidney injury) (HCC) 02/23/2015  . Acute encephalopathy 01/01/2015  . Atelectasis of left lung 01/01/2015  . Hyperglycemia   . Fracture, ankle 12/23/2014  . DM (diabetes mellitus), type 1 with complications (HCC) 12/17/2014  . NSTEMI (non-ST elevated myocardial infarction) (HCC) 12/13/2014  . CAD in native artery   . Pulmonary hypertension (HCC)   . Symptomatic bradycardia   . DKA (diabetic ketoacidoses) (HCC) 12/12/2014  . GERD (gastroesophageal reflux disease) 12/12/2014  . CAD s/p stent to mid-RCA in 2001. 12/12/2014  . Osteoarthritis 12/12/2014  . Systolic murmur 12/12/2014  . Normocytic anemia 12/12/2014  . Dyslipidemia 12/12/2014  . Diabetic ketoacidosis without coma associated with type 1 diabetes mellitus (HCC)     Orientation ACTIVITIES/SOCIAL BLADDER RESPIRATION    Self, Time, Situation, Place  Family supportive Incontinent Normal  BEHAVIORAL SYMPTOMS/MOOD NEUROLOGICAL BOWEL NUTRITION STATUS   (NONE)  (NONE) Continent Diet (Carb Modified )  PHYSICIAN VISITS COMMUNICATION OF NEEDS Height & Weight Skin   Verbally  (157.5 cm) 95 lbs. Normal          AMBULATORY STATUS RESPIRATION    Supervision limited Normal      Personal Care Assistance Level of Assistance  Bathing, Dressing Bathing Assistance: Limited assistance   Dressing Assistance: Limited assistance      Functional Limitations Info                SPECIAL CARE FACTORS FREQUENCY  PT (By licensed PT)     PT Frequency: 3             Additional Factors Info  Code Status, Allergies Code Status Info: FULL CODE  Allergies Info: Diclofenac Sodium, Doxycycline Calcium, Codeine, Zocor           Current Medications (04/27/2015): Current Facility-Administered Medications  Medication Dose Route Frequency Provider Last Rate Last Dose  . alum & mag hydroxide-simeth (MAALOX/MYLANTA) 200-200-20 MG/5ML suspension 30 mL  30 mL Oral Q4H PRN Maretta Bees, MD      . amLODipine (NORVASC) tablet 10 mg  10 mg Oral Daily Maretta Bees, MD   10 mg at 04/27/15 1051  . ARIPiprazole (ABILIFY) tablet 2 mg  2 mg Oral QHS Gwenyth Bender, NP   2 mg at 04/26/15 2101  . ARIPiprazole (ABILIFY) tablet 5 mg  5 mg Oral Daily Lesle Chris Black, NP   5 mg at 04/27/15 1050  . docusate sodium (COLACE) capsule 100 mg  100 mg Oral BID Lesle Chris Black, NP   100 mg at 04/27/15 1050  . donepezil (ARICEPT) tablet 10 mg  10 mg Oral QHS Clydie Braun  Lawrence MarseillesM Black, NP   10 mg at 04/26/15 2101  . feeding supplement (GLUCERNA SHAKE) (GLUCERNA SHAKE) liquid 237 mL  237 mL Oral TID BM Ailene Ardsatherine C Lamberton, RD   237 mL at 04/26/15 2030  . ferrous sulfate tablet 325 mg  325 mg Oral BID Maretta BeesShanker M Ghimire, MD   325 mg at 04/27/15 1051  . insulin aspart (novoLOG) injection 0-9 Units  0-9 Units Subcutaneous TID WC Leda GauzeKaren J Kirby-Graham, NP   2 Units at 04/27/15 [phone]  . insulin glargine (LANTUS) injection 14 Units  14 Units Subcutaneous QHS Maretta BeesShanker M Ghimire, MD   14 Units at 04/26/15 2229  . lamoTRIgine (LAMICTAL) tablet 50 mg  50 mg Oral Daily Gwenyth BenderKaren M Black, NP   50 mg at  04/27/15 1052  . LORazepam (ATIVAN) tablet 0.5 mg  0.5 mg Oral Q8H PRN Gwenyth BenderKaren M Black, NP   0.5 mg at 04/26/15 1510  . multivitamin with minerals tablet 1 tablet  1 tablet Oral Daily Gwenyth BenderKaren M Black, NP   1 tablet at 04/27/15 1053  . ondansetron (ZOFRAN) injection 4 mg  4 mg Intravenous Q6H PRN Maretta BeesShanker M Ghimire, MD   4 mg at 04/27/15 0025  . pantoprazole (PROTONIX) EC tablet 40 mg  40 mg Oral BID Faye RamsayRachel L Rumbarger, RPH   40 mg at 04/27/15 1049  . polyethylene glycol (MIRALAX / GLYCOLAX) packet 17 g  17 g Oral Daily PRN Gwenyth BenderKaren M Black, NP      . prazosin (MINIPRESS) capsule 1 mg  1 mg Oral Daily Lesle ChrisKaren M Black, NP   1 mg at 04/27/15 1050  . rosuvastatin (CRESTOR) tablet 5 mg  5 mg Oral Daily Lesle ChrisKaren M Black, NP   5 mg at 04/27/15 1053  . sertraline (ZOLOFT) tablet 150 mg  150 mg Oral Daily Gwenyth BenderKaren M Black, NP   150 mg at 04/27/15 1051   Do not use this list as official medication orders. Please verify with discharge summary.  Discharge Medications:   Medication List    ASK your doctor about these medications        acetaminophen 325 MG tablet  Commonly known as:  TYLENOL  Take 2 tablets (650 mg total) by mouth every 6 (six) hours as needed for mild pain (or Fever >/= 101).     amLODipine 5 MG tablet  Commonly known as:  NORVASC  Take 1 tablet (5 mg total) by mouth daily.     ARIPiprazole 5 MG tablet  Commonly known as:  ABILIFY  Take 5 mg by mouth daily.     ARIPiprazole 2 MG tablet  Commonly known as:  ABILIFY  Take 2 mg by mouth at bedtime.     aspirin EC 325 MG tablet  Take 325 mg by mouth daily.     atorvastatin 20 MG tablet  Commonly known as:  LIPITOR  Take 20 mg by mouth daily.     docusate sodium 100 MG capsule  Commonly known as:  COLACE  Take 100 mg by mouth 2 (two) times daily.     donepezil 10 MG tablet  Commonly known as:  ARICEPT  Take 10 mg by mouth at bedtime.     ferrous sulfate 325 (65 FE) MG tablet  Take 325 mg by mouth 2 (two) times daily.     insulin  aspart 100 UNIT/ML injection  Commonly known as:  NOVOLOG  Before each meal 3 times a day, 140-199 - 2 units, 200-250 - 4 units, 251-299 -  6 units,  300-349 - 8 units,  350 or above 10 units. Dispense syringes and needles as needed, Ok to switch to PEN if approved. Substitute to any brand approved. DX DM2, Code E11.65.   You can continue to use previous supply of Novolog/Humolog at this dose     insulin glargine 100 UNIT/ML injection  Commonly known as:  LANTUS  Inject 0.12 mLs (12 Units total) into the skin at bedtime.     LAMICTAL 100 MG tablet  Generic drug:  lamoTRIgine  Take 50 mg by mouth daily.     LORazepam 0.5 MG tablet  Commonly known as:  ATIVAN  Take 0.5 mg by mouth 3 (three) times daily as needed. Anxiety     multivitamin with minerals Tabs tablet  Take 1 tablet by mouth daily.     omeprazole 20 MG capsule  Commonly known as:  PRILOSEC  Take 40 mg by mouth daily.     polyethylene glycol packet  Commonly known as:  MIRALAX / GLYCOLAX  Take 17 g by mouth daily as needed for mild constipation.     prazosin 1 MG capsule  Commonly known as:  MINIPRESS  Take 1 mg by mouth daily.     PROCEL Powd  Take 1 scoop by mouth 2 (two) times daily.     rosuvastatin 5 MG tablet  Commonly known as:  CRESTOR  Take 5 mg by mouth daily.     sertraline 100 MG tablet  Commonly known as:  ZOLOFT  Take 150 mg by mouth daily.     Vitamin D3 2000 UNITS Tabs  Take by mouth daily. For supplement        Relevant Imaging Results:  Relevant Lab Results:  Recent Labs    Additional Information SSN [id number]  Derenda Fennel, MSW, LCSWA [phone] 04/27/2015 12:00 PM

## 2015-04-27 NOTE — Progress Notes (Signed)
PATIENT DETAILS Name: Beth Payne Age: 74 y.o. Sex: female Date of Birth: 01/01/1941 Admit Date: 04/24/2015 Admitting Physician Costin Otelia SergeantM Gherghe, MD WUJ:WJXBJ,YNWGNFAPCP:SOUTH,STEPHEN Hessie DienerALAN, MD  Subjective: Remains pleasantly/Mildy confused-claims she has a poor appetite this am  Assessment/Plan: Active Problems: DKA (diabetic ketoacidoses): Resolved, off Insulin gtt-continue Lantus. Suspect malfunction of Insulin pump provoked DKA. Given dementia, will need close outpatient monitoring for insulin administration or will need SNF placement.  Type 1 DM: CBG's moderately controlled-increase Lantus to 18 units, continueSSI-follow and adjust accordingly  Transient Hematemesis: resolved-suspect could have had a small Mallory Weiss tear-GI consulted-recommendations are to continue PPI for 6-8 weeks.  Leukocytosis: Resolved-Likely secondary to stress margination-chest x-ray negative for pneumonia, UA negative for UTI. Monitored off antibiotics  AKI: Resolved. Secondary to DKA.  Minimally elevated troponins: Not in a pattern off ACS-EKG negative. Recent transthoracic echo in July 2016 without any regional wall motion abnormalities. Suspect no further workup required.  Hypertension: moderately controlled-continue amlodipine and prazosin.  Acute encephalopathy: Seems to have improved, suspect close to usual baseline-has dementia   Dementia: continue Aricept, Zolft and Abilify.  Pul OZH:YQMVHTN:mild per last Echo-stable for outpatient workup  Disposition: Remain inpatient-SNF 11/21  Antimicrobial agents  See below  Anti-infectives    None      DVT Prophylaxis: SCD's  Code Status: Full code   Family Communication Spoke to caregiver [name]-over the phoneon 11/19  Procedures: None  CONSULTS:  GI  Time spent 25 minutes-Greater than 50% of this time was spent in counseling, explanation of diagnosis, planning of further management, and coordination of  care.  MEDICATIONS: Scheduled Meds: . amLODipine  10 mg Oral Daily  . ARIPiprazole  2 mg Oral QHS  . ARIPiprazole  5 mg Oral Daily  . docusate sodium  100 mg Oral BID  . donepezil  10 mg Oral QHS  . feeding supplement (GLUCERNA SHAKE)  237 mL Oral TID BM  . ferrous sulfate  325 mg Oral BID  . insulin aspart  0-9 Units Subcutaneous TID WC  . insulin glargine  14 Units Subcutaneous QHS  . lamoTRIgine  50 mg Oral Daily  . multivitamin with minerals  1 tablet Oral Daily  . pantoprazole  40 mg Oral BID  . prazosin  1 mg Oral Daily  . rosuvastatin  5 mg Oral Daily  . sertraline  150 mg Oral Daily   Continuous Infusions:   PRN Meds:.alum & mag hydroxide-simeth, LORazepam, ondansetron (ZOFRAN) IV, polyethylene glycol    PHYSICAL EXAM: Vital signs in last 24 hours: Filed Vitals:   04/26/15 1219 04/26/15 1430 04/26/15 2114 04/27/15 0615  BP: 173/78 144/58 146/66 151/72  Pulse: 92 90 91 88  Temp: 98.1 F (36.7 C) 97.7 F (36.5 C) 97.3 F (36.3 C) 97.4 F (36.3 C)  TempSrc: Oral Oral Oral Oral  Resp: 22 20 17 19   Height:      Weight:      SpO2: 100% 99% 100% 99%    Weight change:  Filed Weights   04/24/15 1030 04/24/15 1521  Weight: 32.659 kg (72 lb) 43.5 kg (95 lb 14.4 oz)   Body mass index is 17.54 kg/(m^2).   Gen Exam: Awake -but pleasantly confused Neck: Supple, No JVD.   Chest: B/L Clear.   CVS: S1 S2 Regular, no murmurs.  Abdomen: soft, BS +, non tender, non distended.  Extremities: no edema, lower extremities warm to touch. Neurologic: Non Focal.  Skin: No Rash.   Wounds: N/A.   Intake/Output from previous day:  Intake/Output Summary (Last 24 hours) at 04/27/15 1150 Last data filed at 04/27/15 0203  Gross per 24 hour  Intake    340 ml  Output    150 ml  Net    190 ml     LAB RESULTS: CBC  Recent Labs Lab 04/24/15 1158 04/24/15 1838 04/25/15 0315 04/26/15 0402  WBC 17.3* 14.8* 14.8* 10.7*  HGB 11.3* 10.4* 9.6* 10.5*  HCT 34.9* 32.3* 29.4*  33.1*  PLT 279 267 233 232  MCV 85.7 84.6 84.5 84.2  MCH 27.8 27.2 27.6 26.7  MCHC 32.4 32.2 32.7 31.7  RDW 15.2 15.3 15.2 15.4    Chemistries   Recent Labs Lab 04/24/15 1158 04/24/15 1838 04/24/15 2308 04/25/15 0315  NA 138 144 143 140  K 3.4* 3.0* 4.1 3.7  CL 102 111 111 110  CO2 12* 20* 19* 18*  GLUCOSE 496* 262* 123* 204*  BUN 22* CREATININE 1.49* 1.13* 1.00 1.05*  CALCIUM 10.3 9.8 10.1 9.8    CBG:  Recent Labs Lab 04/26/15 0803 04/26/15 1219 04/26/15 1706 04/26/15 2113 04/27/15 0748  GLUCAP 273* 194* 206* 200* 181*    GFR Estimated Creatinine Clearance: 32.8 mL/min (by C-G formula based on Cr of 1.05).  Coagulation profile No results for input(s): INR, PROTIME in the last 168 hours.  Cardiac Enzymes  Recent Labs Lab 04/24/15 1300 04/24/15 1838 04/25/15 0315  TROPONINI 0.04* 0.07* 0.07*    Invalid input(s): POCBNP No results for input(s): DDIMER in the last 72 hours. No results for input(s): HGBA1C in the last 72 hours. No results for input(s): CHOL, HDL, LDLCALC, TRIG, CHOLHDL, LDLDIRECT in the last 72 hours. No results for input(s): TSH, T4TOTAL, T3FREE, THYROIDAB in the last 72 hours.  Invalid input(s): FREET3 No results for input(s): VITAMINB12, FOLATE, FERRITIN, TIBC, IRON, RETICCTPCT in the last 72 hours. No results for input(s): LIPASE, AMYLASE in the last 72 hours.  Urine Studies No results for input(s): UHGB, CRYS in the last 72 hours.  Invalid input(s): UACOL, UAPR, USPG, UPH, UTP, UGL, UKET, UBIL, UNIT, UROB, ULEU, UEPI, UWBC, URBC, UBAC, CAST, UCOM, BILUA  MICROBIOLOGY: Recent Results (from the past 240 hour(s))  MRSA PCR Screening     Status: None   Collection Time: 04/24/15  6:35 PM  Result Value Ref Range Status   MRSA by PCR NEGATIVE NEGATIVE Final    Comment:        The GeneXpert MRSA Assay (FDA approved for NASAL specimens only), is one component of a comprehensive MRSA colonization surveillance program.  It is not intended to diagnose MRSA infection nor to guide or monitor treatment for MRSA infections.     RADIOLOGY STUDIES/RESULTS: Dg Chest Port 1 View  04/25/2015  CLINICAL DATA:  Leukocytosis EXAM: PORTABLE CHEST 1 VIEW COMPARISON:  02/23/2015 FINDINGS: Cardiomediastinal silhouette is stable. No acute infiltrate or pleural effusion. No pulmonary edema. Stable scarring in lingula. Central mild bronchitic changes. Prior vertebroplasty lower thoracic spine again noted. Degenerative changes bilateral shoulders. IMPRESSION: No acute infiltrate or pulmonary edema. Central mild bronchitic changes. Electronically Signed   By: Natasha Mead M.D.   On: 04/25/2015 09:02    Jeoffrey Massed, MD  [facility] Pager:[phone]  If 7PM-7AM, please contact night-coverage [email or web address] Password [email or web address] 04/27/2015, 11:50 AM   LOS: 3 days

## 2015-04-28 ENCOUNTER — Inpatient Hospital Stay (HOSPITAL_COMMUNITY): Payer: Medicare Other

## 2015-04-28 DIAGNOSIS — K5900 Constipation, unspecified: Secondary | ICD-10-CM | POA: Diagnosis not present

## 2015-04-28 DIAGNOSIS — D509 Iron deficiency anemia, unspecified: Secondary | ICD-10-CM | POA: Diagnosis not present

## 2015-04-28 DIAGNOSIS — Z794 Long term (current) use of insulin: Secondary | ICD-10-CM | POA: Diagnosis not present

## 2015-04-28 DIAGNOSIS — D539 Nutritional anemia, unspecified: Secondary | ICD-10-CM | POA: Diagnosis not present

## 2015-04-28 DIAGNOSIS — F329 Major depressive disorder, single episode, unspecified: Secondary | ICD-10-CM | POA: Diagnosis not present

## 2015-04-28 DIAGNOSIS — N179 Acute kidney failure, unspecified: Secondary | ICD-10-CM | POA: Diagnosis not present

## 2015-04-28 DIAGNOSIS — E1069 Type 1 diabetes mellitus with other specified complication: Secondary | ICD-10-CM | POA: Diagnosis not present

## 2015-04-28 DIAGNOSIS — E108 Type 1 diabetes mellitus with unspecified complications: Secondary | ICD-10-CM | POA: Diagnosis not present

## 2015-04-28 DIAGNOSIS — I129 Hypertensive chronic kidney disease with stage 1 through stage 4 chronic kidney disease, or unspecified chronic kidney disease: Secondary | ICD-10-CM | POA: Diagnosis not present

## 2015-04-28 DIAGNOSIS — K920 Hematemesis: Secondary | ICD-10-CM | POA: Diagnosis not present

## 2015-04-28 DIAGNOSIS — E1010 Type 1 diabetes mellitus with ketoacidosis without coma: Secondary | ICD-10-CM | POA: Diagnosis not present

## 2015-04-28 DIAGNOSIS — R1310 Dysphagia, unspecified: Secondary | ICD-10-CM | POA: Diagnosis not present

## 2015-04-28 DIAGNOSIS — E1310 Other specified diabetes mellitus with ketoacidosis without coma: Secondary | ICD-10-CM | POA: Diagnosis not present

## 2015-04-28 DIAGNOSIS — F0390 Unspecified dementia without behavioral disturbance: Secondary | ICD-10-CM | POA: Diagnosis not present

## 2015-04-28 DIAGNOSIS — R2681 Unsteadiness on feet: Secondary | ICD-10-CM | POA: Diagnosis not present

## 2015-04-28 DIAGNOSIS — G4700 Insomnia, unspecified: Secondary | ICD-10-CM | POA: Diagnosis not present

## 2015-04-28 DIAGNOSIS — R1314 Dysphagia, pharyngoesophageal phase: Secondary | ICD-10-CM | POA: Diagnosis not present

## 2015-04-28 DIAGNOSIS — G9340 Encephalopathy, unspecified: Secondary | ICD-10-CM | POA: Diagnosis not present

## 2015-04-28 DIAGNOSIS — I2510 Atherosclerotic heart disease of native coronary artery without angina pectoris: Secondary | ICD-10-CM | POA: Diagnosis not present

## 2015-04-28 DIAGNOSIS — F419 Anxiety disorder, unspecified: Secondary | ICD-10-CM | POA: Diagnosis not present

## 2015-04-28 DIAGNOSIS — F339 Major depressive disorder, recurrent, unspecified: Secondary | ICD-10-CM | POA: Diagnosis not present

## 2015-04-28 DIAGNOSIS — E0810 Diabetes mellitus due to underlying condition with ketoacidosis without coma: Secondary | ICD-10-CM | POA: Diagnosis not present

## 2015-04-28 DIAGNOSIS — D72829 Elevated white blood cell count, unspecified: Secondary | ICD-10-CM | POA: Diagnosis not present

## 2015-04-28 DIAGNOSIS — I10 Essential (primary) hypertension: Secondary | ICD-10-CM | POA: Diagnosis not present

## 2015-04-28 DIAGNOSIS — K226 Gastro-esophageal laceration-hemorrhage syndrome: Secondary | ICD-10-CM | POA: Diagnosis not present

## 2015-04-28 DIAGNOSIS — Z5189 Encounter for other specified aftercare: Secondary | ICD-10-CM | POA: Diagnosis not present

## 2015-04-28 DIAGNOSIS — E785 Hyperlipidemia, unspecified: Secondary | ICD-10-CM | POA: Diagnosis not present

## 2015-04-28 DIAGNOSIS — K219 Gastro-esophageal reflux disease without esophagitis: Secondary | ICD-10-CM | POA: Diagnosis not present

## 2015-04-28 DIAGNOSIS — R5381 Other malaise: Secondary | ICD-10-CM | POA: Diagnosis not present

## 2015-04-28 DIAGNOSIS — M6281 Muscle weakness (generalized): Secondary | ICD-10-CM | POA: Diagnosis not present

## 2015-04-28 LAB — GLUCOSE, CAPILLARY
GLUCOSE-CAPILLARY: 246 mg/dL — AB (ref 65–99)
Glucose-Capillary: 117 mg/dL — ABNORMAL HIGH (ref 65–99)
Glucose-Capillary: 204 mg/dL — ABNORMAL HIGH (ref 65–99)

## 2015-04-28 MED ORDER — INSULIN GLARGINE 100 UNIT/ML ~~LOC~~ SOLN
18.0000 [IU] | Freq: Every day | SUBCUTANEOUS | Status: DC
  Filled 2015-04-28: qty 0.18

## 2015-04-28 MED ORDER — PANTOPRAZOLE SODIUM 40 MG PO TBEC: 40.0000 mg | DELAYED_RELEASE_TABLET | Freq: Two times a day (BID) | ORAL | Status: DC

## 2015-04-28 MED ORDER — ASPIRIN EC 81 MG PO TBEC: 81.0000 mg | DELAYED_RELEASE_TABLET | Freq: Every day | ORAL | Status: DC

## 2015-04-28 MED ORDER — INSULIN GLARGINE 100 UNIT/ML ~~LOC~~ SOLN: 18.0000 [IU] | Freq: Every day | SUBCUTANEOUS | Status: DC

## 2015-04-28 MED ORDER — ARIPIPRAZOLE 5 MG PO TABS: 5.0000 mg | ORAL_TABLET | Freq: Every morning | ORAL | Status: AC

## 2015-04-28 MED ORDER — GLUCERNA SHAKE PO LIQD: 237.0000 mL | Freq: Three times a day (TID) | ORAL | Status: DC

## 2015-04-28 MED ORDER — LORAZEPAM 0.5 MG PO TABS: 0.5000 mg | ORAL_TABLET | Freq: Three times a day (TID) | ORAL | Status: DC | PRN

## 2015-04-28 MED ORDER — IOHEXOL 300 MG/ML  SOLN
100.0000 mL | Freq: Once | INTRAMUSCULAR | Status: DC | PRN
  Administered 2015-04-28: 100 mL via ORAL
  Filled 2015-04-28: qty 100

## 2015-04-28 MED ORDER — INSULIN ASPART 100 UNIT/ML ~~LOC~~ SOLN: SUBCUTANEOUS | Status: DC

## 2015-04-28 NOTE — Discharge Planning (Signed)
[facility]. Patient's son, Henriette CombsSteven Freeman, updated regarding discharge.  [facility] report number: [id number] Transportation: EMS   Marcelline Deistmily Genie Mirabal, [place] [phone] Orthopedics: [id number] Surgical: [phone]

## 2015-04-28 NOTE — Discharge Summary (Signed)
PATIENT DETAILS Name: Beth Payne Age: 74 y.o. Sex: female Date of Birth: 22-Sep-1940 MRN: [id number]. Admitting Physician: Leatha Gilding, MD WUJ:[id number] Hessie Diener, MD  Admit Date: 04/24/2015 Discharge date: 04/28/2015  Recommendations for Outpatient Follow-up:  1. 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 Optimize Glycemic Control 3. Consider Speech therapy follow up at SNF if develops worsening dysphagia  PRIMARY DISCHARGE DIAGNOSIS:  Active Problems:   DKA (diabetic ketoacidoses) (HCC)   GERD (gastroesophageal reflux disease)   Systolic murmur   Normocytic anemia   Pulmonary hypertension (HCC)   Acute encephalopathy   AKI (acute kidney injury) (HCC)   Hematemesis   Hypokalemia      PAST MEDICAL HISTORY: Past Medical History  Diagnosis Date  . Coronary artery disease   . Depression   . Retinopathy   . Anxiety   . Heart murmur   . TIA (transient ischemic attack)   . Arthritis     "fingers" (12/23/2014)  . Fibromyalgia   . Type I diabetes mellitus (HCC) dx'd 1953    "I'm on a pup" (12/23/2014)  . Chronic lower back pain   . GERD (gastroesophageal reflux disease) dx'd 1995  . Osteopenia dx'd 2007    DISCHARGE MEDICATIONS: Current Discharge Medication List    START taking these medications   Details  feeding supplement, GLUCERNA SHAKE, (GLUCERNA SHAKE) LIQD Take 237 mLs by mouth 3 (three) times daily between meals. Refills: 0    pantoprazole (PROTONIX) 40 MG tablet Take 1 tablet (40 mg total) by mouth 2 (two) times daily.      CONTINUE these medications which have CHANGED   Details  !! ARIPiprazole (ABILIFY) 5 MG tablet Take 1 tablet (5 mg total) by mouth every morning.    aspirin EC 81 MG tablet Take 1 tablet (81 mg total) by mouth daily.    insulin aspart (NOVOLOG) 100 UNIT/ML injection 0-9 Units, Subcutaneous, 3 times daily with meals CBG <  70: implement hypoglycemia protocol CBG 70 - 120: 0 units CBG 121 - 150: 1 unit CBG 151 - 200: 2 units CBG 201 - 250: 3 units CBG 251 - 300: 5 units CBG 301 - 350: 7 units CBG 351 - 400: 9 units CBG > 400: call MD Qty: 10 mL, Refills: 11    insulin glargine (LANTUS) 100 UNIT/ML injection Inject 0.18 mLs (18 Units total) into the skin at bedtime.    LORazepam (ATIVAN) 0.5 MG tablet Take 1 tablet (0.5 mg total) by mouth 3 (three) times daily as needed. Anxiety Qty: 30 tablet, Refills: 0     !! - Potential duplicate medications found. Please discuss with provider.    CONTINUE these medications which have NOT CHANGED   Details  amLODipine (NORVASC) 5 MG tablet Take 1 tablet (5 mg total) by mouth daily. Qty: 30 tablet, Refills: 0    Cholecalciferol (VITAMIN D3) 2000 UNITS TABS Take by mouth daily. For supplement    docusate sodium (COLACE) 100 MG capsule Take 100 mg by mouth 2 (two) times daily.    donepezil (ARICEPT) 10 MG tablet Take 10 mg by mouth at bedtime.    ferrous sulfate 325 (65 FE) MG tablet Take 325 mg by mouth 2 (two) times daily.    lamoTRIgine (LAMICTAL) 100 MG tablet Take 50 mg by mouth daily.    Multiple Vitamin (MULITIVITAMIN WITH MINERALS) TABS Take 1 tablet by mouth daily.    omeprazole (PRILOSEC) 20 MG  capsule Take 40 mg by mouth daily.    prazosin (MINIPRESS) 1 MG capsule Take 1 mg by mouth daily.    rosuvastatin (CRESTOR) 5 MG tablet Take 5 mg by mouth daily.    sertraline (ZOLOFT) 100 MG tablet Take 150 mg by mouth daily.    acetaminophen (TYLENOL) 325 MG tablet Take 2 tablets (650 mg total) by mouth every 6 (six) hours as needed for mild pain (or Fever >/= 101).    !! ARIPiprazole (ABILIFY) 2 MG tablet Take 2 mg by mouth at bedtime.     polyethylene glycol (MIRALAX / GLYCOLAX) packet Take 17 g by mouth daily as needed for mild constipation. Qty: 14 each, Refills: 0    Protein (PROCEL) POWD Take 1 scoop by mouth 2 (two) times daily.     !! -  Potential duplicate medications found. Please discuss with provider.    STOP taking these medications     atorvastatin (LIPITOR) 20 MG tablet         ALLERGIES:   Allergies  Allergen Reactions  . Diclofenac Sodium Swelling and Other (See Comments)    Blurred vision  . Doxycycline Calcium Swelling and Other (See Comments)    Blurred vision  . Codeine Nausea And Vomiting and Other (See Comments)    Dizziness   . Zocor [Simvastatin] Other (See Comments)    Dizziness, weakness     BRIEF HPI:  See H&P, Labs, Consult and Test reports for all details in brief, patient a 74 y.o. female with a Past Medical History of DM with prior DKAs, CAD, HTN who presents with confusion and DKA.   CONSULTATIONS:   GI  PERTINENT RADIOLOGIC STUDIES: Dg Esophagus  04/28/2015  CLINICAL DATA:  Painful and difficulty swallowing. EXAM: ESOPHOGRAM/BARIUM SWALLOW TECHNIQUE: Single contrast examination was performed using water soluble contrast. FLUOROSCOPY TIME:  Fluoroscopy Time:  1 minute 38 seconds COMPARISON:  None. FINDINGS: The patient ingested water-soluble contrast. There is no evidence of any leak from the esophagus. There were poor primary and secondary stripping waves. There is no obstruction or mass lesion. No visible esophagitis. The patient ingested a 13 mm barium tablet. It passed into the distal esophagus but did not pass into the stomach despite repeated swallows of water. I think this is more likely due to poor esophageal peristalsis and a true obstruction. The patient may have early achalasia. IMPRESSION: No evidence of esophageal tear. 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 13 mm barium tablet never passed into the stomach. Electronically Signed   By: Francene Boyers M.D.   On: 04/28/2015 10:20   Dg Chest Port 1 View  04/25/2015  CLINICAL DATA:  Leukocytosis EXAM: PORTABLE CHEST 1 VIEW COMPARISON:  02/23/2015 FINDINGS: Cardiomediastinal silhouette is stable. No acute  infiltrate or pleural effusion. No pulmonary edema. Stable scarring in lingula. Central mild bronchitic changes. Prior vertebroplasty lower thoracic spine again noted. Degenerative changes bilateral shoulders. IMPRESSION: No acute infiltrate or pulmonary edema. Central mild bronchitic changes. Electronically Signed   By: Natasha Mead M.D.   On: 04/25/2015 09:02     PERTINENT LAB RESULTS: CBC:  Recent Labs  04/26/15 0402  WBC 10.7*  HGB 10.5*  HCT 33.1*  PLT 232   CMET CMP     Component Value Date/Time   NA 140 04/25/2015 0315   NA 140 02/28/2015   K 3.7 04/25/2015 0315   CL 110 04/25/2015 0315   CO2 18* 04/25/2015 0315   GLUCOSE 204* 04/25/2015 0315   BUN 16  04/25/2015 0315   BUN 12 02/28/2015   CREATININE 1.05* 04/25/2015 0315   CREATININE 0.7 02/28/2015   CALCIUM 9.8 04/25/2015 0315   PROT 5.7* 02/26/2015 0242   ALBUMIN 3.3* 02/26/2015 0242   AST 38 02/26/2015 0242   ALT 30 02/26/2015 0242   ALKPHOS 72 02/26/2015 0242   BILITOT 0.6 02/26/2015 0242   GFRNONAA 51* 04/25/2015 0315   GFRAA 60* 04/25/2015 0315    GFR Estimated Creatinine Clearance: 32.8 mL/min (by C-G formula based on Cr of 1.05). No results for input(s): LIPASE, AMYLASE in the last 72 hours. No results for input(s): CKTOTAL, CKMB, CKMBINDEX, TROPONINI in the last 72 hours. Invalid input(s): POCBNP No results for input(s): DDIMER in the last 72 hours. No results for input(s): HGBA1C in the last 72 hours. No results for input(s): CHOL, HDL, LDLCALC, TRIG, CHOLHDL, LDLDIRECT in the last 72 hours. No results for input(s): TSH, T4TOTAL, T3FREE, THYROIDAB in the last 72 hours.  Invalid input(s): FREET3 No results for input(s): VITAMINB12, FOLATE, FERRITIN, TIBC, IRON, RETICCTPCT in the last 72 hours. Coags: No results for input(s): INR in the last 72 hours.  Invalid input(s): PT Microbiology: Recent Results (from the past 240 hour(s))  MRSA PCR Screening     Status: None   Collection Time: 04/24/15   6:35 PM  Result Value Ref Range Status   MRSA by PCR NEGATIVE NEGATIVE Final    Comment:        The GeneXpert MRSA Assay (FDA approved for NASAL specimens only), is one component of a comprehensive MRSA colonization surveillance program. It is not intended to diagnose MRSA infection nor to guide or monitor treatment for MRSA infections.      BRIEF HOSPITAL COURSE:  DKA (diabetic ketoacidoses): Resolved, off Insulin gtt-continue Lantus. Suspect malfunction of Insulin pump provoked DKA. Given dementia, and recurrent admissions for DKA-not a candidate for insulin pump-have transitioned to SQ insulin.  Type 1 DM: CBG's moderately controlled-increased Lantus to 18 units, continueSSI-follow and adjust accordingly  Transient Hematemesis: resolved-suspect could have had a small Mallory Weiss tear-GI consulted-recommendations are to continue PPI for 6-8 weeks.  Leukocytosis: Resolved-Likely secondary to stress margination-chest x-ray negative for pneumonia, UA negative for UTI. Monitored off antibiotics  AKI: Resolved. Secondary to DKA.  Minimally elevated troponins: Not in a pattern off ACS-EKG negative. Recent transthoracic echo in July 2016 without any regional wall motion abnormalities. Suspect no further workup required.  Hypertension: moderately controlled-continue amlodipine and prazosin.  Acute encephalopathy: Seems to have improved, suspect close to usual baseline-has dementia   ?Dysphagia/Odynoophagia: noted to complain of intermittent dysphagia-but with history of dementia-not sure exactly how reliable this is-underwent Barium Esophagogram-suspicion for motility disorder-she tolerated dinner last evening well.  Spoke with GI MD Dr Marzetta Board suggested supportive care-with soft diet and to follow with GI in the outpatient setting.Follow closely in the outpatient setting. Spoke with son-Steve over the phone-relayed some aspiration risks-he is aware.  Dementia: continue Aricept,  Zolft and Abilify.  Pul ZOX:WRUE per last Echo-stable for outpatient workup  TODAY-DAY OF DISCHARGE:  Subjective:   Teneshia Bhagat today has no headache,no chest abdominal pain,no new weakness tingling or numbness, feels much better.She is mildly confused   Objective:   Blood pressure 134/62, pulse 68, temperature 98.9 F (37.2 C), temperature source Oral, resp. rate 18, height  (1.575 m), weight 43.5 kg (95 lb 14.4 oz), SpO2 97 %.  Intake/Output Summary (Last 24 hours) at 04/28/15 1256 Last data filed at 04/28/15 1100  Gross per 24  hour  Intake    240 ml  Output    350 ml  Net   -110 ml   Filed Weights   04/24/15 1030 04/24/15 1521  Weight: 32.659 kg (72 lb) 43.5 kg (95 lb 14.4 oz)    Exam Awake Alert,  No new F.N deficits, Normal affect [place].AT,PERRAL Supple Neck,No JVD, No cervical lymphadenopathy appriciated.  Symmetrical Chest wall movement, Good air movement bilaterally, CTAB RRR,No Gallops,Rubs or new Murmurs, No Parasternal Heave +ve B.Sounds, Abd Soft, Non tender, No organomegaly appriciated, No rebound -guarding or rigidity. No Cyanosis, Clubbing or edema, No new Rash or bruise  DISCHARGE CONDITION: Stable  DISPOSITION: SNF  DISCHARGE INSTRUCTIONS:    Activity:  As tolerated with Full fall precautions use walker/cane & assistance as needed  Get Medicines reviewed and adjusted: Please take all your medications with you for your next visit with your Primary MD  Please request your Primary MD to go over all hospital tests and procedure/radiological results at the follow up, please ask your Primary MD to get all Hospital records sent to his/her office.  If you experience worsening of your admission symptoms, develop shortness of breath, life threatening emergency, suicidal or homicidal thoughts you must seek medical attention immediately by calling 911 or calling your MD immediately  if symptoms less severe.  You must read complete instructions/literature  along with all the possible adverse reactions/side effects for all the Medicines you take and that have been prescribed to you. Take any new Medicines after you have completely understood and accpet all the possible adverse reactions/side effects.   Do not drive when taking Pain medications.   Do not take more than prescribed Pain, Sleep and Anxiety Medications  Special Instructions: If you have smoked or chewed Tobacco  in the last 2 yrs please stop smoking, stop any regular Alcohol  and or any Recreational drug use.  Wear Seat belts while driving.  Please note  You were cared for by a hospitalist during your hospital stay. Once you are discharged, your primary care physician will handle any further medical issues. Please note that NO REFILLS for any discharge medications will be authorized once you are discharged, as it is imperative that you return to your primary care physician (or establish a relationship with a primary care physician if you do not have one) for your aftercare needs so that they can reassess your need for medications and monitor your lab values.  Diet recommendation: Diabetic Diet Heart Healthy diet Dysphagia 3 diet  Discharge Instructions    Diet - low sodium heart healthy    Complete by:  As directed   Dys 3 diet     Increase activity slowly    Complete by:  As directed            Follow-up Information    Follow up with Julian HySOUTH,STEPHEN ALAN, MD. Schedule an appointment as soon as possible for a visit in 2 weeks.   Specialty:  Endocrinology   Why:  Hospital follow up   Contact information:   [address] [place] [address] [phone]      Total Time spent on discharge equals 45 minutes.  SignedJeoffrey Massed: GHIMIRE,SHANKER 04/28/2015 12:56 PM

## 2015-04-28 NOTE — Progress Notes (Signed)
Physical Therapy Treatment Patient Details Name: Beth Payne MRN: [id number] DOB: 03/03/1941 Today's Date: 04/28/2015    History of Present Illness pt is a 74 y/o female with h/o DM and prior DKA's, CAD and HTN, admitted with confusion and elevated CBG's and DKA.    PT Comments    Pt stated she was not feeling well at the start of the treatment; pt stated she felt nauseous. Pt was able to sit EOB ~7 minutes. Pt attempted to stand from bed x 2 trials but was unable to take steps due to feeling "weak". Pt was able to complete exercises but stated she still felt nauseous and wanted water. SPTA called nurse to ask if pt could have water, nurse is checking with the doctor. Pt required increased time to process information/questions being asked. Pt stated she did not want to sit up in the recliner the two times SPTA asked. Pt given HEP handout at the end of treatment. SPTA consulted with supervising PT about changing discharge recommendation to SNF.   Follow Up Recommendations  SNF;Supervision/Assistance - 24 hour           Precautions / Restrictions Precautions Precautions: Fall Restrictions Weight Bearing Restrictions: No    Mobility  Bed Mobility Overal bed mobility: Needs Assistance Bed Mobility: Supine to Sit;Sit to Supine     Supine to sit: Min assist Sit to supine: Supervision   General bed mobility comments: Pt requires min assistance (hand held assist) to sit up in bed. Although pt requires supervision for sit to supine, min assistance is needed to realine pt/scoot up in bed once in supine.  Transfers Overall transfer level: Needs assistance Equipment used: Rolling walker (2 wheeled) Transfers: Sit to/from Stand Sit to Stand: Min assist         General transfer comment: Pt required min assistance to stand up from bed. Pt performed 2 trials of standing from bed.                           Balance Overall balance assessment: Needs  assistance Sitting-balance support: Feet supported;Bilateral upper extremity supported Sitting balance-Leahy Scale: Poor Sitting balance - Comments: Pt leaned to the right while sitting EOB. Pt stated she did not feel comfortable sitting EOB without somebody there in front of her.                            Cognition Arousal/Alertness: Awake/alert Behavior During Therapy: WFL for tasks assessed/performed Overall Cognitive Status: No family/caregiver present to determine baseline cognitive functioning                      Exercises General Exercises - Lower Extremity Ankle Circles/Pumps: AROM;AAROM;Both;20 reps;Seated Long Arc Quad: AROM;AAROM;Both;10 reps;Seated Heel Slides: AROM;AAROM;Both;10 reps;Seated Hip ABduction/ADduction: AROM;AAROM;Both;10 reps;Seated Straight Leg Raises: AROM;AAROM;Both;10 reps;Seated Hip Flexion/Marching: AROM;AAROM;Both;10 reps;Seated        Pertinent Vitals/Pain Pain Assessment: 0-10 Pain Score: 6  Pain Location: right abdomen, above right eye Pain Intervention(s): Monitored during session;Patient requesting pain meds-RN notified              Frequency  Min 3X/week    PT Plan Other (comment) (SPTA will consult with PT about changing d/c plan)       End of Session Equipment Utilized During Treatment: Gait belt Activity Tolerance: Patient limited by fatigue Patient left: in bed;with call bell/phone within reach     Time:  1610-9604 PT Time Calculation (min) (ACUTE ONLY): 37 min  Charges:   1 Gait 1 [address] [place] [phone] OFFICE  04/28/2015, 4:27 PM

## 2015-04-28 NOTE — Progress Notes (Signed)
Inpatient Diabetes Program Recommendations  AACE/ADA: New Consensus Statement on Inpatient Glycemic Control (2015)  Target Ranges:  Prepandial:   less than 140 mg/dL      Peak postprandial:   less than 180 mg/dL (1-2 hours)      Critically ill patients:  140 - 180 mg/dL   Review of Glycemic Control  Diabetes history: Type 1 Outpatient Diabetes medications: Insulin pump, but not appropriate given dementia Current orders for Inpatient glycemic control: Lantus 18 units at HS, Sensitive Novolog correction tid with meals  Results for Theron AristaGANT, Shaylyn C (MRN [id number]) as of 04/28/2015 08:59  Ref. Range 04/27/2015 07:48 04/27/2015 12:15 04/27/2015 15:02 04/27/2015 17:00 04/27/2015 22:19 04/28/2015 07:44  Glucose-Capillary Latest Ref Range: 65-99 mg/dL 782181 (H) 956313 (H) 213210 (H) 210 (H) 321 (H) 246 (H)    Inpatient Diabetes Program Recommendations: Note that Lantus insulin has been increased from 14 units to 18 units beginning tonight.  Patient's intake has been 0 to 10%.    Since patient has type 1 diabetes, once patient begins to eat, please consider low dose Novolog meal coverage beginning cautiously with 2-3 units and titrate upward as indicated. (Given patient's dementia, tight glycemic control is not an appropriate goal for safety reasons.)   Insulin adjustments will need to continue at SNF. Thank you.  Bruna Dills S. Elsie [place]outh, RN, CNS, CDE Inpatient Diabetes Program, team pager [phone] (8am to 5pm)

## 2015-04-28 NOTE — Progress Notes (Signed)
Pt a/o forgetful at times, pt unable to take all of her 2200 meds d/t c/o nausea, PRN zofran given as ordered, RN went back later to round on pt and see if she wanted to try her Aricept she was unable to take at 2200, pt then c/o heartburn and PRN Maalox given as ordered and pt refused to try to take Aricept, HOB elevated and pt resting comfortably in bed

## 2015-04-28 NOTE — Progress Notes (Signed)
[facility] Report called and given to nurse.  IV removed.  Pt taken to facility by PTAR.

## 2015-04-28 NOTE — Progress Notes (Deleted)
Physical Therapy Treatment Patient Details Name: Beth Payne MRN: [id number] DOB: August 06, 1940 Today's Date: 04/28/2015    History of Present Illness pt is a 74 y/o female with h/o DM and prior DKA's, CAD and HTN, admitted with confusion and elevated CBG's and DKA.    PT Comments    Pt stated she was not feeling well at the start of the treatment; pt stated she felt nauseous. Pt attempted to stand from bed x 2 trials but was unable to take steps due to feeling "weak". Pt was able to complete exercises but stated she still felt nauseous and wanted water. SPTA called nurse to ask if pt could have water, nurse is checking with the doctor. Pt required increased time to process information/questions being asked. Pt stated she did not want to sit up in the recliner the two times SPTA asked. Pt given HEP handout at the end of treatment. SPTA will consult with supervising PT about changing discharge recommendation to SNF.           Precautions / Restrictions Precautions Precautions: Fall Restrictions Weight Bearing Restrictions: No    Mobility  Bed Mobility Overal bed mobility: Needs Assistance Bed Mobility: Supine to Sit;Sit to Supine     Supine to sit: Min assist Sit to supine: Supervision   General bed mobility comments: Pt requires min assistance (hand held assist) to sit up in bed. Although pt requires supervision for sit to supine, min assistance is needed to realine pt/scoot up in bed once in supine.  Transfers Overall transfer level: Needs assistance Equipment used: Rolling walker (2 wheeled) Transfers: Sit to/from Stand Sit to Stand: Min assist         General transfer comment: Pt required min assistance to stand up from bed. Pt performed 2 trials of standing from bed.                                      Balance Overall balance assessment: Needs assistance Sitting-balance support: Feet supported;Bilateral upper extremity supported Sitting  balance-Leahy Scale: Poor Sitting balance - Comments: Pt leaned to the right while sitting EOB. Pt stated she did not feel comfortable sitting EOB without somebody there in front of her.                            Cognition Arousal/Alertness: Awake/alert Behavior During Therapy: WFL for tasks assessed/performed Overall Cognitive Status: No family/caregiver present to determine baseline cognitive functioning                      Exercises General Exercises - Lower Extremity Ankle Circles/Pumps: AROM;AAROM;Both;20 reps;Seated Long Arc Quad: AROM;AAROM;Both;10 reps;Seated Heel Slides: AROM;AAROM;Both;10 reps;Seated Hip ABduction/ADduction: AROM;AAROM;Both;10 reps;Seated Straight Leg Raises: AROM;AAROM;Both;10 reps;Seated Hip Flexion/Marching: AROM;AAROM;Both;10 reps;Seated        Pertinent Vitals/Pain Pain Assessment: 0-10 Pain Score: 6  Pain Location: right abdomen, above right eye Pain Intervention(s): Monitored during session;Patient requesting pain meds-RN notified                         End of Session Equipment Utilized During Treatment: Gait belt Activity Tolerance: Patient limited by fatigue Patient left: in bed;with call bell/phone within reach     Time: 4098-1191 PT Time Calculation (min) (ACUTE ONLY): 37 min  Charges:    1 TE 1 TA  Laurena SlimmerKayli Brodric Schauer, [place] [phone] OFFICE  04/28/2015, 4:24 PM

## 2015-04-28 NOTE — Clinical Social Work Placement (Signed)
   CLINICAL SOCIAL WORK PLACEMENT  NOTE  Date:  04/28/2015  Patient Details  Name: Beth Payne MRN: [id number] Date of Birth: 07/12/1940  Clinical Social Work is seeking post-discharge placement for this patient at the Skilled  Nursing Facility level of care (*CSW will initial, date and re-position this form in  chart as items are completed):  Yes   Patient/family provided with [facility] Clinical Social Work Department's list of facilities offering this level of care within the geographic area requested by the patient (or if unable, by the patient's family).  Yes   Patient/family informed of their freedom to choose among providers that offer the needed level of care, that participate in Medicare, Medicaid or managed care program needed by the patient, have an available bed and are willing to accept the patient.  Yes   Patient/family informed of [facility]'s ownership interest in [facility] and [facility], as well as of the fact that they are under no obligation to receive care at these facilities.  PASRR submitted to EDS on 04/27/15     PASRR number received on 04/27/15     Existing PASRR number confirmed on       FL2 transmitted to all facilities in geographic area requested by pt/family on 04/27/15     FL2 transmitted to all facilities within larger geographic area on       Patient informed that his/her managed care company has contracts with or will negotiate with certain facilities, including the following:        Yes   Patient/family informed of bed offers received.  [facility]     Physician recommends and patient chooses bed at      [facility] to [facility] 04[facility]28/15.  Patient to be transferred to facility by [facility]     Patient family notified on 04/28/15 of transfer.  Name of family member notified:  [name]     PHYSICIAN       Additional Comment:     _______________________________________________ Rod MaeVaughn, Price Lachapelle S, LCSW 04/28/2015, 1:06 PM

## 2015-04-29 ENCOUNTER — Non-Acute Institutional Stay (SKILLED_NURSING_FACILITY): Payer: Medicare Other | Admitting: Adult Health

## 2015-04-29 ENCOUNTER — Encounter: Payer: Self-pay | Admitting: Adult Health

## 2015-04-29 DIAGNOSIS — F419 Anxiety disorder, unspecified: Secondary | ICD-10-CM

## 2015-04-29 DIAGNOSIS — E1010 Type 1 diabetes mellitus with ketoacidosis without coma: Secondary | ICD-10-CM | POA: Diagnosis not present

## 2015-04-29 DIAGNOSIS — D509 Iron deficiency anemia, unspecified: Secondary | ICD-10-CM

## 2015-04-29 DIAGNOSIS — E46 Unspecified protein-calorie malnutrition: Secondary | ICD-10-CM

## 2015-04-29 DIAGNOSIS — E785 Hyperlipidemia, unspecified: Secondary | ICD-10-CM | POA: Diagnosis not present

## 2015-04-29 DIAGNOSIS — K5900 Constipation, unspecified: Secondary | ICD-10-CM | POA: Diagnosis not present

## 2015-04-29 DIAGNOSIS — R1310 Dysphagia, unspecified: Secondary | ICD-10-CM

## 2015-04-29 DIAGNOSIS — I10 Essential (primary) hypertension: Secondary | ICD-10-CM

## 2015-04-29 DIAGNOSIS — R5381 Other malaise: Secondary | ICD-10-CM | POA: Diagnosis not present

## 2015-04-29 DIAGNOSIS — I2510 Atherosclerotic heart disease of native coronary artery without angina pectoris: Secondary | ICD-10-CM

## 2015-04-29 DIAGNOSIS — K920 Hematemesis: Secondary | ICD-10-CM | POA: Diagnosis not present

## 2015-04-29 DIAGNOSIS — R110 Nausea: Secondary | ICD-10-CM

## 2015-04-29 DIAGNOSIS — F329 Major depressive disorder, single episode, unspecified: Secondary | ICD-10-CM

## 2015-04-29 DIAGNOSIS — F0390 Unspecified dementia without behavioral disturbance: Secondary | ICD-10-CM | POA: Diagnosis not present

## 2015-04-30 ENCOUNTER — Non-Acute Institutional Stay (SKILLED_NURSING_FACILITY): Payer: Medicare Other | Admitting: Internal Medicine

## 2015-04-30 DIAGNOSIS — E108 Type 1 diabetes mellitus with unspecified complications: Secondary | ICD-10-CM

## 2015-04-30 DIAGNOSIS — K5900 Constipation, unspecified: Secondary | ICD-10-CM | POA: Diagnosis not present

## 2015-04-30 DIAGNOSIS — F0390 Unspecified dementia without behavioral disturbance: Secondary | ICD-10-CM | POA: Diagnosis not present

## 2015-04-30 DIAGNOSIS — F329 Major depressive disorder, single episode, unspecified: Secondary | ICD-10-CM

## 2015-04-30 DIAGNOSIS — G4700 Insomnia, unspecified: Secondary | ICD-10-CM | POA: Diagnosis not present

## 2015-04-30 DIAGNOSIS — R1310 Dysphagia, unspecified: Secondary | ICD-10-CM

## 2015-04-30 DIAGNOSIS — I10 Essential (primary) hypertension: Secondary | ICD-10-CM

## 2015-04-30 DIAGNOSIS — E46 Unspecified protein-calorie malnutrition: Secondary | ICD-10-CM

## 2015-04-30 DIAGNOSIS — R5381 Other malaise: Secondary | ICD-10-CM

## 2015-04-30 DIAGNOSIS — N179 Acute kidney failure, unspecified: Secondary | ICD-10-CM | POA: Diagnosis not present

## 2015-04-30 DIAGNOSIS — D72829 Elevated white blood cell count, unspecified: Secondary | ICD-10-CM | POA: Diagnosis not present

## 2015-04-30 DIAGNOSIS — D539 Nutritional anemia, unspecified: Secondary | ICD-10-CM | POA: Diagnosis not present

## 2015-04-30 DIAGNOSIS — K226 Gastro-esophageal laceration-hemorrhage syndrome: Secondary | ICD-10-CM | POA: Diagnosis not present

## 2015-04-30 DIAGNOSIS — E785 Hyperlipidemia, unspecified: Secondary | ICD-10-CM

## 2015-04-30 NOTE — Progress Notes (Signed)
Patient ID: Beth Payne, female   DOB: [birth year], 74 y.o.   MRN: [id number]     [facility]   PCP: [name], MD  Code Status: DNR   Allergies  Allergen Reactions  . Diclofenac Sodium Swelling and Other (See Comments)    Blurred vision  . Doxycycline Calcium Swelling and Other (See Comments)    Blurred vision  . Codeine Nausea And Vomiting and Other (See Comments)    Dizziness   . Zocor [Simvastatin] Other (See Comments)    Dizziness, weakness     Chief Complaint  Patient presents with  . New Admit To SNF     HPI:  74 y.o. patient is here for short term rehabilitation post hospital admission from 04/24/15-11/21/16with diabetic ketoacidosis and transient hematemesis. She required iv fluid, iv insulin and later lantus. Her insulin pump has been discontinued for now. There was concern for mallory weiss tear with her hematemesis and gi was consulted and recommended PPI for 6-8 weeks.  Her AKI resolved with fluids. She has PMH of type 1 DM, HTN, dementia among others. She is seen in her room today. She is sitting on her wheelchair and is in no distress. Denies any concerns this am.  Review of Systems:  Constitutional: Negative for fever, chills, diaphoresis.  HENT: Negative for headache, congestion, nasal discharge. Has some difficulty swallowing.   Eyes: Negative for eye pain, blurred vision, double vision and discharge.  Respiratory: Negative for cough, shortness of breath and wheezing.   Cardiovascular: Negative for chest pain, palpitations, leg swelling.  Gastrointestinal: appetite is coming around. Negative for heartburn, vomiting, abdominal pain. Had been constipated and after stool softner and laxative, had a bowel movement this am. Feels sore in her abdomen.  Genitourinary: Negative for dysuria.  Musculoskeletal: Negative for back pain, falls. Skin: Negative for itching, rash.  Neurological: Negative for dizziness, tingling,  focal weakness Psychiatric/Behavioral: positive for insomnia. Negative for depression.    Past Medical History  Diagnosis Date  . Coronary artery disease   . Depression   . Retinopathy   . Anxiety   . Heart murmur   . TIA (transient ischemic attack)   . Arthritis     "fingers" (12/23/2014)  . Fibromyalgia   . Type I diabetes mellitus (HCC) dx'd 1953    "I'm on a pup" (12/23/2014)  . Chronic lower back pain   . GERD (gastroesophageal reflux disease) dx'd 1995  . Osteopenia dx'd 2007   Past Surgical History  Procedure Laterality Date  . Cataract extraction w/ intraocular lens  implant, bilateral Bilateral 1998  . Tonsillectomy    . Appendectomy    . Bilateral carpal tunnel release Bilateral 2003-2004  . Back surgery    . Anterior cervical decomp/discectomy fusion  05/2003    "C5-6"  . Abdominal hysterectomy  1972  . Dilation and curettage of uterus    . Cesarean section    . Eye surgery Bilateral since 1977    "numerous slaser surgeries for retinopathy"  . Trigger finger release Bilateral 2003  . Cervical disc surgery  2004    "collapsed C-5"  . Fracture surgery    . Patella fracture surgery Left 04/2007  . Vertebroplasty  01/2008    "compressed lumbar fracture"  . Laparoscopic lysis of adhesions  1975-1987 X 5  . Exploratory laparotomy  [phone] X 1    "adhesions"  . Oophorectomy Bilateral 1980's  . Vitrectomy Right 08/1997  . Bunionectomy with hammertoe reconstruction Right  02/1999  . Coronary angioplasty with stent placement  07/1999    "?1"  . Lumbar microdiscectomy  03/2005  . Orif ankle fracture Left 12/25/2014    Procedure: OPEN REDUCTION INTERNAL FIXATION (ORIF) LEFT TRIMALLEOLAR ANKLE FRACTURE;  Surgeon: Tarry Kos, MD;  Location: [facility] OR;  Service: Orthopedics;  Laterality: Left;   Social History:   reports that she has been smoking Cigarettes.  She has a 27 pack-year smoking history. She has never used smokeless tobacco. She reports that she does not drink  alcohol or use illicit drugs.  Family History  Problem Relation Age of Onset  . Liver disease Sister   . Colon cancer Brother     Medications:   Medication List       This list is accurate as of: 04/30/15  5:36 PM.  Always use your most recent med list.               acetaminophen 325 MG tablet  Commonly known as:  TYLENOL  Take 2 tablets (650 mg total) by mouth every 6 (six) hours as needed for mild pain (or Fever >/= 101).     amLODipine 5 MG tablet  Commonly known as:  NORVASC  Take 1 tablet (5 mg total) by mouth daily.     ARIPiprazole 5 MG tablet  Commonly known as:  ABILIFY  Take 1 tablet (5 mg total) by mouth every morning.     aspirin EC 81 MG tablet  Take 1 tablet (81 mg total) by mouth daily.     donepezil 10 MG tablet  Commonly known as:  ARICEPT  Take 10 mg by mouth at bedtime.     feeding supplement (GLUCERNA SHAKE) Liqd  Take 237 mLs by mouth 3 (three) times daily between meals.     ferrous sulfate 325 (65 FE) MG tablet  Take 325 mg by mouth 2 (two) times daily.     insulin aspart 100 UNIT/ML injection  Commonly known as:  novoLOG  0-9 Units, Subcutaneous, 3 times daily with meals CBG < 70: implement hypoglycemia protocol CBG 70 - 120: 0 units CBG 121 - 150: 1 unit CBG 151 - 200: 2 units CBG 201 - 250: 3 units CBG 251 - 300: 5 units CBG 301 - 350: 7 units CBG 351 - 400: 9 units CBG > 400: call MD     insulin glargine 100 UNIT/ML injection  Commonly known as:  LANTUS  Inject 0.18 mLs (18 Units total) into the skin at bedtime.     LAMICTAL 100 MG tablet  Generic drug:  lamoTRIgine  Take 50 mg by mouth daily.     LORazepam 0.5 MG tablet  Commonly known as:  ATIVAN  Take 1 tablet (0.5 mg total) by mouth 3 (three) times daily as needed. Anxiety     multivitamin with minerals Tabs tablet  Take 1 tablet by mouth daily.     pantoprazole 40 MG tablet  Commonly known as:  PROTONIX  Take 1 tablet (40 mg total) by mouth 2 (two) times daily.      polyethylene glycol packet  Commonly known as:  MIRALAX / GLYCOLAX  Take 17 g by mouth daily as needed for mild constipation.     prazosin 1 MG capsule  Commonly known as:  MINIPRESS  Take 1 mg by mouth daily.     PROCEL Powd  Take 1 scoop by mouth 2 (two) times daily.     rosuvastatin 5 MG tablet  Commonly known  as:  CRESTOR  Take 5 mg by mouth daily.     sennosides-docusate sodium 8.6-50 MG tablet  Commonly known as:  SENOKOT-S  Take 2 tablets by mouth 2 (two) times daily.     sertraline 100 MG tablet  Commonly known as:  ZOLOFT  Take 150 mg by mouth daily.     Vitamin D3 2000 UNITS Tabs  Take by mouth daily. For supplement         Physical Exam Filed Vitals:   04/30/15 1716  BP: 132/60  Pulse: 78  Temp: 98.1 F (36.7 C)  Resp: 16  SpO2: 98%    General- elderly female, thin built, in no acute distress Head- normocephalic, atraumatic Nose- normal nasal mucosa, no maxillary or frontal sinus tenderness, no nasal discharge Throat- moist mucus membrane Eyes- PERRLA, EOMI, no pallor, no icterus, no discharge, normal conjunctiva, normal sclera Neck- no cervical lymphadenopathy Cardiovascular- normal s1,s2, no murmurs, no leg edema Respiratory- bilateral clear to auscultation, no wheeze, no rhonchi, no crackles, no use of accessory muscles Abdomen- bowel sounds present, soft, non tender Musculoskeletal- able to move all 4 extremities, generalized weakness  Neurological- no focal deficit, alert and oriented to person, place and time Skin- warm and dry Psychiatry- normal mood and affect    Labs reviewed: Basic Metabolic Panel:  Recent Labs  16/03/9606/07/16 1320  02/25/15 0307 02/26/15 0242 02/27/15 0535  04/24/15 1838 04/24/15 2308 04/25/15 0315  NA 135  < > 137 136  --   < > 144 143 140  K 5.0  < > 3.0* 3.6  --   < > 3.0* 4.1 3.7  CL 103  < > 107 108  --   < > 111 111 110  CO2 14*  < > 19* 23  --   < > 20* 19* 18*  GLUCOSE 560*  < > 173* 103*  --   < > 262*  123* 204*  BUN 27*  < > 8 <5*  --   < > 16 17 16   CREATININE 1.72*  < > 0.78 0.57  --   < > 1.13* 1.00 1.05*  CALCIUM 9.6  < > 8.7* 9.1  --   < > 9.8 10.1 9.8  MG 1.9  < > 1.8 1.5* 2.0  --   --   --   --   PHOS 4.0  --   --  1.1*  --   --   --   --   --   < > = values in this interval not displayed. Liver Function Tests:  Recent Labs  12/31/14 2052 02/23/15 1427 02/26/15 0242  AST 29 31 38  ALT 15 24 30   ALKPHOS 70 87 72  BILITOT 0.4 0.9 0.6  PROT 5.9* 6.9 5.7*  ALBUMIN 3.1* 4.0 3.3*    Recent Labs  12/12/14 1010  LIPASE 21*    Recent Labs  12/31/14 2056  AMMONIA 17   CBC:  Recent Labs  12/12/14 1010  12/23/14 1042  02/23/15 1428  04/24/15 1838 04/25/15 0315 04/26/15 0402  WBC 7.5  < > 7.8  < > 20.3*  < > 14.8* 14.8* 10.7*  NEUTROABS 6.2  --  6.2  --  16.6*  --   --   --   --   HGB 10.4*  < > 10.6*  < > 10.2*  < > 10.4* 9.6* 10.5*  HCT 32.1*  < > 32.2*  < > 31.6*  < > 32.3* 29.4* 33.1*  MCV 85.4  < > 83.6  < > 85.2  < > 84.6 84.5 84.2  PLT 196  < > 304  < > 288  < > 267 233 232  < > = values in this interval not displayed. Cardiac Enzymes:  Recent Labs  04/24/15 1300 04/24/15 1838 04/25/15 0315  TROPONINI 0.04* 0.07* 0.07*   BNP: Invalid input(s): POCBNP CBG:  Recent Labs  04/28/15 0744 04/28/15 1150 04/28/15 1656  GLUCAP 246* 117* 204*    Radiological Exams: Dg Chest Port 1 View  04/25/2015  CLINICAL DATA:  Leukocytosis EXAM: PORTABLE CHEST 1 VIEW COMPARISON:  02/23/2015 FINDINGS: Cardiomediastinal silhouette is stable. No acute infiltrate or pleural effusion. No pulmonary edema. Stable scarring in lingula. Central mild bronchitic changes. Prior vertebroplasty lower thoracic spine again noted. Degenerative changes bilateral shoulders. IMPRESSION: No acute infiltrate or pulmonary edema. Central mild bronchitic changes. Electronically Signed   By: Natasha Mead M.D.   On: 04/25/2015 09:02     Assessment/Plan  Physical deconditioning With  DKA, AKI and hematemesis. Will have her work with physical therapy and occupational therapy team to help with gait training and muscle strengthening exercises.fall precautions. Skin care. Encourage to be out of bed.   Diabetes mellitus type 1 Monitor cbg, continue lantus 18 u daily with novolog SSI tid with meals Lab Results  Component Value Date   HGBA1C 8.1* 02/23/2015   Mallory weiss tear Denies hematemesis and retching. Continue protonix 40 mg bid for another 6 weeks. Monitor  Anemia Her hematemesis, acute illness and chronic diseases could all be contributing to this. Monitor cbc. Continue ferrous sulfate 325 mg bid for now  Insomnia Start melatonin 3 mg qhs and monitor  Dysphagia SLP consult to evaluate for this and cognition  Dementia Stable, no behavioral disturbance in the facility. Continue aricept 10 mg daily  Protein calorie malnutrition Continue protein supplement, monitor weight  Constipation Off colace. Currently on miralax bid with senna s 2 tab bid. Had bowel movement this am. Change miralax to daily only to avoid diarrhea. Hydration encouraged  Leukocytosis Infectious etiology ruled out in hospital. Afebrile. Monitor cbc with diff  AKI Avoid NSAIDs, monitor bmp  Chronic depression Continue abilify 5 mg daily, lamictal 50 mg daily and zoloft 150 mg daily  HTN Stable bp, continue norvasc 5 mg daily with prazosin 1 mg daily and monitor bp readings  Hyperlipidemia Continue crestor 5 mg daily   Anemia   Goals of care: short term rehabilitation   Labs/tests ordered: cbc, cmp  Family/ staff Communication: reviewed care plan with patient and nursing supervisor    Oneal Grout, MD  [facility] [phone] (Monday-Friday 8 am - 5 pm) [phone] (afterhours)

## 2015-05-07 NOTE — Progress Notes (Signed)
Patient ID: Beth Payne, female   DOB: 11/24/1940, 74 y.o.   MRN: [id number]    DATE:  04/29/15  MRN:  [id number]  BIRTHDAY: 15-Apr-1941  Facility:  Nursing Home Location:  [facility]  Nursing Home Room Number: 1104-1  LEVEL OF CARE:  SNF [phone])  Contact Information    Name Relation Home Work [place] Friend [phone]  [phone]   No name specified       Juliann Mule [phone]  [phone]      Chief Complaint  Patient presents with  . Hospitalization Follow-up    Physical deconditioning, diabetic ketoacidosis, hematemesis, hypertension, dementia, dysphagia, depression, CAD, anxiety, anemia, constipation, hyperlipidemia and protein calorie malnutrition    HISTORY OF PRESENT ILLNESS:  This is a 74 year old female who has been admitted to [facility] on 04/28/15 from [facility]. She has PMH of the prior DKAs, CAD, hypertension and GERD. She was recently restarted on insulin pump. However, she had problems with it. It was suspected that the insulin pump function. She has dementia and has recurrent admissions for DKA. She is not a candidate for insulin pump and was transitioned to subcutaneous insulin. She was treated for DKA with insulin drip and IVF. She had coffee ground to emesis is and consulted GI and recommended to have Protonix 40 mg twice a day 6 weeks. Latest hemoglobin is 10.5, not too low.  She has been admitted for a short-term rehabilitation.  PAST MEDICAL HISTORY:  Past Medical History  Diagnosis Date  . Coronary artery disease   . Depression   . Retinopathy   . Anxiety   . Heart murmur   . TIA (transient ischemic attack)   . Arthritis     "fingers" (12/23/2014)  . Fibromyalgia   . Type I diabetes mellitus (HCC) dx'd 1953    "I'm on a pup" (12/23/2014)  . Chronic lower back pain   . GERD (gastroesophageal reflux disease) dx'd 1995  . Osteopenia dx'd 2007    CURRENT MEDICATIONS: Reviewed  Patient's  Medications  New Prescriptions   No medications on file  Previous Medications   ACETAMINOPHEN (TYLENOL) 325 MG TABLET    Take 2 tablets (650 mg total) by mouth every 6 (six) hours as needed for mild pain (or Fever >/= 101).   AMLODIPINE (NORVASC) 5 MG TABLET    Take 1 tablet (5 mg total) by mouth daily.   ARIPIPRAZOLE (ABILIFY) 5 MG TABLET    Take 1 tablet (5 mg total) by mouth every morning.   ASPIRIN EC 81 MG TABLET    Take 1 tablet (81 mg total) by mouth daily.   CHOLECALCIFEROL (VITAMIN D3) 2000 UNITS TABS    Take by mouth daily. For supplement   DONEPEZIL (ARICEPT) 10 MG TABLET    Take 10 mg by mouth at bedtime.   FEEDING SUPPLEMENT, GLUCERNA SHAKE, (GLUCERNA SHAKE) LIQD    Take 237 mLs by mouth 3 (three) times daily between meals.   FERROUS SULFATE 325 (65 FE) MG TABLET    Take 325 mg by mouth 2 (two) times daily.   INSULIN ASPART (NOVOLOG) 100 UNIT/ML INJECTION    0-9 Units, Subcutaneous, 3 times daily with meals CBG < 70: implement hypoglycemia protocol CBG 70 - 120: 0 units CBG 121 - 150: 1 unit CBG 151 - 200: 2 units CBG 201 - 250: 3 units CBG 251 - 300: 5 units CBG 301 - 350: 7 units CBG 351 - 400: 9 units  CBG > 400: call MD   INSULIN GLARGINE (LANTUS) 100 UNIT/ML INJECTION    Inject 0.18 mLs (18 Units total) into the skin at bedtime.   LAMOTRIGINE (LAMICTAL) 100 MG TABLET    Take 50 mg by mouth daily.   LORAZEPAM (ATIVAN) 0.5 MG TABLET    Take 1 tablet (0.5 mg total) by mouth 3 (three) times daily as needed. Anxiety   MULTIPLE VITAMIN (MULITIVITAMIN WITH MINERALS) TABS    Take 1 tablet by mouth daily.   PANTOPRAZOLE (PROTONIX) 40 MG TABLET    Take 1 tablet (40 mg total) by mouth 2 (two) times daily.   POLYETHYLENE GLYCOL (MIRALAX / GLYCOLAX) PACKET    Take 17 g by mouth daily as needed for mild constipation.   PRAZOSIN (MINIPRESS) 1 MG CAPSULE    Take 1 mg by mouth daily.   PROTEIN (PROCEL) POWD    Take 1 scoop by mouth 2 (two) times daily.   ROSUVASTATIN (CRESTOR) 5 MG  TABLET    Take 5 mg by mouth daily.   SENNOSIDES-DOCUSATE SODIUM (SENOKOT-S) 8.6-50 MG TABLET    Take 2 tablets by mouth 2 (two) times daily.   SERTRALINE (ZOLOFT) 100 MG TABLET    Take 150 mg by mouth daily.  Modified Medications   No medications on file  Discontinued Medications   ARIPIPRAZOLE (ABILIFY) 2 MG TABLET    Take 2 mg by mouth at bedtime.    DOCUSATE SODIUM (COLACE) 100 MG CAPSULE    Take 100 mg by mouth 2 (two) times daily.   OMEPRAZOLE (PRILOSEC) 20 MG CAPSULE    Take 40 mg by mouth daily.     Allergies  Allergen Reactions  . Diclofenac Sodium Swelling and Other (See Comments)    Blurred vision  . Doxycycline Calcium Swelling and Other (See Comments)    Blurred vision  . Codeine Nausea And Vomiting and Other (See Comments)    Dizziness   . Zocor [Simvastatin] Other (See Comments)    Dizziness, weakness      REVIEW OF SYSTEMS:  GENERAL: no change in appetite, no fatigue, no weight changes, no fever, chills or weakness EYES: Denies change in vision, dry eyes, eye pain, itching or discharge EARS: Denies change in hearing, ringing in ears, or earache NOSE: Denies nasal congestion or epistaxis MOUTH and THROAT: Denies oral discomfort, gingival pain or bleeding, pain from teeth or hoarseness   RESPIRATORY: no cough, SOB, DOE, wheezing, hemoptysis CARDIAC: no chest pain, edema or palpitations GI: no abdominal pain, diarrhea, heart burn, nausea or vomiting, +constipation GU: Denies dysuria, frequency, hematuria, incontinence, or discharge PSYCHIATRIC: Denies feeling of depression or anxiety. No report of hallucinations, insomnia, paranoia, or agitation   PHYSICAL EXAMINATION  GENERAL APPEARANCE: Well nourished. In no acute distress. Normal body habitus HEAD: Normal in size and contour. No evidence of trauma EYES: Lids open and close normally. No blepharitis, entropion or ectropion. PERRL. Conjunctivae are clear and sclerae are white. Lenses are without opacity EARS:  Pinnae are normal. Patient hears normal voice tunes of the examiner MOUTH and THROAT: Lips are without lesions. Oral mucosa is moist and without lesions. Tongue is normal in shape, size, and color and without lesions NECK: supple, trachea midline, no neck masses, no thyroid tenderness, no thyromegaly LYMPHATICS: no LAN in the neck, no supraclavicular LAN RESPIRATORY: breathing is even & unlabored, BS CTAB CARDIAC: RRR, no murmur,no extra heart sounds, no edema GI: abdomen soft, normal BS, no masses, no tenderness, no hepatomegaly, no splenomegaly EXTREMITIES:  Able to move 4 extremities PSYCHIATRIC: Alert and oriented X 3. Affect and behavior are appropriate  LABS/RADIOLOGY: Labs reviewed: Basic Metabolic Panel:  Recent Labs  40/34/7405/12/20 1320  02/25/15 0307 02/26/15 0242 02/27/15 0535  04/24/15 1838 04/24/15 2308 04/25/15 0315  NA 135  < > 137 136  --   < > 144 143 140  K 5.0  < > 3.0* 3.6  --   < > 3.0* 4.1 3.7  CL 103  < > 107 108  --   < > 111 111 110  CO2 14*  < > 19* 23  --   < > 20* 19* 18*  GLUCOSE 560*  < > 173* 103*  --   < > 262* 123* 204*  BUN 27*  < > 8 <5*  --   < > 16 17 16   CREATININE 1.72*  < > 0.78 0.57  --   < > 1.13* 1.00 1.05*  CALCIUM 9.6  < > 8.7* 9.1  --   < > 9.8 10.1 9.8  MG 1.9  < > 1.8 1.5* 2.0  --   --   --   --   PHOS 4.0  --   --  1.1*  --   --   --   --   --   < > = values in this interval not displayed. Liver Function Tests:  Recent Labs  12/31/14 2052 02/23/15 1427 02/26/15 0242  AST 29 31 38  ALT 15 24 30   ALKPHOS 70 87 72  BILITOT 0.4 0.9 0.6  PROT 5.9* 6.9 5.7*  ALBUMIN 3.1* 4.0 3.3*    Recent Labs  12/12/14 1010  LIPASE 21*    Recent Labs  12/31/14 2056  AMMONIA 17   CBC:  Recent Labs  12/12/14 1010  12/23/14 1042  02/23/15 1428  04/24/15 1838 04/25/15 0315 04/26/15 0402  WBC 7.5  < > 7.8  < > 20.3*  < > 14.8* 14.8* 10.7*  NEUTROABS 6.2  --  6.2  --  16.6*  --   --   --   --   HGB 10.4*  < > 10.6*  < > 10.2*  <  > 10.4* 9.6* 10.5*  HCT 32.1*  < > 32.2*  < > 31.6*  < > 32.3* 29.4* 33.1*  MCV 85.4  < > 83.6  < > 85.2  < > 84.6 84.5 84.2  PLT 196  < > 304  < > 288  < > 267 233 232  < > = values in this interval not displayed.  Cardiac Enzymes:  Recent Labs  04/24/15 1300 04/24/15 1838 04/25/15 0315  TROPONINI 0.04* 0.07* 0.07*   CBG:  Recent Labs  04/28/15 0744 04/28/15 1150 04/28/15 1656  GLUCAP 246* 117* 204*    Dg Esophagus  04/28/2015  CLINICAL DATA:  Painful and difficulty swallowing. EXAM: ESOPHOGRAM/BARIUM SWALLOW TECHNIQUE: Single contrast examination was performed using water soluble contrast. FLUOROSCOPY TIME:  Fluoroscopy Time:  1 minute 38 seconds COMPARISON:  None. FINDINGS: The patient ingested water-soluble contrast. There is no evidence of any leak from the esophagus. There were poor primary and secondary stripping waves. There is no obstruction or mass lesion. No visible esophagitis. The patient ingested a 13 mm barium tablet. It passed into the distal esophagus but did not pass into the stomach despite repeated swallows of water. I think this is more likely due to poor esophageal peristalsis and a true obstruction. The patient may have early achalasia. IMPRESSION:  No evidence of esophageal tear. 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 13 mm barium tablet never passed into the stomach. Electronically Signed   By: Francene Boyers M.D.   On: 04/28/2015 10:20   Dg Chest Port 1 View  04/25/2015  CLINICAL DATA:  Leukocytosis EXAM: PORTABLE CHEST 1 VIEW COMPARISON:  02/23/2015 FINDINGS: Cardiomediastinal silhouette is stable. No acute infiltrate or pleural effusion. No pulmonary edema. Stable scarring in lingula. Central mild bronchitic changes. Prior vertebroplasty lower thoracic spine again noted. Degenerative changes bilateral shoulders. IMPRESSION: No acute infiltrate or pulmonary edema. Central mild bronchitic changes. Electronically Signed   By: Natasha Mead M.D.    On: 04/25/2015 09:02    ASSESSMENT/PLAN:  Physical deconditioning - for rehabilitation  Diabetic ketoacidosis - follow-up with Dr. Evlyn Kanner, endocrinology, in 2 weeks; continue Lantus 18 units subcutaneous daily at bedtime and NovoLog sliding scale 3 times/day; check CMP  Hematemesis - GI consulted and recommended Protonix 40 mg by mouth twice a day 6 weeks; check CBC  Hypertension - continue amlodipine 5 mg 1 tab by mouth daily and prazosin 1 mg 1 capsule by mouth daily  Dementia - continue Aricept 10 mg 1 tab by mouth daily at bedtime  Dysphagia - S/P barium esophagogram, suspicion of motility disorder; follow-up with GI; for speech therapy evaluation and treatment  Depression - continue Zoloft 150 mg by mouth daily and Abilify 5 mg 1 tab by mouth in the morning and 2 mg 1 tab by mouth daily at bedtime  CAD - stable; continue aspirin 81 mg 1 tab by mouth daily  Anxiety - mood is stable; continue Ativan 0.5 mg 1 tab by mouth 3 times a day when necessary  Constipation - discontinue Colace increase MiraLAX to 17 g by mouth twice a day and start senna S2 tabs by mouth twice a day  Hyperlipidemia - continue Crestor 5 mg 1 tab by mouth daily  Protein calorie malnutrition - albumin 3.3; start Procel 1 scoop by mouth twice a day  Anemia, iron deficiency - hemoglobin 10.5; continue ferrous sulfate 325 mg 1 tab by mouth twice a day    Goals of care:  Short-term rehabilitation    [facility], NP [facility] [phone]

## 2015-05-19 ENCOUNTER — Observation Stay (HOSPITAL_COMMUNITY): Payer: Medicare Other

## 2015-05-19 ENCOUNTER — Encounter (HOSPITAL_COMMUNITY): Payer: Self-pay | Admitting: Emergency Medicine

## 2015-05-19 ENCOUNTER — Emergency Department (HOSPITAL_COMMUNITY): Payer: Medicare Other

## 2015-05-19 ENCOUNTER — Inpatient Hospital Stay (HOSPITAL_COMMUNITY)
Admission: EM | Admit: 2015-05-19 | Discharge: 2015-05-22 | DRG: 641 | Disposition: A | Discharge status: Skilled Nursing Facility | Payer: Medicare Other | Attending: Internal Medicine | Admitting: Internal Medicine

## 2015-05-19 DIAGNOSIS — R0902 Hypoxemia: Secondary | ICD-10-CM

## 2015-05-19 DIAGNOSIS — R1110 Vomiting, unspecified: Secondary | ICD-10-CM | POA: Diagnosis not present

## 2015-05-19 DIAGNOSIS — S199XXA Unspecified injury of neck, initial encounter: Secondary | ICD-10-CM | POA: Diagnosis not present

## 2015-05-19 DIAGNOSIS — M6282 Rhabdomyolysis: Secondary | ICD-10-CM | POA: Diagnosis not present

## 2015-05-19 DIAGNOSIS — E876 Hypokalemia: Secondary | ICD-10-CM | POA: Diagnosis not present

## 2015-05-19 DIAGNOSIS — F0390 Unspecified dementia without behavioral disturbance: Secondary | ICD-10-CM | POA: Diagnosis present

## 2015-05-19 DIAGNOSIS — R262 Difficulty in walking, not elsewhere classified: Secondary | ICD-10-CM

## 2015-05-19 DIAGNOSIS — Z681 Body mass index (BMI) 19 or less, adult: Secondary | ICD-10-CM

## 2015-05-19 DIAGNOSIS — S3993XA Unspecified injury of pelvis, initial encounter: Secondary | ICD-10-CM | POA: Diagnosis not present

## 2015-05-19 DIAGNOSIS — R918 Other nonspecific abnormal finding of lung field: Secondary | ICD-10-CM | POA: Diagnosis present

## 2015-05-19 DIAGNOSIS — S279XXA Injury of unspecified intrathoracic organ, initial encounter: Secondary | ICD-10-CM | POA: Diagnosis not present

## 2015-05-19 DIAGNOSIS — E1051 Type 1 diabetes mellitus with diabetic peripheral angiopathy without gangrene: Secondary | ICD-10-CM | POA: Diagnosis present

## 2015-05-19 DIAGNOSIS — I4519 Other right bundle-branch block: Secondary | ICD-10-CM | POA: Diagnosis not present

## 2015-05-19 DIAGNOSIS — R531 Weakness: Secondary | ICD-10-CM

## 2015-05-19 DIAGNOSIS — R251 Tremor, unspecified: Secondary | ICD-10-CM

## 2015-05-19 DIAGNOSIS — R296 Repeated falls: Secondary | ICD-10-CM

## 2015-05-19 DIAGNOSIS — W19XXXA Unspecified fall, initial encounter: Secondary | ICD-10-CM | POA: Diagnosis present

## 2015-05-19 DIAGNOSIS — D649 Anemia, unspecified: Secondary | ICD-10-CM | POA: Diagnosis present

## 2015-05-19 DIAGNOSIS — E10319 Type 1 diabetes mellitus with unspecified diabetic retinopathy without macular edema: Secondary | ICD-10-CM | POA: Diagnosis not present

## 2015-05-19 DIAGNOSIS — H02843 Edema of right eye, unspecified eyelid: Secondary | ICD-10-CM | POA: Diagnosis not present

## 2015-05-19 DIAGNOSIS — S2242XA Multiple fractures of ribs, left side, initial encounter for closed fracture: Secondary | ICD-10-CM | POA: Diagnosis present

## 2015-05-19 DIAGNOSIS — F17210 Nicotine dependence, cigarettes, uncomplicated: Secondary | ICD-10-CM | POA: Diagnosis present

## 2015-05-19 DIAGNOSIS — R079 Chest pain, unspecified: Secondary | ICD-10-CM | POA: Diagnosis not present

## 2015-05-19 DIAGNOSIS — Z794 Long term (current) use of insulin: Secondary | ICD-10-CM

## 2015-05-19 DIAGNOSIS — S0990XA Unspecified injury of head, initial encounter: Secondary | ICD-10-CM | POA: Diagnosis not present

## 2015-05-19 DIAGNOSIS — I10 Essential (primary) hypertension: Secondary | ICD-10-CM | POA: Diagnosis present

## 2015-05-19 HISTORY — DX: Unspecified dementia, unspecified severity, without behavioral disturbance, psychotic disturbance, mood disturbance, and anxiety: F03.90

## 2015-05-19 LAB — I-STAT TROPONIN, ED: Troponin i, poc: 0.05 ng/mL (ref 0.00–0.08)

## 2015-05-19 LAB — CBC WITH DIFFERENTIAL/PLATELET
BASOS ABS: 0 10*3/uL (ref 0.0–0.1)
Basophils Relative: 0 %
EOS PCT: 0 %
Eosinophils Absolute: 0 10*3/uL (ref 0.0–0.7)
HCT: 34.9 % — ABNORMAL LOW (ref 36.0–46.0)
Hemoglobin: 11.7 g/dL — ABNORMAL LOW (ref 12.0–15.0)
LYMPHS PCT: 4 %
Lymphs Abs: 0.5 10*3/uL — ABNORMAL LOW (ref 0.7–4.0)
MCH: 28.1 pg (ref 26.0–34.0)
MCHC: 33.5 g/dL (ref 30.0–36.0)
MCV: 83.7 fL (ref 78.0–100.0)
MONO ABS: 0.7 10*3/uL (ref 0.1–1.0)
MONOS PCT: 6 %
Neutro Abs: 11.2 10*3/uL — ABNORMAL HIGH (ref 1.7–7.7)
Neutrophils Relative %: 90 %
PLATELETS: 187 10*3/uL (ref 150–400)
RBC: 4.17 MIL/uL (ref 3.87–5.11)
RDW: 15.2 % (ref 11.5–15.5)
WBC: 12.4 10*3/uL — ABNORMAL HIGH (ref 4.0–10.5)

## 2015-05-19 LAB — BASIC METABOLIC PANEL
ANION GAP: 12 (ref 5–15)
BUN: 6 mg/dL (ref 6–20)
CALCIUM: 10 mg/dL (ref 8.9–10.3)
CHLORIDE: 103 mmol/L (ref 101–111)
CO2: 24 mmol/L (ref 22–32)
Creatinine, Ser: 0.74 mg/dL (ref 0.44–1.00)
Glucose, Bld: 260 mg/dL — ABNORMAL HIGH (ref 65–99)
POTASSIUM: 2.8 mmol/L — AB (ref 3.5–5.1)
SODIUM: 139 mmol/L (ref 135–145)

## 2015-05-19 LAB — CBG MONITORING, ED: Glucose-Capillary: 206 mg/dL — ABNORMAL HIGH (ref 65–99)

## 2015-05-19 LAB — CK: Total CK: 7957 U/L — ABNORMAL HIGH (ref 38–234)

## 2015-05-19 MED ORDER — HYDROCODONE-ACETAMINOPHEN 5-325 MG PO TABS
1.0000 | ORAL_TABLET | Freq: Once | ORAL | Status: AC
  Administered 2015-05-19: 1 via ORAL
  Filled 2015-05-19: qty 1

## 2015-05-19 MED ORDER — ONDANSETRON 4 MG PO TBDP
4.0000 mg | ORAL_TABLET | Freq: Once | ORAL | Status: AC
  Administered 2015-05-19: 4 mg via ORAL
  Filled 2015-05-19: qty 1

## 2015-05-19 MED ORDER — POTASSIUM CHLORIDE CRYS ER 20 MEQ PO TBCR
40.0000 meq | EXTENDED_RELEASE_TABLET | Freq: Once | ORAL | Status: AC
  Administered 2015-05-19: 40 meq via ORAL
  Filled 2015-05-19: qty 2

## 2015-05-19 MED ORDER — FENTANYL CITRATE (PF) 100 MCG/2ML IJ SOLN
50.0000 ug | Freq: Once | INTRAMUSCULAR | Status: AC
Start: 2015-05-19 — End: 2015-05-19
  Administered 2015-05-19: 50 ug via INTRAVENOUS
  Filled 2015-05-19: qty 2

## 2015-05-19 MED ORDER — MAGNESIUM SULFATE IN D5W 10-5 MG/ML-% IV SOLN
1.0000 g | Freq: Once | INTRAVENOUS | Status: AC
  Administered 2015-05-19: 1 g via INTRAVENOUS
  Filled 2015-05-19: qty 100

## 2015-05-19 MED ORDER — ONDANSETRON HCL 4 MG/2ML IJ SOLN
4.0000 mg | Freq: Once | INTRAMUSCULAR | Status: AC
  Administered 2015-05-19: 4 mg via INTRAVENOUS
  Filled 2015-05-19: qty 2

## 2015-05-19 MED ORDER — POTASSIUM CHLORIDE 10 MEQ/100ML IV SOLN
10.0000 meq | INTRAVENOUS | Status: AC
  Administered 2015-05-19 – 2015-05-20 (×3): 10 meq via INTRAVENOUS
  Filled 2015-05-19 (×4): qty 100

## 2015-05-19 MED ORDER — FENTANYL CITRATE (PF) 100 MCG/2ML IJ SOLN
50.0000 ug | Freq: Once | INTRAMUSCULAR | Status: AC
  Administered 2015-05-19: 50 ug via INTRAVENOUS
  Filled 2015-05-19: qty 2

## 2015-05-19 NOTE — ED Notes (Signed)
Pt from home via [facility] was trying to get onto toilet from her walker, misjudged the space, and hit face and L upper ribs on ceramic tub this am.  No loc or neck or back pain.  EMS cbg 299.  Pt began vomiting after the fall.  Given 8 mg zofran IV. No blood thinners per EMS. BP 185/93, hr 85.

## 2015-05-19 NOTE — ED Notes (Signed)
Pt taken to imaging

## 2015-05-19 NOTE — ED Provider Notes (Signed)
CSN: [id number]     Arrival date & time 05/19/15  1823 History   First MD Initiated Contact with Patient 05/19/15 1837     No chief complaint on file.    (Consider location/radiation/quality/duration/timing/severity/associated sxs/prior Treatment) The history is provided by the patient and a friend. No language interpreter was used.    Beth Payne is a(n) 74 y.o. female who presents to the ED with c/o fall. The patient is a poor historian. There is no diagnosis of dementia. However, she seems somewhat confused in her answers. Level 5 caveat.  The patient states that she fell off of her stool this morning while she was sitting at home. She fell onto her left face and side. She states that soon after that she began having nausea and vomiting. She is complaining of pain on the left side of her chest. SOB. She denies LOC. It is difficult to assess the timeline of events b.c the patient answers only yes or no.   Past Medical History  Diagnosis Date  . Coronary artery disease   . Depression   . Retinopathy   . Anxiety   . Heart murmur   . TIA (transient ischemic attack)   . Arthritis     "fingers" (12/23/2014)  . Fibromyalgia   . Type I diabetes mellitus (HCC) dx'd 1953    "I'm on a pup" (12/23/2014)  . Chronic lower back pain   . GERD (gastroesophageal reflux disease) dx'd 1995  . Osteopenia dx'd 2007   Past Surgical History  Procedure Laterality Date  . Cataract extraction w/ intraocular lens  implant, bilateral Bilateral 1998  . Tonsillectomy    . Appendectomy    . Bilateral carpal tunnel release Bilateral 2003-2004  . Back surgery    . Anterior cervical decomp/discectomy fusion  05/2003    "C5-6"  . Abdominal hysterectomy  1972  . Dilation and curettage of uterus    . Cesarean section    . Eye surgery Bilateral since 1977    "numerous slaser surgeries for retinopathy"  . Trigger finger release Bilateral 2003  . Cervical disc surgery  2004    "collapsed C-5"  .  Fracture surgery    . Patella fracture surgery Left 04/2007  . Vertebroplasty  01/2008    "compressed lumbar fracture"  . Laparoscopic lysis of adhesions  1975-1987 X 5  . Exploratory laparotomy  4098-1191 X 1    "adhesions"  . Oophorectomy Bilateral 1980's  . Vitrectomy Right 08/1997  . Bunionectomy with hammertoe reconstruction Right 02/1999  . Coronary angioplasty with stent placement  07/1999    "?1"  . Lumbar microdiscectomy  03/2005  . Orif ankle fracture Left 12/25/2014    Procedure: OPEN REDUCTION INTERNAL FIXATION (ORIF) LEFT TRIMALLEOLAR ANKLE FRACTURE;  Surgeon: Tarry Kos, MD;  Location: [facility] OR;  Service: Orthopedics;  Laterality: Left;   Family History  Problem Relation Age of Onset  . Liver disease Sister   . Colon cancer Brother    Social History  Substance Use Topics  . Smoking status: Current Every Day Smoker -- 1.00 packs/day for 27 years    Types: Cigarettes  . Smokeless tobacco: Never Used     Comment: "quit smoking cigarettes in 1985  . Alcohol Use: No   OB History    No data available     Review of Systems  Unable to perform ROS: Other  confused ? Demented?   Allergies  Diclofenac sodium; Doxycycline calcium; Codeine; and Zocor  Home Medications  Prior to Admission medications   Medication Sig Start Date End Date Taking? Authorizing Provider  acetaminophen (TYLENOL) 325 MG tablet Take 2 tablets (650 mg total) by mouth every 6 (six) hours as needed for mild pain (or Fever >/= 101). 12/27/14   Christiane Ha, MD  amLODipine (NORVASC) 5 MG tablet Take 1 tablet (5 mg total) by mouth daily. 01/01/15   Rodolph Bong, MD  ARIPiprazole (ABILIFY) 5 MG tablet Take 1 tablet (5 mg total) by mouth every morning. 04/28/15   Shanker Levora Dredge, MD  aspirin EC 81 MG tablet Take 1 tablet (81 mg total) by mouth daily. 04/28/15   Shanker Levora Dredge, MD  Cholecalciferol (VITAMIN D3) 2000 UNITS TABS Take by mouth daily. For supplement    Historical Provider, MD    donepezil (ARICEPT) 10 MG tablet Take 10 mg by mouth at bedtime.    Historical Provider, MD  feeding supplement, GLUCERNA SHAKE, (GLUCERNA SHAKE) LIQD Take 237 mLs by mouth 3 (three) times daily between meals. 04/28/15   Shanker Levora Dredge, MD  ferrous sulfate 325 (65 FE) MG tablet Take 325 mg by mouth 2 (two) times daily.    Historical Provider, MD  insulin aspart (NOVOLOG) 100 UNIT/ML injection 0-9 Units, Subcutaneous, 3 times daily with meals CBG < 70: implement hypoglycemia protocol CBG 70 - 120: 0 units CBG 121 - 150: 1 unit CBG 151 - 200: 2 units CBG 201 - 250: 3 units CBG 251 - 300: 5 units CBG 301 - 350: 7 units CBG 351 - 400: 9 units CBG > 400: call MD 04/28/15   Maretta Bees, MD  insulin glargine (LANTUS) 100 UNIT/ML injection Inject 0.18 mLs (18 Units total) into the skin at bedtime. 04/28/15   Shanker Levora Dredge, MD  lamoTRIgine (LAMICTAL) 100 MG tablet Take 50 mg by mouth daily.    Historical Provider, MD  LORazepam (ATIVAN) 0.5 MG tablet Take 1 tablet (0.5 mg total) by mouth 3 (three) times daily as needed. Anxiety 04/28/15   Maretta Bees, MD  Multiple Vitamin (MULITIVITAMIN WITH MINERALS) TABS Take 1 tablet by mouth daily.    Historical Provider, MD  pantoprazole (PROTONIX) 40 MG tablet Take 1 tablet (40 mg total) by mouth 2 (two) times daily. 04/28/15   Shanker Levora Dredge, MD  polyethylene glycol (MIRALAX / GLYCOLAX) packet Take 17 g by mouth daily as needed for mild constipation. Patient taking differently: Take 17 g by mouth 2 (two) times daily.  12/27/14   Christiane Ha, MD  prazosin (MINIPRESS) 1 MG capsule Take 1 mg by mouth daily.    Historical Provider, MD  Protein (PROCEL) POWD Take 1 scoop by mouth 2 (two) times daily.    Historical Provider, MD  rosuvastatin (CRESTOR) 5 MG tablet Take 5 mg by mouth daily.    Historical Provider, MD  sennosides-docusate sodium (SENOKOT-S) 8.6-50 MG tablet Take 2 tablets by mouth 2 (two) times daily.    Historical Provider,  MD  sertraline (ZOLOFT) 100 MG tablet Take 150 mg by mouth daily.    Historical Provider, MD   BP 193/80 mmHg  Pulse 94  Temp(Src) 98.1 F (36.7 C)  Resp 29  SpO2 100% Physical Exam  Constitutional: She is cooperative.  Non-toxic appearance. She has a sickly appearance.  HENT:  Head: Normocephalic. Head is without abrasion.    Right Ear: External ear normal.  Left Ear: External ear normal.  Mask facies  Eyes: Conjunctivae and EOM are normal. Pupils are equal,  round, and reactive to light. No scleral icterus.  Neck: Normal range of motion. Neck supple.  Cardiovascular: Normal rate, regular rhythm and normal heart sounds.  Exam reveals no gallop and no friction rub.   No murmur heard. Pulmonary/Chest: Effort normal and breath sounds normal. No respiratory distress. She exhibits tenderness.    Guarded, shallow breathing.  Abdominal: Soft. Bowel sounds are normal. She exhibits no distension and no mass. There is no tenderness. There is no guarding.  Musculoskeletal: She exhibits no tenderness.  Neurological: She is alert.  Coarse tremors with movement.  Skin: Skin is warm and dry.  Nursing note and vitals reviewed.   ED Course  Procedures (including critical care time) Labs Review Labs Reviewed - No data to display  Imaging Review No results found. I have personally reviewed and evaluated these images and lab results as part of my medical decision-making.   EKG Interpretation None      MDM   Final diagnoses:  Fall  Hypokalemia  Weakness  Falls frequently  Unable to ambulate  Occasional tremors    Patient here with fall and bruising. Her imaging shows 2 fractured ribs of the left side. No abnormalities on CT except an  Incidental node in the lung concerning for bronchogenic carcinoma. Her labs show Hypokalemia.  The patients friend has arrived. She asked me to step outside of the room and then states that the patient has been extremely weak. She is unable to  walk at home. She, neighbor and a female friend of the patient's have been trying to take care of her. Every time someone arrives at the house. The patient was found lying on the floor for unknown amount of time. Today she was found lying in the bathroom where she had fallen on the angle of her to. It is unknown how long she was on the floor there. Her friend states that she has intermittent periods of tremulousness. She is very concerned for her welfare at home as she is unable to care for herself any longer and unable to walk.  I have concern that the patient may have undiagnosed Parkinson's disease, as she has masked facies and tremulousness with movement. Will be admitted for hypokalemia, weakness, but Dr. Toniann FailKakrakandy. The patient agrees with plan of care. She appears safe for admission at this time    Arthor Captainbigail Dan Dissinger, PA-C 05/20/15 1700  Laurence Spatesachel Morgan Little, MD 05/21/15 1246

## 2015-05-20 ENCOUNTER — Encounter (HOSPITAL_COMMUNITY): Payer: Self-pay | Admitting: Internal Medicine

## 2015-05-20 ENCOUNTER — Inpatient Hospital Stay (HOSPITAL_COMMUNITY): Payer: Medicare Other

## 2015-05-20 DIAGNOSIS — E1039 Type 1 diabetes mellitus with other diabetic ophthalmic complication: Secondary | ICD-10-CM | POA: Diagnosis not present

## 2015-05-20 DIAGNOSIS — E784 Other hyperlipidemia: Secondary | ICD-10-CM | POA: Diagnosis not present

## 2015-05-20 DIAGNOSIS — F0390 Unspecified dementia without behavioral disturbance: Secondary | ICD-10-CM | POA: Diagnosis present

## 2015-05-20 DIAGNOSIS — M6282 Rhabdomyolysis: Secondary | ICD-10-CM | POA: Diagnosis not present

## 2015-05-20 DIAGNOSIS — E440 Moderate protein-calorie malnutrition: Secondary | ICD-10-CM | POA: Diagnosis not present

## 2015-05-20 DIAGNOSIS — D649 Anemia, unspecified: Secondary | ICD-10-CM | POA: Diagnosis present

## 2015-05-20 DIAGNOSIS — R259 Unspecified abnormal involuntary movements: Secondary | ICD-10-CM | POA: Diagnosis not present

## 2015-05-20 DIAGNOSIS — R918 Other nonspecific abnormal finding of lung field: Secondary | ICD-10-CM | POA: Diagnosis present

## 2015-05-20 DIAGNOSIS — M6281 Muscle weakness (generalized): Secondary | ICD-10-CM | POA: Diagnosis not present

## 2015-05-20 DIAGNOSIS — W19XXXA Unspecified fall, initial encounter: Secondary | ICD-10-CM | POA: Diagnosis not present

## 2015-05-20 DIAGNOSIS — F17210 Nicotine dependence, cigarettes, uncomplicated: Secondary | ICD-10-CM | POA: Diagnosis present

## 2015-05-20 DIAGNOSIS — Z681 Body mass index (BMI) 19 or less, adult: Secondary | ICD-10-CM | POA: Diagnosis not present

## 2015-05-20 DIAGNOSIS — Z794 Long term (current) use of insulin: Secondary | ICD-10-CM | POA: Diagnosis not present

## 2015-05-20 DIAGNOSIS — R0902 Hypoxemia: Secondary | ICD-10-CM | POA: Diagnosis not present

## 2015-05-20 DIAGNOSIS — E876 Hypokalemia: Secondary | ICD-10-CM | POA: Diagnosis not present

## 2015-05-20 DIAGNOSIS — E10319 Type 1 diabetes mellitus with unspecified diabetic retinopathy without macular edema: Secondary | ICD-10-CM | POA: Diagnosis present

## 2015-05-20 DIAGNOSIS — S2242XS Multiple fractures of ribs, left side, sequela: Secondary | ICD-10-CM | POA: Diagnosis not present

## 2015-05-20 DIAGNOSIS — D72829 Elevated white blood cell count, unspecified: Secondary | ICD-10-CM | POA: Diagnosis not present

## 2015-05-20 DIAGNOSIS — S0990XA Unspecified injury of head, initial encounter: Secondary | ICD-10-CM | POA: Diagnosis not present

## 2015-05-20 DIAGNOSIS — S2242XA Multiple fractures of ribs, left side, initial encounter for closed fracture: Secondary | ICD-10-CM | POA: Diagnosis present

## 2015-05-20 DIAGNOSIS — R296 Repeated falls: Secondary | ICD-10-CM | POA: Diagnosis not present

## 2015-05-20 DIAGNOSIS — R531 Weakness: Secondary | ICD-10-CM

## 2015-05-20 DIAGNOSIS — D638 Anemia in other chronic diseases classified elsewhere: Secondary | ICD-10-CM | POA: Diagnosis not present

## 2015-05-20 DIAGNOSIS — I10 Essential (primary) hypertension: Secondary | ICD-10-CM | POA: Diagnosis present

## 2015-05-20 DIAGNOSIS — E108 Type 1 diabetes mellitus with unspecified complications: Secondary | ICD-10-CM

## 2015-05-20 LAB — COMPREHENSIVE METABOLIC PANEL
ALBUMIN: 3.3 g/dL — AB (ref 3.5–5.0)
ALT: 42 U/L (ref 14–54)
ANION GAP: 8 (ref 5–15)
AST: 92 U/L — AB (ref 15–41)
Alkaline Phosphatase: 70 U/L (ref 38–126)
BUN: 7 mg/dL (ref 6–20)
CHLORIDE: 104 mmol/L (ref 101–111)
CO2: 24 mmol/L (ref 22–32)
Calcium: 8.8 mg/dL — ABNORMAL LOW (ref 8.9–10.3)
Creatinine, Ser: 0.76 mg/dL (ref 0.44–1.00)
GFR calc Af Amer: >60 mL/min (ref >60)
GFR calc non Af Amer: >60 mL/min (ref >60)
GLUCOSE: 62 mg/dL — AB (ref 65–99)
POTASSIUM: 4.5 mmol/L (ref 3.5–5.1)
Sodium: 136 mmol/L (ref 135–145)
Total Bilirubin: 0.7 mg/dL (ref 0.3–1.2)
Total Protein: 5.5 g/dL — ABNORMAL LOW (ref 6.5–8.1)

## 2015-05-20 LAB — CBC
HCT: 30.4 % — ABNORMAL LOW (ref 36.0–46.0)
HEMATOCRIT: 29.6 % — AB (ref 36.0–46.0)
HEMOGLOBIN: 9.7 g/dL — AB (ref 12.0–15.0)
Hemoglobin: 9.8 g/dL — ABNORMAL LOW (ref 12.0–15.0)
MCH: 26.8 pg (ref 26.0–34.0)
MCH: 27.3 pg (ref 26.0–34.0)
MCHC: 32.2 g/dL (ref 30.0–36.0)
MCHC: 32.8 g/dL (ref 30.0–36.0)
MCV: 83.3 fL (ref 78.0–100.0)
MCV: 83.4 fL (ref 78.0–100.0)
Platelets: 192 10*3/uL (ref 150–400)
Platelets: 189 10*3/uL (ref 150–400)
RBC: 3.65 MIL/uL — ABNORMAL LOW (ref 3.87–5.11)
RBC: 3.55 MIL/uL — ABNORMAL LOW (ref 3.87–5.11)
RDW: 15.3 % (ref 11.5–15.5)
RDW: 15.6 % — AB (ref 11.5–15.5)
WBC: 9.9 10*3/uL (ref 4.0–10.5)
WBC: 10.8 10*3/uL — AB (ref 4.0–10.5)

## 2015-05-20 LAB — CREATININE, SERUM: Creatinine, Ser: 0.66 mg/dL (ref 0.44–1.00)

## 2015-05-20 LAB — CK: Total CK: 4607 U/L — ABNORMAL HIGH (ref 38–234)

## 2015-05-20 LAB — TROPONIN I
TROPONIN I: 0.11 ng/mL — AB (ref <0.031)
TROPONIN I: 0.09 ng/mL — AB (ref <0.031)
Troponin I: 0.07 ng/mL — ABNORMAL HIGH (ref <0.031)

## 2015-05-20 LAB — GLUCOSE, CAPILLARY
GLUCOSE-CAPILLARY: 93 mg/dL (ref 65–99)
GLUCOSE-CAPILLARY: 90 mg/dL (ref 65–99)
Glucose-Capillary: 132 mg/dL — ABNORMAL HIGH (ref 65–99)
Glucose-Capillary: 198 mg/dL — ABNORMAL HIGH (ref 65–99)
Glucose-Capillary: 210 mg/dL — ABNORMAL HIGH (ref 65–99)
Glucose-Capillary: 46 mg/dL — ABNORMAL LOW (ref 65–99)
Glucose-Capillary: 86 mg/dL (ref 65–99)

## 2015-05-20 MED ORDER — ROSUVASTATIN CALCIUM 5 MG PO TABS: 5.0000 mg | ORAL_TABLET | Freq: Every day | ORAL | Status: DC

## 2015-05-20 MED ORDER — ADULT MULTIVITAMIN W/MINERALS CH
1.0000 | ORAL_TABLET | Freq: Every day | ORAL | Status: DC
  Administered 2015-05-20 – 2015-05-22 (×3): 1 via ORAL
  Filled 2015-05-20 (×3): qty 1

## 2015-05-20 MED ORDER — GADOBENATE DIMEGLUMINE 529 MG/ML IV SOLN
10.0000 mL | Freq: Once | INTRAVENOUS | Status: AC
  Administered 2015-05-20: 10 mL via INTRAVENOUS

## 2015-05-20 MED ORDER — INSULIN GLARGINE 100 UNIT/ML ~~LOC~~ SOLN
12.0000 [IU] | Freq: Every day | SUBCUTANEOUS | Status: DC
  Administered 2015-05-20: 12 [IU] via SUBCUTANEOUS
  Filled 2015-05-20 (×2): qty 0.12

## 2015-05-20 MED ORDER — LAMOTRIGINE 25 MG PO TABS
50.0000 mg | ORAL_TABLET | Freq: Every day | ORAL | Status: DC
  Administered 2015-05-20 – 2015-05-21 (×3): 50 mg via ORAL
  Filled 2015-05-20 (×3): qty 2

## 2015-05-20 MED ORDER — FERROUS SULFATE 325 (65 FE) MG PO TABS
325.0000 mg | ORAL_TABLET | Freq: Two times a day (BID) | ORAL | Status: DC
Start: 2015-05-20 — End: 2015-05-22
  Administered 2015-05-20 – 2015-05-22 (×6): 325 mg via ORAL
  Filled 2015-05-20 (×6): qty 1

## 2015-05-20 MED ORDER — INSULIN GLARGINE 100 UNIT/ML ~~LOC~~ SOLN
10.0000 [IU] | Freq: Every day | SUBCUTANEOUS | Status: DC
  Administered 2015-05-21: 10 [IU] via SUBCUTANEOUS
  Filled 2015-05-20 (×3): qty 0.1

## 2015-05-20 MED ORDER — ARIPIPRAZOLE 5 MG PO TABS
5.0000 mg | ORAL_TABLET | Freq: Every morning | ORAL | Status: DC
  Administered 2015-05-20 – 2015-05-22 (×3): 5 mg via ORAL
  Filled 2015-05-20 (×3): qty 1

## 2015-05-20 MED ORDER — ENSURE ENLIVE PO LIQD
237.0000 mL | Freq: Two times a day (BID) | ORAL | Status: DC
  Administered 2015-05-20 – 2015-05-22 (×2): 237 mL via ORAL

## 2015-05-20 MED ORDER — ASPIRIN 325 MG PO TABS
325.0000 mg | ORAL_TABLET | Freq: Every day | ORAL | Status: DC
  Administered 2015-05-20 – 2015-05-22 (×3): 325 mg via ORAL
  Filled 2015-05-20 (×3): qty 1

## 2015-05-20 MED ORDER — FENTANYL CITRATE (PF) 100 MCG/2ML IJ SOLN
25.0000 ug | INTRAMUSCULAR | Status: DC | PRN
  Administered 2015-05-20 – 2015-05-22 (×10): 25 ug via INTRAVENOUS
  Filled 2015-05-20 (×10): qty 2

## 2015-05-20 MED ORDER — PANTOPRAZOLE SODIUM 40 MG PO TBEC
40.0000 mg | DELAYED_RELEASE_TABLET | Freq: Two times a day (BID) | ORAL | Status: DC
  Administered 2015-05-20 – 2015-05-22 (×5): 40 mg via ORAL
  Filled 2015-05-20 (×5): qty 1

## 2015-05-20 MED ORDER — TETANUS-DIPHTH-ACELL PERTUSSIS 5-2.5-18.5 LF-MCG/0.5 IM SUSP
0.5000 mL | Freq: Once | INTRAMUSCULAR | Status: AC
Start: 2015-05-20 — End: 2015-05-20
  Administered 2015-05-20: 0.5 mL via INTRAMUSCULAR
  Filled 2015-05-20: qty 0.5

## 2015-05-20 MED ORDER — IOHEXOL 350 MG/ML SOLN
100.0000 mL | Freq: Once | INTRAVENOUS | Status: AC | PRN
  Administered 2015-05-20: 50 mL via INTRAVENOUS

## 2015-05-20 MED ORDER — PRAZOSIN HCL 1 MG PO CAPS
1.0000 mg | ORAL_CAPSULE | Freq: Every day | ORAL | Status: DC
  Administered 2015-05-20 – 2015-05-21 (×3): 1 mg via ORAL
  Filled 2015-05-20 (×4): qty 1

## 2015-05-20 MED ORDER — SERTRALINE HCL 50 MG PO TABS
150.0000 mg | ORAL_TABLET | Freq: Every day | ORAL | Status: DC
  Administered 2015-05-20 – 2015-05-22 (×3): 150 mg via ORAL
  Filled 2015-05-20 (×3): qty 1

## 2015-05-20 MED ORDER — POLYETHYLENE GLYCOL 3350 17 G PO PACK
17.0000 g | PACK | Freq: Every day | ORAL | Status: DC
  Administered 2015-05-21: 17 g via ORAL
  Filled 2015-05-20 (×3): qty 1

## 2015-05-20 MED ORDER — SODIUM CHLORIDE 0.9 % IV SOLN
INTRAVENOUS | Status: AC
  Administered 2015-05-20 (×3): via INTRAVENOUS

## 2015-05-20 MED ORDER — INSULIN ASPART 100 UNIT/ML ~~LOC~~ SOLN
0.0000 [IU] | Freq: Three times a day (TID) | SUBCUTANEOUS | Status: DC
  Administered 2015-05-20: 2 [IU] via SUBCUTANEOUS
  Administered 2015-05-21: 3 [IU] via SUBCUTANEOUS
  Administered 2015-05-21 – 2015-05-22 (×2): 2 [IU] via SUBCUTANEOUS
  Administered 2015-05-22: 5 [IU] via SUBCUTANEOUS

## 2015-05-20 MED ORDER — SENNOSIDES-DOCUSATE SODIUM 8.6-50 MG PO TABS
2.0000 | ORAL_TABLET | Freq: Two times a day (BID) | ORAL | Status: DC
  Administered 2015-05-20 – 2015-05-22 (×4): 2 via ORAL
  Filled 2015-05-20 (×4): qty 2

## 2015-05-20 MED ORDER — ENOXAPARIN SODIUM 30 MG/0.3ML ~~LOC~~ SOLN
20.0000 mg | SUBCUTANEOUS | Status: DC
  Administered 2015-05-20 – 2015-05-22 (×3): 20 mg via SUBCUTANEOUS
  Filled 2015-05-20: qty 0.2
  Filled 2015-05-20 (×2): qty 0.3
  Filled 2015-05-20 (×2): qty 0.2
  Filled 2015-05-20: qty 0.3
  Filled 2015-05-20: qty 0.2

## 2015-05-20 MED ORDER — DONEPEZIL HCL 10 MG PO TABS
10.0000 mg | ORAL_TABLET | Freq: Every day | ORAL | Status: DC
  Administered 2015-05-20 – 2015-05-21 (×3): 10 mg via ORAL
  Filled 2015-05-20 (×3): qty 1

## 2015-05-20 MED ORDER — VITAMIN D 1000 UNITS PO TABS
2000.0000 [IU] | ORAL_TABLET | Freq: Every day | ORAL | Status: DC
  Administered 2015-05-20 – 2015-05-22 (×3): 2000 [IU] via ORAL
  Filled 2015-05-20 (×4): qty 2

## 2015-05-20 MED ORDER — POTASSIUM CHLORIDE CRYS ER 20 MEQ PO TBCR
20.0000 meq | EXTENDED_RELEASE_TABLET | Freq: Once | ORAL | Status: AC
  Administered 2015-05-20: 20 meq via ORAL
  Filled 2015-05-20: qty 1

## 2015-05-20 NOTE — Progress Notes (Signed)
Inpatient Diabetes Program Recommendations  AACE/ADA: New Consensus Statement on Inpatient Glycemic Control (2015)  Target Ranges:  Prepandial:   less than 140 mg/dL      Peak postprandial:   less than 180 mg/dL (1-2 hours)      Critically ill patients:  140 - 180 mg/dL   Review of Glycemic Control  Diabetes history: DM 1 Outpatient Diabetes medications: Lantus 12, Novolog 0-9 units TID with meals Current orders for Inpatient glycemic control: Lantus 12 units  Inpatient Diabetes Program Recommendations: Correction (SSI): Patient has DM 1. Only basal insulin was ordered. Please consider adding Novolog Sensitive correction TID. She is on the same at home.   Thanks,  Christena DeemShannon Jaisean Monteforte RN, MSN, [facility] Inpatient Diabetes Coordinator Team Pager [phone] (8a-5p)

## 2015-05-20 NOTE — Evaluation (Signed)
Physical Therapy Evaluation Patient Details Name: Beth Payne MRN: [id number] DOB: 12-08-1940 Today's Date: 05/20/2015   History of Present Illness  Pt adm after fall at home. Pt with lt rib fx's and rhabdomyolosis. PMH- DM, DKA, CAD, HTN  Clinical Impression  Pt admitted with above diagnosis and presents to PT with functional limitations due to deficits listed below (See PT problem list). Pt needs skilled PT to maximize independence and safety to allow discharge to ST-SNF prior to return home.      Follow Up Recommendations SNF    Equipment Recommendations  None recommended by PT    Recommendations for Other Services       Precautions / Restrictions Precautions Precautions: Fall      Mobility  Bed Mobility               General bed mobility comments: Pt up in chair.  Transfers Overall transfer level: Needs assistance Equipment used: 4-wheeled walker Transfers: Sit to/from Stand Sit to Stand: Min assist         General transfer comment: Assist to bring hips up and for balance  Ambulation/Gait Ambulation/Gait assistance: Min assist Ambulation Distance (Feet): 120 Feet Assistive device: 4-wheeled walker Gait Pattern/deviations: Step-through pattern;Decreased step length - right;Decreased step length - left;Shuffle;Trunk flexed Gait velocity: decr Gait velocity interpretation: Below normal speed for age/gender General Gait Details: Verbal cues to stay closer to walker. Assist for balance.  Stairs            Wheelchair Mobility    Modified Rankin (Stroke Patients Only)       Balance Overall balance assessment: Needs assistance Sitting-balance support: No upper extremity supported;Feet supported Sitting balance-Leahy Scale: Fair     Standing balance support: Bilateral upper extremity supported Standing balance-Leahy Scale: Poor Standing balance comment: support of walker                             Pertinent Vitals/Pain  Pain Assessment: No/denies pain    Home Living Family/patient expects to be discharged to:: Private residence Living Arrangements: Alone Available Help at Discharge: Family;Personal care attendant;Available PRN/intermittently Type of Home: House Home Access: Stairs to enter Entrance Stairs-Rails: Left;Right;Can reach both Entrance Stairs-Number of Steps: 6 Home Layout: One level Home Equipment: Shower seat;Bedside commode;Walker - 2 wheels;Walker - 4 wheels Additional Comments: Info from prior admission    Prior Function Level of Independence: Needs assistance   Gait / Transfers Assistance Needed: Amb modified independent with rollator     Comments: Pt had just returned home after SNF rehab 1 week prior to admission.     Hand Dominance   Dominant Hand: Right    Extremity/Trunk Assessment   Upper Extremity Assessment: Generalized weakness           Lower Extremity Assessment: Generalized weakness         Communication   Communication: No difficulties  Cognition Arousal/Alertness: Awake/alert Behavior During Therapy: WFL for tasks assessed/performed Overall Cognitive Status: No family/caregiver present to determine baseline cognitive functioning                      General Comments      Exercises        Assessment/Plan    PT Assessment Patient needs continued PT services  PT Diagnosis Difficulty walking;Generalized weakness   PT Problem List Decreased strength;Decreased activity tolerance;Decreased balance;Decreased mobility;Decreased knowledge of use of DME  PT Treatment Interventions DME instruction;Gait training;Functional  mobility training;Therapeutic activities;Therapeutic exercise;Balance training;Patient/family education   PT Goals (Current goals can be found in the Care Plan section) Acute Rehab PT Goals Patient Stated Goal:  (Did not state) PT Goal Formulation: With patient Time For Goal Achievement: 06/03/15 Potential to Achieve  Goals: Good    Frequency Min 3X/week   Barriers to discharge        Co-evaluation               End of Session Equipment Utilized During Treatment: Gait belt Activity Tolerance: Patient tolerated treatment well Patient left: in chair;with call bell/phone within reach;with chair alarm set Nurse Communication: Mobility status         Time: [id number] PT Time Calculation (min) (ACUTE ONLY): 26 min   Charges:   PT Evaluation $Initial PT Evaluation Tier I: 1 Procedure PT Treatments $Gait Training: 8-22 mins   PT G Codes:        Jamila Slatten 05/20/2015, 3:06 PM [employer] Brittanny Levenhagen PT [phone]

## 2015-05-20 NOTE — Progress Notes (Signed)
Initial Nutrition Assessment  DOCUMENTATION CODES:   Underweight  INTERVENTION:   -Continue Ensure Enlive po BID, each supplement provides 350 kcal and 20 grams of protein  NUTRITION DIAGNOSIS:   Inadequate oral intake related to poor appetite as evidenced by meal completion < 50%.  GOAL:   Patient will meet greater than or equal to 90% of their needs  MONITOR:   PO intake, Supplement acceptance, Labs, Weight trends, Skin, I & O's  REASON FOR ASSESSMENT:   Malnutrition Screening Tool    ASSESSMENT:   Beth Payne is a 74 y.o. female with history of diabetes mellitus type 1, hypertension, chronic anemia and dementia who was recently admitted last month for DKA was brought to the ER the patient was found to have difficulty walking after a fall. Patient states she was trying to sit on that, when she missed and fell onto the floor. Patient had bruised her left side of the face and chest. Patient was not able to get up from the fall in position. Later patient's friend found her on the floor and was brought to the ER. Patient was found to have difficulty walking even a few steps. CT of the head and C-spine and maxillofacial did not show anything acute except for soft tissue contusion. X-rays do reveal left-sided rib fractures. Patient also is mildly hypoxic. CAT scan of the neck did show some spiculated nodule in the left lung concerning for bronchogenic carcinoma and will need further study. CK levels were elevated. Patient's potassium was low and patient has been admitted for further management of weakness with rib fractures and rhabdomyolysis.   Pt admitted with generalized weakness and lt sided rib fx.   Pt sleeping soundly in recliner at time of visit, with multiple blankets covering pt.  Unable to complete Nutrition-Focused physical exam at this time.   Reviewed wt hx, which is difficult to assess due to weight fluctuations. Wt has ranged from 95-116# over the past 6 months.    Observed lunch tray at bedside with minimal intake. Pt consumed less than 50% of meal. Per MAR, pt accepting Ensure supplements well. RD will continue with order.  RD does suspect some degree of malnutrition, however, unable to identify at this time.   Therapy working with pt; recommending SNF placement. Per RNCM, plan is to d/c either to home or SNF (pt has been resistant to SNF placement in the past).   Labs reviewed: CBGS: 46-210.   Diet Order:  Diet Carb Modified Fluid consistency:: Thin; Room service appropriate?: Yes  Skin:  Reviewed, no issues  Last BM:  PTA  Height:   Ht Readings from Last 1 Encounters:  05/20/15 5\' 2"  (1.575 m)    Weight:   Wt Readings from Last 1 Encounters:  05/20/15 97 lb 14.2 oz (44.4 kg)    Ideal Body Weight:  50 kg  BMI:  Body mass index is 17.9 kg/(m^2).  Estimated Nutritional Needs:   Kcal:  1200-1400  Protein:  45-60 grams  Fluid:  1.2-1.4 l  EDUCATION NEEDS:   Education needs no appropriate at this time  Beth Payne, RD, LDN, CDE Pager: [phone] After hours Pager: [phone]

## 2015-05-20 NOTE — Evaluation (Signed)
Occupational Therapy Evaluation Patient Details Name: Beth Payne MRN: [id number] DOB: 1941-02-11 Today's Date: 05/20/2015    History of Present Illness Pt admitted after fall at home. Pt with lt rib fx's and rhabdomyolosis. PMH- DM, DKA, CAD, HTN   Clinical Impression   Pt admitted with above. Pt reports she was getting assist with IADLs and getting in the shower, PTA. Feel pt will benefit from acute OT to increase independence prior to d/c. Recommending SNF at this time, but pt does not seem agreeable. If pt continues to refuse, recommend HHOT.     Follow Up Recommendations  SNF;Supervision/Assistance - 24 hour    Equipment Recommendations  None recommended by OT (if information from previous admission is still accurate)    Recommendations for Other Services       Precautions / Restrictions Precautions Precautions: Fall Restrictions Weight Bearing Restrictions: No      Mobility Bed Mobility Overal bed mobility: Needs Assistance Bed Mobility: Supine to Sit;Sit to Supine     Supine to sit: Supervision Sit to supine: Min assist (to help position in bed; also helped scoot HOB)   General bed mobility comments: assist to adjust in bed as well as assist to scoot HOB (trendelenburg position also used)  Transfers Overall transfer level: Needs assistance Equipment used: Rolling walker (2 wheeled) Transfers: Sit to/from Stand Sit to Stand: Min guard for sit to stand         General transfer comment: Assist to help get pt moving once standing.    Balance Assist for balance during ambulation.                            ADL Overall ADL's : Needs assistance/impaired                     Lower Body Dressing: Sit to/from stand;Maximal assistance   Toilet Transfer: Moderate assistance;Ambulation;RW (sit to stand from chair and bed)           Functional mobility during ADLs: Moderate assistance;Rolling walker       Vision     Perception      Praxis      Pertinent Vitals/Pain Pain Assessment: 0-10 Pain Score: 10-Worst pain ever Pain Location: left side Pain Intervention(s): Monitored during session;Repositioned     Hand Dominance Right   Extremity/Trunk Assessment Upper Extremity Assessment Upper Extremity Assessment: LUE deficits/detail LUE Deficits / Details: pain in left side with left shoulder flexion   Lower Extremity Assessment Lower Extremity Assessment: Defer to PT evaluation       Communication Communication Communication: No difficulties   Cognition Arousal/Alertness: Awake/alert Behavior During Therapy: WFL for tasks assessed/performed Overall Cognitive Status: No family/caregiver present to determine baseline cognitive functioning                     General Comments       Exercises       Shoulder Instructions      Home Living Family/patient expects to be discharged to:: Private residence Living Arrangements: Alone Available Help at Discharge: Family;Personal care attendant;Available PRN/intermittently Type of Home: House Home Access: Stairs to enter [employer] of Steps: 6 Entrance Stairs-Rails: Left;Right;Can reach both Home Layout: One level         Bathroom Toilet: Standard Bathroom Accessibility: Yes   Home Equipment: Shower seat;Bedside commode;Walker - 2 wheels;Walker - 4 wheels   Additional Comments: Info from prior admission  Prior Functioning/Environment Level of Independence: Needs assistance  Gait / Transfers Assistance Needed: Ambulated modified independent with rollator ADL's / Homemaking Assistance Needed: pt reports she gets assist for getting in shower as well as cooking and cleaning   Comments: Pt had just returned home after SNF rehab 1 week prior to admission.    OT Diagnosis: Generalized weakness;Acute pain   OT Problem List: Decreased strength;Impaired balance (sitting and/or standing);Decreased activity tolerance;Decreased  knowledge of use of DME or AE;Decreased knowledge of precautions;Pain;Decreased safety awareness;Decreased range of motion   OT Treatment/Interventions: Patient/family education;Self-care/ADL training;DME and/or AE instruction;Therapeutic activities;Balance training;Cognitive remediation/compensation    OT Goals(Current goals can be found in the care plan section) Acute Rehab OT Goals Patient Stated Goal: not stated OT Goal Formulation: With patient Time For Goal Achievement: 05/27/15 Potential to Achieve Goals: Good ADL Goals Pt Will Perform Grooming: with min guard assist;standing Pt Will Perform Lower Body Bathing: with min assist;sit to/from stand Pt Will Perform Lower Body Dressing: with min assist;sit to/from stand Pt Will Transfer to Toilet: with min guard assist;ambulating;bedside commode Pt Will Perform Toileting - Clothing Manipulation and hygiene: with min assist;sit to/from stand  OT Frequency: Min 2X/week   Barriers to D/C:            Co-evaluation              End of Session Equipment Utilized During Treatment: Gait belt;Rolling walker  Activity Tolerance: Patient limited by pain Patient left: in bed;with call bell/phone within reach;with bed alarm set   Time: [id number] OT Time Calculation (min): 14 min Charges:  OT General Charges $OT Visit: 1 Procedure OT Evaluation $Initial OT Evaluation Tier I: 1 Procedure G-CodesEarlie Raveling:    Roland Lipke L OTR/L [phone] 05/20/2015, 4:26 PM

## 2015-05-20 NOTE — Care Management Note (Signed)
Case Management Note  Patient Details  Name: Beth Payne MRN: [id number] Date of Birth: 09/23/1940  Subjective/Objective:    Date: 05/20/15 Spoke with patient at the bedside.  Introduced self as Sports coachcase manager and explained role in discharge planning and how to be reached.  Verified patient lives in town, alone, she has an aide that comes in every day from 8 am to 2 pm Mon- Friday, then a friend ([name]) comes in from 2 to 5 or 7 pm Mon- Fri.  Has DME rolling walker, w/chair and a bsc.  Expressed potential need for no other DME.  Verified patient anticipates to go, home alone, vs SNF at time of discharge and will have  part-time supervision by family, friends at this time to best of their knowledge.  NCM informed patient that pt eval rec for her to go to a facility to get rehab before she goes back home, she states she is not sure about that, informed her that social worker will be in to speak with her. Patient denied needing help with their medication.  Patient is driven by her care giver (aide) to MD appointments.  Verified patient has PCP [facility].   Plan: CM will continue to follow for discharge planning and [facility].                 Action/Plan:   Expected Discharge Date:                  Expected Discharge Plan:  Skilled Nursing Facility  In-House Referral:  Clinical Social Work  Discharge planning Services  CM Consult  Post Acute Care Choice:    Choice offered to:     DME Arranged:    DME Agency:     HH Arranged:    HH Agency:     Status of Service:  In process, will continue to follow  Medicare Important Message Given:    Date Medicare IM Given:    Medicare IM give by:    Date Additional Medicare IM Given:    Additional Medicare Important Message give by:     If discussed at Long Length of Stay Meetings, dates discussed:    Additional Comments:  Leone Havenaylor, Amaziah Ghosh [place], RN 05/20/2015, 3:24 PM

## 2015-05-20 NOTE — H&P (Signed)
[facility] History and Physical  JENAVI BEEDLE ZHY:[id number] DOB: May 02, 1941 DOA: 05/19/2015  Referring physician: Ms. Cammy Copa. PCP: [name], MD  Specialists: None.  Chief Complaint: Weakness.  HPI: Beth Payne is a 74 y.o. female with history of diabetes mellitus type 1, hypertension, chronic anemia and dementia who was recently admitted last month for DKA was brought to the ER the patient was found to have difficulty walking after a fall. Patient states she was trying to sit on that, when she missed and fell onto the floor. Patient had bruised her left side of the face and chest. Patient was not able to get up from the fall in position. Later patient's friend found her on the floor and was brought to the ER. Patient was found to have difficulty walking even a few steps. CT of the head and C-spine and maxillofacial did not show anything acute except for soft tissue contusion. X-rays do reveal left-sided rib fractures. Patient also is mildly hypoxic. CAT scan of the neck did show some spiculated nodule in the left lung concerning for bronchogenic carcinoma and will need further study. CK levels were elevated. Patient's potassium was low and patient has been admitted for further management of weakness with rib fractures and rhabdomyolysis.   Review of Systems: As presented in the history of presenting illness, rest negative.  Past Medical History  Diagnosis Date  . Coronary artery disease   . Depression   . Retinopathy   . Anxiety   . Heart murmur   . TIA (transient ischemic attack)   . Arthritis     "fingers" (12/23/2014)  . Fibromyalgia   . Type I diabetes mellitus (HCC) dx'd 1953    "I'm on a pup" (12/23/2014)  . Chronic lower back pain   . GERD (gastroesophageal reflux disease) dx'd 1995  . Osteopenia dx'd 2007  . Dementia    Past Surgical History  Procedure Laterality Date  . Cataract extraction w/ intraocular lens  implant, bilateral Bilateral 1998  .  Tonsillectomy    . Appendectomy    . Bilateral carpal tunnel release Bilateral 2003-2004  . Back surgery    . Anterior cervical decomp/discectomy fusion  05/2003    "C5-6"  . Abdominal hysterectomy  1972  . Dilation and curettage of uterus    . Cesarean section    . Eye surgery Bilateral since 1977    "numerous slaser surgeries for retinopathy"  . Trigger finger release Bilateral 2003  . Cervical disc surgery  2004    "collapsed C-5"  . Fracture surgery    . Patella fracture surgery Left 04/2007  . Vertebroplasty  01/2008    "compressed lumbar fracture"  . Laparoscopic lysis of adhesions  1975-1987 X 5  . Exploratory laparotomy  2952-8413 X 1    "adhesions"  . Oophorectomy Bilateral 1980's  . Vitrectomy Right 08/1997  . Bunionectomy with hammertoe reconstruction Right 02/1999  . Coronary angioplasty with stent placement  07/1999    "?1"  . Lumbar microdiscectomy  03/2005  . Orif ankle fracture Left 12/25/2014    Procedure: OPEN REDUCTION INTERNAL FIXATION (ORIF) LEFT TRIMALLEOLAR ANKLE FRACTURE;  Surgeon: Tarry Kos, MD;  Location: [facility] OR;  Service: Orthopedics;  Laterality: Left;   Social History:  reports that she has been smoking Cigarettes.  She has a 27 pack-year smoking history. She has never used smokeless tobacco. She reports that she does not drink alcohol or use illicit drugs. Where does patient live home. Can patient participate  in ADLs? Not sure.  Allergies  Allergen Reactions  . Diclofenac Sodium Swelling and Other (See Comments)    Blurred vision  . Doxycycline Calcium Swelling and Other (See Comments)    Blurred vision  . Codeine Nausea And Vomiting and Other (See Comments)    Dizziness   . Zocor [Simvastatin] Other (See Comments)    Dizziness, weakness     Family History:  Family History  Problem Relation Age of Onset  . Liver disease Sister   . Colon cancer Brother       Prior to Admission medications   Medication Sig Start Date End Date Taking?  Authorizing Provider  acetaminophen (TYLENOL) 325 MG tablet Take 2 tablets (650 mg total) by mouth every 6 (six) hours as needed for mild pain (or Fever >/= 101). 12/27/14  Yes Christiane Ha, MD  ARIPiprazole (ABILIFY) 5 MG tablet Take 1 tablet (5 mg total) by mouth every morning. 04/28/15  Yes Shanker Levora Dredge, MD  aspirin 325 MG tablet Take 325 mg by mouth daily.   Yes Historical Provider, MD  Cholecalciferol (VITAMIN D3) 2000 UNITS TABS Take 2,000 Units by mouth daily. For supplement   Yes Historical Provider, MD  donepezil (ARICEPT) 10 MG tablet Take 10 mg by mouth at bedtime.   Yes Historical Provider, MD  ferrous sulfate 325 (65 FE) MG tablet Take 325 mg by mouth 2 (two) times daily.   Yes Historical Provider, MD  insulin aspart (NOVOLOG) 100 UNIT/ML injection 0-9 Units, Subcutaneous, 3 times daily with meals CBG < 70: implement hypoglycemia protocol CBG 70 - 120: 0 units CBG 121 - 150: 1 unit CBG 151 - 200: 2 units CBG 201 - 250: 3 units CBG 251 - 300: 5 units CBG 301 - 350: 7 units CBG 351 - 400: 9 units CBG > 400: call MD 04/28/15  Yes Shanker Levora Dredge, MD  insulin glargine (LANTUS) 100 UNIT/ML injection Inject 0.18 mLs (18 Units total) into the skin at bedtime. Patient taking differently: Inject 12 Units into the skin at bedtime.  04/28/15  Yes Shanker Levora Dredge, MD  lamoTRIgine (LAMICTAL) 100 MG tablet Take 50 mg by mouth at bedtime.    Yes Historical Provider, MD  Multiple Vitamin (MULITIVITAMIN WITH MINERALS) TABS Take 1 tablet by mouth daily.   Yes Historical Provider, MD  pantoprazole (PROTONIX) 40 MG tablet Take 1 tablet (40 mg total) by mouth 2 (two) times daily. 04/28/15  Yes Shanker Levora Dredge, MD  polyethylene glycol (MIRALAX / GLYCOLAX) packet Take 17 g by mouth daily as needed for mild constipation. Patient taking differently: Take 17 g by mouth daily.  12/27/14  Yes Christiane Ha, MD  prazosin (MINIPRESS) 1 MG capsule Take 1 mg by mouth at bedtime.    Yes  Historical Provider, MD  rosuvastatin (CRESTOR) 5 MG tablet Take 5 mg by mouth daily.   Yes Historical Provider, MD  sennosides-docusate sodium (SENOKOT-S) 8.6-50 MG tablet Take 2 tablets by mouth 2 (two) times daily.   Yes Historical Provider, MD  sertraline (ZOLOFT) 100 MG tablet Take 150 mg by mouth daily.   Yes Historical Provider, MD  amLODipine (NORVASC) 5 MG tablet Take 1 tablet (5 mg total) by mouth daily. Patient not taking: Reported on 05/19/2015 01/01/15   Rodolph Bong, MD  aspirin EC 81 MG tablet Take 1 tablet (81 mg total) by mouth daily. 04/28/15   Shanker Levora Dredge, MD  feeding supplement, GLUCERNA SHAKE, (GLUCERNA SHAKE) LIQD Take 237 mLs  by mouth 3 (three) times daily between meals. 04/28/15   Shanker Levora Dredge, MD  LORazepam (ATIVAN) 0.5 MG tablet Take 1 tablet (0.5 mg total) by mouth 3 (three) times daily as needed. Anxiety 04/28/15   Maretta Bees, MD    Physical Exam: Filed Vitals:   05/19/15 2300 05/20/15 0005 05/20/15 0009 05/20/15 0012  BP: 165/72  141/53   Pulse: 94  65 79  Temp:   98.2 F (36.8 C)   TempSrc:   Oral   Resp: 29  18   Height:  5\' 2"  (1.575 m)    Weight:  44.4 kg (97 lb 14.2 oz)    SpO2: 99%  90% 100%     General:  Moderately built and poorly nourished.  Eyes: Anicteric no pallor.  ENT: Left facial contusion.  Neck: No neck rigidity. No mass felt.  Cardiovascular: S1 and S2 heard.  Respiratory: No rhonchi or crepitations.  Abdomen: Soft nontender bowel sounds present.  Skin: Has continuation on the left face and left chest. Multiple bruises on the skin in the lower extremities.  Musculoskeletal: Chest wall tenderness on the left side.  Psychiatric: Appears normal.  Neurologic: Alert awake oriented to time place and person. Moves all extremities.  Labs on Admission:  Basic Metabolic Panel:  Recent Labs Lab 05/19/15 1913  NA 139  K 2.8*  CL 103  CO2 24  GLUCOSE 260*  BUN 6  CREATININE 0.74  CALCIUM 10.0    Liver Function Tests: No results for input(s): AST, ALT, ALKPHOS, BILITOT, PROT, ALBUMIN in the last 168 hours. No results for input(s): LIPASE, AMYLASE in the last 168 hours. No results for input(s): AMMONIA in the last 168 hours. CBC:  Recent Labs Lab 05/19/15 1913  WBC 12.4*  NEUTROABS 11.2*  HGB 11.7*  HCT 34.9*  MCV 83.7  PLT 187   Cardiac Enzymes:  Recent Labs Lab 05/19/15 1901  CKTOTAL 7957*    BNP (last 3 results) No results for input(s): BNP in the last 8760 hours.  ProBNP (last 3 results) No results for input(s): PROBNP in the last 8760 hours.  CBG:  Recent Labs Lab 05/19/15 1925  GLUCAP 206*    Radiological Exams on Admission: Dg Ribs Unilateral W/chest Left  05/19/2015  CLINICAL DATA:  Chest wall bruising after a fall. Patient struck face and left upper ribs on a ceramic tub. Vomiting. Shortness of breath due to pain. EXAM: LEFT RIBS AND CHEST - 3+ VIEW COMPARISON:  04/25/2015 FINDINGS: Shallow inspiration. Normal heart size and pulmonary vascularity. Central interstitial pattern to the lungs suggesting chronic bronchitis. Linear scarring in the left lung base. No focal airspace disease or consolidation. No pneumothorax. Calcified and tortuous aorta. Coronary stent. Postoperative changes in the cervical spine. Degenerative changes in the spine and shoulders. Acute and mildly displaced fractures of the left eighth and ninth ribs. IMPRESSION: Chronic bronchitic changes suggested in the lungs. No evidence of active pulmonary disease. No pneumothorax. Acute mildly displaced fractures of the left eighth and ninth ribs anterolaterally. Electronically Signed   By: Burman Nieves M.D.   On: 05/19/2015 20:52   Ct Head Wo Contrast  05/19/2015  CLINICAL DATA:  74 year old female status post fall with redness and swelling around left eye EXAM: CT HEAD WITHOUT CONTRAST CT MAXILLOFACIAL WITHOUT CONTRAST CT CERVICAL SPINE WITHOUT CONTRAST TECHNIQUE: Multidetector CT  imaging of the head, cervical spine, and maxillofacial structures were performed using the standard protocol without intravenous contrast. Multiplanar CT image reconstructions of the cervical  spine and maxillofacial structures were also generated. COMPARISON:  Prior CT scan of the head 12/31/2014 FINDINGS: CT HEAD FINDINGS Negative for acute intracranial hemorrhage, acute infarction, mass, mass effect, hydrocephalus or midline shift. Gray-white differentiation is preserved throughout. Stable cortical and central volume loss with ex vacuo ventriculomegaly. Mild chronic microvascular ischemic white matter disease. No focal scalp contusion or evidence of calvarial fracture. Normally aerated mastoid air cells and visualized paranasal sinuses. Atherosclerotic calcifications present in both cavernous and supraclinoid carotid arteries. CT MAXILLOFACIAL FINDINGS The bilateral globes and orbits are symmetric and unremarkable. Soft tissue swelling is present involving the left face on laterally with extension over the maxillary antrum. Streak artifact from numerous dental amalgams limits evaluation of the dentition and maxilla. No evidence of underlying fracture. Normal aeration of the mastoid air cells and paranasal sinuses. CT CERVICAL SPINE FINDINGS No acute fracture, malalignment or prevertebral soft tissue swelling. Multilevel cervical spondylosis. Surgical changes of anterior cervical discectomy and fusion with interbody graft at C5-C6. Successful bony ankylosis at this level. No significant central stenosis. Subcentimeter left-sided thyroid nodules noted incidentally. No acute soft tissue abnormality. Somewhat spiculated sub solid nodular opacity in the superior segment of the left lower lobe measuring up to 1.3 cm in diameter. IMPRESSION: CT HEAD 1. No acute intracranial abnormality. 2. Stable atrophy, ex vacuo ventriculomegaly and chronic microvascular ischemic white matter disease. CT FACE 1. Soft tissue contusion  involving the left face without evidence of underlying fracture. CT CSPINE 1. No acute fracture or malalignment. 2. Incidentally imaged 1.3 cm somewhat spiculated sub solid nodular opacity in the superior segment of the left lower lobe. This is concerning for an underlying bronchogenic malignancy such as adenocarcinoma. Recommend dedicated (non emergent) CT scan of the chest for complete evaluation. 3. Surgical changes of prior ACDF at C5-C6 without evidence of complication. 4. Mild multilevel cervical spondylosis. 5. Incidentally noted sub cm left thyroid nodules. No dedicated follow-up imaging required. Electronically Signed   By: Malachy MoanHeath  McCullough M.D.   On: 05/19/2015 21:01   Ct Cervical Spine Wo Contrast  05/19/2015  CLINICAL DATA:  74 year old female status post fall with redness and swelling around left eye EXAM: CT HEAD WITHOUT CONTRAST CT MAXILLOFACIAL WITHOUT CONTRAST CT CERVICAL SPINE WITHOUT CONTRAST TECHNIQUE: Multidetector CT imaging of the head, cervical spine, and maxillofacial structures were performed using the standard protocol without intravenous contrast. Multiplanar CT image reconstructions of the cervical spine and maxillofacial structures were also generated. COMPARISON:  Prior CT scan of the head 12/31/2014 FINDINGS: CT HEAD FINDINGS Negative for acute intracranial hemorrhage, acute infarction, mass, mass effect, hydrocephalus or midline shift. Gray-white differentiation is preserved throughout. Stable cortical and central volume loss with ex vacuo ventriculomegaly. Mild chronic microvascular ischemic white matter disease. No focal scalp contusion or evidence of calvarial fracture. Normally aerated mastoid air cells and visualized paranasal sinuses. Atherosclerotic calcifications present in both cavernous and supraclinoid carotid arteries. CT MAXILLOFACIAL FINDINGS The bilateral globes and orbits are symmetric and unremarkable. Soft tissue swelling is present involving the left face on  laterally with extension over the maxillary antrum. Streak artifact from numerous dental amalgams limits evaluation of the dentition and maxilla. No evidence of underlying fracture. Normal aeration of the mastoid air cells and paranasal sinuses. CT CERVICAL SPINE FINDINGS No acute fracture, malalignment or prevertebral soft tissue swelling. Multilevel cervical spondylosis. Surgical changes of anterior cervical discectomy and fusion with interbody graft at C5-C6. Successful bony ankylosis at this level. No significant central stenosis. Subcentimeter left-sided thyroid nodules noted incidentally. No  acute soft tissue abnormality. Somewhat spiculated sub solid nodular opacity in the superior segment of the left lower lobe measuring up to 1.3 cm in diameter. IMPRESSION: CT HEAD 1. No acute intracranial abnormality. 2. Stable atrophy, ex vacuo ventriculomegaly and chronic microvascular ischemic white matter disease. CT FACE 1. Soft tissue contusion involving the left face without evidence of underlying fracture. CT CSPINE 1. No acute fracture or malalignment. 2. Incidentally imaged 1.3 cm somewhat spiculated sub solid nodular opacity in the superior segment of the left lower lobe. This is concerning for an underlying bronchogenic malignancy such as adenocarcinoma. Recommend dedicated (non emergent) CT scan of the chest for complete evaluation. 3. Surgical changes of prior ACDF at C5-C6 without evidence of complication. 4. Mild multilevel cervical spondylosis. 5. Incidentally noted sub cm left thyroid nodules. No dedicated follow-up imaging required. Electronically Signed   By: Malachy Moan M.D.   On: 05/19/2015 21:01   Dg Pelvis Portable  05/19/2015  CLINICAL DATA:  Patient fell walking to the toilet.  Vomiting. EXAM: PORTABLE PELVIS 1-2 VIEWS COMPARISON:  None. FINDINGS: There is no evidence of pelvic fracture or diastasis. No pelvic bone lesions are seen. IMPRESSION: Negative. Electronically Signed   By:  Burman Nieves M.D.   On: 05/19/2015 22:53   Ct Maxillofacial Wo Cm  05/19/2015  CLINICAL DATA:  74 year old female status post fall with redness and swelling around left eye EXAM: CT HEAD WITHOUT CONTRAST CT MAXILLOFACIAL WITHOUT CONTRAST CT CERVICAL SPINE WITHOUT CONTRAST TECHNIQUE: Multidetector CT imaging of the head, cervical spine, and maxillofacial structures were performed using the standard protocol without intravenous contrast. Multiplanar CT image reconstructions of the cervical spine and maxillofacial structures were also generated. COMPARISON:  Prior CT scan of the head 12/31/2014 FINDINGS: CT HEAD FINDINGS Negative for acute intracranial hemorrhage, acute infarction, mass, mass effect, hydrocephalus or midline shift. Gray-white differentiation is preserved throughout. Stable cortical and central volume loss with ex vacuo ventriculomegaly. Mild chronic microvascular ischemic white matter disease. No focal scalp contusion or evidence of calvarial fracture. Normally aerated mastoid air cells and visualized paranasal sinuses. Atherosclerotic calcifications present in both cavernous and supraclinoid carotid arteries. CT MAXILLOFACIAL FINDINGS The bilateral globes and orbits are symmetric and unremarkable. Soft tissue swelling is present involving the left face on laterally with extension over the maxillary antrum. Streak artifact from numerous dental amalgams limits evaluation of the dentition and maxilla. No evidence of underlying fracture. Normal aeration of the mastoid air cells and paranasal sinuses. CT CERVICAL SPINE FINDINGS No acute fracture, malalignment or prevertebral soft tissue swelling. Multilevel cervical spondylosis. Surgical changes of anterior cervical discectomy and fusion with interbody graft at C5-C6. Successful bony ankylosis at this level. No significant central stenosis. Subcentimeter left-sided thyroid nodules noted incidentally. No acute soft tissue abnormality. Somewhat  spiculated sub solid nodular opacity in the superior segment of the left lower lobe measuring up to 1.3 cm in diameter. IMPRESSION: CT HEAD 1. No acute intracranial abnormality. 2. Stable atrophy, ex vacuo ventriculomegaly and chronic microvascular ischemic white matter disease. CT FACE 1. Soft tissue contusion involving the left face without evidence of underlying fracture. CT CSPINE 1. No acute fracture or malalignment. 2. Incidentally imaged 1.3 cm somewhat spiculated sub solid nodular opacity in the superior segment of the left lower lobe. This is concerning for an underlying bronchogenic malignancy such as adenocarcinoma. Recommend dedicated (non emergent) CT scan of the chest for complete evaluation. 3. Surgical changes of prior ACDF at C5-C6 without evidence of complication. 4. Mild multilevel cervical  spondylosis. 5. Incidentally noted sub cm left thyroid nodules. No dedicated follow-up imaging required. Electronically Signed   By: Malachy Moan M.D.   On: 05/19/2015 21:01    EKG: Independently reviewed. Normal sinus rhythm with RBBB.  Assessment/Plan Principal Problem:   Weakness generalized Active Problems:   DM (diabetes mellitus), type 1 with complications (HCC)   Hypokalemia   Rhabdomyolysis   Lung mass   Generalized weakness   1. Generalized weakness with fall and rhabdomyolysis - patient has been placed on gentle hydration. Check troponins and follow CK levels. Get physical therapy consultation may need placement. Since patient has new weakness MRI brain has been pending. Holding off statins due to rhabdomyolysis at this time. 2. Hypokalemia - cause not clear probably from poor oral intake. Replace and recheck. Check magnesium levels. 3. Spiculated lung lesion found in the CAT scan of the neck - dedicated CT chest has been ordered and since patient was hypoxic CT angiogram of the chest was ordered. 4. Rib fractures - pain control at this time. 5. Diabetes mellitus type 1 -  presently on Lantus with sliding scale coverage. 6. Dementia - continue home medications. 7. Leukocytosis - maybe reactionary. CT chest is pending. 8. Chronic anemia - follow CBC. 9. Patient has multiple bruises on the skin - for which I have ordered tetanus booster dose.  I have reviewed patient's old charts and labs. Patient at this time wants to be full code though patient's previous records state patient was a DO NOT RESUSCITATE.   DVT Prophylaxis Lovenox.  Code Status: Full code. Patient was a DO NOT RESUSCITATE previously.  Family Communication: Discussed with patient.  Disposition Plan: Admit to inpatient.    Azia Toutant N. [facility] Pager [phone].  If 7PM-7AM, please contact night-coverage [email or web address] Password [facility] 05/20/2015, 12:44 AM

## 2015-05-20 NOTE — Progress Notes (Signed)
Patient seen and examined 74 year old female with a history of diabetes, hypertension, chronic anemia, dementia, admitted for generalized weakness  MRI of the brain did show any intracranial process  CT chest PE protocol without any evidence of pulmonary embolism, possible nodular infiltrate  in the left upper lung and right lower lung, follow-up CT in 3 months is recommended to exclude persistence.Acute mildly displaced fractures of the left eighth and ninth ribs  Anterolaterally.  Potassium corrected , CK improving , follow trend   PT recs pending

## 2015-05-21 LAB — COMPREHENSIVE METABOLIC PANEL
ALT: 36 U/L (ref 14–54)
AST: 71 U/L — ABNORMAL HIGH (ref 15–41)
Albumin: 2.9 g/dL — ABNORMAL LOW (ref 3.5–5.0)
Alkaline Phosphatase: 64 U/L (ref 38–126)
Anion gap: 6 (ref 5–15)
CHLORIDE: 105 mmol/L (ref 101–111)
CO2: 25 mmol/L (ref 22–32)
Calcium: 8.7 mg/dL — ABNORMAL LOW (ref 8.9–10.3)
Creatinine, Ser: 0.52 mg/dL (ref 0.44–1.00)
Glucose, Bld: 65 mg/dL (ref 65–99)
POTASSIUM: 3.4 mmol/L — AB (ref 3.5–5.1)
SODIUM: 136 mmol/L (ref 135–145)
Total Bilirubin: 0.6 mg/dL (ref 0.3–1.2)
Total Protein: 4.9 g/dL — ABNORMAL LOW (ref 6.5–8.1)

## 2015-05-21 LAB — CBC
HCT: 28.3 % — ABNORMAL LOW (ref 36.0–46.0)
Hemoglobin: 9.1 g/dL — ABNORMAL LOW (ref 12.0–15.0)
MCH: 27.4 pg (ref 26.0–34.0)
MCHC: 32.2 g/dL (ref 30.0–36.0)
MCV: 85.2 fL (ref 78.0–100.0)
PLATELETS: 174 10*3/uL (ref 150–400)
RBC: 3.32 MIL/uL — AB (ref 3.87–5.11)
RDW: 15.8 % — AB (ref 11.5–15.5)
WBC: 8.1 10*3/uL (ref 4.0–10.5)

## 2015-05-21 LAB — GLUCOSE, CAPILLARY
GLUCOSE-CAPILLARY: 67 mg/dL (ref 65–99)
GLUCOSE-CAPILLARY: 151 mg/dL — AB (ref 65–99)
GLUCOSE-CAPILLARY: 110 mg/dL — AB (ref 65–99)
GLUCOSE-CAPILLARY: 231 mg/dL — AB (ref 65–99)
Glucose-Capillary: 201 mg/dL — ABNORMAL HIGH (ref 65–99)

## 2015-05-21 LAB — CK: CK TOTAL: 2544 U/L — AB (ref 38–234)

## 2015-05-21 LAB — HEMOGLOBIN A1C
HEMOGLOBIN A1C: 8.2 % — AB (ref 4.8–5.6)
Mean Plasma Glucose: 189 mg/dL

## 2015-05-21 MED ORDER — POTASSIUM CHLORIDE IN NACL 20-0.9 MEQ/L-% IV SOLN
INTRAVENOUS | Status: DC
  Administered 2015-05-21: 16:00:00 via INTRAVENOUS
  Filled 2015-05-21 (×5): qty 1000

## 2015-05-21 MED ORDER — POTASSIUM CHLORIDE CRYS ER 20 MEQ PO TBCR
40.0000 meq | EXTENDED_RELEASE_TABLET | Freq: Once | ORAL | Status: AC
  Administered 2015-05-21: 40 meq via ORAL
  Filled 2015-05-21: qty 2

## 2015-05-21 MED ORDER — AMLODIPINE BESYLATE 10 MG PO TABS
10.0000 mg | ORAL_TABLET | Freq: Every day | ORAL | Status: DC
  Administered 2015-05-21 – 2015-05-22 (×2): 10 mg via ORAL
  Filled 2015-05-21 (×2): qty 1

## 2015-05-21 NOTE — Progress Notes (Signed)
Hypoglycemic Event  CBG: 67  Treatment: 15 GM carbohydrate snack  Symptoms: None  Follow-up CBG: Time:0921 CBG Result:201  Possible Reasons for Event: Inadequate meal intake  Comments/MD notified:Dr. Susie CassetteAbrol notified via text page    Dietrich PatesBrittany Vianne Grieshop

## 2015-05-21 NOTE — Clinical Social Work Note (Signed)
Clinical Social Work Assessment  Patient Details  Name: Beth Payne MRN: [id number] Date of Birth: 08/11/1940  Date of referral:  05/21/15               Reason for consult:  Facility Placement                Permission sought to share information with:  Chartered certified accountant granted to share information::  Yes, Verbal Permission Granted  Name::     Beth Payne   Agency::  [facility]  Relationship::  Son  Contact Information:  [phone]  Housing/Transportation Living arrangements for the past 2 months:  [place] of Information:  Patient Patient Interpreter Needed:  None Criminal Activity/Legal Involvement Pertinent to Current Situation/Hospitalization:  No - Comment as needed Significant Relationships:  Adult Children Lives with:  Self Do you feel safe going back to the place where you live?  No Need for family participation in patient care:  Yes (Comment)  Care giving concerns:  CSW received referral for possible SNF placement at time of discharge. CSW met with patient regarding PT recommendation of SNF placement at time of discharge. Patient reported being currently unable to care for herself given patient's current physical needs and fall risk. Patient expressed understanding of PT recommendation and is agreeable to SNF placement at time of discharge. CSW to continue to follow and assist with discharge planning needs.   Social Worker assessment / plan:  CSW spoke with patient concerning possibility of rehab at [facility] before returning home.  Employment status:  Retired Forensic scientist:  Medicare PT Recommendations:  [place] / Referral to community resources:  [place]  Patient/Family's Response to care:  Patient recognizes need for rehab before returning home and is agreeable to a SNF in [place]. Patient reported preference for [facility].  Patient/Family's Understanding of  and Emotional Response to Diagnosis, Current Treatment, and Prognosis:  Patient is realistic regarding therapy needs. No questions/concerns about plan or treatment.    Emotional Assessment Appearance:  Appears stated age Attitude/Demeanor/Rapport:   (Appropriate) Affect (typically observed):  Accepting, Appropriate Orientation:  Oriented to Self, Oriented to Place, Oriented to  Time, Oriented to Situation Alcohol / Substance use:  Not Applicable Psych involvement (Current and /or in the community):  No (Comment)  Discharge Needs  Concerns to be addressed:  Care Coordination Readmission within the last 30 days:  Yes Current discharge risk:  None Barriers to Discharge:  Continued Medical Work up   [employer], [place] 05/21/2015, 2:24 PM

## 2015-05-21 NOTE — Progress Notes (Addendum)
[facility] PROGRESS NOTE  Beth Payne ZOX:[id number] DOB: Dec 11, 1940 DOA: 05/19/2015 PCP: Beth Hy, MD  Length of stay: 1   Assessment/Plan: Principal Problem:   Weakness generalized Active Problems:   DM (diabetes mellitus), type 1 with complications (HCC)   Hypokalemia   Rhabdomyolysis   Lung mass   Generalized weakness    HPI: Beth Payne is a 74 y.o. female with history of diabetes mellitus type 1, hypertension, chronic anemia and dementia who was recently admitted last month for DKA was brought to the ER the patient was found to have difficulty walking after a fall. Patient states she was trying to sit on that, when she missed and fell onto the floor. Patient had bruised her left side of the face and chest. Patient was not able to get up from the fall in position. Later patient's friend found her on the floor and was brought to the ER. Patient was found to have difficulty walking even a few steps. CT of the head and C-spine and maxillofacial did not show anything acute except for soft tissue contusion. X-rays do reveal left-sided rib fractures. Patient also is mildly hypoxic. CAT scan of the neck did show some spiculated nodule in the left lung concerning for bronchogenic carcinoma and will need further study. CK levels were elevated. Patient's potassium was low and patient has been admitted for further management of weakness with rib fractures and rhabdomyolysis.  Assessment and plan   1. Generalized weakness with fall and rhabdomyolysis - patient has been placed on gentle hydration. Stable  troponins and follow CK levels.   physical therapy recommends placement. Since patient has new weakness MRI brain shows no acute issues . Holding off statins due to rhabdomyolysis at this time.cont ivf  2. Hypokalemia - cause not clear probably from poor oral intake. Replace and recheck. Check magnesium levels. 3. Spiculated lung lesion found in the CAT scan of the  neck -  CT chest PE protocol without any evidence of pulmonary embolism, possible nodular infiltrate in the left upper lung and right lower lung, follow-up CT in 3 months is recommended to exclude persistence.Acute mildly displaced fractures of the left eighth and ninth ribs Anterolaterally. 4. Rib fractures - pain control at this time.incentive spirometry  5. Diabetes mellitus type 1 - presently on Lantus with sliding scale coverage. 6. Dementia - continue home medications. 7. Leukocytosis - maybe reactionary. CT chest is pending. 8. Chronic anemia -  CBC trending down 9.8>9.1 9. Patient has multiple bruises on the skin - for which I have ordered tetanus booster dose. 10. HTN -uncontrolled , started norvasc , continue  Minipress    DVT prophylaxsis   Code Status:      Code Status Orders        Start     Ordered   05/20/15 0041  Full code   Continuous     05/20/15 0042    Advance Directive Documentation        Most Recent Value   Type of Advance Directive  Healthcare Power of Attorney, Living will   Pre-existing out of facility DNR order (yellow form or pink MOST form)     "MOST" Form in Place?        Family Communication: Discussed in detail with the patient, all imaging results, lab results explained to the patient   Disposition Plan:  snf      Consultants:  None   Procedures:  None   Antibiotics:  Anti-infectives    None         HPI/Subjective: Sleeping comfortable in bed , pain improved   Objective: Filed Vitals:   05/20/15 1337 05/20/15 2141 05/21/15 0640 05/21/15 0751  BP: 153/57 168/68 180/75 164/67  Pulse: 79 81 78 18  Temp: 99.1 F (37.3 C) 98.1 F (36.7 C) 97.3 F (36.3 C)   TempSrc: Oral Oral Oral   Resp: 24 19 18    Height:      Weight:      SpO2: 99% 99% 98% 96%    Intake/Output Summary (Last 24 hours) at 05/21/15 1234 Last data filed at 05/21/15 1610  Gross per 24 hour  Intake    680 ml  Output    300 ml  Net    380 ml     Exam:  General: No acute respiratory distress Lungs: Clear to auscultation bilaterally without wheezes or crackles Cardiovascular: Regular rate and rhythm without murmur gallop or rub normal S1 and S2 Abdomen: Nontender, nondistended, soft, bowel sounds positive, no rebound, no ascites, no appreciable mass Extremities: No significant cyanosis, clubbing, or edema bilateral lower extremities     Data Review   Micro Results No results found for this or any previous visit (from the past 240 hour(s)).  Radiology Reports Dg Ribs Unilateral W/chest Left  05/19/2015  CLINICAL DATA:  Chest wall bruising after a fall. Patient struck face and left upper ribs on a ceramic tub. Vomiting. Shortness of breath due to pain. EXAM: LEFT RIBS AND CHEST - 3+ VIEW COMPARISON:  04/25/2015 FINDINGS: Shallow inspiration. Normal heart size and pulmonary vascularity. Central interstitial pattern to the lungs suggesting chronic bronchitis. Linear scarring in the left lung base. No focal airspace disease or consolidation. No pneumothorax. Calcified and tortuous aorta. Coronary stent. Postoperative changes in the cervical spine. Degenerative changes in the spine and shoulders. Acute and mildly displaced fractures of the left eighth and ninth ribs. IMPRESSION: Chronic bronchitic changes suggested in the lungs. No evidence of active pulmonary disease. No pneumothorax. Acute mildly displaced fractures of the left eighth and ninth ribs anterolaterally. Electronically Signed   By: Burman Nieves M.D.   On: 05/19/2015 20:52   Ct Head Wo Contrast  05/19/2015  CLINICAL DATA:  74 year old female status post fall with redness and swelling around left eye EXAM: CT HEAD WITHOUT CONTRAST CT MAXILLOFACIAL WITHOUT CONTRAST CT CERVICAL SPINE WITHOUT CONTRAST TECHNIQUE: Multidetector CT imaging of the head, cervical spine, and maxillofacial structures were performed using the standard protocol without intravenous contrast.  Multiplanar CT image reconstructions of the cervical spine and maxillofacial structures were also generated. COMPARISON:  Prior CT scan of the head 12/31/2014 FINDINGS: CT HEAD FINDINGS Negative for acute intracranial hemorrhage, acute infarction, mass, mass effect, hydrocephalus or midline shift. Gray-white differentiation is preserved throughout. Stable cortical and central volume loss with ex vacuo ventriculomegaly. Mild chronic microvascular ischemic white matter disease. No focal scalp contusion or evidence of calvarial fracture. Normally aerated mastoid air cells and visualized paranasal sinuses. Atherosclerotic calcifications present in both cavernous and supraclinoid carotid arteries. CT MAXILLOFACIAL FINDINGS The bilateral globes and orbits are symmetric and unremarkable. Soft tissue swelling is present involving the left face on laterally with extension over the maxillary antrum. Streak artifact from numerous dental amalgams limits evaluation of the dentition and maxilla. No evidence of underlying fracture. Normal aeration of the mastoid air cells and paranasal sinuses. CT CERVICAL SPINE FINDINGS No acute fracture, malalignment or prevertebral soft tissue swelling. Multilevel cervical spondylosis. Surgical changes of  anterior cervical discectomy and fusion with interbody graft at C5-C6. Successful bony ankylosis at this level. No significant central stenosis. Subcentimeter left-sided thyroid nodules noted incidentally. No acute soft tissue abnormality. Somewhat spiculated sub solid nodular opacity in the superior segment of the left lower lobe measuring up to 1.3 cm in diameter. IMPRESSION: CT HEAD 1. No acute intracranial abnormality. 2. Stable atrophy, ex vacuo ventriculomegaly and chronic microvascular ischemic white matter disease. CT FACE 1. Soft tissue contusion involving the left face without evidence of underlying fracture. CT CSPINE 1. No acute fracture or malalignment. 2. Incidentally imaged 1.3  cm somewhat spiculated sub solid nodular opacity in the superior segment of the left lower lobe. This is concerning for an underlying bronchogenic malignancy such as adenocarcinoma. Recommend dedicated (non emergent) CT scan of the chest for complete evaluation. 3. Surgical changes of prior ACDF at C5-C6 without evidence of complication. 4. Mild multilevel cervical spondylosis. 5. Incidentally noted sub cm left thyroid nodules. No dedicated follow-up imaging required. Electronically Signed   By: Malachy Moan M.D.   On: 05/19/2015 21:01   Ct Angio Chest Pe W/cm &/or Wo Cm  05/20/2015  CLINICAL DATA:  Hypoxia.  Lung mass EXAM: CT ANGIOGRAPHY CHEST WITH CONTRAST TECHNIQUE: Multidetector CT imaging of the chest was performed using the standard protocol during bolus administration of intravenous contrast. Multiplanar CT image reconstructions and MIPs were obtained to evaluate the vascular anatomy. CONTRAST:  50mL OMNIPAQUE IOHEXOL 350 MG/ML SOLN COMPARISON:  None. FINDINGS: Technically adequate study with good opacification of the central and segmental pulmonary arteries. No focal filling defects demonstrated. No evidence of significant pulmonary embolus. Mild cardiac enlargement. Coronary artery calcifications. Normal caliber thoracic aorta with scattered calcification. Great vessel origins are patent. No aortic dissection. Contrast material is demonstrated in the esophagus suggesting reflux or dysmotility. No esophageal dilatation. No significant lymphadenopathy in the chest. Focal nodular areas of infiltration in the left upper lung and right lower lung. These are likely inflammatory but three-month follow-up is recommended to exclude persistent ground-glass nodule. Atelectasis in the lung bases. No pleural effusions. No pneumothorax. Airways appear patent. Included portions of the upper abdominal organs demonstrate no gross focal abnormality. Degenerative changes in the spine. Postoperative changes in the  cervical spine. Review of the MIP images confirms the above findings. IMPRESSION: No evidence of significant pulmonary embolus. Atelectasis in the lung bases. Focal nodular infiltrates in the left upper lung and right lower lung are likely inflammatory but follow-up CT in 3 months is recommended to exclude persistence. Electronically Signed   By: Burman Nieves M.D.   On: 05/20/2015 02:11   Ct Cervical Spine Wo Contrast  05/19/2015  CLINICAL DATA:  74 year old female status post fall with redness and swelling around left eye EXAM: CT HEAD WITHOUT CONTRAST CT MAXILLOFACIAL WITHOUT CONTRAST CT CERVICAL SPINE WITHOUT CONTRAST TECHNIQUE: Multidetector CT imaging of the head, cervical spine, and maxillofacial structures were performed using the standard protocol without intravenous contrast. Multiplanar CT image reconstructions of the cervical spine and maxillofacial structures were also generated. COMPARISON:  Prior CT scan of the head 12/31/2014 FINDINGS: CT HEAD FINDINGS Negative for acute intracranial hemorrhage, acute infarction, mass, mass effect, hydrocephalus or midline shift. Gray-white differentiation is preserved throughout. Stable cortical and central volume loss with ex vacuo ventriculomegaly. Mild chronic microvascular ischemic white matter disease. No focal scalp contusion or evidence of calvarial fracture. Normally aerated mastoid air cells and visualized paranasal sinuses. Atherosclerotic calcifications present in both cavernous and supraclinoid carotid arteries. CT MAXILLOFACIAL FINDINGS  The bilateral globes and orbits are symmetric and unremarkable. Soft tissue swelling is present involving the left face on laterally with extension over the maxillary antrum. Streak artifact from numerous dental amalgams limits evaluation of the dentition and maxilla. No evidence of underlying fracture. Normal aeration of the mastoid air cells and paranasal sinuses. CT CERVICAL SPINE FINDINGS No acute fracture,  malalignment or prevertebral soft tissue swelling. Multilevel cervical spondylosis. Surgical changes of anterior cervical discectomy and fusion with interbody graft at C5-C6. Successful bony ankylosis at this level. No significant central stenosis. Subcentimeter left-sided thyroid nodules noted incidentally. No acute soft tissue abnormality. Somewhat spiculated sub solid nodular opacity in the superior segment of the left lower lobe measuring up to 1.3 cm in diameter. IMPRESSION: CT HEAD 1. No acute intracranial abnormality. 2. Stable atrophy, ex vacuo ventriculomegaly and chronic microvascular ischemic white matter disease. CT FACE 1. Soft tissue contusion involving the left face without evidence of underlying fracture. CT CSPINE 1. No acute fracture or malalignment. 2. Incidentally imaged 1.3 cm somewhat spiculated sub solid nodular opacity in the superior segment of the left lower lobe. This is concerning for an underlying bronchogenic malignancy such as adenocarcinoma. Recommend dedicated (non emergent) CT scan of the chest for complete evaluation. 3. Surgical changes of prior ACDF at C5-C6 without evidence of complication. 4. Mild multilevel cervical spondylosis. 5. Incidentally noted sub cm left thyroid nodules. No dedicated follow-up imaging required. Electronically Signed   By: Malachy Moan M.D.   On: 05/19/2015 21:01   Mr Laqueta Jean ZO Contrast  05/20/2015  CLINICAL DATA:  Initial evaluation for are falls, weakness. EXAM: MRI HEAD WITHOUT AND WITH CONTRAST TECHNIQUE: Multiplanar, multiecho pulse sequences of the brain and surrounding structures were obtained without and with intravenous contrast. CONTRAST:  10mL MULTIHANCE GADOBENATE DIMEGLUMINE 529 MG/ML IV SOLN COMPARISON:  Prior study from 05/19/2015. FINDINGS: Diffuse prominence of CSF containing spaces is compatible with generalized age-related cerebral atrophy. Patchy T2/FLAIR hyperintensity within the periventricular and deep white matter both  cerebral hemispheres most consistent with chronic small vessel ischemic disease, mild in nature. No abnormal foci of restricted diffusion to suggest acute intracranial infarct. Gray-white matter differentiation maintained. Normal intravascular flow voids are preserved. No acute or chronic intracranial hemorrhage. No mass lesion, midline shift, or mass effect. Ventricular prominence related to global parenchymal volume loss present without hydrocephalus. No extra-axial fluid collection. No abnormal enhancement. Craniocervical junction within normal limits. Pituitary gland normal. No acute abnormality about the orbits. Sequela prior bilateral lens extraction noted. Paranasal sinuses are clear. No significant mastoid effusion. Inner ear structures grossly normal. Bone marrow signal intensity within normal limits. Scalp soft tissues within normal limits. IMPRESSION: 1. No acute intracranial process. 2. Mild age-related cerebral atrophy with chronic small vessel ischemic disease. Electronically Signed   By: Rise Mu M.D.   On: 05/20/2015 05:33   Dg Esophagus  04/28/2015  CLINICAL DATA:  Painful and difficulty swallowing. EXAM: ESOPHOGRAM/BARIUM SWALLOW TECHNIQUE: Single contrast examination was performed using water soluble contrast. FLUOROSCOPY TIME:  Fluoroscopy Time:  1 minute 38 seconds COMPARISON:  None. FINDINGS: The patient ingested water-soluble contrast. There is no evidence of any leak from the esophagus. There were poor primary and secondary stripping waves. There is no obstruction or mass lesion. No visible esophagitis. The patient ingested a 13 mm barium tablet. It passed into the distal esophagus but did not pass into the stomach despite repeated swallows of water. I think this is more likely due to poor esophageal peristalsis and a  true obstruction. The patient may have early achalasia. IMPRESSION: No evidence of esophageal tear. 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 13 mm  barium tablet never passed into the stomach. Electronically Signed   By: Francene Boyers M.D.   On: 04/28/2015 10:20   Dg Pelvis Portable  05/19/2015  CLINICAL DATA:  Patient fell walking to the toilet.  Vomiting. EXAM: PORTABLE PELVIS 1-2 VIEWS COMPARISON:  None. FINDINGS: There is no evidence of pelvic fracture or diastasis. No pelvic bone lesions are seen. IMPRESSION: Negative. Electronically Signed   By: Burman Nieves M.D.   On: 05/19/2015 22:53   Dg Chest Port 1 View  04/25/2015  CLINICAL DATA:  Leukocytosis EXAM: PORTABLE CHEST 1 VIEW COMPARISON:  02/23/2015 FINDINGS: Cardiomediastinal silhouette is stable. No acute infiltrate or pleural effusion. No pulmonary edema. Stable scarring in lingula. Central mild bronchitic changes. Prior vertebroplasty lower thoracic spine again noted. Degenerative changes bilateral shoulders. IMPRESSION: No acute infiltrate or pulmonary edema. Central mild bronchitic changes. Electronically Signed   By: Natasha Mead M.D.   On: 04/25/2015 09:02   Ct Maxillofacial Wo Cm  05/19/2015  CLINICAL DATA:  74 year old female status post fall with redness and swelling around left eye EXAM: CT HEAD WITHOUT CONTRAST CT MAXILLOFACIAL WITHOUT CONTRAST CT CERVICAL SPINE WITHOUT CONTRAST TECHNIQUE: Multidetector CT imaging of the head, cervical spine, and maxillofacial structures were performed using the standard protocol without intravenous contrast. Multiplanar CT image reconstructions of the cervical spine and maxillofacial structures were also generated. COMPARISON:  Prior CT scan of the head 12/31/2014 FINDINGS: CT HEAD FINDINGS Negative for acute intracranial hemorrhage, acute infarction, mass, mass effect, hydrocephalus or midline shift. Gray-white differentiation is preserved throughout. Stable cortical and central volume loss with ex vacuo ventriculomegaly. Mild chronic microvascular ischemic white matter disease. No focal scalp contusion or evidence of calvarial fracture.  Normally aerated mastoid air cells and visualized paranasal sinuses. Atherosclerotic calcifications present in both cavernous and supraclinoid carotid arteries. CT MAXILLOFACIAL FINDINGS The bilateral globes and orbits are symmetric and unremarkable. Soft tissue swelling is present involving the left face on laterally with extension over the maxillary antrum. Streak artifact from numerous dental amalgams limits evaluation of the dentition and maxilla. No evidence of underlying fracture. Normal aeration of the mastoid air cells and paranasal sinuses. CT CERVICAL SPINE FINDINGS No acute fracture, malalignment or prevertebral soft tissue swelling. Multilevel cervical spondylosis. Surgical changes of anterior cervical discectomy and fusion with interbody graft at C5-C6. Successful bony ankylosis at this level. No significant central stenosis. Subcentimeter left-sided thyroid nodules noted incidentally. No acute soft tissue abnormality. Somewhat spiculated sub solid nodular opacity in the superior segment of the left lower lobe measuring up to 1.3 cm in diameter. IMPRESSION: CT HEAD 1. No acute intracranial abnormality. 2. Stable atrophy, ex vacuo ventriculomegaly and chronic microvascular ischemic white matter disease. CT FACE 1. Soft tissue contusion involving the left face without evidence of underlying fracture. CT CSPINE 1. No acute fracture or malalignment. 2. Incidentally imaged 1.3 cm somewhat spiculated sub solid nodular opacity in the superior segment of the left lower lobe. This is concerning for an underlying bronchogenic malignancy such as adenocarcinoma. Recommend dedicated (non emergent) CT scan of the chest for complete evaluation. 3. Surgical changes of prior ACDF at C5-C6 without evidence of complication. 4. Mild multilevel cervical spondylosis. 5. Incidentally noted sub cm left thyroid nodules. No dedicated follow-up imaging required. Electronically Signed   By: Malachy Moan M.D.   On: 05/19/2015  21:01  CBC  Recent Labs Lab 05/19/15 1913 05/20/15 0240 05/20/15 0643 05/21/15 0605  WBC 12.4* 9.9 10.8* 8.1  HGB 11.7* 9.8* 9.7* 9.1*  HCT 34.9* 30.4* 29.6* 28.3*  PLT 187 192 189 174  MCV 83.7 83.3 83.4 85.2  MCH 28.1 26.8 27.3 27.4  MCHC 33.5 32.2 32.8 32.2  RDW 15.2 15.3 15.6* 15.8*  LYMPHSABS 0.5*  --   --   --   MONOABS 0.7  --   --   --   EOSABS 0.0  --   --   --   BASOSABS 0.0  --   --   --     Chemistries   Recent Labs Lab 05/19/15 1913 05/20/15 0240 05/20/15 0643 05/21/15 0605  NA 139  --  136 136  K 2.8*  --  4.5 3.4*  CL 103  --  104 105  CO2 24  --  24 25  GLUCOSE 260*  --  62* 65  BUN 6  --  7 <5*  CREATININE 0.74 0.66 0.76 0.52  CALCIUM 10.0  --  8.8* 8.7*  AST  --   --  92* 71*  ALT  --   --  42 36  ALKPHOS  --   --  70 64  BILITOT  --   --  0.7 0.6   ------------------------------------------------------------------------------------------------------------------ estimated creatinine clearance is 43.9 mL/min (by C-G formula based on Cr of 0.52). ------------------------------------------------------------------------------------------------------------------  Recent Labs  05/20/15 0643  HGBA1C 8.2*   ------------------------------------------------------------------------------------------------------------------ No results for input(s): CHOL, HDL, LDLCALC, TRIG, CHOLHDL, LDLDIRECT in the last 72 hours. ------------------------------------------------------------------------------------------------------------------ No results for input(s): TSH, T4TOTAL, T3FREE, THYROIDAB in the last 72 hours.  Invalid input(s): FREET3 ------------------------------------------------------------------------------------------------------------------ No results for input(s): VITAMINB12, FOLATE, FERRITIN, TIBC, IRON, RETICCTPCT in the last 72 hours.  Coagulation profile No results for input(s): INR, PROTIME in the last 168 hours.  No results for  input(s): DDIMER in the last 72 hours.  Cardiac Enzymes  Recent Labs Lab 05/20/15 0240 05/20/15 0643 05/20/15 1326  TROPONINI 0.11* 0.09* 0.07*   ------------------------------------------------------------------------------------------------------------------ Invalid input(s): POCBNP   CBG:  Recent Labs Lab 05/20/15 2140 05/20/15 2325 05/21/15 0826 05/21/15 0921 05/21/15 1214  GLUCAP 93 90 67 201* 151*       Studies: Dg Ribs Unilateral W/chest Left  05/19/2015  CLINICAL DATA:  Chest wall bruising after a fall. Patient struck face and left upper ribs on a ceramic tub. Vomiting. Shortness of breath due to pain. EXAM: LEFT RIBS AND CHEST - 3+ VIEW COMPARISON:  04/25/2015 FINDINGS: Shallow inspiration. Normal heart size and pulmonary vascularity. Central interstitial pattern to the lungs suggesting chronic bronchitis. Linear scarring in the left lung base. No focal airspace disease or consolidation. No pneumothorax. Calcified and tortuous aorta. Coronary stent. Postoperative changes in the cervical spine. Degenerative changes in the spine and shoulders. Acute and mildly displaced fractures of the left eighth and ninth ribs. IMPRESSION: Chronic bronchitic changes suggested in the lungs. No evidence of active pulmonary disease. No pneumothorax. Acute mildly displaced fractures of the left eighth and ninth ribs anterolaterally. Electronically Signed   By: Burman Nieves M.D.   On: 05/19/2015 20:52   Ct Head Wo Contrast  05/19/2015  CLINICAL DATA:  74 year old female status post fall with redness and swelling around left eye EXAM: CT HEAD WITHOUT CONTRAST CT MAXILLOFACIAL WITHOUT CONTRAST CT CERVICAL SPINE WITHOUT CONTRAST TECHNIQUE: Multidetector CT imaging of the head, cervical spine, and maxillofacial structures were performed using the standard protocol without intravenous contrast. Multiplanar  CT image reconstructions of the cervical spine and maxillofacial structures were also  generated. COMPARISON:  Prior CT scan of the head 12/31/2014 FINDINGS: CT HEAD FINDINGS Negative for acute intracranial hemorrhage, acute infarction, mass, mass effect, hydrocephalus or midline shift. Gray-white differentiation is preserved throughout. Stable cortical and central volume loss with ex vacuo ventriculomegaly. Mild chronic microvascular ischemic white matter disease. No focal scalp contusion or evidence of calvarial fracture. Normally aerated mastoid air cells and visualized paranasal sinuses. Atherosclerotic calcifications present in both cavernous and supraclinoid carotid arteries. CT MAXILLOFACIAL FINDINGS The bilateral globes and orbits are symmetric and unremarkable. Soft tissue swelling is present involving the left face on laterally with extension over the maxillary antrum. Streak artifact from numerous dental amalgams limits evaluation of the dentition and maxilla. No evidence of underlying fracture. Normal aeration of the mastoid air cells and paranasal sinuses. CT CERVICAL SPINE FINDINGS No acute fracture, malalignment or prevertebral soft tissue swelling. Multilevel cervical spondylosis. Surgical changes of anterior cervical discectomy and fusion with interbody graft at C5-C6. Successful bony ankylosis at this level. No significant central stenosis. Subcentimeter left-sided thyroid nodules noted incidentally. No acute soft tissue abnormality. Somewhat spiculated sub solid nodular opacity in the superior segment of the left lower lobe measuring up to 1.3 cm in diameter. IMPRESSION: CT HEAD 1. No acute intracranial abnormality. 2. Stable atrophy, ex vacuo ventriculomegaly and chronic microvascular ischemic white matter disease. CT FACE 1. Soft tissue contusion involving the left face without evidence of underlying fracture. CT CSPINE 1. No acute fracture or malalignment. 2. Incidentally imaged 1.3 cm somewhat spiculated sub solid nodular opacity in the superior segment of the left lower lobe.  This is concerning for an underlying bronchogenic malignancy such as adenocarcinoma. Recommend dedicated (non emergent) CT scan of the chest for complete evaluation. 3. Surgical changes of prior ACDF at C5-C6 without evidence of complication. 4. Mild multilevel cervical spondylosis. 5. Incidentally noted sub cm left thyroid nodules. No dedicated follow-up imaging required. Electronically Signed   By: Malachy Moan M.D.   On: 05/19/2015 21:01   Ct Angio Chest Pe W/cm &/or Wo Cm  05/20/2015  CLINICAL DATA:  Hypoxia.  Lung mass EXAM: CT ANGIOGRAPHY CHEST WITH CONTRAST TECHNIQUE: Multidetector CT imaging of the chest was performed using the standard protocol during bolus administration of intravenous contrast. Multiplanar CT image reconstructions and MIPs were obtained to evaluate the vascular anatomy. CONTRAST:  50mL OMNIPAQUE IOHEXOL 350 MG/ML SOLN COMPARISON:  None. FINDINGS: Technically adequate study with good opacification of the central and segmental pulmonary arteries. No focal filling defects demonstrated. No evidence of significant pulmonary embolus. Mild cardiac enlargement. Coronary artery calcifications. Normal caliber thoracic aorta with scattered calcification. Great vessel origins are patent. No aortic dissection. Contrast material is demonstrated in the esophagus suggesting reflux or dysmotility. No esophageal dilatation. No significant lymphadenopathy in the chest. Focal nodular areas of infiltration in the left upper lung and right lower lung. These are likely inflammatory but three-month follow-up is recommended to exclude persistent ground-glass nodule. Atelectasis in the lung bases. No pleural effusions. No pneumothorax. Airways appear patent. Included portions of the upper abdominal organs demonstrate no gross focal abnormality. Degenerative changes in the spine. Postoperative changes in the cervical spine. Review of the MIP images confirms the above findings. IMPRESSION: No evidence of  significant pulmonary embolus. Atelectasis in the lung bases. Focal nodular infiltrates in the left upper lung and right lower lung are likely inflammatory but follow-up CT in 3 months is recommended to exclude persistence. Electronically  Signed   By: Burman Nieves M.D.   On: 05/20/2015 02:11   Ct Cervical Spine Wo Contrast  05/19/2015  CLINICAL DATA:  74 year old female status post fall with redness and swelling around left eye EXAM: CT HEAD WITHOUT CONTRAST CT MAXILLOFACIAL WITHOUT CONTRAST CT CERVICAL SPINE WITHOUT CONTRAST TECHNIQUE: Multidetector CT imaging of the head, cervical spine, and maxillofacial structures were performed using the standard protocol without intravenous contrast. Multiplanar CT image reconstructions of the cervical spine and maxillofacial structures were also generated. COMPARISON:  Prior CT scan of the head 12/31/2014 FINDINGS: CT HEAD FINDINGS Negative for acute intracranial hemorrhage, acute infarction, mass, mass effect, hydrocephalus or midline shift. Gray-white differentiation is preserved throughout. Stable cortical and central volume loss with ex vacuo ventriculomegaly. Mild chronic microvascular ischemic white matter disease. No focal scalp contusion or evidence of calvarial fracture. Normally aerated mastoid air cells and visualized paranasal sinuses. Atherosclerotic calcifications present in both cavernous and supraclinoid carotid arteries. CT MAXILLOFACIAL FINDINGS The bilateral globes and orbits are symmetric and unremarkable. Soft tissue swelling is present involving the left face on laterally with extension over the maxillary antrum. Streak artifact from numerous dental amalgams limits evaluation of the dentition and maxilla. No evidence of underlying fracture. Normal aeration of the mastoid air cells and paranasal sinuses. CT CERVICAL SPINE FINDINGS No acute fracture, malalignment or prevertebral soft tissue swelling. Multilevel cervical spondylosis. Surgical  changes of anterior cervical discectomy and fusion with interbody graft at C5-C6. Successful bony ankylosis at this level. No significant central stenosis. Subcentimeter left-sided thyroid nodules noted incidentally. No acute soft tissue abnormality. Somewhat spiculated sub solid nodular opacity in the superior segment of the left lower lobe measuring up to 1.3 cm in diameter. IMPRESSION: CT HEAD 1. No acute intracranial abnormality. 2. Stable atrophy, ex vacuo ventriculomegaly and chronic microvascular ischemic white matter disease. CT FACE 1. Soft tissue contusion involving the left face without evidence of underlying fracture. CT CSPINE 1. No acute fracture or malalignment. 2. Incidentally imaged 1.3 cm somewhat spiculated sub solid nodular opacity in the superior segment of the left lower lobe. This is concerning for an underlying bronchogenic malignancy such as adenocarcinoma. Recommend dedicated (non emergent) CT scan of the chest for complete evaluation. 3. Surgical changes of prior ACDF at C5-C6 without evidence of complication. 4. Mild multilevel cervical spondylosis. 5. Incidentally noted sub cm left thyroid nodules. No dedicated follow-up imaging required. Electronically Signed   By: Malachy Moan M.D.   On: 05/19/2015 21:01   Mr Laqueta Jean RU Contrast  05/20/2015  CLINICAL DATA:  Initial evaluation for are falls, weakness. EXAM: MRI HEAD WITHOUT AND WITH CONTRAST TECHNIQUE: Multiplanar, multiecho pulse sequences of the brain and surrounding structures were obtained without and with intravenous contrast. CONTRAST:  10mL MULTIHANCE GADOBENATE DIMEGLUMINE 529 MG/ML IV SOLN COMPARISON:  Prior study from 05/19/2015. FINDINGS: Diffuse prominence of CSF containing spaces is compatible with generalized age-related cerebral atrophy. Patchy T2/FLAIR hyperintensity within the periventricular and deep white matter both cerebral hemispheres most consistent with chronic small vessel ischemic disease, mild in  nature. No abnormal foci of restricted diffusion to suggest acute intracranial infarct. Gray-white matter differentiation maintained. Normal intravascular flow voids are preserved. No acute or chronic intracranial hemorrhage. No mass lesion, midline shift, or mass effect. Ventricular prominence related to global parenchymal volume loss present without hydrocephalus. No extra-axial fluid collection. No abnormal enhancement. Craniocervical junction within normal limits. Pituitary gland normal. No acute abnormality about the orbits. Sequela prior bilateral lens extraction noted. Paranasal sinuses are  clear. No significant mastoid effusion. Inner ear structures grossly normal. Bone marrow signal intensity within normal limits. Scalp soft tissues within normal limits. IMPRESSION: 1. No acute intracranial process. 2. Mild age-related cerebral atrophy with chronic small vessel ischemic disease. Electronically Signed   By: Rise Mu M.D.   On: 05/20/2015 05:33   Dg Pelvis Portable  05/19/2015  CLINICAL DATA:  Patient fell walking to the toilet.  Vomiting. EXAM: PORTABLE PELVIS 1-2 VIEWS COMPARISON:  None. FINDINGS: There is no evidence of pelvic fracture or diastasis. No pelvic bone lesions are seen. IMPRESSION: Negative. Electronically Signed   By: Burman Nieves M.D.   On: 05/19/2015 22:53   Ct Maxillofacial Wo Cm  05/19/2015  CLINICAL DATA:  74 year old female status post fall with redness and swelling around left eye EXAM: CT HEAD WITHOUT CONTRAST CT MAXILLOFACIAL WITHOUT CONTRAST CT CERVICAL SPINE WITHOUT CONTRAST TECHNIQUE: Multidetector CT imaging of the head, cervical spine, and maxillofacial structures were performed using the standard protocol without intravenous contrast. Multiplanar CT image reconstructions of the cervical spine and maxillofacial structures were also generated. COMPARISON:  Prior CT scan of the head 12/31/2014 FINDINGS: CT HEAD FINDINGS Negative for acute intracranial  hemorrhage, acute infarction, mass, mass effect, hydrocephalus or midline shift. Gray-white differentiation is preserved throughout. Stable cortical and central volume loss with ex vacuo ventriculomegaly. Mild chronic microvascular ischemic white matter disease. No focal scalp contusion or evidence of calvarial fracture. Normally aerated mastoid air cells and visualized paranasal sinuses. Atherosclerotic calcifications present in both cavernous and supraclinoid carotid arteries. CT MAXILLOFACIAL FINDINGS The bilateral globes and orbits are symmetric and unremarkable. Soft tissue swelling is present involving the left face on laterally with extension over the maxillary antrum. Streak artifact from numerous dental amalgams limits evaluation of the dentition and maxilla. No evidence of underlying fracture. Normal aeration of the mastoid air cells and paranasal sinuses. CT CERVICAL SPINE FINDINGS No acute fracture, malalignment or prevertebral soft tissue swelling. Multilevel cervical spondylosis. Surgical changes of anterior cervical discectomy and fusion with interbody graft at C5-C6. Successful bony ankylosis at this level. No significant central stenosis. Subcentimeter left-sided thyroid nodules noted incidentally. No acute soft tissue abnormality. Somewhat spiculated sub solid nodular opacity in the superior segment of the left lower lobe measuring up to 1.3 cm in diameter. IMPRESSION: CT HEAD 1. No acute intracranial abnormality. 2. Stable atrophy, ex vacuo ventriculomegaly and chronic microvascular ischemic white matter disease. CT FACE 1. Soft tissue contusion involving the left face without evidence of underlying fracture. CT CSPINE 1. No acute fracture or malalignment. 2. Incidentally imaged 1.3 cm somewhat spiculated sub solid nodular opacity in the superior segment of the left lower lobe. This is concerning for an underlying bronchogenic malignancy such as adenocarcinoma. Recommend dedicated (non emergent)  CT scan of the chest for complete evaluation. 3. Surgical changes of prior ACDF at C5-C6 without evidence of complication. 4. Mild multilevel cervical spondylosis. 5. Incidentally noted sub cm left thyroid nodules. No dedicated follow-up imaging required. Electronically Signed   By: Malachy Moan M.D.   On: 05/19/2015 21:01      Lab Results  Component Value Date   HGBA1C 8.2* 05/20/2015   HGBA1C 8.1* 02/23/2015   HGBA1C 6.6* 12/12/2014   Lab Results  Component Value Date   CREATININE 0.52 05/21/2015       Scheduled Meds: . ARIPiprazole  5 mg Oral q morning - 10a  . aspirin  325 mg Oral Daily  . cholecalciferol  2,000 Units Oral Daily  .  donepezil  10 mg Oral QHS  . enoxaparin (LOVENOX) injection  20 mg Subcutaneous Q24H  . feeding supplement (ENSURE ENLIVE)  237 mL Oral BID BM  . ferrous sulfate  325 mg Oral BID WC  . insulin aspart  0-9 Units Subcutaneous TID WC  . insulin glargine  10 Units Subcutaneous QHS  . lamoTRIgine  50 mg Oral QHS  . multivitamin with minerals  1 tablet Oral Daily  . pantoprazole  40 mg Oral BID  . polyethylene glycol  17 g Oral Daily  . potassium chloride  40 mEq Oral Once  . prazosin  1 mg Oral QHS  . senna-docusate  2 tablet Oral BID  . sertraline  150 mg Oral Daily   Continuous Infusions:   Principal Problem:   Weakness generalized Active Problems:   DM (diabetes mellitus), type 1 with complications (HCC)   Hypokalemia   Rhabdomyolysis   Lung mass   Generalized weakness    Time spent: 45 minutes   [facility] South[facility]OL,Ferrin Liebig  [facility] Pager [phone]. If 7PM-7AM, please contact night-coverage at [email or web address], password [email or web address] 05/21/2015, 12:34 PM  LOS: 1 day

## 2015-05-21 NOTE — NC FL2 (Signed)
[place] MEDICAID FL2 LEVEL OF CARE SCREENING TOOL     IDENTIFICATION  Patient Name: Beth Payne Birthdate: 01/17/1941 Sex: female Admission Date (Current Location): 05/19/2015  [facility] [place]   Facility and Address:  [facility], [address], [place], [place] [address]      Provider Number: [id number]  Attending Physician Name and Address:  Richarda OverlieNayana Abrol, MD  Relative Name and Phone Number:       Current Level of Care: Hospital Recommended Level of Care: Skilled Nursing Facility Prior Approval Number:    Date Approved/Denied:   PASRR Number: [id number]  Discharge Plan: SNF    Current Diagnoses: Patient Active Problem List   Diagnosis Date Noted  . Weakness generalized 05/20/2015  . Rhabdomyolysis 05/20/2015  . Lung mass 05/20/2015  . Generalized weakness 05/20/2015  . Hematemesis 04/24/2015  . Hypokalemia 04/24/2015  . SIRS (systemic inflammatory response syndrome) (HCC) 02/23/2015  . AKI (acute kidney injury) (HCC) 02/23/2015  . Acute encephalopathy 01/01/2015  . Atelectasis of left lung 01/01/2015  . Hyperglycemia   . Fracture, ankle 12/23/2014  . DM (diabetes mellitus), type 1 with complications (HCC) 12/17/2014  . NSTEMI (non-ST elevated myocardial infarction) (HCC) 12/13/2014  . CAD in native artery   . Pulmonary hypertension (HCC)   . Symptomatic bradycardia   . DKA (diabetic ketoacidoses) (HCC) 12/12/2014  . GERD (gastroesophageal reflux disease) 12/12/2014  . CAD s/p stent to mid-RCA in 2001. 12/12/2014  . Osteoarthritis 12/12/2014  . Systolic murmur 12/12/2014  . Normocytic anemia 12/12/2014  . Dyslipidemia 12/12/2014  . Diabetic ketoacidosis without coma associated with type 1 diabetes mellitus (HCC)     Orientation RESPIRATION BLADDER Height & Weight    Self, Time, Situation, Place  Normal Incontinent 5\' 2"  (157.5 cm) 97 lbs.  BEHAVIORAL SYMPTOMS/MOOD NEUROLOGICAL BOWEL NUTRITION  STATUS      Incontinent Diet (dysphasia 3)  AMBULATORY STATUS COMMUNICATION OF NEEDS Skin   Limited Assist Verbally Normal                       Personal Care Assistance Level of Assistance  Bathing, Dressing Bathing Assistance: Limited assistance   Dressing Assistance: Limited assistance     Functional Limitations Info             SPECIAL CARE FACTORS FREQUENCY  PT (By licensed PT), OT (By licensed OT)     PT Frequency: 5/wk OT Frequency: 5/wk            Contractures      Additional Factors Info  Code Status, Allergies, Psychotropic, Insulin Sliding Scale Code Status Info: FULL Allergies Info: Diclofenac Sodium, Doxycycline Calcium, Codeine, Zocor Psychotropic Info: zoloft Insulin Sliding Scale Info: 3/day       Current Medications (05/21/2015):  This is the current hospital active medication list Current Facility-Administered Medications  Medication Dose Route Frequency Provider Last Rate Last Dose  . 0.9 % NaCl with KCl 20 mEq/ L  infusion   Intravenous Continuous Richarda OverlieNayana Abrol, MD      . amLODipine (NORVASC) tablet 10 mg  10 mg Oral Daily Richarda OverlieNayana Abrol, MD      . ARIPiprazole (ABILIFY) tablet 5 mg  5 mg Oral q morning - 10a Eduard ClosArshad N Kakrakandy, MD   5 mg at 05/21/15 0943  . aspirin tablet 325 mg  325 mg Oral Daily Eduard ClosArshad N Kakrakandy, MD   325 mg at 05/21/15 0944  . cholecalciferol (VITAMIN D) tablet 2,000  Units  2,000 Units Oral Daily Eduard Clos, MD   2,000 Units at 05/21/15 [phone]  . donepezil (ARICEPT) tablet 10 mg  10 mg Oral QHS Eduard Clos, MD   10 mg at 05/20/15 2214  . enoxaparin (LOVENOX) injection 20 mg  20 mg Subcutaneous Q24H Eduard Clos, MD   20 mg at 05/21/15 0943  . feeding supplement (ENSURE ENLIVE) (ENSURE ENLIVE) liquid 237 mL  237 mL Oral BID BM Eduard Clos, MD   237 mL at 05/20/15 1114  . fentaNYL (SUBLIMAZE) injection 25 mcg  25 mcg Intravenous Q2H PRN Eduard Clos, MD   25 mcg at 05/21/15 [phone]  .  ferrous sulfate tablet 325 mg  325 mg Oral BID WC Eduard Clos, MD   325 mg at 05/21/15 0839  . insulin aspart (novoLOG) injection 0-9 Units  0-9 Units Subcutaneous TID WC Richarda Overlie, MD   2 Units at 05/21/15 1254  . insulin glargine (LANTUS) injection 10 Units  10 Units Subcutaneous QHS Leda Gauze, NP   10 Units at 05/20/15 2329  . lamoTRIgine (LAMICTAL) tablet 50 mg  50 mg Oral QHS Eduard Clos, MD   50 mg at 05/20/15 2214  . multivitamin with minerals tablet 1 tablet  1 tablet Oral Daily Eduard Clos, MD   1 tablet at 05/21/15 0944  . pantoprazole (PROTONIX) EC tablet 40 mg  40 mg Oral BID Eduard Clos, MD   40 mg at 05/21/15 0943  . polyethylene glycol (MIRALAX / GLYCOLAX) packet 17 g  17 g Oral Daily Eduard Clos, MD   17 g at 05/21/15 [phone]  . prazosin (MINIPRESS) capsule 1 mg  1 mg Oral QHS Eduard Clos, MD   1 mg at 05/20/15 2223  . senna-docusate (Senokot-S) tablet 2 tablet  2 tablet Oral BID Eduard Clos, MD   2 tablet at 05/21/15 [phone]  . sertraline (ZOLOFT) tablet 150 mg  150 mg Oral Daily Eduard Clos, MD   150 mg at 05/21/15 1478     Discharge Medications: Please see discharge summary for a list of discharge medications.  Relevant Imaging Results:  Relevant Lab Results:   Additional Information SS#: [id number]  Izora Ribas, [place]

## 2015-05-22 DIAGNOSIS — E1039 Type 1 diabetes mellitus with other diabetic ophthalmic complication: Secondary | ICD-10-CM | POA: Diagnosis not present

## 2015-05-22 DIAGNOSIS — M6281 Muscle weakness (generalized): Secondary | ICD-10-CM | POA: Diagnosis not present

## 2015-05-22 DIAGNOSIS — D638 Anemia in other chronic diseases classified elsewhere: Secondary | ICD-10-CM | POA: Diagnosis not present

## 2015-05-22 DIAGNOSIS — S2242XS Multiple fractures of ribs, left side, sequela: Secondary | ICD-10-CM | POA: Diagnosis not present

## 2015-05-22 DIAGNOSIS — R296 Repeated falls: Secondary | ICD-10-CM | POA: Diagnosis not present

## 2015-05-22 DIAGNOSIS — R918 Other nonspecific abnormal finding of lung field: Secondary | ICD-10-CM | POA: Diagnosis not present

## 2015-05-22 DIAGNOSIS — I10 Essential (primary) hypertension: Secondary | ICD-10-CM | POA: Diagnosis not present

## 2015-05-22 DIAGNOSIS — M6282 Rhabdomyolysis: Secondary | ICD-10-CM | POA: Diagnosis not present

## 2015-05-22 DIAGNOSIS — E440 Moderate protein-calorie malnutrition: Secondary | ICD-10-CM | POA: Diagnosis not present

## 2015-05-22 DIAGNOSIS — F0390 Unspecified dementia without behavioral disturbance: Secondary | ICD-10-CM | POA: Diagnosis not present

## 2015-05-22 DIAGNOSIS — D72829 Elevated white blood cell count, unspecified: Secondary | ICD-10-CM | POA: Diagnosis not present

## 2015-05-22 DIAGNOSIS — E784 Other hyperlipidemia: Secondary | ICD-10-CM | POA: Diagnosis not present

## 2015-05-22 LAB — COMPREHENSIVE METABOLIC PANEL WITH GFR
ALT: 36 U/L (ref 14–54)
AST: 55 U/L — ABNORMAL HIGH (ref 15–41)
Albumin: 2.9 g/dL — ABNORMAL LOW (ref 3.5–5.0)
Alkaline Phosphatase: 68 U/L (ref 38–126)
Anion gap: 7 (ref 5–15)
BUN: <5 mg/dL — ABNORMAL LOW (ref 6–20)
CO2: 25 mmol/L (ref 22–32)
Calcium: 9.1 mg/dL (ref 8.9–10.3)
Chloride: 103 mmol/L (ref 101–111)
Creatinine, Ser: 0.54 mg/dL (ref 0.44–1.00)
GFR calc Af Amer: >60 mL/min
GFR calc non Af Amer: >60 mL/min
Glucose, Bld: 137 mg/dL — ABNORMAL HIGH (ref 65–99)
Potassium: 4.2 mmol/L (ref 3.5–5.1)
Sodium: 135 mmol/L (ref 135–145)
Total Bilirubin: 0.8 mg/dL (ref 0.3–1.2)
Total Protein: 5 g/dL — ABNORMAL LOW (ref 6.5–8.1)

## 2015-05-22 LAB — CBC
HEMATOCRIT: 30.2 % — AB (ref 36.0–46.0)
HEMOGLOBIN: 9.9 g/dL — AB (ref 12.0–15.0)
MCH: 27.7 pg (ref 26.0–34.0)
MCHC: 32.8 g/dL (ref 30.0–36.0)
MCV: 84.4 fL (ref 78.0–100.0)
Platelets: 191 10*3/uL (ref 150–400)
RBC: 3.58 MIL/uL — AB (ref 3.87–5.11)
RDW: 15.5 % (ref 11.5–15.5)
WBC: 5 10*3/uL (ref 4.0–10.5)

## 2015-05-22 LAB — GLUCOSE, CAPILLARY
GLUCOSE-CAPILLARY: 199 mg/dL — AB (ref 65–99)
Glucose-Capillary: 115 mg/dL — ABNORMAL HIGH (ref 65–99)
Glucose-Capillary: 259 mg/dL — ABNORMAL HIGH (ref 65–99)

## 2015-05-22 MED ORDER — LORAZEPAM 0.5 MG PO TABS: 0.5000 mg | ORAL_TABLET | Freq: Every day | ORAL | Status: DC

## 2015-05-22 MED ORDER — AMLODIPINE BESYLATE 10 MG PO TABS: 10.0000 mg | ORAL_TABLET | Freq: Every day | ORAL | Status: AC

## 2015-05-22 NOTE — Progress Notes (Signed)
Beth Payne to be D/C'd Skilled nursing facility per MD order. Discussed with the patient and all questions fully answered.    Medication List    TAKE these medications        acetaminophen 325 MG tablet  Commonly known as:  TYLENOL  Take 2 tablets (650 mg total) by mouth every 6 (six) hours as needed for mild pain (or Fever >/= 101).     amLODipine 10 MG tablet  Commonly known as:  NORVASC  Take 1 tablet (10 mg total) by mouth daily.     ARIPiprazole 5 MG tablet  Commonly known as:  ABILIFY  Take 1 tablet (5 mg total) by mouth every morning.     aspirin EC 81 MG tablet  Take 1 tablet (81 mg total) by mouth daily.     donepezil 10 MG tablet  Commonly known as:  ARICEPT  Take 10 mg by mouth at bedtime.     feeding supplement (GLUCERNA SHAKE) Liqd  Take 237 mLs by mouth 3 (three) times daily between meals.     ferrous sulfate 325 (65 FE) MG tablet  Take 325 mg by mouth 2 (two) times daily.     insulin aspart 100 UNIT/ML injection  Commonly known as:  novoLOG  0-9 Units, Subcutaneous, 3 times daily with meals CBG < 70: implement hypoglycemia protocol CBG 70 - 120: 0 units CBG 121 - 150: 1 unit CBG 151 - 200: 2 units CBG 201 - 250: 3 units CBG 251 - 300: 5 units CBG 301 - 350: 7 units CBG 351 - 400: 9 units CBG > 400: call MD     insulin glargine 100 UNIT/ML injection  Commonly known as:  LANTUS  Inject 0.18 mLs (18 Units total) into the skin at bedtime.     LAMICTAL 100 MG tablet  Generic drug:  lamoTRIgine  Take 50 mg by mouth at bedtime.     LORazepam 0.5 MG tablet  Commonly known as:  ATIVAN  Take 1 tablet (0.5 mg total) by mouth at bedtime. Anxiety     multivitamin with minerals Tabs tablet  Take 1 tablet by mouth daily.     pantoprazole 40 MG tablet  Commonly known as:  PROTONIX  Take 1 tablet (40 mg total) by mouth 2 (two) times daily.     polyethylene glycol packet  Commonly known as:  MIRALAX / GLYCOLAX  Take 17 g by mouth daily as needed for mild  constipation.     prazosin 1 MG capsule  Commonly known as:  MINIPRESS  Take 1 mg by mouth at bedtime.     rosuvastatin 5 MG tablet  Commonly known as:  CRESTOR  Take 5 mg by mouth daily.     sennosides-docusate sodium 8.6-50 MG tablet  Commonly known as:  SENOKOT-S  Take 2 tablets by mouth 2 (two) times daily.     sertraline 100 MG tablet  Commonly known as:  ZOLOFT  Take 150 mg by mouth daily.     Vitamin D3 2000 UNITS Tabs  Take 2,000 Units by mouth daily. For supplement        VVS, Skin clean, dry and intact without evidence of skin break down, no evidence of skin tears noted.  IV catheter discontinued intact. Site without signs and symptoms of complications. Dressing and pressure applied.  An After Visit Summary was printed and given tobEMS.  Patient escorted via stretcher, and D/C SNF via EMS.  Beckey DowningFlores, Beth Suchy F  05/22/2015 5:39  PM   

## 2015-05-22 NOTE — Care Management Note (Signed)
Case Management Note  Patient Details  Name: Beth Payne MRN: [id number] Date of Birth: 04/25/1941  Subjective/Objective:   Patient is for dc to snf today, CSW is following.                 Action/Plan:   Expected Discharge Date:                  Expected Discharge Plan:  Skilled Nursing Facility  In-House Referral:  Clinical Social Work  Discharge planning Services  CM Consult  Post Acute Care Choice:    Choice offered to:     DME Arranged:    DME Agency:     HH Arranged:    HH Agency:     Status of Service:  Completed, signed off  Medicare Important Message Given:    Date Medicare IM Given:    Medicare IM give by:    Date Additional Medicare IM Given:    Additional Medicare Important Message give by:     If discussed at Long Length of Stay Meetings, dates discussed:    Additional Comments:  Leone Havenaylor, Yorel Redder [place], RN 05/22/2015, 2:44 PM

## 2015-05-22 NOTE — Progress Notes (Signed)
Patient will DC to: [facility] Anticipated DC date: 05/22/15 Family notified: [name] at bedside Transport by: Sharin MonsPTAR  CSW signing off.  Cristobal GoldmannNadia Jireh Vinas, [place] Clinical Social Worker [phone]

## 2015-05-22 NOTE — Clinical Social Work Placement (Signed)
   CLINICAL SOCIAL WORK PLACEMENT  NOTE  Date:  05/22/2015  Patient Details  Name: Beth Payne MRN: [id number] Date of Birth: 10/29/1940  Clinical Social Work is seeking post-discharge placement for this patient at the Skilled  Nursing Facility level of care (*CSW will initial, date and re-position this form in  chart as items are completed):  Yes   Patient/family provided with [place] Clinical Social Work Department's list of facilities offering this level of care within the geographic area requested by the patient (or if unable, by the patient's family).  Yes   Patient/family informed of their freedom to choose among providers that offer the needed level of care, that participate in Medicare, Medicaid or managed care program needed by the patient, have an available bed and are willing to accept the patient.  Yes   Patient/family informed of [facility]'s ownership interest in [facility] and [facility], as well as of the fact that they are under no obligation to receive care at these facilities.  PASRR submitted to EDS on       PASRR number received on       Existing PASRR number confirmed on 05/21/15     FL2 transmitted to all facilities in geographic area requested by pt/family on 05/21/15     FL2 transmitted to all facilities within larger geographic area on       Patient informed that his/her managed care company has contracts with or will negotiate with certain facilities, including the following:        Yes   Patient/family informed of bed offers received.  [facility]     Physician recommends and patient chooses bed at      [facility].  Patient to be transferred to facility by PTAR     Patient family notified on 05/22/15 of transfer.  Name of family member notified:  [name]     PHYSICIAN       Additional Comment:     _______________________________________________ Mearl LatinNadia S Dat Derksen, LCSWA 05/22/2015, 3:41 PM

## 2015-05-22 NOTE — Discharge Summary (Signed)
Physician Discharge Summary  Beth Payne MRN: [id number] DOB/AGE: [birth year] 74 y.o.  PCP: Sheela Stack, MD   Admit date: 05/19/2015 Discharge date: 05/22/2015  Discharge Diagnoses:     Principal Problem:   Weakness generalized Active Problems:   DM (diabetes mellitus), type 1 with complications (HCC)   Hypokalemia   Rhabdomyolysis   Lung mass   Generalized weakness    Follow-up recommendations Follow-up with PCP in 3-5 days , including all  additional recommended appointments as below Follow-up CBC,ck,  CMP in 3-5 days CT scan of the chest recommended for further evaluation of lung nodule with repeat CT in 3 monthsto exclude persistence.,         Medication List    TAKE these medications        acetaminophen 325 MG tablet  Commonly known as:  TYLENOL  Take 2 tablets (650 mg total) by mouth every 6 (six) hours as needed for mild pain (or Fever >/= 101).     amLODipine 10 MG tablet  Commonly known as:  NORVASC  Take 1 tablet (10 mg total) by mouth daily.     ARIPiprazole 5 MG tablet  Commonly known as:  ABILIFY  Take 1 tablet (5 mg total) by mouth every morning.     aspirin EC 81 MG tablet  Take 1 tablet (81 mg total) by mouth daily.     donepezil 10 MG tablet  Commonly known as:  ARICEPT  Take 10 mg by mouth at bedtime.     feeding supplement (GLUCERNA SHAKE) Liqd  Take 237 mLs by mouth 3 (three) times daily between meals.     ferrous sulfate 325 (65 FE) MG tablet  Take 325 mg by mouth 2 (two) times daily.     insulin aspart 100 UNIT/ML injection  Commonly known as:  novoLOG  0-9 Units, Subcutaneous, 3 times daily with meals CBG < 70: implement hypoglycemia protocol CBG 70 - 120: 0 units CBG 121 - 150: 1 unit CBG 151 - 200: 2 units CBG 201 - 250: 3 units CBG 251 - 300: 5 units CBG 301 - 350: 7 units CBG 351 - 400: 9 units CBG > 400: call MD     insulin glargine 100 UNIT/ML injection  Commonly known as:  LANTUS  Inject 0.18 mLs (18 Units  total) into the skin at bedtime.     LAMICTAL 100 MG tablet  Generic drug:  lamoTRIgine  Take 50 mg by mouth at bedtime.     LORazepam 0.5 MG tablet  Commonly known as:  ATIVAN  Take 1 tablet (0.5 mg total) by mouth at bedtime. Anxiety     multivitamin with minerals Tabs tablet  Take 1 tablet by mouth daily.     pantoprazole 40 MG tablet  Commonly known as:  PROTONIX  Take 1 tablet (40 mg total) by mouth 2 (two) times daily.     polyethylene glycol packet  Commonly known as:  MIRALAX / GLYCOLAX  Take 17 g by mouth daily as needed for mild constipation.     prazosin 1 MG capsule  Commonly known as:  MINIPRESS  Take 1 mg by mouth at bedtime.     rosuvastatin 5 MG tablet  Commonly known as:  CRESTOR  Take 5 mg by mouth daily.     sennosides-docusate sodium 8.6-50 MG tablet  Commonly known as:  SENOKOT-S  Take 2 tablets by mouth 2 (two) times daily.     sertraline 100 MG tablet  Commonly known  as:  ZOLOFT  Take 150 mg by mouth daily.     Vitamin D3 2000 UNITS Tabs  Take 2,000 Units by mouth daily. For supplement         Discharge Condition:  Stable   Discharge Instructions       Discharge Instructions    Diet - low sodium heart healthy    Complete by:  As directed      Increase activity slowly    Complete by:  As directed            Allergies  Allergen Reactions  . Diclofenac Sodium Swelling and Other (See Comments)    Blurred vision  . Doxycycline Calcium Swelling and Other (See Comments)    Blurred vision  . Codeine Nausea And Vomiting and Other (See Comments)    Dizziness   . Zocor [Simvastatin] Other (See Comments)    Dizziness, weakness       Disposition: 03-Skilled Nursing Facility   Consults: * None     Significant Diagnostic Studies:  Dg Ribs Unilateral W/chest Left  05/19/2015  CLINICAL DATA:  Chest wall bruising after a fall. Patient struck face and left upper ribs on a ceramic tub. Vomiting. Shortness of breath due to pain.  EXAM: LEFT RIBS AND CHEST - 3+ VIEW COMPARISON:  04/25/2015 FINDINGS: Shallow inspiration. Normal heart size and pulmonary vascularity. Central interstitial pattern to the lungs suggesting chronic bronchitis. Linear scarring in the left lung base. No focal airspace disease or consolidation. No pneumothorax. Calcified and tortuous aorta. Coronary stent. Postoperative changes in the cervical spine. Degenerative changes in the spine and shoulders. Acute and mildly displaced fractures of the left eighth and ninth ribs. IMPRESSION: Chronic bronchitic changes suggested in the lungs. No evidence of active pulmonary disease. No pneumothorax. Acute mildly displaced fractures of the left eighth and ninth ribs anterolaterally. Electronically Signed   By: Lucienne Capers M.D.   On: 05/19/2015 20:52   Ct Head Wo Contrast  05/19/2015  CLINICAL DATA:  74 year old female status post fall with redness and swelling around left eye EXAM: CT HEAD WITHOUT CONTRAST CT MAXILLOFACIAL WITHOUT CONTRAST CT CERVICAL SPINE WITHOUT CONTRAST TECHNIQUE: Multidetector CT imaging of the head, cervical spine, and maxillofacial structures were performed using the standard protocol without intravenous contrast. Multiplanar CT image reconstructions of the cervical spine and maxillofacial structures were also generated. COMPARISON:  Prior CT scan of the head 12/31/2014 FINDINGS: CT HEAD FINDINGS Negative for acute intracranial hemorrhage, acute infarction, mass, mass effect, hydrocephalus or midline shift. Gray-white differentiation is preserved throughout. Stable cortical and central volume loss with ex vacuo ventriculomegaly. Mild chronic microvascular ischemic white matter disease. No focal scalp contusion or evidence of calvarial fracture. Normally aerated mastoid air cells and visualized paranasal sinuses. Atherosclerotic calcifications present in both cavernous and supraclinoid carotid arteries. CT MAXILLOFACIAL FINDINGS The bilateral globes  and orbits are symmetric and unremarkable. Soft tissue swelling is present involving the left face on laterally with extension over the maxillary antrum. Streak artifact from numerous dental amalgams limits evaluation of the dentition and maxilla. No evidence of underlying fracture. Normal aeration of the mastoid air cells and paranasal sinuses. CT CERVICAL SPINE FINDINGS No acute fracture, malalignment or prevertebral soft tissue swelling. Multilevel cervical spondylosis. Surgical changes of anterior cervical discectomy and fusion with interbody graft at C5-C6. Successful bony ankylosis at this level. No significant central stenosis. Subcentimeter left-sided thyroid nodules noted incidentally. No acute soft tissue abnormality. Somewhat spiculated sub solid nodular opacity in the superior segment of  the left lower lobe measuring up to 1.3 cm in diameter. IMPRESSION: CT HEAD 1. No acute intracranial abnormality. 2. Stable atrophy, ex vacuo ventriculomegaly and chronic microvascular ischemic white matter disease. CT FACE 1. Soft tissue contusion involving the left face without evidence of underlying fracture. CT CSPINE 1. No acute fracture or malalignment. 2. Incidentally imaged 1.3 cm somewhat spiculated sub solid nodular opacity in the superior segment of the left lower lobe. This is concerning for an underlying bronchogenic malignancy such as adenocarcinoma. Recommend dedicated (non emergent) CT scan of the chest for complete evaluation. 3. Surgical changes of prior ACDF at C5-C6 without evidence of complication. 4. Mild multilevel cervical spondylosis. 5. Incidentally noted sub cm left thyroid nodules. No dedicated follow-up imaging required. Electronically Signed   By: Jacqulynn Cadet M.D.   On: 05/19/2015 21:01   Ct Angio Chest Pe W/cm &/or Wo Cm  05/20/2015  CLINICAL DATA:  Hypoxia.  Lung mass EXAM: CT ANGIOGRAPHY CHEST WITH CONTRAST TECHNIQUE: Multidetector CT imaging of the chest was performed using the  standard protocol during bolus administration of intravenous contrast. Multiplanar CT image reconstructions and MIPs were obtained to evaluate the vascular anatomy. CONTRAST:  4m OMNIPAQUE IOHEXOL 350 MG/ML SOLN COMPARISON:  None. FINDINGS: Technically adequate study with good opacification of the central and segmental pulmonary arteries. No focal filling defects demonstrated. No evidence of significant pulmonary embolus. Mild cardiac enlargement. Coronary artery calcifications. Normal caliber thoracic aorta with scattered calcification. Great vessel origins are patent. No aortic dissection. Contrast material is demonstrated in the esophagus suggesting reflux or dysmotility. No esophageal dilatation. No significant lymphadenopathy in the chest. Focal nodular areas of infiltration in the left upper lung and right lower lung. These are likely inflammatory but three-month follow-up is recommended to exclude persistent ground-glass nodule. Atelectasis in the lung bases. No pleural effusions. No pneumothorax. Airways appear patent. Included portions of the upper abdominal organs demonstrate no gross focal abnormality. Degenerative changes in the spine. Postoperative changes in the cervical spine. Review of the MIP images confirms the above findings. IMPRESSION: No evidence of significant pulmonary embolus. Atelectasis in the lung bases. Focal nodular infiltrates in the left upper lung and right lower lung are likely inflammatory but follow-up CT in 3 months is recommended to exclude persistence. Electronically Signed   By: WLucienne CapersM.D.   On: 05/20/2015 02:11   Ct Cervical Spine Wo Contrast  05/19/2015  CLINICAL DATA:  74year old female status post fall with redness and swelling around left eye EXAM: CT HEAD WITHOUT CONTRAST CT MAXILLOFACIAL WITHOUT CONTRAST CT CERVICAL SPINE WITHOUT CONTRAST TECHNIQUE: Multidetector CT imaging of the head, cervical spine, and maxillofacial structures were performed using  the standard protocol without intravenous contrast. Multiplanar CT image reconstructions of the cervical spine and maxillofacial structures were also generated. COMPARISON:  Prior CT scan of the head 12/31/2014 FINDINGS: CT HEAD FINDINGS Negative for acute intracranial hemorrhage, acute infarction, mass, mass effect, hydrocephalus or midline shift. Gray-white differentiation is preserved throughout. Stable cortical and central volume loss with ex vacuo ventriculomegaly. Mild chronic microvascular ischemic white matter disease. No focal scalp contusion or evidence of calvarial fracture. Normally aerated mastoid air cells and visualized paranasal sinuses. Atherosclerotic calcifications present in both cavernous and supraclinoid carotid arteries. CT MAXILLOFACIAL FINDINGS The bilateral globes and orbits are symmetric and unremarkable. Soft tissue swelling is present involving the left face on laterally with extension over the maxillary antrum. Streak artifact from numerous dental amalgams limits evaluation of the dentition and maxilla. No evidence of  underlying fracture. Normal aeration of the mastoid air cells and paranasal sinuses. CT CERVICAL SPINE FINDINGS No acute fracture, malalignment or prevertebral soft tissue swelling. Multilevel cervical spondylosis. Surgical changes of anterior cervical discectomy and fusion with interbody graft at C5-C6. Successful bony ankylosis at this level. No significant central stenosis. Subcentimeter left-sided thyroid nodules noted incidentally. No acute soft tissue abnormality. Somewhat spiculated sub solid nodular opacity in the superior segment of the left lower lobe measuring up to 1.3 cm in diameter. IMPRESSION: CT HEAD 1. No acute intracranial abnormality. 2. Stable atrophy, ex vacuo ventriculomegaly and chronic microvascular ischemic white matter disease. CT FACE 1. Soft tissue contusion involving the left face without evidence of underlying fracture. CT CSPINE 1. No acute  fracture or malalignment. 2. Incidentally imaged 1.3 cm somewhat spiculated sub solid nodular opacity in the superior segment of the left lower lobe. This is concerning for an underlying bronchogenic malignancy such as adenocarcinoma. Recommend dedicated (non emergent) CT scan of the chest for complete evaluation. 3. Surgical changes of prior ACDF at C5-C6 without evidence of complication. 4. Mild multilevel cervical spondylosis. 5. Incidentally noted sub cm left thyroid nodules. No dedicated follow-up imaging required. Electronically Signed   By: Jacqulynn Cadet M.D.   On: 05/19/2015 21:01   Mr Jeri Cos PF Contrast  05/20/2015  CLINICAL DATA:  Initial evaluation for are falls, weakness. EXAM: MRI HEAD WITHOUT AND WITH CONTRAST TECHNIQUE: Multiplanar, multiecho pulse sequences of the brain and surrounding structures were obtained without and with intravenous contrast. CONTRAST:  66m MULTIHANCE GADOBENATE DIMEGLUMINE 529 MG/ML IV SOLN COMPARISON:  Prior study from 05/19/2015. FINDINGS: Diffuse prominence of CSF containing spaces is compatible with generalized age-related cerebral atrophy. Patchy T2/FLAIR hyperintensity within the periventricular and deep white matter both cerebral hemispheres most consistent with chronic small vessel ischemic disease, mild in nature. No abnormal foci of restricted diffusion to suggest acute intracranial infarct. Gray-white matter differentiation maintained. Normal intravascular flow voids are preserved. No acute or chronic intracranial hemorrhage. No mass lesion, midline shift, or mass effect. Ventricular prominence related to global parenchymal volume loss present without hydrocephalus. No extra-axial fluid collection. No abnormal enhancement. Craniocervical junction within normal limits. Pituitary gland normal. No acute abnormality about the orbits. Sequela prior bilateral lens extraction noted. Paranasal sinuses are clear. No significant mastoid effusion. Inner ear  structures grossly normal. Bone marrow signal intensity within normal limits. Scalp soft tissues within normal limits. IMPRESSION: 1. No acute intracranial process. 2. Mild age-related cerebral atrophy with chronic small vessel ischemic disease. Electronically Signed   By: BJeannine BogaM.D.   On: 05/20/2015 05:33   Dg Esophagus  04/28/2015  CLINICAL DATA:  Painful and difficulty swallowing. EXAM: ESOPHOGRAM/BARIUM SWALLOW TECHNIQUE: Single contrast examination was performed using water soluble contrast. FLUOROSCOPY TIME:  Fluoroscopy Time:  1 minute 38 seconds COMPARISON:  None. FINDINGS: The patient ingested water-soluble contrast. There is no evidence of any leak from the esophagus. There were poor primary and secondary stripping waves. There is no obstruction or mass lesion. No visible esophagitis. The patient ingested a 13 mm barium tablet. It passed into the distal esophagus but did not pass into the stomach despite repeated swallows of water. I think this is more likely due to poor esophageal peristalsis and a true obstruction. The patient may have early achalasia. IMPRESSION: No evidence of esophageal tear. 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 13 mm barium tablet never passed into the stomach. Electronically Signed   By: JLorriane ShireM.D.   On:  04/28/2015 10:20   Dg Pelvis Portable  05/19/2015  CLINICAL DATA:  Patient fell walking to the toilet.  Vomiting. EXAM: PORTABLE PELVIS 1-2 VIEWS COMPARISON:  None. FINDINGS: There is no evidence of pelvic fracture or diastasis. No pelvic bone lesions are seen. IMPRESSION: Negative. Electronically Signed   By: Lucienne Capers M.D.   On: 05/19/2015 22:53   Dg Chest Port 1 View  04/25/2015  CLINICAL DATA:  Leukocytosis EXAM: PORTABLE CHEST 1 VIEW COMPARISON:  02/23/2015 FINDINGS: Cardiomediastinal silhouette is stable. No acute infiltrate or pleural effusion. No pulmonary edema. Stable scarring in lingula. Central mild bronchitic  changes. Prior vertebroplasty lower thoracic spine again noted. Degenerative changes bilateral shoulders. IMPRESSION: No acute infiltrate or pulmonary edema. Central mild bronchitic changes. Electronically Signed   By: Lahoma Crocker M.D.   On: 04/25/2015 09:02   Ct Maxillofacial Wo Cm  05/19/2015  CLINICAL DATA:  74 year old female status post fall with redness and swelling around left eye EXAM: CT HEAD WITHOUT CONTRAST CT MAXILLOFACIAL WITHOUT CONTRAST CT CERVICAL SPINE WITHOUT CONTRAST TECHNIQUE: Multidetector CT imaging of the head, cervical spine, and maxillofacial structures were performed using the standard protocol without intravenous contrast. Multiplanar CT image reconstructions of the cervical spine and maxillofacial structures were also generated. COMPARISON:  Prior CT scan of the head 12/31/2014 FINDINGS: CT HEAD FINDINGS Negative for acute intracranial hemorrhage, acute infarction, mass, mass effect, hydrocephalus or midline shift. Gray-white differentiation is preserved throughout. Stable cortical and central volume loss with ex vacuo ventriculomegaly. Mild chronic microvascular ischemic white matter disease. No focal scalp contusion or evidence of calvarial fracture. Normally aerated mastoid air cells and visualized paranasal sinuses. Atherosclerotic calcifications present in both cavernous and supraclinoid carotid arteries. CT MAXILLOFACIAL FINDINGS The bilateral globes and orbits are symmetric and unremarkable. Soft tissue swelling is present involving the left face on laterally with extension over the maxillary antrum. Streak artifact from numerous dental amalgams limits evaluation of the dentition and maxilla. No evidence of underlying fracture. Normal aeration of the mastoid air cells and paranasal sinuses. CT CERVICAL SPINE FINDINGS No acute fracture, malalignment or prevertebral soft tissue swelling. Multilevel cervical spondylosis. Surgical changes of anterior cervical discectomy and fusion  with interbody graft at C5-C6. Successful bony ankylosis at this level. No significant central stenosis. Subcentimeter left-sided thyroid nodules noted incidentally. No acute soft tissue abnormality. Somewhat spiculated sub solid nodular opacity in the superior segment of the left lower lobe measuring up to 1.3 cm in diameter. IMPRESSION: CT HEAD 1. No acute intracranial abnormality. 2. Stable atrophy, ex vacuo ventriculomegaly and chronic microvascular ischemic white matter disease. CT FACE 1. Soft tissue contusion involving the left face without evidence of underlying fracture. CT CSPINE 1. No acute fracture or malalignment. 2. Incidentally imaged 1.3 cm somewhat spiculated sub solid nodular opacity in the superior segment of the left lower lobe. This is concerning for an underlying bronchogenic malignancy such as adenocarcinoma. Recommend dedicated (non emergent) CT scan of the chest for complete evaluation. 3. Surgical changes of prior ACDF at C5-C6 without evidence of complication. 4. Mild multilevel cervical spondylosis. 5. Incidentally noted sub cm left thyroid nodules. No dedicated follow-up imaging required. Electronically Signed   By: Jacqulynn Cadet M.D.   On: 05/19/2015 21:01        Filed Weights   05/20/15 0005  Weight: 44.4 kg (97 lb 14.2 oz)     Microbiology: No results found for this or any previous visit (from the past 240 hour(s)).     Blood Culture  Component Value Date/Time   SDES URINE, CLEAN CATCH 02/23/2015 1433   SPECREQUEST NONE 02/23/2015 1433   CULT MULTIPLE SPECIES PRESENT, SUGGEST RECOLLECTION 02/23/2015 1433   REPTSTATUS 02/24/2015 FINAL 02/23/2015 1433      Labs: Results for orders placed or performed during the hospital encounter of 05/19/15 (from the past 48 hour(s))  Troponin I (q 6hr x 3)     Status: Abnormal   Collection Time: 05/20/15  1:26 PM  Result Value Ref Range   Troponin I 0.07 (H) <0.031 ng/mL    Comment:        PERSISTENTLY  INCREASED TROPONIN VALUES IN THE RANGE OF 0.04-0.49 ng/mL CAN BE SEEN IN:       -UNSTABLE ANGINA       -CONGESTIVE HEART FAILURE       -MYOCARDITIS       -CHEST TRAUMA       -ARRYHTHMIAS       -LATE PRESENTING MYOCARDIAL INFARCTION       -COPD   CLINICAL FOLLOW-UP RECOMMENDED.   Glucose, capillary     Status: Abnormal   Collection Time: 05/20/15  5:13 PM  Result Value Ref Range   Glucose-Capillary 198 (H) 65 - 99 mg/dL  Glucose, capillary     Status: None   Collection Time: 05/20/15  9:40 PM  Result Value Ref Range   Glucose-Capillary 93 65 - 99 mg/dL  Glucose, capillary     Status: None   Collection Time: 05/20/15 11:25 PM  Result Value Ref Range   Glucose-Capillary 90 65 - 99 mg/dL   Comment 1 Notify RN   CBC     Status: Abnormal   Collection Time: 05/21/15  6:05 AM  Result Value Ref Range   WBC 8.1 4.0 - 10.5 K/uL   RBC 3.32 (L) 3.87 - 5.11 MIL/uL   Hemoglobin 9.1 (L) 12.0 - 15.0 g/dL   HCT 28.3 (L) 36.0 - 46.0 %   MCV 85.2 78.0 - 100.0 fL   MCH 27.4 26.0 - 34.0 pg   MCHC 32.2 30.0 - 36.0 g/dL   RDW 15.8 (H) 11.5 - 15.5 %   Platelets 174 150 - 400 K/uL  Comprehensive metabolic panel     Status: Abnormal   Collection Time: 05/21/15  6:05 AM  Result Value Ref Range   Sodium 136 135 - 145 mmol/L   Potassium 3.4 (L) 3.5 - 5.1 mmol/L   Chloride 105 101 - 111 mmol/L   CO2 25 22 - 32 mmol/L   Glucose, Bld 65 65 - 99 mg/dL   BUN <5 (L) 6 - 20 mg/dL   Creatinine, Ser 0.52 0.44 - 1.00 mg/dL   Calcium 8.7 (L) 8.9 - 10.3 mg/dL   Total Protein 4.9 (L) 6.5 - 8.1 g/dL   Albumin 2.9 (L) 3.5 - 5.0 g/dL   AST 71 (H) 15 - 41 U/L   ALT 36 14 - 54 U/L   Alkaline Phosphatase 64 38 - 126 U/L   Total Bilirubin 0.6 0.3 - 1.2 mg/dL   GFR calc non Af Amer >60 >60 mL/min   GFR calc Af Amer >60 >60 mL/min    Comment: (NOTE) The eGFR has been calculated using the CKD EPI equation. This calculation has not been validated in all clinical situations. eGFR's persistently <60 mL/min  signify possible Chronic Kidney Disease.    Anion gap 6 5 - 15  CK     Status: Abnormal   Collection Time: 05/21/15  6:05 AM  Result  Value Ref Range   Total CK 2544 (H) 38 - 234 U/L  Glucose, capillary     Status: None   Collection Time: 05/21/15  8:26 AM  Result Value Ref Range   Glucose-Capillary 67 65 - 99 mg/dL  Glucose, capillary     Status: Abnormal   Collection Time: 05/21/15  9:21 AM  Result Value Ref Range   Glucose-Capillary 201 (H) 65 - 99 mg/dL  Glucose, capillary     Status: Abnormal   Collection Time: 05/21/15 12:14 PM  Result Value Ref Range   Glucose-Capillary 151 (H) 65 - 99 mg/dL  Glucose, capillary     Status: Abnormal   Collection Time: 05/21/15  5:04 PM  Result Value Ref Range   Glucose-Capillary 110 (H) 65 - 99 mg/dL  Glucose, capillary     Status: Abnormal   Collection Time: 05/21/15  9:10 PM  Result Value Ref Range   Glucose-Capillary 231 (H) 65 - 99 mg/dL  Comprehensive metabolic panel     Status: Abnormal   Collection Time: 05/22/15  6:17 AM  Result Value Ref Range   Sodium 135 135 - 145 mmol/L   Potassium 4.2 3.5 - 5.1 mmol/L    Comment: SPECIMEN HEMOLYZED. HEMOLYSIS MAY AFFECT INTEGRITY OF RESULTS.   Chloride 103 101 - 111 mmol/L   CO2 25 22 - 32 mmol/L   Glucose, Bld 137 (H) 65 - 99 mg/dL   BUN <5 (L) 6 - 20 mg/dL   Creatinine, Ser 0.54 0.44 - 1.00 mg/dL   Calcium 9.1 8.9 - 10.3 mg/dL   Total Protein 5.0 (L) 6.5 - 8.1 g/dL   Albumin 2.9 (L) 3.5 - 5.0 g/dL   AST 55 (H) 15 - 41 U/L   ALT 36 14 - 54 U/L   Alkaline Phosphatase 68 38 - 126 U/L   Total Bilirubin 0.8 0.3 - 1.2 mg/dL   GFR calc non Af Amer >60 >60 mL/min   GFR calc Af Amer >60 >60 mL/min    Comment: (NOTE) The eGFR has been calculated using the CKD EPI equation. This calculation has not been validated in all clinical situations. eGFR's persistently <60 mL/min signify possible Chronic Kidney Disease.    Anion gap 7 5 - 15  CBC     Status: Abnormal   Collection Time: 05/22/15   6:17 AM  Result Value Ref Range   WBC 5.0 4.0 - 10.5 K/uL   RBC 3.58 (L) 3.87 - 5.11 MIL/uL   Hemoglobin 9.9 (L) 12.0 - 15.0 g/dL   HCT 30.2 (L) 36.0 - 46.0 %   MCV 84.4 78.0 - 100.0 fL   MCH 27.7 26.0 - 34.0 pg   MCHC 32.8 30.0 - 36.0 g/dL   RDW 15.5 11.5 - 15.5 %   Platelets 191 150 - 400 K/uL  Glucose, capillary     Status: Abnormal   Collection Time: 05/22/15  7:48 AM  Result Value Ref Range   Glucose-Capillary 115 (H) 65 - 99 mg/dL  Glucose, capillary     Status: Abnormal   Collection Time: 05/22/15 11:59 AM  Result Value Ref Range   Glucose-Capillary 199 (H) 65 - 99 mg/dL     Lipid Panel  No results found for: CHOL, TRIG, HDL, CHOLHDL, VLDL, LDLCALC, LDLDIRECT   Lab Results  Component Value Date   HGBA1C 8.2* 05/20/2015   HGBA1C 8.1* 02/23/2015   HGBA1C 6.6* 12/12/2014     Lab Results  Component Value Date   CREATININE 0.54 05/22/2015  HPI: Beth Payne is a 74 y.o. female with history of diabetes mellitus type 1, hypertension, chronic anemia and dementia who was recently admitted last month for DKA was brought to the ER the patient was found to have difficulty walking after a fall. Patient states she was trying to sit on that, when she missed and fell onto the floor. Patient had bruised her left side of the face and chest. Patient was not able to get up from the fall in position. Later patient's friend found her on the floor and was brought to the ER. Patient was found to have difficulty walking even a few steps. CT of the head and C-spine and maxillofacial did not show anything acute except for soft tissue contusion. X-rays do reveal left-sided rib fractures. Patient also is mildly hypoxic. CAT scan of the neck did show some spiculated nodule in the left lung concerning for bronchogenic carcinoma and will need further study. CK levels were elevated. Patient's potassium was low and patient has been admitted for further management of weakness with rib fractures  and rhabdomyolysis.  Assessment and plan  1. Generalized weakness with fall and rhabdomyolysis - rx with iv  hydration. Stable troponins and elevated  CK levels 7957 from the fall ,now improving . physical therapy recommends SNF placement. Had  MRI brain shows   no acute issues . Holding off statins due to rhabdomyolysis , Cannnot r/o arrythmia in the setting of hypokalemia , Patient states it was a mechanical fall and she just missed the commode .Reduced dose of lorazepam. 2. Hypokalemia - cause not clear probably from poor oral intake. Replaced and recheck.  3. Spiculated lung lesion found in the CAT scan of the neck - CT chest PE protocol without any evidence of pulmonary embolism, possible nodular infiltrate in the left upper lung and right lower lung, follow-up CT in 3 months is recommended to exclude persistence.Acute mildly displaced fractures of the left eighth and ninth ribs Anterolaterally. 4. Rib fractures - pain control at this time.incentive spirometry  5. Diabetes mellitus type 1 - presently on Lantus , cont sliding scale coverage. 6. Dementia - continue home medications. 7. Leukocytosis - maybe reactionary. CT chest is pending. 8. Chronic anemia - CBC trending down 9.8>9.1>9.9 9. Patient has multiple bruises on the skin - for which I have ordered tetanus booster dose. 10. HTN -uncontrolled , started norvasc , continue Minipress    Discharge Exam:    Blood pressure 153/57, pulse 68, temperature 97.6 F (36.4 C), temperature source Oral, resp. rate 18, height 5' 2"  (1.575 m), weight 44.4 kg (97 lb 14.2 oz), SpO2 100 %.  General: No acute respiratory distress Lungs: Clear to auscultation bilaterally without wheezes or crackles Cardiovascular: Regular rate and rhythm without murmur gallop or rub normal S1 and S2 Abdomen: Nontender, nondistended, soft, bowel sounds positive, no rebound, no ascites, no appreciable mass Extremities: No significant cyanosis, clubbing, or  edema bilateral lower extremities     Follow-up Information    Follow up with Sheela Stack, MD. Schedule an appointment as soon as possible for a visit in 3 days.   Specialty:  Endocrinology   Contact information:   [place] [phone]       Signed: Reyne Dumas 05/22/2015, 12:39 PM        Time spent >45 mins

## 2015-05-22 NOTE — Progress Notes (Signed)
Report called to [facility].  °

## 2015-05-24 DIAGNOSIS — E109 Type 1 diabetes mellitus without complications: Secondary | ICD-10-CM | POA: Diagnosis not present

## 2015-05-24 DIAGNOSIS — F0390 Unspecified dementia without behavioral disturbance: Secondary | ICD-10-CM | POA: Diagnosis not present

## 2015-05-24 DIAGNOSIS — D649 Anemia, unspecified: Secondary | ICD-10-CM | POA: Diagnosis not present

## 2015-05-24 DIAGNOSIS — I10 Essential (primary) hypertension: Secondary | ICD-10-CM | POA: Diagnosis not present

## 2015-05-26 DIAGNOSIS — S2232XA Fracture of one rib, left side, initial encounter for closed fracture: Secondary | ICD-10-CM | POA: Diagnosis not present

## 2015-05-26 DIAGNOSIS — E162 Hypoglycemia, unspecified: Secondary | ICD-10-CM | POA: Diagnosis not present

## 2015-05-26 DIAGNOSIS — E109 Type 1 diabetes mellitus without complications: Secondary | ICD-10-CM | POA: Diagnosis not present

## 2015-05-26 DIAGNOSIS — I10 Essential (primary) hypertension: Secondary | ICD-10-CM | POA: Diagnosis not present

## 2015-05-27 DIAGNOSIS — E109 Type 1 diabetes mellitus without complications: Secondary | ICD-10-CM | POA: Diagnosis not present

## 2015-05-27 DIAGNOSIS — I10 Essential (primary) hypertension: Secondary | ICD-10-CM | POA: Diagnosis not present

## 2015-05-27 DIAGNOSIS — F0390 Unspecified dementia without behavioral disturbance: Secondary | ICD-10-CM | POA: Diagnosis not present

## 2015-05-27 DIAGNOSIS — S2232XA Fracture of one rib, left side, initial encounter for closed fracture: Secondary | ICD-10-CM | POA: Diagnosis not present

## 2015-05-29 DIAGNOSIS — E109 Type 1 diabetes mellitus without complications: Secondary | ICD-10-CM | POA: Diagnosis not present

## 2015-05-29 DIAGNOSIS — F0390 Unspecified dementia without behavioral disturbance: Secondary | ICD-10-CM | POA: Diagnosis not present

## 2015-05-29 DIAGNOSIS — I10 Essential (primary) hypertension: Secondary | ICD-10-CM | POA: Diagnosis not present

## 2015-05-29 DIAGNOSIS — J984 Other disorders of lung: Secondary | ICD-10-CM | POA: Diagnosis not present

## 2015-06-13 DIAGNOSIS — E109 Type 1 diabetes mellitus without complications: Secondary | ICD-10-CM | POA: Diagnosis not present

## 2015-06-13 DIAGNOSIS — J984 Other disorders of lung: Secondary | ICD-10-CM | POA: Diagnosis not present

## 2015-06-13 DIAGNOSIS — F0390 Unspecified dementia without behavioral disturbance: Secondary | ICD-10-CM | POA: Diagnosis not present

## 2015-06-13 DIAGNOSIS — I10 Essential (primary) hypertension: Secondary | ICD-10-CM | POA: Diagnosis not present

## 2015-06-19 ENCOUNTER — Encounter (HOSPITAL_COMMUNITY): Payer: Self-pay | Admitting: Emergency Medicine

## 2015-06-19 ENCOUNTER — Emergency Department (HOSPITAL_COMMUNITY): Payer: Medicare Other

## 2015-06-19 ENCOUNTER — Emergency Department (HOSPITAL_COMMUNITY)
Admission: EM | Admit: 2015-06-19 | Discharge: 2015-06-20 | Disposition: A | Discharge status: Home / Self Care | Payer: Medicare Other | Source: Home / Self Care | Attending: Emergency Medicine | Admitting: Emergency Medicine

## 2015-06-19 DIAGNOSIS — E1010 Type 1 diabetes mellitus with ketoacidosis without coma: Secondary | ICD-10-CM | POA: Diagnosis not present

## 2015-06-19 DIAGNOSIS — F419 Anxiety disorder, unspecified: Secondary | ICD-10-CM | POA: Insufficient documentation

## 2015-06-19 DIAGNOSIS — N179 Acute kidney failure, unspecified: Secondary | ICD-10-CM | POA: Diagnosis not present

## 2015-06-19 DIAGNOSIS — R410 Disorientation, unspecified: Secondary | ICD-10-CM | POA: Insufficient documentation

## 2015-06-19 DIAGNOSIS — E108 Type 1 diabetes mellitus with unspecified complications: Secondary | ICD-10-CM | POA: Diagnosis not present

## 2015-06-19 DIAGNOSIS — R112 Nausea with vomiting, unspecified: Secondary | ICD-10-CM

## 2015-06-19 DIAGNOSIS — Z9861 Coronary angioplasty status: Secondary | ICD-10-CM | POA: Insufficient documentation

## 2015-06-19 DIAGNOSIS — Z794 Long term (current) use of insulin: Secondary | ICD-10-CM | POA: Insufficient documentation

## 2015-06-19 DIAGNOSIS — Z7982 Long term (current) use of aspirin: Secondary | ICD-10-CM

## 2015-06-19 DIAGNOSIS — F0390 Unspecified dementia without behavioral disturbance: Secondary | ICD-10-CM

## 2015-06-19 DIAGNOSIS — E43 Unspecified severe protein-calorie malnutrition: Secondary | ICD-10-CM | POA: Diagnosis not present

## 2015-06-19 DIAGNOSIS — R4182 Altered mental status, unspecified: Secondary | ICD-10-CM | POA: Diagnosis not present

## 2015-06-19 DIAGNOSIS — G8929 Other chronic pain: Secondary | ICD-10-CM | POA: Insufficient documentation

## 2015-06-19 DIAGNOSIS — M1990 Unspecified osteoarthritis, unspecified site: Secondary | ICD-10-CM | POA: Insufficient documentation

## 2015-06-19 DIAGNOSIS — K219 Gastro-esophageal reflux disease without esophagitis: Secondary | ICD-10-CM

## 2015-06-19 DIAGNOSIS — R509 Fever, unspecified: Secondary | ICD-10-CM | POA: Diagnosis not present

## 2015-06-19 DIAGNOSIS — R011 Cardiac murmur, unspecified: Secondary | ICD-10-CM

## 2015-06-19 DIAGNOSIS — F329 Major depressive disorder, single episode, unspecified: Secondary | ICD-10-CM

## 2015-06-19 DIAGNOSIS — F17210 Nicotine dependence, cigarettes, uncomplicated: Secondary | ICD-10-CM | POA: Insufficient documentation

## 2015-06-19 DIAGNOSIS — R739 Hyperglycemia, unspecified: Secondary | ICD-10-CM | POA: Diagnosis not present

## 2015-06-19 DIAGNOSIS — E109 Type 1 diabetes mellitus without complications: Secondary | ICD-10-CM | POA: Insufficient documentation

## 2015-06-19 DIAGNOSIS — E860 Dehydration: Secondary | ICD-10-CM | POA: Diagnosis not present

## 2015-06-19 DIAGNOSIS — M8580 Other specified disorders of bone density and structure, unspecified site: Secondary | ICD-10-CM | POA: Insufficient documentation

## 2015-06-19 DIAGNOSIS — K3184 Gastroparesis: Secondary | ICD-10-CM | POA: Diagnosis not present

## 2015-06-19 DIAGNOSIS — Z79899 Other long term (current) drug therapy: Secondary | ICD-10-CM | POA: Insufficient documentation

## 2015-06-19 DIAGNOSIS — R918 Other nonspecific abnormal finding of lung field: Secondary | ICD-10-CM | POA: Diagnosis not present

## 2015-06-19 DIAGNOSIS — R1110 Vomiting, unspecified: Secondary | ICD-10-CM | POA: Diagnosis not present

## 2015-06-19 LAB — CBC WITH DIFFERENTIAL/PLATELET
Basophils Absolute: 0 K/uL (ref 0.0–0.1)
Basophils Relative: 0 %
Eosinophils Absolute: 0 K/uL (ref 0.0–0.7)
Eosinophils Relative: 0 %
HCT: 31.3 % — ABNORMAL LOW (ref 36.0–46.0)
Hemoglobin: 10.5 g/dL — ABNORMAL LOW (ref 12.0–15.0)
Lymphocytes Relative: 14 %
Lymphs Abs: 1.7 K/uL (ref 0.7–4.0)
MCH: 28.6 pg (ref 26.0–34.0)
MCHC: 33.5 g/dL (ref 30.0–36.0)
MCV: 85.3 fL (ref 78.0–100.0)
Monocytes Absolute: 0.8 K/uL (ref 0.1–1.0)
Monocytes Relative: 7 %
Neutro Abs: 9.2 K/uL — ABNORMAL HIGH (ref 1.7–7.7)
Neutrophils Relative %: 79 %
Platelets: 302 K/uL (ref 150–400)
RBC: 3.67 MIL/uL — ABNORMAL LOW (ref 3.87–5.11)
RDW: 16.2 % — ABNORMAL HIGH (ref 11.5–15.5)
WBC: 11.7 K/uL — ABNORMAL HIGH (ref 4.0–10.5)

## 2015-06-19 LAB — I-STAT TROPONIN, ED: Troponin i, poc: 0 ng/mL (ref 0.00–0.08)

## 2015-06-19 LAB — URINALYSIS, ROUTINE W REFLEX MICROSCOPIC
Glucose, UA: >1000 mg/dL — AB
Hgb urine dipstick: NEGATIVE
Ketones, ur: 15 mg/dL — AB
Leukocytes, UA: NEGATIVE
Nitrite: NEGATIVE
Protein, ur: NEGATIVE mg/dL
Specific Gravity, Urine: 1.02 (ref 1.005–1.030)
pH: 7 (ref 5.0–8.0)

## 2015-06-19 LAB — COMPREHENSIVE METABOLIC PANEL WITH GFR
ALT: 58 U/L — ABNORMAL HIGH (ref 14–54)
AST: 54 U/L — ABNORMAL HIGH (ref 15–41)
Albumin: 4.1 g/dL (ref 3.5–5.0)
Alkaline Phosphatase: 106 U/L (ref 38–126)
Anion gap: 11 (ref 5–15)
BUN: 25 mg/dL — ABNORMAL HIGH (ref 6–20)
CO2: 29 mmol/L (ref 22–32)
Calcium: 10.5 mg/dL — ABNORMAL HIGH (ref 8.9–10.3)
Chloride: 103 mmol/L (ref 101–111)
Creatinine, Ser: 0.9 mg/dL (ref 0.44–1.00)
GFR calc Af Amer: >60 mL/min
GFR calc non Af Amer: >60 mL/min
Glucose, Bld: 113 mg/dL — ABNORMAL HIGH (ref 65–99)
Potassium: 3.9 mmol/L (ref 3.5–5.1)
Sodium: 143 mmol/L (ref 135–145)
Total Bilirubin: 0.5 mg/dL (ref 0.3–1.2)
Total Protein: 7 g/dL (ref 6.5–8.1)

## 2015-06-19 LAB — I-STAT CG4 LACTIC ACID, ED: Lactic Acid, Venous: 2.3 mmol/L (ref 0.5–2.0)

## 2015-06-19 LAB — CBG MONITORING, ED
Glucose-Capillary: 109 mg/dL — ABNORMAL HIGH (ref 65–99)
Glucose-Capillary: 120 mg/dL — ABNORMAL HIGH (ref 65–99)

## 2015-06-19 LAB — URINE MICROSCOPIC-ADD ON
RBC / HPF: NONE SEEN RBC/hpf (ref 0–5)
Squamous Epithelial / HPF: NONE SEEN

## 2015-06-19 LAB — ETHANOL: Alcohol, Ethyl (B): <5 mg/dL

## 2015-06-19 LAB — LIPASE, BLOOD: Lipase: 23 U/L (ref 11–51)

## 2015-06-19 LAB — CK: Total CK: 376 U/L — ABNORMAL HIGH (ref 38–234)

## 2015-06-19 MED ORDER — SODIUM CHLORIDE 0.9 % IV BOLUS (SEPSIS)
500.0000 mL | Freq: Once | INTRAVENOUS | Status: AC
  Administered 2015-06-19: 500 mL via INTRAVENOUS

## 2015-06-19 NOTE — ED Notes (Signed)
Per EMS, Pt from [facility], Pt started vomiting last night and had a fever today of 100.8. Pt hasn't eaten today. Per staff, pt has altered mental status. Pt usually A&Ox4, today she is only oriented to person.

## 2015-06-19 NOTE — ED Provider Notes (Signed)
CSN: [id number]     Arrival date & time 06/19/15  1941 History   First MD Initiated Contact with Patient 06/19/15 2000     Chief Complaint  Patient presents with  . Altered Mental Status     (Consider location/radiation/quality/duration/timing/severity/associated sxs/prior Treatment) HPI   Beth Payne is a 75 y.o F with a pmhx of type 1 DM, TIA, dementia, CAD who presents to the ED today from her nursing home for altered mental status and emesis. Level V caveat, AMS/dementia. Per nursing home, pt had multiple episodes of emesis beginning last night as well as today, NBNB. Nursing staff providers state that patient has not eaten anything today and patient appears to be "not herself". Nursing staff and family states that she "gets this way when her sugar is high". At baseline, pt is somewhat disoriented with history of dementia but is usually able to answer questions appropriately. Nursing home staff reports oral temp of 100.8 today. No reported diarrhea, melena, hematochezia, fall. Patient denies chest pain or difficulty breathing. Denies abdominal pain.  Past Medical History  Diagnosis Date  . Coronary artery disease   . Depression   . Retinopathy   . Anxiety   . Heart murmur   . TIA (transient ischemic attack)   . Arthritis     "fingers" (12/23/2014)  . Fibromyalgia   . Type I diabetes mellitus (HCC) dx'd 1953    "I'm on a pup" (12/23/2014)  . Chronic lower back pain   . GERD (gastroesophageal reflux disease) dx'd 1995  . Osteopenia dx'd 2007  . Dementia    Past Surgical History  Procedure Laterality Date  . Cataract extraction w/ intraocular lens  implant, bilateral Bilateral 1998  . Tonsillectomy    . Appendectomy    . Bilateral carpal tunnel release Bilateral 2003-2004  . Back surgery    . Anterior cervical decomp/discectomy fusion  05/2003    "C5-6"  . Abdominal hysterectomy  1972  . Dilation and curettage of uterus    . Cesarean section    . Eye surgery Bilateral  since 1977    "numerous slaser surgeries for retinopathy"  . Trigger finger release Bilateral 2003  . Cervical disc surgery  2004    "collapsed C-5"  . Fracture surgery    . Patella fracture surgery Left 04/2007  . Vertebroplasty  01/2008    "compressed lumbar fracture"  . Laparoscopic lysis of adhesions  1975-1987 X 5  . Exploratory laparotomy  4098-1191 X 1    "adhesions"  . Oophorectomy Bilateral 1980's  . Vitrectomy Right 08/1997  . Bunionectomy with hammertoe reconstruction Right 02/1999  . Coronary angioplasty with stent placement  07/1999    "?1"  . Lumbar microdiscectomy  03/2005  . Orif ankle fracture Left 12/25/2014    Procedure: OPEN REDUCTION INTERNAL FIXATION (ORIF) LEFT TRIMALLEOLAR ANKLE FRACTURE;  Surgeon: Tarry Kos, MD;  Location: [facility] OR;  Service: Orthopedics;  Laterality: Left;   Family History  Problem Relation Age of Onset  . Liver disease Sister   . Colon cancer Brother    Social History  Substance Use Topics  . Smoking status: Current Every Day Smoker -- 1.00 packs/day for 27 years    Types: Cigarettes  . Smokeless tobacco: Never Used     Comment: "quit smoking cigarettes in 1985  . Alcohol Use: No   OB History    No data available     Review of Systems  All other systems reviewed and are negative.  Allergies  Diclofenac sodium; Doxycycline calcium; Codeine; and Zocor  Home Medications   Prior to Admission medications   Medication Sig Start Date End Date Taking? Authorizing Provider  acetaminophen (TYLENOL) 325 MG tablet Take 2 tablets (650 mg total) by mouth every 6 (six) hours as needed for mild pain (or Fever >/= 101). 12/27/14  Yes Christiane Ha, MD  amLODipine (NORVASC) 10 MG tablet Take 1 tablet (10 mg total) by mouth daily. 05/22/15  Yes Richarda Overlie, MD  ARIPiprazole (ABILIFY) 5 MG tablet Take 1 tablet (5 mg total) by mouth every morning. 04/28/15  Yes Shanker Levora Dredge, MD  aspirin EC 81 MG tablet Take 1 tablet (81 mg total) by  mouth daily. 04/28/15  Yes Shanker Levora Dredge, MD  Cholecalciferol (VITAMIN D3) 2000 UNITS TABS Take 2,000 Units by mouth daily. For supplement   Yes Historical Provider, MD  donepezil (ARICEPT) 10 MG tablet Take 10 mg by mouth at bedtime.   Yes Historical Provider, MD  ferrous sulfate 325 (65 FE) MG tablet Take 325 mg by mouth 2 (two) times daily.   Yes Historical Provider, MD  insulin aspart (NOVOLOG) 100 UNIT/ML injection 0-9 Units, Subcutaneous, 3 times daily with meals CBG < 70: implement hypoglycemia protocol CBG 70 - 120: 0 units CBG 121 - 150: 1 unit CBG 151 - 200: 2 units CBG 201 - 250: 3 units CBG 251 - 300: 5 units CBG 301 - 350: 7 units CBG 351 - 400: 9 units CBG > 400: call MD 04/28/15  Yes Shanker Levora Dredge, MD  insulin detemir (LEVEMIR) 100 UNIT/ML injection Inject 4 Units into the skin 2 (two) times daily.   Yes Historical Provider, MD  insulin glargine (LANTUS) 100 UNIT/ML injection Inject 0.18 mLs (18 Units total) into the skin at bedtime. Patient taking differently: Inject 12 Units into the skin at bedtime.  04/28/15  Yes Shanker Levora Dredge, MD  lamoTRIgine (LAMICTAL) 100 MG tablet Take 50 mg by mouth at bedtime.    Yes Historical Provider, MD  LORazepam (ATIVAN) 0.5 MG tablet Take 1 tablet (0.5 mg total) by mouth at bedtime. Anxiety 05/22/15  Yes Richarda Overlie, MD  Multiple Vitamin (MULITIVITAMIN WITH MINERALS) TABS Take 1 tablet by mouth daily.   Yes Historical Provider, MD  Nutritional Supplements (NUTRITIONAL SUPPLEMENT PO) Take 1 Can by mouth 2 (two) times daily. Med Pass   Yes Historical Provider, MD  OVER THE COUNTER MEDICATION Take 1 capsule by mouth daily. Decubivite   Yes Historical Provider, MD  pantoprazole (PROTONIX) 40 MG tablet Take 1 tablet (40 mg total) by mouth 2 (two) times daily. 04/28/15  Yes Shanker Levora Dredge, MD  polyethylene glycol (MIRALAX / GLYCOLAX) packet Take 17 g by mouth daily as needed for mild constipation. Patient taking differently: Take 17 g  by mouth daily as needed for mild constipation or moderate constipation.  12/27/14  Yes Christiane Ha, MD  prazosin (MINIPRESS) 1 MG capsule Take 1 mg by mouth at bedtime.    Yes Historical Provider, MD  rosuvastatin (CRESTOR) 5 MG tablet Take 5 mg by mouth daily.   Yes Historical Provider, MD  sennosides-docusate sodium (SENOKOT-S) 8.6-50 MG tablet Take 2 tablets by mouth 2 (two) times daily.   Yes Historical Provider, MD  sertraline (ZOLOFT) 100 MG tablet Take 150 mg by mouth daily.   Yes Historical Provider, MD  feeding supplement, GLUCERNA SHAKE, (GLUCERNA SHAKE) LIQD Take 237 mLs by mouth 3 (three) times daily between meals. Patient not taking:  Reported on 06/19/2015 04/28/15   Maretta Bees, MD   BP 151/64 mmHg  Pulse 89  Temp(Src) 100.3 F (37.9 C) (Rectal)  Resp 24  SpO2 97% Physical Exam  Constitutional: She appears well-developed. No distress.  Thin, frail appearing woman lying in bed.  HENT:  Head: Normocephalic and atraumatic.  Mouth/Throat: Oropharynx is clear and moist. No oropharyngeal exudate.  Eyes: Conjunctivae and EOM are normal. Pupils are equal, round, and reactive to light. Right eye exhibits no discharge. Left eye exhibits no discharge. No scleral icterus.  Neck: Normal range of motion. Neck supple. No JVD present.  Cardiovascular: Normal rate, regular rhythm, normal heart sounds and intact distal pulses.  Exam reveals no gallop and no friction rub.   No murmur heard. Pulmonary/Chest: Effort normal and breath sounds normal. No respiratory distress. She has no wheezes. She has no rales. She exhibits no tenderness.  Abdominal: Soft. Bowel sounds are normal. She exhibits no distension and no mass. There is no tenderness. There is no rebound and no guarding.  No peritoneal signs.  Musculoskeletal: Normal range of motion. She exhibits no edema.  Lymphadenopathy:    She has no cervical adenopathy.  Neurological: She is alert. No cranial nerve deficit.  Pt  oriented to self. Able to answer 'yes and no' questions. Not oriented to location or time.  Strength 4/5 throughout, suspect deconditioning as cause. No sensory deficits.  Normal finger to nose.   Skin: Skin is warm and dry. No rash noted. She is not diaphoretic. No erythema. No pallor.  Psychiatric: She has a normal mood and affect.  Nursing note and vitals reviewed.   ED Course  Procedures (including critical care time) Labs Review Labs Reviewed  COMPREHENSIVE METABOLIC PANEL - Abnormal; Notable for the following:    Glucose, Bld 113 (*)    BUN 25 (*)    Calcium 10.5 (*)    AST 54 (*)    ALT 58 (*)    All other components within normal limits  CBC WITH DIFFERENTIAL/PLATELET - Abnormal; Notable for the following:    WBC 11.7 (*)    RBC 3.67 (*)    Hemoglobin 10.5 (*)    HCT 31.3 (*)    RDW 16.2 (*)    Neutro Abs 9.2 (*)    All other components within normal limits  CBG MONITORING, ED - Abnormal; Notable for the following:    Glucose-Capillary 109 (*)    All other components within normal limits  I-STAT CG4 LACTIC ACID, ED - Abnormal; Notable for the following:    Lactic Acid, Venous 2.30 (*)    All other components within normal limits  LIPASE, BLOOD  ETHANOL  URINALYSIS, ROUTINE W REFLEX MICROSCOPIC (NOT AT [facility])  CK  I-STAT TROPOININ, ED    Imaging Review Ct Head Wo Contrast  06/19/2015  CLINICAL DATA:  Fever today. Vomiting beginning last night. Altered mental status. EXAM: CT HEAD WITHOUT CONTRAST TECHNIQUE: Contiguous axial images were obtained from the base of the skull through the vertex without intravenous contrast. COMPARISON:  Brain MRI 05/20/2015. FINDINGS: There is cortical atrophy and chronic microvascular ischemic change. No evidence of acute abnormality including infarct, hemorrhage, mass lesion, mass effect, midline shift or abnormal extra-axial fluid collections identified. No hydrocephalus or pneumocephalus. The calvarium is intact. IMPRESSION: No acute  abnormality. Atrophy and chronic microvascular ischemic change. Electronically Signed   By: Drusilla Kanner M.D.   On: 06/19/2015 20:59   I have personally reviewed and evaluated these images and  lab results as part of my medical decision-making.   EKG Interpretation   Date/Time:  Thursday June 19 2015 20:46:13 EST Ventricular Rate:  91 PR Interval:  160 QRS Duration: 97 QT Interval:  364 QTC Calculation: 448 R Axis:   24 Text Interpretation:  Sinus rhythm Borderline low voltage, extremity leads  Artifact  limiting interpretation without acute change with limitation  Confirmed by NGUYEN, EMILY ([id number]) on 06/19/2015 9:40:19 PM      MDM   Final diagnoses:  Non-intractable vomiting with nausea, vomiting of unspecified type  Type 1 diabetes mellitus with complication [facility](HCC)  Confusion    75 y.o F with a pmhx of type 1 DM, dementia who presents to the ED from nursing home for N/V and elevated blood glucose. I spoke with the nursing home staff who states that patient was refusing to eat all day and began to vomit. They checked her blood sugar several times for the day and it remained elevated despite administering multiple doses of insulin. Nursing home staff reports that patient was "not acting herself" and appeared more disoriented than normal. Patient was sent to the emergency department for persistent elevated blood glucose. On presentation in the emergency department, patient is afebrile, in NAD. No active vomiting. Abdomen is soft and nontender. Patient is oriented to self and able to answer yes and no questions. However, she is disoriented to location and time. Initial blood glucose in the ED is 109. No signs of DKA. No anion gap. CO2 29. Patient has elevated lactic acid at 2.3. Fluid bolus given. When compared to previous exams patient has had lactic acid elevations of 2.3 previously. No electrolyte abnormalities. Nonspecific leukocytosis at 11.7. UA negative for infection. Chest  x-ray negative for pneumonia. CT head obtained, negative for acute abnormality. Repeat blood glucose is 120. Pt is eating in the ED, requesting food. No vomiting. Spoke with family at bedside with Dr. Cyndie ChimeNguyen and discussed that overall pts blood work appears at her baseline. Blood glucose in the ED has not been elevated. Do not feel that pt requires admission at this time. Pt will be safely discharged home to nursing home with PCP follow up. STRICT return precautions given. Pt and family agreeable to treatment plan. Pt is hemodynamically stable and ready for discharge.     Lester KinsmanSamantha Tripp DouglassDowless, PA-C 06/23/15 [phone]  Leta BaptistEmily Roe Nguyen, MD 06/27/15 [phone]

## 2015-06-19 NOTE — ED Notes (Signed)
Patient transported to X-ray 

## 2015-06-19 NOTE — ED Notes (Signed)
Pt transported to CT ?

## 2015-06-20 ENCOUNTER — Emergency Department (HOSPITAL_COMMUNITY): Payer: Medicare Other

## 2015-06-20 ENCOUNTER — Inpatient Hospital Stay (HOSPITAL_COMMUNITY)
Admission: EM | Admit: 2015-06-20 | Discharge: 2015-06-27 | DRG: 637 | Disposition: A | Discharge status: Skilled Nursing Facility | Payer: Medicare Other | Attending: Internal Medicine | Admitting: Internal Medicine

## 2015-06-20 ENCOUNTER — Encounter (HOSPITAL_COMMUNITY): Payer: Self-pay | Admitting: Emergency Medicine

## 2015-06-20 DIAGNOSIS — M8580 Other specified disorders of bone density and structure, unspecified site: Secondary | ICD-10-CM | POA: Diagnosis present

## 2015-06-20 DIAGNOSIS — Z66 Do not resuscitate: Secondary | ICD-10-CM | POA: Diagnosis not present

## 2015-06-20 DIAGNOSIS — E43 Unspecified severe protein-calorie malnutrition: Secondary | ICD-10-CM | POA: Diagnosis present

## 2015-06-20 DIAGNOSIS — F0390 Unspecified dementia without behavioral disturbance: Secondary | ICD-10-CM | POA: Diagnosis present

## 2015-06-20 DIAGNOSIS — R911 Solitary pulmonary nodule: Secondary | ICD-10-CM | POA: Diagnosis present

## 2015-06-20 DIAGNOSIS — G8929 Other chronic pain: Secondary | ICD-10-CM | POA: Diagnosis present

## 2015-06-20 DIAGNOSIS — K219 Gastro-esophageal reflux disease without esophagitis: Secondary | ICD-10-CM | POA: Diagnosis present

## 2015-06-20 DIAGNOSIS — Z886 Allergy status to analgesic agent status: Secondary | ICD-10-CM

## 2015-06-20 DIAGNOSIS — E1110 Type 2 diabetes mellitus with ketoacidosis without coma: Secondary | ICD-10-CM | POA: Diagnosis present

## 2015-06-20 DIAGNOSIS — R1110 Vomiting, unspecified: Secondary | ICD-10-CM | POA: Diagnosis not present

## 2015-06-20 DIAGNOSIS — Z955 Presence of coronary angioplasty implant and graft: Secondary | ICD-10-CM

## 2015-06-20 DIAGNOSIS — E876 Hypokalemia: Secondary | ICD-10-CM | POA: Diagnosis present

## 2015-06-20 DIAGNOSIS — Z9842 Cataract extraction status, left eye: Secondary | ICD-10-CM

## 2015-06-20 DIAGNOSIS — M545 Low back pain: Secondary | ICD-10-CM | POA: Diagnosis present

## 2015-06-20 DIAGNOSIS — Z794 Long term (current) use of insulin: Secondary | ICD-10-CM

## 2015-06-20 DIAGNOSIS — D649 Anemia, unspecified: Secondary | ICD-10-CM | POA: Diagnosis present

## 2015-06-20 DIAGNOSIS — E860 Dehydration: Secondary | ICD-10-CM | POA: Diagnosis present

## 2015-06-20 DIAGNOSIS — E1010 Type 1 diabetes mellitus with ketoacidosis without coma: Secondary | ICD-10-CM | POA: Diagnosis not present

## 2015-06-20 DIAGNOSIS — K5900 Constipation, unspecified: Secondary | ICD-10-CM | POA: Diagnosis present

## 2015-06-20 DIAGNOSIS — N179 Acute kidney failure, unspecified: Secondary | ICD-10-CM | POA: Diagnosis present

## 2015-06-20 DIAGNOSIS — E1043 Type 1 diabetes mellitus with diabetic autonomic (poly)neuropathy: Secondary | ICD-10-CM | POA: Diagnosis present

## 2015-06-20 DIAGNOSIS — R7309 Other abnormal glucose: Secondary | ICD-10-CM | POA: Diagnosis not present

## 2015-06-20 DIAGNOSIS — F419 Anxiety disorder, unspecified: Secondary | ICD-10-CM | POA: Diagnosis present

## 2015-06-20 DIAGNOSIS — L8990 Pressure ulcer of unspecified site, unspecified stage: Secondary | ICD-10-CM | POA: Insufficient documentation

## 2015-06-20 DIAGNOSIS — E10319 Type 1 diabetes mellitus with unspecified diabetic retinopathy without macular edema: Secondary | ICD-10-CM | POA: Diagnosis present

## 2015-06-20 DIAGNOSIS — Z961 Presence of intraocular lens: Secondary | ICD-10-CM | POA: Diagnosis present

## 2015-06-20 DIAGNOSIS — R918 Other nonspecific abnormal finding of lung field: Secondary | ICD-10-CM | POA: Diagnosis not present

## 2015-06-20 DIAGNOSIS — R739 Hyperglycemia, unspecified: Secondary | ICD-10-CM | POA: Diagnosis not present

## 2015-06-20 DIAGNOSIS — Z8673 Personal history of transient ischemic attack (TIA), and cerebral infarction without residual deficits: Secondary | ICD-10-CM

## 2015-06-20 DIAGNOSIS — M797 Fibromyalgia: Secondary | ICD-10-CM | POA: Diagnosis present

## 2015-06-20 DIAGNOSIS — Z9841 Cataract extraction status, right eye: Secondary | ICD-10-CM

## 2015-06-20 DIAGNOSIS — I2510 Atherosclerotic heart disease of native coronary artery without angina pectoris: Secondary | ICD-10-CM | POA: Diagnosis present

## 2015-06-20 DIAGNOSIS — Z87891 Personal history of nicotine dependence: Secondary | ICD-10-CM

## 2015-06-20 DIAGNOSIS — K3184 Gastroparesis: Secondary | ICD-10-CM | POA: Diagnosis present

## 2015-06-20 DIAGNOSIS — E1051 Type 1 diabetes mellitus with diabetic peripheral angiopathy without gangrene: Secondary | ICD-10-CM | POA: Diagnosis present

## 2015-06-20 DIAGNOSIS — E785 Hyperlipidemia, unspecified: Secondary | ICD-10-CM | POA: Diagnosis present

## 2015-06-20 DIAGNOSIS — R627 Adult failure to thrive: Secondary | ICD-10-CM | POA: Diagnosis present

## 2015-06-20 DIAGNOSIS — Z981 Arthrodesis status: Secondary | ICD-10-CM

## 2015-06-20 DIAGNOSIS — Z7982 Long term (current) use of aspirin: Secondary | ICD-10-CM

## 2015-06-20 DIAGNOSIS — R4182 Altered mental status, unspecified: Secondary | ICD-10-CM | POA: Diagnosis not present

## 2015-06-20 DIAGNOSIS — F329 Major depressive disorder, single episode, unspecified: Secondary | ICD-10-CM | POA: Diagnosis present

## 2015-06-20 DIAGNOSIS — Z888 Allergy status to other drugs, medicaments and biological substances status: Secondary | ICD-10-CM

## 2015-06-20 DIAGNOSIS — A084 Viral intestinal infection, unspecified: Secondary | ICD-10-CM | POA: Diagnosis present

## 2015-06-20 LAB — CBC WITH DIFFERENTIAL/PLATELET
BASOS ABS: 0 10*3/uL (ref 0.0–0.1)
BASOS PCT: 0 %
EOS PCT: 0 %
Eosinophils Absolute: 0 10*3/uL (ref 0.0–0.7)
HEMATOCRIT: 32.4 % — AB (ref 36.0–46.0)
Hemoglobin: 10.8 g/dL — ABNORMAL LOW (ref 12.0–15.0)
Lymphocytes Relative: 8 %
Lymphs Abs: 1 10*3/uL (ref 0.7–4.0)
MCH: 28.1 pg (ref 26.0–34.0)
MCHC: 33.3 g/dL (ref 30.0–36.0)
MCV: 84.4 fL (ref 78.0–100.0)
MONO ABS: 0.9 10*3/uL (ref 0.1–1.0)
MONOS PCT: 6 %
Neutro Abs: 11.6 10*3/uL — ABNORMAL HIGH (ref 1.7–7.7)
Neutrophils Relative %: 86 %
PLATELETS: 309 10*3/uL (ref 150–400)
RBC: 3.84 MIL/uL — ABNORMAL LOW (ref 3.87–5.11)
RDW: 16.9 % — AB (ref 11.5–15.5)
WBC: 13.5 10*3/uL — ABNORMAL HIGH (ref 4.0–10.5)

## 2015-06-20 LAB — COMPREHENSIVE METABOLIC PANEL
ALBUMIN: 3.8 g/dL (ref 3.5–5.0)
ALK PHOS: 102 U/L (ref 38–126)
ALT: 57 U/L — AB (ref 14–54)
AST: 56 U/L — AB (ref 15–41)
Anion gap: 22 — ABNORMAL HIGH (ref 5–15)
BILIRUBIN TOTAL: 1.2 mg/dL (ref 0.3–1.2)
BUN: 18 mg/dL (ref 6–20)
CALCIUM: 10.1 mg/dL (ref 8.9–10.3)
CO2: 16 mmol/L — AB (ref 22–32)
Chloride: 104 mmol/L (ref 101–111)
Creatinine, Ser: 1.27 mg/dL — ABNORMAL HIGH (ref 0.44–1.00)
GFR calc Af Amer: 47 mL/min — ABNORMAL LOW (ref >60)
GFR calc non Af Amer: 41 mL/min — ABNORMAL LOW (ref >60)
GLUCOSE: 282 mg/dL — AB (ref 65–99)
Potassium: 3.3 mmol/L — ABNORMAL LOW (ref 3.5–5.1)
SODIUM: 142 mmol/L (ref 135–145)
TOTAL PROTEIN: 6.5 g/dL (ref 6.5–8.1)

## 2015-06-20 LAB — POC OCCULT BLOOD, ED: Fecal Occult Bld: POSITIVE — AB

## 2015-06-20 LAB — CBG MONITORING, ED: Glucose-Capillary: 315 mg/dL — ABNORMAL HIGH (ref 65–99)

## 2015-06-20 MED ORDER — SODIUM CHLORIDE 0.9 % IV SOLN
INTRAVENOUS | Status: DC
  Administered 2015-06-21: 2.2 [IU]/h via INTRAVENOUS
  Filled 2015-06-20: qty 2.5

## 2015-06-20 MED ORDER — ONDANSETRON HCL 4 MG/2ML IJ SOLN
4.0000 mg | Freq: Once | INTRAMUSCULAR | Status: AC
  Administered 2015-06-20: 4 mg via INTRAVENOUS
  Filled 2015-06-20: qty 2

## 2015-06-20 MED ORDER — PANTOPRAZOLE SODIUM 40 MG IV SOLR
40.0000 mg | Freq: Once | INTRAVENOUS | Status: AC
  Administered 2015-06-20: 40 mg via INTRAVENOUS
  Filled 2015-06-20: qty 40

## 2015-06-20 MED ORDER — IOHEXOL 300 MG/ML  SOLN
80.0000 mL | Freq: Once | INTRAMUSCULAR | Status: AC | PRN
  Administered 2015-06-20: 100 mL via INTRAVENOUS

## 2015-06-20 NOTE — ED Provider Notes (Signed)
CSN: [id number]     Arrival date & time 06/20/15  2011 History   First MD Initiated Contact with Patient 06/20/15 2014     Chief Complaint  Patient presents with  . Hyperglycemia  . Altered Mental Status     (Consider location/radiation/quality/duration/timing/severity/associated sxs/prior Treatment) HPI LEVEL V caveat- dementia  Patient has a PMH multiple medical problems, including coronary artery disease, TIA, fibromyalgia, type 1 diabetes, GERD, chronic low back pain and dementia. She was seen in the ER yesterday for altered mental status and a temperature up 100.8 and a CT of the head, chest x-ray, urinalysis and comprehensive laboratory workup done in the ER and was discharged early this morning back to [facility]. Per EMS [facility] called out complaining of hyperglycemia and vomiting. The patient states that she has been vomiting all day. Her glucose by EMS was noted to be in the 400s. The patient states that she has been feeling bad and is having abdominal pain. She has notably a dark substance on her tongue. Today she is afebrile with a pulse of 107, respirations 24 blood pressure 138/52.  Patient has a MOST form with care guidelines, she agrees to receive IV fluids during todays encounter.  Past Medical History  Diagnosis Date  . Coronary artery disease   . Depression   . Retinopathy   . Anxiety   . Heart murmur   . TIA (transient ischemic attack)   . Arthritis     "fingers" (12/23/2014)  . Fibromyalgia   . Type I diabetes mellitus (HCC) dx'd 1953    "I'm on a pup" (12/23/2014)  . Chronic lower back pain   . GERD (gastroesophageal reflux disease) dx'd 1995  . Osteopenia dx'd 2007  . Dementia    Past Surgical History  Procedure Laterality Date  . Cataract extraction w/ intraocular lens  implant, bilateral Bilateral 1998  . Tonsillectomy    . Appendectomy    . Bilateral carpal tunnel release Bilateral 2003-2004  . Back surgery    . Anterior cervical  decomp/discectomy fusion  05/2003    "C5-6"  . Abdominal hysterectomy  1972  . Dilation and curettage of uterus    . Cesarean section    . Eye surgery Bilateral since 1977    "numerous slaser surgeries for retinopathy"  . Trigger finger release Bilateral 2003  . Cervical disc surgery  2004    "collapsed C-5"  . Fracture surgery    . Patella fracture surgery Left 04/2007  . Vertebroplasty  01/2008    "compressed lumbar fracture"  . Laparoscopic lysis of adhesions  1975-1987 X 5  . Exploratory laparotomy  4098-1191 X 1    "adhesions"  . Oophorectomy Bilateral 1980's  . Vitrectomy Right 08/1997  . Bunionectomy with hammertoe reconstruction Right 02/1999  . Coronary angioplasty with stent placement  07/1999    "?1"  . Lumbar microdiscectomy  03/2005  . Orif ankle fracture Left 12/25/2014    Procedure: OPEN REDUCTION INTERNAL FIXATION (ORIF) LEFT TRIMALLEOLAR ANKLE FRACTURE;  Surgeon: Tarry Kos, MD;  Location: [facility] OR;  Service: Orthopedics;  Laterality: Left;   Family History  Problem Relation Age of Onset  . Liver disease Sister   . Colon cancer Brother    Social History  Substance Use Topics  . Smoking status: Former Smoker -- 1.00 packs/day for 27 years    Types: Cigarettes  . Smokeless tobacco: Never Used     Comment: "quit smoking cigarettes in 1985  . Alcohol Use: No  OB History    No data available     Review of Systems  LEVEL V caveat- dementia  Allergies  Diclofenac sodium; Doxycycline calcium; Codeine; and Zocor  Home Medications   Prior to Admission medications   Medication Sig Start Date End Date Taking? Authorizing Provider  acetaminophen (TYLENOL) 325 MG tablet Take 2 tablets (650 mg total) by mouth every 6 (six) hours as needed for mild pain (or Fever >/= 101). 12/27/14  Yes Christiane Ha, MD  amLODipine (NORVASC) 10 MG tablet Take 1 tablet (10 mg total) by mouth daily. 05/22/15  Yes Richarda Overlie, MD  ARIPiprazole (ABILIFY) 5 MG tablet Take 1 tablet  (5 mg total) by mouth every morning. 04/28/15  Yes Shanker Levora Dredge, MD  aspirin EC 81 MG tablet Take 1 tablet (81 mg total) by mouth daily. 04/28/15  Yes Shanker Levora Dredge, MD  Cholecalciferol (VITAMIN D3) 2000 UNITS TABS Take 2,000 Units by mouth daily. For supplement   Yes Historical Provider, MD  donepezil (ARICEPT) 10 MG tablet Take 10 mg by mouth at bedtime.   Yes Historical Provider, MD  ferrous sulfate 325 (65 FE) MG tablet Take 325 mg by mouth 2 (two) times daily.   Yes Historical Provider, MD  insulin aspart (NOVOLOG) 100 UNIT/ML injection 0-9 Units, Subcutaneous, 3 times daily with meals CBG < 70: implement hypoglycemia protocol CBG 70 - 120: 0 units CBG 121 - 150: 1 unit CBG 151 - 200: 2 units CBG 201 - 250: 3 units CBG 251 - 300: 5 units CBG 301 - 350: 7 units CBG 351 - 400: 9 units CBG > 400: call MD 04/28/15  Yes Shanker Levora Dredge, MD  insulin detemir (LEVEMIR) 100 UNIT/ML injection Inject 6 Units into the skin 2 (two) times daily.    Yes Historical Provider, MD  lamoTRIgine (LAMICTAL) 100 MG tablet Take 50 mg by mouth at bedtime.    Yes Historical Provider, MD  LORazepam (ATIVAN) 0.5 MG tablet Take 1 tablet (0.5 mg total) by mouth at bedtime. Anxiety 05/22/15  Yes Richarda Overlie, MD  Multiple Vitamin (MULITIVITAMIN WITH MINERALS) TABS Take 1 tablet by mouth daily.   Yes Historical Provider, MD  Nutritional Supplements (NUTRITIONAL SUPPLEMENT PO) Take 1 Can by mouth 2 (two) times daily. Med Pass   Yes Historical Provider, MD  OVER THE COUNTER MEDICATION Take 1 capsule by mouth daily. Decubivite   Yes Historical Provider, MD  pantoprazole (PROTONIX) 40 MG tablet Take 1 tablet (40 mg total) by mouth 2 (two) times daily. 04/28/15  Yes Shanker Levora Dredge, MD  prazosin (MINIPRESS) 1 MG capsule Take 1 mg by mouth at bedtime.    Yes Historical Provider, MD  rosuvastatin (CRESTOR) 5 MG tablet Take 5 mg by mouth daily.   Yes Historical Provider, MD  sertraline (ZOLOFT) 100 MG tablet Take  150 mg by mouth daily.   Yes Historical Provider, MD  traMADol (ULTRAM) 50 MG tablet Take 50 mg by mouth every 6 (six) hours as needed for moderate pain.   Yes Historical Provider, MD  feeding supplement, GLUCERNA SHAKE, (GLUCERNA SHAKE) LIQD Take 237 mLs by mouth 3 (three) times daily between meals. Patient not taking: Reported on 06/19/2015 04/28/15   Maretta Bees, MD  insulin glargine (LANTUS) 100 UNIT/ML injection Inject 0.18 mLs (18 Units total) into the skin at bedtime. Patient not taking: Reported on 06/20/2015 04/28/15   Maretta Bees, MD  polyethylene glycol [facility] / Ethelene Hal) packet Take 17 g by mouth daily  as needed for mild constipation. Patient taking differently: Take 17 g by mouth daily as needed for mild constipation or moderate constipation.  12/27/14   Christiane Haorinna L Sullivan, MD  sennosides-docusate sodium (SENOKOT-S) 8.6-50 MG tablet Take 2 tablets by mouth 2 (two) times daily.    Historical Provider, MD   BP 168/64 mmHg  Pulse 109  Temp(Src) 97.6 F (36.4 C) (Oral)  Resp 24  SpO2 100% Physical Exam  Constitutional: She appears well-developed and well-nourished. No distress.  HENT:  Head: Normocephalic and atraumatic.  Mouth/Throat: Mucous membranes are dry.  Tongue shows dark substance adhered to tongue, unclear if this is blood or fecal matter.  Eyes: Conjunctivae and EOM are normal. Pupils are equal, round, and reactive to light. No scleral icterus.  Neck: Normal range of motion. Neck supple.  Cardiovascular: Normal rate, regular rhythm, normal heart sounds and intact distal pulses.   Pulmonary/Chest: Effort normal and breath sounds normal. No respiratory distress. She has no wheezes. She has no rales. She exhibits no tenderness.  Abdominal: Soft. Bowel sounds are normal. She exhibits no distension. There is tenderness (diffuse, vomiting dark substance. no BRB). There is no rigidity, no rebound, no guarding and no CVA tenderness.  Musculoskeletal: She exhibits no  edema or tenderness.  Neurological: She is alert. She displays a negative Romberg sign.  No facial paralysis or slurring of speech, patient is alert and oriented to self. Unable to asses cranial nerves d/t pts inability to follow commands.    Skin: Skin is warm and dry. No petechiae, no purpura and no rash noted. She is not diaphoretic.  Psychiatric: Her speech is not slurred. Cognition and memory are impaired.  Nursing note and vitals reviewed.   ED Course  Procedures (including critical care time) Labs Review Labs Reviewed  COMPREHENSIVE METABOLIC PANEL - Abnormal; Notable for the following:    Potassium 3.3 (*)    CO2 16 (*)    Glucose, Bld 282 (*)    Creatinine, Ser 1.27 (*)    AST 56 (*)    ALT 57 (*)    GFR calc non Af Amer 41 (*)    GFR calc Af Amer 47 (*)    Anion gap 22 (*)    All other components within normal limits  CBC WITH DIFFERENTIAL/PLATELET - Abnormal; Notable for the following:    WBC 13.5 (*)    RBC 3.84 (*)    Hemoglobin 10.8 (*)    HCT 32.4 (*)    RDW 16.9 (*)    Neutro Abs 11.6 (*)    All other components within normal limits  CBG MONITORING, ED - Abnormal; Notable for the following:    Glucose-Capillary 315 (*)    All other components within normal limits  POC OCCULT BLOOD, ED - Abnormal; Notable for the following:    Fecal Occult Bld POSITIVE (*)    All other components within normal limits  CBG MONITORING, ED - Abnormal; Notable for the following:    Glucose-Capillary 280 (*)    All other components within normal limits  OCCULT BLOOD X 1 CARD TO LAB, STOOL    Imaging Review Dg Chest 2 View  06/20/2015  CLINICAL DATA:  Altered mental status with fever and vomiting for 1 day. EXAM: CHEST  2 VIEW COMPARISON:  Radiographs and CT 05/20/2015 FINDINGS: Lung volumes are low. Unchanged cardiomediastinal contours with mild cardiomegaly. Coronary stent in place. Linear atelectasis or scarring in the lingula. No consolidation to suggest pneumonia. No  pleural effusion  or pneumothorax. Left rib fractures on prior exam are partially seen. Kyphoplasty in the thoracolumbar spine. IMPRESSION: Linear atelectasis or scarring in the lingula. No acute process or change from prior. Electronically Signed   By: Rubye Oaks M.D.   On: 06/20/2015 00:33   Ct Head Wo Contrast  06/19/2015  CLINICAL DATA:  Fever today. Vomiting beginning last night. Altered mental status. EXAM: CT HEAD WITHOUT CONTRAST TECHNIQUE: Contiguous axial images were obtained from the base of the skull through the vertex without intravenous contrast. COMPARISON:  Brain MRI 05/20/2015. FINDINGS: There is cortical atrophy and chronic microvascular ischemic change. No evidence of acute abnormality including infarct, hemorrhage, mass lesion, mass effect, midline shift or abnormal extra-axial fluid collections identified. No hydrocephalus or pneumocephalus. The calvarium is intact. IMPRESSION: No acute abnormality. Atrophy and chronic microvascular ischemic change. Electronically Signed   By: Drusilla Kanner M.D.   On: 06/19/2015 20:59   I have personally reviewed and evaluated these images and lab results as part of my medical decision-making.   EKG Interpretation None      MDM   Final diagnoses:  Vomiting  Diabetic ketoacidosis without coma associated with type 1 diabetes mellitus (HCC)   9:07 pm Discussed case with Dr. Clayborne Dana, She is hemoccult positive to upper GI. Will scan chest and abdomen with contrast wo oral contrast. Glucose has improved to 315 with fluids.  Patient is in DKA, glucostabilizer started.  [facility] inpatient[facility] Admits to Dr. Julian Reil.  Filed Vitals:   06/20/15 2300 06/21/15 0014  BP: 150/67 168/64  Pulse: 97 109  Temp:    Resp:  [address] PA-C 06/21/15 0046  Marily Memos, MD 06/23/15 [phone]

## 2015-06-20 NOTE — Discharge Instructions (Signed)
Confusion Confusion is the inability to think with your usual speed or clarity. Confusion may come on quickly or slowly over time. How quickly the confusion comes on depends on the cause. Confusion can be due to any number of causes. CAUSES   Concussion, head injury, or head trauma.  Seizures.  Stroke.  Fever.  Brain tumor.  Age related decreased brain function (dementia).  Heightened emotional states like rage or terror.  Mental illness in which the person loses the ability to determine what is real and what is not (hallucinations).  Infections such as a urinary tract infection (UTI).  Toxic effects from alcohol, drugs, or prescription medicines.  Dehydration and an imbalance of salts in the body (electrolytes).  Lack of sleep.  Low blood sugar (diabetes).  Low levels of oxygen from conditions such as chronic lung disorders.  Drug interactions or other medicine side effects.  Nutritional deficiencies, especially niacin, thiamine, vitamin C, or vitamin B.  Sudden drop in body temperature (hypothermia).  Change in routine, such as when traveling or hospitalized. SIGNS AND SYMPTOMS  People often describe their thinking as cloudy or unclear when they are confused. Confusion can also include feeling disoriented. That means you are unaware of where or who you are. You may also not know what the date or time is. If confused, you may also have difficulty paying attention, remembering, and making decisions. Some people also act aggressively when they are confused.  DIAGNOSIS  The medical evaluation of confusion may include:  Blood and urine tests.  X-rays.  Brain and nervous system tests.  Analyzing your brain waves (electroencephalogram or EEG).  Magnetic resonance imaging (MRI) of your head.  Computed tomography (CT) scan of your head.  Mental status tests in which your health care provider may ask many questions. Some of these questions may seem silly or strange,  but they are a very important test to help diagnose and treat confusion. TREATMENT  An admission to the hospital may not be needed, but a person with confusion should not be left alone. Stay with a family member or friend until the confusion clears. Avoid alcohol, pain relievers, or sedative drugs until you have fully recovered. Do not drive until directed by your health care provider. HOME CARE INSTRUCTIONS  What family and friends can do:  To find out if someone is confused, ask the person to state his or her name, age, and the date. If the person is unsure or answers incorrectly, he or she is confused.  Always introduce yourself, no matter how well the person knows you.  Often remind the person of his or her location.  Place a calendar and clock near the confused person.  Help the person with his or her medicines. You may want to use a pill box, an alarm as a reminder, or give the person each dose as prescribed.  Talk about current events and plans for the day.  Try to keep the environment calm, quiet, and peaceful.  Make sure the person keeps follow-up visits with his or her health care provider. PREVENTION  Ways to prevent confusion:  Avoid alcohol.  Eat a balanced diet.  Get enough sleep.  Take medicine only as directed by your health care provider.  Do not become isolated. Spend time with other people and make plans for your days.  Keep careful watch on your blood sugar levels if you are diabetic. SEEK IMMEDIATE MEDICAL CARE IF:   You develop severe headaches, repeated vomiting, seizures, blackouts, or  slurred speech.  There is increasing confusion, weakness, numbness, restlessness, or personality changes.  You develop a loss of balance, have marked dizziness, feel uncoordinated, or fall.  You have delusions, hallucinations, or develop severe anxiety.  Your family members think you need to be rechecked.   This information is not intended to replace advice given  to you by your health care provider. Make sure you discuss any questions you have with your health care provider.   Document Released: 07/01/2004 Document Revised: 06/14/2014 Document Reviewed: 06/29/2013 Elsevier Interactive Patient Education 2016 [employer]  Your health care provider has decided you need to take insulin regularly. You have been given a correction scale (also called a sliding scale) in case you need extra insulin when your blood sugar is too high (hyperglycemia). The following instructions will assist you in how to use that correction scale.  WHAT IS A CORRECTION SCALE?  When you check your blood sugar, sometimes it will be higher than your health care provider has told you it should be. You may need an extra dose of insulin to bring your blood sugar to the recommended level (also known as your goal, target, or normal level). The correction scale is prescribed by your health care provider based on your specific needs.  Your correction scale has two parts:   The first shows you a blood sugar range.   The second part tells you how much extra insulin to give yourself if your blood sugar falls within this range. You will not need an extra dose of insulin if your blood glucose is in the desired range. You should simply give yourself the normal amount of insulin that your health care provider has ordered for you.  WHY IS IT IMPORTANT TO KEEP YOUR BLOOD SUGAR LEVELS AT YOUR DESIRED LEVEL?  Keeping your blood sugar at the desired level helps to prevent long-term complications of diabetes, such as eye disease, kidney failure, nerve damage, and other serious complications. WHAT TYPE OF INSULIN WILL YOU USE?  To help bring down blood sugar levels that are too high, your health care provider will prescribe a short-acting or a rapid-acting insulin. An example of a short-acting insulin would be regular insulin. Remember, you may also have a longer-acting insulin prescribed for you.    WHAT DO YOU NEED TO DO?   Check your blood sugar with your home blood glucose meter as recommended by your health care provider.   Using your correction scale, find the range that your blood sugar lies in.   Look for the units of insulin that match that blood sugar range. Give yourself the dose of correction insulin your health care provider has prescribed. Always make sure you are using the right type of insulin.   Prior to the injection, make sure you have food available that you can eat in the next 15-30 minutes.   If your correction insulin is rapid acting, start eating your meal within 15 minutes after you have given yourself the insulin injection. If you wait longer than 15 minutes to eat, your blood sugar might get too low.   If your correction insulin is short acting(regular), start eating your meal within 30 minutes after you have given yourself the insulin injection. If you wait longer than 30 minutes to eat, your blood sugar might get too low. Symptoms of low blood sugar (hypoglycemia) may include feeling shaky or weak, sweating, feeling confused, difficulty seeing, agitation, crankiness, or numbness of the lips or tongue. Check your blood  sugar immediately and treat your results as directed by your health care provider.   Keep a log of your blood sugar results with the time you took the test and the amount of insulin that you injected. This information will help your health care provider manage your medicines.   Note on your log anything that may affect your blood sugar level, such as:   Changes in normal exercise or activity.   Changes in your normal schedule, such as staying up late, going on vacation, changing your diet, or holidays.   New medicines. This includes prescription and over-the-counter medicines. Some medicines may cause high blood sugar.   Sickness, stress, or anxiety.   Changes in the time you took your medicine.   Changes in your meals, such  as skipping a meal, having a late meal, or dining out.   Eating things that may affect blood glucose, such as snacks, meal portions that are larger than normal, drinks with sugar, or eating less than usual.   Ask your health care provider any questions you have.  Be aware of "stacking" your insulin doses. This happens when you correct a high blood sugar level by giving yourself extra insulin too soon after a previous correction dose or mealtime dose. You may then have too much insulin still active in your body and may be at risk for hypoglycemia. WHY DO YOU NEED A CORRECTION SCALE IF YOU HAVE NEVER BEEN DIAGNOSED WITH DIABETES?   Keeping your blood glucose in the target range is important for your overall health.   You may have been prescribed medicines that cause your blood glucose to be higher than normal. WHEN SHOULD YOU SEEK MEDICAL CARE? Contact your health care provider if:   You have experienced hypoglycemia that you are unable to treat with your usual routine.   You have a high blood sugar level that is not coming down with the correction dose.  Your blood sugar is often too low or does not come up even if you eat a fast-acting carbohydrate. Someone who lives with you should seek immediate medical care if you become unresponsive.   This information is not intended to replace advice given to you by your health care provider. Make sure you discuss any questions you have with your health care provider.   Follow up with your primary care provider for re-evaluation. Push fluids. Eat a regular diet. Return to the ED if you experience severe worsening on your symptoms, increased confusion, fever, intractable vomiting, increased blood glucose that is unresponsive to home insulin.

## 2015-06-20 NOTE — ED Notes (Signed)
[facility]

## 2015-06-20 NOTE — ED Notes (Signed)
[facility] contacted and report given

## 2015-06-20 NOTE — ED Notes (Signed)
CT notified pt has IV, will come to get patient

## 2015-06-20 NOTE — ED Notes (Signed)
Per EMS: [facility]  Pt has been seen several times this week for the same.  Pt CBG 467 with EMS.  Pt lethargic, but oriented.  Pt has 1 episode of emesis en route and given 4mg  of Zofran IV.  Pt had fluids hung at nursing home, and are running wide open.  Has received approx 300cc.

## 2015-06-20 NOTE — ED Notes (Signed)
Applesauce provided to pt; pt attempting drink applesauce; Pt states "Please don't help me, I can do it".

## 2015-06-21 DIAGNOSIS — Z9841 Cataract extraction status, right eye: Secondary | ICD-10-CM | POA: Diagnosis not present

## 2015-06-21 DIAGNOSIS — K219 Gastro-esophageal reflux disease without esophagitis: Secondary | ICD-10-CM | POA: Diagnosis present

## 2015-06-21 DIAGNOSIS — E109 Type 1 diabetes mellitus without complications: Secondary | ICD-10-CM | POA: Diagnosis not present

## 2015-06-21 DIAGNOSIS — Z66 Do not resuscitate: Secondary | ICD-10-CM | POA: Diagnosis not present

## 2015-06-21 DIAGNOSIS — F0390 Unspecified dementia without behavioral disturbance: Secondary | ICD-10-CM | POA: Diagnosis present

## 2015-06-21 DIAGNOSIS — I2510 Atherosclerotic heart disease of native coronary artery without angina pectoris: Secondary | ICD-10-CM | POA: Diagnosis present

## 2015-06-21 DIAGNOSIS — K5900 Constipation, unspecified: Secondary | ICD-10-CM | POA: Diagnosis present

## 2015-06-21 DIAGNOSIS — G8929 Other chronic pain: Secondary | ICD-10-CM | POA: Diagnosis present

## 2015-06-21 DIAGNOSIS — F419 Anxiety disorder, unspecified: Secondary | ICD-10-CM | POA: Diagnosis present

## 2015-06-21 DIAGNOSIS — R911 Solitary pulmonary nodule: Secondary | ICD-10-CM | POA: Diagnosis present

## 2015-06-21 DIAGNOSIS — E860 Dehydration: Secondary | ICD-10-CM | POA: Diagnosis present

## 2015-06-21 DIAGNOSIS — A084 Viral intestinal infection, unspecified: Secondary | ICD-10-CM | POA: Diagnosis present

## 2015-06-21 DIAGNOSIS — Z886 Allergy status to analgesic agent status: Secondary | ICD-10-CM | POA: Diagnosis not present

## 2015-06-21 DIAGNOSIS — E138 Other specified diabetes mellitus with unspecified complications: Secondary | ICD-10-CM | POA: Diagnosis not present

## 2015-06-21 DIAGNOSIS — M545 Low back pain: Secondary | ICD-10-CM | POA: Diagnosis present

## 2015-06-21 DIAGNOSIS — E1043 Type 1 diabetes mellitus with diabetic autonomic (poly)neuropathy: Secondary | ICD-10-CM | POA: Diagnosis present

## 2015-06-21 DIAGNOSIS — F0391 Unspecified dementia with behavioral disturbance: Secondary | ICD-10-CM | POA: Diagnosis not present

## 2015-06-21 DIAGNOSIS — E108 Type 1 diabetes mellitus with unspecified complications: Secondary | ICD-10-CM | POA: Diagnosis not present

## 2015-06-21 DIAGNOSIS — Z87891 Personal history of nicotine dependence: Secondary | ICD-10-CM | POA: Diagnosis not present

## 2015-06-21 DIAGNOSIS — Z955 Presence of coronary angioplasty implant and graft: Secondary | ICD-10-CM | POA: Diagnosis not present

## 2015-06-21 DIAGNOSIS — R627 Adult failure to thrive: Secondary | ICD-10-CM | POA: Diagnosis not present

## 2015-06-21 DIAGNOSIS — E1010 Type 1 diabetes mellitus with ketoacidosis without coma: Secondary | ICD-10-CM | POA: Diagnosis not present

## 2015-06-21 DIAGNOSIS — L8990 Pressure ulcer of unspecified site, unspecified stage: Secondary | ICD-10-CM | POA: Insufficient documentation

## 2015-06-21 DIAGNOSIS — K3184 Gastroparesis: Secondary | ICD-10-CM | POA: Diagnosis present

## 2015-06-21 DIAGNOSIS — M797 Fibromyalgia: Secondary | ICD-10-CM | POA: Diagnosis present

## 2015-06-21 DIAGNOSIS — Z7982 Long term (current) use of aspirin: Secondary | ICD-10-CM | POA: Diagnosis not present

## 2015-06-21 DIAGNOSIS — Z961 Presence of intraocular lens: Secondary | ICD-10-CM | POA: Diagnosis present

## 2015-06-21 DIAGNOSIS — M8580 Other specified disorders of bone density and structure, unspecified site: Secondary | ICD-10-CM | POA: Diagnosis present

## 2015-06-21 DIAGNOSIS — N179 Acute kidney failure, unspecified: Secondary | ICD-10-CM | POA: Diagnosis present

## 2015-06-21 DIAGNOSIS — E10319 Type 1 diabetes mellitus with unspecified diabetic retinopathy without macular edema: Secondary | ICD-10-CM | POA: Diagnosis present

## 2015-06-21 DIAGNOSIS — R739 Hyperglycemia, unspecified: Secondary | ICD-10-CM | POA: Diagnosis not present

## 2015-06-21 DIAGNOSIS — E43 Unspecified severe protein-calorie malnutrition: Secondary | ICD-10-CM | POA: Diagnosis present

## 2015-06-21 DIAGNOSIS — Z8673 Personal history of transient ischemic attack (TIA), and cerebral infarction without residual deficits: Secondary | ICD-10-CM | POA: Diagnosis not present

## 2015-06-21 DIAGNOSIS — D649 Anemia, unspecified: Secondary | ICD-10-CM | POA: Diagnosis present

## 2015-06-21 DIAGNOSIS — Z888 Allergy status to other drugs, medicaments and biological substances status: Secondary | ICD-10-CM | POA: Diagnosis not present

## 2015-06-21 DIAGNOSIS — Z794 Long term (current) use of insulin: Secondary | ICD-10-CM | POA: Diagnosis not present

## 2015-06-21 DIAGNOSIS — F329 Major depressive disorder, single episode, unspecified: Secondary | ICD-10-CM | POA: Diagnosis present

## 2015-06-21 DIAGNOSIS — E876 Hypokalemia: Secondary | ICD-10-CM | POA: Diagnosis not present

## 2015-06-21 DIAGNOSIS — E11649 Type 2 diabetes mellitus with hypoglycemia without coma: Secondary | ICD-10-CM | POA: Diagnosis not present

## 2015-06-21 DIAGNOSIS — Z981 Arthrodesis status: Secondary | ICD-10-CM | POA: Diagnosis not present

## 2015-06-21 DIAGNOSIS — Z9842 Cataract extraction status, left eye: Secondary | ICD-10-CM | POA: Diagnosis not present

## 2015-06-21 DIAGNOSIS — E785 Hyperlipidemia, unspecified: Secondary | ICD-10-CM | POA: Diagnosis present

## 2015-06-21 LAB — BASIC METABOLIC PANEL
Anion gap: 17 — ABNORMAL HIGH (ref 5–15)
Anion gap: 19 — ABNORMAL HIGH (ref 5–15)
Anion gap: 15 (ref 5–15)
BUN: 14 mg/dL (ref 6–20)
BUN: 14 mg/dL (ref 6–20)
BUN: 7 mg/dL (ref 6–20)
CALCIUM: 9.3 mg/dL (ref 8.9–10.3)
CO2: 17 mmol/L — ABNORMAL LOW (ref 22–32)
CO2: 17 mmol/L — ABNORMAL LOW (ref 22–32)
CO2: 21 mmol/L — ABNORMAL LOW (ref 22–32)
CREATININE: 1.13 mg/dL — AB (ref 0.44–1.00)
CREATININE: 0.94 mg/dL (ref 0.44–1.00)
CREATININE: 0.85 mg/dL (ref 0.44–1.00)
Calcium: 9.7 mg/dL (ref 8.9–10.3)
Calcium: 10 mg/dL (ref 8.9–10.3)
Chloride: 110 mmol/L (ref 101–111)
Chloride: 108 mmol/L (ref 101–111)
Chloride: 107 mmol/L (ref 101–111)
GFR calc Af Amer: 54 mL/min — ABNORMAL LOW (ref >60)
GFR calc Af Amer: >60 mL/min (ref >60)
GFR calc Af Amer: >60 mL/min (ref >60)
GFR, EST NON AFRICAN AMERICAN: 47 mL/min — AB (ref >60)
GFR, EST NON AFRICAN AMERICAN: 58 mL/min — AB (ref >60)
GLUCOSE: 174 mg/dL — AB (ref 65–99)
GLUCOSE: 104 mg/dL — AB (ref 65–99)
Glucose, Bld: 230 mg/dL — ABNORMAL HIGH (ref 65–99)
Potassium: 3.3 mmol/L — ABNORMAL LOW (ref 3.5–5.1)
Potassium: 3.9 mmol/L (ref 3.5–5.1)
Potassium: 3.3 mmol/L — ABNORMAL LOW (ref 3.5–5.1)
SODIUM: 144 mmol/L (ref 135–145)
SODIUM: 144 mmol/L (ref 135–145)
SODIUM: 143 mmol/L (ref 135–145)

## 2015-06-21 LAB — GLUCOSE, CAPILLARY
GLUCOSE-CAPILLARY: 186 mg/dL — AB (ref 65–99)
GLUCOSE-CAPILLARY: 155 mg/dL — AB (ref 65–99)
GLUCOSE-CAPILLARY: 161 mg/dL — AB (ref 65–99)
GLUCOSE-CAPILLARY: 176 mg/dL — AB (ref 65–99)
GLUCOSE-CAPILLARY: 136 mg/dL — AB (ref 65–99)
GLUCOSE-CAPILLARY: 91 mg/dL (ref 65–99)
Glucose-Capillary: 167 mg/dL — ABNORMAL HIGH (ref 65–99)
Glucose-Capillary: 203 mg/dL — ABNORMAL HIGH (ref 65–99)
Glucose-Capillary: 185 mg/dL — ABNORMAL HIGH (ref 65–99)
Glucose-Capillary: 113 mg/dL — ABNORMAL HIGH (ref 65–99)
Glucose-Capillary: 104 mg/dL — ABNORMAL HIGH (ref 65–99)
Glucose-Capillary: 143 mg/dL — ABNORMAL HIGH (ref 65–99)
Glucose-Capillary: 135 mg/dL — ABNORMAL HIGH (ref 65–99)

## 2015-06-21 LAB — URINE CULTURE: Culture: NO GROWTH

## 2015-06-21 LAB — CBG MONITORING, ED
GLUCOSE-CAPILLARY: 282 mg/dL — AB (ref 65–99)
GLUCOSE-CAPILLARY: 280 mg/dL — AB (ref 65–99)
Glucose-Capillary: 192 mg/dL — ABNORMAL HIGH (ref 65–99)
Glucose-Capillary: 151 mg/dL — ABNORMAL HIGH (ref 65–99)
Glucose-Capillary: 173 mg/dL — ABNORMAL HIGH (ref 65–99)

## 2015-06-21 LAB — MRSA PCR SCREENING: MRSA BY PCR: NEGATIVE

## 2015-06-21 MED ORDER — ADULT MULTIVITAMIN W/MINERALS CH
1.0000 | ORAL_TABLET | Freq: Every day | ORAL | Status: DC
  Administered 2015-06-21 – 2015-06-27 (×6): 1 via ORAL
  Filled 2015-06-21 (×6): qty 1

## 2015-06-21 MED ORDER — HEPARIN SODIUM (PORCINE) 5000 UNIT/ML IJ SOLN
5000.0000 [IU] | Freq: Three times a day (TID) | INTRAMUSCULAR | Status: DC
  Administered 2015-06-21 – 2015-06-27 (×19): 5000 [IU] via SUBCUTANEOUS
  Filled 2015-06-21 (×19): qty 1

## 2015-06-21 MED ORDER — PANTOPRAZOLE SODIUM 40 MG PO TBEC
40.0000 mg | DELAYED_RELEASE_TABLET | Freq: Two times a day (BID) | ORAL | Status: DC
  Administered 2015-06-21 – 2015-06-27 (×13): 40 mg via ORAL
  Filled 2015-06-21 (×13): qty 1

## 2015-06-21 MED ORDER — INSULIN DETEMIR 100 UNIT/ML ~~LOC~~ SOLN
6.0000 [IU] | Freq: Two times a day (BID) | SUBCUTANEOUS | Status: DC
  Administered 2015-06-21 – 2015-06-22 (×4): 6 [IU] via SUBCUTANEOUS
  Filled 2015-06-21 (×6): qty 0.06

## 2015-06-21 MED ORDER — PRAZOSIN HCL 1 MG PO CAPS
1.0000 mg | ORAL_CAPSULE | Freq: Every day | ORAL | Status: DC
  Administered 2015-06-21 – 2015-06-26 (×5): 1 mg via ORAL
  Filled 2015-06-21 (×9): qty 1

## 2015-06-21 MED ORDER — POTASSIUM CHLORIDE 10 MEQ/100ML IV SOLN
10.0000 meq | INTRAVENOUS | Status: AC
  Administered 2015-06-21 (×4): 10 meq via INTRAVENOUS
  Filled 2015-06-21 (×4): qty 100

## 2015-06-21 MED ORDER — LORAZEPAM 0.5 MG PO TABS
0.5000 mg | ORAL_TABLET | Freq: Every day | ORAL | Status: DC
  Administered 2015-06-21 – 2015-06-26 (×6): 0.5 mg via ORAL
  Filled 2015-06-21 (×6): qty 1

## 2015-06-21 MED ORDER — DEXTROSE-NACL 5-0.45 % IV SOLN
INTRAVENOUS | Status: DC
  Administered 2015-06-21: 15:00:00 via INTRAVENOUS
  Administered 2015-06-21: 125 mL via INTRAVENOUS
  Administered 2015-06-21: 03:00:00 via INTRAVENOUS
  Administered 2015-06-22: 125 mL via INTRAVENOUS

## 2015-06-21 MED ORDER — BISACODYL 10 MG RE SUPP
10.0000 mg | Freq: Once | RECTAL | Status: AC
  Administered 2015-06-21: 10 mg via RECTAL
  Filled 2015-06-21: qty 1

## 2015-06-21 MED ORDER — ARIPIPRAZOLE 10 MG PO TABS
5.0000 mg | ORAL_TABLET | Freq: Every morning | ORAL | Status: DC
  Administered 2015-06-21 – 2015-06-27 (×7): 5 mg via ORAL
  Filled 2015-06-21 (×9): qty 1

## 2015-06-21 MED ORDER — ROSUVASTATIN CALCIUM 5 MG PO TABS
5.0000 mg | ORAL_TABLET | Freq: Every day | ORAL | Status: DC
  Administered 2015-06-21 – 2015-06-26 (×6): 5 mg via ORAL
  Filled 2015-06-21 (×8): qty 1

## 2015-06-21 MED ORDER — POTASSIUM CHLORIDE CRYS ER 20 MEQ PO TBCR
40.0000 meq | EXTENDED_RELEASE_TABLET | Freq: Once | ORAL | Status: AC
  Administered 2015-06-21: 40 meq via ORAL
  Filled 2015-06-21: qty 2

## 2015-06-21 MED ORDER — ASPIRIN EC 81 MG PO TBEC
81.0000 mg | DELAYED_RELEASE_TABLET | Freq: Every day | ORAL | Status: DC
  Administered 2015-06-21 – 2015-06-27 (×7): 81 mg via ORAL
  Filled 2015-06-21 (×7): qty 1

## 2015-06-21 MED ORDER — AZITHROMYCIN 250 MG PO TABS: 500.0000 mg | ORAL_TABLET | Freq: Every day | ORAL | Status: DC

## 2015-06-21 MED ORDER — LAMOTRIGINE 25 MG PO TABS
50.0000 mg | ORAL_TABLET | Freq: Every day | ORAL | Status: DC
  Administered 2015-06-21 – 2015-06-26 (×6): 50 mg via ORAL
  Filled 2015-06-21 (×8): qty 2

## 2015-06-21 MED ORDER — DONEPEZIL HCL 10 MG PO TABS
10.0000 mg | ORAL_TABLET | Freq: Every day | ORAL | Status: DC
  Administered 2015-06-21 – 2015-06-26 (×6): 10 mg via ORAL
  Filled 2015-06-21 (×7): qty 1

## 2015-06-21 MED ORDER — AZITHROMYCIN 250 MG PO TABS: 250.0000 mg | ORAL_TABLET | Freq: Every day | ORAL | Status: DC

## 2015-06-21 MED ORDER — SENNOSIDES-DOCUSATE SODIUM 8.6-50 MG PO TABS
2.0000 | ORAL_TABLET | Freq: Two times a day (BID) | ORAL | Status: DC
  Administered 2015-06-21 – 2015-06-27 (×12): 2 via ORAL
  Filled 2015-06-21 (×13): qty 2

## 2015-06-21 MED ORDER — INSULIN ASPART 100 UNIT/ML ~~LOC~~ SOLN
0.0000 [IU] | Freq: Three times a day (TID) | SUBCUTANEOUS | Status: DC
  Administered 2015-06-21: 1 [IU] via SUBCUTANEOUS
  Administered 2015-06-23: 7 [IU] via SUBCUTANEOUS
  Administered 2015-06-23: 2 [IU] via SUBCUTANEOUS
  Administered 2015-06-24: 9 [IU] via SUBCUTANEOUS

## 2015-06-21 MED ORDER — ENSURE ENLIVE PO LIQD
237.0000 mL | Freq: Two times a day (BID) | ORAL | Status: DC
  Administered 2015-06-22 – 2015-06-25 (×7): 237 mL via ORAL

## 2015-06-21 MED ORDER — HALOPERIDOL LACTATE 5 MG/ML IJ SOLN
1.0000 mg | Freq: Two times a day (BID) | INTRAMUSCULAR | Status: DC | PRN
  Administered 2015-06-21: 1 mg via INTRAVENOUS

## 2015-06-21 MED ORDER — SENNA-DOCUSATE SODIUM 8.6-50 MG PO TABS: 2.0000 | ORAL_TABLET | Freq: Two times a day (BID) | ORAL | Status: DC

## 2015-06-21 MED ORDER — POLYETHYLENE GLYCOL 3350 17 G PO PACK: 17.0000 g | PACK | Freq: Every day | ORAL | Status: DC | PRN

## 2015-06-21 MED ORDER — HALOPERIDOL LACTATE 5 MG/ML IJ SOLN
INTRAMUSCULAR | Status: AC
  Filled 2015-06-21: qty 1

## 2015-06-21 MED ORDER — POLYETHYLENE GLYCOL 3350 17 G PO PACK
17.0000 g | PACK | Freq: Two times a day (BID) | ORAL | Status: DC
  Administered 2015-06-21 – 2015-06-27 (×9): 17 g via ORAL
  Filled 2015-06-21 (×12): qty 1

## 2015-06-21 MED ORDER — INSULIN ASPART 100 UNIT/ML ~~LOC~~ SOLN: 0.0000 [IU] | Freq: Every day | SUBCUTANEOUS | Status: DC

## 2015-06-21 MED ORDER — SODIUM CHLORIDE 0.9 % IV SOLN
INTRAVENOUS | Status: DC
  Administered 2015-06-21: 02:00:00 via INTRAVENOUS

## 2015-06-21 MED ORDER — ONDANSETRON HCL 4 MG/2ML IJ SOLN
4.0000 mg | Freq: Four times a day (QID) | INTRAMUSCULAR | Status: DC | PRN
  Administered 2015-06-21: 4 mg via INTRAVENOUS
  Filled 2015-06-21: qty 2

## 2015-06-21 MED ORDER — AMLODIPINE BESYLATE 10 MG PO TABS
10.0000 mg | ORAL_TABLET | Freq: Every day | ORAL | Status: DC
  Administered 2015-06-21 – 2015-06-27 (×7): 10 mg via ORAL
  Filled 2015-06-21 (×7): qty 1

## 2015-06-21 MED ORDER — FERROUS SULFATE 325 (65 FE) MG PO TABS
325.0000 mg | ORAL_TABLET | Freq: Two times a day (BID) | ORAL | Status: DC
  Administered 2015-06-21 – 2015-06-27 (×13): 325 mg via ORAL
  Filled 2015-06-21 (×13): qty 1

## 2015-06-21 MED ORDER — SERTRALINE HCL 50 MG PO TABS
150.0000 mg | ORAL_TABLET | Freq: Every day | ORAL | Status: DC
  Administered 2015-06-21 – 2015-06-27 (×7): 150 mg via ORAL
  Filled 2015-06-21 (×7): qty 1

## 2015-06-21 MED ORDER — SODIUM CHLORIDE 0.9 % IV SOLN
INTRAVENOUS | Status: DC
  Filled 2015-06-21: qty 2.5

## 2015-06-21 MED ORDER — TRAMADOL HCL 50 MG PO TABS
50.0000 mg | ORAL_TABLET | Freq: Four times a day (QID) | ORAL | Status: DC | PRN
  Administered 2015-06-25: 50 mg via ORAL
  Filled 2015-06-21: qty 1

## 2015-06-21 MED ORDER — PREDNISONE 20 MG PO TABS: 50.0000 mg | ORAL_TABLET | Freq: Every day | ORAL | Status: DC

## 2015-06-21 MED ORDER — SODIUM CHLORIDE 0.9 % IV SOLN: INTRAVENOUS | Status: DC

## 2015-06-21 MED ORDER — SODIUM CHLORIDE 0.9 % IV SOLN
1000.0000 mL | Freq: Once | INTRAVENOUS | Status: AC
  Administered 2015-06-21: 1000 mL via INTRAVENOUS

## 2015-06-21 MED ORDER — NUTRITIONAL SUPPLEMENT PO LIQD: Freq: Two times a day (BID) | ORAL | Status: DC

## 2015-06-21 MED ORDER — ACETAMINOPHEN 325 MG PO TABS: 650.0000 mg | ORAL_TABLET | Freq: Four times a day (QID) | ORAL | Status: DC | PRN

## 2015-06-21 NOTE — ED Notes (Signed)
CBG 280 

## 2015-06-21 NOTE — H&P (Signed)
[facility] History and Physical  Beth Aristahomasine C Weingartner OZH:[id number] DOB: 10/21/1940 DOA: 06/20/2015  Referring physician: EDP PCP: Julian HySOUTH,STEPHEN ALAN, MD   Chief Complaint: N/V   HPI: Beth Payne is a 75 y.o. female with h/o DM1, dementia, multiple admits for DKA.  Patient presents to the ED with N/V, she had similar symptoms yesterday, but they have been persistent and worsening today.  Yesterday she had a normal anion gap on labs, today it is elevated and she is in DKA.  Patient is unfortunately demented, and slightly altered, and not able to really give much of a great history.  CT head, chest, abd, pelvis, done yesterday showed constipation, and not much else acute.  Review of Systems: Systems reviewed.  As above, otherwise negative  Past Medical History  Diagnosis Date  . Coronary artery disease   . Depression   . Retinopathy   . Anxiety   . Heart murmur   . TIA (transient ischemic attack)   . Arthritis     "fingers" (12/23/2014)  . Fibromyalgia   . Type I diabetes mellitus (HCC) dx'd 1953    "I'm on a pup" (12/23/2014)  . Chronic lower back pain   . GERD (gastroesophageal reflux disease) dx'd 1995  . Osteopenia dx'd 2007  . Dementia    Past Surgical History  Procedure Laterality Date  . Cataract extraction w/ intraocular lens  implant, bilateral Bilateral 1998  . Tonsillectomy    . Appendectomy    . Bilateral carpal tunnel release Bilateral 2003-2004  . Back surgery    . Anterior cervical decomp/discectomy fusion  05/2003    "C5-6"  . Abdominal hysterectomy  1972  . Dilation and curettage of uterus    . Cesarean section    . Eye surgery Bilateral since 1977    "numerous slaser surgeries for retinopathy"  . Trigger finger release Bilateral 2003  . Cervical disc surgery  2004    "collapsed C-5"  . Fracture surgery    . Patella fracture surgery Left 04/2007  . Vertebroplasty  01/2008    "compressed lumbar fracture"  . Laparoscopic lysis of adhesions   1975-1987 X 5  . Exploratory laparotomy  [id number] X 1    "adhesions"  . Oophorectomy Bilateral 1980's  . Vitrectomy Right 08/1997  . Bunionectomy with hammertoe reconstruction Right 02/1999  . Coronary angioplasty with stent placement  07/1999    "?1"  . Lumbar microdiscectomy  03/2005  . Orif ankle fracture Left 12/25/2014    Procedure: OPEN REDUCTION INTERNAL FIXATION (ORIF) LEFT TRIMALLEOLAR ANKLE FRACTURE;  Surgeon: Tarry KosNaiping M Xu, MD;  Location: [facility] OR;  Service: Orthopedics;  Laterality: Left;   Social History:  reports that she has quit smoking. Her smoking use included Cigarettes. She has a 27 pack-year smoking history. She has never used smokeless tobacco. She reports that she does not drink alcohol or use illicit drugs.  Allergies  Allergen Reactions  . Diclofenac Sodium Swelling and Other (See Comments)    Blurred vision  . Doxycycline Calcium Swelling and Other (See Comments)    Blurred vision  . Codeine Nausea And Vomiting and Other (See Comments)    Dizziness   . Zocor [Simvastatin] Other (See Comments)    Dizziness, weakness     Family History  Problem Relation Age of Onset  . Liver disease Sister   . Colon cancer Brother      Prior to Admission medications   Medication Sig Start Date End Date Taking? Authorizing Provider  acetaminophen (  TYLENOL) 325 MG tablet Take 2 tablets (650 mg total) by mouth every 6 (six) hours as needed for mild pain (or Fever >/= 101). 12/27/14  Yes Christiane Ha, MD  amLODipine (NORVASC) 10 MG tablet Take 1 tablet (10 mg total) by mouth daily. 05/22/15  Yes Richarda Overlie, MD  ARIPiprazole (ABILIFY) 5 MG tablet Take 1 tablet (5 mg total) by mouth every morning. 04/28/15  Yes Shanker Levora Dredge, MD  aspirin EC 81 MG tablet Take 1 tablet (81 mg total) by mouth daily. 04/28/15  Yes Shanker Levora Dredge, MD  Cholecalciferol (VITAMIN D3) 2000 UNITS TABS Take 2,000 Units by mouth daily. For supplement   Yes Historical Provider, MD  donepezil  (ARICEPT) 10 MG tablet Take 10 mg by mouth at bedtime.   Yes Historical Provider, MD  ferrous sulfate 325 (65 FE) MG tablet Take 325 mg by mouth 2 (two) times daily.   Yes Historical Provider, MD  insulin aspart (NOVOLOG) 100 UNIT/ML injection 0-9 Units, Subcutaneous, 3 times daily with meals CBG < 70: implement hypoglycemia protocol CBG 70 - 120: 0 units CBG 121 - 150: 1 unit CBG 151 - 200: 2 units CBG 201 - 250: 3 units CBG 251 - 300: 5 units CBG 301 - 350: 7 units CBG 351 - 400: 9 units CBG > 400: call MD 04/28/15  Yes Shanker Levora Dredge, MD  insulin detemir (LEVEMIR) 100 UNIT/ML injection Inject 6 Units into the skin 2 (two) times daily.    Yes Historical Provider, MD  lamoTRIgine (LAMICTAL) 100 MG tablet Take 50 mg by mouth at bedtime.    Yes Historical Provider, MD  LORazepam (ATIVAN) 0.5 MG tablet Take 1 tablet (0.5 mg total) by mouth at bedtime. Anxiety 05/22/15  Yes Richarda Overlie, MD  Multiple Vitamin (MULITIVITAMIN WITH MINERALS) TABS Take 1 tablet by mouth daily.   Yes Historical Provider, MD  Nutritional Supplements (NUTRITIONAL SUPPLEMENT PO) Take 1 Can by mouth 2 (two) times daily. Med Pass   Yes Historical Provider, MD  OVER THE COUNTER MEDICATION Take 1 capsule by mouth daily. Decubivite   Yes Historical Provider, MD  pantoprazole (PROTONIX) 40 MG tablet Take 1 tablet (40 mg total) by mouth 2 (two) times daily. 04/28/15  Yes Shanker Levora Dredge, MD  prazosin (MINIPRESS) 1 MG capsule Take 1 mg by mouth at bedtime.    Yes Historical Provider, MD  rosuvastatin (CRESTOR) 5 MG tablet Take 5 mg by mouth daily.   Yes Historical Provider, MD  sertraline (ZOLOFT) 100 MG tablet Take 150 mg by mouth daily.   Yes Historical Provider, MD  traMADol (ULTRAM) 50 MG tablet Take 50 mg by mouth every 6 (six) hours as needed for moderate pain.   Yes Historical Provider, MD  insulin glargine (LANTUS) 100 UNIT/ML injection Inject 0.18 mLs (18 Units total) into the skin at bedtime. Patient not taking:  Reported on 06/20/2015 04/28/15   Maretta Bees, MD  polyethylene glycol [facility] Ethelene Hal) packet Take 17 g by mouth daily as needed for mild constipation. Patient taking differently: Take 17 g by mouth daily as needed for mild constipation or moderate constipation.  12/27/14   Christiane Ha, MD  sennosides-docusate sodium (SENOKOT-S) 8.6-50 MG tablet Take 2 tablets by mouth 2 (two) times daily.    Historical Provider, MD   Physical Exam: Filed Vitals:   06/20/15 2300 06/21/15 0014  BP: 150/67 168/64  Pulse: 97 109  Temp:    Resp:  24    BP  168/64 mmHg  Pulse 109  Temp(Src) 97.6 F (36.4 C) (Oral)  Resp 24  SpO2 100%  General Appearance:    Alert, demented, tachypnea, appears stated age  Head:    Normocephalic, atraumatic  Eyes:    PERRL, EOMI, sclera non-icteric        Nose:   Nares without drainage or epistaxis. Mucosa, turbinates normal  Throat:   Moist mucous membranes. Oropharynx without erythema or exudate.  Neck:   Supple. No carotid bruits.  No thyromegaly.  No lymphadenopathy.   Back:     No CVA tenderness, no spinal tenderness  Lungs:     Clear to auscultation bilaterally, without wheezes, rhonchi or rales  Chest wall:    No tenderness to palpitation  Heart:    Regular rate and rhythm without murmurs, gallops, rubs  Abdomen:     Soft, non-tender, nondistended, normal bowel sounds, no organomegaly  Genitalia:    deferred  Rectal:    deferred  Extremities:   No clubbing, cyanosis or edema.  Pulses:   2+ and symmetric all extremities  Skin:   Skin color, texture, turgor normal, no rashes or lesions  Lymph nodes:   Cervical, supraclavicular, and axillary nodes normal  Neurologic:   CNII-XII intact. Normal strength, sensation and reflexes      throughout    Labs on Admission:  Basic Metabolic Panel:  Recent Labs Lab 06/19/15 2101 06/20/15 2215  NA 143 142  K 3.9 3.3*  CL 103 104  CO2 29 16*  GLUCOSE 113* 282*  BUN 25* 18  CREATININE 0.90 1.27*   CALCIUM 10.5* 10.1   Liver Function Tests:  Recent Labs Lab 06/19/15 2101 06/20/15 2215  AST 54* 56*  ALT 58* 57*  ALKPHOS 106 102  BILITOT 0.5 1.2  PROT 7.0 6.5  ALBUMIN 4.1 3.8    Recent Labs Lab 06/19/15 2101  LIPASE 23   No results for input(s): AMMONIA in the last 168 hours. CBC:  Recent Labs Lab 06/19/15 2101 06/20/15 2215  WBC 11.7* 13.5*  NEUTROABS 9.2* 11.6*  HGB 10.5* 10.8*  HCT 31.3* 32.4*  MCV 85.3 84.4  PLT 302 309   Cardiac Enzymes:  Recent Labs Lab 06/19/15 2101  CKTOTAL 376*    BNP (last 3 results) No results for input(s): PROBNP in the last 8760 hours. CBG:  Recent Labs Lab 06/19/15 2023 06/19/15 2327 06/20/15 2032 06/21/15 0014  GLUCAP 109* 120* 315* 280*    Radiological Exams on Admission: Dg Chest 2 View  06/20/2015  CLINICAL DATA:  Altered mental status with fever and vomiting for 1 day. EXAM: CHEST  2 VIEW COMPARISON:  Radiographs and CT 05/20/2015 FINDINGS: Lung volumes are low. Unchanged cardiomediastinal contours with mild cardiomegaly. Coronary stent in place. Linear atelectasis or scarring in the lingula. No consolidation to suggest pneumonia. No pleural effusion or pneumothorax. Left rib fractures on prior exam are partially seen. Kyphoplasty in the thoracolumbar spine. IMPRESSION: Linear atelectasis or scarring in the lingula. No acute process or change from prior. Electronically Signed   By: Rubye Oaks M.D.   On: 06/20/2015 00:33   Ct Head Wo Contrast  06/19/2015  CLINICAL DATA:  Fever today. Vomiting beginning last night. Altered mental status. EXAM: CT HEAD WITHOUT CONTRAST TECHNIQUE: Contiguous axial images were obtained from the base of the skull through the vertex without intravenous contrast. COMPARISON:  Brain MRI 05/20/2015. FINDINGS: There is cortical atrophy and chronic microvascular ischemic change. No evidence of acute abnormality including infarct, hemorrhage, mass  lesion, mass effect, midline shift or  abnormal extra-axial fluid collections identified. No hydrocephalus or pneumocephalus. The calvarium is intact. IMPRESSION: No acute abnormality. Atrophy and chronic microvascular ischemic change. Electronically Signed   By: Drusilla Kanner M.D.   On: 06/19/2015 20:59   Ct Chest W Contrast  06/20/2015  CLINICAL DATA:  Acute onset of lethargy. Vomiting. Hyperglycemia. Initial encounter. EXAM: CT CHEST, ABDOMEN, AND PELVIS WITH CONTRAST TECHNIQUE: Multidetector CT imaging of the chest, abdomen and pelvis was performed following the standard protocol during bolus administration of intravenous contrast. CONTRAST:  OMNIPAQUE IOHEXOL 300 MG/ML  SOLN COMPARISON:  CTA of the chest performed 05/20/2015, and MRI of the thoracic and lumbar spine performed 02/10/2011 FINDINGS: CT CHEST Bilateral atelectasis or scarring is noted. Minimal nodular opacity is noted at the superior aspect of the left lower lobe. This could reflect a mild infectious process. The lungs are otherwise clear. No pleural effusion or pneumothorax is seen. No suspicious masses are identified. Diffuse coronary artery calcifications are seen. Scattered calcification is noted along the thoracic aorta. No mediastinal lymphadenopathy is seen. No pericardial effusion is identified. The great vessels are grossly unremarkable in appearance. The thyroid gland is unremarkable in appearance. No axillary lymphadenopathy is seen. No acute osseous abnormalities are identified. Cervical spinal fusion hardware is noted. CT ABDOMEN AND PELVIS The liver and spleen are unremarkable in appearance. The gallbladder is within normal limits. The pancreas and adrenal glands are unremarkable. The kidneys are unremarkable in appearance. There is no evidence of hydronephrosis. No renal or ureteral stones are seen. No perinephric stranding is appreciated. No free fluid is identified. The small bowel is unremarkable in appearance. The stomach is within normal limits. No acute  vascular abnormalities are seen. Diffuse calcification is noted along the abdominal aorta and its branches. The patient is status post appendectomy. The colon is largely decompressed, aside from a relatively large amount of stool in the rectum. The bladder is mildly distended and grossly unremarkable. The patient is status post hysterectomy. No suspicious adnexal masses are seen. No inguinal lymphadenopathy is seen. No acute osseous abnormalities are identified. The patient is status post vertebroplasty at L1. IMPRESSION: 1. No acute abnormality seen to explain the patient's symptoms. 2. Minimal nodular opacity at the superior aspect of the left lower lung lobe. This could reflect a mild infectious process. Bilateral atelectasis or scarring noted. 3. Diffuse coronary artery calcifications seen. 4. Diffuse calcification along the abdominal aorta and its branches. 5. Relatively large amount of stool in the rectum. Colon otherwise largely decompressed. Electronically Signed   By: Roanna Raider M.D.   On: 06/20/2015 23:45   Ct Abdomen Pelvis W Contrast  06/20/2015  CLINICAL DATA:  Acute onset of lethargy. Vomiting. Hyperglycemia. Initial encounter. EXAM: CT CHEST, ABDOMEN, AND PELVIS WITH CONTRAST TECHNIQUE: Multidetector CT imaging of the chest, abdomen and pelvis was performed following the standard protocol during bolus administration of intravenous contrast. CONTRAST:  OMNIPAQUE IOHEXOL 300 MG/ML  SOLN COMPARISON:  CTA of the chest performed 05/20/2015, and MRI of the thoracic and lumbar spine performed 02/10/2011 FINDINGS: CT CHEST Bilateral atelectasis or scarring is noted. Minimal nodular opacity is noted at the superior aspect of the left lower lobe. This could reflect a mild infectious process. The lungs are otherwise clear. No pleural effusion or pneumothorax is seen. No suspicious masses are identified. Diffuse coronary artery calcifications are seen. Scattered calcification is noted along the  thoracic aorta. No mediastinal lymphadenopathy is seen. No pericardial effusion is identified.  The great vessels are grossly unremarkable in appearance. The thyroid gland is unremarkable in appearance. No axillary lymphadenopathy is seen. No acute osseous abnormalities are identified. Cervical spinal fusion hardware is noted. CT ABDOMEN AND PELVIS The liver and spleen are unremarkable in appearance. The gallbladder is within normal limits. The pancreas and adrenal glands are unremarkable. The kidneys are unremarkable in appearance. There is no evidence of hydronephrosis. No renal or ureteral stones are seen. No perinephric stranding is appreciated. No free fluid is identified. The small bowel is unremarkable in appearance. The stomach is within normal limits. No acute vascular abnormalities are seen. Diffuse calcification is noted along the abdominal aorta and its branches. The patient is status post appendectomy. The colon is largely decompressed, aside from a relatively large amount of stool in the rectum. The bladder is mildly distended and grossly unremarkable. The patient is status post hysterectomy. No suspicious adnexal masses are seen. No inguinal lymphadenopathy is seen. No acute osseous abnormalities are identified. The patient is status post vertebroplasty at L1. IMPRESSION: 1. No acute abnormality seen to explain the patient's symptoms. 2. Minimal nodular opacity at the superior aspect of the left lower lung lobe. This could reflect a mild infectious process. Bilateral atelectasis or scarring noted. 3. Diffuse coronary artery calcifications seen. 4. Diffuse calcification along the abdominal aorta and its branches. 5. Relatively large amount of stool in the rectum. Colon otherwise largely decompressed. Electronically Signed   By: Roanna Raider M.D.   On: 06/20/2015 23:45    EKG: Independently reviewed.  Assessment/Plan Principal Problem:   Diabetic ketoacidosis without coma associated with type 1  diabetes mellitus (HCC) Active Problems:   DM (diabetes mellitus), type 1 with complications (HCC)   1. DKA - 1. DKA pathway 2. BMP q4h per pathway 3. IVF 4. Insulin gtt 5. zofran PRN, suspect patient may have underlying gastroenteritis causing the N/V which set off her DKA 6. Patient was worked up extensively for pathology yesterday, but work up at that time including CT head, chest, abd, pelvis, was unrevealing    Code Status: Full  Family Communication: No family in room Disposition Plan: Admit to inpatient   Time spent: 70 min  Alaric Gladwin M. [facility] Pager [phone]  If 7AM-7PM, please contact the day team taking care of the patient [email or web address] Password [email or web address] 06/21/2015, 1:13 AM

## 2015-06-21 NOTE — Progress Notes (Addendum)
PROGRESS NOTE    Beth Payne ZOX:[id number] DOB: 1941-04-24 DOA: 06/20/2015 PCP: [name], MD  HPI/Brief narrative 75 year old female patient with history of type I DM, prior episodes of DKA admits, dementia, CAD, depression, anxiety, TIA, GERD presented to [facility] ED on 06/20/15 with complaints of nausea and vomiting and found to be in DKA. CT head, chest, abdomen and pelvis done day prior showed constipation but nothing acute. Admitted to stepdown for further management.   Assessment/Plan:   DKA in type I DM, not at goal - Hemoglobin A1c 05/20/15:8.2. - Admitted to stepdown unit and treating per DKA protocol with aggressive IV fluids, insulin drip, frequent BMP monitoring. - After DKA has resolved, we'll transition to Lantus and SSI. - This might have been separated by acute viral GE +/- medication noncompliance.  Dementia with behavioral disturbance - On 1/14, agitated, hitting out at staff and trying to get out of bed. - Secondary to hospitalization and metabolic derangement complicating underlying possible advanced dementia. Wellsite geologist and when necessary IV Haldol 1 mg every 12 hours for agitation. Continue Aricept. Monitor  Nausea and vomiting/possible acute viral GE - Improved/resolved. Resume diet after DKA resolved.  Dehydration - Increase IV fluids.  Hypokalemia - Replace and follow.  Acute kidney injury - Resolved.  Anemia - Stable.  Essential hypertension - Uncontrolled. Continue amlodipine & prazosin and monitor.  Anxiety and depression - Continue Abilify, Zoloft and Ativan at prior home doses.  HLD - Statins.  Constipation - Started bowel regimen.   DVT prophylaxis: DVT prophylaxis Code Status: As per RN discussion with healthcare power of attorney and bedside MOST form, patient is DO NOT RESUSCITATE which was confirmed by RN. Family Communication: None at bedside Disposition Plan: Continue management in stepdown unit. DC home when  medically stable.   Consultants:  None  Procedures:  None  Antimicrobials:  None   Subjective: Oriented only to self. Does not follow instructions consistently. Per RN, agitated, combative, hitting at staff and attempting to get out of bed.  Objective: Filed Vitals:   06/21/15 0830 06/21/15 0845 06/21/15 0900 06/21/15 1000  BP: 170/78 170/78 170/74 192/87  Pulse: 108 106 107 112  Temp:      TempSrc:      Resp: 15 34 16 27  Height:      Weight:      SpO2: 100% 100% 100% 100%    Intake/Output Summary (Last 24 hours) at 06/21/15 1116 Last data filed at 06/21/15 0800  Gross per 24 hour  Intake  402.5 ml  Output      0 ml  Net  402.5 ml   Filed Weights   06/21/15 0620  Weight: 40.9 kg (90 lb 2.7 oz)    Exam:  General exam: Small built and frail pleasant elderly female lying comfortably in bed. Oral mucosa dry. Respiratory system: Clear. No increased work of breathing. Cardiovascular system: S1 & S2 heard, RRR. No JVD, murmurs, gallops, clicks or pedal edema. Telemetry: SR-ST in the 100s. Gastrointestinal system: Abdomen is nondistended, soft and nontender. Normal bowel sounds heard. Central nervous system: Alert and oriented only to self. No focal neurological deficits. Extremities: Symmetric 5 x 5 power.   Data Reviewed: Basic Metabolic Panel:  Recent Labs Lab 06/19/15 2101 06/20/15 2215 06/21/15 0225 06/21/15 0527  NA 143 142 144 144  K 3.9 3.3* 3.3* 3.9  CL 103 104 110 108  CO2 29 16* 17* 17*  GLUCOSE 113* 282* 230* 174*  BUN 25* 18  14 14  CREATININE 0.90 1.27* 1.13* 0.94  CALCIUM 10.5* 10.1 9.3 9.7   Liver Function Tests:  Recent Labs Lab 06/19/15 2101 06/20/15 2215  AST 54* 56*  ALT 58* 57*  ALKPHOS 106 102  BILITOT 0.5 1.2  PROT 7.0 6.5  ALBUMIN 4.1 3.8    Recent Labs Lab 06/19/15 2101  LIPASE 23   No results for input(s): AMMONIA in the last 168 hours. CBC:  Recent Labs Lab 06/19/15 2101 06/20/15 2215  WBC 11.7* 13.5*    NEUTROABS 9.2* 11.6*  HGB 10.5* 10.8*  HCT 31.3* 32.4*  MCV 85.3 84.4  PLT 302 309   Cardiac Enzymes:  Recent Labs Lab 06/19/15 2101  CKTOTAL 376*   BNP (last 3 results) No results for input(s): PROBNP in the last 8760 hours. CBG:  Recent Labs Lab 06/21/15 0131 06/21/15 0236 06/21/15 0350 06/21/15 0500 06/21/15 0558  GLUCAP 282* 192* 151* 173* 167*    Recent Results (from the past 240 hour(s))  Urine culture     Status: None   Collection Time: 06/19/15  8:51 PM  Result Value Ref Range Status   Specimen Description URINE, CATHETERIZED  Final   Special Requests NONE  Final   Culture   Final    NO GROWTH 1 DAY Performed at [facility]    Report Status 06/21/2015 FINAL  Final  MRSA PCR Screening     Status: None   Collection Time: 06/21/15  6:15 AM  Result Value Ref Range Status   MRSA by PCR NEGATIVE NEGATIVE Final    Comment:        The GeneXpert MRSA Assay (FDA approved for NASAL specimens only), is one component of a comprehensive MRSA colonization surveillance program. It is not intended to diagnose MRSA infection nor to guide or monitor treatment for MRSA infections.          Studies: Dg Chest 2 View  06/20/2015  CLINICAL DATA:  Altered mental status with fever and vomiting for 1 day. EXAM: CHEST  2 VIEW COMPARISON:  Radiographs and CT 05/20/2015 FINDINGS: Lung volumes are low. Unchanged cardiomediastinal contours with mild cardiomegaly. Coronary stent in place. Linear atelectasis or scarring in the lingula. No consolidation to suggest pneumonia. No pleural effusion or pneumothorax. Left rib fractures on prior exam are partially seen. Kyphoplasty in the thoracolumbar spine. IMPRESSION: Linear atelectasis or scarring in the lingula. No acute process or change from prior. Electronically Signed   By: Rubye Oaks M.D.   On: 06/20/2015 00:33   Ct Head Wo Contrast  06/19/2015  CLINICAL DATA:  Fever today. Vomiting beginning last night.  Altered mental status. EXAM: CT HEAD WITHOUT CONTRAST TECHNIQUE: Contiguous axial images were obtained from the base of the skull through the vertex without intravenous contrast. COMPARISON:  Brain MRI 05/20/2015. FINDINGS: There is cortical atrophy and chronic microvascular ischemic change. No evidence of acute abnormality including infarct, hemorrhage, mass lesion, mass effect, midline shift or abnormal extra-axial fluid collections identified. No hydrocephalus or pneumocephalus. The calvarium is intact. IMPRESSION: No acute abnormality. Atrophy and chronic microvascular ischemic change. Electronically Signed   By: Drusilla Kanner M.D.   On: 06/19/2015 20:59   Ct Chest W Contrast  06/20/2015  CLINICAL DATA:  Acute onset of lethargy. Vomiting. Hyperglycemia. Initial encounter. EXAM: CT CHEST, ABDOMEN, AND PELVIS WITH CONTRAST TECHNIQUE: Multidetector CT imaging of the chest, abdomen and pelvis was performed following the standard protocol during bolus administration of intravenous contrast. CONTRAST:  OMNIPAQUE IOHEXOL 300 MG/ML  SOLN COMPARISON:  CTA of the chest performed 05/20/2015, and MRI of the thoracic and lumbar spine performed 02/10/2011 FINDINGS: CT CHEST Bilateral atelectasis or scarring is noted. Minimal nodular opacity is noted at the superior aspect of the left lower lobe. This could reflect a mild infectious process. The lungs are otherwise clear. No pleural effusion or pneumothorax is seen. No suspicious masses are identified. Diffuse coronary artery calcifications are seen. Scattered calcification is noted along the thoracic aorta. No mediastinal lymphadenopathy is seen. No pericardial effusion is identified. The great vessels are grossly unremarkable in appearance. The thyroid gland is unremarkable in appearance. No axillary lymphadenopathy is seen. No acute osseous abnormalities are identified. Cervical spinal fusion hardware is noted. CT ABDOMEN AND PELVIS The liver and spleen are  unremarkable in appearance. The gallbladder is within normal limits. The pancreas and adrenal glands are unremarkable. The kidneys are unremarkable in appearance. There is no evidence of hydronephrosis. No renal or ureteral stones are seen. No perinephric stranding is appreciated. No free fluid is identified. The small bowel is unremarkable in appearance. The stomach is within normal limits. No acute vascular abnormalities are seen. Diffuse calcification is noted along the abdominal aorta and its branches. The patient is status post appendectomy. The colon is largely decompressed, aside from a relatively large amount of stool in the rectum. The bladder is mildly distended and grossly unremarkable. The patient is status post hysterectomy. No suspicious adnexal masses are seen. No inguinal lymphadenopathy is seen. No acute osseous abnormalities are identified. The patient is status post vertebroplasty at L1. IMPRESSION: 1. No acute abnormality seen to explain the patient's symptoms. 2. Minimal nodular opacity at the superior aspect of the left lower lung lobe. This could reflect a mild infectious process. Bilateral atelectasis or scarring noted. 3. Diffuse coronary artery calcifications seen. 4. Diffuse calcification along the abdominal aorta and its branches. 5. Relatively large amount of stool in the rectum. Colon otherwise largely decompressed. Electronically Signed   By: Roanna RaiderJeffery  Chang M.D.   On: 06/20/2015 23:45   Ct Abdomen Pelvis W Contrast  06/20/2015  CLINICAL DATA:  Acute onset of lethargy. Vomiting. Hyperglycemia. Initial encounter. EXAM: CT CHEST, ABDOMEN, AND PELVIS WITH CONTRAST TECHNIQUE: Multidetector CT imaging of the chest, abdomen and pelvis was performed following the standard protocol during bolus administration of intravenous contrast. CONTRAST:  100mL OMNIPAQUE IOHEXOL 300 MG/ML  SOLN COMPARISON:  CTA of the chest performed 05/20/2015, and MRI of the thoracic and lumbar spine performed  02/10/2011 FINDINGS: CT CHEST Bilateral atelectasis or scarring is noted. Minimal nodular opacity is noted at the superior aspect of the left lower lobe. This could reflect a mild infectious process. The lungs are otherwise clear. No pleural effusion or pneumothorax is seen. No suspicious masses are identified. Diffuse coronary artery calcifications are seen. Scattered calcification is noted along the thoracic aorta. No mediastinal lymphadenopathy is seen. No pericardial effusion is identified. The great vessels are grossly unremarkable in appearance. The thyroid gland is unremarkable in appearance. No axillary lymphadenopathy is seen. No acute osseous abnormalities are identified. Cervical spinal fusion hardware is noted. CT ABDOMEN AND PELVIS The liver and spleen are unremarkable in appearance. The gallbladder is within normal limits. The pancreas and adrenal glands are unremarkable. The kidneys are unremarkable in appearance. There is no evidence of hydronephrosis. No renal or ureteral stones are seen. No perinephric stranding is appreciated. No free fluid is identified. The small bowel is unremarkable in appearance. The stomach is within normal limits. No acute vascular  abnormalities are seen. Diffuse calcification is noted along the abdominal aorta and its branches. The patient is status post appendectomy. The colon is largely decompressed, aside from a relatively large amount of stool in the rectum. The bladder is mildly distended and grossly unremarkable. The patient is status post hysterectomy. No suspicious adnexal masses are seen. No inguinal lymphadenopathy is seen. No acute osseous abnormalities are identified. The patient is status post vertebroplasty at L1. IMPRESSION: 1. No acute abnormality seen to explain the patient's symptoms. 2. Minimal nodular opacity at the superior aspect of the left lower lung lobe. This could reflect a mild infectious process. Bilateral atelectasis or scarring noted. 3.  Diffuse coronary artery calcifications seen. 4. Diffuse calcification along the abdominal aorta and its branches. 5. Relatively large amount of stool in the rectum. Colon otherwise largely decompressed. Electronically Signed   By: Roanna Raider M.D.   On: 06/20/2015 23:45        Scheduled Meds: . sodium chloride   Intravenous STAT  . amLODipine  10 mg Oral Daily  . ARIPiprazole  5 mg Oral q morning - 10a  . aspirin EC  81 mg Oral Daily  . donepezil  10 mg Oral QHS  . feeding supplement (ENSURE ENLIVE)  237 mL Oral BID BM  . ferrous sulfate  325 mg Oral BID  . heparin  5,000 Units Subcutaneous 3 times per day  . lamoTRIgine  50 mg Oral QHS  . LORazepam  0.5 mg Oral QHS  . multivitamin with minerals  1 tablet Oral Daily  . pantoprazole  40 mg Oral BID  . prazosin  1 mg Oral QHS  . rosuvastatin  5 mg Oral q1800  . senna-docusate  2 tablet Oral BID  . sertraline  150 mg Oral Daily   Continuous Infusions: . sodium chloride 125 mL/hr at 06/21/15 0227  . dextrose 5 % and 0.45% NaCl 125 mL/hr at 06/21/15 0851  . insulin (NOVOLIN-R) infusion 1.1 Units/hr (06/21/15 0906)    Principal Problem:   Diabetic ketoacidosis without coma associated with type 1 diabetes mellitus (HCC) Active Problems:   DM (diabetes mellitus), type 1 with complications (HCC)   Pressure ulcer    Time spent: 40 minutes    Trenda Corliss, MD, FACP, FHM. [facility] Pager [phone]  If 7PM-7AM, please contact night-coverage [email or web address] Password [email or web address] 06/21/2015, 11:16 AM    LOS: 0 days

## 2015-06-21 NOTE — Progress Notes (Signed)
Patient has MOST form in chart with confirmed DNR requested; verified with patient's legal guardian, [name]

## 2015-06-21 NOTE — ED Notes (Signed)
No change to insulin drip after cbg check of 167. glucostablizer stays at 1.1 units/hr.

## 2015-06-21 NOTE — ED Notes (Signed)
cbg is 151. 

## 2015-06-21 NOTE — ED Notes (Signed)
cbg is 167

## 2015-06-21 NOTE — ED Notes (Signed)
Lyda PeroneJared Gardner, MD at bedside at this time.

## 2015-06-21 NOTE — ED Notes (Signed)
This RN went in to start fluids and patient states she does not feel good.  Back of bed lifted, and patient provided with emesis bag.  Pt asked RN to "help me" by sticking her finger down her throat.  This RN offered medication.  Will speak to MD

## 2015-06-22 DIAGNOSIS — E876 Hypokalemia: Secondary | ICD-10-CM

## 2015-06-22 DIAGNOSIS — F0391 Unspecified dementia with behavioral disturbance: Secondary | ICD-10-CM

## 2015-06-22 LAB — COMPREHENSIVE METABOLIC PANEL
ALBUMIN: 2.8 g/dL — AB (ref 3.5–5.0)
ALK PHOS: 73 U/L (ref 38–126)
ALT: 41 U/L (ref 14–54)
ANION GAP: 8 (ref 5–15)
AST: 56 U/L — AB (ref 15–41)
BILIRUBIN TOTAL: 0.9 mg/dL (ref 0.3–1.2)
CALCIUM: 9 mg/dL (ref 8.9–10.3)
CO2: 25 mmol/L (ref 22–32)
CREATININE: 0.64 mg/dL (ref 0.44–1.00)
Chloride: 104 mmol/L (ref 101–111)
GFR calc Af Amer: >60 mL/min (ref >60)
GFR calc non Af Amer: >60 mL/min (ref >60)
GLUCOSE: 81 mg/dL (ref 65–99)
Potassium: 3.1 mmol/L — ABNORMAL LOW (ref 3.5–5.1)
SODIUM: 137 mmol/L (ref 135–145)
TOTAL PROTEIN: 5.1 g/dL — AB (ref 6.5–8.1)

## 2015-06-22 LAB — GLUCOSE, CAPILLARY
GLUCOSE-CAPILLARY: 72 mg/dL (ref 65–99)
GLUCOSE-CAPILLARY: 151 mg/dL — AB (ref 65–99)
Glucose-Capillary: 102 mg/dL — ABNORMAL HIGH (ref 65–99)
Glucose-Capillary: 81 mg/dL (ref 65–99)

## 2015-06-22 LAB — MAGNESIUM: Magnesium: 1.7 mg/dL (ref 1.7–2.4)

## 2015-06-22 MED ORDER — BISACODYL 10 MG RE SUPP: 10.0000 mg | Freq: Every day | RECTAL | Status: DC | PRN

## 2015-06-22 MED ORDER — POTASSIUM CHLORIDE CRYS ER 20 MEQ PO TBCR
40.0000 meq | EXTENDED_RELEASE_TABLET | ORAL | Status: AC
  Administered 2015-06-22 (×2): 40 meq via ORAL
  Filled 2015-06-22 (×3): qty 2

## 2015-06-22 NOTE — Progress Notes (Signed)
Notified Md about lab results of potassium of 3.1.  Will continue to monitor Karena AddisonCoro, Geneve Kimpel T

## 2015-06-22 NOTE — Progress Notes (Signed)
PROGRESS NOTE    Beth Payne YNW:[id number] DOB: 01-25-1941 DOA: 06/20/2015 PCP: [name], MD  HPI/Brief narrative 75 year old female patient with history of type I DM, prior episodes of DKA admits, dementia, CAD, depression, anxiety, TIA, GERD presented to [facility] ED on 06/20/15 with complaints of nausea and vomiting and found to be in DKA. CT head, chest, abdomen and pelvis done day prior showed constipation but nothing acute. Admitted to stepdown for further management. DKA resolved. Transferring to medical bed 1/15 and possible DC to SNF 1/16.   Assessment/Plan:   DKA in type I DM, not at goal - Hemoglobin A1c 05/20/15:8.2. - Admitted to stepdown unit and treated per DKA protocol with aggressive IV fluids, insulin drip, frequent BMP monitoring. - After DKA resolved, transitioned to Levemir at pta doses and SSI. - This might have been separated by acute viral GE +/- medication noncompliance. - good control  Dementia with behavioral disturbance - On 1/14, agitated, hitting out at staff and trying to get out of bed. - Secondary to hospitalization and metabolic derangement complicating underlying possible advanced dementia. Wellsite geologist and when necessary IV Haldol 1 mg every 12 hours for agitation. Continue Aricept. Monitor - Agitation has resolved. Per RN, no sitter since 7 PM on 1/14. Monitor   Nausea and vomiting/possible acute viral GE - resolved. Resumed diet after DKA resolved. per RN, not eating much.   Dehydration - resolved.   Hypokalemia - Replace and follow. magnesium 1.7.   Acute kidney injury - Resolved.  Anemia - Stable.  Essential hypertension - Continue amlodipine & prazosin and monitor. better controlled.   Anxiety and depression - Continue Abilify, Zoloft and Ativan at prior home doses.  HLD - Statins.  Constipation - Started bowel regimen. Had BM on 1/14.    DVT prophylaxis: DVT prophylaxis Code Status: As per RN discussion with  healthcare power of attorney on 1/14 and bedside MOST form, patient is DO NOT RESUSCITATE which was confirmed by RN. Family Communication: None at bedside Disposition Plan: transfer to medical floor today. DC to SNF 1/16.   Consultants:  None  Procedures:  None  Antimicrobials:  None   Subjective: Pleasantly confused. Denies complaints. As per RN, no agitation and has been without a sitter since last night. No nausea, vomiting, pain or diarrhea. Tolerating diet but not eating much.   Objective: Filed Vitals:   06/21/15 2103 06/21/15 2319 06/22/15 0313 06/22/15 0729  BP: 152/71 123/59 141/62 128/67  Pulse:  90 78 73  Temp:  99.3 F (37.4 C) 99 F (37.2 C) 98.8 F (37.1 C)  TempSrc:  Oral Oral Axillary  Resp:  14 15 18   Height:      Weight:      SpO2:  98% 100% 99%    Intake/Output Summary (Last 24 hours) at 06/22/15 1139 Last data filed at 06/22/15 1000  Gross per 24 hour  Intake 2671.86 ml  Output      0 ml  Net 2671.86 ml   Filed Weights   06/21/15 0620  Weight: 40.9 kg (90 lb 2.7 oz)    Exam:  General exam: Small built and frail pleasant elderly female lying comfortably in bed. Mucosa moist. Pleasant, smiling. Does not look anxious or agitated like she did yesterday morning. Respiratory system: Clear. No increased work of breathing. Cardiovascular system: S1 & S2 heard, RRR. No JVD, murmurs, gallops, clicks or pedal edema. Telemetry: SR. Gastrointestinal system: Abdomen is nondistended, soft and nontender. Normal bowel sounds heard.  Central nervous system: Alert and oriented only to self. No focal neurological deficits. Extremities: Symmetric 5 x 5 power.   Data Reviewed: Basic Metabolic Panel:  Recent Labs Lab 06/20/15 2215 06/21/15 0225 06/21/15 0527 06/21/15 1000 06/22/15 0305  NA 142 144 144 143 137  K 3.3* 3.3* 3.9 3.3* 3.1*  CL 104 110 108 107 104  CO2 16* 17* 17* 21* 25  GLUCOSE 282* 230* 174* 104* 81  BUN 18 14 14 7  <5*  CREATININE  1.27* 1.13* 0.94 0.85 0.64  CALCIUM 10.1 9.3 9.7 10.0 9.0  MG  --   --   --   --  1.7   Liver Function Tests:  Recent Labs Lab 06/19/15 2101 06/20/15 2215 06/22/15 0305  AST 54* 56* 56*  ALT 58* 57* 41  ALKPHOS 106 102 73  BILITOT 0.5 1.2 0.9  PROT 7.0 6.5 5.1*  ALBUMIN 4.1 3.8 2.8*    Recent Labs Lab 06/19/15 2101  LIPASE 23   No results for input(s): AMMONIA in the last 168 hours. CBC:  Recent Labs Lab 06/19/15 2101 06/20/15 2215  WBC 11.7* 13.5*  NEUTROABS 9.2* 11.6*  HGB 10.5* 10.8*  HCT 31.3* 32.4*  MCV 85.3 84.4  PLT 302 309   Cardiac Enzymes:  Recent Labs Lab 06/19/15 2101  CKTOTAL 376*   BNP (last 3 results) No results for input(s): PROBNP in the last 8760 hours. CBG:  Recent Labs Lab 06/21/15 1410 06/21/15 1514 06/21/15 1614 06/21/15 2138 06/22/15 0731  GLUCAP 176* 136* 135* 91 102*    Recent Results (from the past 240 hour(s))  Urine culture     Status: None   Collection Time: 06/19/15  8:51 PM  Result Value Ref Range Status   Specimen Description URINE, CATHETERIZED  Final   Special Requests NONE  Final   Culture   Final    NO GROWTH 1 DAY Performed at [facility]    Report Status 06/21/2015 FINAL  Final  MRSA PCR Screening     Status: None   Collection Time: 06/21/15  6:15 AM  Result Value Ref Range Status   MRSA by PCR NEGATIVE NEGATIVE Final    Comment:        The GeneXpert MRSA Assay (FDA approved for NASAL specimens only), is one component of a comprehensive MRSA colonization surveillance program. It is not intended to diagnose MRSA infection nor to guide or monitor treatment for MRSA infections.          Studies: Ct Chest W Contrast  06/20/2015  CLINICAL DATA:  Acute onset of lethargy. Vomiting. Hyperglycemia. Initial encounter. EXAM: CT CHEST, ABDOMEN, AND PELVIS WITH CONTRAST TECHNIQUE: Multidetector CT imaging of the chest, abdomen and pelvis was performed following the standard protocol during  bolus administration of intravenous contrast. CONTRAST:  OMNIPAQUE IOHEXOL 300 MG/ML  SOLN COMPARISON:  CTA of the chest performed 05/20/2015, and MRI of the thoracic and lumbar spine performed 02/10/2011 FINDINGS: CT CHEST Bilateral atelectasis or scarring is noted. Minimal nodular opacity is noted at the superior aspect of the left lower lobe. This could reflect a mild infectious process. The lungs are otherwise clear. No pleural effusion or pneumothorax is seen. No suspicious masses are identified. Diffuse coronary artery calcifications are seen. Scattered calcification is noted along the thoracic aorta. No mediastinal lymphadenopathy is seen. No pericardial effusion is identified. The great vessels are grossly unremarkable in appearance. The thyroid gland is unremarkable in appearance. No axillary lymphadenopathy is seen. No acute osseous abnormalities  are identified. Cervical spinal fusion hardware is noted. CT ABDOMEN AND PELVIS The liver and spleen are unremarkable in appearance. The gallbladder is within normal limits. The pancreas and adrenal glands are unremarkable. The kidneys are unremarkable in appearance. There is no evidence of hydronephrosis. No renal or ureteral stones are seen. No perinephric stranding is appreciated. No free fluid is identified. The small bowel is unremarkable in appearance. The stomach is within normal limits. No acute vascular abnormalities are seen. Diffuse calcification is noted along the abdominal aorta and its branches. The patient is status post appendectomy. The colon is largely decompressed, aside from a relatively large amount of stool in the rectum. The bladder is mildly distended and grossly unremarkable. The patient is status post hysterectomy. No suspicious adnexal masses are seen. No inguinal lymphadenopathy is seen. No acute osseous abnormalities are identified. The patient is status post vertebroplasty at L1. IMPRESSION: 1. No acute abnormality seen to  explain the patient's symptoms. 2. Minimal nodular opacity at the superior aspect of the left lower lung lobe. This could reflect a mild infectious process. Bilateral atelectasis or scarring noted. 3. Diffuse coronary artery calcifications seen. 4. Diffuse calcification along the abdominal aorta and its branches. 5. Relatively large amount of stool in the rectum. Colon otherwise largely decompressed. Electronically Signed   By: Roanna RaiderJeffery  Chang M.D.   On: 06/20/2015 23:45   Ct Abdomen Pelvis W Contrast  06/20/2015  CLINICAL DATA:  Acute onset of lethargy. Vomiting. Hyperglycemia. Initial encounter. EXAM: CT CHEST, ABDOMEN, AND PELVIS WITH CONTRAST TECHNIQUE: Multidetector CT imaging of the chest, abdomen and pelvis was performed following the standard protocol during bolus administration of intravenous contrast. CONTRAST:  100mL OMNIPAQUE IOHEXOL 300 MG/ML  SOLN COMPARISON:  CTA of the chest performed 05/20/2015, and MRI of the thoracic and lumbar spine performed 02/10/2011 FINDINGS: CT CHEST Bilateral atelectasis or scarring is noted. Minimal nodular opacity is noted at the superior aspect of the left lower lobe. This could reflect a mild infectious process. The lungs are otherwise clear. No pleural effusion or pneumothorax is seen. No suspicious masses are identified. Diffuse coronary artery calcifications are seen. Scattered calcification is noted along the thoracic aorta. No mediastinal lymphadenopathy is seen. No pericardial effusion is identified. The great vessels are grossly unremarkable in appearance. The thyroid gland is unremarkable in appearance. No axillary lymphadenopathy is seen. No acute osseous abnormalities are identified. Cervical spinal fusion hardware is noted. CT ABDOMEN AND PELVIS The liver and spleen are unremarkable in appearance. The gallbladder is within normal limits. The pancreas and adrenal glands are unremarkable. The kidneys are unremarkable in appearance. There is no evidence of  hydronephrosis. No renal or ureteral stones are seen. No perinephric stranding is appreciated. No free fluid is identified. The small bowel is unremarkable in appearance. The stomach is within normal limits. No acute vascular abnormalities are seen. Diffuse calcification is noted along the abdominal aorta and its branches. The patient is status post appendectomy. The colon is largely decompressed, aside from a relatively large amount of stool in the rectum. The bladder is mildly distended and grossly unremarkable. The patient is status post hysterectomy. No suspicious adnexal masses are seen. No inguinal lymphadenopathy is seen. No acute osseous abnormalities are identified. The patient is status post vertebroplasty at L1. IMPRESSION: 1. No acute abnormality seen to explain the patient's symptoms. 2. Minimal nodular opacity at the superior aspect of the left lower lung lobe. This could reflect a mild infectious process. Bilateral atelectasis or scarring noted. 3. Diffuse  coronary artery calcifications seen. 4. Diffuse calcification along the abdominal aorta and its branches. 5. Relatively large amount of stool in the rectum. Colon otherwise largely decompressed. Electronically Signed   By: Roanna Raider M.D.   On: 06/20/2015 23:45        Scheduled Meds: . amLODipine  10 mg Oral Daily  . ARIPiprazole  5 mg Oral q morning - 10a  . aspirin EC  81 mg Oral Daily  . donepezil  10 mg Oral QHS  . feeding supplement (ENSURE ENLIVE)  237 mL Oral BID BM  . ferrous sulfate  325 mg Oral BID  . heparin  5,000 Units Subcutaneous 3 times per day  . insulin aspart  0-5 Units Subcutaneous QHS  . insulin aspart  0-9 Units Subcutaneous TID WC  . insulin detemir  6 Units Subcutaneous BID  . lamoTRIgine  50 mg Oral QHS  . LORazepam  0.5 mg Oral QHS  . multivitamin with minerals  1 tablet Oral Daily  . pantoprazole  40 mg Oral BID  . polyethylene glycol  17 g Oral BID  . potassium chloride  40 mEq Oral Q4H  .  prazosin  1 mg Oral QHS  . rosuvastatin  5 mg Oral q1800  . senna-docusate  2 tablet Oral BID  . sertraline  150 mg Oral Daily   Continuous Infusions:    Principal Problem:   Diabetic ketoacidosis without coma associated with type 1 diabetes mellitus (HCC) Active Problems:   DM (diabetes mellitus), type 1 with complications (HCC)   Pressure ulcer    Time spent: 20 minutes    Tonee Silverstein, MD, FACP, FHM. [facility] Pager [phone]  If 7PM-7AM, please contact night-coverage [email or web address] Password [email or web address] 06/22/2015, 11:39 AM    LOS: 1 day

## 2015-06-22 NOTE — Progress Notes (Signed)
Patient transferred to floor from stepdown. [place]. Family at bedside.

## 2015-06-23 DIAGNOSIS — E11649 Type 2 diabetes mellitus with hypoglycemia without coma: Secondary | ICD-10-CM

## 2015-06-23 LAB — GLUCOSE, CAPILLARY
GLUCOSE-CAPILLARY: 39 mg/dL — AB (ref 65–99)
GLUCOSE-CAPILLARY: 100 mg/dL — AB (ref 65–99)
GLUCOSE-CAPILLARY: 322 mg/dL — AB (ref 65–99)
Glucose-Capillary: 184 mg/dL — ABNORMAL HIGH (ref 65–99)
Glucose-Capillary: 146 mg/dL — ABNORMAL HIGH (ref 65–99)

## 2015-06-23 LAB — BASIC METABOLIC PANEL
ANION GAP: 6 (ref 5–15)
BUN: 7 mg/dL (ref 6–20)
CALCIUM: 9.2 mg/dL (ref 8.9–10.3)
CO2: 24 mmol/L (ref 22–32)
Chloride: 108 mmol/L (ref 101–111)
Creatinine, Ser: 0.52 mg/dL (ref 0.44–1.00)
GFR calc Af Amer: >60 mL/min (ref >60)
GLUCOSE: 46 mg/dL — AB (ref 65–99)
POTASSIUM: 2.8 mmol/L — AB (ref 3.5–5.1)
SODIUM: 138 mmol/L (ref 135–145)

## 2015-06-23 MED ORDER — INSULIN DETEMIR 100 UNIT/ML ~~LOC~~ SOLN
6.0000 [IU] | Freq: Every day | SUBCUTANEOUS | Status: DC
  Administered 2015-06-23: 6 [IU] via SUBCUTANEOUS
  Filled 2015-06-23 (×2): qty 0.06

## 2015-06-23 MED ORDER — POTASSIUM CHLORIDE CRYS ER 20 MEQ PO TBCR
40.0000 meq | EXTENDED_RELEASE_TABLET | ORAL | Status: AC
  Administered 2015-06-23 (×2): 40 meq via ORAL
  Filled 2015-06-23 (×2): qty 2

## 2015-06-23 NOTE — Progress Notes (Signed)
Hypoglycemic Event  CBG: 38  Treatment: 15 GM carbohydrate snack  Symptoms: Pale  Follow-up CBG: Time:0800 CBG Result:100  Possible Reasons for Event: Inadequate meal intake and Medication regimen: Insulin Adjustments   Beth Payne, Beth Payne D

## 2015-06-23 NOTE — Evaluation (Signed)
Physical Therapy Evaluation Patient Details Name: Beth Payne MRN: [id number] DOB: [birth year] Today's Date: 06/23/2015   History of Present Illness  Pt admitted from [facility] with DKA. PMHx: dementia, DM, CAD, HTN  Clinical Impression  Pt pleasant and flat affect throughout. Pt states she walks a little with help and that staff help her get up. Pt disoriented to situation and requires cues for initiation and sequence of transfers. Pt with generalized weakness, impaired ability with transfers and gait currently receiving P.T. At [facility] with recommendation for return to SNF. Will follow acutely to maximize function and gait to decrease burden of care acutely.     Follow Up Recommendations SNF;Supervision for mobility/OOB    Equipment Recommendations  None recommended by PT    Recommendations for Other Services       Precautions / Restrictions Precautions Precautions: Fall Precaution Comments: incontinent Restrictions Weight Bearing Restrictions: No      Mobility  Bed Mobility               General bed mobility comments: in chair on arrival  Transfers Overall transfer level: Needs assistance   Transfers: Sit to/from Stand;Stand Pivot Transfers Sit to Stand: Mod assist Stand pivot transfers: Mod assist       General transfer comment: cues for hand placemetn with assist for anterior translation and elevation from surfaces. Pt stood from recliner and bSC  Ambulation/Gait Ambulation/Gait assistance: Mod assist Ambulation Distance (Feet): 20 Feet Assistive device: Rolling walker (2 wheeled) Gait Pattern/deviations: Shuffle;Trunk flexed;Narrow base of support   Gait velocity interpretation: Below normal speed for age/gender General Gait Details: cues for posture, position in RW and assist to direct RW as well as for balance due to posterior lean  Stairs            Wheelchair Mobility    Modified Rankin (Stroke Patients Only)       Balance  Overall balance assessment: Needs assistance   Sitting balance-Leahy Scale: Fair       Standing balance-Leahy Scale: Poor                               Pertinent Vitals/Pain Pain Assessment: No/denies pain    Home Living Family/patient expects to be discharged to:: Skilled nursing facility                      Prior Function Level of Independence: Needs assistance   Gait / Transfers Assistance Needed: pt reports she walks with assist and rollator limited distance  ADL's / Homemaking Assistance Needed: pt reports staff assists with all bathing and dressing        Hand Dominance        Extremity/Trunk Assessment   Upper Extremity Assessment: Generalized weakness           Lower Extremity Assessment: Generalized weakness      Cervical / Trunk Assessment: Kyphotic  Communication   Communication: No difficulties  Cognition Arousal/Alertness: Awake/alert Behavior During Therapy: Flat affect Overall Cognitive Status: No family/caregiver present to determine baseline cognitive functioning                      General Comments      Exercises        Assessment/Plan    PT Assessment Patient needs continued PT services  PT Diagnosis Difficulty walking;Generalized weakness;Altered mental status   PT Problem List Decreased strength;Decreased activity tolerance;Decreased balance;Decreased mobility;Decreased  knowledge of use of DME  PT Treatment Interventions DME instruction;Gait training;Functional mobility training;Therapeutic activities;Therapeutic exercise;Balance training;Patient/family education   PT Goals (Current goals can be found in the Care Plan section) Acute Rehab PT Goals Patient Stated Goal: go home PT Goal Formulation: With patient Time For Goal Achievement: 07/07/15 Potential to Achieve Goals: Fair    Frequency Min 2X/week   Barriers to discharge        Co-evaluation               End of Session    Activity Tolerance: Patient tolerated treatment well Patient left: in chair;with call bell/phone within reach Nurse Communication: Mobility status;Precautions         Time: [id number] PT Time Calculation (min) (ACUTE ONLY): 13 min   Charges:   PT Evaluation $PT Eval Moderate Complexity: 1 Procedure     PT G CodesDelorse Lek:        Tabor, Jrue Yambao Beth 06/23/2015, 11:59 AM Delaney MeigsMaija Tabor Celinda Dethlefs, PT [phone]

## 2015-06-23 NOTE — Progress Notes (Signed)
PROGRESS NOTE    Beth Payne ZOX:[id number] DOB: 02/13/1941 DOA: 06/20/2015 PCP: [name], MD  HPI/Brief narrative 75 year old female patient with history of type I DM, prior episodes of DKA admits, dementia, CAD, depression, anxiety, TIA, GERD presented to [facility] ED on 06/20/15 with complaints of nausea and vomiting and found to be in DKA. CT head, chest, abdomen and pelvis done day prior showed constipation but nothing acute. Admitted to stepdown for further management. DKA resolved. Transferring to medical bed 1/15. Developed hypoglycemia on 1/16 a.m.   Assessment/Plan:   DKA in type I DM, not at goal/hypoglycemia - Hemoglobin A1c 05/20/15:8.2. - Admitted to stepdown unit and treated per DKA protocol with aggressive IV fluids, insulin drip, frequent BMP monitoring. - After DKA resolved, transitioned to Levemir at pta doses and SSI. - This might have been precipitated by acute viral GE +/- medication noncompliance. - Hypoglycemic CBG at 39 mg per DL this morning. Concern for brittle diabetes. Reduced Levemir from 6 units twice a day to 6 units at bedtime. Continue NovoLog SSI. - Patient has had 7 hospital admissions since July 2016. Several of them were due to poorly controlled DM and DKA. - We'll attempt to discuss with patient's primary endocrinologist/Dr. Evlyn Kanner in a.m. regarding further management.  Dementia with behavioral disturbance - On 1/14, agitated, hitting out at staff and trying to get out of bed. - Secondary to hospitalization and metabolic derangement complicating underlying possible advanced dementia. Wellsite geologist and when necessary IV Haldol 1 mg every 12 hours for agitation. Continue Aricept. Monitor - Agitation has resolved. Per RN, no sitter since 7 PM on 1/14. Monitor   Nausea and vomiting/possible acute viral GE - resolved. Resumed diet after DKA resolved. per RN, not eating much.   Dehydration - resolved.   Hypokalemia - Replace and follow.  magnesium 1.7.   Acute kidney injury - Resolved.  Anemia - Stable.  Essential hypertension - Continue amlodipine & prazosin and monitor. better controlled.   Anxiety and depression - Continue Abilify, Zoloft and Ativan at prior home doses.  HLD - Statins.  Constipation - Started bowel regimen. Had BM on 1/14.   Lung lesion found on CT of the neck (left upper lung and right lower lung) - This was during previous hospitalization. Follow-up CT in 3 months as outpatient.   DVT prophylaxis: DVT prophylaxis Code Status: DO NOT RESUSCITATE Family Communication: Discussed with healthcare power of attorney Mr. [name] on 06/23/15 Disposition Plan: transfer to medical floor today. DC to SNF 1/16.   Consultants:  None  Procedures:  None  Antimicrobials:  None   Subjective: Hypoglycemia noted this morning. Patient denies complaints. Pleasantly confused.  Objective: Filed Vitals:   06/22/15 1711 06/22/15 2148 06/23/15 0506 06/23/15 1424  BP: 131/72 144/70 122/60 108/61  Pulse: 83 82 74 82  Temp: 98.8 F (37.1 C) 99.6 F (37.6 C) 98.4 F (36.9 C) 98.4 F (36.9 C)  TempSrc: Oral Oral Oral Oral  Resp: 18 17 15 17   Height:      Weight:      SpO2: 100% 98% 99% 100%    Intake/Output Summary (Last 24 hours) at 06/23/15 1709 Last data filed at 06/23/15 1425  Gross per 24 hour  Intake    720 ml  Output    200 ml  Net    520 ml   Filed Weights   06/21/15 0620  Weight: 40.9 kg (90 lb 2.7 oz)    Exam:  General exam: Small  built and frail pleasant elderly female sitting up comfortably in chair this morning. Respiratory system: Clear. No increased work of breathing. Cardiovascular system: S1 & S2 heard, RRR. No JVD, murmurs, gallops, clicks or pedal edema.  Gastrointestinal system: Abdomen is nondistended, soft and nontender. Normal bowel sounds heard. Central nervous system: Alert and oriented only to self. No focal neurological deficits. Extremities:  Symmetric 5 x 5 power.   Data Reviewed: Basic Metabolic Panel:  Recent Labs Lab 06/21/15 0225 06/21/15 0527 06/21/15 1000 06/22/15 0305 06/23/15 0649  NA 144 144 143 137 138  K 3.3* 3.9 3.3* 3.1* 2.8*  CL 110 108 107 104 108  CO2 17* 17* 21* 25 24  GLUCOSE 230* 174* 104* 81 46*  BUN 14 14 7  <5* 7  CREATININE 1.13* 0.94 0.85 0.64 0.52  CALCIUM 9.3 9.7 10.0 9.0 9.2  MG  --   --   --  1.7  --    Liver Function Tests:  Recent Labs Lab 06/19/15 2101 06/20/15 2215 06/22/15 0305  AST 54* 56* 56*  ALT 58* 57* 41  ALKPHOS 106 102 73  BILITOT 0.5 1.2 0.9  PROT 7.0 6.5 5.1*  ALBUMIN 4.1 3.8 2.8*    Recent Labs Lab 06/19/15 2101  LIPASE 23   No results for input(s): AMMONIA in the last 168 hours. CBC:  Recent Labs Lab 06/19/15 2101 06/20/15 2215  WBC 11.7* 13.5*  NEUTROABS 9.2* 11.6*  HGB 10.5* 10.8*  HCT 31.3* 32.4*  MCV 85.3 84.4  PLT 302 309   Cardiac Enzymes:  Recent Labs Lab 06/19/15 2101  CKTOTAL 376*   BNP (last 3 results) No results for input(s): PROBNP in the last 8760 hours. CBG:  Recent Labs Lab 06/22/15 1655 06/22/15 2119 06/23/15 0720 06/23/15 0804 06/23/15 1138  GLUCAP 72 151* 39* 100* 322*    Recent Results (from the past 240 hour(s))  Urine culture     Status: None   Collection Time: 06/19/15  8:51 PM  Result Value Ref Range Status   Specimen Description URINE, CATHETERIZED  Final   Special Requests NONE  Final   Culture   Final    NO GROWTH 1 DAY [facility]    Report Status 06/21/2015 FINAL  Final  MRSA PCR Screening     Status: None   Collection Time: 06/21/15  6:15 AM  Result Value Ref Range Status   MRSA by PCR NEGATIVE NEGATIVE Final    Comment:        The GeneXpert MRSA Assay (FDA approved for NASAL specimens only), is one component of a comprehensive MRSA colonization surveillance program. It is not intended to diagnose MRSA infection nor to guide or monitor treatment for MRSA  infections.          Studies: No results found.      Scheduled Meds: . amLODipine  10 mg Oral Daily  . ARIPiprazole  5 mg Oral q morning - 10a  . aspirin EC  81 mg Oral Daily  . donepezil  10 mg Oral QHS  . feeding supplement (ENSURE ENLIVE)  237 mL Oral BID BM  . ferrous sulfate  325 mg Oral BID  . heparin  5,000 Units Subcutaneous 3 times per day  . insulin aspart  0-9 Units Subcutaneous TID WC  . insulin detemir  6 Units Subcutaneous QHS  . lamoTRIgine  50 mg Oral QHS  . LORazepam  0.5 mg Oral QHS  . multivitamin with minerals  1 tablet Oral  Daily  . pantoprazole  40 mg Oral BID  . polyethylene glycol  17 g Oral BID  . prazosin  1 mg Oral QHS  . rosuvastatin  5 mg Oral q1800  . senna-docusate  2 tablet Oral BID  . sertraline  150 mg Oral Daily   Continuous Infusions:    Principal Problem:   Diabetic ketoacidosis without coma associated with type 1 diabetes mellitus (HCC) Active Problems:   DM (diabetes mellitus), type 1 with complications (HCC)   Pressure ulcer    Time spent: 20 minutes    Tevion Laforge, MD, FACP, FHM. [facility] Pager [phone]  If 7PM-7AM, please contact night-coverage [email or web address] Password [email or web address] 06/23/2015, 5:09 PM    LOS: 2 days

## 2015-06-24 DIAGNOSIS — E109 Type 1 diabetes mellitus without complications: Secondary | ICD-10-CM

## 2015-06-24 DIAGNOSIS — R627 Adult failure to thrive: Secondary | ICD-10-CM

## 2015-06-24 LAB — BASIC METABOLIC PANEL
ANION GAP: 8 (ref 5–15)
CO2: 27 mmol/L (ref 22–32)
Calcium: 9.5 mg/dL (ref 8.9–10.3)
Chloride: 103 mmol/L (ref 101–111)
Creatinine, Ser: 0.51 mg/dL (ref 0.44–1.00)
GFR calc Af Amer: >60 mL/min (ref >60)
GFR calc non Af Amer: >60 mL/min (ref >60)
GLUCOSE: 35 mg/dL — AB (ref 65–99)
POTASSIUM: 3.6 mmol/L (ref 3.5–5.1)
Sodium: 138 mmol/L (ref 135–145)

## 2015-06-24 LAB — MAGNESIUM
Magnesium: 1.6 mg/dL — ABNORMAL LOW (ref 1.7–2.4)
Magnesium: 1.7 mg/dL (ref 1.7–2.4)

## 2015-06-24 LAB — GLUCOSE, CAPILLARY
GLUCOSE-CAPILLARY: 27 mg/dL — AB (ref 65–99)
GLUCOSE-CAPILLARY: 59 mg/dL — AB (ref 65–99)
GLUCOSE-CAPILLARY: 101 mg/dL — AB (ref 65–99)
GLUCOSE-CAPILLARY: 387 mg/dL — AB (ref 65–99)
Glucose-Capillary: 270 mg/dL — ABNORMAL HIGH (ref 65–99)
Glucose-Capillary: 329 mg/dL — ABNORMAL HIGH (ref 65–99)

## 2015-06-24 MED ORDER — MAGNESIUM SULFATE 2 GM/50ML IV SOLN
2.0000 g | Freq: Once | INTRAVENOUS | Status: AC
Start: 2015-06-24 — End: 2015-06-24
  Administered 2015-06-24: 2 g via INTRAVENOUS
  Filled 2015-06-24 (×2): qty 50

## 2015-06-24 MED ORDER — INSULIN GLARGINE 100 UNIT/ML ~~LOC~~ SOLN
5.0000 [IU] | Freq: Every day | SUBCUTANEOUS | Status: DC
  Administered 2015-06-24 – 2015-06-25 (×2): 5 [IU] via SUBCUTANEOUS
  Filled 2015-06-24 (×2): qty 0.05

## 2015-06-24 MED ORDER — INSULIN ASPART 100 UNIT/ML ~~LOC~~ SOLN
0.0000 [IU] | Freq: Three times a day (TID) | SUBCUTANEOUS | Status: DC
  Administered 2015-06-24: 3 [IU] via SUBCUTANEOUS
  Administered 2015-06-25: 5 [IU] via SUBCUTANEOUS
  Administered 2015-06-25 (×2): 3 [IU] via SUBCUTANEOUS
  Administered 2015-06-26: 2 [IU] via SUBCUTANEOUS
  Administered 2015-06-26 – 2015-06-27 (×2): 1 [IU] via SUBCUTANEOUS

## 2015-06-24 NOTE — Progress Notes (Signed)
PROGRESS NOTE    Beth Payne:[id number] DOB: 1941-04-09 DOA: 06/20/2015 PCP: [name], MD  HPI/Brief narrative 75 year old female patient with history of type I DM/brittle diabetes, prior episodes of DKA admits, dementia, CAD, depression, anxiety, TIA, GERD presented to [facility] ED on 06/20/15 with complaints of nausea and vomiting and found to be in DKA. CT head, chest, abdomen and pelvis done day prior showed constipation but nothing acute. Admitted to stepdown for further management. DKA resolved. Transitioned to Levemir and SSI. Transferred to floor. Frequent episodes of hypoglycemia/hyperglycemia-brittle DM. Reviewed chart-7 hospital admissions since July 2016 and most of them related to her poor glycemic control. Discussed with her primary endocrinologist who made recommendations. Not stable for DC to SNF today.   Assessment/Plan:   DKA in type I DM, not at goal/hypoglycemia/Brittle DM - Hemoglobin A1c 05/20/15:8.2. - Admitted to stepdown unit and treated per DKA protocol with aggressive IV fluids, insulin drip, frequent BMP monitoring. - After DKA resolved, transitioned to Levemir at pta doses and SSI. - This might have been precipitated by acute viral GE +/- medication noncompliance. - Hypoglycemic CBG at 39 mg per DL on 8/11 AM. Concern for brittle diabetes. Reduced Levemir from 6 units twice a day to 6 units at bedtime. Continue NovoLog SSI. - Patient has had 7 hospital admissions since July 2016. Several of them were due to poorly controlled DM and DKA. - Despite reducing Levemir, again had hypoglycemic episode this morning with CBG of 27. - Discussed at length with patient's PCP/Primary endocrinologist Dr. [name] who also managed her diabetes prior to this admission at the SNF. 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 This is also complicated by patient's inconsistent and poor oral intake. He recommended changing  Levemir to Lantus at same dose for better 24-hour coverage and change NovoLog SSI to modified lower dose - Monitor with above changes.   Dementia with behavioral disturbance - On 1/14, agitated, hitting out at staff and trying to get out of bed. - Secondary to hospitalization and metabolic derangement complicating underlying possible advanced dementia. Wellsite geologist and when necessary IV Haldol 1 mg every 12 hours for agitation. Continue Aricept. Monitor - Agitation has resolved.   Nausea and vomiting/possible acute viral GE - resolved. Resumed diet after DKA resolved. per RN, not eating much.  - As per her endocrinologist, she does have some degree of gastroparesis.  Dehydration - resolved.   Hypokalemia/hypomagnesemia - Replaced. Magnesium 1.6. Replace and follow.   Acute kidney injury - Resolved.  Anemia - Stable.  Essential hypertension - Continue amlodipine & prazosin and monitor. better controlled.   Anxiety and depression - Continue Abilify, Zoloft and Ativan at prior home doses.  HLD - Statins.  Constipation - Started bowel regimen. Had BM on 1/14.   Lung lesion found on CT of the neck (left upper lung and right lower lung) - This was during previous hospitalization. Follow-up CT in 3 months as outpatient.  Adult failure to thrive - Secondary to brittle diabetes, recurrent hospitalizations complicating underlying dementia.? Consider palliative care consultation. - will get dietitian to consult to see if patient likes to eat anything in particular. Her oral intake has been very poor (per nurse's aide, patient ate 10, 10 and 25% at 3 meals yesterday.   DVT prophylaxis: DVT prophylaxis Code Status: DO NOT RESUSCITATE Family Communication: Discussed with healthcare power of attorney Mr. [name] on 06/23/15 Disposition Plan: DC to SNF when medically  stable. Not medically stable for discharge.   Consultants:  Discussed with patient's primary endocrinologist  on 06/24/15.  Procedures:  None  Antimicrobials:  None   Subjective: Hypoglycemia noted this morning. CBG 27. As per nurse's aide, poor oral intake. Patient states she has no appetite.  Objective: Filed Vitals:   06/22/15 2148 06/23/15 0506 06/23/15 1424 06/23/15 2132  BP: 144/70 122/60 108/61 169/56  Pulse: 82 74 82 79  Temp: 99.6 F (37.6 C) 98.4 F (36.9 C) 98.4 F (36.9 C) 98.9 F (37.2 C)  TempSrc: Oral Oral Oral Oral  Resp: Height:      Weight:      SpO2: 98% 99% 100% 100%    Intake/Output Summary (Last 24 hours) at 06/24/15 1310 Last data filed at 06/24/15 1121  Gross per 24 hour  Intake    720 ml  Output    950 ml  Net   -230 ml   Filed Weights   06/21/15 0620  Weight: 40.9 kg (90 lb 2.7 oz)    Exam:  General exam: Small built and frail pleasant elderly female sitting up comfortably in chair this morning. Respiratory system: Clear. No increased work of breathing. Cardiovascular system: S1 & S2 heard, RRR. No JVD, murmurs, gallops, clicks or pedal edema.  Gastrointestinal system: Abdomen is nondistended, soft and nontender. Normal bowel sounds heard. Central nervous system: Alert and oriented only to self. No focal neurological deficits. Extremities: Symmetric 5 x 5 power.   Data Reviewed: Basic Metabolic Panel:  Recent Labs Lab 06/21/15 0527 06/21/15 1000 06/22/15 0305 06/23/15 0649 06/24/15 0517  NA 144 143 137 138 138  K 3.9 3.3* 3.1* 2.8* 3.6  CL 108 107 104 108 103  CO2 17* 21* GLUCOSE 174* 104* 81 46* 35*  BUN 14 7 <5* 7 <5*  CREATININE 0.94 0.85 0.64 0.52 0.51  CALCIUM 9.7 10.0 9.0 9.2 9.5  MG  --   --  1.7  --  1.6*   Liver Function Tests:  Recent Labs Lab 06/19/15 2101 06/20/15 2215 06/22/15 0305  AST 54* 56* 56*  ALT 58* 57* 41  ALKPHOS 106 102 73  BILITOT 0.5 1.2 0.9  PROT 7.0 6.5 5.1*  ALBUMIN 4.1 3.8 2.8*    Recent Labs Lab 06/19/15 2101  LIPASE 23   No results for input(s): AMMONIA  in the last 168 hours. CBC:  Recent Labs Lab 06/19/15 2101 06/20/15 2215  WBC 11.7* 13.5*  NEUTROABS 9.2* 11.6*  HGB 10.5* 10.8*  HCT 31.3* 32.4*  MCV 85.3 84.4  PLT 302 309   Cardiac Enzymes:  Recent Labs Lab 06/19/15 2101  CKTOTAL 376*   BNP (last 3 results) No results for input(s): PROBNP in the last 8760 hours. CBG:  Recent Labs Lab 06/23/15 2135 06/24/15 0658 06/24/15 0718 06/24/15 0737 06/24/15 1124  GLUCAP 146* 27* 59* 101* 387*    Recent Results (from the past 240 hour(s))  Urine culture     Status: None   Collection Time: 06/19/15  8:51 PM  Result Value Ref Range Status   Specimen Description URINE, CATHETERIZED  Final   Special Requests NONE  Final   Culture   Final    NO GROWTH 1 DAY Performed at [facility]    Report Status 06/21/2015 FINAL  Final  MRSA PCR Screening     Status: None   Collection Time: 06/21/15  6:15 AM  Result Value Ref Range Status  MRSA by PCR NEGATIVE NEGATIVE Final    Comment:        The GeneXpert MRSA Assay (FDA approved for NASAL specimens only), is one component of a comprehensive MRSA colonization surveillance program. It is not intended to diagnose MRSA infection nor to guide or monitor treatment for MRSA infections.          Studies: No results found.      Scheduled Meds: . amLODipine  10 mg Oral Daily  . ARIPiprazole  5 mg Oral q morning - 10a  . aspirin EC  81 mg Oral Daily  . donepezil  10 mg Oral QHS  . feeding supplement (ENSURE ENLIVE)  237 mL Oral BID BM  . ferrous sulfate  325 mg Oral BID  . heparin  5,000 Units Subcutaneous 3 times per day  . insulin aspart  0-5 Units Subcutaneous TID WC  . insulin glargine  5 Units Subcutaneous QHS  . lamoTRIgine  50 mg Oral QHS  . LORazepam  0.5 mg Oral QHS  . multivitamin with minerals  1 tablet Oral Daily  . pantoprazole  40 mg Oral BID  . polyethylene glycol  17 g Oral BID  . prazosin  1 mg Oral QHS  . rosuvastatin  5 mg Oral q1800    . senna-docusate  2 tablet Oral BID  . sertraline  150 mg Oral Daily   Continuous Infusions:    Principal Problem:   Diabetic ketoacidosis without coma associated with type 1 diabetes mellitus (HCC) Active Problems:   DM (diabetes mellitus), type 1 with complications (HCC)   Pressure ulcer    Time spent: 20 minutes    HONGALGI,ANAND, MD, FACP, FHM. [facility] Pager [phone]  If 7PM-7AM, please contact night-coverage [email or web address] Password [email or web address] 06/24/2015, 1:10 PM    LOS: 3 days

## 2015-06-24 NOTE — Care Management Important Message (Signed)
Important Message  Patient Details  Name: Beth Payne MRN: [id number] Date of Birth: 02/02/1941   Medicare Important Message Given:  Yes    Oralia Rud Kaileia Flow 06/24/2015, 3:35 PM

## 2015-06-24 NOTE — Progress Notes (Signed)
Hypoglycemic Event  CBG: 27  Treatment: 15 GM carbohydrate snack  Symptoms: Pale  Follow-up CBG: Time:0737 CBG Result:101  Possible Reasons for Event: Inadequate meal intake      Beth Payne C

## 2015-06-24 NOTE — Progress Notes (Signed)
Pt had hypogylecemic  episodes this morning with a cbg of 27 at 0658hrs, orange juice given, blood sugar climbed back up to 101 when last checked

## 2015-06-24 NOTE — Clinical Documentation Improvement (Addendum)
Hospitalist  (Query responses must be documented in the progress notes and discharge summary, not on the CDI BPA.)  Query 1 of 2 Coding guidelines require that the attending physician document the Location, Stage and POA status of pressure ulcers.  If you agree with the nursing documentation regarding the patient's pressure ulcer, please document the assessment in the patient's current medical record.  Clinical Information/Indicators: [facility] Flowsheets, Assessment 06/21/15 - Pressure Ulcer, Stage 1, Buttocks, Present on Admission documented by Ronnette Juniper RN on 06/21/15 at 0759  Query 2 of 2  Possible Clinical Conditions:  - Severe Malnutrition  - Other type or severity of malnutrition  - Unable to clinically determine  Clinical Information/Indicators: Please refer to Registered Dietician Assessment by Cathie Hoops dated 06/25/15 at 11:49 "Underweight, Severe malnutrition in context of chronic illness" "Increase Ensure Enlive po to TID, each supplement provides 350 kcal and 20 grams of protein" "MVI daily"    Please exercise your independent, professional judgment when responding. A specific answer is not anticipated or expected.   Thank You, Jerral Ralph  RN BSN CCDS [phone] Health Information Management [place]

## 2015-06-24 NOTE — Progress Notes (Signed)
Inpatient Diabetes Program Recommendations  AACE/ADA: New Consensus Statement on Inpatient Glycemic Control (2015)  Target Ranges:  Prepandial:   less than 140 mg/dL      Peak postprandial:   less than 180 mg/dL (1-2 hours)      Critically ill patients:  140 - 180 mg/dL   Review of Glycemic Control:  Results for SHARDAI, STAR (MRN [id number]) as of 06/24/2015 11:15  Ref. Range 06/23/2015 08:04 06/23/2015 11:38 06/23/2015 17:23 06/23/2015 21:35 06/24/2015 06:58 06/24/2015 07:18 06/24/2015 07:37  Glucose-Capillary Latest Ref Range: 65-99 mg/dL 409 (H) 811 (H) 914 (H) 146 (H) 27 (LL) 59 (L) 101 (H)    Diabetes history: Type 1 diabetes Outpatient Diabetes medications: Novolog correction tid with meals, Levemir 6 units bid Current orders for Inpatient glycemic control:  Novolog sensitive tid with meals, Levemir 6 units daily  Inpatient Diabetes Program Recommendations:    Note low blood sugars.  Agree with decreased Levemir.  Consider changing Novolog correction scale to custom scale starting at 151 mg/dL.   151-200 mg/dL- 1 unit 782-956 mg/dL- 2 units 213-086 mg/dL-3 units 578-469 mg/dL-4 units 629-528 mg/dL- 5 units  Patient is sensitive to insulin and thus needs decreased doses of Novolog.  Intake is also decreased.    Will follow.  Thanks, Beryl Meager, RN, BC-ADM Inpatient Diabetes Coordinator Pager [phone] (8a-5p)

## 2015-06-24 NOTE — Progress Notes (Signed)
Pt's appetite very poor, did not eat lunch this pm

## 2015-06-25 DIAGNOSIS — E1010 Type 1 diabetes mellitus with ketoacidosis without coma: Principal | ICD-10-CM

## 2015-06-25 DIAGNOSIS — E108 Type 1 diabetes mellitus with unspecified complications: Secondary | ICD-10-CM

## 2015-06-25 LAB — BASIC METABOLIC PANEL
ANION GAP: 6 (ref 5–15)
BUN: 6 mg/dL (ref 6–20)
CHLORIDE: 98 mmol/L — AB (ref 101–111)
CO2: 29 mmol/L (ref 22–32)
Calcium: 9.4 mg/dL (ref 8.9–10.3)
Creatinine, Ser: 0.66 mg/dL (ref 0.44–1.00)
GFR calc non Af Amer: >60 mL/min (ref >60)
Glucose, Bld: 312 mg/dL — ABNORMAL HIGH (ref 65–99)
POTASSIUM: 4.2 mmol/L (ref 3.5–5.1)
Sodium: 133 mmol/L — ABNORMAL LOW (ref 135–145)

## 2015-06-25 LAB — GLUCOSE, CAPILLARY
GLUCOSE-CAPILLARY: 259 mg/dL — AB (ref 65–99)
GLUCOSE-CAPILLARY: 288 mg/dL — AB (ref 65–99)
GLUCOSE-CAPILLARY: 359 mg/dL — AB (ref 65–99)
Glucose-Capillary: 392 mg/dL — ABNORMAL HIGH (ref 65–99)

## 2015-06-25 MED ORDER — ENSURE ENLIVE PO LIQD
237.0000 mL | Freq: Three times a day (TID) | ORAL | Status: DC
  Administered 2015-06-25 – 2015-06-27 (×6): 237 mL via ORAL

## 2015-06-25 NOTE — Progress Notes (Signed)
Initial Nutrition Assessment  DOCUMENTATION CODES:   Underweight, Severe malnutrition in context of chronic illness  INTERVENTION:   -Increase Ensure Enlive po to TID, each supplement provides 350 kcal and 20 grams of protein -MVI daily  NUTRITION DIAGNOSIS:   Malnutrition related to chronic illness as evidenced by severe depletion of body fat, severe depletion of muscle mass, percent weight loss.  GOAL:   Patient will meet greater than or equal to 90% of their needs  MONITOR:   PO intake, Supplement acceptance, Diet advancement, Labs, Weight trends, Skin, I & O's  REASON FOR ASSESSMENT:   Consult Assessment of nutrition requirement/status  ASSESSMENT:   Beth Payne is a 75 y.o. female with h/o DM1, dementia, multiple admits for DKA. Patient presents to the ED with N/V, she had similar symptoms yesterday, but they have been persistent and worsening today. Yesterday she had a normal anion gap on labs, today it is elevated and she is in DKA.  Pt admitted with DKA. She is a resident of [facility].   Spoke with pt, who was sitting in the recliner at time of visit. She reports her appetite is chronically poor, revealing she consumed "1/16" of her breakfast (meal completion 10-50%). She denies any difficulty chewing or swallowing foods, however, just does not have the desire to eat at baseline. She enjoys consuming her Ensure Shakes, which she reports she also received prior to admission. Spoke with RN, who reports that pt consumes very little at meals, but takes her Ensure Enlive supplements well.   Pt endorses weight loss, however, unable to quantify time frame or amount of weight loss. Per documented wt hx, pt has experienced a 24# (21%) wt loss in the past 6 months, which is significant for time frame.   Reviewed endocrinology note from 06/25/15; with with difficult to control type 1 DM at baseline. Pt historically with low basal insulin needs, FTT, and gastroparesis.  Hgb A1c ranges from 6.6-8.2 (last Hgb A1c 8.2 on 05/20/15). Noted gradual increase in Hgb A1c.   Nutrition-Focused physical exam completed. Findings are severe depletion, severe depletion, and no edema. Noted that pt with thin, easily plucked hair on exam; this RD suspects protein and potential vitamin/mineral deficiencies.   Discussed importance of good meal and supplement intake to promote healing and prevent recurrences of hypoglycemia.   Labs reviewed: Na: 133, CBGS: 254-270, Glucose: 312.   Diet Order:  Diet Carb Modified Fluid consistency:: Thin; Room service appropriate?: Yes  Skin:  Wound (see comment) (stage 1 sacrum)  Last BM:  06/23/15  Height:   Ht Readings from Last 1 Encounters:  06/21/15  (1.575 m)    Weight:   Wt Readings from Last 1 Encounters:  06/21/15 90 lb 2.7 oz (40.9 kg)    Ideal Body Weight:  50 kg  BMI:  Body mass index is 16.49 kg/(m^2).  Estimated Nutritional Needs:   Kcal:  1200-1400  Protein:  50-60 grams  Fluid:  1.2-1.4 L  EDUCATION NEEDS:   Education needs addressed  Lea Walbert A. Mayford Knife, RD, LDN, CDE Pager: [phone] After hours Pager: [phone]

## 2015-06-25 NOTE — Progress Notes (Signed)
PROGRESS NOTE    Beth Payne ZOX:[id number] DOB: 01-11-1941 DOA: 06/20/2015 PCP: [name], MD  HPI/Brief narrative 75 year old female patient with history of type I DM/brittle diabetes, prior episodes of DKA admits, dementia, CAD, depression, anxiety, TIA, GERD presented to [facility] ED on 06/20/15 with complaints of nausea and vomiting and found to be in DKA. CT head, chest, abdomen and pelvis done day prior showed constipation but nothing acute. Admitted to stepdown for further management. DKA resolved. Transitioned to Levemir and SSI. Transferred to floor. Frequent episodes of hypoglycemia/hyperglycemia-brittle DM. Reviewed chart-7 hospital admissions since July 2016 and most of them related to her poor glycemic control. Discussed with her primary endocrinologist who made recommendations. Not stable for DC to SNF today.   Assessment/Plan:   DKA in type I DM, not at goal/hypoglycemia/Brittle DM - Hemoglobin A1c 05/20/15 is 8.2. - Admitted to stepdown unit and treated per DKA protocol with aggressive IV fluids, insulin drip, frequent BMP monitoring. - After DKA resolved, transitioned to Levemir at pta doses and SSI. - This might have been precipitated by acute viral GE +/- medication noncompliance. - Hypoglycemic CBG at 39 mg per DL on 8/11 AM. Concern for brittle diabetes. Reduced Levemir from 6 units twice a day to 6 units at bedtime. Continue NovoLog SSI. - Patient has had 7 hospital admissions since July 2016. Several of them were due to poorly controlled DM and DKA. - Despite reducing Levemir, again had hypoglycemic episode this morning with CBG of 27. - DR Hongalgi Discussed at length with patient's PCP/Primary endocrinologist Dr. [name] who also managed her diabetes prior to this admission at the SNF. 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 This is also complicated by patient's inconsistent and poor oral intake. He recommended  changing Levemir to Lantus at same dose for better 24-hour coverage and change NovoLog SSI to modified lower dose - No recurrence of hypoglycemia today , will continue with current dose Lantus. Dr. Evlyn Kanner recommendation, plan is to try to get enough basal insulin to prevent acidosis, but not too much to cause hypoglycemia, bicarbonate is 29 today.  Dementia with behavioral disturbance - On 1/14, agitated, hitting out at staff and trying to get out of bed. - Secondary to hospitalization and metabolic derangement complicating underlying possible advanced dementia. - Agitation has resolved.   Nausea and vomiting/possible acute viral GE - resolved. Resumed diet after DKA resolved. per RN, not eating much.  - As per her endocrinologist, she does have some degree of gastroparesis.  Dehydration - resolved.   Hypokalemia/hypomagnesemia - Repleted  Acute kidney injury - Resolved.  Anemia - Stable.  Essential hypertension - Continue amlodipine & prazosin and monitor. better controlled.   Anxiety and depression - Continue Abilify, Zoloft and Ativan at prior home doses.  HLD - Statins.  Constipation - Started bowel regimen. Had BM on 1/14.   Lung lesion found on CT of the neck (left upper lung and right lower lung) - This was during previous hospitalization. Follow-up CT in 3 months as outpatient.  Adult failure to thrive - Secondary to brittle diabetes, recurrent hospitalizations complicating underlying dementia.? Consider palliative care consultation. - will get dietitian to consult to see if patient likes to eat anything in particular. Her oral intake has been very poor (per nurse's aide, patient ate 10, 10 and 25% at 3 meals yesterday.   DVT prophylaxis: DVT prophylaxis Code Status: DO NOT RESUSCITATE Family Communication: none at bedside Disposition Plan:  DC to SNF when medically stable. Not medically stable for discharge.   Consultants:  Discussed with patient's primary  endocrinologist on 06/24/15.  Procedures:  None  Antimicrobials:  None   Subjective: Hypoglycemia noted this morning. CBG 27. As per nurse's aide, poor oral intake. Patient states she has no appetite.  Objective: Filed Vitals:   06/24/15 1351 06/24/15 2144 06/25/15 0444 06/25/15 1335  BP: 128/61 132/58 143/63 135/53  Pulse: 85 82 77 76  Temp: 98.4 F (36.9 C) 98.5 F (36.9 C) 98.1 F (36.7 C) 98.4 F (36.9 C)  TempSrc: Oral Oral Oral Oral  Resp: Height:      Weight:      SpO2: 98% 100% 99% 98%    Intake/Output Summary (Last 24 hours) at 06/25/15 1514 Last data filed at 06/25/15 1355  Gross per 24 hour  Intake 132775.83 ml  Output    100 ml  Net 132675.83 ml   Filed Weights   06/21/15 0620  Weight: 40.9 kg (90 lb 2.7 oz)    Exam:  General exam: Small built and frail pleasant elderly female sitting up comfortably in chair this morning. Respiratory system: Clear. No increased work of breathing. Cardiovascular system: S1 & S2 heard, RRR. No JVD, murmurs, gallops, clicks or pedal edema.  Gastrointestinal system: Abdomen is nondistended, soft and nontender. Normal bowel sounds heard. Central nervous system: Alert and oriented only to self. No focal neurological deficits. Extremities: Symmetric 5 x 5 power.   Data Reviewed: Basic Metabolic Panel:  Recent Labs Lab 06/21/15 1000 06/22/15 0305 06/23/15 0649 06/24/15 0517 06/24/15 1420 06/25/15 0548  NA 143 137 138 138  --  133*  K 3.3* 3.1* 2.8* 3.6  --  4.2  CL 107 104 108 103  --  98*  CO2 21* --  29  GLUCOSE 104* 81 46* 35*  --  312*  BUN 7 <5* 7 <5*  --  6  CREATININE 0.85 0.64 0.52 0.51  --  0.66  CALCIUM 10.0 9.0 9.2 9.5  --  9.4  MG  --  1.7  --  1.6* 1.7  --    Liver Function Tests:  Recent Labs Lab 06/19/15 2101 06/20/15 2215 06/22/15 0305  AST 54* 56* 56*  ALT 58* 57* 41  ALKPHOS 106 102 73  BILITOT 0.5 1.2 0.9  PROT 7.0 6.5 5.1*  ALBUMIN 4.1 3.8 2.8*     Recent Labs Lab 06/19/15 2101  LIPASE 23   No results for input(s): AMMONIA in the last 168 hours. CBC:  Recent Labs Lab 06/19/15 2101 06/20/15 2215  WBC 11.7* 13.5*  NEUTROABS 9.2* 11.6*  HGB 10.5* 10.8*  HCT 31.3* 32.4*  MCV 85.3 84.4  PLT 302 309   Cardiac Enzymes:  Recent Labs Lab 06/19/15 2101  CKTOTAL 376*   BNP (last 3 results) No results for input(s): PROBNP in the last 8760 hours. CBG:  Recent Labs Lab 06/24/15 1124 06/24/15 1707 06/24/15 2239 06/25/15 0732 06/25/15 1158  GLUCAP 387* 270* 329* 259* 288*    Recent Results (from the past 240 hour(s))  Urine culture     Status: None   Collection Time: 06/19/15  8:51 PM  Result Value Ref Range Status   Specimen Description URINE, CATHETERIZED  Final   Special Requests NONE  Final   Culture   Final    NO GROWTH 1 DAY Performed at [facility]    Report Status 06/21/2015 FINAL  Final  MRSA PCR Screening     Status: None   Collection Time: 06/21/15  6:15 AM  Result Value Ref Range Status   MRSA by PCR NEGATIVE NEGATIVE Final    Comment:        The GeneXpert MRSA Assay (FDA approved for NASAL specimens only), is one component of a comprehensive MRSA colonization surveillance program. It is not intended to diagnose MRSA infection nor to guide or monitor treatment for MRSA infections.          Studies: No results found.      Scheduled Meds: . amLODipine  10 mg Oral Daily  . ARIPiprazole  5 mg Oral q morning - 10a  . aspirin EC  81 mg Oral Daily  . donepezil  10 mg Oral QHS  . feeding supplement (ENSURE ENLIVE)  237 mL Oral TID BM  . ferrous sulfate  325 mg Oral BID  . heparin  5,000 Units Subcutaneous 3 times per day  . insulin aspart  0-5 Units Subcutaneous TID WC  . insulin glargine  5 Units Subcutaneous QHS  . lamoTRIgine  50 mg Oral QHS  . LORazepam  0.5 mg Oral QHS  . multivitamin with minerals  1 tablet Oral Daily  . pantoprazole  40 mg Oral BID  .  polyethylene glycol  17 g Oral BID  . prazosin  1 mg Oral QHS  . rosuvastatin  5 mg Oral q1800  . senna-docusate  2 tablet Oral BID  . sertraline  150 mg Oral Daily   Continuous Infusions:    Principal Problem:   Diabetic ketoacidosis without coma associated with type 1 diabetes mellitus (HCC) Active Problems:   DM (diabetes mellitus), type 1 with complications (HCC)   Pressure ulcer    Time spent: 20 minutes    ELGERGAWY, DAWOOD, MD. [facility] Pager [phone]  If 7PM-7AM, please contact night-coverage [email or web address] Password [email or web address] 06/25/2015, 3:14 PM    LOS: 4 days

## 2015-06-25 NOTE — Progress Notes (Signed)
Patient ID: Beth Payne, female   DOB: 1941/03/14, 75 y.o.   MRN: [id number] Feels a bit better this AM. No nausea. Got some sleep. Was quite weak when up with therapy yesterday FBS 329, bicarb fine. Mentating pretty well  DM 1 with recurrent DKA: very difficult situation. General FTT and gastroparesis along with low basal insulin needs are all challenges Now switched over to LANTUS once a day to try to get enough basal to prevent acidosis but not too much to cause lows Will check on sugars daily. Would keep LANTUS the same today but consider 1-2 unit increase Weds if needed  Assunta Curtis MD [facility]

## 2015-06-26 LAB — BASIC METABOLIC PANEL
Anion gap: 8 (ref 5–15)
BUN: 11 mg/dL (ref 6–20)
CHLORIDE: 97 mmol/L — AB (ref 101–111)
CO2: 31 mmol/L (ref 22–32)
CREATININE: 0.75 mg/dL (ref 0.44–1.00)
Calcium: 9.6 mg/dL (ref 8.9–10.3)
GFR calc Af Amer: >60 mL/min (ref >60)
GFR calc non Af Amer: >60 mL/min (ref >60)
GLUCOSE: 229 mg/dL — AB (ref 65–99)
Potassium: 3.9 mmol/L (ref 3.5–5.1)
Sodium: 136 mmol/L (ref 135–145)

## 2015-06-26 LAB — GLUCOSE, CAPILLARY
Glucose-Capillary: 317 mg/dL — ABNORMAL HIGH (ref 65–99)
Glucose-Capillary: 212 mg/dL — ABNORMAL HIGH (ref 65–99)
Glucose-Capillary: 141 mg/dL — ABNORMAL HIGH (ref 65–99)
Glucose-Capillary: 175 mg/dL — ABNORMAL HIGH (ref 65–99)
Glucose-Capillary: 228 mg/dL — ABNORMAL HIGH (ref 65–99)

## 2015-06-26 MED ORDER — INSULIN GLARGINE 100 UNIT/ML ~~LOC~~ SOLN
6.0000 [IU] | Freq: Every day | SUBCUTANEOUS | Status: DC
Start: 2015-06-26 — End: 2015-06-27
  Administered 2015-06-26: 6 [IU] via SUBCUTANEOUS
  Filled 2015-06-26 (×2): qty 0.06

## 2015-06-26 MED ORDER — INSULIN GLARGINE 100 UNIT/ML ~~LOC~~ SOLN
7.0000 [IU] | Freq: Every day | SUBCUTANEOUS | Status: DC
  Filled 2015-06-26: qty 0.07

## 2015-06-26 MED ORDER — INSULIN GLARGINE 100 UNIT/ML ~~LOC~~ SOLN: 2.0000 [IU] | Freq: Once | SUBCUTANEOUS | Status: DC

## 2015-06-26 MED ORDER — INSULIN GLARGINE 100 UNIT/ML ~~LOC~~ SOLN
2.0000 [IU] | SUBCUTANEOUS | Status: AC
  Administered 2015-06-26: 2 [IU] via SUBCUTANEOUS
  Filled 2015-06-26: qty 0.02

## 2015-06-26 NOTE — Clinical Social Work Note (Signed)
Clinical Social Work Assessment  Patient Details  Name: Beth Payne MRN: [id number] Date of Birth: [birth year]  Date of referral:  06/26/15               Reason for consult:  Facility Placement                Permission sought to share information with:  Family Supports Permission granted to share information::  No  Name::     Beth Payne [phone]  Agency::     Relationship::     Contact Information:     Housing/Transportation Living arrangements for the past 2 months:  Skilled Nursing Facility Source of Information:  Patient Patient Interpreter Needed:  None Criminal Activity/Legal Involvement Pertinent to Current Situation/Hospitalization:  No - Comment as needed Significant Relationships:  Other Family Members Lives with:  Facility Resident Do you feel safe going back to the place where you live?  Yes Need for family participation in patient care:  Yes (Comment)  Care giving concerns:  Patient admitted from [facility]   Social Worker assessment / plan:  CSW spoke with HCPOA- Beth Payne who reports she has been at [facility] for a few weeks-  He is wanting her to return and I have spoken with SNF rep and hopeful for bed (depending on when she is released)  Employment status:  Retired Health and safety inspector:  Medicare PT Recommendations:  Skilled Nursing Facility Information / Referral to community resources:  Skilled Nursing Facility  Patient/Family's Response to care:  Good  Patient/Family's Understanding of and Emotional Response to Diagnosis, Current Treatment, and Prognosis:  optimistic  Emotional Assessment Appearance:  Developmentally appropriate Attitude/Demeanor/Rapport:  Unable to Assess Affect (typically observed):  Unable to Assess Orientation:  Fluctuating Orientation (Suspected and/or reported Sundowners) Alcohol / Substance use:  Not Applicable Psych involvement (Current and /or in the community):  No (Comment)  Discharge Needs  Concerns to be  addressed:  Discharge Planning Concerns Readmission within the last 30 days:  No Current discharge risk:  None Barriers to Discharge:  No Barriers Identified   Beth Cline, LCSW 06/26/2015, 1:22 PM

## 2015-06-26 NOTE — NC FL2 (Signed)
[name] FL2 LEVEL OF CARE SCREENING TOOL     IDENTIFICATION  Patient Name: Beth Payne Birthdate: 1940-10-28 Sex: female Admission Date (Current Location): 06/20/2015  [facility] and [place] Number:  Producer, television/film/video and Address:  [facility] [place], [address] [place], [place] [address]      Provider Number: [id number]  Attending Physician Name and Address:  Starleen Arms, MD  Relative Name and Phone Number:       Current Level of Care: Hospital Recommended Level of Care: Skilled Nursing Facility Prior Approval Number:    Date Approved/Denied:   PASRR Number:    Discharge Plan: SNF    Current Diagnoses: Patient Active Problem List   Diagnosis Date Noted  . Pressure ulcer 06/21/2015  . Weakness generalized 05/20/2015  . Rhabdomyolysis 05/20/2015  . Lung mass 05/20/2015  . Generalized weakness 05/20/2015  . Hematemesis 04/24/2015  . Hypokalemia 04/24/2015  . SIRS (systemic inflammatory response syndrome) (HCC) 02/23/2015  . AKI (acute kidney injury) (HCC) 02/23/2015  . Acute encephalopathy 01/01/2015  . Atelectasis of left lung 01/01/2015  . Hyperglycemia   . Fracture, ankle 12/23/2014  . DM (diabetes mellitus), type 1 with complications (HCC) 12/17/2014  . NSTEMI (non-ST elevated myocardial infarction) (HCC) 12/13/2014  . CAD in native artery   . Pulmonary hypertension (HCC)   . Symptomatic bradycardia   . GERD (gastroesophageal reflux disease) 12/12/2014  . CAD s/p stent to mid-RCA in 2001. 12/12/2014  . Osteoarthritis 12/12/2014  . Systolic murmur 12/12/2014  . Normocytic anemia 12/12/2014  . Dyslipidemia 12/12/2014  . Diabetic ketoacidosis without coma associated with type 1 diabetes mellitus (HCC)     Orientation RESPIRATION BLADDER Height & Weight    Self, Time, Situation, Place    Incontinent  (157.5 cm) 90 lbs.  BEHAVIORAL SYMPTOMS/MOOD NEUROLOGICAL BOWEL NUTRITION STATUS      Incontinent  (carb  modified)  AMBULATORY STATUS COMMUNICATION OF NEEDS Skin   Limited Assist Verbally PU Stage and Appropriate Care                       Personal Care Assistance Level of Assistance  Bathing, Dressing           Functional Limitations Info             SPECIAL CARE FACTORS FREQUENCY  PT (By licensed PT), OT (By licensed OT)     PT Frequency: 5 OT Frequency: 5            Contractures      Additional Factors Info  Code Status, Allergies, Psychotropic, Insulin Sliding Scale               Current Medications (06/26/2015):  This is the current hospital active medication list Current Facility-Administered Medications  Medication Dose Route Frequency Provider Last Rate Last Dose  . acetaminophen (TYLENOL) tablet 650 mg  650 mg Oral Q6H PRN Hillary Bow, DO      . amLODipine (NORVASC) tablet 10 mg  10 mg Oral Daily Hillary Bow, DO   10 mg at 06/26/15 1027  . ARIPiprazole (ABILIFY) tablet 5 mg  5 mg Oral q morning - 10a Hillary Bow, DO   5 mg at 06/26/15 1027  . aspirin EC tablet 81 mg  81 mg Oral Daily Hillary Bow, DO   81 mg at 06/26/15 1026  . bisacodyl (DULCOLAX) suppository 10 mg  10 mg Rectal Daily PRN Theadora Rama  D Hongalgi, MD      . donepezil (ARICEPT) tablet 10 mg  10 mg Oral QHS Hillary Bow, DO   10 mg at 06/25/15 2158  . feeding supplement (ENSURE ENLIVE) (ENSURE ENLIVE) liquid 237 mL  237 mL Oral TID BM Jenifer A Williams, RD   237 mL at 06/26/15 1000  . ferrous sulfate tablet 325 mg  325 mg Oral BID Hillary Bow, DO   325 mg at 06/26/15 1027  . haloperidol lactate (HALDOL) injection 1 mg  1 mg Intravenous Q12H PRN Elease Etienne, MD   1 mg at 06/21/15 0853  . heparin injection 5,000 Units  5,000 Units Subcutaneous 3 times per day Hillary Bow, DO   5,000 Units at 06/26/15 0650  . insulin aspart (novoLOG) injection 0-5 Units  0-5 Units Subcutaneous TID WC Elease Etienne, MD   2 Units at 06/26/15 [phone]  . insulin glargine (LANTUS)  injection 7 Units  7 Units Subcutaneous QHS Leana Roe Elgergawy, MD      . lamoTRIgine (LAMICTAL) tablet 50 mg  50 mg Oral QHS Hillary Bow, DO   50 mg at 06/25/15 2157  . LORazepam (ATIVAN) tablet 0.5 mg  0.5 mg Oral QHS Hillary Bow, DO   0.5 mg at 06/25/15 2157  . multivitamin with minerals tablet 1 tablet  1 tablet Oral Daily Hillary Bow, DO   1 tablet at 06/25/15 1034  . ondansetron (ZOFRAN) injection 4 mg  4 mg Intravenous Q6H PRN Hillary Bow, DO   4 mg at 06/21/15 0112  . pantoprazole (PROTONIX) EC tablet 40 mg  40 mg Oral BID Hillary Bow, DO   40 mg at 06/26/15 1026  . polyethylene glycol (MIRALAX / GLYCOLAX) packet 17 g  17 g Oral BID Elease Etienne, MD   17 g at 06/25/15 2157  . prazosin (MINIPRESS) capsule 1 mg  1 mg Oral QHS Hillary Bow, DO   1 mg at 06/25/15 2158  . rosuvastatin (CRESTOR) tablet 5 mg  5 mg Oral q1800 Hillary Bow, DO   5 mg at 06/25/15 1715  . senna-docusate (Senokot-S) tablet 2 tablet  2 tablet Oral BID Hillary Bow, DO   2 tablet at 06/26/15 1026  . sertraline (ZOLOFT) tablet 150 mg  150 mg Oral Daily Hillary Bow, DO   150 mg at 06/26/15 1027  . traMADol (ULTRAM) tablet 50 mg  50 mg Oral Q6H PRN Hillary Bow, DO   50 mg at 06/25/15 1927     Discharge Medications: Please see discharge summary for a list of discharge medications.  Relevant Imaging Results:  Relevant Lab Results:   Additional Information SS#: [id number]  Liliana Cline, LCSW

## 2015-06-26 NOTE — Clinical Documentation Improvement (Addendum)
Hospitalist  (Query responses must be documented in the progress notes and discharge summary, not on the CDI BPA.)   "Pressure Ulcer" was documented in the hospitalist's progress note, initially on 06/21/15, and has been carried forward since.  Coding guidelines require that the attending physician document the Location, Stage and POA status of pressure ulcers.  If you agree with the nursing documentation regarding the patient's pressure ulcer, please document the assessment in the patient's current medical record.  Clinical Information/Indicators: [facility] Flowsheets, Assessment 06/21/15 - Pressure Ulcer, Stage 1, Buttocks, Present on Admission documented by Ronnette Juniper RN on 06/21/15 at 0759   Please exercise your independent, professional judgment when responding. A specific answer is not anticipated or expected.   Thank You, Jerral Ralph  RN BSN CCDS [phone] Health Information Management [place]

## 2015-06-26 NOTE — Progress Notes (Signed)
PROGRESS NOTE    DENAI CABA ZOX:[id number] DOB: [birth year] DOA: 06/20/2015 PCP: [name], MD  HPI/Brief narrative 75 year old female patient with history of type I DM/brittle diabetes, prior episodes of DKA admits, dementia, CAD, depression, anxiety, TIA, GERD presented to [facility] ED on 06/20/15 with complaints of nausea and vomiting and found to be in DKA. CT head, chest, abdomen and pelvis done day prior showed constipation but nothing acute. Admitted to stepdown for further management. DKA resolved. Transitioned to Levemir and SSI. Transferred to floor. Frequent episodes of hypoglycemia/hyperglycemia-brittle DM. Reviewed chart-7 hospital admissions since July 2016 and most of them related to her poor glycemic control.Dr Waymon Amato  Discussed with her primary endocrinologist who made recommendations.   Assessment/Plan:   DKA in type I DM, not at goal/hypoglycemia/Brittle DM - Hemoglobin A1c 05/20/15 is 8.2. - Admitted to stepdown unit and treated per DKA protocol with aggressive IV fluids, insulin drip, frequent BMP monitoring. - After DKA resolved, transitioned to Levemir at pta doses and SSI. - This might have been precipitated by acute viral GE +/- medication noncompliance. - Hypoglycemic CBG at 39 mg per DL on 8/11 AM. Concern for brittle diabetes. Reduced Levemir from 6 units twice a day to 6 units at bedtime. Continue NovoLog SSI. - Patient has had 7 hospital admissions since July 2016. Several of them were due to poorly controlled DM and DKA. - Despite reducing Levemir, again had hypoglycemic episode this morning with CBG of 27. - DR Hongalgi Discussed at length with patient's PCP/Primary endocrinologist Dr. [name] who also managed her diabetes prior to this admission at the SNF. 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 This is also complicated by patient's inconsistent and poor oral intake. He recommended changing Levemir to  Lantus at same dose for better 24-hour coverage and change NovoLog SSI to modified lower dose - No recurrence of hypoglycemia today , will continue with current dose Lantus. Dr. Evlyn Kanner recommendation, plan is to try to get enough basal insulin to prevent acidosis, but not too much to cause hypoglycemia, bicarbonate is 31 today. She did with uncontrolled CBG over last 24 hours, lantus increased from 5-6 units at bedtime, remains to have poor appetite today, encouraged to drink Glucerna.  Dementia with behavioral disturbance - On 1/14, agitated, hitting out at staff and trying to get out of bed. - Secondary to hospitalization and metabolic derangement complicating underlying possible advanced dementia. - Agitation has resolved.   Nausea and vomiting/possible acute viral GE - resolved. Resumed diet after DKA resolved. per RN, not eating much.  - As per her endocrinologist, she does have some degree of gastroparesis.  Dehydration - resolved.   Hypokalemia/hypomagnesemia - Repleted  Acute kidney injury - Resolved.  Anemia - Stable.  Essential hypertension - Continue amlodipine & prazosin and monitor. better controlled.   Anxiety and depression - Continue Abilify, Zoloft and Ativan at prior home doses.  HLD - Statins.  Constipation - Started bowel regimen. Had BM on 1/14.   Lung lesion found on CT of the neck (left upper lung and right lower lung) - This was during previous hospitalization. Follow-up CT in 3 months as outpatient.  Adult failure to thrive/severe protein calorie malnutrition - Secondary to brittle diabetes, recurrent hospitalizations complicating underlying dementia.? Consider palliative care consultation. - patient remains with poor oral intake , started on ensure and live by mouth 3 times a day , on ensure enlive TID  DVT prophylaxis: DVT prophylaxis Code  Status: DO NOT RESUSCITATE Family Communication: none at bedside Disposition Plan: DC to SNF when medically  stable. Not medically stable for discharge.   Consultants:  Discussed with patient's primary endocrinologist on 06/24/15.  Procedures:  None  Antimicrobials:  None   Subjective: Hypoglycemia noted this morning. CBG 27. As per nurse's aide, poor oral intake. Patient states she has no appetite.  Objective: Filed Vitals:   06/25/15 1335 06/25/15 2302 06/26/15 0522 06/26/15 1554  BP: 135/53 128/60 120/47 105/44  Pulse: 76 72 76 74  Temp: 98.4 F (36.9 C) 98.3 F (36.8 C) 98.2 F (36.8 C) 98 F (36.7 C)  TempSrc: Oral Oral Oral Oral  Resp: Height:      Weight:      SpO2: 98% 97% 98% 100%    Intake/Output Summary (Last 24 hours) at 06/26/15 1723 Last data filed at 06/26/15 1400  Gross per 24 hour  Intake    300 ml  Output      0 ml  Net    300 ml   Filed Weights   06/21/15 0620  Weight: 40.9 kg (90 lb 2.7 oz)    Exam:  General exam: Small built and frail pleasant elderly female sitting up comfortably in chair this morning. Respiratory system: Clear. No increased work of breathing. Cardiovascular system: S1 & S2 heard, RRR. No JVD, murmurs, gallops, clicks or pedal edema.  Gastrointestinal system: Abdomen is nondistended, soft and nontender. Normal bowel sounds heard. Central nervous system: Alert and oriented only to self. No focal neurological deficits. Extremities: Symmetric 5 x 5 power.   Data Reviewed: Basic Metabolic Panel:  Recent Labs Lab 06/22/15 0305 06/23/15 0649 06/24/15 0517 06/24/15 1420 06/25/15 0548 06/26/15 0833  NA 137 138 138  --  133* 136  K 3.1* 2.8* 3.6  --  4.2 3.9  CL 104 108 103  --  98* 97*  CO2 --  29 31  GLUCOSE 81 46* 35*  --  312* 229*  BUN <5* 7 <5*  --  6 11  CREATININE 0.64 0.52 0.51  --  0.66 0.75  CALCIUM 9.0 9.2 9.5  --  9.4 9.6  MG 1.7  --  1.6* 1.7  --   --    Liver Function Tests:  Recent Labs Lab 06/19/15 2101 06/20/15 2215 06/22/15 0305  AST 54* 56* 56*  ALT 58* 57* 41    ALKPHOS 106 102 73  BILITOT 0.5 1.2 0.9  PROT 7.0 6.5 5.1*  ALBUMIN 4.1 3.8 2.8*    Recent Labs Lab 06/19/15 2101  LIPASE 23   No results for input(s): AMMONIA in the last 168 hours. CBC:  Recent Labs Lab 06/19/15 2101 06/20/15 2215  WBC 11.7* 13.5*  NEUTROABS 9.2* 11.6*  HGB 10.5* 10.8*  HCT 31.3* 32.4*  MCV 85.3 84.4  PLT 302 309   Cardiac Enzymes:  Recent Labs Lab 06/19/15 2101  CKTOTAL 376*   BNP (last 3 results) No results for input(s): PROBNP in the last 8760 hours. CBG:  Recent Labs Lab 06/25/15 1644 06/25/15 2207 06/26/15 0655 06/26/15 0824 06/26/15 1218  GLUCAP 359* 392* 317* 212* 141*    Recent Results (from the past 240 hour(s))  Urine culture     Status: None   Collection Time: 06/19/15  8:51 PM  Result Value Ref Range Status   Specimen Description URINE, CATHETERIZED  Final   Special Requests NONE  Final   Culture   Final  NO GROWTH 1 DAY Performed at [facility]    Report Status 06/21/2015 FINAL  Final  MRSA PCR Screening     Status: None   Collection Time: 06/21/15  6:15 AM  Result Value Ref Range Status   MRSA by PCR NEGATIVE NEGATIVE Final    Comment:        The GeneXpert MRSA Assay (FDA approved for NASAL specimens only), is one component of a comprehensive MRSA colonization surveillance program. It is not intended to diagnose MRSA infection nor to guide or monitor treatment for MRSA infections.          Studies: No results found.      Scheduled Meds: . amLODipine  10 mg Oral Daily  . ARIPiprazole  5 mg Oral q morning - 10a  . aspirin EC  81 mg Oral Daily  . donepezil  10 mg Oral QHS  . feeding supplement (ENSURE ENLIVE)  237 mL Oral TID BM  . ferrous sulfate  325 mg Oral BID  . heparin  5,000 Units Subcutaneous 3 times per day  . insulin aspart  0-5 Units Subcutaneous TID WC  . insulin glargine  7 Units Subcutaneous QHS  . lamoTRIgine  50 mg Oral QHS  . LORazepam  0.5 mg Oral QHS  .  multivitamin with minerals  1 tablet Oral Daily  . pantoprazole  40 mg Oral BID  . polyethylene glycol  17 g Oral BID  . prazosin  1 mg Oral QHS  . rosuvastatin  5 mg Oral q1800  . senna-docusate  2 tablet Oral BID  . sertraline  150 mg Oral Daily   Continuous Infusions:    Principal Problem:   Diabetic ketoacidosis without coma associated with type 1 diabetes mellitus (HCC) Active Problems:   DM (diabetes mellitus), type 1 with complications (HCC)   Pressure ulcer    Time spent: 20 minutes    Ritchard Paragas, MD. [facility] Pager [phone]  If 7PM-7AM, please contact night-coverage [email or web address] Password [email or web address] 06/26/2015, 5:23 PM    LOS: 5 days

## 2015-06-27 LAB — GLUCOSE, CAPILLARY
GLUCOSE-CAPILLARY: 140 mg/dL — AB (ref 65–99)
GLUCOSE-CAPILLARY: 160 mg/dL — AB (ref 65–99)
GLUCOSE-CAPILLARY: 250 mg/dL — AB (ref 65–99)

## 2015-06-27 MED ORDER — BISACODYL 10 MG RE SUPP: 10.0000 mg | Freq: Every day | RECTAL | Status: DC | PRN

## 2015-06-27 MED ORDER — LORAZEPAM 0.5 MG PO TABS: 0.5000 mg | ORAL_TABLET | Freq: Every day | ORAL | Status: DC

## 2015-06-27 MED ORDER — ENSURE ENLIVE PO LIQD: 237.0000 mL | Freq: Three times a day (TID) | ORAL | Status: DC

## 2015-06-27 MED ORDER — INSULIN GLARGINE 100 UNIT/ML ~~LOC~~ SOLN: 6.0000 [IU] | Freq: Every day | SUBCUTANEOUS | Status: DC

## 2015-06-27 MED ORDER — INSULIN ASPART 100 UNIT/ML ~~LOC~~ SOLN: 0.0000 [IU] | Freq: Three times a day (TID) | SUBCUTANEOUS | Status: DC

## 2015-06-27 MED ORDER — TRAMADOL HCL 50 MG PO TABS: 50.0000 mg | ORAL_TABLET | Freq: Four times a day (QID) | ORAL | Status: DC | PRN

## 2015-06-27 NOTE — Clinical Social Work Placement (Signed)
   CLINICAL SOCIAL WORK PLACEMENT  NOTE  Date:  06/27/2015  Patient Details  Name: Beth Payne MRN: [id number] Date of Birth: [birth year]  Clinical Social Work is seeking post-discharge placement for this patient at the Skilled  Nursing Facility level of care (*CSW will initial, date and re-position this form in  chart as items are completed):  Yes   Patient/family provided with [facility] Clinical Social Work Department's list of facilities offering this level of care within the geographic area requested by the patient (or if unable, by the patient's family).  Yes   Patient/family informed of their freedom to choose among providers that offer the needed level of care, that participate in Medicare, Medicaid or managed care program needed by the patient, have an available bed and are willing to accept the patient.  Yes   Patient/family informed of [facility]'s ownership interest in [facility] and [facility], as well as of the fact that they are under no obligation to receive care at these facilities.  PASRR submitted to EDS on       PASRR number received on       Existing PASRR number confirmed on 06/27/15     FL2 transmitted to all facilities in geographic area requested by pt/family on       FL2 transmitted to all facilities within larger geographic area on       Patient informed that his/her managed care company has contracts with or will negotiate with certain facilities, including the following:        Yes   Patient/family informed of bed offers received.  Patient chooses bed at  [facility] )     Physician recommends and patient chooses bed at      Patient to be transferred to  [facility]) on 06/27/15.  Patient to be transferred to facility by [facility]     Patient family notified on 06/27/15 of transfer.  Name of family member notified:  [name]   [phone]     PHYSICIAN       Additional Comment:     _______________________________________________ Leron Croak 06/27/2015, 1:27 PM

## 2015-06-27 NOTE — Care Management Important Message (Signed)
Important Message  Patient Details  Name: Beth Payne MRN: [id number] Date of Birth: [birth year]   Medicare Important Message Given:  Yes    Laurita Peron P Hence Derrick 06/27/2015, 11:55 AM

## 2015-06-27 NOTE — Discharge Instructions (Signed)
Follow with Primary MD [name], MD in 7 days   Get CBC, CMP,  checked  by Primary MD next visit.    Activity: As tolerated with Full fall precautions use walker/cane & assistance as needed   Disposition SNF   Diet: Carbohydrate modified, with feeding assistance and aspiration precautions.  For Heart failure patients - Check your Weight same time everyday, if you gain over 2 pounds, or you develop in leg swelling, experience more shortness of breath or chest pain, call your Primary MD immediately. Follow Cardiac Low Salt Diet and 1.5 lit/day fluid restriction.   On your next visit with your primary care physician please Get Medicines reviewed and adjusted.   Please request your Prim.MD to go over all Hospital Tests and Procedure/Radiological results at the follow up, please get all Hospital records sent to your Prim MD by signing hospital release before you go home.   If you experience worsening of your admission symptoms, develop shortness of breath, life threatening emergency, suicidal or homicidal thoughts you must seek medical attention immediately by calling 911 or calling your MD immediately  if symptoms less severe.  You Must read complete instructions/literature along with all the possible adverse reactions/side effects for all the Medicines you take and that have been prescribed to you. Take any new Medicines after you have completely understood and accpet all the possible adverse reactions/side effects.   Do not drive, operating heavy machinery, perform activities at heights, swimming or participation in water activities or provide baby sitting services if your were admitted for syncope or siezures until you have seen by Primary MD or a Neurologist and advised to do so again.  Do not drive when taking Pain medications.    Do not take more than prescribed Pain, Sleep and Anxiety Medications  Special Instructions: If you have smoked or chewed Tobacco  in the last 2  yrs please stop smoking, stop any regular Alcohol  and or any Recreational drug use.  Wear Seat belts while driving.   Please note  You were cared for by a hospitalist during your hospital stay. If you have any questions about your discharge medications or the care you received while you were in the hospital after you are discharged, you can call the unit and asked to speak with the hospitalist on call if the hospitalist that took care of you is not available. Once you are discharged, your primary care physician will handle any further medical issues. Please note that NO REFILLS for any discharge medications will be authorized once you are discharged, as it is imperative that you return to your primary care physician (or establish a relationship with a primary care physician if you do not have one) for your aftercare needs so that they can reassess your need for medications and monitor your lab values.

## 2015-06-27 NOTE — Progress Notes (Signed)
Pt to be d/c today to [facility].  Pt and family agreeable. Confirmed plans with facility.  Plan transfer via EMS.    Leron Croak LCSWA  [facility]  [phone]

## 2015-06-27 NOTE — Discharge Summary (Signed)
CHALISA KOBLER, is a 75 y.o. female  DOB March 09, 1941  MRN [id number].  Admission date:  06/20/2015  Admitting Physician  Hillary Bow, DO  Discharge Date:  06/27/2015   Primary MD  [name], MD  Recommendations for primary care physician for things to follow:  - Please check CBC, BMP in 3 days. - Patient to follow with Dr. Evlyn Kanner in 1 week from discharge regarding further management of her diabetes mellitus. - Patient need follow-up CT chest in 3 months as an outpatient to follow on left lower lung nodule  CODE STATUS; DO NOT RESUSCITATE, cofirmed by POA Mr. Loletha Carrow  Admission Diagnosis  Vomiting [R11.10] Diabetic ketoacidosis without coma associated with type 1 diabetes mellitus (HCC) [E10.10]   Discharge Diagnosis  Vomiting [R11.10] Diabetic ketoacidosis without coma associated with type 1 diabetes mellitus (HCC) [E10.10]    Principal Problem:   Diabetic ketoacidosis without coma associated with type 1 diabetes mellitus (HCC) Active Problems:   DM (diabetes mellitus), type 1 with complications (HCC)   Pressure ulcer      Past Medical History  Diagnosis Date  . Coronary artery disease   . Depression   . Retinopathy   . Anxiety   . Heart murmur   . TIA (transient ischemic attack)   . Arthritis     "fingers" (12/23/2014)  . Fibromyalgia   . Type I diabetes mellitus (HCC) dx'd 1953    "I'm on a pup" (12/23/2014)  . Chronic lower back pain   . GERD (gastroesophageal reflux disease) dx'd 1995  . Osteopenia dx'd 2007  . Dementia     Past Surgical History  Procedure Laterality Date  . Cataract extraction w/ intraocular lens  implant, bilateral Bilateral 1998  . Tonsillectomy    . Appendectomy    . Bilateral carpal tunnel release Bilateral 2003-2004  . Back surgery    . Anterior cervical decomp/discectomy fusion  05/2003    "C5-6"  . Abdominal hysterectomy  1972  . Dilation  and curettage of uterus    . Cesarean section    . Eye surgery Bilateral since 1977    "numerous slaser surgeries for retinopathy"  . Trigger finger release Bilateral 2003  . Cervical disc surgery  2004    "collapsed C-5"  . Fracture surgery    . Patella fracture surgery Left 04/2007  . Vertebroplasty  01/2008    "compressed lumbar fracture"  . Laparoscopic lysis of adhesions  1975-1987 X 5  . Exploratory laparotomy  4098-1191 X 1    "adhesions"  . Oophorectomy Bilateral 1980's  . Vitrectomy Right 08/1997  . Bunionectomy with hammertoe reconstruction Right 02/1999  . Coronary angioplasty with stent placement  07/1999    "?1"  . Lumbar microdiscectomy  03/2005  . Orif ankle fracture Left 12/25/2014    Procedure: OPEN REDUCTION INTERNAL FIXATION (ORIF) LEFT TRIMALLEOLAR ANKLE FRACTURE;  Surgeon: Tarry Kos, MD;  Location: [facility] OR;  Service: Orthopedics;  Laterality: Left;       History of present illness and  Hospital Course:     Kindly see H&P for history of present illness and admission details, please review complete Labs, Consult reports and Test reports for all details in brief  HPI  from the history and physical done on the day of admission 06/21/2015 HPI: AMALIYA WHITELAW is a 75 y.o. female with h/o DM1, dementia, multiple admits for DKA. Patient presents to the ED with N/V, she had similar symptoms yesterday, but they have been persistent and worsening today. Yesterday she had a normal anion gap on labs, today it is elevated and she is in DKA.  Patient is unfortunately demented, and slightly altered, and not able to really give much of a great history.  CT head, chest, abd, pelvis, done yesterday showed constipation, and not much else acute.    Hospital Course  75 year old female patient with history of type I DM/brittle diabetes, prior episodes of DKA admits, dementia, CAD, depression, anxiety, TIA, GERD presented to [facility] ED on 06/20/15 with complaints of nausea and  vomiting and found to be in DKA. CT head, chest, abdomen and pelvis done day prior showed constipation but nothing acute. Admitted to stepdown for further management. DKA resolved. Transitioned to Levemir and SSI. Transferred to floor. Frequent episodes of hypoglycemia/hyperglycemia-brittle DM. 7 hospital admissions since July 2016 and most of them related to her poor glycemic control.Dr Waymon Amato Discussed with her primary endocrinologist who made recommendations.   DKA in type I DM, not at goal/hypoglycemia/Brittle DM - Hemoglobin A1c 05/20/15 is 8.2. - Admitted to stepdown unit and treated per DKA protocol with aggressive IV fluids, insulin drip, frequent BMP monitoring. - After DKA resolved, transitioned to Levemir at pta doses and SSI. - This might have been precipitated by acute viral GE +/- medication noncompliance. - Hypoglycemic CBG at 39 mg per DL on 1/61 AM. Concern for brittle diabetes. Reduced Levemir from 6 units twice a day to 6 units at bedtime. Continue NovoLog SSI. - Patient has had 7 hospital admissions since July 2016. Several of them were due to poorly controlled DM and DKA. - Despite reducing Levemir, again had hypoglycemic episode this morning with CBG of 27. - DR Hongalgi Discussed at length with patient's PCP/Primary endocrinologist Dr. [name] who also managed her diabetes prior to this admission at the SNF. 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 This is also complicated by patient's inconsistent and poor oral intake. He recommended changing Levemir to Lantus at same dose for better 24-hour coverage and change NovoLog SSI to modified lower dose - Patient's regimen discharge include Lantus 6 units at bedtime, insulin sliding scale, modified 1-5 units. -  poor by mouth intake, make it  harder to control her CBGs, as hypoglycemia is unpredictable giving her unreliable oral intake.  Dementia with behavioral disturbance - On  1/14, agitated, hitting out at staff and trying to get out of bed. - Secondary to hospitalization and metabolic derangement complicating underlying possible advanced dementia. - Agitation has resolved.   Nausea and vomiting/possible acute viral GE - resolved. Resumed diet after DKA resolved. per RN, not eating much.  - As per her endocrinologist, she does have some degree of gastroparesis.  Dehydration - resolved.   Hypokalemia/hypomagnesemia - Repleted  Acute kidney injury - Resolved.  Anemia - Stable.  Essential hypertension - Continue amlodipine & prazosin and monitor. better controlled.   Anxiety and depression - Continue Abilify, Zoloft and Ativan at prior home doses.  HLD - Statins.  Constipation -  Started bowel regimen.   Lung lesion found on CT of the neck (left  lower lung) - This was during previous hospitalization. patient had repeat CT chest with contrast which confirmed left lower lobe opacity , cost with radiology they recommend  Follow-up CT in 3 months as outpatient.  Adult failure to thrive/severe protein calorie malnutrition - Secondary to brittle diabetes, recurrent hospitalizations complicating underlying dementia.? Consider palliative care consultation. - patient remains with poor oral intake , started on ensure and live by mouth 3 times a day , on ensure enlive TID     Discharge Condition: stable   Follow UP  Follow-up Information    Schedule an appointment as soon as possible for a visit with [name], MD.   Specialty:  Endocrinology   Why:  Post hospital stay follow-up   Contact information:   [address] [place] [address] [phone]         Discharge Instructions  and  Discharge Medications     Discharge Instructions    Discharge instructions    Complete by:  As directed   Follow with Primary MD [name], MD in 7 days   Get CBC, CMP,  checked  by Primary MD next visit.    Activity: As  tolerated with Full fall precautions use walker/cane & assistance as needed   Disposition SNF   Diet: Carbohydrate modified, with feeding assistance and aspiration precautions.  For Heart failure patients - Check your Weight same time everyday, if you gain over 2 pounds, or you develop in leg swelling, experience more shortness of breath or chest pain, call your Primary MD immediately. Follow Cardiac Low Salt Diet and 1.5 lit/day fluid restriction.   On your next visit with your primary care physician please Get Medicines reviewed and adjusted.   Please request your Prim.MD to go over all Hospital Tests and Procedure/Radiological results at the follow up, please get all Hospital records sent to your Prim MD by signing hospital release before you go home.   If you experience worsening of your admission symptoms, develop shortness of breath, life threatening emergency, suicidal or homicidal thoughts you must seek medical attention immediately by calling 911 or calling your MD immediately  if symptoms less severe.  You Must read complete instructions/literature along with all the possible adverse reactions/side effects for all the Medicines you take and that have been prescribed to you. Take any new Medicines after you have completely understood and accpet all the possible adverse reactions/side effects.   Do not drive, operating heavy machinery, perform activities at heights, swimming or participation in water activities or provide baby sitting services if your were admitted for syncope or siezures until you have seen by Primary MD or a Neurologist and advised to do so again.  Do not drive when taking Pain medications.    Do not take more than prescribed Pain, Sleep and Anxiety Medications  Special Instructions: If you have smoked or chewed Tobacco  in the last 2 yrs please stop smoking, stop any regular Alcohol  and or any Recreational drug use.  Wear Seat belts while  driving.   Please note  You were cared for by a hospitalist during your hospital stay. If you have any questions about your discharge medications or the care you received while you were in the hospital after you are discharged, you can call the unit and asked to speak with the hospitalist on call if the hospitalist that took care of you is not available.  Once you are discharged, your primary care physician will handle any further medical issues. Please note that NO REFILLS for any discharge medications will be authorized once you are discharged, as it is imperative that you return to your primary care physician (or establish a relationship with a primary care physician if you do not have one) for your aftercare needs so that they can reassess your need for medications and monitor your lab values.     Increase activity slowly    Complete by:  As directed             Medication List    STOP taking these medications        insulin detemir 100 UNIT/ML injection  Commonly known as:  LEVEMIR      TAKE these medications        acetaminophen 325 MG tablet  Commonly known as:  TYLENOL  Take 2 tablets (650 mg total) by mouth every 6 (six) hours as needed for mild pain (or Fever >/= 101).     amLODipine 10 MG tablet  Commonly known as:  NORVASC  Take 1 tablet (10 mg total) by mouth daily.     ARIPiprazole 5 MG tablet  Commonly known as:  ABILIFY  Take 1 tablet (5 mg total) by mouth every morning.     aspirin EC 81 MG tablet  Take 1 tablet (81 mg total) by mouth daily.     bisacodyl 10 MG suppository  Commonly known as:  DULCOLAX  Place 1 suppository (10 mg total) rectally daily as needed for moderate constipation.     donepezil 10 MG tablet  Commonly known as:  ARICEPT  Take 10 mg by mouth at bedtime.     feeding supplement (ENSURE ENLIVE) Liqd  Take 237 mLs by mouth 3 (three) times daily between meals.     ferrous sulfate 325 (65 FE) MG tablet  Take 325 mg by mouth 2 (two)  times daily.     insulin aspart 100 UNIT/ML injection  Commonly known as:  novoLOG  Inject 0-5 Units into the skin 3 (three) times daily with meals. CBG 70-150:  0 units CBG 151-200: 1 units CBG 201-250: 2 Units CBG 251-300: 3 units CBG 301-350: 4 units CBG 351-400: 5 units CBG > 400, please Call MD     insulin glargine 100 UNIT/ML injection  Commonly known as:  LANTUS  Inject 0.06 mLs (6 Units total) into the skin at bedtime.     LAMICTAL 100 MG tablet  Generic drug:  lamoTRIgine  Take 50 mg by mouth at bedtime.     LORazepam 0.5 MG tablet  Commonly known as:  ATIVAN  Take 1 tablet (0.5 mg total) by mouth at bedtime. Anxiety     multivitamin with minerals Tabs tablet  Take 1 tablet by mouth daily.     OVER THE COUNTER MEDICATION  Take 1 capsule by mouth daily. Decubivite     pantoprazole 40 MG tablet  Commonly known as:  PROTONIX  Take 1 tablet (40 mg total) by mouth 2 (two) times daily.     polyethylene glycol packet  Commonly known as:  MIRALAX / GLYCOLAX  Take 17 g by mouth daily as needed for mild constipation.     prazosin 1 MG capsule  Commonly known as:  MINIPRESS  Take 1 mg by mouth at bedtime.     rosuvastatin 5 MG tablet  Commonly known as:  CRESTOR  Take 5 mg by mouth daily.  sennosides-docusate sodium 8.6-50 MG tablet  Commonly known as:  SENOKOT-S  Take 2 tablets by mouth 2 (two) times daily.     sertraline 100 MG tablet  Commonly known as:  ZOLOFT  Take 150 mg by mouth daily.     traMADol 50 MG tablet  Commonly known as:  ULTRAM  Take 1 tablet (50 mg total) by mouth every 6 (six) hours as needed for moderate pain.     Vitamin D3 2000 units Tabs  Take 2,000 Units by mouth daily. For supplement          Diet and Activity recommendation: See Discharge Instructions above   Consults obtained -  - Primary endocrinologist via phone .   Major procedures and Radiology Reports - PLEASE review detailed and final reports for all details, in  brief -      Dg Chest 2 View  06/20/2015  CLINICAL DATA:  Altered mental status with fever and vomiting for 1 day. EXAM: CHEST  2 VIEW COMPARISON:  Radiographs and CT 05/20/2015 FINDINGS: Lung volumes are low. Unchanged cardiomediastinal contours with mild cardiomegaly. Coronary stent in place. Linear atelectasis or scarring in the lingula. No consolidation to suggest pneumonia. No pleural effusion or pneumothorax. Left rib fractures on prior exam are partially seen. Kyphoplasty in the thoracolumbar spine. IMPRESSION: Linear atelectasis or scarring in the lingula. No acute process or change from prior. Electronically Signed   By: Rubye Oaks M.D.   On: 06/20/2015 00:33   Ct Head Wo Contrast  06/19/2015  CLINICAL DATA:  Fever today. Vomiting beginning last night. Altered mental status. EXAM: CT HEAD WITHOUT CONTRAST TECHNIQUE: Contiguous axial images were obtained from the base of the skull through the vertex without intravenous contrast. COMPARISON:  Brain MRI 05/20/2015. FINDINGS: There is cortical atrophy and chronic microvascular ischemic change. No evidence of acute abnormality including infarct, hemorrhage, mass lesion, mass effect, midline shift or abnormal extra-axial fluid collections identified. No hydrocephalus or pneumocephalus. The calvarium is intact. IMPRESSION: No acute abnormality. Atrophy and chronic microvascular ischemic change. Electronically Signed   By: Drusilla Kanner M.D.   On: 06/19/2015 20:59   Ct Chest W Contrast  06/20/2015  CLINICAL DATA:  Acute onset of lethargy. Vomiting. Hyperglycemia. Initial encounter. EXAM: CT CHEST, ABDOMEN, AND PELVIS WITH CONTRAST TECHNIQUE: Multidetector CT imaging of the chest, abdomen and pelvis was performed following the standard protocol during bolus administration of intravenous contrast. CONTRAST:  OMNIPAQUE IOHEXOL 300 MG/ML  SOLN COMPARISON:  CTA of the chest performed 05/20/2015, and MRI of the thoracic and lumbar spine  performed 02/10/2011 FINDINGS: CT CHEST Bilateral atelectasis or scarring is noted. Minimal nodular opacity is noted at the superior aspect of the left lower lobe. This could reflect a mild infectious process. The lungs are otherwise clear. No pleural effusion or pneumothorax is seen. No suspicious masses are identified. Diffuse coronary artery calcifications are seen. Scattered calcification is noted along the thoracic aorta. No mediastinal lymphadenopathy is seen. No pericardial effusion is identified. The great vessels are grossly unremarkable in appearance. The thyroid gland is unremarkable in appearance. No axillary lymphadenopathy is seen. No acute osseous abnormalities are identified. Cervical spinal fusion hardware is noted. CT ABDOMEN AND PELVIS The liver and spleen are unremarkable in appearance. The gallbladder is within normal limits. The pancreas and adrenal glands are unremarkable. The kidneys are unremarkable in appearance. There is no evidence of hydronephrosis. No renal or ureteral stones are seen. No perinephric stranding is appreciated. No free fluid is identified. The  small bowel is unremarkable in appearance. The stomach is within normal limits. No acute vascular abnormalities are seen. Diffuse calcification is noted along the abdominal aorta and its branches. The patient is status post appendectomy. The colon is largely decompressed, aside from a relatively large amount of stool in the rectum. The bladder is mildly distended and grossly unremarkable. The patient is status post hysterectomy. No suspicious adnexal masses are seen. No inguinal lymphadenopathy is seen. No acute osseous abnormalities are identified. The patient is status post vertebroplasty at L1. IMPRESSION: 1. No acute abnormality seen to explain the patient's symptoms. 2. Minimal nodular opacity at the superior aspect of the left lower lung lobe. This could reflect a mild infectious process. Bilateral atelectasis or scarring  noted. 3. Diffuse coronary artery calcifications seen. 4. Diffuse calcification along the abdominal aorta and its branches. 5. Relatively large amount of stool in the rectum. Colon otherwise largely decompressed. Electronically Signed   By: Roanna Raider M.D.   On: 06/20/2015 23:45   Ct Abdomen Pelvis W Contrast  06/20/2015  CLINICAL DATA:  Acute onset of lethargy. Vomiting. Hyperglycemia. Initial encounter. EXAM: CT CHEST, ABDOMEN, AND PELVIS WITH CONTRAST TECHNIQUE: Multidetector CT imaging of the chest, abdomen and pelvis was performed following the standard protocol during bolus administration of intravenous contrast. CONTRAST:  OMNIPAQUE IOHEXOL 300 MG/ML  SOLN COMPARISON:  CTA of the chest performed 05/20/2015, and MRI of the thoracic and lumbar spine performed 02/10/2011 FINDINGS: CT CHEST Bilateral atelectasis or scarring is noted. Minimal nodular opacity is noted at the superior aspect of the left lower lobe. This could reflect a mild infectious process. The lungs are otherwise clear. No pleural effusion or pneumothorax is seen. No suspicious masses are identified. Diffuse coronary artery calcifications are seen. Scattered calcification is noted along the thoracic aorta. No mediastinal lymphadenopathy is seen. No pericardial effusion is identified. The great vessels are grossly unremarkable in appearance. The thyroid gland is unremarkable in appearance. No axillary lymphadenopathy is seen. No acute osseous abnormalities are identified. Cervical spinal fusion hardware is noted. CT ABDOMEN AND PELVIS The liver and spleen are unremarkable in appearance. The gallbladder is within normal limits. The pancreas and adrenal glands are unremarkable. The kidneys are unremarkable in appearance. There is no evidence of hydronephrosis. No renal or ureteral stones are seen. No perinephric stranding is appreciated. No free fluid is identified. The small bowel is unremarkable in appearance. The stomach is within  normal limits. No acute vascular abnormalities are seen. Diffuse calcification is noted along the abdominal aorta and its branches. The patient is status post appendectomy. The colon is largely decompressed, aside from a relatively large amount of stool in the rectum. The bladder is mildly distended and grossly unremarkable. The patient is status post hysterectomy. No suspicious adnexal masses are seen. No inguinal lymphadenopathy is seen. No acute osseous abnormalities are identified. The patient is status post vertebroplasty at L1. IMPRESSION: 1. No acute abnormality seen to explain the patient's symptoms. 2. Minimal nodular opacity at the superior aspect of the left lower lung lobe. This could reflect a mild infectious process. Bilateral atelectasis or scarring noted. 3. Diffuse coronary artery calcifications seen. 4. Diffuse calcification along the abdominal aorta and its branches. 5. Relatively large amount of stool in the rectum. Colon otherwise largely decompressed. Electronically Signed   By: Roanna Raider M.D.   On: 06/20/2015 23:45    Micro Results    Recent Results (from the past 240 hour(s))  Urine culture  Status: None   Collection Time: 06/19/15  8:51 PM  Result Value Ref Range Status   Specimen Description URINE, CATHETERIZED  Final   Special Requests NONE  Final   Culture   Final    NO GROWTH 1 DAY Performed at [facility]    Report Status 06/21/2015 FINAL  Final  MRSA PCR Screening     Status: None   Collection Time: 06/21/15  6:15 AM  Result Value Ref Range Status   MRSA by PCR NEGATIVE NEGATIVE Final    Comment:        The GeneXpert MRSA Assay (FDA approved for NASAL specimens only), is one component of a comprehensive MRSA colonization surveillance program. It is not intended to diagnose MRSA infection nor to guide or monitor treatment for MRSA infections.        Today   Subjective:   Pasty Manninen today has no headache,no chest or abdominal  pain, patient reports no appetite.  Blood pressure 117/51, pulse 70, temperature 98.3 F (36.8 C), temperature source Oral, resp. rate 19, height  (1.575 m), weight 40.9 kg (90 lb 2.7 oz), SpO2 98 %.   Intake/Output Summary (Last 24 hours) at 06/27/15 1333 Last data filed at 06/27/15 1044  Gross per 24 hour  Intake    560 ml  Output      0 ml  Net    560 ml    Exam General exam: Small built and frail pleasant elderly female sitting up comfortably in chair this morning. Respiratory system: Clear. No increased work of breathing. Cardiovascular system: S1 & S2 heard, RRR. No JVD, murmurs, gallops, clicks or pedal edema.  Gastrointestinal system: Abdomen is nondistended, soft and nontender. Normal bowel sounds heard. Central nervous system: Alert and oriented only to self. No focal neurological deficits. Extremities: Symmetric 5 x 5 power.  Data Review   CBC w Diff: Lab Results  Component Value Date   WBC 13.5* 06/20/2015   WBC 5.1 02/28/2015   HGB 10.8* 06/20/2015   HCT 32.4* 06/20/2015   PLT 309 06/20/2015   LYMPHOPCT 8 06/20/2015   MONOPCT 6 06/20/2015   EOSPCT 0 06/20/2015   BASOPCT 0 06/20/2015    CMP: Lab Results  Component Value Date   NA 136 06/26/2015   NA 140 02/28/2015   K 3.9 06/26/2015   CL 97* 06/26/2015   CO2 31 06/26/2015   BUN 11 06/26/2015   BUN 12 02/28/2015   CREATININE 0.75 06/26/2015   CREATININE 0.7 02/28/2015   GLU 85 02/28/2015   PROT 5.1* 06/22/2015   ALBUMIN 2.8* 06/22/2015   BILITOT 0.9 06/22/2015   ALKPHOS 73 06/22/2015   AST 56* 06/22/2015   ALT 41 06/22/2015  .   Total Time in preparing paper work, data evaluation and todays exam - 35 minutes  Joncarlo Friberg M.D on 06/27/2015 at 1:33 PM  [facility]   Office  [phone]

## 2015-06-27 NOTE — Progress Notes (Signed)
Physical Therapy Treatment Patient Details Name: Beth Payne MRN: [id number] DOB: 1940/09/26 Today's Date: 06/27/2015    History of Present Illness Pt admitted from [facility] with DKA. PMHx: dementia, DM, CAD, HTN    PT Comments    Patient required mod/max A for ambulation and mod A for transfers with pt disoriented to situation. Continue to progress as tolerated with anticipated d/c to SNF for further skilled PT services.    Follow Up Recommendations  SNF;Supervision for mobility/OOB     Equipment Recommendations  None recommended by PT    Recommendations for Other Services       Precautions / Restrictions Precautions Precautions: Fall Precaution Comments: incontinent Restrictions Weight Bearing Restrictions: No    Mobility  Bed Mobility Overal bed mobility: Needs Assistance Bed Mobility: Supine to Sit;Sit to Supine     Supine to sit: Min assist;HOB elevated Sit to supine: Min guard;Min assist   General bed mobility comments: HOB elevated and use of bedrail; min A to elevate trunk into sitting and scoot hips to EOB; min guard for sit to supine with min A required with use of bed pad for positioning; vc for technique  Transfers Overall transfer level: Needs assistance Equipment used: Rolling walker (2 wheeled) Transfers: Sit to/from Stand Sit to Stand: Mod assist         General transfer comment: vc for hand placement with assist for powering up into standing from EOB and for achieving upright posture upon standing  Ambulation/Gait Ambulation/Gait assistance: Mod assist;Max assist Ambulation Distance (Feet): 25 Feet Assistive device: Rolling walker (2 wheeled) Gait Pattern/deviations: Shuffle;Trunk flexed;Narrow base of support   Gait velocity interpretation: Below normal speed for age/gender General Gait Details: max cues for posture, sequencing, and position of RW and mod/max A for maintaining balance; pt with heavy posterior lean    Stairs             Wheelchair Mobility    Modified Rankin (Stroke Patients Only)       Balance Overall balance assessment: Needs assistance Sitting-balance support: Bilateral upper extremity supported;Feet supported Sitting balance-Leahy Scale: Fair     Standing balance support: Bilateral upper extremity supported Standing balance-Leahy Scale: Poor                      Cognition Arousal/Alertness: Awake/alert Behavior During Therapy: Flat affect Overall Cognitive Status: No family/caregiver present to determine baseline cognitive functioning                      Exercises      General Comments        Pertinent Vitals/Pain Pain Assessment: Faces Faces Pain Scale: Hurts a little bit Pain Location: bilat LE (when standing) Pain Descriptors / Indicators: Sore Pain Intervention(s): Limited activity within patient's tolerance;Monitored during session;Repositioned    Home Living                      Prior Function            PT Goals (current goals can now be found in the care plan section) Acute Rehab PT Goals Patient Stated Goal: none stated Progress towards PT goals: Progressing toward goals    Frequency  Min 2X/week    PT Plan Current plan remains appropriate    Co-evaluation             End of Session Equipment Utilized During Treatment: Gait belt Activity Tolerance: Patient tolerated treatment well Patient left:  with call bell/phone within reach;in bed;with bed alarm set     Time: 1143-1206 PT Time Calculation (min) (ACUTE ONLY): 23 min  Charges:  $Gait Training: 8-22 mins $Therapeutic Activity: 8-22 mins                    G Codes:      Derek Mound, PTA Pager: [phone]   06/27/2015, 12:19 PM

## 2015-06-27 NOTE — Progress Notes (Signed)
Patient discharged to [facility], report was given to nurse Tiwan.

## 2015-06-27 NOTE — Progress Notes (Signed)
CSW spoke with the Pt's HCPOA and informed him of the scheduled d/c.   HCPOA is in agreement with the d/c to [facility]. CSW awaiting d/c orders/summary and then will set up transport to facility. CSW also awaiting signature of the DNR.   Leron Croak LCSWA  [facility] [phone]

## 2015-06-28 DIAGNOSIS — E109 Type 1 diabetes mellitus without complications: Secondary | ICD-10-CM | POA: Diagnosis not present

## 2015-06-28 DIAGNOSIS — F0390 Unspecified dementia without behavioral disturbance: Secondary | ICD-10-CM | POA: Diagnosis not present

## 2015-06-28 DIAGNOSIS — I10 Essential (primary) hypertension: Secondary | ICD-10-CM | POA: Diagnosis not present

## 2015-06-28 DIAGNOSIS — J984 Other disorders of lung: Secondary | ICD-10-CM | POA: Diagnosis not present

## 2015-06-30 DIAGNOSIS — E440 Moderate protein-calorie malnutrition: Secondary | ICD-10-CM | POA: Diagnosis not present

## 2015-06-30 DIAGNOSIS — I2510 Atherosclerotic heart disease of native coronary artery without angina pectoris: Secondary | ICD-10-CM | POA: Diagnosis not present

## 2015-06-30 DIAGNOSIS — M6281 Muscle weakness (generalized): Secondary | ICD-10-CM | POA: Diagnosis not present

## 2015-06-30 DIAGNOSIS — J984 Other disorders of lung: Secondary | ICD-10-CM | POA: Diagnosis not present

## 2015-06-30 DIAGNOSIS — D6489 Other specified anemias: Secondary | ICD-10-CM | POA: Diagnosis not present

## 2015-06-30 DIAGNOSIS — E1010 Type 1 diabetes mellitus with ketoacidosis without coma: Secondary | ICD-10-CM | POA: Diagnosis not present

## 2015-06-30 DIAGNOSIS — I10 Essential (primary) hypertension: Secondary | ICD-10-CM | POA: Diagnosis not present

## 2015-06-30 DIAGNOSIS — E103599 Type 1 diabetes mellitus with proliferative diabetic retinopathy without macular edema, unspecified eye: Secondary | ICD-10-CM | POA: Diagnosis not present

## 2015-06-30 DIAGNOSIS — F0390 Unspecified dementia without behavioral disturbance: Secondary | ICD-10-CM | POA: Diagnosis not present

## 2015-06-30 DIAGNOSIS — M818 Other osteoporosis without current pathological fracture: Secondary | ICD-10-CM | POA: Diagnosis not present

## 2015-06-30 DIAGNOSIS — F3340 Major depressive disorder, recurrent, in remission, unspecified: Secondary | ICD-10-CM | POA: Diagnosis not present

## 2015-06-30 DIAGNOSIS — R918 Other nonspecific abnormal finding of lung field: Secondary | ICD-10-CM | POA: Diagnosis not present

## 2015-06-30 DIAGNOSIS — E109 Type 1 diabetes mellitus without complications: Secondary | ICD-10-CM | POA: Diagnosis not present

## 2015-06-30 DIAGNOSIS — K219 Gastro-esophageal reflux disease without esophagitis: Secondary | ICD-10-CM | POA: Diagnosis not present

## 2015-06-30 DIAGNOSIS — M14679 Charcot's joint, unspecified ankle and foot: Secondary | ICD-10-CM | POA: Diagnosis not present

## 2015-07-03 DIAGNOSIS — E109 Type 1 diabetes mellitus without complications: Secondary | ICD-10-CM | POA: Diagnosis not present

## 2015-07-03 DIAGNOSIS — I10 Essential (primary) hypertension: Secondary | ICD-10-CM | POA: Diagnosis not present

## 2015-07-03 DIAGNOSIS — R531 Weakness: Secondary | ICD-10-CM | POA: Diagnosis not present

## 2015-07-03 DIAGNOSIS — F0390 Unspecified dementia without behavioral disturbance: Secondary | ICD-10-CM | POA: Diagnosis not present

## 2015-07-05 DIAGNOSIS — I10 Essential (primary) hypertension: Secondary | ICD-10-CM | POA: Diagnosis not present

## 2015-07-05 DIAGNOSIS — F0390 Unspecified dementia without behavioral disturbance: Secondary | ICD-10-CM | POA: Diagnosis not present

## 2015-07-05 DIAGNOSIS — E109 Type 1 diabetes mellitus without complications: Secondary | ICD-10-CM | POA: Diagnosis not present

## 2015-07-10 DIAGNOSIS — E109 Type 1 diabetes mellitus without complications: Secondary | ICD-10-CM | POA: Diagnosis not present

## 2015-07-10 DIAGNOSIS — F0390 Unspecified dementia without behavioral disturbance: Secondary | ICD-10-CM | POA: Diagnosis not present

## 2015-07-10 DIAGNOSIS — R531 Weakness: Secondary | ICD-10-CM | POA: Diagnosis not present

## 2015-07-10 DIAGNOSIS — I10 Essential (primary) hypertension: Secondary | ICD-10-CM | POA: Diagnosis not present

## 2015-07-16 DIAGNOSIS — E109 Type 1 diabetes mellitus without complications: Secondary | ICD-10-CM | POA: Diagnosis not present

## 2015-07-16 DIAGNOSIS — R531 Weakness: Secondary | ICD-10-CM | POA: Diagnosis not present

## 2015-07-16 DIAGNOSIS — F0390 Unspecified dementia without behavioral disturbance: Secondary | ICD-10-CM | POA: Diagnosis not present

## 2015-07-16 DIAGNOSIS — I10 Essential (primary) hypertension: Secondary | ICD-10-CM | POA: Diagnosis not present

## 2015-07-18 DIAGNOSIS — F0390 Unspecified dementia without behavioral disturbance: Secondary | ICD-10-CM | POA: Diagnosis not present

## 2015-07-18 DIAGNOSIS — S2232XA Fracture of one rib, left side, initial encounter for closed fracture: Secondary | ICD-10-CM | POA: Diagnosis not present

## 2015-07-18 DIAGNOSIS — E109 Type 1 diabetes mellitus without complications: Secondary | ICD-10-CM | POA: Diagnosis not present

## 2015-07-18 DIAGNOSIS — I10 Essential (primary) hypertension: Secondary | ICD-10-CM | POA: Diagnosis not present

## 2015-07-23 DIAGNOSIS — M79632 Pain in left forearm: Secondary | ICD-10-CM | POA: Diagnosis not present

## 2015-07-23 DIAGNOSIS — W19XXXA Unspecified fall, initial encounter: Secondary | ICD-10-CM | POA: Diagnosis not present

## 2015-07-23 DIAGNOSIS — M25512 Pain in left shoulder: Secondary | ICD-10-CM | POA: Diagnosis not present

## 2015-07-23 DIAGNOSIS — M25532 Pain in left wrist: Secondary | ICD-10-CM | POA: Diagnosis not present

## 2015-07-23 DIAGNOSIS — M79622 Pain in left upper arm: Secondary | ICD-10-CM | POA: Diagnosis not present

## 2015-07-24 DIAGNOSIS — R531 Weakness: Secondary | ICD-10-CM | POA: Diagnosis not present

## 2015-07-24 DIAGNOSIS — F0390 Unspecified dementia without behavioral disturbance: Secondary | ICD-10-CM | POA: Diagnosis not present

## 2015-07-24 DIAGNOSIS — E109 Type 1 diabetes mellitus without complications: Secondary | ICD-10-CM | POA: Diagnosis not present

## 2015-07-24 DIAGNOSIS — I10 Essential (primary) hypertension: Secondary | ICD-10-CM | POA: Diagnosis not present

## 2015-07-29 DIAGNOSIS — K219 Gastro-esophageal reflux disease without esophagitis: Secondary | ICD-10-CM | POA: Diagnosis not present

## 2015-07-29 DIAGNOSIS — I2510 Atherosclerotic heart disease of native coronary artery without angina pectoris: Secondary | ICD-10-CM | POA: Diagnosis not present

## 2015-07-29 DIAGNOSIS — D638 Anemia in other chronic diseases classified elsewhere: Secondary | ICD-10-CM | POA: Diagnosis not present

## 2015-07-29 DIAGNOSIS — I10 Essential (primary) hypertension: Secondary | ICD-10-CM | POA: Diagnosis not present

## 2015-07-29 DIAGNOSIS — E1165 Type 2 diabetes mellitus with hyperglycemia: Secondary | ICD-10-CM | POA: Diagnosis not present

## 2015-07-29 DIAGNOSIS — F339 Major depressive disorder, recurrent, unspecified: Secondary | ICD-10-CM | POA: Diagnosis not present

## 2015-07-29 DIAGNOSIS — E784 Other hyperlipidemia: Secondary | ICD-10-CM | POA: Diagnosis not present

## 2015-07-29 DIAGNOSIS — M6281 Muscle weakness (generalized): Secondary | ICD-10-CM | POA: Diagnosis not present

## 2015-07-29 DIAGNOSIS — R918 Other nonspecific abnormal finding of lung field: Secondary | ICD-10-CM | POA: Diagnosis not present

## 2015-07-29 DIAGNOSIS — R627 Adult failure to thrive: Secondary | ICD-10-CM | POA: Diagnosis not present

## 2015-07-29 DIAGNOSIS — E1010 Type 1 diabetes mellitus with ketoacidosis without coma: Secondary | ICD-10-CM | POA: Diagnosis not present

## 2015-07-29 DIAGNOSIS — E440 Moderate protein-calorie malnutrition: Secondary | ICD-10-CM | POA: Diagnosis not present

## 2015-07-30 DIAGNOSIS — F0390 Unspecified dementia without behavioral disturbance: Secondary | ICD-10-CM | POA: Diagnosis not present

## 2015-07-30 DIAGNOSIS — I10 Essential (primary) hypertension: Secondary | ICD-10-CM | POA: Diagnosis not present

## 2015-07-30 DIAGNOSIS — E162 Hypoglycemia, unspecified: Secondary | ICD-10-CM | POA: Diagnosis not present

## 2015-07-30 DIAGNOSIS — E109 Type 1 diabetes mellitus without complications: Secondary | ICD-10-CM | POA: Diagnosis not present

## 2015-08-06 DIAGNOSIS — F0390 Unspecified dementia without behavioral disturbance: Secondary | ICD-10-CM | POA: Diagnosis not present

## 2015-08-06 DIAGNOSIS — E109 Type 1 diabetes mellitus without complications: Secondary | ICD-10-CM | POA: Diagnosis not present

## 2015-08-06 DIAGNOSIS — I10 Essential (primary) hypertension: Secondary | ICD-10-CM | POA: Diagnosis not present

## 2015-08-10 ENCOUNTER — Encounter (HOSPITAL_COMMUNITY): Payer: Self-pay | Admitting: Emergency Medicine

## 2015-08-10 ENCOUNTER — Emergency Department (HOSPITAL_COMMUNITY)
Admission: EM | Admit: 2015-08-10 | Discharge: 2015-08-10 | Disposition: A | Discharge status: Home / Self Care | Payer: Medicare Other | Attending: Emergency Medicine | Admitting: Emergency Medicine

## 2015-08-10 ENCOUNTER — Emergency Department (HOSPITAL_COMMUNITY): Payer: Medicare Other

## 2015-08-10 DIAGNOSIS — Z8673 Personal history of transient ischemic attack (TIA), and cerebral infarction without residual deficits: Secondary | ICD-10-CM | POA: Insufficient documentation

## 2015-08-10 DIAGNOSIS — Y998 Other external cause status: Secondary | ICD-10-CM | POA: Diagnosis not present

## 2015-08-10 DIAGNOSIS — Z87891 Personal history of nicotine dependence: Secondary | ICD-10-CM | POA: Insufficient documentation

## 2015-08-10 DIAGNOSIS — W01198A Fall on same level from slipping, tripping and stumbling with subsequent striking against other object, initial encounter: Secondary | ICD-10-CM | POA: Insufficient documentation

## 2015-08-10 DIAGNOSIS — Y9389 Activity, other specified: Secondary | ICD-10-CM | POA: Insufficient documentation

## 2015-08-10 DIAGNOSIS — Z794 Long term (current) use of insulin: Secondary | ICD-10-CM | POA: Diagnosis not present

## 2015-08-10 DIAGNOSIS — Z79899 Other long term (current) drug therapy: Secondary | ICD-10-CM | POA: Insufficient documentation

## 2015-08-10 DIAGNOSIS — M1990 Unspecified osteoarthritis, unspecified site: Secondary | ICD-10-CM | POA: Diagnosis not present

## 2015-08-10 DIAGNOSIS — R739 Hyperglycemia, unspecified: Secondary | ICD-10-CM

## 2015-08-10 DIAGNOSIS — M797 Fibromyalgia: Secondary | ICD-10-CM | POA: Insufficient documentation

## 2015-08-10 DIAGNOSIS — S0001XA Abrasion of scalp, initial encounter: Secondary | ICD-10-CM

## 2015-08-10 DIAGNOSIS — F0390 Unspecified dementia without behavioral disturbance: Secondary | ICD-10-CM | POA: Diagnosis not present

## 2015-08-10 DIAGNOSIS — S0003XA Contusion of scalp, initial encounter: Secondary | ICD-10-CM

## 2015-08-10 DIAGNOSIS — R011 Cardiac murmur, unspecified: Secondary | ICD-10-CM | POA: Diagnosis not present

## 2015-08-10 DIAGNOSIS — F419 Anxiety disorder, unspecified: Secondary | ICD-10-CM | POA: Diagnosis not present

## 2015-08-10 DIAGNOSIS — S0180XA Unspecified open wound of other part of head, initial encounter: Secondary | ICD-10-CM | POA: Diagnosis not present

## 2015-08-10 DIAGNOSIS — Z7982 Long term (current) use of aspirin: Secondary | ICD-10-CM | POA: Diagnosis not present

## 2015-08-10 DIAGNOSIS — L89310 Pressure ulcer of right buttock, unstageable: Secondary | ICD-10-CM | POA: Diagnosis not present

## 2015-08-10 DIAGNOSIS — L89320 Pressure ulcer of left buttock, unstageable: Secondary | ICD-10-CM | POA: Diagnosis not present

## 2015-08-10 DIAGNOSIS — E1065 Type 1 diabetes mellitus with hyperglycemia: Secondary | ICD-10-CM | POA: Insufficient documentation

## 2015-08-10 DIAGNOSIS — Y92002 Bathroom of unspecified non-institutional (private) residence single-family (private) house as the place of occurrence of the external cause: Secondary | ICD-10-CM | POA: Diagnosis not present

## 2015-08-10 DIAGNOSIS — F329 Major depressive disorder, single episode, unspecified: Secondary | ICD-10-CM | POA: Insufficient documentation

## 2015-08-10 DIAGNOSIS — E162 Hypoglycemia, unspecified: Secondary | ICD-10-CM | POA: Diagnosis not present

## 2015-08-10 DIAGNOSIS — L8990 Pressure ulcer of unspecified site, unspecified stage: Secondary | ICD-10-CM

## 2015-08-10 DIAGNOSIS — I2510 Atherosclerotic heart disease of native coronary artery without angina pectoris: Secondary | ICD-10-CM | POA: Diagnosis not present

## 2015-08-10 DIAGNOSIS — S0990XA Unspecified injury of head, initial encounter: Secondary | ICD-10-CM | POA: Diagnosis present

## 2015-08-10 DIAGNOSIS — S199XXA Unspecified injury of neck, initial encounter: Secondary | ICD-10-CM | POA: Diagnosis not present

## 2015-08-10 DIAGNOSIS — G8929 Other chronic pain: Secondary | ICD-10-CM | POA: Insufficient documentation

## 2015-08-10 DIAGNOSIS — T148 Other injury of unspecified body region: Secondary | ICD-10-CM | POA: Diagnosis not present

## 2015-08-10 DIAGNOSIS — K219 Gastro-esophageal reflux disease without esophagitis: Secondary | ICD-10-CM | POA: Insufficient documentation

## 2015-08-10 DIAGNOSIS — R259 Unspecified abnormal involuntary movements: Secondary | ICD-10-CM | POA: Diagnosis not present

## 2015-08-10 DIAGNOSIS — W010XXA Fall on same level from slipping, tripping and stumbling without subsequent striking against object, initial encounter: Secondary | ICD-10-CM

## 2015-08-10 LAB — CBC
HEMATOCRIT: 34.4 % — AB (ref 36.0–46.0)
HEMOGLOBIN: 11.2 g/dL — AB (ref 12.0–15.0)
MCH: 27.9 pg (ref 26.0–34.0)
MCHC: 32.6 g/dL (ref 30.0–36.0)
MCV: 85.6 fL (ref 78.0–100.0)
Platelets: 242 10*3/uL (ref 150–400)
RBC: 4.02 MIL/uL (ref 3.87–5.11)
RDW: 15.5 % (ref 11.5–15.5)
WBC: 10.7 10*3/uL — ABNORMAL HIGH (ref 4.0–10.5)

## 2015-08-10 LAB — URINALYSIS, ROUTINE W REFLEX MICROSCOPIC
Bilirubin Urine: NEGATIVE
HGB URINE DIPSTICK: NEGATIVE
Ketones, ur: 40 mg/dL — AB
Leukocytes, UA: NEGATIVE
Nitrite: NEGATIVE
Protein, ur: NEGATIVE mg/dL
SPECIFIC GRAVITY, URINE: 1.026 (ref 1.005–1.030)
pH: 6.5 (ref 5.0–8.0)

## 2015-08-10 LAB — BASIC METABOLIC PANEL
Anion gap: 14 (ref 5–15)
BUN: 17 mg/dL (ref 6–20)
CHLORIDE: 98 mmol/L — AB (ref 101–111)
CO2: 24 mmol/L (ref 22–32)
CREATININE: 0.73 mg/dL (ref 0.44–1.00)
Calcium: 9.8 mg/dL (ref 8.9–10.3)
GFR calc Af Amer: >60 mL/min (ref >60)
GFR calc non Af Amer: >60 mL/min (ref >60)
Glucose, Bld: 406 mg/dL — ABNORMAL HIGH (ref 65–99)
Potassium: 3.9 mmol/L (ref 3.5–5.1)
Sodium: 136 mmol/L (ref 135–145)

## 2015-08-10 LAB — URINE MICROSCOPIC-ADD ON
BACTERIA UA: NONE SEEN
RBC / HPF: NONE SEEN RBC/hpf (ref 0–5)
WBC, UA: NONE SEEN WBC/hpf (ref 0–5)

## 2015-08-10 LAB — CBG MONITORING, ED
GLUCOSE-CAPILLARY: 259 mg/dL — AB (ref 65–99)
Glucose-Capillary: 404 mg/dL — ABNORMAL HIGH (ref 65–99)

## 2015-08-10 MED ORDER — SODIUM CHLORIDE 0.9 % IV BOLUS (SEPSIS)
1000.0000 mL | Freq: Once | INTRAVENOUS | Status: AC
  Administered 2015-08-10: 1000 mL via INTRAVENOUS

## 2015-08-10 MED ORDER — INSULIN ASPART 100 UNIT/ML ~~LOC~~ SOLN
5.0000 [IU] | Freq: Once | SUBCUTANEOUS | Status: AC
  Administered 2015-08-10: 5 [IU] via SUBCUTANEOUS
  Filled 2015-08-10: qty 1

## 2015-08-10 NOTE — ED Notes (Addendum)
Per EMS- pt reports she was using the bathroom and leaned over to grab the trash can and lost her balance and fell. Denies LOC or dizziness. Pt has laceration/skin tear to right forehead and abrasion to the chin. Pt here from [facility].

## 2015-08-10 NOTE — Discharge Instructions (Signed)
1. Medications: usual home medications 2. Treatment: rest, drink plenty of fluids, keep wounds clean with warm soap and water 3. Follow Up: Please followup with your primary doctor in 2-3 days for discussion of your diagnoses and further evaluation after today's visit; if you do not have a primary care doctor use the resource guide provided to find one; Please return to the ER for worsening symptoms, signs of delayed head bleed including lethargy, vomiting or confusion.

## 2015-08-10 NOTE — ED Notes (Signed)
Patient transported to CT 

## 2015-08-10 NOTE — ED Notes (Signed)
Pt discharged to [facility]- Report called to facility and transport notified. Pt stable.

## 2015-08-10 NOTE — ED Provider Notes (Signed)
  Face-to-face evaluation   History: She injured her head today when she was sitting on a trash can, leaning forward, and lost her balance. Only injury is to the head. She remembers the incident. She required help to stand afterwards. There was no loss of consciousness.  Physical exam: Alert, calm, cooperative, elderly female. 2. Contusions. Mid frontal with small abrasion. She moves all extremities equally. There is no dysarthria or aphasia.  Medical screening examination/treatment/procedure(s) were conducted as a shared visit with non-physician practitioner(s) and myself.  I personally evaluated the patient during the encounter  Mancel BaleElliott Bailley [place], MD 08/10/15 1616

## 2015-08-10 NOTE — ED Provider Notes (Signed)
CSN: [id number]     Arrival date & time 08/10/15  1037 History   First MD Initiated Contact with Patient 08/10/15 1042     Chief Complaint  Patient presents with  . Fall     (Consider location/radiation/quality/duration/timing/severity/associated sxs/prior Treatment) The history is provided by the patient and medical records. No language interpreter was used.     Beth Payne is a 75 y.o. female  with a hx of CAD, depression, TIA, arthritis, IDDM, dementia presents to the Emergency Department with hematoma and laceration to the forehead and scalp after falling this morning.  Pt reports she needed to have an "emergency bowel movement" and was in a hurry to get to the bathroom.  She reports she was sitting on the toilet, leaned over to reach the trash can and fell.  She cannot remember if she fell off the toilet or what the circumstances surrounding the fall were.  Pt reports her Tdap is UTD.  LEVEL 5 CAVEAT FOR DEMENTIA  Discussed with Carina, RN who reports the patient was hurrying to the toilet and did not wait for help.  Pt fell in the bathroom, striking her head on the floor and on a trash can in the bathroom.  Rn states pt was previously on lovenox, but does not think she is still taking it. She does take a daily ASA. Also, RN reports that pt's sugar was very high this morning but she did not receive any insulin as it was not time for a dose.    Past Medical History  Diagnosis Date  . Coronary artery disease   . Depression   . Retinopathy   . Anxiety   . Heart murmur   . TIA (transient ischemic attack)   . Arthritis     "fingers" (12/23/2014)  . Fibromyalgia   . Type I diabetes mellitus (HCC) dx'd 1953    "I'm on a pup" (12/23/2014)  . Chronic lower back pain   . GERD (gastroesophageal reflux disease) dx'd 1995  . Osteopenia dx'd 2007  . Dementia    Past Surgical History  Procedure Laterality Date  . Cataract extraction w/ intraocular lens  implant, bilateral Bilateral  1998  . Tonsillectomy    . Appendectomy    . Bilateral carpal tunnel release Bilateral 2003-2004  . Back surgery    . Anterior cervical decomp/discectomy fusion  05/2003    "C5-6"  . Abdominal hysterectomy  1972  . Dilation and curettage of uterus    . Cesarean section    . Eye surgery Bilateral since 1977    "numerous slaser surgeries for retinopathy"  . Trigger finger release Bilateral 2003  . Cervical disc surgery  2004    "collapsed C-5"  . Fracture surgery    . Patella fracture surgery Left 04/2007  . Vertebroplasty  01/2008    "compressed lumbar fracture"  . Laparoscopic lysis of adhesions  1975-1987 X 5  . Exploratory laparotomy  4098-1191 X 1    "adhesions"  . Oophorectomy Bilateral 1980's  . Vitrectomy Right 08/1997  . Bunionectomy with hammertoe reconstruction Right 02/1999  . Coronary angioplasty with stent placement  07/1999    "?1"  . Lumbar microdiscectomy  03/2005  . Orif ankle fracture Left 12/25/2014    Procedure: OPEN REDUCTION INTERNAL FIXATION (ORIF) LEFT TRIMALLEOLAR ANKLE FRACTURE;  Surgeon: Tarry Kos, MD;  Location: [facility] OR;  Service: Orthopedics;  Laterality: Left;   Family History  Problem Relation Age of Onset  . Liver disease Sister   .  Colon cancer Brother    Social History  Substance Use Topics  . Smoking status: Former Smoker -- 1.00 packs/day for 27 years    Types: Cigarettes  . Smokeless tobacco: Never Used     Comment: "quit smoking cigarettes in 1985  . Alcohol Use: No   OB History    No data available     Review of Systems  Unable to perform ROS: Dementia      Allergies  Diclofenac sodium; Doxycycline calcium; Codeine; Zocor; and Calcium-containing compounds  Home Medications   Prior to Admission medications   Medication Sig Start Date End Date Taking? Authorizing Provider  acetaminophen (TYLENOL) 325 MG tablet Take 2 tablets (650 mg total) by mouth every 6 (six) hours as needed for mild pain (or Fever >/= 101). 12/27/14  Yes  Christiane Ha, MD  amLODipine (NORVASC) 10 MG tablet Take 1 tablet (10 mg total) by mouth daily. 05/22/15  Yes Richarda Overlie, MD  ARIPiprazole (ABILIFY) 5 MG tablet Take 1 tablet (5 mg total) by mouth every morning. 04/28/15  Yes Shanker Levora Dredge, MD  aspirin EC 81 MG tablet Take 1 tablet (81 mg total) by mouth daily. 04/28/15  Yes Shanker Levora Dredge, MD  bisacodyl (DULCOLAX) 10 MG suppository Place 1 suppository (10 mg total) rectally daily as needed for moderate constipation. 06/27/15  Yes Starleen Arms, MD  cholecalciferol (VITAMIN D) 1000 units tablet Take 2,000 Units by mouth daily.   Yes Historical Provider, MD  donepezil (ARICEPT) 10 MG tablet Take 10 mg by mouth at bedtime.   Yes Historical Provider, MD  ferrous sulfate 325 (65 FE) MG tablet Take 325 mg by mouth 2 (two) times daily. 8am, 5pm   Yes Historical Provider, MD  insulin aspart (NOVOLOG) 100 UNIT/ML injection Inject 0-5 Units into the skin 3 (three) times daily with meals. CBG 70-150:  0 units CBG 151-200: 1 units CBG 201-250: 2 Units CBG 251-300: 3 units CBG 301-350: 4 units CBG 351-400: 5 units CBG > 400, please Call MD Patient taking differently: Inject 0-9 Units into the skin 3 (three) times daily with meals. Inject 3 units subcutaneously with breakfast and lunch, inject 2 units daily with supper; add coverage per CBG <150 add 0 units, 150-175 add 2 units, 176-200 add 4 units, >201 add 6 units 06/27/15  Yes Leana Roe Elgergawy, MD  insulin glargine (LANTUS) 100 UNIT/ML injection Inject 0.06 mLs (6 Units total) into the skin at bedtime. Patient taking differently: Inject 8 Units into the skin at bedtime.  06/27/15  Yes Starleen Arms, MD  lamoTRIgine (LAMICTAL) 25 MG tablet Take 50 mg by mouth at bedtime.   Yes Historical Provider, MD  LORazepam (ATIVAN) 0.5 MG tablet Take 1 tablet (0.5 mg total) by mouth at bedtime. Anxiety 06/27/15  Yes Starleen Arms, MD  Multiple Vitamin (MULITIVITAMIN WITH MINERALS) TABS Take  1 tablet by mouth daily.   Yes Historical Provider, MD  Multiple Vitamins-Minerals (DECUBI-VITE) CAPS Take 1 capsule by mouth daily.   Yes Historical Provider, MD  Nutritional Supplements (NUTRITIONAL SUPPLEMENT PO) Take 120 mLs by mouth 3 (three) times daily. Med Pass   Yes Historical Provider, MD  pantoprazole (PROTONIX) 40 MG tablet Take 1 tablet (40 mg total) by mouth 2 (two) times daily. Patient taking differently: Take 40 mg by mouth 2 (two) times daily. 8am, 4pm 04/28/15  Yes Shanker Levora Dredge, MD  polyethylene glycol (MIRALAX / GLYCOLAX) packet Take 17 g by mouth daily as needed for  mild constipation. Patient taking differently: Take 17 g by mouth daily as needed (constipation).  12/27/14  Yes Christiane Ha, MD  prazosin (MINIPRESS) 1 MG capsule Take 1 mg by mouth at bedtime.    Yes Historical Provider, MD  rosuvastatin (CRESTOR) 5 MG tablet Take 5 mg by mouth daily at 6 PM.    Yes Historical Provider, MD  sennosides-docusate sodium (SENOKOT-S) 8.6-50 MG tablet Take 2 tablets by mouth 2 (two) times daily. 8am, 5pm   Yes Historical Provider, MD  sertraline (ZOLOFT) 100 MG tablet Take 150 mg by mouth daily.   Yes Historical Provider, MD  traMADol (ULTRAM) 50 MG tablet Take 1 tablet (50 mg total) by mouth every 6 (six) hours as needed for moderate pain. 06/27/15  Yes Leana Roe Elgergawy, MD  feeding supplement, ENSURE ENLIVE, (ENSURE ENLIVE) LIQD Take 237 mLs by mouth 3 (three) times daily between meals. Patient not taking: Reported on 08/10/2015 06/27/15   Leana Roe Elgergawy, MD   BP 150/63 mmHg  Pulse 93  Temp(Src) 98.7 F (37.1 C) (Oral)  Resp 17  SpO2 98% Physical Exam  Constitutional: She is oriented to person, place, and time. She appears well-developed and well-nourished. No distress.  HENT:  Head: Normocephalic. Head is with contusion and with laceration.  Right Ear: Tympanic membrane, external ear and ear canal normal.  Left Ear: Tympanic membrane, external ear and ear canal  normal.  Nose: Nose normal. No epistaxis. Right sinus exhibits no maxillary sinus tenderness and no frontal sinus tenderness. Left sinus exhibits no maxillary sinus tenderness and no frontal sinus tenderness.  Mouth/Throat: Uvula is midline, oropharynx is clear and moist and mucous membranes are normal. Mucous membranes are not pale and not cyanotic. No oropharyngeal exudate, posterior oropharyngeal edema, posterior oropharyngeal erythema or tonsillar abscesses.  Small laceration to the right chin Large hematoma to the right side of the forehead Large skin tear to the left frontal region of the scalp  Eyes: Conjunctivae and EOM are normal. Pupils are equal, round, and reactive to light. No scleral icterus.  No horizontal, vertical or rotational nystagmus  Neck: Normal range of motion and full passive range of motion without pain. Neck supple.  Full active ROM without pain - c-collar placed No midline or paraspinal tenderness No nuchal rigidity or meningeal signs  Cardiovascular: Normal rate, regular rhythm, normal heart sounds and intact distal pulses.   Pulmonary/Chest: Effort normal and breath sounds normal. No stridor. No respiratory distress. She has no wheezes. She has no rales.  Clear and equal breath sounds  Abdominal: Soft. Bowel sounds are normal. There is no tenderness. There is no rebound and no guarding.  Soft and nontender  Genitourinary:  Stage II pressure sore of the bilateral buttock with small ulceration to the right buttock < 2cm  Musculoskeletal: Normal range of motion.  FROM of BUE and BLE without pain  Lymphadenopathy:    She has no cervical adenopathy.  Neurological: She is alert and oriented to person, place, and time. She has normal reflexes. No cranial nerve deficit. She exhibits normal muscle tone. Coordination normal.  Mental Status:  Alert, oriented, thought content appropriate. Speech fluent without evidence of aphasia. Able to follow 2 step commands without  difficulty.  Cranial Nerves:  II:  Peripheral visual fields grossly normal, pupils equal, round, reactive to light III,IV, VI: ptosis not present, extra-ocular motions intact bilaterally  V,VII: smile symmetric, facial light touch sensation equal VIII: hearing grossly normal bilaterally  IX,X: midline uvula rise  XI: bilateral shoulder shrug equal and strong XII: midline tongue extension  Motor:  5/5 in upper and lower extremities bilaterally including strong and equal grip strength and dorsiflexion/plantar flexion Sensory: Pinprick and light touch normal in all extremities.  Deep Tendon Reflexes: 2+ and symmetric  Cerebellar: normal finger-to-nose with bilateral upper extremities Gait: normal gait assisted by RN CV: distal pulses palpable throughout   Skin: Skin is warm and dry. No rash noted. She is not diaphoretic.  Abrasion to right shoulder  Psychiatric: She has a normal mood and affect. Her behavior is normal. Judgment and thought content normal.  Nursing note and vitals reviewed.   ED Course  Procedures (including critical care time) Labs Review Labs Reviewed  CBC - Abnormal; Notable for the following:    WBC 10.7 (*)    Hemoglobin 11.2 (*)    HCT 34.4 (*)    All other components within normal limits  BASIC METABOLIC PANEL - Abnormal; Notable for the following:    Chloride 98 (*)    Glucose, Bld 406 (*)    All other components within normal limits  URINALYSIS, ROUTINE W REFLEX MICROSCOPIC (NOT AT [facility]) - Abnormal; Notable for the following:    Glucose, UA >1000 (*)    Ketones, ur 40 (*)    All other components within normal limits  URINE MICROSCOPIC-ADD ON - Abnormal; Notable for the following:    Squamous Epithelial / LPF 0-5 (*)    All other components within normal limits  CBG MONITORING, ED - Abnormal; Notable for the following:    Glucose-Capillary 404 (*)    All other components within normal limits  CBG MONITORING, ED - Abnormal; Notable for the following:     Glucose-Capillary 259 (*)    All other components within normal limits    Imaging Review Ct Head Wo Contrast  08/10/2015  CLINICAL DATA:  Fall.  Laceration to left parietal region EXAM: CT HEAD WITHOUT CONTRAST CT CERVICAL SPINE WITHOUT CONTRAST TECHNIQUE: Multidetector CT imaging of the head and cervical spine was performed following the standard protocol without intravenous contrast. Multiplanar CT image reconstructions of the cervical spine were also generated. COMPARISON:  06/19/2015 FINDINGS: CT HEAD FINDINGS There is mild diffuse low-attenuation within the subcortical and periventricular white matter compatible with chronic microvascular disease. Prominence of the sulci and ventricles noted compatible with brain atrophy. No acute cortical infarct, hemorrhage, or mass lesion ispresent. No significant extra-axial fluid collection is present. The paranasal sinuses andmastoid air cells are clear. The osseous skull is intact. CT CERVICAL SPINE FINDINGS The alignment of the cervical spine appears normal. The vertebral body heights are well preserved. Anterior cervical disc fusion of C5-6 at has been performed. There appears to be solid fusion of these vertebra. Degenerative disc disease is noted at the C4-5 disc space. The facet joints are all well aligned. No fracture or dislocation identified. Calcified atherosclerotic disease involves the carotid arteries. Density within the superior segment of the left lower lobe is again noted. IMPRESSION: 1. No acute intracranial abnormalities. 2. Chronic microvascular disease and brain atrophy noted. 3. No evidence for cervical spine fracture. 4. Status post C5-6 anterior cervical disc fusion. Electronically Signed   By: Signa Kellaylor  Stroud M.D.   On: 08/10/2015 12:13   Ct Cervical Spine Wo Contrast  08/10/2015  CLINICAL DATA:  Fall.  Laceration to left parietal region EXAM: CT HEAD WITHOUT CONTRAST CT CERVICAL SPINE WITHOUT CONTRAST TECHNIQUE: Multidetector CT imaging  of the head and cervical spine was performed following  the standard protocol without intravenous contrast. Multiplanar CT image reconstructions of the cervical spine were also generated. COMPARISON:  06/19/2015 FINDINGS: CT HEAD FINDINGS There is mild diffuse low-attenuation within the subcortical and periventricular white matter compatible with chronic microvascular disease. Prominence of the sulci and ventricles noted compatible with brain atrophy. No acute cortical infarct, hemorrhage, or mass lesion ispresent. No significant extra-axial fluid collection is present. The paranasal sinuses andmastoid air cells are clear. The osseous skull is intact. CT CERVICAL SPINE FINDINGS The alignment of the cervical spine appears normal. The vertebral body heights are well preserved. Anterior cervical disc fusion of C5-6 at has been performed. There appears to be solid fusion of these vertebra. Degenerative disc disease is noted at the C4-5 disc space. The facet joints are all well aligned. No fracture or dislocation identified. Calcified atherosclerotic disease involves the carotid arteries. Density within the superior segment of the left lower lobe is again noted. IMPRESSION: 1. No acute intracranial abnormalities. 2. Chronic microvascular disease and brain atrophy noted. 3. No evidence for cervical spine fracture. 4. Status post C5-6 anterior cervical disc fusion. Electronically Signed   By: Signa Kell M.D.   On: 08/10/2015 12:13   I have personally reviewed and evaluated these images and lab results as part of my medical decision-making.   MDM   Final diagnoses:  Fall from slip, trip, or stumble, initial encounter  Pressure ulcer  Hyperglycemia  Abrasion of scalp, initial encounter  Contusion of scalp, initial encounter   Beth Payne presents after fall; No syncope. She is ambulatory in the department with assistance to her baseline. No syncopal episode.  Elevated CBG but no evidence of DKA. Normal  anion gap. Some ketones noted in her urine but no evidence of UTI. Patient given fluids and insulin. Repeat CBG 259.  Patient tolerates by mouth solids and fluids without difficulty.CT head and neck without acute abnormalities.  No neurologic deficits.  Wounds cleaned, abrasion on scalp does not require sutures.  Wound care instructions given to patient and listed on discharge paperwork.  The patient was discussed with and seen by Dr. Effie Shy who agrees with the treatment plan.   Dahlia Client Michaeleen Down, PA-C 08/10/15 1613  Mancel Bale, MD 08/10/15 [phone]

## 2015-08-11 DIAGNOSIS — F0390 Unspecified dementia without behavioral disturbance: Secondary | ICD-10-CM | POA: Diagnosis not present

## 2015-08-11 DIAGNOSIS — E109 Type 1 diabetes mellitus without complications: Secondary | ICD-10-CM | POA: Diagnosis not present

## 2015-08-11 DIAGNOSIS — I10 Essential (primary) hypertension: Secondary | ICD-10-CM | POA: Diagnosis not present

## 2015-08-15 DIAGNOSIS — R278 Other lack of coordination: Secondary | ICD-10-CM | POA: Diagnosis not present

## 2015-08-15 DIAGNOSIS — F0390 Unspecified dementia without behavioral disturbance: Secondary | ICD-10-CM | POA: Diagnosis not present

## 2015-08-15 DIAGNOSIS — L89152 Pressure ulcer of sacral region, stage 2: Secondary | ICD-10-CM | POA: Diagnosis not present

## 2015-08-15 DIAGNOSIS — M6281 Muscle weakness (generalized): Secondary | ICD-10-CM | POA: Diagnosis not present

## 2015-08-15 DIAGNOSIS — E1065 Type 1 diabetes mellitus with hyperglycemia: Secondary | ICD-10-CM | POA: Diagnosis not present

## 2015-08-15 DIAGNOSIS — I10 Essential (primary) hypertension: Secondary | ICD-10-CM | POA: Diagnosis not present

## 2015-08-18 DIAGNOSIS — M6281 Muscle weakness (generalized): Secondary | ICD-10-CM | POA: Diagnosis not present

## 2015-08-18 DIAGNOSIS — E1065 Type 1 diabetes mellitus with hyperglycemia: Secondary | ICD-10-CM | POA: Diagnosis not present

## 2015-08-18 DIAGNOSIS — R278 Other lack of coordination: Secondary | ICD-10-CM | POA: Diagnosis not present

## 2015-08-18 DIAGNOSIS — I10 Essential (primary) hypertension: Secondary | ICD-10-CM | POA: Diagnosis not present

## 2015-08-18 DIAGNOSIS — F0390 Unspecified dementia without behavioral disturbance: Secondary | ICD-10-CM | POA: Diagnosis not present

## 2015-08-18 DIAGNOSIS — L89152 Pressure ulcer of sacral region, stage 2: Secondary | ICD-10-CM | POA: Diagnosis not present

## 2015-08-19 DIAGNOSIS — L89152 Pressure ulcer of sacral region, stage 2: Secondary | ICD-10-CM | POA: Diagnosis not present

## 2015-08-19 DIAGNOSIS — M6281 Muscle weakness (generalized): Secondary | ICD-10-CM | POA: Diagnosis not present

## 2015-08-19 DIAGNOSIS — F0390 Unspecified dementia without behavioral disturbance: Secondary | ICD-10-CM | POA: Diagnosis not present

## 2015-08-19 DIAGNOSIS — R278 Other lack of coordination: Secondary | ICD-10-CM | POA: Diagnosis not present

## 2015-08-19 DIAGNOSIS — E1065 Type 1 diabetes mellitus with hyperglycemia: Secondary | ICD-10-CM | POA: Diagnosis not present

## 2015-08-19 DIAGNOSIS — I10 Essential (primary) hypertension: Secondary | ICD-10-CM | POA: Diagnosis not present

## 2015-08-20 DIAGNOSIS — R278 Other lack of coordination: Secondary | ICD-10-CM | POA: Diagnosis not present

## 2015-08-20 DIAGNOSIS — L89152 Pressure ulcer of sacral region, stage 2: Secondary | ICD-10-CM | POA: Diagnosis not present

## 2015-08-20 DIAGNOSIS — I10 Essential (primary) hypertension: Secondary | ICD-10-CM | POA: Diagnosis not present

## 2015-08-20 DIAGNOSIS — F0390 Unspecified dementia without behavioral disturbance: Secondary | ICD-10-CM | POA: Diagnosis not present

## 2015-08-20 DIAGNOSIS — M6281 Muscle weakness (generalized): Secondary | ICD-10-CM | POA: Diagnosis not present

## 2015-08-20 DIAGNOSIS — E1065 Type 1 diabetes mellitus with hyperglycemia: Secondary | ICD-10-CM | POA: Diagnosis not present

## 2015-08-21 DIAGNOSIS — M6281 Muscle weakness (generalized): Secondary | ICD-10-CM | POA: Diagnosis not present

## 2015-08-21 DIAGNOSIS — I10 Essential (primary) hypertension: Secondary | ICD-10-CM | POA: Diagnosis not present

## 2015-08-21 DIAGNOSIS — L89152 Pressure ulcer of sacral region, stage 2: Secondary | ICD-10-CM | POA: Diagnosis not present

## 2015-08-21 DIAGNOSIS — F0390 Unspecified dementia without behavioral disturbance: Secondary | ICD-10-CM | POA: Diagnosis not present

## 2015-08-21 DIAGNOSIS — E1065 Type 1 diabetes mellitus with hyperglycemia: Secondary | ICD-10-CM | POA: Diagnosis not present

## 2015-08-21 DIAGNOSIS — R278 Other lack of coordination: Secondary | ICD-10-CM | POA: Diagnosis not present

## 2015-08-22 DIAGNOSIS — F0390 Unspecified dementia without behavioral disturbance: Secondary | ICD-10-CM | POA: Diagnosis not present

## 2015-08-22 DIAGNOSIS — I10 Essential (primary) hypertension: Secondary | ICD-10-CM | POA: Diagnosis not present

## 2015-08-22 DIAGNOSIS — M6281 Muscle weakness (generalized): Secondary | ICD-10-CM | POA: Diagnosis not present

## 2015-08-22 DIAGNOSIS — E1065 Type 1 diabetes mellitus with hyperglycemia: Secondary | ICD-10-CM | POA: Diagnosis not present

## 2015-08-22 DIAGNOSIS — L89152 Pressure ulcer of sacral region, stage 2: Secondary | ICD-10-CM | POA: Diagnosis not present

## 2015-08-22 DIAGNOSIS — R278 Other lack of coordination: Secondary | ICD-10-CM | POA: Diagnosis not present

## 2015-08-25 DIAGNOSIS — M6281 Muscle weakness (generalized): Secondary | ICD-10-CM | POA: Diagnosis not present

## 2015-08-25 DIAGNOSIS — L89152 Pressure ulcer of sacral region, stage 2: Secondary | ICD-10-CM | POA: Diagnosis not present

## 2015-08-25 DIAGNOSIS — R278 Other lack of coordination: Secondary | ICD-10-CM | POA: Diagnosis not present

## 2015-08-25 DIAGNOSIS — I10 Essential (primary) hypertension: Secondary | ICD-10-CM | POA: Diagnosis not present

## 2015-08-25 DIAGNOSIS — F0390 Unspecified dementia without behavioral disturbance: Secondary | ICD-10-CM | POA: Diagnosis not present

## 2015-08-25 DIAGNOSIS — E1065 Type 1 diabetes mellitus with hyperglycemia: Secondary | ICD-10-CM | POA: Diagnosis not present

## 2015-08-27 DIAGNOSIS — M6281 Muscle weakness (generalized): Secondary | ICD-10-CM | POA: Diagnosis not present

## 2015-08-27 DIAGNOSIS — I10 Essential (primary) hypertension: Secondary | ICD-10-CM | POA: Diagnosis not present

## 2015-08-27 DIAGNOSIS — F0390 Unspecified dementia without behavioral disturbance: Secondary | ICD-10-CM | POA: Diagnosis not present

## 2015-08-27 DIAGNOSIS — L89152 Pressure ulcer of sacral region, stage 2: Secondary | ICD-10-CM | POA: Diagnosis not present

## 2015-08-27 DIAGNOSIS — R278 Other lack of coordination: Secondary | ICD-10-CM | POA: Diagnosis not present

## 2015-08-27 DIAGNOSIS — E1065 Type 1 diabetes mellitus with hyperglycemia: Secondary | ICD-10-CM | POA: Diagnosis not present

## 2015-08-29 DIAGNOSIS — F0390 Unspecified dementia without behavioral disturbance: Secondary | ICD-10-CM | POA: Diagnosis not present

## 2015-08-29 DIAGNOSIS — L89152 Pressure ulcer of sacral region, stage 2: Secondary | ICD-10-CM | POA: Diagnosis not present

## 2015-08-29 DIAGNOSIS — M6281 Muscle weakness (generalized): Secondary | ICD-10-CM | POA: Diagnosis not present

## 2015-08-29 DIAGNOSIS — R278 Other lack of coordination: Secondary | ICD-10-CM | POA: Diagnosis not present

## 2015-08-29 DIAGNOSIS — E1065 Type 1 diabetes mellitus with hyperglycemia: Secondary | ICD-10-CM | POA: Diagnosis not present

## 2015-08-29 DIAGNOSIS — I10 Essential (primary) hypertension: Secondary | ICD-10-CM | POA: Diagnosis not present

## 2015-09-01 DIAGNOSIS — R531 Weakness: Secondary | ICD-10-CM | POA: Diagnosis not present

## 2015-09-01 DIAGNOSIS — F0390 Unspecified dementia without behavioral disturbance: Secondary | ICD-10-CM | POA: Diagnosis not present

## 2015-09-01 DIAGNOSIS — I10 Essential (primary) hypertension: Secondary | ICD-10-CM | POA: Diagnosis not present

## 2015-09-01 DIAGNOSIS — E109 Type 1 diabetes mellitus without complications: Secondary | ICD-10-CM | POA: Diagnosis not present

## 2015-09-02 DIAGNOSIS — E440 Moderate protein-calorie malnutrition: Secondary | ICD-10-CM | POA: Diagnosis not present

## 2015-09-02 DIAGNOSIS — I2510 Atherosclerotic heart disease of native coronary artery without angina pectoris: Secondary | ICD-10-CM | POA: Diagnosis not present

## 2015-09-02 DIAGNOSIS — K219 Gastro-esophageal reflux disease without esophagitis: Secondary | ICD-10-CM | POA: Diagnosis not present

## 2015-09-02 DIAGNOSIS — I10 Essential (primary) hypertension: Secondary | ICD-10-CM | POA: Diagnosis not present

## 2015-09-02 DIAGNOSIS — M818 Other osteoporosis without current pathological fracture: Secondary | ICD-10-CM | POA: Diagnosis not present

## 2015-09-02 DIAGNOSIS — M6281 Muscle weakness (generalized): Secondary | ICD-10-CM | POA: Diagnosis not present

## 2015-09-02 DIAGNOSIS — G4089 Other seizures: Secondary | ICD-10-CM | POA: Diagnosis not present

## 2015-09-02 DIAGNOSIS — E041 Nontoxic single thyroid nodule: Secondary | ICD-10-CM | POA: Diagnosis not present

## 2015-09-02 DIAGNOSIS — E113519 Type 2 diabetes mellitus with proliferative diabetic retinopathy with macular edema, unspecified eye: Secondary | ICD-10-CM | POA: Diagnosis not present

## 2015-09-02 DIAGNOSIS — F3340 Major depressive disorder, recurrent, in remission, unspecified: Secondary | ICD-10-CM | POA: Diagnosis not present

## 2015-09-02 DIAGNOSIS — E784 Other hyperlipidemia: Secondary | ICD-10-CM | POA: Diagnosis not present

## 2015-09-02 DIAGNOSIS — D649 Anemia, unspecified: Secondary | ICD-10-CM | POA: Diagnosis not present

## 2015-09-03 DIAGNOSIS — R531 Weakness: Secondary | ICD-10-CM | POA: Diagnosis not present

## 2015-09-03 DIAGNOSIS — I10 Essential (primary) hypertension: Secondary | ICD-10-CM | POA: Diagnosis not present

## 2015-09-03 DIAGNOSIS — F0390 Unspecified dementia without behavioral disturbance: Secondary | ICD-10-CM | POA: Diagnosis not present

## 2015-09-03 DIAGNOSIS — E109 Type 1 diabetes mellitus without complications: Secondary | ICD-10-CM | POA: Diagnosis not present

## 2015-09-04 DIAGNOSIS — E109 Type 1 diabetes mellitus without complications: Secondary | ICD-10-CM | POA: Diagnosis not present

## 2015-09-04 DIAGNOSIS — R531 Weakness: Secondary | ICD-10-CM | POA: Diagnosis not present

## 2015-09-04 DIAGNOSIS — F0390 Unspecified dementia without behavioral disturbance: Secondary | ICD-10-CM | POA: Diagnosis not present

## 2015-09-04 DIAGNOSIS — I10 Essential (primary) hypertension: Secondary | ICD-10-CM | POA: Diagnosis not present

## 2015-09-11 DIAGNOSIS — I10 Essential (primary) hypertension: Secondary | ICD-10-CM | POA: Diagnosis not present

## 2015-09-11 DIAGNOSIS — E109 Type 1 diabetes mellitus without complications: Secondary | ICD-10-CM | POA: Diagnosis not present

## 2015-09-11 DIAGNOSIS — F0390 Unspecified dementia without behavioral disturbance: Secondary | ICD-10-CM | POA: Diagnosis not present

## 2015-09-16 DIAGNOSIS — M6281 Muscle weakness (generalized): Secondary | ICD-10-CM | POA: Diagnosis not present

## 2015-09-16 DIAGNOSIS — I10 Essential (primary) hypertension: Secondary | ICD-10-CM | POA: Diagnosis not present

## 2015-09-16 DIAGNOSIS — K219 Gastro-esophageal reflux disease without esophagitis: Secondary | ICD-10-CM | POA: Diagnosis not present

## 2015-09-16 DIAGNOSIS — M818 Other osteoporosis without current pathological fracture: Secondary | ICD-10-CM | POA: Diagnosis not present

## 2015-09-16 DIAGNOSIS — R296 Repeated falls: Secondary | ICD-10-CM | POA: Diagnosis not present

## 2015-09-16 DIAGNOSIS — F419 Anxiety disorder, unspecified: Secondary | ICD-10-CM | POA: Diagnosis not present

## 2015-09-16 DIAGNOSIS — E440 Moderate protein-calorie malnutrition: Secondary | ICD-10-CM | POA: Diagnosis not present

## 2015-09-16 DIAGNOSIS — E083559 Diabetes mellitus due to underlying condition with stable proliferative diabetic retinopathy, unspecified eye: Secondary | ICD-10-CM | POA: Diagnosis not present

## 2015-09-16 DIAGNOSIS — F329 Major depressive disorder, single episode, unspecified: Secondary | ICD-10-CM | POA: Diagnosis not present

## 2015-09-16 DIAGNOSIS — D638 Anemia in other chronic diseases classified elsewhere: Secondary | ICD-10-CM | POA: Diagnosis not present

## 2015-09-16 DIAGNOSIS — M14679 Charcot's joint, unspecified ankle and foot: Secondary | ICD-10-CM | POA: Diagnosis not present

## 2015-09-16 DIAGNOSIS — I2510 Atherosclerotic heart disease of native coronary artery without angina pectoris: Secondary | ICD-10-CM | POA: Diagnosis not present

## 2015-09-19 DIAGNOSIS — E119 Type 2 diabetes mellitus without complications: Secondary | ICD-10-CM | POA: Diagnosis not present

## 2015-09-19 DIAGNOSIS — M79671 Pain in right foot: Secondary | ICD-10-CM | POA: Diagnosis not present

## 2015-09-19 DIAGNOSIS — B351 Tinea unguium: Secondary | ICD-10-CM | POA: Diagnosis not present

## 2015-09-19 DIAGNOSIS — M79672 Pain in left foot: Secondary | ICD-10-CM | POA: Diagnosis not present

## 2015-09-30 DIAGNOSIS — E109 Type 1 diabetes mellitus without complications: Secondary | ICD-10-CM | POA: Diagnosis not present

## 2015-09-30 DIAGNOSIS — F0390 Unspecified dementia without behavioral disturbance: Secondary | ICD-10-CM | POA: Diagnosis not present

## 2015-09-30 DIAGNOSIS — I10 Essential (primary) hypertension: Secondary | ICD-10-CM | POA: Diagnosis not present

## 2015-10-01 DIAGNOSIS — E785 Hyperlipidemia, unspecified: Secondary | ICD-10-CM | POA: Diagnosis not present

## 2015-10-01 DIAGNOSIS — D649 Anemia, unspecified: Secondary | ICD-10-CM | POA: Diagnosis not present

## 2015-10-01 DIAGNOSIS — E119 Type 2 diabetes mellitus without complications: Secondary | ICD-10-CM | POA: Diagnosis not present

## 2015-10-01 DIAGNOSIS — Z79899 Other long term (current) drug therapy: Secondary | ICD-10-CM | POA: Diagnosis not present

## 2015-10-02 DIAGNOSIS — I10 Essential (primary) hypertension: Secondary | ICD-10-CM | POA: Diagnosis not present

## 2015-10-08 DIAGNOSIS — F0390 Unspecified dementia without behavioral disturbance: Secondary | ICD-10-CM | POA: Diagnosis not present

## 2015-10-08 DIAGNOSIS — R627 Adult failure to thrive: Secondary | ICD-10-CM | POA: Diagnosis not present

## 2015-10-08 DIAGNOSIS — I2510 Atherosclerotic heart disease of native coronary artery without angina pectoris: Secondary | ICD-10-CM | POA: Diagnosis not present

## 2015-10-08 DIAGNOSIS — K219 Gastro-esophageal reflux disease without esophagitis: Secondary | ICD-10-CM | POA: Diagnosis not present

## 2015-10-08 DIAGNOSIS — R296 Repeated falls: Secondary | ICD-10-CM | POA: Diagnosis not present

## 2015-10-08 DIAGNOSIS — E440 Moderate protein-calorie malnutrition: Secondary | ICD-10-CM | POA: Diagnosis not present

## 2015-10-08 DIAGNOSIS — E113599 Type 2 diabetes mellitus with proliferative diabetic retinopathy without macular edema, unspecified eye: Secondary | ICD-10-CM | POA: Diagnosis not present

## 2015-10-08 DIAGNOSIS — I10 Essential (primary) hypertension: Secondary | ICD-10-CM | POA: Diagnosis not present

## 2015-10-08 DIAGNOSIS — F3340 Major depressive disorder, recurrent, in remission, unspecified: Secondary | ICD-10-CM | POA: Diagnosis not present

## 2015-10-08 DIAGNOSIS — E1122 Type 2 diabetes mellitus with diabetic chronic kidney disease: Secondary | ICD-10-CM | POA: Diagnosis not present

## 2015-10-08 DIAGNOSIS — E133599 Other specified diabetes mellitus with proliferative diabetic retinopathy without macular edema, unspecified eye: Secondary | ICD-10-CM | POA: Diagnosis not present

## 2015-11-07 DIAGNOSIS — E119 Type 2 diabetes mellitus without complications: Secondary | ICD-10-CM | POA: Diagnosis not present

## 2015-11-07 DIAGNOSIS — D649 Anemia, unspecified: Secondary | ICD-10-CM | POA: Diagnosis not present

## 2015-11-14 DIAGNOSIS — M6281 Muscle weakness (generalized): Secondary | ICD-10-CM | POA: Diagnosis not present

## 2015-11-19 DIAGNOSIS — F0390 Unspecified dementia without behavioral disturbance: Secondary | ICD-10-CM | POA: Diagnosis not present

## 2015-11-19 DIAGNOSIS — E1010 Type 1 diabetes mellitus with ketoacidosis without coma: Secondary | ICD-10-CM | POA: Diagnosis not present

## 2015-11-19 DIAGNOSIS — M6281 Muscle weakness (generalized): Secondary | ICD-10-CM | POA: Diagnosis not present

## 2015-11-19 DIAGNOSIS — G4089 Other seizures: Secondary | ICD-10-CM | POA: Diagnosis not present

## 2015-11-19 DIAGNOSIS — N390 Urinary tract infection, site not specified: Secondary | ICD-10-CM | POA: Diagnosis not present

## 2015-11-19 DIAGNOSIS — R319 Hematuria, unspecified: Secondary | ICD-10-CM | POA: Diagnosis not present

## 2015-11-19 DIAGNOSIS — I2510 Atherosclerotic heart disease of native coronary artery without angina pectoris: Secondary | ICD-10-CM | POA: Diagnosis not present

## 2015-11-19 DIAGNOSIS — D649 Anemia, unspecified: Secondary | ICD-10-CM | POA: Diagnosis not present

## 2015-11-19 DIAGNOSIS — R9431 Abnormal electrocardiogram [ECG] [EKG]: Secondary | ICD-10-CM | POA: Diagnosis not present

## 2015-11-20 ENCOUNTER — Emergency Department (HOSPITAL_COMMUNITY): Payer: Medicare Other

## 2015-11-20 ENCOUNTER — Inpatient Hospital Stay (HOSPITAL_COMMUNITY): Payer: Medicare Other

## 2015-11-20 ENCOUNTER — Inpatient Hospital Stay (HOSPITAL_COMMUNITY)
Admission: EM | Admit: 2015-11-20 | Discharge: 2015-11-24 | DRG: 637 | Disposition: A | Discharge status: Skilled Nursing Facility | Payer: Medicare Other | Attending: Internal Medicine | Admitting: Internal Medicine

## 2015-11-20 ENCOUNTER — Encounter (HOSPITAL_COMMUNITY): Payer: Self-pay | Admitting: Emergency Medicine

## 2015-11-20 DIAGNOSIS — K219 Gastro-esophageal reflux disease without esophagitis: Secondary | ICD-10-CM | POA: Diagnosis present

## 2015-11-20 DIAGNOSIS — Z888 Allergy status to other drugs, medicaments and biological substances status: Secondary | ICD-10-CM

## 2015-11-20 DIAGNOSIS — I10 Essential (primary) hypertension: Secondary | ICD-10-CM | POA: Diagnosis present

## 2015-11-20 DIAGNOSIS — I2510 Atherosclerotic heart disease of native coronary artery without angina pectoris: Secondary | ICD-10-CM | POA: Diagnosis present

## 2015-11-20 DIAGNOSIS — R627 Adult failure to thrive: Secondary | ICD-10-CM | POA: Diagnosis not present

## 2015-11-20 DIAGNOSIS — F0390 Unspecified dementia without behavioral disturbance: Secondary | ICD-10-CM | POA: Diagnosis present

## 2015-11-20 DIAGNOSIS — Z794 Long term (current) use of insulin: Secondary | ICD-10-CM

## 2015-11-20 DIAGNOSIS — R64 Cachexia: Secondary | ICD-10-CM | POA: Diagnosis present

## 2015-11-20 DIAGNOSIS — Z955 Presence of coronary angioplasty implant and graft: Secondary | ICD-10-CM

## 2015-11-20 DIAGNOSIS — N179 Acute kidney failure, unspecified: Secondary | ICD-10-CM | POA: Diagnosis present

## 2015-11-20 DIAGNOSIS — E1010 Type 1 diabetes mellitus with ketoacidosis without coma: Secondary | ICD-10-CM | POA: Diagnosis not present

## 2015-11-20 DIAGNOSIS — E1165 Type 2 diabetes mellitus with hyperglycemia: Secondary | ICD-10-CM | POA: Diagnosis not present

## 2015-11-20 DIAGNOSIS — F329 Major depressive disorder, single episode, unspecified: Secondary | ICD-10-CM | POA: Diagnosis present

## 2015-11-20 DIAGNOSIS — E10319 Type 1 diabetes mellitus with unspecified diabetic retinopathy without macular edema: Secondary | ICD-10-CM | POA: Diagnosis present

## 2015-11-20 DIAGNOSIS — E784 Other hyperlipidemia: Secondary | ICD-10-CM | POA: Diagnosis not present

## 2015-11-20 DIAGNOSIS — E43 Unspecified severe protein-calorie malnutrition: Secondary | ICD-10-CM | POA: Diagnosis present

## 2015-11-20 DIAGNOSIS — R2689 Other abnormalities of gait and mobility: Secondary | ICD-10-CM | POA: Diagnosis not present

## 2015-11-20 DIAGNOSIS — Z22322 Carrier or suspected carrier of Methicillin resistant Staphylococcus aureus: Secondary | ICD-10-CM

## 2015-11-20 DIAGNOSIS — G9340 Encephalopathy, unspecified: Secondary | ICD-10-CM | POA: Diagnosis not present

## 2015-11-20 DIAGNOSIS — L8991 Pressure ulcer of unspecified site, stage 1: Secondary | ICD-10-CM | POA: Diagnosis present

## 2015-11-20 DIAGNOSIS — E1065 Type 1 diabetes mellitus with hyperglycemia: Secondary | ICD-10-CM | POA: Diagnosis not present

## 2015-11-20 DIAGNOSIS — L89152 Pressure ulcer of sacral region, stage 2: Secondary | ICD-10-CM | POA: Diagnosis present

## 2015-11-20 DIAGNOSIS — Z885 Allergy status to narcotic agent status: Secondary | ICD-10-CM

## 2015-11-20 DIAGNOSIS — Z79899 Other long term (current) drug therapy: Secondary | ICD-10-CM | POA: Diagnosis not present

## 2015-11-20 DIAGNOSIS — A419 Sepsis, unspecified organism: Secondary | ICD-10-CM | POA: Diagnosis not present

## 2015-11-20 DIAGNOSIS — E1110 Type 2 diabetes mellitus with ketoacidosis without coma: Secondary | ICD-10-CM | POA: Diagnosis present

## 2015-11-20 DIAGNOSIS — Z8673 Personal history of transient ischemic attack (TIA), and cerebral infarction without residual deficits: Secondary | ICD-10-CM

## 2015-11-20 DIAGNOSIS — E876 Hypokalemia: Secondary | ICD-10-CM | POA: Diagnosis present

## 2015-11-20 DIAGNOSIS — D649 Anemia, unspecified: Secondary | ICD-10-CM | POA: Diagnosis not present

## 2015-11-20 DIAGNOSIS — L8990 Pressure ulcer of unspecified site, unspecified stage: Secondary | ICD-10-CM

## 2015-11-20 DIAGNOSIS — R7309 Other abnormal glucose: Secondary | ICD-10-CM | POA: Diagnosis not present

## 2015-11-20 DIAGNOSIS — M8580 Other specified disorders of bone density and structure, unspecified site: Secondary | ICD-10-CM | POA: Diagnosis present

## 2015-11-20 DIAGNOSIS — F419 Anxiety disorder, unspecified: Secondary | ICD-10-CM | POA: Diagnosis present

## 2015-11-20 DIAGNOSIS — E785 Hyperlipidemia, unspecified: Secondary | ICD-10-CM | POA: Diagnosis present

## 2015-11-20 DIAGNOSIS — R109 Unspecified abdominal pain: Secondary | ICD-10-CM | POA: Diagnosis not present

## 2015-11-20 DIAGNOSIS — Z66 Do not resuscitate: Secondary | ICD-10-CM | POA: Diagnosis present

## 2015-11-20 DIAGNOSIS — M6281 Muscle weakness (generalized): Secondary | ICD-10-CM | POA: Diagnosis not present

## 2015-11-20 DIAGNOSIS — Z881 Allergy status to other antibiotic agents status: Secondary | ICD-10-CM

## 2015-11-20 DIAGNOSIS — E440 Moderate protein-calorie malnutrition: Secondary | ICD-10-CM | POA: Diagnosis not present

## 2015-11-20 DIAGNOSIS — F331 Major depressive disorder, recurrent, moderate: Secondary | ICD-10-CM | POA: Diagnosis not present

## 2015-11-20 DIAGNOSIS — E559 Vitamin D deficiency, unspecified: Secondary | ICD-10-CM | POA: Diagnosis not present

## 2015-11-20 DIAGNOSIS — F411 Generalized anxiety disorder: Secondary | ICD-10-CM | POA: Diagnosis not present

## 2015-11-20 DIAGNOSIS — M797 Fibromyalgia: Secondary | ICD-10-CM | POA: Diagnosis present

## 2015-11-20 DIAGNOSIS — R278 Other lack of coordination: Secondary | ICD-10-CM | POA: Diagnosis not present

## 2015-11-20 DIAGNOSIS — K5641 Fecal impaction: Secondary | ICD-10-CM | POA: Diagnosis present

## 2015-11-20 DIAGNOSIS — G4089 Other seizures: Secondary | ICD-10-CM | POA: Diagnosis not present

## 2015-11-20 DIAGNOSIS — E108 Type 1 diabetes mellitus with unspecified complications: Secondary | ICD-10-CM

## 2015-11-20 DIAGNOSIS — Z87891 Personal history of nicotine dependence: Secondary | ICD-10-CM | POA: Diagnosis not present

## 2015-11-20 DIAGNOSIS — E1051 Type 1 diabetes mellitus with diabetic peripheral angiopathy without gangrene: Secondary | ICD-10-CM | POA: Diagnosis present

## 2015-11-20 DIAGNOSIS — R404 Transient alteration of awareness: Secondary | ICD-10-CM | POA: Diagnosis not present

## 2015-11-20 DIAGNOSIS — R1084 Generalized abdominal pain: Secondary | ICD-10-CM | POA: Diagnosis not present

## 2015-11-20 DIAGNOSIS — E1310 Other specified diabetes mellitus with ketoacidosis without coma: Secondary | ICD-10-CM | POA: Diagnosis not present

## 2015-11-20 DIAGNOSIS — K3184 Gastroparesis: Secondary | ICD-10-CM | POA: Diagnosis not present

## 2015-11-20 HISTORY — DX: Non-ST elevation (NSTEMI) myocardial infarction: I21.4

## 2015-11-20 HISTORY — DX: Other fracture of unspecified lower leg, initial encounter for closed fracture: S82.899A

## 2015-11-20 LAB — CBC WITH DIFFERENTIAL/PLATELET
BASOS PCT: 0 %
Basophils Absolute: 0 10*3/uL (ref 0.0–0.1)
EOS ABS: 0 10*3/uL (ref 0.0–0.7)
EOS PCT: 0 %
HCT: 34.3 % — ABNORMAL LOW (ref 36.0–46.0)
Hemoglobin: 11.2 g/dL — ABNORMAL LOW (ref 12.0–15.0)
LYMPHS ABS: 1 10*3/uL (ref 0.7–4.0)
Lymphocytes Relative: 11 %
MCH: 27.3 pg (ref 26.0–34.0)
MCHC: 32.7 g/dL (ref 30.0–36.0)
MCV: 83.5 fL (ref 78.0–100.0)
Monocytes Absolute: 0.6 10*3/uL (ref 0.1–1.0)
Monocytes Relative: 7 %
NEUTROS PCT: 82 %
Neutro Abs: 7.3 10*3/uL (ref 1.7–7.7)
PLATELETS: 216 10*3/uL (ref 150–400)
RBC: 4.11 MIL/uL (ref 3.87–5.11)
RDW: 14.9 % (ref 11.5–15.5)
WBC: 8.9 10*3/uL (ref 4.0–10.5)

## 2015-11-20 LAB — I-STAT CG4 LACTIC ACID, ED: Lactic Acid, Venous: 1.53 mmol/L (ref 0.5–2.0)

## 2015-11-20 LAB — COMPREHENSIVE METABOLIC PANEL
ALK PHOS: 88 U/L (ref 38–126)
ALT: 99 U/L — AB (ref 14–54)
ANION GAP: 18 — AB (ref 5–15)
AST: 58 U/L — ABNORMAL HIGH (ref 15–41)
Albumin: 4.6 g/dL (ref 3.5–5.0)
BILIRUBIN TOTAL: 1.4 mg/dL — AB (ref 0.3–1.2)
BUN: 16 mg/dL (ref 6–20)
CALCIUM: 11.1 mg/dL — AB (ref 8.9–10.3)
CO2: 19 mmol/L — ABNORMAL LOW (ref 22–32)
CREATININE: 1.52 mg/dL — AB (ref 0.44–1.00)
Chloride: 107 mmol/L (ref 101–111)
GFR calc non Af Amer: 33 mL/min — ABNORMAL LOW (ref >60)
GFR, EST AFRICAN AMERICAN: 38 mL/min — AB (ref >60)
GLUCOSE: 338 mg/dL — AB (ref 65–99)
Potassium: 3.7 mmol/L (ref 3.5–5.1)
Sodium: 144 mmol/L (ref 135–145)
Total Protein: 7.6 g/dL (ref 6.5–8.1)

## 2015-11-20 LAB — I-STAT VENOUS BLOOD GAS, ED
BICARBONATE: 23.5 meq/L (ref 20.0–24.0)
O2 Saturation: 68 %
PCO2 VEN: 31.1 mmHg — AB (ref 45.0–50.0)
PH VEN: 7.484 — AB (ref 7.250–7.300)
Patient temperature: 98
TCO2: 24 mmol/L (ref 0–100)
pO2, Ven: 31 mmHg (ref 31.0–45.0)

## 2015-11-20 LAB — URINALYSIS, ROUTINE W REFLEX MICROSCOPIC
LEUKOCYTES UA: NEGATIVE
Nitrite: NEGATIVE
PH: 5.5 (ref 5.0–8.0)
PROTEIN: 30 mg/dL — AB
Specific Gravity, Urine: 1.025 (ref 1.005–1.030)

## 2015-11-20 LAB — URINE MICROSCOPIC-ADD ON

## 2015-11-20 LAB — I-STAT CHEM 8, ED
BUN: 18 mg/dL (ref 6–20)
CALCIUM ION: 1.4 mmol/L — AB (ref 1.13–1.30)
CREATININE: 0.7 mg/dL (ref 0.44–1.00)
Chloride: 109 mmol/L (ref 101–111)
GLUCOSE: 338 mg/dL — AB (ref 65–99)
HCT: 37 % (ref 36.0–46.0)
HEMOGLOBIN: 12.6 g/dL (ref 12.0–15.0)
POTASSIUM: 3.8 mmol/L (ref 3.5–5.1)
Sodium: 146 mmol/L — ABNORMAL HIGH (ref 135–145)
TCO2: 20 mmol/L (ref 0–100)

## 2015-11-20 LAB — CBG MONITORING, ED
GLUCOSE-CAPILLARY: 201 mg/dL — AB (ref 65–99)
GLUCOSE-CAPILLARY: 273 mg/dL — AB (ref 65–99)
GLUCOSE-CAPILLARY: 305 mg/dL — AB (ref 65–99)

## 2015-11-20 MED ORDER — DONEPEZIL HCL 10 MG PO TABS
10.0000 mg | ORAL_TABLET | Freq: Every day | ORAL | Status: DC
  Administered 2015-11-21 – 2015-11-23 (×4): 10 mg via ORAL
  Filled 2015-11-20: qty 2
  Filled 2015-11-20 (×3): qty 1

## 2015-11-20 MED ORDER — LAMOTRIGINE 25 MG PO TABS
50.0000 mg | ORAL_TABLET | Freq: Every day | ORAL | Status: DC
  Administered 2015-11-21 – 2015-11-23 (×4): 50 mg via ORAL
  Filled 2015-11-20 (×4): qty 2

## 2015-11-20 MED ORDER — DEXTROSE-NACL 5-0.45 % IV SOLN
INTRAVENOUS | Status: DC
  Administered 2015-11-21 – 2015-11-22 (×2): via INTRAVENOUS

## 2015-11-20 MED ORDER — AMLODIPINE BESYLATE 10 MG PO TABS
10.0000 mg | ORAL_TABLET | Freq: Every day | ORAL | Status: DC
  Administered 2015-11-22 – 2015-11-24 (×3): 10 mg via ORAL
  Filled 2015-11-20 (×4): qty 1

## 2015-11-20 MED ORDER — PANTOPRAZOLE SODIUM 40 MG PO TBEC
40.0000 mg | DELAYED_RELEASE_TABLET | Freq: Two times a day (BID) | ORAL | Status: DC
  Administered 2015-11-21 – 2015-11-24 (×7): 40 mg via ORAL
  Filled 2015-11-20 (×7): qty 1

## 2015-11-20 MED ORDER — HEPARIN SODIUM (PORCINE) 5000 UNIT/ML IJ SOLN
5000.0000 [IU] | Freq: Three times a day (TID) | INTRAMUSCULAR | Status: DC
  Administered 2015-11-21 – 2015-11-22 (×5): 5000 [IU] via SUBCUTANEOUS
  Filled 2015-11-20 (×4): qty 1

## 2015-11-20 MED ORDER — BISACODYL 5 MG PO TBEC
10.0000 mg | DELAYED_RELEASE_TABLET | Freq: Every day | ORAL | Status: DC | PRN
  Filled 2015-11-20: qty 2

## 2015-11-20 MED ORDER — POLYETHYLENE GLYCOL 3350 17 G PO PACK: 17.0000 g | PACK | Freq: Every day | ORAL | Status: DC | PRN

## 2015-11-20 MED ORDER — SODIUM CHLORIDE 0.9 % IV SOLN: INTRAVENOUS | Status: DC

## 2015-11-20 MED ORDER — LORAZEPAM 0.5 MG PO TABS
0.5000 mg | ORAL_TABLET | Freq: Every day | ORAL | Status: DC
  Administered 2015-11-21 – 2015-11-23 (×4): 0.5 mg via ORAL
  Filled 2015-11-20 (×4): qty 1

## 2015-11-20 MED ORDER — ARIPIPRAZOLE 5 MG PO TABS
5.0000 mg | ORAL_TABLET | Freq: Every morning | ORAL | Status: DC
  Administered 2015-11-22 – 2015-11-24 (×3): 5 mg via ORAL
  Filled 2015-11-20 (×4): qty 1

## 2015-11-20 MED ORDER — ROSUVASTATIN CALCIUM 10 MG PO TABS
5.0000 mg | ORAL_TABLET | Freq: Every day | ORAL | Status: DC
  Administered 2015-11-21 – 2015-11-23 (×2): 5 mg via ORAL
  Filled 2015-11-20: qty 1

## 2015-11-20 MED ORDER — POTASSIUM CHLORIDE 10 MEQ/100ML IV SOLN
10.0000 meq | INTRAVENOUS | Status: AC
  Administered 2015-11-20 – 2015-11-21 (×2): 10 meq via INTRAVENOUS
  Filled 2015-11-20 (×2): qty 100

## 2015-11-20 MED ORDER — FERROUS SULFATE 325 (65 FE) MG PO TABS
325.0000 mg | ORAL_TABLET | ORAL | Status: DC
  Administered 2015-11-21 – 2015-11-24 (×5): 325 mg via ORAL
  Filled 2015-11-20 (×2): qty 1

## 2015-11-20 MED ORDER — SODIUM CHLORIDE 0.9 % IV SOLN
INTRAVENOUS | Status: DC
  Administered 2015-11-20: 2.5 [IU]/h via INTRAVENOUS
  Filled 2015-11-20: qty 2.5

## 2015-11-20 MED ORDER — SODIUM CHLORIDE 0.9 % IV SOLN
INTRAVENOUS | Status: AC
  Administered 2015-11-20: via INTRAVENOUS

## 2015-11-20 MED ORDER — SERTRALINE HCL 50 MG PO TABS
50.0000 mg | ORAL_TABLET | Freq: Three times a day (TID) | ORAL | Status: DC
  Administered 2015-11-21 – 2015-11-24 (×10): 50 mg via ORAL
  Filled 2015-11-20 (×11): qty 1

## 2015-11-20 MED ORDER — SODIUM CHLORIDE 0.9 % IV SOLN
INTRAVENOUS | Status: DC
  Administered 2015-11-20: via INTRAVENOUS

## 2015-11-20 MED ORDER — SENNOSIDES-DOCUSATE SODIUM 8.6-50 MG PO TABS
2.0000 | ORAL_TABLET | Freq: Two times a day (BID) | ORAL | Status: DC
  Administered 2015-11-21 – 2015-11-23 (×5): 2 via ORAL
  Filled 2015-11-20 (×7): qty 2

## 2015-11-20 MED ORDER — PRAZOSIN HCL 1 MG PO CAPS
1.0000 mg | ORAL_CAPSULE | Freq: Every day | ORAL | Status: DC
  Administered 2015-11-21 – 2015-11-23 (×4): 1 mg via ORAL
  Filled 2015-11-20 (×4): qty 1

## 2015-11-20 NOTE — ED Provider Notes (Signed)
CSN: [id number]     Arrival date & time 11/20/15  1823 History  By signing my name below, I, [facility], attest that this documentation has been prepared under the direction and in the presence of Melene Plan, DO. Electronically Signed: Randell Patient, ED Scribe. 11/20/2015. 8:24 PM.   Chief Complaint  Patient presents with  . Altered Mental Status  . Fever  . Hyperglycemia     The history is provided by the EMS personnel and the patient. The history is limited by the condition of the patient. No language interpreter was used.    LEVEL V CAVEAT DUE TO ALTERED MENTAL STATUS  HPI Comments: Beth Payne is a 75 y.o. female brought in by EMS with an hx of dementia, DM, CAD, TIA, arthritis, GERD, fibromyalgia, and osteopenia who presents to the Emergency Department with altered mental satus. The history was provided by the nurse who spoke with EMS upon pt's arrival to the ED. Nurse states that the pt resides at [facility] and has been altered since yesterday. She states that her  CBGs have been 300s at the facility despite insulin treatment. She has been eating less for the past 2 days. She reports abdominal pain. Denies any other symptoms currently.   Past Medical History  Diagnosis Date  . Coronary artery disease   . Depression   . Retinopathy   . Anxiety   . Heart murmur   . TIA (transient ischemic attack)   . Arthritis     "fingers" (12/23/2014)  . Fibromyalgia   . Type I diabetes mellitus (HCC) dx'd 1953    "I'm on a pup" (12/23/2014)  . Chronic lower back pain   . GERD (gastroesophageal reflux disease) dx'd 1995  . Osteopenia dx'd 2007  . Dementia    Past Surgical History  Procedure Laterality Date  . Cataract extraction w/ intraocular lens  implant, bilateral Bilateral 1998  . Tonsillectomy    . Appendectomy    . Bilateral carpal tunnel release Bilateral 2003-2004  . Back surgery    . Anterior cervical decomp/discectomy fusion  05/2003    "C5-6"  .  Abdominal hysterectomy  1972  . Dilation and curettage of uterus    . Cesarean section    . Eye surgery Bilateral since 1977    "numerous slaser surgeries for retinopathy"  . Trigger finger release Bilateral 2003  . Cervical disc surgery  2004    "collapsed C-5"  . Fracture surgery    . Patella fracture surgery Left 04/2007  . Vertebroplasty  01/2008    "compressed lumbar fracture"  . Laparoscopic lysis of adhesions  1975-1987 X 5  . Exploratory laparotomy  1884-1660 X 1    "adhesions"  . Oophorectomy Bilateral 1980's  . Vitrectomy Right 08/1997  . Bunionectomy with hammertoe reconstruction Right 02/1999  . Coronary angioplasty with stent placement  07/1999    "?1"  . Lumbar microdiscectomy  03/2005  . Orif ankle fracture Left 12/25/2014    Procedure: OPEN REDUCTION INTERNAL FIXATION (ORIF) LEFT TRIMALLEOLAR ANKLE FRACTURE;  Surgeon: Tarry Kos, MD;  Location: [facility] OR;  Service: Orthopedics;  Laterality: Left;   Family History  Problem Relation Age of Onset  . Liver disease Sister   . Colon cancer Brother    Social History  Substance Use Topics  . Smoking status: Former Smoker -- 1.00 packs/day for 27 years    Types: Cigarettes  . Smokeless tobacco: Never Used     Comment: "quit smoking cigarettes in 1985  .  Alcohol Use: No   OB History    No data available     Review of Systems  Unable to perform ROS: Mental status change     Allergies  Diclofenac sodium; Doxycycline calcium; Codeine; Zocor; and Calcium-containing compounds  Home Medications   Prior to Admission medications   Medication Sig Start Date End Date Taking? Authorizing Provider  amLODipine (NORVASC) 10 MG tablet Take 1 tablet (10 mg total) by mouth daily. 05/22/15  Yes Richarda Overlie, MD  ARIPiprazole (ABILIFY) 5 MG tablet Take 1 tablet (5 mg total) by mouth every morning. 04/28/15  Yes Shanker Levora Dredge, MD  bisacodyl (DULCOLAX) 5 MG EC tablet Take 10 mg by mouth daily as needed for mild constipation or  moderate constipation.   Yes Historical Provider, MD  cholecalciferol (VITAMIN D) 1000 units tablet Take 2,000 Units by mouth daily.   Yes Historical Provider, MD  donepezil (ARICEPT) 10 MG tablet Take 10 mg by mouth at bedtime.   Yes Historical Provider, MD  ferrous sulfate 325 (65 FE) MG tablet Take 325 mg by mouth 2 (two) times daily. 8am, 5pm   Yes Historical Provider, MD  insulin glargine (LANTUS) 100 UNIT/ML injection Inject 4 Units into the skin See admin instructions. Inject 4 units subcutaneous every morning, then inject 4 units subcutaneous at bedtime per [facility]   Yes Historical Provider, MD  lamoTRIgine (LAMICTAL) 25 MG tablet Take 50 mg by mouth at bedtime.   Yes Historical Provider, MD  LORazepam (ATIVAN) 0.5 MG tablet Take 1 tablet (0.5 mg total) by mouth at bedtime. Anxiety 06/27/15  Yes Leana Roe Elgergawy, MD  pantoprazole (PROTONIX) 40 MG tablet Take 1 tablet (40 mg total) by mouth 2 (two) times daily. Patient taking differently: Take 40 mg by mouth 2 (two) times daily. 8am, 4pm 04/28/15  Yes Shanker Levora Dredge, MD  polyethylene glycol (MIRALAX / GLYCOLAX) packet Take 17 g by mouth daily as needed for mild constipation. Patient taking differently: Take 17 g by mouth daily as needed (constipation).  12/27/14  Yes Christiane Ha, MD  prazosin (MINIPRESS) 1 MG capsule Take 1 mg by mouth at bedtime.    Yes Historical Provider, MD  rosuvastatin (CRESTOR) 5 MG tablet Take 5 mg by mouth daily at 6 PM.    Yes Historical Provider, MD  sennosides-docusate sodium (SENOKOT-S) 8.6-50 MG tablet Take 2 tablets by mouth 2 (two) times daily. 8am, 5pm   Yes Historical Provider, MD  sertraline (ZOLOFT) 50 MG tablet Take 50 mg by mouth 3 (three) times daily.   Yes Historical Provider, MD  traMADol (ULTRAM) 50 MG tablet Take 1 tablet (50 mg total) by mouth every 6 (six) hours as needed for moderate pain. 06/27/15  Yes Leana Roe Elgergawy, MD  feeding supplement, ENSURE ENLIVE, (ENSURE ENLIVE) LIQD Take 237  mLs by mouth 3 (three) times daily between meals. Patient not taking: Reported on 08/10/2015 06/27/15   Leana Roe Elgergawy, MD  insulin aspart (NOVOLOG) 100 UNIT/ML injection Inject 0-5 Units into the skin 3 (three) times daily with meals. CBG 70-150:  0 units CBG 151-200: 1 units CBG 201-250: 2 Units CBG 251-300: 3 units CBG 301-350: 4 units CBG 351-400: 5 units CBG > 400, please Call MD Patient taking differently: Inject 0-9 Units into the skin 3 (three) times daily with meals. Inject 3 units subcutaneously with breakfast and lunch, inject 2 units daily with supper; add coverage per CBG <150 add 0 units, 150-175 add 2 units, 176-200 add 4 units, >  201 add 6 units 06/27/15   Starleen Arms, MD  insulin glargine (LANTUS) 100 UNIT/ML injection Inject 0.06 mLs (6 Units total) into the skin at bedtime. Patient not taking: Reported on 11/20/2015 06/27/15   Leana Roe Elgergawy, MD   BP 153/92 mmHg  Pulse 102  Temp(Src) 99.8 F (37.7 C) (Axillary)  Resp 14  SpO2 98% Physical Exam  Constitutional: She appears cachectic.  HENT:  Head: Normocephalic and atraumatic.  Eyes: EOM are normal. Pupils are equal, round, and reactive to light.  Neck: Normal range of motion. Neck supple.  Cardiovascular: Normal rate and regular rhythm.  Exam reveals no gallop and no friction rub.   No murmur heard. Pulmonary/Chest: Effort normal. She has no wheezes. She has no rales.  Lungs CTA bilaterally.  Abdominal: Soft. She exhibits no distension. There is tenderness. There is guarding.  Diffuse guarding throughout abdomen.  Musculoskeletal: She exhibits no edema or tenderness.  Neurological: She is alert.  Skin: Skin is warm and dry. She is not diaphoretic.  Slight yellow coloration to skin. Erythematous region to base of sacrum that is blanching.  Psychiatric: She has a normal mood and affect. Her behavior is normal.  Nursing note and vitals reviewed.   ED Course  Procedures (including critical care  time)  DIAGNOSTIC STUDIES:   COORDINATION OF CARE: 6:39 PM Discussed treatment plan with pt at bedside and pt agreed to plan.   Labs Review Labs Reviewed  COMPREHENSIVE METABOLIC PANEL - Abnormal; Notable for the following:    CO2 19 (*)    Glucose, Bld 338 (*)    Creatinine, Ser 1.52 (*)    Calcium 11.1 (*)    AST 58 (*)    ALT 99 (*)    Total Bilirubin 1.4 (*)    GFR calc non Af Amer 33 (*)    GFR calc Af Amer 38 (*)    Anion gap 18 (*)    All other components within normal limits  URINALYSIS, ROUTINE W REFLEX MICROSCOPIC (NOT AT [facility]) - Abnormal; Notable for the following:    Glucose, UA >1000 (*)    Hgb urine dipstick MODERATE (*)    Bilirubin Urine MODERATE (*)    Ketones, ur >80 (*)    Protein, ur 30 (*)    All other components within normal limits  CBC WITH DIFFERENTIAL/PLATELET - Abnormal; Notable for the following:    Hemoglobin 11.2 (*)    HCT 34.3 (*)    All other components within normal limits  URINE MICROSCOPIC-ADD ON - Abnormal; Notable for the following:    Squamous Epithelial / LPF 0-5 (*)    Bacteria, UA RARE (*)    All other components within normal limits  I-STAT CHEM 8, ED - Abnormal; Notable for the following:    Sodium 146 (*)    Glucose, Bld 338 (*)    Calcium, Ion 1.40 (*)    All other components within normal limits  CBG MONITORING, ED - Abnormal; Notable for the following:    Glucose-Capillary 305 (*)    All other components within normal limits  I-STAT VENOUS BLOOD GAS, ED - Abnormal; Notable for the following:    pH, Ven 7.484 (*)    pCO2, Ven 31.1 (*)    All other components within normal limits  CBG MONITORING, ED - Abnormal; Notable for the following:    Glucose-Capillary 273 (*)    All other components within normal limits  CBG MONITORING, ED - Abnormal; Notable for the following:  Glucose-Capillary 201 (*)    All other components within normal limits  CULTURE, BLOOD (ROUTINE X 2)  CULTURE, BLOOD (ROUTINE X 2)  URINE CULTURE   CBC WITH DIFFERENTIAL/PLATELET  BLOOD GAS, VENOUS  HEPATIC FUNCTION PANEL  I-STAT CG4 LACTIC ACID, ED    Imaging Review Dg Chest 2 View  11/20/2015  CLINICAL DATA:  Sepsis EXAM: CHEST  2 VIEW COMPARISON:  06/20/2015 FINDINGS: The heart size and mediastinal contours are within normal limits. Both lungs are clear. Lingular scarring is again noted. The visualized skeletal structures show changes of prior vertebral augmentation. IMPRESSION: No active cardiopulmonary disease. Electronically Signed   By: Alcide CleverMark  Lukens M.D.   On: 11/20/2015 19:26   Dg Abd 1 View  11/20/2015  CLINICAL DATA:  Abdominal pain. EXAM: ABDOMEN - 1 VIEW COMPARISON:  CT chest abdomen and pelvis 06/20/2015 FINDINGS: Prominent stool-filled rectum. Remainder the colon is mostly decompressed with scattered gas and stool present. No small or large bowel distention. No radiopaque stones. Vascular calcifications. Degenerative changes in the spine and hips. IMPRESSION: Prominent stool in the rectum may indicate impaction. No evidence of bowel obstruction. Electronically Signed   By: Burman NievesWilliam  Stevens M.D.   On: 11/20/2015 23:56   I have personally reviewed and evaluated these images and lab results as part of my medical decision-making.   EKG Interpretation None      MDM   Final diagnoses:  Diabetic ketoacidosis without coma associated with type 1 diabetes mellitus (HCC)    75 yo F With a chief complaints of altered mental status. Per her nursing home she has not been at her normal mental state for the past 2 days. When picked up by EMS was concerned that she was febrile. Temperature is 99.6 year rectally. Patient only responding yes to me on exam. Is alert. No focal findings on exam. Labs consistent with diabetic ketoacidosis. Patient has had multiple these in the past. Difficulty with obtaining a venous blood gas, patient has greater than 80 ketones in the urine and a bicarbonate of 19. Anion gap is 18. Patient was started on an  insulin drip.  CXR and UA negative for infection.  Will admit.   The patients results and plan were reviewed and discussed.   Any x-rays performed were independently reviewed by myself.   Differential diagnosis were considered with the presenting HPI.  Medications  insulin regular (NOVOLIN R,HUMULIN R) 250 Units in sodium chloride 0.9 % 250 mL (1 Units/mL) infusion (4.3 Units/hr Intravenous Rate/Dose Change 11/20/15 2303)  0.9 %  sodium chloride infusion ( Intravenous New Bag/Given 11/20/15 2357)  dextrose 5 %-0.45 % sodium chloride infusion (not administered)  heparin injection 5,000 Units (not administered)  potassium chloride 10 mEq in 100 mL IVPB (10 mEq Intravenous New Bag/Given 11/20/15 2356)  0.9 %  sodium chloride infusion ( Intravenous New Bag/Given 11/20/15 2343)  amLODipine (NORVASC) tablet 10 mg (not administered)  ARIPiprazole (ABILIFY) tablet 5 mg (not administered)  bisacodyl (DULCOLAX) EC tablet 10 mg (not administered)  donepezil (ARICEPT) tablet 10 mg (not administered)  ferrous sulfate tablet 325 mg (not administered)  lamoTRIgine (LAMICTAL) tablet 50 mg (not administered)  LORazepam (ATIVAN) tablet 0.5 mg (not administered)  polyethylene glycol (MIRALAX / GLYCOLAX) packet 17 g (not administered)  pantoprazole (PROTONIX) EC tablet 40 mg (not administered)  prazosin (MINIPRESS) capsule 1 mg (not administered)  rosuvastatin (CRESTOR) tablet 5 mg (not administered)  senna-docusate (Senokot-S) tablet 2 tablet (not administered)  sertraline (ZOLOFT) tablet 50 mg (not administered)  Filed Vitals:   11/20/15 2115 11/20/15 2145 11/20/15 2215 11/20/15 2300  BP: 156/66 165/81 162/74 153/92  Pulse: 81 105 96 102  Temp:      TempSrc:      Resp:    14  SpO2: 98% 98% 97% 98%    Final diagnoses:  Diabetic ketoacidosis without coma associated with type 1 diabetes mellitus (HCC)    Admission/ observation were discussed with the admitting physician, patient and/or family  and they are comfortable with the plan.     I personally performed the services described in this documentation, which was scribed in my presence. The recorded information has been reviewed and is accurate.     Melene Plan, DO 11/21/15 0000

## 2015-11-20 NOTE — ED Notes (Signed)
CBG 343 on assessment in the ED.

## 2015-11-20 NOTE — ED Notes (Signed)
CBG 143  

## 2015-11-20 NOTE — ED Notes (Signed)
EMS - Patient coming from [place] facility with c/o altered mental status more so than her current dementia, fever and hyperglycemia.  Patient given 8 units insulin with CBG 388 at facility.  Given 300cc normal saline with EMS.  Patient has not been eating well ongoing since yesterday.

## 2015-11-20 NOTE — ED Notes (Signed)
CBG: 305 

## 2015-11-20 NOTE — ED Notes (Signed)
VBG walked down to mini lab

## 2015-11-20 NOTE — H&P (Signed)
History and Physical    Beth Payne ZOX:[id number] DOB: 10/23/1940 DOA: 11/20/2015   PCP: Julian HySOUTH,STEPHEN ALAN, MD Chief Complaint:  Chief Complaint  Patient presents with  . Altered Mental Status  . Fever  . Hyperglycemia    HPI: Beth Aristahomasine C Schlitt is a 75 y.o. female with medical history significant of dementia, currently lives in SNF, brittle DM1, frequent admits for DKA, almost always has AMS compared to baseline when admitted to hospital for DKA (actually this will be my 3rd time admitting her and I do not believe I have ever seen her at "baseline").  Today patient isnt oriented to much of anything, although she is awake, eye opening spontaneous, and will track me around the room.  She isnt providing much in the way of history once again today.  ED Course: Found once again to have mild DKA with anion gap of 18, BGL in the 300s-400s.  She also has a stage 2 decubitus ulcer.  Tm 99.8 with no obvious source of infection, leukocytosis, tachycardia or other SIRS.  Review of Systems: unable to perform due to AMS.   Past Medical History  Diagnosis Date  . Coronary artery disease   . Depression   . Retinopathy   . Anxiety   . Heart murmur   . TIA (transient ischemic attack)   . Arthritis     "fingers" (12/23/2014)  . Fibromyalgia   . Type I diabetes mellitus (HCC) dx'd 1953    "I'm on a pup" (12/23/2014)  . Chronic lower back pain   . GERD (gastroesophageal reflux disease) dx'd 1995  . Osteopenia dx'd 2007  . Dementia     Past Surgical History  Procedure Laterality Date  . Cataract extraction w/ intraocular lens  implant, bilateral Bilateral 1998  . Tonsillectomy    . Appendectomy    . Bilateral carpal tunnel release Bilateral 2003-2004  . Back surgery    . Anterior cervical decomp/discectomy fusion  05/2003    "C5-6"  . Abdominal hysterectomy  1972  . Dilation and curettage of uterus    . Cesarean section    . Eye surgery Bilateral since 1977    "numerous slaser  surgeries for retinopathy"  . Trigger finger release Bilateral 2003  . Cervical disc surgery  2004    "collapsed C-5"  . Fracture surgery    . Patella fracture surgery Left 04/2007  . Vertebroplasty  01/2008    "compressed lumbar fracture"  . Laparoscopic lysis of adhesions  1975-1987 X 5  . Exploratory laparotomy  [id number] X 1    "adhesions"  . Oophorectomy Bilateral 1980's  . Vitrectomy Right 08/1997  . Bunionectomy with hammertoe reconstruction Right 02/1999  . Coronary angioplasty with stent placement  07/1999    "?1"  . Lumbar microdiscectomy  03/2005  . Orif ankle fracture Left 12/25/2014    Procedure: OPEN REDUCTION INTERNAL FIXATION (ORIF) LEFT TRIMALLEOLAR ANKLE FRACTURE;  Surgeon: Tarry KosNaiping M Xu, MD;  Location: [facility] OR;  Service: Orthopedics;  Laterality: Left;     reports that she has quit smoking. Her smoking use included Cigarettes. She has a 27 pack-year smoking history. She has never used smokeless tobacco. She reports that she does not drink alcohol or use illicit drugs.  Allergies  Allergen Reactions  . Diclofenac Sodium Swelling and Other (See Comments)    Blurred vision  . Doxycycline Calcium Swelling and Other (See Comments)    Blurred vision  . Codeine Nausea And Vomiting and Other (See Comments)  Dizziness   . Zocor [Simvastatin] Other (See Comments)    Dizziness, weakness   . Calcium-Containing Compounds Other (See Comments)    Calcium listed as an allergy on [facility] 08/10/15    Family History  Problem Relation Age of Onset  . Liver disease Sister   . Colon cancer Brother      Prior to Admission medications   Medication Sig Start Date End Date Taking? Authorizing Provider  amLODipine (NORVASC) 10 MG tablet Take 1 tablet (10 mg total) by mouth daily. 05/22/15  Yes Richarda Overlie, MD  ARIPiprazole (ABILIFY) 5 MG tablet Take 1 tablet (5 mg total) by mouth every morning. 04/28/15  Yes Shanker Levora Dredge, MD  bisacodyl (DULCOLAX) 5 MG EC tablet Take 10 mg by mouth  daily as needed for mild constipation or moderate constipation.   Yes Historical Provider, MD  cholecalciferol (VITAMIN D) 1000 units tablet Take 2,000 Units by mouth daily.   Yes Historical Provider, MD  donepezil (ARICEPT) 10 MG tablet Take 10 mg by mouth at bedtime.   Yes Historical Provider, MD  ferrous sulfate 325 (65 FE) MG tablet Take 325 mg by mouth 2 (two) times daily. 8am, 5pm   Yes Historical Provider, MD  insulin glargine (LANTUS) 100 UNIT/ML injection Inject 4 Units into the skin See admin instructions. Inject 4 units subcutaneous every morning, then inject 4 units subcutaneous at bedtime per [facility]   Yes Historical Provider, MD  lamoTRIgine (LAMICTAL) 25 MG tablet Take 50 mg by mouth at bedtime.   Yes Historical Provider, MD  LORazepam (ATIVAN) 0.5 MG tablet Take 1 tablet (0.5 mg total) by mouth at bedtime. Anxiety 06/27/15  Yes Leana Roe Elgergawy, MD  pantoprazole (PROTONIX) 40 MG tablet Take 1 tablet (40 mg total) by mouth 2 (two) times daily. Patient taking differently: Take 40 mg by mouth 2 (two) times daily. 8am, 4pm 04/28/15  Yes Shanker Levora Dredge, MD  polyethylene glycol (MIRALAX / GLYCOLAX) packet Take 17 g by mouth daily as needed for mild constipation. Patient taking differently: Take 17 g by mouth daily as needed (constipation).  12/27/14  Yes Christiane Ha, MD  prazosin (MINIPRESS) 1 MG capsule Take 1 mg by mouth at bedtime.    Yes Historical Provider, MD  rosuvastatin (CRESTOR) 5 MG tablet Take 5 mg by mouth daily at 6 PM.    Yes Historical Provider, MD  sennosides-docusate sodium (SENOKOT-S) 8.6-50 MG tablet Take 2 tablets by mouth 2 (two) times daily. 8am, 5pm   Yes Historical Provider, MD  sertraline (ZOLOFT) 50 MG tablet Take 50 mg by mouth 3 (three) times daily.   Yes Historical Provider, MD  traMADol (ULTRAM) 50 MG tablet Take 1 tablet (50 mg total) by mouth every 6 (six) hours as needed for moderate pain. 06/27/15  Yes Leana Roe Elgergawy, MD  feeding supplement,  ENSURE ENLIVE, (ENSURE ENLIVE) LIQD Take 237 mLs by mouth 3 (three) times daily between meals. Patient not taking: Reported on 08/10/2015 06/27/15   Leana Roe Elgergawy, MD  insulin aspart (NOVOLOG) 100 UNIT/ML injection Inject 0-5 Units into the skin 3 (three) times daily with meals. CBG 70-150:  0 units CBG 151-200: 1 units CBG 201-250: 2 Units CBG 251-300: 3 units CBG 301-350: 4 units CBG 351-400: 5 units CBG > 400, please Call MD Patient taking differently: Inject 0-9 Units into the skin 3 (three) times daily with meals. Inject 3 units subcutaneously with breakfast and lunch, inject 2 units daily with supper; add coverage per CBG <  150 add 0 units, 150-175 add 2 units, 176-200 add 4 units, >201 add 6 units 06/27/15   Leana Roe Elgergawy, MD  insulin glargine (LANTUS) 100 UNIT/ML injection Inject 0.06 mLs (6 Units total) into the skin at bedtime. Patient not taking: Reported on 11/20/2015 06/27/15   Starleen Arms, MD    Physical Exam: Daiva Eves:   11/20/15 2045 11/20/15 2115 11/20/15 2145 11/20/15 2215  BP: 118/55 156/66 165/81 162/74  Pulse: 81 81 105 96  Temp:      TempSrc:      Resp:      SpO2: 98% 98% 98% 97%      Constitutional: NAD, calm, comfortable Eyes: PERRL, lids and conjunctivae normal ENMT: Mucous membranes are moist. Posterior pharynx clear of any exudate or lesions.Normal dentition.  Neck: normal, supple, no masses, no thyromegaly Respiratory: clear to auscultation bilaterally, no wheezing, no crackles. Normal respiratory effort. No accessory muscle use.  Cardiovascular: Regular rate and rhythm, no murmurs / rubs / gallops. No extremity edema. 2+ pedal pulses. No carotid bruits.  Abdomen: Diffuse tenderness, but not really guarding on my exam, no masses palpated. No hepatosplenomegaly. Bowel sounds positive.  Musculoskeletal: no clubbing / cyanosis. No joint deformity upper and lower extremities. Good ROM, no contractures. Normal muscle tone.  Skin: no rashes,  lesions, ulcers. No induration, Erythema to base of sacrum. Neurologic: CN 2-12 grossly intact. MAE, minimally verbal at this time saying only "yes" to questions,  Psychiatric: Normal judgment and insight. Alert and oriented x 3. Normal mood.    Labs on Admission: I have personally reviewed following labs and imaging studies  CBC:  Recent Labs Lab 11/20/15 1853 11/20/15 1955  WBC  --  8.9  NEUTROABS  --  7.3  HGB 12.6 11.2*  HCT 37.0 34.3*  MCV  --  83.5  PLT  --  216   Basic Metabolic Panel:  Recent Labs Lab 11/20/15 1835 11/20/15 1853  NA 144 146*  K 3.7 3.8  CL 107 109  CO2 19*  --   GLUCOSE 338* 338*  BUN 16 18  CREATININE 1.52* 0.70  CALCIUM 11.1*  --    GFR: CrCl cannot be calculated (Unknown ideal weight.). Liver Function Tests:  Recent Labs Lab 11/20/15 1835  AST 58*  ALT 99*  ALKPHOS 88  BILITOT 1.4*  PROT 7.6  ALBUMIN 4.6   No results for input(s): LIPASE, AMYLASE in the last 168 hours. No results for input(s): AMMONIA in the last 168 hours. Coagulation Profile: No results for input(s): INR, PROTIME in the last 168 hours. Cardiac Enzymes: No results for input(s): CKTOTAL, CKMB, CKMBINDEX, TROPONINI in the last 168 hours. BNP (last 3 results) No results for input(s): PROBNP in the last 8760 hours. HbA1C: No results for input(s): HGBA1C in the last 72 hours. CBG:  Recent Labs Lab 11/20/15 2147  GLUCAP 305*   Lipid Profile: No results for input(s): CHOL, HDL, LDLCALC, TRIG, CHOLHDL, LDLDIRECT in the last 72 hours. Thyroid Function Tests: No results for input(s): TSH, T4TOTAL, FREET4, T3FREE, THYROIDAB in the last 72 hours. Anemia Panel: No results for input(s): VITAMINB12, FOLATE, FERRITIN, TIBC, IRON, RETICCTPCT in the last 72 hours. Urine analysis:    Component Value Date/Time   COLORURINE YELLOW 11/20/2015 2011   APPEARANCEUR CLEAR 11/20/2015 2011   LABSPEC 1.025 11/20/2015 2011   PHURINE 5.5 11/20/2015 2011   GLUCOSEU >1000*  11/20/2015 2011   HGBUR MODERATE* 11/20/2015 2011   BILIRUBINUR MODERATE* 11/20/2015 2011   KETONESUR >80*  11/20/2015 2011   PROTEINUR 30* 11/20/2015 2011   UROBILINOGEN 0.2 02/23/2015 1433   NITRITE NEGATIVE 11/20/2015 2011   LEUKOCYTESUR NEGATIVE 11/20/2015 2011   Sepsis Labs: @LABRCNTIP (procalcitonin:4,lacticidven:4) )No results found for this or any previous visit (from the past 240 hour(s)).   Radiological Exams on Admission: Dg Chest 2 View  11/20/2015  CLINICAL DATA:  Sepsis EXAM: CHEST  2 VIEW COMPARISON:  06/20/2015 FINDINGS: The heart size and mediastinal contours are within normal limits. Both lungs are clear. Lingular scarring is again noted. The visualized skeletal structures show changes of prior vertebral augmentation. IMPRESSION: No active cardiopulmonary disease. Electronically Signed   By: Alcide Clever M.D.   On: 11/20/2015 19:26    EKG: Independently reviewed.  Assessment/Plan Principal Problem:   DKA (diabetic ketoacidoses) (HCC) Active Problems:   Acute encephalopathy   AKI (acute kidney injury) (HCC)   Pressure ulcer   Dementia   Abdominal pain  DKA -  DKA pathway  Insulin gtt  Serial BMPs  IVF  Acute encephalopathy on chronic end stage dementia - patient is almost always minimally verbal at best at time of admission going back through numerous admits over the past year.  Believe this is delirium secondary to DKA.  Pressure ulcer - wound care consult  Abdominal pain -  Getting KUB  No guarding nor rebound on my exam  Does have slightly elevated LFTs, but review of history always shows that these run slightly elevated  If this persists or worsens then needs CT abd/pelvis  Repeat LFTs ordered with AM labs  AKI - likely dehydration associated with #1, serial BMPs ordered    DVT prophylaxis: Heparin [place] Code Status: DNR Family Communication: No family in room Consults called: None Admission status: Admit to inpatient   Hillary Bow  DO [facility] Pager [phone] from 7PM-7AM  If 7AM-7PM, please contact the day physician for the patient [email or web address] Password [facility]  11/20/2015, 10:46 PM

## 2015-11-20 NOTE — ED Notes (Signed)
Lab states CBC that was sent was clotted.  Will add new order and request phlebotomy to come and draw

## 2015-11-21 ENCOUNTER — Encounter (HOSPITAL_COMMUNITY): Payer: Self-pay | Admitting: General Practice

## 2015-11-21 LAB — BASIC METABOLIC PANEL
ANION GAP: 13 (ref 5–15)
ANION GAP: 8 (ref 5–15)
ANION GAP: 11 (ref 5–15)
ANION GAP: 7 (ref 5–15)
BUN: 11 mg/dL (ref 6–20)
BUN: 9 mg/dL (ref 6–20)
BUN: 8 mg/dL (ref 6–20)
BUN: 6 mg/dL (ref 6–20)
CALCIUM: 10.5 mg/dL — AB (ref 8.9–10.3)
CALCIUM: 9.9 mg/dL (ref 8.9–10.3)
CALCIUM: 10.2 mg/dL (ref 8.9–10.3)
CALCIUM: 9.7 mg/dL (ref 8.9–10.3)
CO2: 20 mmol/L — ABNORMAL LOW (ref 22–32)
CO2: 22 mmol/L (ref 22–32)
CO2: 21 mmol/L — ABNORMAL LOW (ref 22–32)
CO2: 23 mmol/L (ref 22–32)
CREATININE: 0.8 mg/dL (ref 0.44–1.00)
Chloride: 115 mmol/L — ABNORMAL HIGH (ref 101–111)
Chloride: 116 mmol/L — ABNORMAL HIGH (ref 101–111)
Chloride: 113 mmol/L — ABNORMAL HIGH (ref 101–111)
Chloride: 113 mmol/L — ABNORMAL HIGH (ref 101–111)
Creatinine, Ser: 1.07 mg/dL — ABNORMAL HIGH (ref 0.44–1.00)
Creatinine, Ser: 0.86 mg/dL (ref 0.44–1.00)
Creatinine, Ser: 1.04 mg/dL — ABNORMAL HIGH (ref 0.44–1.00)
GFR calc Af Amer: 60 mL/min — ABNORMAL LOW (ref >60)
GFR calc Af Amer: >60 mL/min (ref >60)
GFR, EST AFRICAN AMERICAN: 58 mL/min — AB (ref >60)
GFR, EST NON AFRICAN AMERICAN: 50 mL/min — AB (ref >60)
GFR, EST NON AFRICAN AMERICAN: 52 mL/min — AB (ref >60)
GLUCOSE: 151 mg/dL — AB (ref 65–99)
GLUCOSE: 250 mg/dL — AB (ref 65–99)
GLUCOSE: 178 mg/dL — AB (ref 65–99)
Glucose, Bld: 200 mg/dL — ABNORMAL HIGH (ref 65–99)
Potassium: 3.2 mmol/L — ABNORMAL LOW (ref 3.5–5.1)
Potassium: 3.2 mmol/L — ABNORMAL LOW (ref 3.5–5.1)
Potassium: 2.7 mmol/L — CL (ref 3.5–5.1)
Potassium: 2.9 mmol/L — ABNORMAL LOW (ref 3.5–5.1)
Sodium: 148 mmol/L — ABNORMAL HIGH (ref 135–145)
Sodium: 146 mmol/L — ABNORMAL HIGH (ref 135–145)
Sodium: 145 mmol/L (ref 135–145)
Sodium: 143 mmol/L (ref 135–145)

## 2015-11-21 LAB — HEPATIC FUNCTION PANEL
ALBUMIN: 4 g/dL (ref 3.5–5.0)
ALT: 82 U/L — ABNORMAL HIGH (ref 14–54)
AST: 49 U/L — AB (ref 15–41)
Alkaline Phosphatase: 82 U/L (ref 38–126)
Bilirubin, Direct: 0.1 mg/dL (ref 0.1–0.5)
Indirect Bilirubin: 1 mg/dL — ABNORMAL HIGH (ref 0.3–0.9)
Total Bilirubin: 1.1 mg/dL (ref 0.3–1.2)
Total Protein: 6.7 g/dL (ref 6.5–8.1)

## 2015-11-21 LAB — GLUCOSE, CAPILLARY
GLUCOSE-CAPILLARY: 137 mg/dL — AB (ref 65–99)
GLUCOSE-CAPILLARY: 171 mg/dL — AB (ref 65–99)
GLUCOSE-CAPILLARY: 133 mg/dL — AB (ref 65–99)
GLUCOSE-CAPILLARY: 161 mg/dL — AB (ref 65–99)
GLUCOSE-CAPILLARY: 160 mg/dL — AB (ref 65–99)
Glucose-Capillary: 343 mg/dL — ABNORMAL HIGH (ref 65–99)
Glucose-Capillary: 167 mg/dL — ABNORMAL HIGH (ref 65–99)
Glucose-Capillary: 191 mg/dL — ABNORMAL HIGH (ref 65–99)
Glucose-Capillary: 133 mg/dL — ABNORMAL HIGH (ref 65–99)
Glucose-Capillary: 131 mg/dL — ABNORMAL HIGH (ref 65–99)
Glucose-Capillary: 191 mg/dL — ABNORMAL HIGH (ref 65–99)
Glucose-Capillary: 248 mg/dL — ABNORMAL HIGH (ref 65–99)
Glucose-Capillary: 140 mg/dL — ABNORMAL HIGH (ref 65–99)
Glucose-Capillary: 171 mg/dL — ABNORMAL HIGH (ref 65–99)
Glucose-Capillary: 211 mg/dL — ABNORMAL HIGH (ref 65–99)
Glucose-Capillary: 133 mg/dL — ABNORMAL HIGH (ref 65–99)

## 2015-11-21 LAB — MAGNESIUM: MAGNESIUM: 1.8 mg/dL (ref 1.7–2.4)

## 2015-11-21 LAB — CBG MONITORING, ED
GLUCOSE-CAPILLARY: 94 mg/dL (ref 65–99)
GLUCOSE-CAPILLARY: 130 mg/dL — AB (ref 65–99)
Glucose-Capillary: 153 mg/dL — ABNORMAL HIGH (ref 65–99)

## 2015-11-21 MED ORDER — PRO-STAT SUGAR FREE PO LIQD: 30.0000 mL | Freq: Two times a day (BID) | ORAL | Status: DC

## 2015-11-21 MED ORDER — INSULIN ASPART 100 UNIT/ML ~~LOC~~ SOLN: 0.0000 [IU] | Freq: Every day | SUBCUTANEOUS | Status: DC

## 2015-11-21 MED ORDER — POTASSIUM CHLORIDE 10 MEQ/100ML IV SOLN
10.0000 meq | INTRAVENOUS | Status: AC
  Administered 2015-11-21 (×4): 10 meq via INTRAVENOUS
  Filled 2015-11-21 (×4): qty 100

## 2015-11-21 MED ORDER — ENSURE ENLIVE PO LIQD: 237.0000 mL | Freq: Three times a day (TID) | ORAL | Status: DC

## 2015-11-21 MED ORDER — BISACODYL 10 MG RE SUPP
10.0000 mg | Freq: Once | RECTAL | Status: DC
  Filled 2015-11-21: qty 1

## 2015-11-21 MED ORDER — INSULIN GLARGINE 100 UNIT/ML ~~LOC~~ SOLN
10.0000 [IU] | Freq: Once | SUBCUTANEOUS | Status: AC
  Administered 2015-11-21: 10 [IU] via SUBCUTANEOUS
  Filled 2015-11-21: qty 0.1

## 2015-11-21 MED ORDER — POTASSIUM CHLORIDE 10 MEQ/100ML IV SOLN
10.0000 meq | INTRAVENOUS | Status: AC
  Administered 2015-11-21 (×2): 10 meq via INTRAVENOUS
  Filled 2015-11-21 (×2): qty 100

## 2015-11-21 MED ORDER — INSULIN ASPART 100 UNIT/ML ~~LOC~~ SOLN
0.0000 [IU] | Freq: Three times a day (TID) | SUBCUTANEOUS | Status: DC
  Administered 2015-11-22: 2 [IU] via SUBCUTANEOUS
  Administered 2015-11-22: 3 [IU] via SUBCUTANEOUS
  Administered 2015-11-23: 1 [IU] via SUBCUTANEOUS
  Administered 2015-11-23: 2 [IU] via SUBCUTANEOUS
  Administered 2015-11-23: 5 [IU] via SUBCUTANEOUS
  Administered 2015-11-24: 3 [IU] via SUBCUTANEOUS

## 2015-11-21 MED ORDER — INSULIN GLARGINE 100 UNIT/ML ~~LOC~~ SOLN
10.0000 [IU] | Freq: Every day | SUBCUTANEOUS | Status: DC
  Administered 2015-11-22: 10 [IU] via SUBCUTANEOUS
  Filled 2015-11-21 (×2): qty 0.1

## 2015-11-21 NOTE — ED Notes (Signed)
Per MD Julian ReilGardner. Keep insulin regular at 0.3.

## 2015-11-21 NOTE — Progress Notes (Signed)
Utilization review completed.  

## 2015-11-21 NOTE — Progress Notes (Signed)
[facility]                                                                              Patient Demographics  Beth Payne, is a 75 y.o. female, DOB - 01-08-1941, WUJ:[id number]  Admit date - 11/20/2015   Admitting Physician Hillary Bow, DO  Outpatient Primary MD for the patient is [name], MD  Outpatient specialists:   LOS - 1  days    Chief Complaint  Patient presents with  . Altered Mental Status  . Fever  . Hyperglycemia       Brief summary   Patient is a 76 year old female with significant eventual, lives in skilled nursing facility, brittle diabetes mellitus type 1, frequent admits for DKA, acute encephalopathy, unable to provide any history. Patient has multiple admissions with DKA and acute encephalopathy. She was found to have anion gap of 18, blood sugars in 300-400 range with UA positive for ketones. Patient also has stage II decub, temp 99.8. Patient was admitted for further workup.   Assessment & Plan    Principal Problem:   DKA (diabetic ketoacidoses) (HCC) - Currently NPO, IV fluid hydration, insulin drip - SLP evaluation for swallowing -Once placed on diet, will transition to subcutaneous insulin   Active Problems:   Acute encephalopathy with underlying history of severe dementia  - Patient awake and tracking, spontaneously moves extremities but not following commands.  - No imagings in ED done, if no significant improvement in next 24 hours, will obtain MRI for further workup    AKI (acute kidney injury) (HCC)With severe dehydration, ketones - Continue IV fluid hydration with insulin drip,    Pressure ulcer - Wound care consult    Dementia -Once able to tolerate diet, will restart psych medications     Abdominal pain - KUB in ED showed prominent stool in the rectum may indicate impaction now bowel obstruction - On exam currently, no abdominal tenderness - Likely due to fecal impaction, placed on Dulcolax  suppository   Code Status: DNR  DVT Prophylaxis: * heparin  Family Communication: Discussed in detail with the patient, all imaging results, lab results explained to the patient   Disposition Plan:   Time Spent in minutes 25 minutes  Procedures:  NONE   Consultants:   None   Antimicrobials:      Medications  Scheduled Meds: . amLODipine  10 mg Oral Daily  . ARIPiprazole  5 mg Oral q morning - 10a  . bisacodyl  10 mg Rectal Once  . donepezil  10 mg Oral QHS  . ferrous sulfate  325 mg Oral 2 times per day  . heparin  5,000 Units Subcutaneous Q8H  . lamoTRIgine  50 mg Oral QHS  . LORazepam  0.5 mg Oral QHS  . pantoprazole  40 mg Oral BID  . prazosin  1 mg Oral QHS  . rosuvastatin  5 mg Oral q1800  . senna-docusate  2 tablet Oral BID  . sertraline  50 mg Oral TID   Continuous Infusions: . sodium chloride 125 mL/hr at 11/20/15 2357  . dextrose 5 % and 0.45% NaCl 75 mL/hr at 11/21/15  0005  . insulin (NOVOLIN-R) infusion Stopped (11/21/15 0635)   PRN Meds:.bisacodyl, polyethylene glycol   Antibiotics   Anti-infectives    None        Subjective:   Beth Payne was seen and examined today. Confused, difficult to obtain review of system, tracking with eyes, spontaneously moves extremities. No fevers or chills.    Objective:   Filed Vitals:   11/21/15 0100 11/21/15 0156 11/21/15 0200 11/21/15 0300  BP: 166/74  158/73 153/56  Pulse: 95  102 101  Temp:  98.9 F (37.2 C)  98.4 F (36.9 C)  TempSrc:  Axillary  Axillary  Resp: 21  23 20   SpO2: 98%  98% 100%    Intake/Output Summary (Last 24 hours) at 11/21/15 1101 Last data filed at 11/21/15 0933  Gross per 24 hour  Intake      0 ml  Output      0 ml  Net      0 ml     Wt Readings from Last 3 Encounters:  06/21/15 40.9 kg (90 lb 2.7 oz)  05/20/15 44.4 kg (97 lb 14.2 oz)  04/29/15 48.353 kg (106 lb 9.6 oz)     Exam  General: Confused, appears comfortable, NAD  HEENT:    Neck: Supple, no  JVD  Cardiovascular: S1 S2 auscultated, no rubs, murmurs or gallops. Regular rate and rhythm.  Respiratory: Clear to auscultation bilaterally, no wheezing, rales or rhonchi  Gastrointestinal: Soft, nontender, nondistended, + bowel sounds  Ext: no cyanosis clubbing or edema  Neuro: unable to assess   Skin:   Psych: Confused   Data Reviewed:  I have personally reviewed following labs and imaging studies  Micro Results No results found for this or any previous visit (from the past 240 hour(s)).  Radiology Reports Dg Chest 2 View  11/20/2015  CLINICAL DATA:  Sepsis EXAM: CHEST  2 VIEW COMPARISON:  06/20/2015 FINDINGS: The heart size and mediastinal contours are within normal limits. Both lungs are clear. Lingular scarring is again noted. The visualized skeletal structures show changes of prior vertebral augmentation. IMPRESSION: No active cardiopulmonary disease. Electronically Signed   By: Alcide CleverMark  Lukens M.D.   On: 11/20/2015 19:26   Dg Abd 1 View  11/20/2015  CLINICAL DATA:  Abdominal pain. EXAM: ABDOMEN - 1 VIEW COMPARISON:  CT chest abdomen and pelvis 06/20/2015 FINDINGS: Prominent stool-filled rectum. Remainder the colon is mostly decompressed with scattered gas and stool present. No small or large bowel distention. No radiopaque stones. Vascular calcifications. Degenerative changes in the spine and hips. IMPRESSION: Prominent stool in the rectum may indicate impaction. No evidence of bowel obstruction. Electronically Signed   By: Burman NievesWilliam  Stevens M.D.   On: 11/20/2015 23:56    Lab Data:  CBC:  Recent Labs Lab 11/20/15 1853 11/20/15 1955  WBC  --  8.9  NEUTROABS  --  7.3  HGB 12.6 11.2*  HCT 37.0 34.3*  MCV  --  83.5  PLT  --  216   Basic Metabolic Panel:  Recent Labs Lab 11/20/15 1835 11/20/15 1853 11/21/15 0404 11/21/15 0806  NA 144 146* 148* 146*  K 3.7 3.8 3.2* 3.2*  CL 107 109 115* 116*  CO2 19*  --  20* 22  GLUCOSE 338* 338* 200* 151*  BUN 16 18 11 9     CREATININE 1.52* 0.70 1.07* 0.86  CALCIUM 11.1*  --  10.5* 9.9   GFR: CrCl cannot be calculated (Unknown ideal weight.). Liver Function Tests:  Recent Labs  Lab 11/20/15 1835 11/21/15 0217  AST 58* 49*  ALT 99* 82*  ALKPHOS 88 82  BILITOT 1.4* 1.1  PROT 7.6 6.7  ALBUMIN 4.6 4.0   No results for input(s): LIPASE, AMYLASE in the last 168 hours. No results for input(s): AMMONIA in the last 168 hours. Coagulation Profile: No results for input(s): INR, PROTIME in the last 168 hours. Cardiac Enzymes: No results for input(s): CKTOTAL, CKMB, CKMBINDEX, TROPONINI in the last 168 hours. BNP (last 3 results) No results for input(s): PROBNP in the last 8760 hours. HbA1C: No results for input(s): HGBA1C in the last 72 hours. CBG:  Recent Labs Lab 11/21/15 0538 11/21/15 0634 11/21/15 0744 11/21/15 0925 11/21/15 1045  GLUCAP 137* 133* 131* 191* 248*   Lipid Profile: No results for input(s): CHOL, HDL, LDLCALC, TRIG, CHOLHDL, LDLDIRECT in the last 72 hours. Thyroid Function Tests: No results for input(s): TSH, T4TOTAL, FREET4, T3FREE, THYROIDAB in the last 72 hours. Anemia Panel: No results for input(s): VITAMINB12, FOLATE, FERRITIN, TIBC, IRON, RETICCTPCT in the last 72 hours. Urine analysis:    Component Value Date/Time   COLORURINE YELLOW 11/20/2015 2011   APPEARANCEUR CLEAR 11/20/2015 2011   LABSPEC 1.025 11/20/2015 2011   PHURINE 5.5 11/20/2015 2011   GLUCOSEU >1000* 11/20/2015 2011   HGBUR MODERATE* 11/20/2015 2011   BILIRUBINUR MODERATE* 11/20/2015 2011   KETONESUR >80* 11/20/2015 2011   PROTEINUR 30* 11/20/2015 2011   UROBILINOGEN 0.2 02/23/2015 1433   NITRITE NEGATIVE 11/20/2015 2011   LEUKOCYTESUR NEGATIVE 11/20/2015 2011     Daymon Hora M.D. [facility] 11/21/2015, 11:01 AM  Pager: [phone] Between 7am to 7pm - call Pager - [phone]  After 7pm go to [email or web address] - password [email or web address]  Call night coverage person covering after 7pm

## 2015-11-21 NOTE — Evaluation (Signed)
Clinical/Bedside Swallow Evaluation Patient Details  Name: Beth Payne MRN: [id number] Date of Birth: 01/15/1941  Today's Date: 11/21/2015 Time: SLP Start Time (ACUTE ONLY): 1428 SLP Stop Time (ACUTE ONLY): 1444 SLP Time Calculation (min) (ACUTE ONLY): 16 min  Past Medical History:  Past Medical History  Diagnosis Date  . Coronary artery disease   . Depression   . Retinopathy   . Anxiety   . Heart murmur   . TIA (transient ischemic attack)   . Arthritis     "fingers" (12/23/2014)  . Fibromyalgia   . Type I diabetes mellitus (HCC) dx'd 1953    "I'm on a pup" (12/23/2014)  . Chronic lower back pain   . GERD (gastroesophageal reflux disease) dx'd 1995  . Osteopenia dx'd 2007  . Dementia    Past Surgical History:  Past Surgical History  Procedure Laterality Date  . Cataract extraction w/ intraocular lens  implant, bilateral Bilateral 1998  . Tonsillectomy    . Appendectomy    . Bilateral carpal tunnel release Bilateral 2003-2004  . Back surgery    . Anterior cervical decomp/discectomy fusion  05/2003    "C5-6"  . Abdominal hysterectomy  1972  . Dilation and curettage of uterus    . Cesarean section    . Eye surgery Bilateral since 1977    "numerous slaser surgeries for retinopathy"  . Trigger finger release Bilateral 2003  . Cervical disc surgery  2004    "collapsed C-5"  . Fracture surgery    . Patella fracture surgery Left 04/2007  . Vertebroplasty  01/2008    "compressed lumbar fracture"  . Laparoscopic lysis of adhesions  1975-1987 X 5  . Exploratory laparotomy  [id number] X 1    "adhesions"  . Oophorectomy Bilateral 1980's  . Vitrectomy Right 08/1997  . Bunionectomy with hammertoe reconstruction Right 02/1999  . Coronary angioplasty with stent placement  07/1999    "?1"  . Lumbar microdiscectomy  03/2005  . Orif ankle fracture Left 12/25/2014    Procedure: OPEN REDUCTION INTERNAL FIXATION (ORIF) LEFT TRIMALLEOLAR ANKLE FRACTURE;  Surgeon: Tarry KosNaiping M Xu, MD;   Location: [facility] OR;  Service: Orthopedics;  Laterality: Left;   HPI:  Pt is a 75 y.o. female with PMH of dementia, GERD, currently lives in SNF, brittle DM1, frequent admits for DKA, almost always has AMS compared to baseline when admitted to hospital for DKA, admitted with acute encephalopathy on chronic end stage dementia, fever, hyperglycemia. Reportedly minimally verbal upon admission on 6/15. CXR was clear. BSE ordered to determine safest diet given current AMS.    Assessment / Plan / Recommendation Clinical Impression  Pt with no overt s/s of aspiration at bedside. Pt did require max cues/ encouragement to consume several sips of thin liquid with hand over hand assist; suspect that swallow initiation is mildly delayed and pt did have some anterior spillage out of R side. Attempted straw sip but pt unable to form adequate labial seal to suck from straw at this time. Pt consumed 2 small bites of puree without s/s of aspiration and refused anymore PO intake after that. No residuals/ pocketing noted but would still be concerned for this given current lethargy. Given current cognitive status aspiration risk appears moderate. Recommend initiating dysphagia 1 diet, thin liquids, meds crushed in puree, full supervision to assist with feeding, monitor alertness during meals. Suspect that pt will be able to advance diet as cognitive status improves. Will continue to follow for diet check/ consider advancement.  Aspiration Risk  Moderate aspiration risk    Diet Recommendation Dysphagia 1 (Puree);Thin liquid   Liquid Administration via: Cup Medication Administration: Crushed with puree Supervision: Staff to assist with self feeding;Full supervision/cueing for compensatory strategies Compensations: Slow rate;Small sips/bites;Minimize environmental distractions;Monitor for anterior loss Postural Changes: Seated upright at 90 degrees    Other  Recommendations Oral Care Recommendations: Oral care BID    Follow up Recommendations  Other (comment) (TBD)    Frequency and Duration min 2x/week  1 week       Prognosis Prognosis for Safe Diet Advancement: Good Barriers to Reach Goals: Cognitive deficits      Swallow Study   General HPI: Pt is a 75 y.o. female with PMH of dementia, GERD, currently lives in SNF, brittle DM1, frequent admits for DKA, almost always has AMS compared to baseline when admitted to hospital for DKA, admitted with acute encephalopathy on chronic end stage dementia, fever, hyperglycemia. Reportedly minimally verbal upon admission on 6/15. CXR was clear. BSE ordered to determine safest diet given current AMS.  Type of Study: Bedside Swallow Evaluation Previous Swallow Assessment: BSE 2016- dys 2/ thins rec'd Diet Prior to this Study: NPO Temperature Spikes Noted: Yes Respiratory Status: Room air History of Recent Intubation: No Behavior/Cognition: Lethargic/Drowsy;Requires cueing Oral Cavity Assessment: Within Functional Limits Oral Cavity - Dentition: Adequate natural dentition Vision:  (difficult to assess) Self-Feeding Abilities: Needs assist Patient Positioning: Upright in bed Baseline Vocal Quality: Breathy Volitional Cough: Cognitively unable to elicit Volitional Swallow: Unable to elicit    Oral/Motor/Sensory Function Overall Oral Motor/Sensory Function: Other (comment) (difficult to assess d/t difficulty following directions)   Ice Chips Ice chips: Not tested   Thin Liquid Thin Liquid: Impaired Presentation: Cup;Straw Oral Phase Impairments: Reduced labial seal Oral Phase Functional Implications: Right anterior spillage Pharyngeal  Phase Impairments: Suspected delayed Swallow    Nectar Thick Nectar Thick Liquid: Not tested   Honey Thick Honey Thick Liquid: Not tested   Puree Puree: Impaired Presentation: Spoon Oral Phase Functional Implications: Prolonged oral transit Pharyngeal Phase Impairments: Suspected delayed Swallow   Solid   GO    Solid: Not tested        Metro Kung, MA, CCC-SLP 11/21/2015,2:54 PM [phone]

## 2015-11-21 NOTE — ED Notes (Signed)
Attempted report x 2 

## 2015-11-21 NOTE — Progress Notes (Signed)
Noted that patient is on IV insulin drip.  Will need basal and correction scale insulin when transitioning off drip.  Patient takes Lantus 4 units daily at home and has been on Novolog SENSITIVE correction scale at home.  In addition, has been getting Novolog 3 units with breakfast and lunch, and then Novolog 2 units at supper as meal coverage. This is per pharmacy review. Will need basal insulin 2 hours before stopping insulin drip and then Novolog correction scale at time of discontinuing the IV insulin. Smith MinceKendra Yoko Mcgahee RN BSN CDE

## 2015-11-21 NOTE — ED Notes (Signed)
Attempted Report 

## 2015-11-21 NOTE — Progress Notes (Signed)
Ordering KCl x 2 runs for potassium of 3.2 on latest BMP.

## 2015-11-21 NOTE — Consult Note (Signed)
WOC wound consult note Reason for Consult: Consult requested for sacrum.  Pt is very emaciated, with protruding sacral bone, and incontinent;  with multiple systemic factors which may cause the wound to decline. Wound type: Stage 2 pressure injury Pressure Ulcer POA: Yes Measurement: 2X.3X.1cm Wound bed: pink and moist Drainage (amount, consistency, odor) no odor or drainage Dressing procedure/placement/frequency: Nutrition consult to optimize protein intake.  Air mattress to reduce pressure.  Foam dressing to protect and promote healing. Please re-consult if further assistance is needed.  Thank-you,  Cammie Mcgeeawn Daysia Vandenboom MSN, RN, CWOCN, HealyWCN-AP, CNS [phone]

## 2015-11-21 NOTE — Progress Notes (Signed)
Per MD gardner follow glucose stabilizer orders for insulin drip;

## 2015-11-21 NOTE — Progress Notes (Signed)
Initial Nutrition Assessment  DOCUMENTATION CODES:   Severe malnutrition in context of chronic illness  INTERVENTION:  Ensure Enlive BID. Each supplement provides 350 kcals and 20 grams of protein.    NUTRITION DIAGNOSIS:   Malnutrition related to wound healing, chronic illness as evidenced by severe depletion of body fat, severe depletion of muscle mass.  GOAL:   Patient will meet greater than or equal to 90% of their needs  MONITOR:   PO intake, Supplement acceptance, Labs, Weight trends, Skin, I & O's  REASON FOR ASSESSMENT:   Consult Wound healing  ASSESSMENT:   Pt with medical history significant of dementia, currently lives in SNF, brittle DM1, frequent admits for DKA, almost always has AMS compared to baseline when admitted to hospital for DKA (actually this will be my 3rd time admitting her and I do not believe I have ever seen her at "baseline"). Today patient isnt oriented to much of anything, although she is awake, eye opening spontaneous, and will track me around the room.  Pt was uncomfortable at time of visit. Attempted to obtain hx but pt was not able to communicate.   Per chart, Weight hx: 06/21/15  90 lb 2.7 oz (40.9 kg)  05/20/15  97 lb 14.2 oz (44.4 kg)  04/29/15  106 lb 9.6 oz (48.353 kg) Pt experienced a 15% weight loss in 2 months but no current weight in chart. This weight loss was significant for time frame.  NFPE: severe muscle and fat depletion, no edema. Pt meets criteria for severe malnutrition. Will order Ensure TID.  Labs reviewed; CBGs 94-201, Na 146, K 3.2, Cl 116. Meds reviewed; ferrous sulfate, D5 /NS @ 75 ml/hr (306 kcals)  Diet Order:  Diet NPO time specified  Skin:  Wound (see comment) (Stg II sacrum & buttocks )  Last BM:  unknown  Height:   Ht Readings from Last 1 Encounters:  06/21/15 5\' 2"  (1.575 m)    Weight:   Wt Readings from Last 1 Encounters:  06/21/15 90 lb 2.7 oz (40.9 kg)    Ideal Body Weight:     BMI:   There is no weight on file to calculate BMI.  Estimated Nutritional Needs:   Kcal:  1400-1600 kcals   Protein:  70-80 g  Fluid:  1.4-1.6 L  EDUCATION NEEDS:   No education needs identified at this time  Beryle QuantMeredith Devere Brem, MS [facility] Dietetic Intern Pager [phone]

## 2015-11-22 ENCOUNTER — Encounter (HOSPITAL_COMMUNITY): Payer: Self-pay | Admitting: Internal Medicine

## 2015-11-22 DIAGNOSIS — L8991 Pressure ulcer of unspecified site, stage 1: Secondary | ICD-10-CM | POA: Diagnosis present

## 2015-11-22 DIAGNOSIS — G9340 Encephalopathy, unspecified: Secondary | ICD-10-CM

## 2015-11-22 DIAGNOSIS — E43 Unspecified severe protein-calorie malnutrition: Secondary | ICD-10-CM

## 2015-11-22 DIAGNOSIS — E785 Hyperlipidemia, unspecified: Secondary | ICD-10-CM

## 2015-11-22 DIAGNOSIS — N179 Acute kidney failure, unspecified: Secondary | ICD-10-CM

## 2015-11-22 DIAGNOSIS — E108 Type 1 diabetes mellitus with unspecified complications: Secondary | ICD-10-CM

## 2015-11-22 DIAGNOSIS — Z22322 Carrier or suspected carrier of Methicillin resistant Staphylococcus aureus: Secondary | ICD-10-CM

## 2015-11-22 DIAGNOSIS — E1010 Type 1 diabetes mellitus with ketoacidosis without coma: Principal | ICD-10-CM

## 2015-11-22 DIAGNOSIS — F0391 Unspecified dementia with behavioral disturbance: Secondary | ICD-10-CM

## 2015-11-22 DIAGNOSIS — R1084 Generalized abdominal pain: Secondary | ICD-10-CM

## 2015-11-22 DIAGNOSIS — E876 Hypokalemia: Secondary | ICD-10-CM

## 2015-11-22 DIAGNOSIS — L8992 Pressure ulcer of unspecified site, stage 2: Secondary | ICD-10-CM

## 2015-11-22 LAB — GLUCOSE, CAPILLARY
GLUCOSE-CAPILLARY: 71 mg/dL (ref 65–99)
GLUCOSE-CAPILLARY: 69 mg/dL (ref 65–99)
Glucose-Capillary: 228 mg/dL — ABNORMAL HIGH (ref 65–99)
Glucose-Capillary: 191 mg/dL — ABNORMAL HIGH (ref 65–99)

## 2015-11-22 LAB — BASIC METABOLIC PANEL
Anion gap: 9 (ref 5–15)
CHLORIDE: 107 mmol/L (ref 101–111)
CO2: 26 mmol/L (ref 22–32)
Calcium: 10 mg/dL (ref 8.9–10.3)
Creatinine, Ser: 0.64 mg/dL (ref 0.44–1.00)
GFR calc non Af Amer: >60 mL/min (ref >60)
Glucose, Bld: 195 mg/dL — ABNORMAL HIGH (ref 65–99)
POTASSIUM: 2.8 mmol/L — AB (ref 3.5–5.1)
SODIUM: 142 mmol/L (ref 135–145)

## 2015-11-22 LAB — MRSA PCR SCREENING: MRSA BY PCR: POSITIVE — AB

## 2015-11-22 LAB — URINE CULTURE: CULTURE: NO GROWTH

## 2015-11-22 LAB — MAGNESIUM: Magnesium: 1.7 mg/dL (ref 1.7–2.4)

## 2015-11-22 MED ORDER — SODIUM CHLORIDE 0.9 % IV SOLN
INTRAVENOUS | Status: DC
  Administered 2015-11-22: 18:00:00 via INTRAVENOUS

## 2015-11-22 MED ORDER — MUPIROCIN 2 % EX OINT
1.0000 "application " | TOPICAL_OINTMENT | Freq: Two times a day (BID) | CUTANEOUS | Status: DC
  Administered 2015-11-22 – 2015-11-23 (×4): 1 via NASAL
  Filled 2015-11-22: qty 22

## 2015-11-22 MED ORDER — METOCLOPRAMIDE HCL 5 MG PO TABS
5.0000 mg | ORAL_TABLET | Freq: Three times a day (TID) | ORAL | Status: DC
  Administered 2015-11-22 – 2015-11-24 (×6): 5 mg via ORAL
  Filled 2015-11-22 (×6): qty 1

## 2015-11-22 MED ORDER — ACETAMINOPHEN 325 MG PO TABS
650.0000 mg | ORAL_TABLET | ORAL | Status: DC | PRN
  Administered 2015-11-22 – 2015-11-23 (×2): 650 mg via ORAL
  Filled 2015-11-22 (×2): qty 2

## 2015-11-22 MED ORDER — ENOXAPARIN SODIUM 40 MG/0.4ML ~~LOC~~ SOLN
40.0000 mg | SUBCUTANEOUS | Status: DC
  Administered 2015-11-22 – 2015-11-23 (×2): 40 mg via SUBCUTANEOUS
  Filled 2015-11-22 (×2): qty 0.4

## 2015-11-22 MED ORDER — POTASSIUM CHLORIDE 10 MEQ/100ML IV SOLN
10.0000 meq | INTRAVENOUS | Status: AC
  Administered 2015-11-22 (×6): 10 meq via INTRAVENOUS
  Filled 2015-11-22 (×6): qty 100

## 2015-11-22 MED ORDER — POLYETHYLENE GLYCOL 3350 17 G PO PACK
17.0000 g | PACK | Freq: Every day | ORAL | Status: DC
  Filled 2015-11-22: qty 1

## 2015-11-22 MED ORDER — BISACODYL 10 MG RE SUPP
10.0000 mg | Freq: Once | RECTAL | Status: AC
Start: 2015-11-22 — End: 2015-11-22
  Administered 2015-11-22: 10 mg via RECTAL

## 2015-11-22 MED ORDER — CHLORHEXIDINE GLUCONATE CLOTH 2 % EX PADS
6.0000 | MEDICATED_PAD | Freq: Every day | CUTANEOUS | Status: DC
  Administered 2015-11-22 – 2015-11-24 (×3): 6 via TOPICAL

## 2015-11-22 NOTE — Progress Notes (Deleted)
SLP Cancellation Note  Patient Details Name: Theron Aristahomasine C Smiles MRN: [id number] DOB: 03/24/1941   Cancelled treatment:       Reason Eval/Treat Not Completed: Pain limiting ability to participate. Cognitive linguist eval deferred. Pt is in pain, but demonstrating baseline speech and language deficits worsened by pain and profound generalized weakness. Pt is able to verbalize wants and needs, understand family on the phone and complex language. Dysarthria and ability to participate in basic functional tasks appears primarily impacted by pain and weakness. Do not suspect new neuro deficits. See dysphagia tx note.    Nylen Creque, Riley NearingBonnie Caroline 11/22/2015, 8:09 AM

## 2015-11-22 NOTE — Progress Notes (Signed)
Speech Language Pathology Treatment: Dysphagia  Patient Details Name: Beth Payne MRN: [id number] DOB: 12/05/1940 Today's Date: 11/22/2015 Time: [id number] SLP Time Calculation (min) (ACUTE ONLY): 22 min  Assessment / Plan / Recommendation Clinical Impression  Pt seen prior to am meal, awake but minimally responsive, only able to verbalize that her mouth was dry. SLP provided assist for pt to complete oral care. Pt was able to consume small sips of thin liquids with max verbal and tactile cueing for lips to cup, labial seal on straw, initiation of sip and rapid swallow trigger to reduce any oral holding.  Attempted upgraded solids, but prolonged mastication indicates ongoing need for pureed solids.  Pt was observed to have throat clearing before any oral care or PO was administered and intermittent throat clearing throughout session, not appearing to be directly related to PO intake. Recommend pt continue dys1 diet and thin liquids with max assist with PO. Will follow for upgrade as mentation improves.    HPI HPI: Pt is a 75 y.o. female with PMH of dementia, GERD, currently lives in SNF, brittle DM1, frequent admits for DKA, almost always has AMS compared to baseline when admitted to hospital for DKA, admitted with acute encephalopathy on chronic end stage dementia, fever, hyperglycemia. Reportedly minimally verbal upon admission on 6/15. CXR was clear. BSE ordered to determine safest diet given current AMS.       SLP Plan  Continue with current plan of care     Recommendations  Diet recommendations: Dysphagia 1 (puree);Thin liquid Liquids provided via: Cup;Straw Medication Administration: Crushed with puree Supervision: Staff to assist with self feeding;Full supervision/cueing for compensatory strategies Compensations: Slow rate;Small sips/bites;Minimize environmental distractions;Monitor for anterior loss Postural Changes and/or Swallow Maneuvers: Seated upright 90 degrees;Upright 30-60  min after meal             Oral Care Recommendations: Oral care BID Follow up Recommendations: Skilled Nursing facility Plan: Continue with current plan of care     GO                Vonna Brabson, Riley NearingBonnie Caroline 11/22/2015, 8:52 AM

## 2015-11-22 NOTE — Progress Notes (Signed)
Progress Note    AKEMI OVERHOLSER  UEA:[id number] DOB: 04/17/1941  DOA: 11/20/2015 PCP: [name], MD    Brief Narrative:   NEAVEH BELANGER is an 75 y.o. 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  Assessment/Plan:   Principal Problem:  DKA (diabetic ketoacidoses) (HCC) Transitioned to basal/bolus insulin and diet advanced 11/21/15. CBGs 133-228 on insulin sensitive SSI with 10 units of Lantus daily.  Active Problems:   Hypertension Continue Norvasc and prazosin.    Hypokalemia We'll give 6 runs of IV potassium. Check magnesium.   Acute encephalopathy with underlying history of severe dementia  Monitor mental status closely. Continue Abilify and Aricept.   AKI (acute kidney injury) (HCC)With severe dehydration, ketones Resolved with IV fluids.   Stage II pressure injury to sacrum, present on admission Optimize nutrition. Provide air mattress to reduce pressure. Provide foam dressing to protect and promote healing.   Abdominal pain / constipation KUB in ED showed prominent stool in the rectum may indicate impaction now bowel obstruction. Continue bowel regimen: Daily MiraLAX and Senokot. Dulcolax suppositories as needed.    Protein-calorie malnutrition, severe Evaluated by dietitian. Continue nutritional supplements.    Dyslipidemia Continue statin.    MRSA carrier Contact precautions, decontamination therapy ordered.    Family Communication/Anticipated D/C date and plan/Code Status   DVT prophylaxis: Lovenox. Code Status: Full Code.  Family Communication: No family at the bedside. Patient has a legal guardian. Disposition Plan: SNF when DKA resolved and patient stable, likely another 24-48 hours.   Medical Consultants:    None.   Procedures:    Anti-Infectives:   Anti-infectives    None      Subjective:    Flor C Eland tells me she has abdominal pain and bloating, and back pain.  No chest pain or dyspnea.  Bowels moved "a little" yesterday.  RN says she refused MiraLAX.  Objective:    Filed Vitals:   11/21/15 2046 11/22/15 0549 11/22/15 0930 11/22/15 1434  BP: 160/72 126/61 132/62 163/59  Pulse: 84 74  76  Temp: 98.1 F (36.7 C) 98.3 F (36.8 C)  98.1 F (36.7 C)  TempSrc: Oral Oral  Oral  Resp: 20 20  18   SpO2: 99% 100%  100%    Intake/Output Summary (Last 24 hours) at 11/22/15 1626 Last data filed at 11/21/15 1800  Gross per 24 hour  Intake    120 ml  Output      0 ml  Net    120 ml   There were no vitals filed for this visit.  Exam: General exam: Appears calm and comfortable.  Respiratory system: Clear to auscultation. Respiratory effort normal. Cardiovascular system: S1 & S2 heard, RRR. No JVD,  rubs, gallops or clicks. 2/6 systolic ejection murmur. Gastrointestinal system: Abdomen slightly distended, soft and nontender. No organomegaly or masses felt. Normal bowel sounds heard. Central nervous system: Alert and oriented to self only. No focal neurological deficits. Extremities: No clubbing, edema, or cyanosis. Skin: No rashes, lesions or ulcers Psychiatry: Judgement and insight appear impaired. Mood & affect appropriate.   Data Reviewed:   I have personally reviewed following labs and imaging studies:  Labs: Basic Metabolic Panel:  Recent Labs Lab 11/21/15 0404 11/21/15 0806 11/21/15 1111 11/21/15 1512 11/22/15 0300 11/22/15 1033  NA 148* 146* 145 143 142  --   K 3.2* 3.2* 2.7* 2.9* 2.8*  --  CL 115* 116* 113* 113* 107  --   CO2 20* 22 21* 23 26  --   GLUCOSE 200* 151* 250* 178* 195*  --   BUN 11 9 8 6  <5*  --   CREATININE 1.07* 0.86 1.04* 0.80 0.64  --   CALCIUM 10.5* 9.9 10.2 9.7 10.0  --   MG  --   --  1.8  --   --  1.7   GFR CrCl cannot be calculated (Unknown ideal weight.). Liver Function Tests:  Recent Labs Lab 11/20/15 1835  11/21/15 0217  AST 58* 49*  ALT 99* 82*  ALKPHOS 88 82  BILITOT 1.4* 1.1  PROT 7.6 6.7  ALBUMIN 4.6 4.0   CBC:  Recent Labs Lab 11/20/15 1853 11/20/15 1955  WBC  --  8.9  NEUTROABS  --  7.3  HGB 12.6 11.2*  HCT 37.0 34.3*  MCV  --  83.5  PLT  --  216   CBG:  Recent Labs Lab 11/21/15 1746 11/21/15 1853 11/21/15 2101 11/22/15 0642 11/22/15 1129  GLUCAP 171* 211* 133* 228* 191*   Sepsis Labs:  Recent Labs Lab 11/20/15 1854 11/20/15 1955  WBC  --  8.9  LATICACIDVEN 1.53  --    Microbiology Recent Results (from the past 240 hour(s))  Blood Culture (routine x 2)     Status: None (Preliminary result)   Collection Time: 11/20/15  6:35 PM  Result Value Ref Range Status   Specimen Description BLOOD RIGHT ANTECUBITAL  Final   Special Requests BOTTLES DRAWN AEROBIC AND ANAEROBIC 5CC  Final   Culture NO GROWTH < 24 HOURS  Final   Report Status PENDING  Incomplete  Blood Culture (routine x 2)     Status: None (Preliminary result)   Collection Time: 11/20/15  6:40 PM  Result Value Ref Range Status   Specimen Description BLOOD RIGHT FOREARM  Final   Special Requests BOTTLES DRAWN AEROBIC AND ANAEROBIC 3CC  Final   Culture NO GROWTH < 24 HOURS  Final   Report Status PENDING  Incomplete  Urine culture     Status: None   Collection Time: 11/20/15  8:11 PM  Result Value Ref Range Status   Specimen Description URINE, RANDOM  Final   Special Requests NONE  Final   Culture NO GROWTH  Final   Report Status 11/22/2015 FINAL  Final  MRSA PCR Screening     Status: Abnormal   Collection Time: 11/22/15 12:36 AM  Result Value Ref Range Status   MRSA by PCR POSITIVE (A) NEGATIVE Final    Comment:        The GeneXpert MRSA Assay (FDA approved for NASAL specimens only), is one component of a comprehensive MRSA colonization surveillance program. It is not intended to diagnose MRSA infection nor to guide or monitor treatment for MRSA infections. RESULT CALLED TO, READ BACK  BY AND VERIFIED WITH: BREA JOHNSON,RN @0405  [facility]/22/15 [facility]     Radiology: Dg Chest 2 View  11/20/2015  CLINICAL DATA:  Sepsis EXAM: CHEST  2 VIEW COMPARISON:  06/20/2015 FINDINGS: The heart size and mediastinal contours are within normal limits. Both lungs are clear. Lingular scarring is again noted. The visualized skeletal structures show changes of prior vertebral augmentation. IMPRESSION: No active cardiopulmonary disease. Electronically Signed   By: Alcide CleverMark  Lukens M.D.   On: 11/20/2015 19:26   Dg Abd 1 View  11/20/2015  CLINICAL DATA:  Abdominal pain. EXAM: ABDOMEN - 1 VIEW COMPARISON:  CT chest abdomen and  pelvis 06/20/2015 FINDINGS: Prominent stool-filled rectum. Remainder the colon is mostly decompressed with scattered gas and stool present. No small or large bowel distention. No radiopaque stones. Vascular calcifications. Degenerative changes in the spine and hips. IMPRESSION: Prominent stool in the rectum may indicate impaction. No evidence of bowel obstruction. Electronically Signed   By: Burman Nieves M.D.   On: 11/20/2015 23:56    Medications:   . amLODipine  10 mg Oral Daily  . ARIPiprazole  5 mg Oral q morning - 10a  . bisacodyl  10 mg Rectal Once  . Chlorhexidine Gluconate Cloth  6 each Topical Q0600  . donepezil  10 mg Oral QHS  . enoxaparin (LOVENOX) injection  40 mg Subcutaneous Q24H  . feeding supplement (ENSURE ENLIVE)  237 mL Oral TID WC  . feeding supplement (PRO-STAT SUGAR FREE 64)  30 mL Oral BID WC  . ferrous sulfate  325 mg Oral 2 times per day  . insulin aspart  0-5 Units Subcutaneous QHS  . insulin aspart  0-9 Units Subcutaneous TID WC  . insulin glargine  10 Units Subcutaneous Daily  . lamoTRIgine  50 mg Oral QHS  . LORazepam  0.5 mg Oral QHS  . metoCLOPramide  5 mg Oral TID AC  . mupirocin ointment  1 application Nasal BID  . pantoprazole  40 mg Oral BID  . polyethylene glycol  17 g Oral Daily  . potassium chloride  10 mEq Intravenous Q1 Hr x 6  .  prazosin  1 mg Oral QHS  . rosuvastatin  5 mg Oral q1800  . senna-docusate  2 tablet Oral BID  . sertraline  50 mg Oral TID   Continuous Infusions: . dextrose 5 % and 0.45% NaCl 25 mL/hr at 11/22/15 1308    Time spent: 35 minutes.  The patient is medically complex with multiple co-morbidities and is at high risk for clinical deterioration and requires high complexity decision making.    LOS: 2 days   Lakeena Downie  [facility] Pager [phone]. If unable to reach me by pager, please call my cell phone at [phone].  *Please refer to [email or web address], password [email or web address] to get updated schedule on who will round on this patient, as hospitalists switch teams weekly. If 7PM-7AM, please contact night-coverage at [email or web address], password [email or web address] for any overnight needs.  11/22/2015, 4:26 PM

## 2015-11-23 LAB — BASIC METABOLIC PANEL
Anion gap: 6 (ref 5–15)
BUN: 5 mg/dL — AB (ref 6–20)
CALCIUM: 9.8 mg/dL (ref 8.9–10.3)
CO2: 26 mmol/L (ref 22–32)
Chloride: 105 mmol/L (ref 101–111)
Creatinine, Ser: 0.65 mg/dL (ref 0.44–1.00)
GFR calc Af Amer: >60 mL/min (ref >60)
GLUCOSE: 108 mg/dL — AB (ref 65–99)
Potassium: 2.6 mmol/L — CL (ref 3.5–5.1)
Sodium: 137 mmol/L (ref 135–145)

## 2015-11-23 LAB — GLUCOSE, CAPILLARY
GLUCOSE-CAPILLARY: 300 mg/dL — AB (ref 65–99)
Glucose-Capillary: 126 mg/dL — ABNORMAL HIGH (ref 65–99)
Glucose-Capillary: 171 mg/dL — ABNORMAL HIGH (ref 65–99)
Glucose-Capillary: 181 mg/dL — ABNORMAL HIGH (ref 65–99)

## 2015-11-23 MED ORDER — POTASSIUM CHLORIDE 10 MEQ/100ML IV SOLN: 10.0000 meq | INTRAVENOUS | Status: DC

## 2015-11-23 MED ORDER — POTASSIUM CHLORIDE IN NACL 40-0.9 MEQ/L-% IV SOLN
INTRAVENOUS | Status: DC
  Administered 2015-11-23 – 2015-11-24 (×3): 100 mL/h via INTRAVENOUS
  Filled 2015-11-23 (×5): qty 1000

## 2015-11-23 MED ORDER — INSULIN GLARGINE 100 UNIT/ML ~~LOC~~ SOLN
8.0000 [IU] | Freq: Every day | SUBCUTANEOUS | Status: DC
  Administered 2015-11-23 – 2015-11-24 (×2): 8 [IU] via SUBCUTANEOUS
  Filled 2015-11-23 (×2): qty 0.08

## 2015-11-23 MED ORDER — POTASSIUM CHLORIDE 10 MEQ/100ML IV SOLN
10.0000 meq | INTRAVENOUS | Status: AC
  Administered 2015-11-23 (×3): 10 meq via INTRAVENOUS
  Filled 2015-11-23 (×4): qty 100

## 2015-11-23 MED ORDER — POTASSIUM CHLORIDE 10 MEQ/100ML IV SOLN
10.0000 meq | INTRAVENOUS | Status: AC
  Administered 2015-11-23 (×4): 10 meq via INTRAVENOUS
  Filled 2015-11-23 (×4): qty 100

## 2015-11-23 NOTE — Evaluation (Addendum)
Physical Therapy Evaluation Patient Details Name: Beth Payne MRN: [id number] DOB: 03/13/1941 Today's Date: 11/23/2015   History of Present Illness  75 y.o. 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    Clinical Impression  Pt admitted with above diagnosis. Patient initially not interested in therapy today "come back tomorrow," however as described role of PT she gladly agreed (by end of session she was smiling and thanking therapist). Patient has had prolonged decline in functional status since prior ankle fracture (per her guardian). [name] is unsure what insurance benefits she has for therapy, however patient is able to participate and can benefit from PT. Pt currently with functional limitations due to the deficits listed below (see PT Problem List).  Pt will benefit from skilled PT to increase their independence and safety with mobility to allow discharge to the venue listed below.       Follow Up Recommendations SNF    Equipment Recommendations  Other (comment) (TBA if does not go to SNF)    Recommendations for Other Services       Precautions / Restrictions Precautions Precautions: Fall      Mobility  Bed Mobility Overal bed mobility: Needs Assistance Bed Mobility: Rolling;Sidelying to Sit Rolling: Min assist Sidelying to sit: Min assist       General bed mobility comments: HOB flat, +rail; assist to fully roll and allow linen change (incontinent on arrival--aware and admitted she had not told anyone)  Transfers Overall transfer level: Needs assistance Equipment used: None (pt holding onto PTs forearms once standing) Transfers: Sit to/from Stand;Lateral/Scoot Transfers Sit to Stand: Mod assist        Lateral/Scoot Transfers: Min assist;Mod assist General transfer comment: pt initiated standing with pushing from bed with UEs; achieved 1/2 standing with  min assist and required mod assist to fully stand (hips, knees slightly flexed; head down); lateral scoot to HOB in sitting initially min assist and as fatigued required mod assist  Ambulation/Gait             General Gait Details: unable  Stairs            Wheelchair Mobility    Modified Rankin (Stroke Patients Only)       Balance Overall balance assessment: Needs assistance Sitting-balance support: No upper extremity supported;Feet supported Sitting balance-Leahy Scale: Fair Sitting balance - Comments: eating dinner at EOB with RN, family present    Standing balance support: Bilateral upper extremity supported Standing balance-Leahy Scale: Zero                               Pertinent Vitals/Pain Pain Assessment: Faces Faces Pain Scale: Hurts even more Pain Location: sacrum with rolling Rt, lt Pain Descriptors / Indicators: Grimacing Pain Intervention(s): Limited activity within patient's tolerance;Monitored during session;Repositioned    Home Living Family/patient expects to be discharged to:: Skilled nursing facility                 Additional Comments: per [name], legal guardian?, she has been in multiple SNF's over past 9 months    Prior Function Level of Independence: Needs assistance   Gait / Transfers Assistance Needed: pt reports she walks with assist and rollator limited distance; [name] denied this (where pt could not see him); she reported [name] can put her in his car and go out to eat  or to movies ([name] shook his head "no")  ADL's / Homemaking Assistance Needed: pt reports staff assists with all bathing and dressing        Hand Dominance   Dominant Hand: Right    Extremity/Trunk Assessment   Upper Extremity Assessment: Generalized weakness           Lower Extremity Assessment: RLE deficits/detail;Generalized weakness;LLE deficits/detail RLE Deficits / Details: AROM wfl except ankle 10 degree PF contracture; strength 3+ LLE  Deficits / Details: AROM wfl except ankle 10 degree PF contracture; strength 3+  Cervical / Trunk Assessment: Kyphotic  Communication   Communication: No difficulties  Cognition Arousal/Alertness: Awake/alert Behavior During Therapy:  (irritable initially; warmed up as sesssion cont'd) Overall Cognitive Status: History of cognitive impairments - at baseline (able to follow all instructions)       Memory: Decreased short-term memory              General Comments      Exercises        Assessment/Plan    PT Assessment Patient needs continued PT services  PT Diagnosis Generalized weakness;Altered mental status;Difficulty walking   PT Problem List Decreased strength;Decreased range of motion;Decreased balance;Decreased mobility;Decreased cognition;Decreased knowledge of use of DME;Decreased skin integrity;Pain  PT Treatment Interventions DME instruction;Functional mobility training;Therapeutic activities;Therapeutic exercise;Balance training;Cognitive remediation;Patient/family education   PT Goals (Current goals can be found in the Care Plan section) Acute Rehab PT Goals Patient Stated Goal: go to the movies PT Goal Formulation: With patient Time For Goal Achievement: 12/07/15 Potential to Achieve Goals: Good    Frequency Min 2X/week   Barriers to discharge Decreased caregiver support      Co-evaluation               End of Session Equipment Utilized During Treatment: Gait belt Activity Tolerance: Patient tolerated treatment well Patient left: in bed;with nursing/sitter in room;with family/visitor present (sitting EOB to eat) Nurse Communication: Mobility status         Time: 1610-9604 PT Time Calculation (min) (ACUTE ONLY): 41 min   Charges:   PT Evaluation $PT Eval Moderate Complexity: 1 Procedure PT Treatments $Therapeutic Activity: 23-37 mins   PT G Codes:        Cindee Mclester 12/12/15, 6:09 PM Pager [phone]

## 2015-11-23 NOTE — Progress Notes (Signed)
CRITICAL VALUE ALERT  Critical value received:  K: 2.6  Date of notification:  11/23/15  Time of notification:  03:17  Critical value read back: yes  Nurse who received alert:  Jinny Sandershohina Draco Malczewski, RN  MD notified (1st page):  Dr. Claiborne Billingsallahan  Time of first page: 03:19  MD notified (2nd page):  Time of second page:  Responding MD:  Dr. Claiborne Billingsallahan   Time MD responded:  03:21

## 2015-11-23 NOTE — Progress Notes (Signed)
PT Cancellation Note  Patient Details Name: Theron Aristahomasine C Lai MRN: [id number] DOB: 01/31/1941   Cancelled Treatment:    Reason Eval/Treat Not Completed: Other (comment)  Patient feeding herself breakfast and asked to continue her breakfast. As nutrition is also an issue, agreed to patient's request. Appears pt is a long-term resident of [facility] and ?non-ambulatory. Will follow-up later today   Jatoria Kneeland 11/23/2015, 9:38 AM  Pager [phone]

## 2015-11-23 NOTE — Progress Notes (Signed)
Progress Note    Beth Payne  EAV:[id number] DOB: [birth year]  DOA: 11/20/2015 PCP: [name], MD    Brief Narrative:   Beth Payne is an 75 y.o. 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  Assessment/Plan:   Principal Problem:  DKA (diabetic ketoacidoses) (HCC) Transitioned to basal/bolus insulin and diet advanced 11/21/15. CBGs 69-228 on insulin sensitive SSI with 10 units of Lantus daily. We'll reduce Lantus to 8 units.  Active Problems:   Hypertension Continue Norvasc and prazosin.    Hypokalemia We'll give 6 runs of IV potassium. Check magnesium.   Acute encephalopathy with underlying history of severe dementia  Monitor mental status closely. Continue Abilify and Aricept.   AKI (acute kidney injury) (HCC)With severe dehydration, ketones Resolved with IV fluids.   Stage II pressure injury to sacrum, present on admission Optimize nutrition. Provide air mattress to reduce pressure. Provide foam dressing to protect and promote healing.   Abdominal pain / constipation KUB in ED showed prominent stool in the rectum may indicate impaction now bowel obstruction. Continue bowel regimen: Daily MiraLAX and Senokot. Dulcolax suppositories as needed. 4 stools documented over the past 24 hours.    Protein-calorie malnutrition, severe Evaluated by dietitian. Continue nutritional supplements.    Dyslipidemia Continue statin.    MRSA carrier Contact precautions, decontamination therapy ordered.    Family Communication/Anticipated D/C date and plan/Code Status   DVT prophylaxis: Lovenox. Code Status: Full Code.  Family Communication: No family at the bedside. Patient has a legal guardian. Disposition Plan: SNF when potassium normalized, possibly 11/24/15.   Medical Consultants:    None.   Procedures:    Anti-Infectives:    Anti-infectives    None      Subjective:   Beth Payne tells me she doesn't feel well, but cannot specify how. Specifically denies chest pain, shortness of breath, nausea. Seems weak.  Objective:    Filed Vitals:   11/22/15 0930 11/22/15 1434 11/22/15 2059 11/23/15 0645  BP: 132/62 163/59 143/63 142/55  Pulse:  76 72 64  Temp:  98.1 F (36.7 C) 98.1 F (36.7 C) 97.3 F (36.3 C)  TempSrc:  Oral Oral Oral  Resp:  SpO2:  100% 100% 99%    Intake/Output Summary (Last 24 hours) at 11/23/15 0847 Last data filed at 11/22/15 1806  Gross per 24 hour  Intake    600 ml  Output      0 ml  Net    600 ml   There were no vitals filed for this visit.  Exam: General exam: Weak, lying in bed in fetal position.  Respiratory system: Clear to auscultation. Respiratory effort normal. Cardiovascular system: S1 & S2 heard, RRR. No JVD,  rubs, gallops or clicks. 2/6 systolic ejection murmur. Gastrointestinal system: Abdomen slightly distended, soft and nontender. No organomegaly or masses felt. Normal bowel sounds heard. Central nervous system: Sleepy, oriented to self only. No focal neurological deficits. Extremities: No clubbing, edema, or cyanosis. Skin: No rashes, lesions or ulcers Psychiatry: Judgement and insight appear impaired. Mood & affect appropriate.   Data Reviewed:   I have personally reviewed following labs and imaging studies:  Labs: Basic Metabolic Panel:  Recent Labs Lab 11/21/15 0806 11/21/15 1111 11/21/15 1512 11/22/15 0300 11/22/15 1033 11/23/15 0226  NA 146* 145 143 142  --  137  K 3.2* 2.7* 2.9*  2.8*  --  2.6*  CL 116* 113* 113* 107  --  105  CO2 22 21* 23 26  --  26  GLUCOSE 151* 250* 178* 195*  --  108*  BUN 9 8 6  <5*  --  5*  CREATININE 0.86 1.04* 0.80 0.64  --  0.65  CALCIUM 9.9 10.2 9.7 10.0  --  9.8  MG  --  1.8  --   --  1.7  --    GFR CrCl cannot be calculated (Unknown ideal weight.). Liver Function Tests:  Recent  Labs Lab 11/20/15 1835 11/21/15 0217  AST 58* 49*  ALT 99* 82*  ALKPHOS 88 82  BILITOT 1.4* 1.1  PROT 7.6 6.7  ALBUMIN 4.6 4.0   CBC:  Recent Labs Lab 11/20/15 1853 11/20/15 1955  WBC  --  8.9  NEUTROABS  --  7.3  HGB 12.6 11.2*  HCT 37.0 34.3*  MCV  --  83.5  PLT  --  216   CBG:  Recent Labs Lab 11/22/15 0642 11/22/15 1129 11/22/15 1626 11/22/15 2040 11/23/15 0634  GLUCAP 228* 191* 71 69 126*   Sepsis Labs:  Recent Labs Lab 11/20/15 1854 11/20/15 1955  WBC  --  8.9  LATICACIDVEN 1.53  --    Microbiology Recent Results (from the past 240 hour(s))  Blood Culture (routine x 2)     Status: None (Preliminary result)   Collection Time: 11/20/15  6:35 PM  Result Value Ref Range Status   Specimen Description BLOOD RIGHT ANTECUBITAL  Final   Special Requests BOTTLES DRAWN AEROBIC AND ANAEROBIC 5CC  Final   Culture NO GROWTH 2 DAYS  Final   Report Status PENDING  Incomplete  Blood Culture (routine x 2)     Status: None (Preliminary result)   Collection Time: 11/20/15  6:40 PM  Result Value Ref Range Status   Specimen Description BLOOD RIGHT FOREARM  Final   Special Requests BOTTLES DRAWN AEROBIC AND ANAEROBIC 3CC  Final   Culture NO GROWTH 2 DAYS  Final   Report Status PENDING  Incomplete  Urine culture     Status: None   Collection Time: 11/20/15  8:11 PM  Result Value Ref Range Status   Specimen Description URINE, RANDOM  Final   Special Requests NONE  Final   Culture NO GROWTH  Final   Report Status 11/22/2015 FINAL  Final  MRSA PCR Screening     Status: Abnormal   Collection Time: 11/22/15 12:36 AM  Result Value Ref Range Status   MRSA by PCR POSITIVE (A) NEGATIVE Final    Comment:        The GeneXpert MRSA Assay (FDA approved for NASAL specimens only), is one component of a comprehensive MRSA colonization surveillance program. It is not intended to diagnose MRSA infection nor to guide or monitor treatment for MRSA infections. RESULT CALLED  TO, READ BACK BY AND VERIFIED WITH: BREA JOHNSON,RN @0405  11/22/15 [facility]     Radiology: No results found.  Medications:   . amLODipine  10 mg Oral Daily  . ARIPiprazole  5 mg Oral q morning - 10a  . bisacodyl  10 mg Rectal Once  . Chlorhexidine Gluconate Cloth  6 each Topical Q0600  . donepezil  10 mg Oral QHS  . enoxaparin (LOVENOX) injection  40 mg Subcutaneous Q24H  . feeding supplement (ENSURE ENLIVE)  237 mL Oral TID WC  . feeding supplement (PRO-STAT SUGAR FREE 64)  30 mL Oral BID WC  .  ferrous sulfate  325 mg Oral 2 times per day  . insulin aspart  0-5 Units Subcutaneous QHS  . insulin aspart  0-9 Units Subcutaneous TID WC  . insulin glargine  10 Units Subcutaneous Daily  . lamoTRIgine  50 mg Oral QHS  . LORazepam  0.5 mg Oral QHS  . metoCLOPramide  5 mg Oral TID AC  . mupirocin ointment  1 application Nasal BID  . pantoprazole  40 mg Oral BID  . polyethylene glycol  17 g Oral Daily  . prazosin  1 mg Oral QHS  . rosuvastatin  5 mg Oral q1800  . senna-docusate  2 tablet Oral BID  . sertraline  50 mg Oral TID   Continuous Infusions: . sodium chloride 50 mL/hr at 11/22/15 1808    Time spent: 25 minutes.    LOS: 3 days   Zienna Ahlin  [facility] Pager [phone]. If unable to reach me by pager, please call my cell phone at [phone].  *Please refer to [email or web address], password [email or web address] to get updated schedule on who will round on this patient, as hospitalists switch teams weekly. If 7PM-7AM, please contact night-coverage at [email or web address], password [email or web address] for any overnight needs.  11/23/2015, 8:47 AM

## 2015-11-23 NOTE — Clinical Social Work Note (Signed)
Clinical Social Work Assessment  Patient Details  Name: Beth Payne MRN: [id number] Date of Birth: 12/29/1940  Date of referral:  11/23/15               Reason for consult:  Facility Placement, Discharge Planning                Permission sought to share information with:  Guardian Permission granted to share information::  No (Patient unable to give consent)  Name::     Donna Bernardon Bruner  Agency::     Relationship::  Legal Guardian ([name] Loletha CarrowBruner states he is the patient's legal guardian.)  Contact Information:     Housing/Transportation Living arrangements for the past 2 months:  [facility] Source of Information:  Guardian Patient Interpreter Needed:  None Criminal Activity/Legal Involvement Pertinent to Current Situation/Hospitalization:  No - Comment as needed Significant Relationships:  Other Family Members, Friend Lives with:  Facility Resident Do you feel safe going back to the place where you live?  Yes Need for family participation in patient care:  Yes (Comment)  Care giving concerns:  The patient's guardian [name] reports no concerns at this time.    Social Worker assessment / plan:  CSW contact first listed contact in [place] to complete assessment. Donna BernardRon Bruner states that he is the legal guardian for the patient and plans for the patient to return to [facility] at time of discharge. CSW explained CSW role and process of getting the patient back to the facility when appropriate. CSW answered all of [name]'s questions.  Employment status:  Disabled (Comment on whether or not currently receiving Disability), Retired Health and safety inspectornsurance information:  Medicare PT Recommendations:  Skilled Nursing Facility Information / Referral to community resources:  Skilled Nursing Facility  Patient/Family's Response to care:  [name] seems happy with the care the patient is receiving.  Patient/Family's Understanding of and Emotional Response to Diagnosis, Current Treatment, and Prognosis:  [name]  seems to have a good understanding of the patient's diagnosis and the patient's post DC need for continue SNF care.  Emotional Assessment Appearance:  Appears stated age Attitude/Demeanor/Rapport:  Unable to Assess Affect (typically observed):  Unable to Assess Orientation:  Oriented to Self Alcohol / Substance use:  Not Applicable Psych involvement (Current and /or in the community):  No (Comment)  Discharge Needs  Concerns to be addressed:  Discharge Planning Concerns Readmission within the last 30 days:  No Current discharge risk:  Cognitively Impaired, Physical Impairment, Chronically ill Barriers to Discharge:  Continued Medical Work up   Venita Lickampbell, Leandrea Ackley B, LCSW 11/23/2015, 3:05 PM

## 2015-11-23 NOTE — NC FL2 (Addendum)
[place] MEDICAID FL2 LEVEL OF CARE SCREENING TOOL     IDENTIFICATION  Patient Name: Beth Payne Birthdate: [birth year] Sex: female Admission Date (Current Location): 11/20/2015  [facility] and [place] Number:  Producer, television/film/video and Address:  [facility] [place], [address] [place], [place] [address]      Provider Number: [id number]  Attending Physician Name and Address:  Maryruth Bun Rama, MD  Relative Name and Phone Number:       Current Level of Care: Hospital Recommended Level of Care: Skilled Nursing Facility Prior Approval Number:    Date Approved/Denied:   PASRR Number:   [id number]  Discharge Plan: Home    Current Diagnoses: Patient Active Problem List   Diagnosis Date Noted  . Protein-calorie malnutrition, severe 11/22/2015  . Pressure ulcer stage II, sacrum 11/22/2015  . 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 Dementia 11/20/2015  . Abdominal pain 11/20/2015  . Weakness generalized 05/20/2015  . Lung mass 05/20/2015  . Hypokalemia 04/24/2015  . AKI (acute kidney injury) (HCC) 02/23/2015  . Acute encephalopathy 01/01/2015  . DM (diabetes mellitus), type 1 with complications (HCC) 12/17/2014  . CAD in native artery   . Pulmonary hypertension (HCC)   . GERD (gastroesophageal reflux disease) 12/12/2014  . CAD s/p stent to mid-RCA in 2001. 12/12/2014  . Osteoarthritis 12/12/2014  . Systolic murmur 12/12/2014  . Normocytic anemia 12/12/2014  . Dyslipidemia 12/12/2014  . Diabetic ketoacidosis without coma associated with type 1 diabetes mellitus (HCC)     Orientation RESPIRATION BLADDER Height & Weight     Self  Normal Incontinent Weight:   Height:     BEHAVIORAL SYMPTOMS/MOOD NEUROLOGICAL BOWEL NUTRITION STATUS      Incontinent Diet (Dysphagia 1)  AMBULATORY STATUS COMMUNICATION OF NEEDS Skin   Limited Assist Verbally Other (Comment) (Stage 2 sacrum, Exoriated (scratch marks) L shoulder and  Left nee, Buttocks- moisture associated skin  damage)                       Personal Care Assistance Level of Assistance  Bathing, Feeding, Dressing Bathing Assistance: Limited assistance Feeding assistance: Limited assistance Dressing Assistance: Limited assistance     Functional Limitations Info             SPECIAL CARE FACTORS FREQUENCY  PT (By licensed PT), OT (By licensed OT)     PT Frequency: 5 OT Frequency: 5            Contractures Contractures Info: Not present    Additional Factors Info  Code Status, Allergies Code Status Info: DNR Allergies Info: Diclofenac Sodium, Doxycycline Calcium,Codeine, Zocor,, Calcium-containing compounds           Current Medications (11/23/2015):  This is the current hospital active medication list Current Facility-Administered Medications  Medication Dose Route Frequency Provider Last Rate Last Dose  . 0.9 % NaCl with KCl 40 mEq / L  infusion   Intravenous Continuous Maryruth Bun Rama, MD 100 mL/hr at 11/23/15 0938 100 mL/hr at 11/23/15 0938  . acetaminophen (TYLENOL) tablet 650 mg  650 mg Oral Q4H PRN Maryruth Bun Rama, MD   650 mg at 11/22/15 1631  . amLODipine (NORVASC) tablet 10 mg  10 mg Oral Daily Hillary Bow, DO   10 mg at 11/23/15 0936  . ARIPiprazole (ABILIFY) tablet 5 mg  5 mg Oral q morning - 10a Hillary Bow, DO   5 mg at 11/23/15 0936  . bisacodyl (DULCOLAX) EC  tablet 10 mg  10 mg Oral Daily PRN Hillary BowJared M Gardner, DO      . bisacodyl (DULCOLAX) suppository 10 mg  10 mg Rectal Once Ripudeep Jenna LuoK Rai, MD   10 mg at 11/21/15 1115  . Chlorhexidine Gluconate Cloth 2 % PADS 6 each  6 each Topical Q0600 Maryruth Bunhristina P Rama, MD   6 each at 11/23/15 0520  . donepezil (ARICEPT) tablet 10 mg  10 mg Oral QHS Hillary BowJared M Gardner, DO   10 mg at 11/22/15 2110  . enoxaparin (LOVENOX) injection 40 mg  40 mg Subcutaneous Q24H Maryruth Bunhristina P Rama, MD   40 mg at 11/23/15 1207  . feeding supplement (ENSURE ENLIVE) (ENSURE ENLIVE) liquid 237 mL  237 mL Oral TID WC Ripudeep K Rai, MD   237  mL at 11/22/15 0700  . feeding supplement (PRO-STAT SUGAR FREE 64) liquid 30 mL  30 mL Oral BID WC Ripudeep K Rai, MD   30 mL at 11/21/15 1645  . ferrous sulfate tablet 325 mg  325 mg Oral 2 times per day Hillary BowJared M Gardner, DO   325 mg at 11/23/15 [id number]  . insulin aspart (novoLOG) injection 0-5 Units  0-5 Units Subcutaneous QHS Ripudeep Jenna LuoK Rai, MD   0 Units at 11/21/15 2142  . insulin aspart (novoLOG) injection 0-9 Units  0-9 Units Subcutaneous TID WC Ripudeep Jenna LuoK Rai, MD   5 Units at 11/23/15 1207  . insulin glargine (LANTUS) injection 8 Units  8 Units Subcutaneous Daily Maryruth Bunhristina P Rama, MD   8 Units at 11/23/15 [phone]  . lamoTRIgine (LAMICTAL) tablet 50 mg  50 mg Oral QHS Hillary BowJared M Gardner, DO   50 mg at 11/22/15 2109  . LORazepam (ATIVAN) tablet 0.5 mg  0.5 mg Oral QHS Hillary BowJared M Gardner, DO   0.5 mg at 11/22/15 2110  . metoCLOPramide (REGLAN) tablet 5 mg  5 mg Oral TID AC Maryruth Bunhristina P Rama, MD   5 mg at 11/23/15 1052  . mupirocin ointment (BACTROBAN) 2 % 1 application  1 application Nasal BID Maryruth Bunhristina P Rama, MD   1 application at 11/23/15 [phone]  . pantoprazole (PROTONIX) EC tablet 40 mg  40 mg Oral BID Hillary BowJared M Gardner, DO   40 mg at 11/23/15 0936  . polyethylene glycol (MIRALAX / GLYCOLAX) packet 17 g  17 g Oral Daily Maryruth Bunhristina P Rama, MD   17 g at 11/22/15 1143  . potassium chloride 10 mEq in 100 mL IVPB  10 mEq Intravenous Q1 Hr x 4 Maryruth Bunhristina P Rama, MD   10 mEq at 11/23/15 1206  . prazosin (MINIPRESS) capsule 1 mg  1 mg Oral QHS Hillary BowJared M Gardner, DO   1 mg at 11/22/15 2110  . rosuvastatin (CRESTOR) tablet 5 mg  5 mg Oral q1800 Hillary BowJared M Gardner, DO   5 mg at 11/21/15 1800  . senna-docusate (Senokot-S) tablet 2 tablet  2 tablet Oral BID Hillary BowJared M Gardner, DO   2 tablet at 11/23/15 0936  . sertraline (ZOLOFT) tablet 50 mg  50 mg Oral TID Hillary BowJared M Gardner, DO   50 mg at 11/23/15 [id number]     Discharge Medications: Please see discharge summary for a list of discharge medications.  Relevant Imaging  Results:  Relevant Lab Results:   Additional Information SSN:  [id number]  [place], Lorri Frederickonna T, [place]

## 2015-11-24 DIAGNOSIS — K5909 Other constipation: Secondary | ICD-10-CM | POA: Diagnosis present

## 2015-11-24 DIAGNOSIS — E108 Type 1 diabetes mellitus with unspecified complications: Secondary | ICD-10-CM | POA: Diagnosis not present

## 2015-11-24 DIAGNOSIS — E1065 Type 1 diabetes mellitus with hyperglycemia: Secondary | ICD-10-CM | POA: Diagnosis not present

## 2015-11-24 DIAGNOSIS — F329 Major depressive disorder, single episode, unspecified: Secondary | ICD-10-CM | POA: Diagnosis present

## 2015-11-24 DIAGNOSIS — R627 Adult failure to thrive: Secondary | ICD-10-CM | POA: Diagnosis not present

## 2015-11-24 DIAGNOSIS — E039 Hypothyroidism, unspecified: Secondary | ICD-10-CM | POA: Diagnosis not present

## 2015-11-24 DIAGNOSIS — R1111 Vomiting without nausea: Secondary | ICD-10-CM | POA: Diagnosis not present

## 2015-11-24 DIAGNOSIS — E119 Type 2 diabetes mellitus without complications: Secondary | ICD-10-CM | POA: Diagnosis not present

## 2015-11-24 DIAGNOSIS — M545 Low back pain: Secondary | ICD-10-CM | POA: Diagnosis present

## 2015-11-24 DIAGNOSIS — I4510 Unspecified right bundle-branch block: Secondary | ICD-10-CM | POA: Diagnosis present

## 2015-11-24 DIAGNOSIS — L89152 Pressure ulcer of sacral region, stage 2: Secondary | ICD-10-CM | POA: Diagnosis present

## 2015-11-24 DIAGNOSIS — E785 Hyperlipidemia, unspecified: Secondary | ICD-10-CM | POA: Diagnosis present

## 2015-11-24 DIAGNOSIS — L309 Dermatitis, unspecified: Secondary | ICD-10-CM | POA: Diagnosis present

## 2015-11-24 DIAGNOSIS — Z885 Allergy status to narcotic agent status: Secondary | ICD-10-CM | POA: Diagnosis not present

## 2015-11-24 DIAGNOSIS — G4089 Other seizures: Secondary | ICD-10-CM | POA: Diagnosis not present

## 2015-11-24 DIAGNOSIS — E43 Unspecified severe protein-calorie malnutrition: Secondary | ICD-10-CM | POA: Diagnosis not present

## 2015-11-24 DIAGNOSIS — R7309 Other abnormal glucose: Secondary | ICD-10-CM | POA: Diagnosis not present

## 2015-11-24 DIAGNOSIS — Z881 Allergy status to other antibiotic agents status: Secondary | ICD-10-CM | POA: Diagnosis not present

## 2015-11-24 DIAGNOSIS — E559 Vitamin D deficiency, unspecified: Secondary | ICD-10-CM | POA: Diagnosis not present

## 2015-11-24 DIAGNOSIS — Z66 Do not resuscitate: Secondary | ICD-10-CM | POA: Diagnosis present

## 2015-11-24 DIAGNOSIS — D649 Anemia, unspecified: Secondary | ICD-10-CM | POA: Diagnosis not present

## 2015-11-24 DIAGNOSIS — I252 Old myocardial infarction: Secondary | ICD-10-CM | POA: Diagnosis not present

## 2015-11-24 DIAGNOSIS — K219 Gastro-esophageal reflux disease without esophagitis: Secondary | ICD-10-CM | POA: Diagnosis present

## 2015-11-24 DIAGNOSIS — R278 Other lack of coordination: Secondary | ICD-10-CM | POA: Diagnosis not present

## 2015-11-24 DIAGNOSIS — F411 Generalized anxiety disorder: Secondary | ICD-10-CM | POA: Diagnosis not present

## 2015-11-24 DIAGNOSIS — R2689 Other abnormalities of gait and mobility: Secondary | ICD-10-CM | POA: Diagnosis not present

## 2015-11-24 DIAGNOSIS — R569 Unspecified convulsions: Secondary | ICD-10-CM | POA: Diagnosis present

## 2015-11-24 DIAGNOSIS — F0391 Unspecified dementia with behavioral disturbance: Secondary | ICD-10-CM | POA: Diagnosis present

## 2015-11-24 DIAGNOSIS — R740 Nonspecific elevation of levels of transaminase and lactic acid dehydrogenase [LDH]: Secondary | ICD-10-CM | POA: Diagnosis present

## 2015-11-24 DIAGNOSIS — E1010 Type 1 diabetes mellitus with ketoacidosis without coma: Secondary | ICD-10-CM | POA: Diagnosis not present

## 2015-11-24 DIAGNOSIS — E10319 Type 1 diabetes mellitus with unspecified diabetic retinopathy without macular edema: Secondary | ICD-10-CM | POA: Diagnosis present

## 2015-11-24 DIAGNOSIS — Z9842 Cataract extraction status, left eye: Secondary | ICD-10-CM | POA: Diagnosis not present

## 2015-11-24 DIAGNOSIS — Z79899 Other long term (current) drug therapy: Secondary | ICD-10-CM | POA: Diagnosis not present

## 2015-11-24 DIAGNOSIS — Z955 Presence of coronary angioplasty implant and graft: Secondary | ICD-10-CM | POA: Diagnosis not present

## 2015-11-24 DIAGNOSIS — I25111 Atherosclerotic heart disease of native coronary artery with angina pectoris with documented spasm: Secondary | ICD-10-CM | POA: Diagnosis not present

## 2015-11-24 DIAGNOSIS — F0390 Unspecified dementia without behavioral disturbance: Secondary | ICD-10-CM | POA: Diagnosis not present

## 2015-11-24 DIAGNOSIS — F331 Major depressive disorder, recurrent, moderate: Secondary | ICD-10-CM | POA: Diagnosis not present

## 2015-11-24 DIAGNOSIS — Z800 Family history of malignant neoplasm of digestive organs: Secondary | ICD-10-CM | POA: Diagnosis not present

## 2015-11-24 DIAGNOSIS — Z87891 Personal history of nicotine dependence: Secondary | ICD-10-CM | POA: Diagnosis not present

## 2015-11-24 DIAGNOSIS — M797 Fibromyalgia: Secondary | ICD-10-CM | POA: Diagnosis present

## 2015-11-24 DIAGNOSIS — K3184 Gastroparesis: Secondary | ICD-10-CM | POA: Diagnosis not present

## 2015-11-24 DIAGNOSIS — E1310 Other specified diabetes mellitus with ketoacidosis without coma: Secondary | ICD-10-CM | POA: Diagnosis not present

## 2015-11-24 DIAGNOSIS — Z961 Presence of intraocular lens: Secondary | ICD-10-CM | POA: Diagnosis present

## 2015-11-24 DIAGNOSIS — E1043 Type 1 diabetes mellitus with diabetic autonomic (poly)neuropathy: Secondary | ICD-10-CM | POA: Diagnosis present

## 2015-11-24 DIAGNOSIS — N179 Acute kidney failure, unspecified: Secondary | ICD-10-CM | POA: Diagnosis not present

## 2015-11-24 DIAGNOSIS — E784 Other hyperlipidemia: Secondary | ICD-10-CM | POA: Diagnosis not present

## 2015-11-24 DIAGNOSIS — Z8673 Personal history of transient ischemic attack (TIA), and cerebral infarction without residual deficits: Secondary | ICD-10-CM | POA: Diagnosis not present

## 2015-11-24 DIAGNOSIS — E440 Moderate protein-calorie malnutrition: Secondary | ICD-10-CM | POA: Diagnosis not present

## 2015-11-24 DIAGNOSIS — M6281 Muscle weakness (generalized): Secondary | ICD-10-CM | POA: Diagnosis not present

## 2015-11-24 DIAGNOSIS — I10 Essential (primary) hypertension: Secondary | ICD-10-CM | POA: Diagnosis present

## 2015-11-24 DIAGNOSIS — Z90710 Acquired absence of both cervix and uterus: Secondary | ICD-10-CM | POA: Diagnosis not present

## 2015-11-24 DIAGNOSIS — I2510 Atherosclerotic heart disease of native coronary artery without angina pectoris: Secondary | ICD-10-CM | POA: Diagnosis not present

## 2015-11-24 DIAGNOSIS — R32 Unspecified urinary incontinence: Secondary | ICD-10-CM | POA: Diagnosis present

## 2015-11-24 DIAGNOSIS — Z794 Long term (current) use of insulin: Secondary | ICD-10-CM | POA: Diagnosis not present

## 2015-11-24 DIAGNOSIS — G9340 Encephalopathy, unspecified: Secondary | ICD-10-CM | POA: Diagnosis not present

## 2015-11-24 DIAGNOSIS — F419 Anxiety disorder, unspecified: Secondary | ICD-10-CM | POA: Diagnosis present

## 2015-11-24 DIAGNOSIS — G8929 Other chronic pain: Secondary | ICD-10-CM | POA: Diagnosis present

## 2015-11-24 DIAGNOSIS — Z888 Allergy status to other drugs, medicaments and biological substances status: Secondary | ICD-10-CM | POA: Diagnosis not present

## 2015-11-24 DIAGNOSIS — R1084 Generalized abdominal pain: Secondary | ICD-10-CM | POA: Diagnosis not present

## 2015-11-24 DIAGNOSIS — Z9841 Cataract extraction status, right eye: Secondary | ICD-10-CM | POA: Diagnosis not present

## 2015-11-24 DIAGNOSIS — E1165 Type 2 diabetes mellitus with hyperglycemia: Secondary | ICD-10-CM | POA: Diagnosis not present

## 2015-11-24 LAB — BASIC METABOLIC PANEL
Anion gap: 5 (ref 5–15)
BUN: 8 mg/dL (ref 6–20)
CALCIUM: 9.8 mg/dL (ref 8.9–10.3)
CO2: 23 mmol/L (ref 22–32)
CREATININE: 0.63 mg/dL (ref 0.44–1.00)
Chloride: 108 mmol/L (ref 101–111)
Glucose, Bld: 284 mg/dL — ABNORMAL HIGH (ref 65–99)
Potassium: 4.8 mmol/L (ref 3.5–5.1)
SODIUM: 136 mmol/L (ref 135–145)

## 2015-11-24 LAB — GLUCOSE, CAPILLARY
GLUCOSE-CAPILLARY: 233 mg/dL — AB (ref 65–99)
Glucose-Capillary: 219 mg/dL — ABNORMAL HIGH (ref 65–99)

## 2015-11-24 LAB — HEMOGLOBIN A1C
HEMOGLOBIN A1C: 10.3 % — AB (ref 4.8–5.6)
Mean Plasma Glucose: 249 mg/dL

## 2015-11-24 LAB — MAGNESIUM: MAGNESIUM: 1.4 mg/dL — AB (ref 1.7–2.4)

## 2015-11-24 MED ORDER — INSULIN ASPART 100 UNIT/ML ~~LOC~~ SOLN: 0.0000 [IU] | Freq: Three times a day (TID) | SUBCUTANEOUS | Status: DC

## 2015-11-24 MED ORDER — MAGNESIUM SULFATE 2 GM/50ML IV SOLN
2.0000 g | Freq: Once | INTRAVENOUS | Status: AC
  Administered 2015-11-24: 2 g via INTRAVENOUS
  Filled 2015-11-24: qty 50

## 2015-11-24 MED ORDER — TRAMADOL HCL 50 MG PO TABS
50.0000 mg | ORAL_TABLET | Freq: Four times a day (QID) | ORAL | Status: DC | PRN
Start: 2015-11-24 — End: 2015-12-15

## 2015-11-24 MED ORDER — METOCLOPRAMIDE HCL 5 MG PO TABS: 5.0000 mg | ORAL_TABLET | Freq: Three times a day (TID) | ORAL | Status: DC

## 2015-11-24 MED ORDER — INSULIN ASPART 100 UNIT/ML ~~LOC~~ SOLN: 0.0000 [IU] | Freq: Every day | SUBCUTANEOUS | Status: DC

## 2015-11-24 MED ORDER — LORAZEPAM 0.5 MG PO TABS: 0.5000 mg | ORAL_TABLET | Freq: Every day | ORAL | Status: DC

## 2015-11-24 MED ORDER — BISACODYL 10 MG RE SUPP: 10.0000 mg | RECTAL | Status: DC

## 2015-11-24 MED ORDER — INSULIN ASPART 100 UNIT/ML ~~LOC~~ SOLN: 3.0000 [IU] | Freq: Three times a day (TID) | SUBCUTANEOUS | Status: DC

## 2015-11-24 MED ORDER — ENSURE ENLIVE PO LIQD: 237.0000 mL | Freq: Three times a day (TID) | ORAL | Status: DC

## 2015-11-24 MED ORDER — PRO-STAT SUGAR FREE PO LIQD: 30.0000 mL | Freq: Two times a day (BID) | ORAL | Status: DC

## 2015-11-24 MED ORDER — INSULIN GLARGINE 100 UNIT/ML ~~LOC~~ SOLN: 8.0000 [IU] | Freq: Every day | SUBCUTANEOUS | Status: DC

## 2015-11-24 MED ORDER — POLYETHYLENE GLYCOL 3350 17 G PO PACK: 17.0000 g | PACK | Freq: Every day | ORAL | Status: AC

## 2015-11-24 MED ORDER — ENOXAPARIN SODIUM 30 MG/0.3ML ~~LOC~~ SOLN: 30.0000 mg | Freq: Every day | SUBCUTANEOUS | Status: DC

## 2015-11-24 NOTE — Progress Notes (Signed)
Patient will discharge to [facility] Anticipated discharge date: 6/19 Family notified: pt HCPOA ([name]) Transportation by [employer]- scheduled for 1pm  CSW signing off.  Merlyn LotJenna Holoman, LCSWA Clinical Social Worker [phone]

## 2015-11-24 NOTE — Discharge Summary (Signed)
Physician Discharge Summary  Beth Aristahomasine C Ackerman ZOX:[id number] DOB: 03/08/1941 DOA: 11/20/2015  PCP: Julian HySOUTH,STEPHEN ALAN, MD  Admit date: 11/20/2015 Discharge date: 11/24/2015   Recommendations for Outpatient Follow-Up:   1. Please monitor the patient's blood glucoses closely.  Her insulin regimen has been changed.  Keep a record of her blood glucoses, and send this record with her when she sees her PCP.  She needs F/U in 1 week to re-assess glycemic control.   Discharge Diagnosis:   Principal Problem:    Diabetic ketoacidosis without coma associated with type 1 diabetes mellitus (HCC) Active Problems:    Dyslipidemia    DM (diabetes mellitus), type 1 with complications (HCC)    Acute encephalopathy    AKI (acute kidney injury) (HCC)    Hypokalemia    Dementia    Abdominal pain    Protein-calorie malnutrition, severe    Pressure ulcer stage II, sacrum    MRSA carrier    Hypomagnesemia   Discharge disposition:  SNF  Discharge Condition: Improved.  Diet recommendation: Low sodium, heart healthy.  Carbohydrate-modified.    Wound care: Foam dressing to protect and promote healing to stage II sacral pressure injury.   History of Present Illness:   Beth Payne is an [age] y.o. 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  Hospital Course by Problem:   Principal Problem:  DKA (diabetic ketoacidoses) (HCC) Transitioned to basal/bolus insulin and diet advanced 11/21/15. CBGs 126-300 on insulin sensitive SSI with 8 units of Lantus daily. D/C home on 8 units of Lantus daily, plus insulin sensitive SSI Q AC/HS with 3 units of meal coverage.  Recommend close F/U with Dr. Evlyn [place].  Active Problems:  Hypertension Continue Norvasc and prazosin.   Hypokalemia/hypomagnesemia Potassium and magnesium replaced.    Acute encephalopathy with underlying history  of severe dementia  Continue Abilify and Aricept. Mental status at usual baseline.   AKI (acute kidney injury) (HCC)With severe dehydration, ketones Resolved with IV fluids.   Stage II pressure injury to sacrum, present on admission Optimize nutrition. Provide air mattress to reduce pressure. Provide foam dressing to protect and promote healing.   Abdominal pain / constipation KUB in ED showed prominent stool in the rectum may indicate impaction now bowel obstruction. Continue bowel regimen: Daily MiraLAX and Senokot. Dulcolax suppositories every other day (hold if bowels move in the preceding 24 hours). Bowels now moving.   Protein-calorie malnutrition, severe Evaluated by dietitian. Continue nutritional supplements.   Dyslipidemia Continue statin.  MRSA carrier Contact precautions, decontamination therapy provided.    Medical Consultants:    None.   Discharge Exam:   Filed Vitals:   11/23/15 1955 11/24/15 0454  BP: 125/48 135/55  Pulse: 69 65  Temp: 98 F (36.7 C) 97.9 F (36.6 C)  Resp: 17    Filed Vitals:   11/23/15 0936 11/23/15 1412 11/23/15 1955 11/24/15 0454  BP: 138/60 134/48 125/48 135/55  Pulse:  65 69 65  Temp:  98.1 F (36.7 C) 98 F (36.7 C) 97.9 F (36.6 C)  TempSrc:  Oral Oral Oral  Resp:  18 17   SpO2:  100% 100% 100%    Gen:  NAD Cardiovascular:  RRR, No M/R/G Respiratory: Lungs CTAB Gastrointestinal: Abdomen soft, NT/ND with normal active bowel sounds. Extremities: No C/E/C   The results of significant diagnostics from this hospitalization (including imaging, microbiology, ancillary and laboratory) are listed  below for reference.     Procedures and Diagnostic Studies:   Dg Chest 2 View  11/20/2015  CLINICAL DATA:  Sepsis EXAM: CHEST  2 VIEW COMPARISON:  06/20/2015 FINDINGS: The heart size and mediastinal contours are within normal limits. Both lungs are clear. Lingular scarring is again noted. The visualized skeletal  structures show changes of prior vertebral augmentation. IMPRESSION: No active cardiopulmonary disease. Electronically Signed   By: Alcide Clever M.D.   On: 11/20/2015 19:26   Dg Abd 1 View  11/20/2015  CLINICAL DATA:  Abdominal pain. EXAM: ABDOMEN - 1 VIEW COMPARISON:  CT chest abdomen and pelvis 06/20/2015 FINDINGS: Prominent stool-filled rectum. Remainder the colon is mostly decompressed with scattered gas and stool present. No small or large bowel distention. No radiopaque stones. Vascular calcifications. Degenerative changes in the spine and hips. IMPRESSION: Prominent stool in the rectum may indicate impaction. No evidence of bowel obstruction. Electronically Signed   By: Burman Nieves M.D.   On: 11/20/2015 23:56     Labs:   Basic Metabolic Panel:  Recent Labs Lab 11/21/15 1111 11/21/15 1512 11/22/15 0300 11/22/15 1033 11/23/15 0226 11/24/15 0317  NA 145 143 142  --  137 136  K 2.7* 2.9* 2.8*  --  2.6* 4.8  CL 113* 113* 107  --  105 108  CO2 21* 23 26  --  26 23  GLUCOSE 250* 178* 195*  --  108* 284*  BUN 8 6 <5*  --  5* 8  CREATININE 1.04* 0.80 0.64  --  0.65 0.63  CALCIUM 10.2 9.7 10.0  --  9.8 9.8  MG 1.8  --   --  1.7  --  1.4*   GFR CrCl cannot be calculated (Unknown ideal weight.). Liver Function Tests:  Recent Labs Lab 11/20/15 1835 11/21/15 0217  AST 58* 49*  ALT 99* 82*  ALKPHOS 88 82  BILITOT 1.4* 1.1  PROT 7.6 6.7  ALBUMIN 4.6 4.0    CBC:  Recent Labs Lab 11/20/15 1853 11/20/15 1955  WBC  --  8.9  NEUTROABS  --  7.3  HGB 12.6 11.2*  HCT 37.0 34.3*  MCV  --  83.5  PLT  --  216   CBG:  Recent Labs Lab 11/23/15 0634 11/23/15 1140 11/23/15 1622 11/23/15 2114 11/24/15 0615  GLUCAP 126* 300* 171* 181* 233*   Microbiology Recent Results (from the past 240 hour(s))  Blood Culture (routine x 2)     Status: None (Preliminary result)   Collection Time: 11/20/15  6:35 PM  Result Value Ref Range Status   Specimen Description BLOOD RIGHT  ANTECUBITAL  Final   Special Requests BOTTLES DRAWN AEROBIC AND ANAEROBIC 5CC  Final   Culture NO GROWTH 3 DAYS  Final   Report Status PENDING  Incomplete  Blood Culture (routine x 2)     Status: None (Preliminary result)   Collection Time: 11/20/15  6:40 PM  Result Value Ref Range Status   Specimen Description BLOOD RIGHT FOREARM  Final   Special Requests BOTTLES DRAWN AEROBIC AND ANAEROBIC 3CC  Final   Culture NO GROWTH 3 DAYS  Final   Report Status PENDING  Incomplete  Urine culture     Status: None   Collection Time: 11/20/15  8:11 PM  Result Value Ref Range Status   Specimen Description URINE, RANDOM  Final   Special Requests NONE  Final   Culture NO GROWTH  Final   Report Status 11/22/2015 FINAL  Final  MRSA  PCR Screening     Status: Abnormal   Collection Time: 11/22/15 12:36 AM  Result Value Ref Range Status   MRSA by PCR POSITIVE (A) NEGATIVE Final    Comment:        The GeneXpert MRSA Assay (FDA approved for NASAL specimens only), is one component of a comprehensive MRSA colonization surveillance program. It is not intended to diagnose MRSA infection nor to guide or monitor treatment for MRSA infections. RESULT CALLED TO, READ BACK BY AND VERIFIED WITH: BREA JOHNSON,RN @0405  11/22/15 MKELLY      Discharge Instructions:       Discharge Instructions    Call MD for:  extreme fatigue    Complete by:  As directed      Call MD for:  persistant nausea and vomiting    Complete by:  As directed      Call MD for:  severe uncontrolled pain    Complete by:  As directed      Call MD for:  temperature >100.4    Complete by:  As directed      Diet - low sodium heart healthy    Complete by:  As directed      Diet Carb Modified    Complete by:  As directed      Increase activity slowly    Complete by:  As directed             Medication List    TAKE these medications        amLODipine 10 MG tablet  Commonly known as:  NORVASC  Take 1 tablet (10 mg total) by  mouth daily.     ARIPiprazole 5 MG tablet  Commonly known as:  ABILIFY  Take 1 tablet (5 mg total) by mouth every morning.     bisacodyl 5 MG EC tablet  Commonly known as:  DULCOLAX  Take 10 mg by mouth daily as needed for mild constipation or moderate constipation.     bisacodyl 10 MG suppository  Commonly known as:  DULCOLAX  Place 1 suppository (10 mg total) rectally every other day. Hold if bowels have moved in the preceding 24 hours.     cholecalciferol 1000 units tablet  Commonly known as:  VITAMIN D  Take 2,000 Units by mouth daily.     donepezil 10 MG tablet  Commonly known as:  ARICEPT  Take 10 mg by mouth at bedtime.     feeding supplement (ENSURE ENLIVE) Liqd  Take 237 mLs by mouth 3 (three) times daily with meals.     feeding supplement (PRO-STAT SUGAR FREE 64) Liqd  Take 30 mLs by mouth 2 (two) times daily with a meal.     ferrous sulfate 325 (65 FE) MG tablet  Take 325 mg by mouth 2 (two) times daily. 8am, 5pm     insulin aspart 100 UNIT/ML injection  Commonly known as:  novoLOG  Inject 0-5 Units into the skin at bedtime.     insulin aspart 100 UNIT/ML injection  Commonly known as:  novoLOG  Inject 0-9 Units into the skin 3 (three) times daily with meals.     insulin aspart 100 UNIT/ML injection  Commonly known as:  novoLOG  Inject 3 Units into the skin 3 (three) times daily with meals.     insulin glargine 100 UNIT/ML injection  Commonly known as:  LANTUS  Inject 0.08 mLs (8 Units total) into the skin daily.     lamoTRIgine 25 MG tablet  Commonly known as:  LAMICTAL  Take 50 mg by mouth at bedtime.     LORazepam 0.5 MG tablet  Commonly known as:  ATIVAN  Take 1 tablet (0.5 mg total) by mouth at bedtime. Anxiety     metoCLOPramide 5 MG tablet  Commonly known as:  REGLAN  Take 1 tablet (5 mg total) by mouth 3 (three) times daily before meals.     pantoprazole 40 MG tablet  Commonly known as:  PROTONIX  Take 1 tablet (40 mg total) by mouth 2  (two) times daily.     polyethylene glycol packet  Commonly known as:  MIRALAX / GLYCOLAX  Take 17 g by mouth daily.     prazosin 1 MG capsule  Commonly known as:  MINIPRESS  Take 1 mg by mouth at bedtime.     rosuvastatin 5 MG tablet  Commonly known as:  CRESTOR  Take 5 mg by mouth daily at 6 PM.     sennosides-docusate sodium 8.6-50 MG tablet  Commonly known as:  SENOKOT-S  Take 2 tablets by mouth 2 (two) times daily. 8am, 5pm     sertraline 50 MG tablet  Commonly known as:  ZOLOFT  Take 50 mg by mouth 3 (three) times daily.     traMADol 50 MG tablet  Commonly known as:  ULTRAM  Take 1 tablet (50 mg total) by mouth every 6 (six) hours as needed for moderate pain.       Follow-up Information    Follow up with [name], MD. Schedule an appointment as soon as possible for a visit in 1 week.   Specialty:  Endocrinology   Why:  Hospital follow up.   Contact information:   [address] [place] [address] [phone]        Time coordinating discharge: 35 minutes.  Signed:  Jazari Ober  Pager [phone] [facility] 11/24/2015, 9:31 AM

## 2015-11-24 NOTE — Care Management Note (Signed)
Case Management Note Donn PieriniKristi Kailly Richoux RN, BSN Unit 2W-Case Manager [phone]  Patient Details  Name: Theron Aristahomasine C Simer MRN: [id number] Date of Birth: 11/23/1940  Subjective/Objective:   Pt admitted with DKA                 Action[facility]Plan[facility] CS[facility] following for return to SNF when medically stable  Expected Discharge Date:  11/24/15              Expected Discharge Plan:  Skilled Nursing Facility  In-House Referral:  Clinical Social Work  Discharge planning Services  CM Consult  Post Acute Care Choice:    Choice offered to:     DME Arranged:    DME Agency:     HH Arranged:    HH Agency:     Status of Service:  Completed, signed off  Medicare Important Message Given:    Date Medicare IM Given:    Medicare IM give by:    Date Additional Medicare IM Given:    Additional Medicare Important Message give by:     If discussed at Long Length of Stay Meetings, dates discussed:    Additional Comments:  Darrold SpanWebster, Garnett Nunziata Hall, RN 11/24/2015, 10:27 AM

## 2015-11-24 NOTE — Progress Notes (Addendum)
Patient discharge to [facility] via [facility].   Called [facility] and requested CBG check and insulin coverage.  Patient transported prior to lunch coverage. RN assessed patient and OK with transfer. Thomas HoffBurton, Clora Ohmer McClintock, RN

## 2015-11-25 LAB — CULTURE, BLOOD (ROUTINE X 2)
CULTURE: NO GROWTH
CULTURE: NO GROWTH

## 2015-12-13 ENCOUNTER — Inpatient Hospital Stay (HOSPITAL_COMMUNITY)
Admission: EM | Admit: 2015-12-13 | Discharge: 2015-12-15 | DRG: 637 | Disposition: A | Discharge status: Skilled Nursing Facility | Payer: Medicare Other | Attending: Internal Medicine | Admitting: Internal Medicine

## 2015-12-13 ENCOUNTER — Encounter (HOSPITAL_COMMUNITY): Payer: Self-pay | Admitting: *Deleted

## 2015-12-13 DIAGNOSIS — Z90710 Acquired absence of both cervix and uterus: Secondary | ICD-10-CM | POA: Diagnosis not present

## 2015-12-13 DIAGNOSIS — E039 Hypothyroidism, unspecified: Secondary | ICD-10-CM | POA: Diagnosis present

## 2015-12-13 DIAGNOSIS — D649 Anemia, unspecified: Secondary | ICD-10-CM | POA: Diagnosis present

## 2015-12-13 DIAGNOSIS — F331 Major depressive disorder, recurrent, moderate: Secondary | ICD-10-CM | POA: Diagnosis not present

## 2015-12-13 DIAGNOSIS — I25111 Atherosclerotic heart disease of native coronary artery with angina pectoris with documented spasm: Secondary | ICD-10-CM | POA: Diagnosis not present

## 2015-12-13 DIAGNOSIS — I2510 Atherosclerotic heart disease of native coronary artery without angina pectoris: Secondary | ICD-10-CM | POA: Diagnosis present

## 2015-12-13 DIAGNOSIS — R7401 Elevation of levels of liver transaminase levels: Secondary | ICD-10-CM | POA: Diagnosis present

## 2015-12-13 DIAGNOSIS — Z79899 Other long term (current) drug therapy: Secondary | ICD-10-CM | POA: Diagnosis not present

## 2015-12-13 DIAGNOSIS — Z9841 Cataract extraction status, right eye: Secondary | ICD-10-CM

## 2015-12-13 DIAGNOSIS — R32 Unspecified urinary incontinence: Secondary | ICD-10-CM | POA: Diagnosis present

## 2015-12-13 DIAGNOSIS — I4510 Unspecified right bundle-branch block: Secondary | ICD-10-CM | POA: Diagnosis present

## 2015-12-13 DIAGNOSIS — E10319 Type 1 diabetes mellitus with unspecified diabetic retinopathy without macular edema: Secondary | ICD-10-CM | POA: Diagnosis present

## 2015-12-13 DIAGNOSIS — E1043 Type 1 diabetes mellitus with diabetic autonomic (poly)neuropathy: Secondary | ICD-10-CM | POA: Diagnosis present

## 2015-12-13 DIAGNOSIS — G8929 Other chronic pain: Secondary | ICD-10-CM | POA: Diagnosis present

## 2015-12-13 DIAGNOSIS — M6281 Muscle weakness (generalized): Secondary | ICD-10-CM | POA: Diagnosis not present

## 2015-12-13 DIAGNOSIS — Z800 Family history of malignant neoplasm of digestive organs: Secondary | ICD-10-CM

## 2015-12-13 DIAGNOSIS — E1051 Type 1 diabetes mellitus with diabetic peripheral angiopathy without gangrene: Secondary | ICD-10-CM | POA: Diagnosis present

## 2015-12-13 DIAGNOSIS — Z881 Allergy status to other antibiotic agents status: Secondary | ICD-10-CM

## 2015-12-13 DIAGNOSIS — I10 Essential (primary) hypertension: Secondary | ICD-10-CM | POA: Diagnosis present

## 2015-12-13 DIAGNOSIS — Z888 Allergy status to other drugs, medicaments and biological substances status: Secondary | ICD-10-CM

## 2015-12-13 DIAGNOSIS — I252 Old myocardial infarction: Secondary | ICD-10-CM | POA: Diagnosis not present

## 2015-12-13 DIAGNOSIS — E1310 Other specified diabetes mellitus with ketoacidosis without coma: Secondary | ICD-10-CM

## 2015-12-13 DIAGNOSIS — E1110 Type 2 diabetes mellitus with ketoacidosis without coma: Secondary | ICD-10-CM | POA: Diagnosis present

## 2015-12-13 DIAGNOSIS — F0390 Unspecified dementia without behavioral disturbance: Secondary | ICD-10-CM | POA: Diagnosis not present

## 2015-12-13 DIAGNOSIS — K5909 Other constipation: Secondary | ICD-10-CM | POA: Diagnosis present

## 2015-12-13 DIAGNOSIS — Z8673 Personal history of transient ischemic attack (TIA), and cerebral infarction without residual deficits: Secondary | ICD-10-CM | POA: Diagnosis not present

## 2015-12-13 DIAGNOSIS — R569 Unspecified convulsions: Secondary | ICD-10-CM | POA: Diagnosis present

## 2015-12-13 DIAGNOSIS — L309 Dermatitis, unspecified: Secondary | ICD-10-CM | POA: Diagnosis present

## 2015-12-13 DIAGNOSIS — E43 Unspecified severe protein-calorie malnutrition: Secondary | ICD-10-CM | POA: Diagnosis not present

## 2015-12-13 DIAGNOSIS — Z66 Do not resuscitate: Secondary | ICD-10-CM | POA: Diagnosis present

## 2015-12-13 DIAGNOSIS — N179 Acute kidney failure, unspecified: Secondary | ICD-10-CM | POA: Diagnosis not present

## 2015-12-13 DIAGNOSIS — Z9842 Cataract extraction status, left eye: Secondary | ICD-10-CM | POA: Diagnosis not present

## 2015-12-13 DIAGNOSIS — M797 Fibromyalgia: Secondary | ICD-10-CM | POA: Diagnosis present

## 2015-12-13 DIAGNOSIS — F419 Anxiety disorder, unspecified: Secondary | ICD-10-CM | POA: Diagnosis present

## 2015-12-13 DIAGNOSIS — K219 Gastro-esophageal reflux disease without esophagitis: Secondary | ICD-10-CM | POA: Diagnosis present

## 2015-12-13 DIAGNOSIS — E785 Hyperlipidemia, unspecified: Secondary | ICD-10-CM | POA: Diagnosis present

## 2015-12-13 DIAGNOSIS — F329 Major depressive disorder, single episode, unspecified: Secondary | ICD-10-CM | POA: Diagnosis present

## 2015-12-13 DIAGNOSIS — E1065 Type 1 diabetes mellitus with hyperglycemia: Secondary | ICD-10-CM | POA: Diagnosis not present

## 2015-12-13 DIAGNOSIS — L89152 Pressure ulcer of sacral region, stage 2: Secondary | ICD-10-CM | POA: Diagnosis present

## 2015-12-13 DIAGNOSIS — F0391 Unspecified dementia with behavioral disturbance: Secondary | ICD-10-CM | POA: Diagnosis present

## 2015-12-13 DIAGNOSIS — Z794 Long term (current) use of insulin: Secondary | ICD-10-CM | POA: Diagnosis not present

## 2015-12-13 DIAGNOSIS — E559 Vitamin D deficiency, unspecified: Secondary | ICD-10-CM | POA: Diagnosis not present

## 2015-12-13 DIAGNOSIS — E440 Moderate protein-calorie malnutrition: Secondary | ICD-10-CM | POA: Diagnosis not present

## 2015-12-13 DIAGNOSIS — Z955 Presence of coronary angioplasty implant and graft: Secondary | ICD-10-CM

## 2015-12-13 DIAGNOSIS — E1010 Type 1 diabetes mellitus with ketoacidosis without coma: Principal | ICD-10-CM | POA: Diagnosis present

## 2015-12-13 DIAGNOSIS — Z961 Presence of intraocular lens: Secondary | ICD-10-CM | POA: Diagnosis present

## 2015-12-13 DIAGNOSIS — E108 Type 1 diabetes mellitus with unspecified complications: Secondary | ICD-10-CM | POA: Diagnosis not present

## 2015-12-13 DIAGNOSIS — Z87891 Personal history of nicotine dependence: Secondary | ICD-10-CM

## 2015-12-13 DIAGNOSIS — R945 Abnormal results of liver function studies: Secondary | ICD-10-CM | POA: Diagnosis not present

## 2015-12-13 DIAGNOSIS — K3184 Gastroparesis: Secondary | ICD-10-CM | POA: Diagnosis present

## 2015-12-13 DIAGNOSIS — R7309 Other abnormal glucose: Secondary | ICD-10-CM | POA: Diagnosis not present

## 2015-12-13 DIAGNOSIS — M545 Low back pain: Secondary | ICD-10-CM | POA: Diagnosis present

## 2015-12-13 DIAGNOSIS — R2689 Other abnormalities of gait and mobility: Secondary | ICD-10-CM | POA: Diagnosis not present

## 2015-12-13 DIAGNOSIS — L8992 Pressure ulcer of unspecified site, stage 2: Secondary | ICD-10-CM | POA: Diagnosis not present

## 2015-12-13 DIAGNOSIS — L8991 Pressure ulcer of unspecified site, stage 1: Secondary | ICD-10-CM

## 2015-12-13 DIAGNOSIS — R1111 Vomiting without nausea: Secondary | ICD-10-CM | POA: Diagnosis not present

## 2015-12-13 DIAGNOSIS — F411 Generalized anxiety disorder: Secondary | ICD-10-CM | POA: Diagnosis not present

## 2015-12-13 DIAGNOSIS — Z885 Allergy status to narcotic agent status: Secondary | ICD-10-CM | POA: Diagnosis not present

## 2015-12-13 DIAGNOSIS — R740 Nonspecific elevation of levels of transaminase and lactic acid dehydrogenase [LDH]: Secondary | ICD-10-CM | POA: Diagnosis present

## 2015-12-13 DIAGNOSIS — R278 Other lack of coordination: Secondary | ICD-10-CM | POA: Diagnosis not present

## 2015-12-13 LAB — URINALYSIS, ROUTINE W REFLEX MICROSCOPIC
Bilirubin Urine: NEGATIVE
HGB URINE DIPSTICK: NEGATIVE
KETONES UR: 15 mg/dL — AB
Leukocytes, UA: NEGATIVE
Nitrite: NEGATIVE
PH: 5 (ref 5.0–8.0)
PROTEIN: NEGATIVE mg/dL
Specific Gravity, Urine: 1.031 — ABNORMAL HIGH (ref 1.005–1.030)

## 2015-12-13 LAB — I-STAT VENOUS BLOOD GAS, ED
ACID-BASE DEFICIT: 7 mmol/L — AB (ref 0.0–2.0)
BICARBONATE: 18 meq/L — AB (ref 20.0–24.0)
O2 SAT: 81 %
PO2 VEN: 49 mmHg — AB (ref 31.0–45.0)
TCO2: 19 mmol/L (ref 0–100)
pCO2, Ven: 35.3 mmHg — ABNORMAL LOW (ref 45.0–50.0)
pH, Ven: 7.316 — ABNORMAL HIGH (ref 7.250–7.300)

## 2015-12-13 LAB — CBC
HEMATOCRIT: 33.2 % — AB (ref 36.0–46.0)
Hemoglobin: 10.6 g/dL — ABNORMAL LOW (ref 12.0–15.0)
MCH: 27.7 pg (ref 26.0–34.0)
MCHC: 31.9 g/dL (ref 30.0–36.0)
MCV: 86.7 fL (ref 78.0–100.0)
PLATELETS: 240 10*3/uL (ref 150–400)
RBC: 3.83 MIL/uL — ABNORMAL LOW (ref 3.87–5.11)
RDW: 14.1 % (ref 11.5–15.5)
WBC: 4.9 10*3/uL (ref 4.0–10.5)

## 2015-12-13 LAB — COMPREHENSIVE METABOLIC PANEL
ALBUMIN: 3.9 g/dL (ref 3.5–5.0)
ALK PHOS: 104 U/L (ref 38–126)
ALT: 78 U/L — ABNORMAL HIGH (ref 14–54)
ANION GAP: 19 — AB (ref 5–15)
AST: 60 U/L — AB (ref 15–41)
BILIRUBIN TOTAL: 1.3 mg/dL — AB (ref 0.3–1.2)
BUN: 32 mg/dL — AB (ref 6–20)
CALCIUM: 10.2 mg/dL (ref 8.9–10.3)
CO2: 17 mmol/L — ABNORMAL LOW (ref 22–32)
Chloride: 93 mmol/L — ABNORMAL LOW (ref 101–111)
Creatinine, Ser: 1.29 mg/dL — ABNORMAL HIGH (ref 0.44–1.00)
GFR calc Af Amer: 46 mL/min — ABNORMAL LOW (ref >60)
GFR, EST NON AFRICAN AMERICAN: 40 mL/min — AB (ref >60)
GLUCOSE: 597 mg/dL — AB (ref 65–99)
POTASSIUM: 3.6 mmol/L (ref 3.5–5.1)
Sodium: 129 mmol/L — ABNORMAL LOW (ref 135–145)
TOTAL PROTEIN: 6.3 g/dL — AB (ref 6.5–8.1)

## 2015-12-13 LAB — CBG MONITORING, ED
GLUCOSE-CAPILLARY: 398 mg/dL — AB (ref 65–99)
GLUCOSE-CAPILLARY: 264 mg/dL — AB (ref 65–99)
Glucose-Capillary: 544 mg/dL (ref 65–99)
Glucose-Capillary: 439 mg/dL — ABNORMAL HIGH (ref 65–99)

## 2015-12-13 LAB — URINE MICROSCOPIC-ADD ON: BACTERIA UA: NONE SEEN

## 2015-12-13 MED ORDER — SODIUM CHLORIDE 0.9 % IV SOLN
INTRAVENOUS | Status: DC
  Administered 2015-12-13: 3.8 [IU]/h via INTRAVENOUS
  Filled 2015-12-13: qty 2.5

## 2015-12-13 MED ORDER — POTASSIUM CHLORIDE IN NACL 20-0.9 MEQ/L-% IV SOLN
Freq: Once | INTRAVENOUS | Status: DC
  Filled 2015-12-13: qty 1000

## 2015-12-13 MED ORDER — DEXTROSE-NACL 5-0.45 % IV SOLN
INTRAVENOUS | Status: DC
  Administered 2015-12-14: 02:00:00 via INTRAVENOUS

## 2015-12-13 MED ORDER — DEXTROSE-NACL 5-0.45 % IV SOLN
INTRAVENOUS | Status: DC
Start: 2015-12-13 — End: 2015-12-13

## 2015-12-13 MED ORDER — SODIUM CHLORIDE 0.9 % IV SOLN
INTRAVENOUS | Status: DC
  Filled 2015-12-13: qty 2.5

## 2015-12-13 MED ORDER — POTASSIUM CHLORIDE 10 MEQ/100ML IV SOLN
10.0000 meq | INTRAVENOUS | Status: AC
  Administered 2015-12-13: 10 meq via INTRAVENOUS
  Filled 2015-12-13 (×2): qty 100

## 2015-12-13 MED ORDER — SODIUM CHLORIDE 0.9 % IV SOLN
INTRAVENOUS | Status: DC
  Administered 2015-12-13: via INTRAVENOUS

## 2015-12-13 MED ORDER — SODIUM CHLORIDE 0.9 % IV SOLN
INTRAVENOUS | Status: AC
  Administered 2015-12-13: 22:00:00 via INTRAVENOUS

## 2015-12-13 NOTE — H&P (Signed)
History and Physical  Patient Name: Beth Payne     ZOX:[id number]    DOB: [birth year]    DOA: 12/13/2015 PCP: [name], MD   Patient coming from: [facility]  Chief Complaint: Hyperglycemia  HPI: Beth Payne is a 75 y.o. female with a past medical history significant for dementia, T1DM brittle, pressure ulcer on sacrum and frequent hospitalizations for DKA who presents with DKA.  The patient has brittle type 1 diabetes for which she was admitted for DKA >5 times last fall.  In January, adjustments were made to her regimen by her endocrinologist Dr. Evlyn Kanner and she was not admitted again until 3 weeks ago, when she was again admitted for DKA.    The patient seems to be an okay historian.  She reports that she was fine until yesterday when she vomited and felt "bad".  Then last night and today her blood sugars were high, and today, they remained high despite correction insulin and so [facility] sent her to the ER.  She denies cough, congestion, dysuria, foul urine, abdominal pain, chest pain.  Lately she has been taking low dose Lantus twice daily and nightly correction insulin.  Part of her previous problem reportedly was poor oral intake.  ED course: -Afebrile, heart rate and respirations normal -Na 129, K 3.6, Cr 1.3 (baseline 0.6), WBC 4.9K, Hgb 10.6 (at baseline) -Urine showed only ketonuria and AG > 18 so she was started on insulin drip and fluids and TRH were asked to evaluate for admission       ROS: Pt complains of vomiting once, malaise.  All other systems negative except as just noted or noted in the history of present illness.    Past Medical History  Diagnosis Date  . Coronary artery disease   . Depression   . Retinopathy   . Anxiety   . Heart murmur   . TIA (transient ischemic attack)   . Arthritis     "fingers" (12/23/2014)  . Fibromyalgia   . Type I diabetes mellitus (HCC) dx'd 1953    "I'm on a pup" (12/23/2014)  . Chronic lower back pain   .  GERD (gastroesophageal reflux disease) dx'd 1995  . Osteopenia dx'd 2007  . Dementia   . Fracture, ankle 12/23/2014  . NSTEMI (non-ST elevated myocardial infarction) (HCC) 12/13/2014    Past Surgical History  Procedure Laterality Date  . Cataract extraction w/ intraocular lens  implant, bilateral Bilateral 1998  . Tonsillectomy    . Appendectomy    . Bilateral carpal tunnel release Bilateral 2003-2004  . Back surgery    . Anterior cervical decomp/discectomy fusion  05/2003    "C5-6"  . Abdominal hysterectomy  1972  . Dilation and curettage of uterus    . Cesarean section    . Eye surgery Bilateral since 1977    "numerous slaser surgeries for retinopathy"  . Trigger finger release Bilateral 2003  . Cervical disc surgery  2004    "collapsed C-5"  . Fracture surgery    . Patella fracture surgery Left 04/2007  . Vertebroplasty  01/2008    "compressed lumbar fracture"  . Laparoscopic lysis of adhesions  1975-1987 X 5  . Exploratory laparotomy  8119-1478 X 1    "adhesions"  . Oophorectomy Bilateral 1980's  . Vitrectomy Right 08/1997  . Bunionectomy with hammertoe reconstruction Right 02/1999  . Coronary angioplasty with stent placement  07/1999    "?1"  . Lumbar microdiscectomy  03/2005  . Orif ankle fracture Left  12/25/2014    Procedure: OPEN REDUCTION INTERNAL FIXATION (ORIF) LEFT TRIMALLEOLAR ANKLE FRACTURE;  Surgeon: Tarry Kos, MD;  Location: [facility] OR;  Service: Orthopedics;  Laterality: Left;    Social History: Patient lives at [facility].  She is a former smoker.  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 She uses a wheelchair.  She has moderate dementia.    Allergies  Allergen Reactions  . Diclofenac Sodium Swelling and Other (See Comments)    Blurred vision  . Doxycycline Calcium Swelling and Other (See Comments)    Blurred vision  . Codeine Nausea And Vomiting and Other (See Comments)    Dizziness   . Zocor [Simvastatin] Other (See Comments)    Dizziness, weakness     . Calcium-Containing Compounds Other (See Comments)    Calcium listed as an allergy on [facility] 08/10/15    Family history: family history includes Colon cancer in her brother; Liver disease in her sister.  Prior to Admission medications   Medication Sig Start Date End Date Taking? Authorizing Provider  Amino Acids-Protein Hydrolys (FEEDING SUPPLEMENT, PRO-STAT SUGAR FREE 64,) LIQD Take 30 mLs by mouth 2 (two) times daily with a meal. 11/24/15   Christina P Rama, MD  amLODipine (NORVASC) 10 MG tablet Take 1 tablet (10 mg total) by mouth daily. 05/22/15   Richarda Overlie, MD  ARIPiprazole (ABILIFY) 5 MG tablet Take 1 tablet (5 mg total) by mouth every morning. 04/28/15   Shanker Levora Dredge, MD  bisacodyl (DULCOLAX) 10 MG suppository Place 1 suppository (10 mg total) rectally every other day. Hold if bowels have moved in the preceding 24 hours. 11/24/15   Maryruth Bun Rama, MD  bisacodyl (DULCOLAX) 5 MG EC tablet Take 10 mg by mouth daily as needed for mild constipation or moderate constipation.    Historical Provider, MD  cholecalciferol (VITAMIN D) 1000 units tablet Take 2,000 Units by mouth daily.    Historical Provider, MD  donepezil (ARICEPT) 10 MG tablet Take 10 mg by mouth at bedtime.    Historical Provider, MD  feeding supplement, ENSURE ENLIVE, (ENSURE ENLIVE) LIQD Take 237 mLs by mouth 3 (three) times daily with meals. 11/24/15   Maryruth Bun Rama, MD  ferrous sulfate 325 (65 FE) MG tablet Take 325 mg by mouth 2 (two) times daily. 8am, 5pm    Historical Provider, MD  insulin aspart (NOVOLOG) 100 UNIT/ML injection Inject 0-5 Units into the skin at bedtime. 11/24/15   Maryruth Bun Rama, MD  insulin aspart (NOVOLOG) 100 UNIT/ML injection Inject 0-9 Units into the skin 3 (three) times daily with meals. 11/24/15   Maryruth Bun Rama, MD  insulin aspart (NOVOLOG) 100 UNIT/ML injection Inject 3 Units into the skin 3 (three) times daily with meals. 11/24/15   Maryruth Bun Rama, MD  insulin glargine (LANTUS) 100  UNIT/ML injection Inject 0.08 mLs (8 Units total) into the skin daily. 11/24/15   Maryruth Bun Rama, MD  lamoTRIgine (LAMICTAL) 25 MG tablet Take 50 mg by mouth at bedtime.    Historical Provider, MD  LORazepam (ATIVAN) 0.5 MG tablet Take 1 tablet (0.5 mg total) by mouth at bedtime. Anxiety 11/24/15   Maryruth Bun Rama, MD  metoCLOPramide (REGLAN) 5 MG tablet Take 1 tablet (5 mg total) by mouth 3 (three) times daily before meals. 11/24/15   Christina P Rama, MD  pantoprazole (PROTONIX) 40 MG tablet Take 1 tablet (40 mg total) by mouth 2 (two) times daily. Patient taking differently: Take 40 mg by mouth 2 (  two) times daily. 8am, 4pm 04/28/15   Maretta Bees, MD  polyethylene glycol (MIRALAX / GLYCOLAX) packet Take 17 g by mouth daily. 11/24/15   Christina P Rama, MD  prazosin (MINIPRESS) 1 MG capsule Take 1 mg by mouth at bedtime.     Historical Provider, MD  rosuvastatin (CRESTOR) 5 MG tablet Take 5 mg by mouth daily at 6 PM.     Historical Provider, MD  sennosides-docusate sodium (SENOKOT-S) 8.6-50 MG tablet Take 2 tablets by mouth 2 (two) times daily. 8am, 5pm    Historical Provider, MD  sertraline (ZOLOFT) 50 MG tablet Take 50 mg by mouth 3 (three) times daily.    Historical Provider, MD  traMADol (ULTRAM) 50 MG tablet Take 1 tablet (50 mg total) by mouth every 6 (six) hours as needed for moderate pain. 11/24/15   Maryruth Bun Rama, MD       Physical Exam: BP 106/48 mmHg  Pulse 81  Temp(Src) 97.4 F (36.3 C) (Oral)  Resp 19  Ht 5\' 2"  (1.575 m)  Wt 40.824 kg (90 lb)  BMI 16.46 kg/m2  SpO2 100% General appearance: Frail elderly female, alert and in no acute distress.   Eyes: Conjunctiva normal, lids and lashes normal.   Pupils small but equal and reactive.  ENT: No nasal deformity, discharge.  OP dry without lesions.   Lymph: No cervical or supraclavicular lymphadenopathy. Skin: Warm and dry.  No suspicious rashes or lesions. Cardiac: RRR, nl S1-S2, soft SEM.  Capillary refill is brisk.   JVP normal.  No LE edema.  Radial and DP pulses 2+ and symmetric. Respiratory: Normal respiratory rate and rhythm.  CTAB without rales or wheezes. GI: Abdomen soft without rigidity.  No TTP. No ascites, distension, hepatosplenomegaly.   MSK: No deformities or effusions.  Diffuse loss of muscle mass, wasting.  No clubbing/cyanosis. Neuro: Cranial nerves normal.  Oriented to [facility] and year but not Month or recent holiday.  Otherwise responding to questions, attention normal.  Speech is fluent.  Moves all extremities equally and with normal coordination and symmetric but globally weak.    Psych: Affect blunted, but pleasant.  Judgment and insight appear normal.       Labs on Admission:  I have personally reviewed following labs and imaging studies: CBC:  Recent Labs Lab 12/13/15 1935  WBC 4.9  HGB 10.6*  HCT 33.2*  MCV 86.7  PLT 240   Basic Metabolic Panel:  Recent Labs Lab 12/13/15 1935  NA 129*  K 3.6  CL 93*  CO2 17*  GLUCOSE 597*  BUN 32*  CREATININE 1.29*  CALCIUM 10.2   GFR: Estimated Creatinine Clearance: 24.6 mL/min (by C-G formula based on Cr of 1.29).  Liver Function Tests:  Recent Labs Lab 12/13/15 1935  AST 60*  ALT 78*  ALKPHOS 104  BILITOT 1.3*  PROT 6.3*  ALBUMIN 3.9   CBG:  Recent Labs Lab 12/13/15 1937 12/13/15 2122  GLUCAP 544* 439*        EKG: Independently reviewed. Rate 84, QTc 500.  Normal sinus rhythm, not atrial fibrillation.  RBBB and right axis.    Assessment/Plan 1. DKA in T1DM:  -Insulin drip -Check mag, phos -q4hrs BMP -IVF  -Hold Abilify, antipsychotic association with DKA   2. Dementia with behavioral disturbance:  -Continue donepezil and sertraline and Ativan -Hold aripiprazole  3. Normocytic anemia:  Stable -Continue iron  4. AKI:  In setting of DKA, pre-renal injury -Trend BMP  5. Pressure ulcer:  -WOC  consult  6. Severe PCM:  -Continue supplements  7. Transaminitis:  Chronic, unclear  etiology -Check fractionated bili and Abd US if direct bilirubinemia -Repeat CMP after gap closed  8. HTN:  -Continue amlodipine and prazosin with hold parameters -Continue statin  9. Hx of seizures: -Continue lamotrigine  10. Hypothyroidism: -Continue levothyroxine  11. Gastroparesis: -Continue Reglan and PPI  12. Chronic constipation: -Continue bisacodyl, Miralax  13. Prolonged QTc: -Avoid QT prolonging agents     DVT prophylaxis: Lovenox  Code Status: FULL  Family Communication: Spoke with POA Donna Bernardon Bruner by phone  Disposition Plan: Anticipate IV fluids and insulin drip overnight, close monitoring of hemodynamics and electrolytes.  Transition to subQ insulin when able. Consults called: WOC Admission status: INPATIENT, stepdown   Medical decision making: Patient seen at 9:39 PM on 12/13/2015.  The patient was discussed with Dr. Stephanie Coupister. What exists of the patient's chart was reviewed in depth.  Clinical condition: requiring close monitoring of mental status, hemodynamics, electroltes and titration of insulin drip.        Alberteen SamChristopher P Narvel Kozub [facility] Pager [phone]

## 2015-12-13 NOTE — ED Notes (Signed)
No c/o abd pain or nausea at this time.

## 2015-12-13 NOTE — ED Provider Notes (Signed)
CSN: [id number]     Arrival date & time 12/13/15  1924 History   First MD Initiated Contact with Patient 12/13/15 2010     Chief Complaint  Patient presents with  . Hyperglycemia     (Consider location/radiation/quality/duration/timing/severity/associated sxs/prior Treatment) Patient is a 75 y.o. female presenting with hyperglycemia. The history is provided by the patient.  Hyperglycemia Blood sugar level PTA:  500 Severity:  Moderate Onset quality:  Gradual Duration:  1 week Timing:  Constant Progression:  Worsening Chronicity:  Recurrent Diabetes status:  Controlled with insulin Context: not change in medication   Relieved by:  Nothing Associated symptoms: nausea, polyuria and vomiting   Associated symptoms: no abdominal pain, no chest pain, no confusion, no dizziness, no dysuria, no fever and no shortness of breath     Past Medical History  Diagnosis Date  . Coronary artery disease   . Depression   . Retinopathy   . Anxiety   . Heart murmur   . TIA (transient ischemic attack)   . Arthritis     "fingers" (12/23/2014)  . Fibromyalgia   . Type I diabetes mellitus (HCC) dx'd 1953    "I'm on a pup" (12/23/2014)  . Chronic lower back pain   . GERD (gastroesophageal reflux disease) dx'd 1995  . Osteopenia dx'd 2007  . Dementia   . Fracture, ankle 12/23/2014  . NSTEMI (non-ST elevated myocardial infarction) (HCC) 12/13/2014   Past Surgical History  Procedure Laterality Date  . Cataract extraction w/ intraocular lens  implant, bilateral Bilateral 1998  . Tonsillectomy    . Appendectomy    . Bilateral carpal tunnel release Bilateral 2003-2004  . Back surgery    . Anterior cervical decomp/discectomy fusion  05/2003    "C5-6"  . Abdominal hysterectomy  1972  . Dilation and curettage of uterus    . Cesarean section    . Eye surgery Bilateral since 1977    "numerous slaser surgeries for retinopathy"  . Trigger finger release Bilateral 2003  . Cervical disc surgery  2004    "collapsed C-5"  . Fracture surgery    . Patella fracture surgery Left 04/2007  . Vertebroplasty  01/2008    "compressed lumbar fracture"  . Laparoscopic lysis of adhesions  1975-1987 X 5  . Exploratory laparotomy  [id number] X 1    "adhesions"  . Oophorectomy Bilateral 1980's  . Vitrectomy Right 08/1997  . Bunionectomy with hammertoe reconstruction Right 02/1999  . Coronary angioplasty with stent placement  07/1999    "?1"  . Lumbar microdiscectomy  03/2005  . Orif ankle fracture Left 12/25/2014    Procedure: OPEN REDUCTION INTERNAL FIXATION (ORIF) LEFT TRIMALLEOLAR ANKLE FRACTURE;  Surgeon: Tarry Kos, MD;  Location: [facility] OR;  Service: Orthopedics;  Laterality: Left;   Family History  Problem Relation Age of Onset  . Liver disease Sister   . Colon cancer Brother    Social History  Substance Use Topics  . Smoking status: Former Smoker -- 1.00 packs/day for 27 years    Types: Cigarettes  . Smokeless tobacco: Never Used     Comment: "quit smoking cigarettes in 1985  . Alcohol Use: No   OB History    No data available     Review of Systems  Constitutional: Negative for fever and chills.  HENT: Negative for congestion and sore throat.   Eyes: Negative for pain.  Respiratory: Negative for cough and shortness of breath.   Cardiovascular: Negative for chest pain and palpitations.  Gastrointestinal:  Positive for nausea and vomiting. Negative for abdominal pain and diarrhea.  Endocrine: Positive for polyphagia and polyuria.  Genitourinary: Negative for dysuria and flank pain.  Musculoskeletal: Negative for back pain and neck pain.  Skin: Negative for rash.  Allergic/Immunologic: Negative.   Neurological: Negative for dizziness and light-headedness.  Psychiatric/Behavioral: Negative for confusion.   Allergies  Diclofenac sodium; Doxycycline calcium; Calcium-containing compounds; Codeine; and Zocor  Home Medications   Prior to Admission medications   Medication Sig Start Date  End Date Taking? Authorizing Provider  Amino Acids-Protein Hydrolys (FEEDING SUPPLEMENT, PRO-STAT SUGAR FREE 64,) LIQD Take 30 mLs by mouth 2 (two) times daily with a meal. 11/24/15  Yes Christina P Rama, MD  amLODipine (NORVASC) 10 MG tablet Take 1 tablet (10 mg total) by mouth daily. 05/22/15  Yes Richarda Overlie, MD  ARIPiprazole (ABILIFY) 5 MG tablet Take 1 tablet (5 mg total) by mouth every morning. 04/28/15  Yes Shanker Levora Dredge, MD  bisacodyl (DULCOLAX) 5 MG EC tablet Take 10 mg by mouth daily as needed for mild constipation or moderate constipation.   Yes Historical Provider, MD  cholecalciferol (VITAMIN D) 1000 units tablet Take 2,000 Units by mouth daily.   Yes Historical Provider, MD  donepezil (ARICEPT) 10 MG tablet Take 10 mg by mouth at bedtime.   Yes Historical Provider, MD  feeding supplement, ENSURE ENLIVE, (ENSURE ENLIVE) LIQD Take 237 mLs by mouth 3 (three) times daily with meals. 11/24/15  Yes Christina P Rama, MD  ferrous sulfate 325 (65 FE) MG tablet Take 325 mg by mouth 2 (two) times daily. 8am, 5pm   Yes Historical Provider, MD  insulin aspart (NOVOLOG) 100 UNIT/ML injection Inject 0-5 Units into the skin at bedtime. 11/24/15  Yes Christina P Rama, MD  insulin detemir (LEVEMIR) 100 UNIT/ML injection Inject 4-8 Units into the skin 2 (two) times daily. Takes 8 units in am and 4 units in pm   Yes Historical Provider, MD  lamoTRIgine (LAMICTAL) 25 MG tablet Take 50 mg by mouth at bedtime.   Yes Historical Provider, MD  levothyroxine (SYNTHROID, LEVOTHROID) 50 MCG tablet Take 50 mcg by mouth daily before breakfast.   Yes Historical Provider, MD  LORazepam (ATIVAN) 0.5 MG tablet Take 1 tablet (0.5 mg total) by mouth at bedtime. Anxiety 11/24/15  Yes Christina P Rama, MD  metoCLOPramide (REGLAN) 5 MG tablet Take 1 tablet (5 mg total) by mouth 3 (three) times daily before meals. 11/24/15  Yes Christina P Rama, MD  pantoprazole (PROTONIX) 40 MG tablet Take 1 tablet (40 mg total) by mouth 2  (two) times daily. Patient taking differently: Take 40 mg by mouth 2 (two) times daily. 8am, 4pm 04/28/15  Yes Shanker Levora Dredge, MD  polyethylene glycol (MIRALAX / GLYCOLAX) packet Take 17 g by mouth daily. 11/24/15  Yes Christina P Rama, MD  prazosin (MINIPRESS) 1 MG capsule Take 1 mg by mouth at bedtime.    Yes Historical Provider, MD  rosuvastatin (CRESTOR) 5 MG tablet Take 5 mg by mouth daily at 6 PM.    Yes Historical Provider, MD  sertraline (ZOLOFT) 50 MG tablet Take 50 mg by mouth 3 (three) times daily.   Yes Historical Provider, MD  traMADol (ULTRAM) 50 MG tablet Take 1 tablet (50 mg total) by mouth every 6 (six) hours as needed for moderate pain. 11/24/15  Yes Christina P Rama, MD   BP 140/68 mmHg  Pulse 78  Temp(Src) 97.4 F (36.3 C) (Oral)  Resp 16  Ht 5'  2" (1.575 m)  Wt 40.824 kg  BMI 16.46 kg/m2  SpO2 100% Physical Exam  Constitutional: She appears well-developed and well-nourished. No distress.  HENT:  Head: Normocephalic and atraumatic.  Mouth/Throat: Mucous membranes are dry.  Eyes: Conjunctivae and EOM are normal. Pupils are equal, round, and reactive to light.  Neck: Normal range of motion. Neck supple.  Cardiovascular: Normal rate, regular rhythm and normal heart sounds.   Pulmonary/Chest: Effort normal and breath sounds normal. No respiratory distress.  Abdominal: Soft. Bowel sounds are normal. There is generalized tenderness. There is no rigidity, no rebound, no guarding, no tenderness at McBurney's point and negative Murphy's sign.  Musculoskeletal: Normal range of motion.  Neurological: She is alert. She has normal reflexes. No cranial nerve deficit. GCS eye subscore is 4. GCS verbal subscore is 5. GCS motor subscore is 6.  Skin: Skin is warm and dry. She is not diaphoretic.  Psychiatric: She has a normal mood and affect.    ED Course  Procedures (including critical care time) Labs Review Labs Reviewed  CBC - Abnormal; Notable for the following:    RBC  3.83 (*)    Hemoglobin 10.6 (*)    HCT 33.2 (*)    All other components within normal limits  URINALYSIS, ROUTINE W REFLEX MICROSCOPIC (NOT AT [facility]) - Abnormal; Notable for the following:    Specific Gravity, Urine 1.031 (*)    Glucose, UA >1000 (*)    Ketones, ur 15 (*)    All other components within normal limits  COMPREHENSIVE METABOLIC PANEL - Abnormal; Notable for the following:    Sodium 129 (*)    Chloride 93 (*)    CO2 17 (*)    Glucose, Bld 597 (*)    BUN 32 (*)    Creatinine, Ser 1.29 (*)    Total Protein 6.3 (*)    AST 60 (*)    ALT 78 (*)    Total Bilirubin 1.3 (*)    GFR calc non Af Amer 40 (*)    GFR calc Af Amer 46 (*)    Anion gap 19 (*)    All other components within normal limits  URINE MICROSCOPIC-ADD ON - Abnormal; Notable for the following:    Squamous Epithelial / LPF 0-5 (*)    All other components within normal limits  BASIC METABOLIC PANEL - Abnormal; Notable for the following:    Sodium 134 (*)    CO2 18 (*)    Glucose, Bld 269 (*)    BUN 25 (*)    All other components within normal limits  CBG MONITORING, ED - Abnormal; Notable for the following:    Glucose-Capillary 544 (*)    All other components within normal limits  CBG MONITORING, ED - Abnormal; Notable for the following:    Glucose-Capillary 439 (*)    All other components within normal limits  I-STAT VENOUS BLOOD GAS, ED - Abnormal; Notable for the following:    pH, Ven 7.316 (*)    pCO2, Ven 35.3 (*)    pO2, Ven 49.0 (*)    Bicarbonate 18.0 (*)    Acid-base deficit 7.0 (*)    All other components within normal limits  CBG MONITORING, ED - Abnormal; Notable for the following:    Glucose-Capillary 398 (*)    All other components within normal limits  CBG MONITORING, ED - Abnormal; Notable for the following:    Glucose-Capillary 264 (*)    All other components within normal limits  CBG  MONITORING, ED - Abnormal; Notable for the following:    Glucose-Capillary 194 (*)    All other  components within normal limits  PHOSPHORUS  BASIC METABOLIC PANEL  BASIC METABOLIC PANEL    Imaging Review No results found. I have personally reviewed and evaluated these images and lab results as part of my medical decision-making.   EKG Interpretation   Date/Time:  Saturday December 13 2015 19:39:30 EDT Ventricular Rate:  84 PR Interval:    QRS Duration: 131 QT Interval:  422 QTC Calculation: 499 R Axis:   127 Text Interpretation:  Atrial fibrillation RBBB and LPFB T wave flattening  in precordial leads compared to previous Confirmed by LITTLE MD, RACHEL  [phone]) on 12/13/2015 9:10:26 PM      MDM   Final diagnoses:  Diabetic ketoacidosis without coma associated with other specified diabetes mellitus (HCC)    The pt is a 75 yo F with a hx of dementia, DM, CAD, TIA, arthritis, GERD, fibromyalgia, and osteopenia who presents to the Emergency Department with hyperglycemia.   Bicarb 17 and anion gap 19.  T bili and AST/ALT midly elevated.  No RUQ pain on exam however.  VBG pending.  Likely early DKA.  No evidence of infection on exam or UA.  Doubt PNA  Fluid bolus given and insulin drip began.  Discussed with hospitalist who agrees to admission to step down.    Labs were viewed by myself and incorporated into medical decision making.  Discussed pertinent finding with patient or caregiver prior to admission with no further questions.  Pt care supervised by my attending Dr. Clarene Duke.    Tery Sanfilippo, MD PGY-3 Emergency Medicine  Tery Sanfilippo, MD 12/14/15 0127  Laurence Spates, MD 12/15/15 [phone]

## 2015-12-13 NOTE — ED Notes (Addendum)
Lab called with critical glucose 597 Dr Lendon Collariester notified

## 2015-12-13 NOTE — ED Notes (Signed)
Patient presents via EMS from [facility].  Staff reported her sugar has been up all day and has been given extra insulin twice today but her sugar is still running high.  EMS repported she has a history of not being able to control her sugar.  Vomited this afternoon but not since then.  Patient alert and oriented  BP 130/54 HR 74 resp 18

## 2015-12-14 DIAGNOSIS — E1010 Type 1 diabetes mellitus with ketoacidosis without coma: Principal | ICD-10-CM

## 2015-12-14 DIAGNOSIS — R7401 Elevation of levels of liver transaminase levels: Secondary | ICD-10-CM | POA: Insufficient documentation

## 2015-12-14 DIAGNOSIS — R740 Nonspecific elevation of levels of transaminase and lactic acid dehydrogenase [LDH]: Secondary | ICD-10-CM

## 2015-12-14 DIAGNOSIS — E43 Unspecified severe protein-calorie malnutrition: Secondary | ICD-10-CM

## 2015-12-14 DIAGNOSIS — D649 Anemia, unspecified: Secondary | ICD-10-CM

## 2015-12-14 DIAGNOSIS — N179 Acute kidney failure, unspecified: Secondary | ICD-10-CM

## 2015-12-14 LAB — GLUCOSE, CAPILLARY
GLUCOSE-CAPILLARY: 101 mg/dL — AB (ref 65–99)
GLUCOSE-CAPILLARY: 106 mg/dL — AB (ref 65–99)
GLUCOSE-CAPILLARY: 146 mg/dL — AB (ref 65–99)
GLUCOSE-CAPILLARY: 152 mg/dL — AB (ref 65–99)
GLUCOSE-CAPILLARY: 136 mg/dL — AB (ref 65–99)
GLUCOSE-CAPILLARY: 207 mg/dL — AB (ref 65–99)
Glucose-Capillary: 92 mg/dL (ref 65–99)
Glucose-Capillary: 95 mg/dL (ref 65–99)
Glucose-Capillary: 154 mg/dL — ABNORMAL HIGH (ref 65–99)

## 2015-12-14 LAB — CBG MONITORING, ED
Glucose-Capillary: 123 mg/dL — ABNORMAL HIGH (ref 65–99)
Glucose-Capillary: 194 mg/dL — ABNORMAL HIGH (ref 65–99)

## 2015-12-14 LAB — CBC
HCT: 28.4 % — ABNORMAL LOW (ref 36.0–46.0)
Hemoglobin: 9.4 g/dL — ABNORMAL LOW (ref 12.0–15.0)
MCH: 27.8 pg (ref 26.0–34.0)
MCHC: 33.1 g/dL (ref 30.0–36.0)
MCV: 84 fL (ref 78.0–100.0)
PLATELETS: 222 10*3/uL (ref 150–400)
RBC: 3.38 MIL/uL — AB (ref 3.87–5.11)
RDW: 14.1 % (ref 11.5–15.5)
WBC: 5.4 10*3/uL (ref 4.0–10.5)

## 2015-12-14 LAB — BASIC METABOLIC PANEL
Anion gap: 12 (ref 5–15)
Anion gap: 9 (ref 5–15)
BUN: 17 mg/dL (ref 6–20)
BUN: 25 mg/dL — AB (ref 6–20)
CHLORIDE: 104 mmol/L (ref 101–111)
CHLORIDE: 105 mmol/L (ref 101–111)
CO2: 18 mmol/L — ABNORMAL LOW (ref 22–32)
CO2: 22 mmol/L (ref 22–32)
Calcium: 9.3 mg/dL (ref 8.9–10.3)
Calcium: 9.5 mg/dL (ref 8.9–10.3)
Creatinine, Ser: 0.6 mg/dL (ref 0.44–1.00)
Creatinine, Ser: 0.87 mg/dL (ref 0.44–1.00)
GFR calc Af Amer: >60 mL/min (ref >60)
GFR calc Af Amer: >60 mL/min (ref >60)
GFR calc non Af Amer: >60 mL/min (ref >60)
GFR calc non Af Amer: >60 mL/min (ref >60)
GLUCOSE: 269 mg/dL — AB (ref 65–99)
GLUCOSE: 95 mg/dL (ref 65–99)
POTASSIUM: 4.2 mmol/L (ref 3.5–5.1)
POTASSIUM: 4.3 mmol/L (ref 3.5–5.1)
Sodium: 134 mmol/L — ABNORMAL LOW (ref 135–145)
Sodium: 136 mmol/L (ref 135–145)

## 2015-12-14 LAB — PHOSPHORUS: PHOSPHORUS: 2.7 mg/dL (ref 2.5–4.6)

## 2015-12-14 LAB — MAGNESIUM: Magnesium: 1.7 mg/dL (ref 1.7–2.4)

## 2015-12-14 LAB — MRSA PCR SCREENING: MRSA BY PCR: POSITIVE — AB

## 2015-12-14 LAB — BILIRUBIN, FRACTIONATED(TOT/DIR/INDIR): Total Bilirubin: 0.5 mg/dL (ref 0.3–1.2)

## 2015-12-14 MED ORDER — MUPIROCIN 2 % EX OINT
1.0000 "application " | TOPICAL_OINTMENT | Freq: Two times a day (BID) | CUTANEOUS | Status: DC
  Administered 2015-12-14 – 2015-12-15 (×3): 1 via NASAL
  Filled 2015-12-14: qty 22

## 2015-12-14 MED ORDER — FERROUS SULFATE 325 (65 FE) MG PO TABS
325.0000 mg | ORAL_TABLET | Freq: Two times a day (BID) | ORAL | Status: DC
  Administered 2015-12-14 – 2015-12-15 (×3): 325 mg via ORAL
  Filled 2015-12-14 (×3): qty 1

## 2015-12-14 MED ORDER — SERTRALINE HCL 50 MG PO TABS
50.0000 mg | ORAL_TABLET | Freq: Three times a day (TID) | ORAL | Status: DC
  Administered 2015-12-14 – 2015-12-15 (×4): 50 mg via ORAL
  Filled 2015-12-14 (×4): qty 1

## 2015-12-14 MED ORDER — BISACODYL 5 MG PO TBEC: 10.0000 mg | DELAYED_RELEASE_TABLET | Freq: Every day | ORAL | Status: DC | PRN

## 2015-12-14 MED ORDER — TRAMADOL HCL 50 MG PO TABS: 50.0000 mg | ORAL_TABLET | Freq: Four times a day (QID) | ORAL | Status: DC | PRN

## 2015-12-14 MED ORDER — LORAZEPAM 0.5 MG PO TABS
0.5000 mg | ORAL_TABLET | Freq: Every day | ORAL | Status: DC
  Administered 2015-12-14: 0.5 mg via ORAL
  Filled 2015-12-14: qty 1

## 2015-12-14 MED ORDER — LAMOTRIGINE 25 MG PO TABS
50.0000 mg | ORAL_TABLET | Freq: Every day | ORAL | Status: DC
  Administered 2015-12-14: 50 mg via ORAL
  Filled 2015-12-14 (×2): qty 2

## 2015-12-14 MED ORDER — LEVOTHYROXINE SODIUM 50 MCG PO TABS
50.0000 ug | ORAL_TABLET | Freq: Every day | ORAL | Status: DC
  Administered 2015-12-14 – 2015-12-15 (×2): 50 ug via ORAL
  Filled 2015-12-14 (×2): qty 1

## 2015-12-14 MED ORDER — CHLORHEXIDINE GLUCONATE CLOTH 2 % EX PADS
6.0000 | MEDICATED_PAD | Freq: Every day | CUTANEOUS | Status: DC
  Administered 2015-12-15: 6 via TOPICAL

## 2015-12-14 MED ORDER — ACETAMINOPHEN 325 MG PO TABS: 650.0000 mg | ORAL_TABLET | Freq: Four times a day (QID) | ORAL | Status: DC | PRN

## 2015-12-14 MED ORDER — METOCLOPRAMIDE HCL 10 MG PO TABS
5.0000 mg | ORAL_TABLET | Freq: Three times a day (TID) | ORAL | Status: DC
  Administered 2015-12-14 – 2015-12-15 (×5): 5 mg via ORAL
  Filled 2015-12-14 (×5): qty 1

## 2015-12-14 MED ORDER — ROSUVASTATIN CALCIUM 10 MG PO TABS
5.0000 mg | ORAL_TABLET | Freq: Every day | ORAL | Status: DC
  Administered 2015-12-14: 5 mg via ORAL
  Filled 2015-12-14: qty 1

## 2015-12-14 MED ORDER — INSULIN GLARGINE 100 UNIT/ML ~~LOC~~ SOLN
4.0000 [IU] | Freq: Every day | SUBCUTANEOUS | Status: DC
  Filled 2015-12-14: qty 0.04

## 2015-12-14 MED ORDER — ENSURE ENLIVE PO LIQD
237.0000 mL | Freq: Three times a day (TID) | ORAL | Status: DC
  Administered 2015-12-14 – 2015-12-15 (×4): 237 mL via ORAL

## 2015-12-14 MED ORDER — POLYETHYLENE GLYCOL 3350 17 G PO PACK
17.0000 g | PACK | Freq: Every day | ORAL | Status: DC
  Administered 2015-12-14: 17 g via ORAL
  Filled 2015-12-14 (×2): qty 1

## 2015-12-14 MED ORDER — SENNOSIDES-DOCUSATE SODIUM 8.6-50 MG PO TABS
2.0000 | ORAL_TABLET | Freq: Two times a day (BID) | ORAL | Status: DC
  Administered 2015-12-14 – 2015-12-15 (×2): 2 via ORAL
  Filled 2015-12-14 (×3): qty 2

## 2015-12-14 MED ORDER — ACETAMINOPHEN 650 MG RE SUPP: 650.0000 mg | Freq: Four times a day (QID) | RECTAL | Status: DC | PRN

## 2015-12-14 MED ORDER — PANTOPRAZOLE SODIUM 40 MG PO TBEC
40.0000 mg | DELAYED_RELEASE_TABLET | Freq: Two times a day (BID) | ORAL | Status: DC
  Administered 2015-12-14 – 2015-12-15 (×3): 40 mg via ORAL
  Filled 2015-12-14 (×3): qty 1

## 2015-12-14 MED ORDER — DONEPEZIL HCL 10 MG PO TABS
10.0000 mg | ORAL_TABLET | Freq: Every day | ORAL | Status: DC
  Administered 2015-12-14: 10 mg via ORAL
  Filled 2015-12-14 (×2): qty 1

## 2015-12-14 MED ORDER — INSULIN GLARGINE 100 UNIT/ML ~~LOC~~ SOLN
5.0000 [IU] | Freq: Every day | SUBCUTANEOUS | Status: DC
  Administered 2015-12-14: 5 [IU] via SUBCUTANEOUS
  Filled 2015-12-14 (×2): qty 0.05

## 2015-12-14 MED ORDER — INSULIN ASPART 100 UNIT/ML ~~LOC~~ SOLN
0.0000 [IU] | Freq: Three times a day (TID) | SUBCUTANEOUS | Status: DC
  Administered 2015-12-14: 2 [IU] via SUBCUTANEOUS
  Administered 2015-12-14 (×2): 1 [IU] via SUBCUTANEOUS

## 2015-12-14 MED ORDER — SODIUM CHLORIDE 0.9% FLUSH
3.0000 mL | Freq: Two times a day (BID) | INTRAVENOUS | Status: DC
  Administered 2015-12-14 – 2015-12-15 (×3): 3 mL via INTRAVENOUS

## 2015-12-14 MED ORDER — PRO-STAT SUGAR FREE PO LIQD
30.0000 mL | Freq: Two times a day (BID) | ORAL | Status: DC
  Administered 2015-12-14 (×2): 30 mL via ORAL
  Filled 2015-12-14 (×3): qty 30

## 2015-12-14 MED ORDER — AMLODIPINE BESYLATE 10 MG PO TABS
10.0000 mg | ORAL_TABLET | Freq: Every day | ORAL | Status: DC
  Administered 2015-12-14 – 2015-12-15 (×2): 10 mg via ORAL
  Filled 2015-12-14 (×2): qty 1

## 2015-12-14 MED ORDER — PRAZOSIN HCL 1 MG PO CAPS
1.0000 mg | ORAL_CAPSULE | Freq: Every day | ORAL | Status: DC
  Administered 2015-12-14: 1 mg via ORAL
  Filled 2015-12-14 (×2): qty 1

## 2015-12-14 MED ORDER — ENOXAPARIN SODIUM 30 MG/0.3ML ~~LOC~~ SOLN
20.0000 mg | SUBCUTANEOUS | Status: DC
  Administered 2015-12-14 – 2015-12-15 (×2): 20 mg via SUBCUTANEOUS
  Filled 2015-12-14: qty 0.2
  Filled 2015-12-14: qty 0.3
  Filled 2015-12-14: qty 0.2
  Filled 2015-12-14: qty 0.3

## 2015-12-14 NOTE — Progress Notes (Signed)
Patient doing well, off insulin drip and CBG's are in the low 100's. No complaint of pain, good appetite, has slept for most of the afternoon. No questions or concerns. Takes pills whole with no concerns. Vitals are stable, call bell in reach, will continue to monitor.

## 2015-12-14 NOTE — Consult Note (Signed)
WOC wound consult note Reason for Consult: Sacral tissue loss secondary to moisture associated skin damage (specifically incontinence associated dermatitis (IAD)).  Previously healed full thickness tissue injuries to left shoulder and left lateral knee. Wound type:Moisture associated skin damage, previously healed full thickness tissue loss to left shoulder and left knee.  Pressure Ulcer POA: Yes Measurement: left shoulder measures 4cm x 2.4cm with no depth (healed, scarred). Left lateral knee measures 3.5cm x 2cm with no depth (healed, scarred). Buttocks and sacrum with 10cm x 16cm area of blanching erythema with maceration and epidermal peeling. Healed area (scar) on left posterior heel. Measuring 1cm round. Wound bed: As described above. Drainage (amount, consistency, odor) None. Periwound:Intact, dry Dressing procedure/placement/frequency: Patient is on a therapeutic mattress and is bring turned side to side.  Urinary incontinence care is timely and house products are used to cleanse and provide a moisture barrier.  I have added bilateral pressure redistribution heel boots and guidance on HOB elevation to reduce shear forces in the area with MASD, specifically IAD. WOC nursing team will not follow, but will remain available to this patient, the nursing and medical teams.  Please re-consult if needed. Thanks, Ladona MowLaurie Darline Faith, MSN, RN, GNP, Hans EdenCWOCN, CWON-AP, FAAN  Pager# [phone]

## 2015-12-14 NOTE — Progress Notes (Signed)
Lantus 5u given as ordered this morning. Insulin drip stopped at 1000 as well as fluid therapy per order protocol. Converting to ACHS starting at 1200. Patient doing well, CBG's have been in the low 100's this morning. Will continue to monitor.

## 2015-12-14 NOTE — Progress Notes (Signed)
PROGRESS NOTE  Beth Payne:[id number] DOB: July 31, 1940 DOA: 12/13/2015 PCP: [name], MD  Brief History:  75 year old female with a history of type 1 diabetes mellitus, dementia, hypertension, gastroparesis, hypothyroidism, and hyperlipidemia presented from [facility] with one-day history of nausea and vomiting with elevated blood sugars. The patient states that she has been compliant with her insulin regimen and has not refused any insulin. She denies any recent fevers, chills, chest pain, short of breath, diarrhea, headache, rashes. She states that her family frequently brings her food from outside the nursing facility, but she states that she has not been "cheating" on her diet. The patient has had numerous admissions for DKA including her most recent admission from 11/20/2015 through 11/24/2015 after which she was discharged with Lantus 8 units daily.  Assessment/Plan: DKA in T1DM:  -patient started on IV insulin with q 1 hour CBG check and q 4 hour BMPs -pt started on aggressive fluid resuscitation -Electrolytes were monitored and repleted -transitioned to [facility] Lantus on 7/9 -advance diet -11/21/15 HbA1C--10.3 -personally reviewed EKG--sinus RBBB, LPFB  Dementia with behavioral disturbance:  -Continue donepezil and sertraline and Ativan -Restart aripiprazole and lamotrigine  Normocytic anemia:  Stable -Continue iron  AKI:  In setting of DKA, pre-renal injury -am BMP -baseline creatinine 0.5-0.8 -serum creatinine peaked at 1.29  Pressure ulcer:  -WOC consult  Severe PCM:  -Continue supplements  Transaminitis:  -appears chronic -suspect NASH -RUQ ultrasound -hep c antibody -hep b surface antigen -monitor closely on statin  HTN:  -Continue amlodipine and prazosin with hold parameters -Continue statin  Hypothyroidism: -Continue levothyroxine  Gastroparesis: -Continue Reglan and PPI  Chronic constipation: -Continue bisacodyl,  Miralax  Prolonged QTc: -monitor on tele  Disposition Plan:   Home in 1-2days  Family Communication:   No Family at bedside  Consultants:  None  Code Status:   DNR  DVT Prophylaxis:  [place] Lovenox   Procedures: As Listed in Progress Note Above  Antibiotics: None    Subjective: Patient denies fevers, chills, headache, chest pain, dyspnea, nausea, vomiting, diarrhea, abdominal pain, dysuria, hematuria   Objective: Filed Vitals:   12/14/15 0400 12/14/15 0415 12/14/15 0733 12/14/15 0800  BP: 150/60 158/67 153/64 135/69  Pulse: 72 78 70 67  Temp: 98.1 F (36.7 C)  98.1 F (36.7 C)   TempSrc: Oral  Oral   Resp: Height:      Weight:      SpO2: 99% 98% 98% 98%    Intake/Output Summary (Last 24 hours) at 12/14/15 0852 Last data filed at 12/14/15 0400  Gross per 24 hour  Intake 1266.25 ml  Output      0 ml  Net 1266.25 ml   Weight change:  Exam:   General:  Pt is alert, follows commands appropriately, not in acute distress  HEENT: No icterus, No thrush, No neck mass, [place]/AT  Cardiovascular: RRR, S1/S2, no rubs, no gallops  Respiratory: CTA bilaterally, no wheezing, no crackles, no rhonchi  Abdomen: Soft/+BS, non tender, non distended, no guarding  Extremities: No edema, No lymphangitis, No petechiae, No rashes, no synovitis   Data Reviewed: I have personally reviewed following labs and imaging studies Basic Metabolic Panel:  Recent Labs Lab 12/13/15 1935 12/13/15 2353 12/14/15 0344  NA 129* 134* 136  K 3.6 4.3 4.2  CL 93* 104 105  CO2 17* 18* 22  GLUCOSE 597* 269* 95  BUN 32* 25* 17  CREATININE 1.29*  0.87 0.60  CALCIUM 10.2 9.3 9.5  PHOS  --  2.7  --    Liver Function Tests:  Recent Labs Lab 12/13/15 1935  AST 60*  ALT 78*  ALKPHOS 104  BILITOT 1.3*  PROT 6.3*  ALBUMIN 3.9   No results for input(s): LIPASE, AMYLASE in the last 168 hours. No results for input(s): AMMONIA in the last 168 hours. Coagulation Profile: No  results for input(s): INR, PROTIME in the last 168 hours. CBC:  Recent Labs Lab 12/13/15 1935  WBC 4.9  HGB 10.6*  HCT 33.2*  MCV 86.7  PLT 240   Cardiac Enzymes: No results for input(s): CKTOTAL, CKMB, CKMBINDEX, TROPONINI in the last 168 hours. BNP: Invalid input(s): POCBNP CBG:  Recent Labs Lab 12/14/15 0308 12/14/15 0422 12/14/15 0528 12/14/15 0635 12/14/15 0732  GLUCAP 101* 92 95 106* 146*   HbA1C: No results for input(s): HGBA1C in the last 72 hours. Urine analysis:    Component Value Date/Time   COLORURINE YELLOW 12/13/2015 2026   APPEARANCEUR CLEAR 12/13/2015 2026   LABSPEC 1.031* 12/13/2015 2026   PHURINE 5.0 12/13/2015 2026   GLUCOSEU >1000* 12/13/2015 2026   HGBUR NEGATIVE 12/13/2015 2026   BILIRUBINUR NEGATIVE 12/13/2015 2026   KETONESUR 15* 12/13/2015 2026   PROTEINUR NEGATIVE 12/13/2015 2026   UROBILINOGEN 0.2 02/23/2015 1433   NITRITE NEGATIVE 12/13/2015 2026   LEUKOCYTESUR NEGATIVE 12/13/2015 2026   Sepsis Labs: @LABRCNTIP (procalcitonin:4,lacticidven:4) ) Recent Results (from the past 240 hour(s))  MRSA PCR Screening     Status: Abnormal   Collection Time: 12/14/15  3:44 AM  Result Value Ref Range Status   MRSA by PCR POSITIVE (A) NEGATIVE Final    Comment:        The GeneXpert MRSA Assay (FDA approved for NASAL specimens only), is one component of a comprehensive MRSA colonization surveillance program. It is not intended to diagnose MRSA infection nor to guide or monitor treatment for MRSA infections. RESULT CALLED TO, READ BACK BY AND VERIFIED WITHBlair Heys RN 1610 12/14/15 MITCHELL,L      Scheduled Meds: . amLODipine  10 mg Oral Daily  . Chlorhexidine Gluconate Cloth  6 each Topical Q0600  . donepezil  10 mg Oral QHS  . enoxaparin (LOVENOX) injection  20 mg Subcutaneous Q24H  . feeding supplement (ENSURE ENLIVE)  237 mL Oral TID WC  . feeding supplement (PRO-STAT SUGAR FREE 64)  30 mL Oral BID WC  . ferrous sulfate  325 mg  Oral BID WC  . insulin aspart  0-9 Units Subcutaneous TID WC  . insulin glargine  5 Units Subcutaneous Daily  . lamoTRIgine  50 mg Oral QHS  . levothyroxine  50 mcg Oral QAC breakfast  . LORazepam  0.5 mg Oral QHS  . metoCLOPramide  5 mg Oral TID AC  . mupirocin ointment  1 application Nasal BID  . pantoprazole  40 mg Oral BID  . polyethylene glycol  17 g Oral Daily  . prazosin  1 mg Oral QHS  . rosuvastatin  5 mg Oral q1800  . senna-docusate  2 tablet Oral BID  . sertraline  50 mg Oral TID  . sodium chloride flush  3 mL Intravenous Q12H   Continuous Infusions: . sodium chloride Stopped (12/14/15 0146)  . dextrose 5 % and 0.45% NaCl 75 mL/hr at 12/14/15 0400  . insulin (NOVOLIN-R) infusion Stopped (12/14/15 0423)    Procedures/Studies: Dg Chest 2 View  11/20/2015  CLINICAL DATA:  Sepsis EXAM: CHEST  2 VIEW COMPARISON:  06/20/2015 FINDINGS: The heart size and mediastinal contours are within normal limits. Both lungs are clear. Lingular scarring is again noted. The visualized skeletal structures show changes of prior vertebral augmentation. IMPRESSION: No active cardiopulmonary disease. Electronically Signed   By: Alcide CleverMark  Lukens M.D.   On: 11/20/2015 19:26   Dg Abd 1 View  11/20/2015  CLINICAL DATA:  Abdominal pain. EXAM: ABDOMEN - 1 VIEW COMPARISON:  CT chest abdomen and pelvis 06/20/2015 FINDINGS: Prominent stool-filled rectum. Remainder the colon is mostly decompressed with scattered gas and stool present. No small or large bowel distention. No radiopaque stones. Vascular calcifications. Degenerative changes in the spine and hips. IMPRESSION: Prominent stool in the rectum may indicate impaction. No evidence of bowel obstruction. Electronically Signed   By: Burman NievesWilliam  Stevens M.D.   On: 11/20/2015 23:56    Margi Edmundson, DO  [facility] Pager [phone]  If 7PM-7AM, please contact night-coverage [email or web address] Password [email or web address] 12/14/2015, 8:52 AM   LOS: 1 day

## 2015-12-15 ENCOUNTER — Inpatient Hospital Stay (HOSPITAL_COMMUNITY): Payer: Medicare Other

## 2015-12-15 DIAGNOSIS — E440 Moderate protein-calorie malnutrition: Secondary | ICD-10-CM | POA: Diagnosis not present

## 2015-12-15 DIAGNOSIS — F411 Generalized anxiety disorder: Secondary | ICD-10-CM | POA: Diagnosis not present

## 2015-12-15 DIAGNOSIS — D649 Anemia, unspecified: Secondary | ICD-10-CM | POA: Diagnosis not present

## 2015-12-15 DIAGNOSIS — G9349 Other encephalopathy: Secondary | ICD-10-CM | POA: Diagnosis not present

## 2015-12-15 DIAGNOSIS — K3184 Gastroparesis: Secondary | ICD-10-CM | POA: Diagnosis not present

## 2015-12-15 DIAGNOSIS — E1310 Other specified diabetes mellitus with ketoacidosis without coma: Secondary | ICD-10-CM | POA: Diagnosis not present

## 2015-12-15 DIAGNOSIS — D6489 Other specified anemias: Secondary | ICD-10-CM | POA: Diagnosis not present

## 2015-12-15 DIAGNOSIS — E871 Hypo-osmolality and hyponatremia: Secondary | ICD-10-CM | POA: Diagnosis not present

## 2015-12-15 DIAGNOSIS — E1065 Type 1 diabetes mellitus with hyperglycemia: Secondary | ICD-10-CM | POA: Diagnosis not present

## 2015-12-15 DIAGNOSIS — F0390 Unspecified dementia without behavioral disturbance: Secondary | ICD-10-CM | POA: Diagnosis not present

## 2015-12-15 DIAGNOSIS — E119 Type 2 diabetes mellitus without complications: Secondary | ICD-10-CM | POA: Diagnosis not present

## 2015-12-15 DIAGNOSIS — I10 Essential (primary) hypertension: Secondary | ICD-10-CM | POA: Diagnosis not present

## 2015-12-15 DIAGNOSIS — R2689 Other abnormalities of gait and mobility: Secondary | ICD-10-CM | POA: Diagnosis not present

## 2015-12-15 DIAGNOSIS — R945 Abnormal results of liver function studies: Secondary | ICD-10-CM | POA: Diagnosis not present

## 2015-12-15 DIAGNOSIS — F331 Major depressive disorder, recurrent, moderate: Secondary | ICD-10-CM | POA: Diagnosis not present

## 2015-12-15 DIAGNOSIS — E559 Vitamin D deficiency, unspecified: Secondary | ICD-10-CM | POA: Diagnosis not present

## 2015-12-15 DIAGNOSIS — E785 Hyperlipidemia, unspecified: Secondary | ICD-10-CM | POA: Diagnosis not present

## 2015-12-15 DIAGNOSIS — E784 Other hyperlipidemia: Secondary | ICD-10-CM | POA: Diagnosis not present

## 2015-12-15 DIAGNOSIS — E43 Unspecified severe protein-calorie malnutrition: Secondary | ICD-10-CM | POA: Diagnosis not present

## 2015-12-15 DIAGNOSIS — E1010 Type 1 diabetes mellitus with ketoacidosis without coma: Secondary | ICD-10-CM | POA: Diagnosis not present

## 2015-12-15 DIAGNOSIS — B351 Tinea unguium: Secondary | ICD-10-CM | POA: Diagnosis not present

## 2015-12-15 DIAGNOSIS — L8992 Pressure ulcer of unspecified site, stage 2: Secondary | ICD-10-CM | POA: Diagnosis not present

## 2015-12-15 DIAGNOSIS — M6281 Muscle weakness (generalized): Secondary | ICD-10-CM | POA: Diagnosis not present

## 2015-12-15 DIAGNOSIS — R278 Other lack of coordination: Secondary | ICD-10-CM | POA: Diagnosis not present

## 2015-12-15 DIAGNOSIS — R627 Adult failure to thrive: Secondary | ICD-10-CM | POA: Diagnosis not present

## 2015-12-15 DIAGNOSIS — M79671 Pain in right foot: Secondary | ICD-10-CM | POA: Diagnosis not present

## 2015-12-15 DIAGNOSIS — L89152 Pressure ulcer of sacral region, stage 2: Secondary | ICD-10-CM | POA: Diagnosis not present

## 2015-12-15 DIAGNOSIS — M79672 Pain in left foot: Secondary | ICD-10-CM | POA: Diagnosis not present

## 2015-12-15 DIAGNOSIS — N179 Acute kidney failure, unspecified: Secondary | ICD-10-CM | POA: Diagnosis not present

## 2015-12-15 LAB — GLUCOSE, CAPILLARY
GLUCOSE-CAPILLARY: 483 mg/dL — AB (ref 65–99)
GLUCOSE-CAPILLARY: 323 mg/dL — AB (ref 65–99)
GLUCOSE-CAPILLARY: 95 mg/dL (ref 65–99)

## 2015-12-15 LAB — COMPREHENSIVE METABOLIC PANEL
ALBUMIN: 3.2 g/dL — AB (ref 3.5–5.0)
ALT: 50 U/L (ref 14–54)
ANION GAP: 6 (ref 5–15)
AST: 30 U/L (ref 15–41)
Alkaline Phosphatase: 83 U/L (ref 38–126)
BUN: 11 mg/dL (ref 6–20)
CHLORIDE: 102 mmol/L (ref 101–111)
CO2: 26 mmol/L (ref 22–32)
Calcium: 9.4 mg/dL (ref 8.9–10.3)
Creatinine, Ser: 0.67 mg/dL (ref 0.44–1.00)
GFR calc Af Amer: >60 mL/min (ref >60)
GFR calc non Af Amer: >60 mL/min (ref >60)
GLUCOSE: 342 mg/dL — AB (ref 65–99)
POTASSIUM: 4.2 mmol/L (ref 3.5–5.1)
SODIUM: 134 mmol/L — AB (ref 135–145)
Total Bilirubin: 0.7 mg/dL (ref 0.3–1.2)
Total Protein: 5.4 g/dL — ABNORMAL LOW (ref 6.5–8.1)

## 2015-12-15 LAB — MAGNESIUM: Magnesium: 1.7 mg/dL (ref 1.7–2.4)

## 2015-12-15 LAB — HIV ANTIBODY (ROUTINE TESTING W REFLEX): HIV Screen 4th Generation wRfx: NONREACTIVE

## 2015-12-15 MED ORDER — TRAMADOL HCL 50 MG PO TABS: 50.0000 mg | ORAL_TABLET | Freq: Four times a day (QID) | ORAL | Status: DC | PRN

## 2015-12-15 MED ORDER — INSULIN ASPART 100 UNIT/ML ~~LOC~~ SOLN: 0.0000 [IU] | Freq: Every day | SUBCUTANEOUS | Status: DC

## 2015-12-15 MED ORDER — INSULIN ASPART 100 UNIT/ML ~~LOC~~ SOLN: SUBCUTANEOUS | Status: DC

## 2015-12-15 MED ORDER — INSULIN GLARGINE 100 UNIT/ML ~~LOC~~ SOLN
8.0000 [IU] | Freq: Every day | SUBCUTANEOUS | Status: DC
  Filled 2015-12-15: qty 0.08

## 2015-12-15 MED ORDER — INSULIN DETEMIR 100 UNIT/ML ~~LOC~~ SOLN
4.0000 [IU] | Freq: Every day | SUBCUTANEOUS | Status: DC
  Filled 2015-12-15: qty 0.04

## 2015-12-15 MED ORDER — INSULIN DETEMIR 100 UNIT/ML ~~LOC~~ SOLN
8.0000 [IU] | Freq: Every day | SUBCUTANEOUS | Status: DC
  Administered 2015-12-15: 8 [IU] via SUBCUTANEOUS
  Filled 2015-12-15: qty 0.08

## 2015-12-15 MED ORDER — LORAZEPAM 0.5 MG PO TABS: 0.5000 mg | ORAL_TABLET | Freq: Every day | ORAL | Status: DC

## 2015-12-15 MED ORDER — INSULIN ASPART 100 UNIT/ML ~~LOC~~ SOLN
0.0000 [IU] | Freq: Three times a day (TID) | SUBCUTANEOUS | Status: DC
  Administered 2015-12-15: 7 [IU] via SUBCUTANEOUS

## 2015-12-15 MED ORDER — INSULIN ASPART 100 UNIT/ML ~~LOC~~ SOLN: SUBCUTANEOUS | Status: AC

## 2015-12-15 MED ORDER — INSULIN ASPART 100 UNIT/ML ~~LOC~~ SOLN
12.0000 [IU] | Freq: Once | SUBCUTANEOUS | Status: AC
  Administered 2015-12-15: 12 [IU] via SUBCUTANEOUS

## 2015-12-15 NOTE — Discharge Summary (Signed)
Physician Discharge Summary  Beth Payne:[id number] DOB: 09/15/1940 DOA: 12/13/2015  PCP: [name], MD  Admit date: 12/13/2015 Discharge date: 12/15/2015  Admitted From: SNF Disposition:  SNF  Recommendations for Outpatient Follow-up:  1. Follow up with PCP in 1-2 weeks 2. Please obtain BMP 3. Please check blood glucoses before meals and at bedtime and keep log with which to take to PCP, endocriologist--Dr. Evlyn Kanner   Discharge Condition: stable CODE STATUS: DNR Diet recommendation:  Carb Modified    Brief/Interim Summary: 75 year old female with a history of type 1 diabetes mellitus, dementia, hypertension, gastroparesis, hypothyroidism, and hyperlipidemia presented from [facility] with one-day history of nausea and vomiting with elevated blood sugars. The patient states that she has been compliant with her insulin regimen and has not refused any insulin. She denies any recent fevers, chills, chest pain, short of breath, diarrhea, headache, rashes. She states that her family frequently brings her food from outside the nursing facility, but she states that she has not been "cheating" on her diet. The patient has had numerous admissions for DKA including her most recent admission from 11/20/2015 through 11/24/2015 after which she was discharged with Lantus 8 units daily.  Discharge Diagnoses:  DKA in T1DM:  -patient started on IV insulin with q 1 hour CBG check and q 4 hour BMPs -pt started on aggressive fluid resuscitation -Electrolytes were monitored and repleted -transitioned to [facility] Lantus on 7/9 -d/c home with levemir 8 units in am, 4 in pm -advance diet--pt tolerated well -11/21/15 HbA1C--10.3 -personally reviewed EKG--sinus RBBB, LPFB  Dementia with behavioral disturbance:  -Continue donepezil and sertraline and Ativan -Restarted aripiprazole and lamotrigine  Normocytic anemia:  Stable -Continue iron  AKI:  In setting of DKA, pre-renal  injury -resolved -baseline creatinine 0.5-0.8 -serum creatinine peaked at 1.29  Sacral Pressure ulcer-stage 1-2  -appreciate [facility]OCN consult -no signs of infection on exam -Buttocks and sacrum with 10cm x 16cm area of blanching erythema with maceration and epidermal peeling. Healed area (scar) on left posterior heel. -Urinary incontinence care is timely and house products are used to cleanse and provide a moisture barrier. I have added bilateral pressure redistribution heel boots and guidance on HOB elevation to reduce shear forces in the area with MASD  Severe PCM:  -Continue supplements  Transaminitis:  -appears chronic/intermittent -intermittent worsening due to acute medical illness -RUQ ultrasound--neg -hep c antibody--pending -hep b surface antigen--pending -monitor closely on statin  HTN:  -Continue amlodipine and prazosin with hold parameters -Continue statin  Hypothyroidism: -Continue levothyroxine  Gastroparesis: -Continue Reglan and PPI  Chronic constipation: -Continue bisacodyl, Miralax  Prolonged QTc: -monitor on tele   Discharge Instructions      Discharge Instructions    Diet - low sodium heart healthy    Complete by:  As directed      Increase activity slowly    Complete by:  As directed             Medication List    TAKE these medications        amLODipine 10 MG tablet  Commonly known as:  NORVASC  Take 1 tablet (10 mg total) by mouth daily.     ARIPiprazole 5 MG tablet  Commonly known as:  ABILIFY  Take 1 tablet (5 mg total) by mouth every morning.     bisacodyl 5 MG EC tablet  Commonly known as:  DULCOLAX  Take 10 mg by mouth daily as needed for mild constipation or moderate constipation.  cholecalciferol 1000 units tablet  Commonly known as:  VITAMIN D  Take 2,000 Units by mouth daily.     donepezil 10 MG tablet  Commonly known as:  ARICEPT  Take 10 mg by mouth at bedtime.     feeding supplement (ENSURE ENLIVE) Liqd   Take 237 mLs by mouth 3 (three) times daily with meals.     feeding supplement (PRO-STAT SUGAR FREE 64) Liqd  Take 30 mLs by mouth 2 (two) times daily with a meal.     ferrous sulfate 325 (65 FE) MG tablet  Take 325 mg by mouth 2 (two) times daily. 8am, 5pm     insulin aspart 100 UNIT/ML injection  Commonly known as:  novoLOG  Inject at bedtime based upon sugars.  Sugar 0-120--no units; 121-150--1 unit;  151-200--2 units;  201-250--3 units; 251-300--5 units;  301-350--7 units;  351-400--9 units     insulin aspart 100 UNIT/ML injection  Commonly known as:  novoLOG  Inject before each meal based upon sugars.  Sugar 0-200--no units;  201-250--2 units; 251-300--3 units; 301-350--4 units;  351-400--5 units     insulin detemir 100 UNIT/ML injection  Commonly known as:  LEVEMIR  Inject 4-8 Units into the skin 2 (two) times daily. Takes 8 units in am and 4 units in pm     lamoTRIgine 25 MG tablet  Commonly known as:  LAMICTAL  Take 50 mg by mouth at bedtime.     levothyroxine 50 MCG tablet  Commonly known as:  SYNTHROID, LEVOTHROID  Take 50 mcg by mouth daily before breakfast.     LORazepam 0.5 MG tablet  Commonly known as:  ATIVAN  Take 1 tablet (0.5 mg total) by mouth at bedtime. Anxiety     metoCLOPramide 5 MG tablet  Commonly known as:  REGLAN  Take 1 tablet (5 mg total) by mouth 3 (three) times daily before meals.     pantoprazole 40 MG tablet  Commonly known as:  PROTONIX  Take 1 tablet (40 mg total) by mouth 2 (two) times daily.     polyethylene glycol packet  Commonly known as:  MIRALAX / GLYCOLAX  Take 17 g by mouth daily.     prazosin 1 MG capsule  Commonly known as:  MINIPRESS  Take 1 mg by mouth at bedtime.     rosuvastatin 5 MG tablet  Commonly known as:  CRESTOR  Take 5 mg by mouth daily at 6 PM.     sertraline 50 MG tablet  Commonly known as:  ZOLOFT  Take 50 mg by mouth 3 (three) times daily.     traMADol 50 MG tablet  Commonly known as:  ULTRAM   Take 1 tablet (50 mg total) by mouth every 6 (six) hours as needed for moderate pain.        Allergies  Allergen Reactions  . Diclofenac Sodium Swelling and Other (See Comments)    Blurred vision  . Doxycycline Calcium Swelling and Other (See Comments)    Blurred vision  . Calcium-Containing Compounds Other (See Comments)    Calcium listed as an allergy on [facility] 08/10/15  . Codeine Nausea And Vomiting and Other (See Comments)    Dizziness   . Zocor [Simvastatin] Other (See Comments)    Dizziness, weakness     Consultations:  none   Procedures/Studies: Dg Chest 2 View  11/20/2015  CLINICAL DATA:  Sepsis EXAM: CHEST  2 VIEW COMPARISON:  06/20/2015 FINDINGS: The heart size and mediastinal contours are within normal  limits. Both lungs are clear. Lingular scarring is again noted. The visualized skeletal structures show changes of prior vertebral augmentation. IMPRESSION: No active cardiopulmonary disease. Electronically Signed   By: Alcide Clever M.D.   On: 11/20/2015 19:26   Dg Abd 1 View  11/20/2015  CLINICAL DATA:  Abdominal pain. EXAM: ABDOMEN - 1 VIEW COMPARISON:  CT chest abdomen and pelvis 06/20/2015 FINDINGS: Prominent stool-filled rectum. Remainder the colon is mostly decompressed with scattered gas and stool present. No small or large bowel distention. No radiopaque stones. Vascular calcifications. Degenerative changes in the spine and hips. IMPRESSION: Prominent stool in the rectum may indicate impaction. No evidence of bowel obstruction. Electronically Signed   By: Burman Nieves M.D.   On: 11/20/2015 23:56   US Abdomen Limited Ruq  12/15/2015  CLINICAL DATA:  Elevated liver function studies, 1 day of vomiting; history of gastroesophageal reflux, diabetes, and coronary artery disease. EXAM: US ABDOMEN LIMITED - RIGHT UPPER QUADRANT COMPARISON:  Abdominal and pelvic CT scan of June 20, 2015 FINDINGS: Gallbladder: The gallbladder is adequately distended. There is no wall  thickening or pericholecystic fluid. There is a fold near the gallbladder neck. There is a 3 mm diameter non shadowing echogenic focus along the mucosal surface consistent with a polyp. No sludge or shadowing stones are observed. There is no positive sonographic Murphy's sign. Common bile duct: Diameter: 2.8 mm Liver: The hepatic echotexture is normal. There is no focal mass nor ductal dilation. IMPRESSION: No sonographic evidence of acute cholecystitis. No gallstones. There is a 3 mm polyp. The common bile duct and liver are unremarkable. Electronically Signed   By: Domenica Weightman  [place] M.D.   On: 12/15/2015 07:23        Discharge Exam: Filed Vitals:   12/15/15 0817 12/15/15 1225  BP: 127/73 113/48  Pulse: 66 71  Temp: 97.5 F (36.4 C) 98.4 F (36.9 C)  Resp: 27 16   Filed Vitals:   12/14/15 2309 12/15/15 0405 12/15/15 0817 12/15/15 1225  BP: 96/51 135/70 127/73 113/48  Pulse: 74 73 66 71  Temp: 98.5 F (36.9 C) 98.2 F (36.8 C) 97.5 F (36.4 C) 98.4 F (36.9 C)  TempSrc: Oral Oral Oral Oral  Resp: 19 19 27 16   Height:      Weight:      SpO2: 97% 98% 98% 100%    General: Pt is alert, awake, not in acute distress Cardiovascular: RRR, S1/S2 +, no rubs, no gallops Respiratory: CTA bilaterally, no wheezing, no rhonchi Abdominal: Soft, NT, ND, bowel sounds + Extremities: no edema, no cyanosis; sacrum without drainage, mild maceration   The results of significant diagnostics from this hospitalization (including imaging, microbiology, ancillary and laboratory) are listed below for reference.    Significant Diagnostic Studies: Dg Chest 2 View  11/20/2015  CLINICAL DATA:  Sepsis EXAM: CHEST  2 VIEW COMPARISON:  06/20/2015 FINDINGS: The heart size and mediastinal contours are within normal limits. Both lungs are clear. Lingular scarring is again noted. The visualized skeletal structures show changes of prior vertebral augmentation. IMPRESSION: No active cardiopulmonary disease.  Electronically Signed   By: Alcide Clever M.D.   On: 11/20/2015 19:26   Dg Abd 1 View  11/20/2015  CLINICAL DATA:  Abdominal pain. EXAM: ABDOMEN - 1 VIEW COMPARISON:  CT chest abdomen and pelvis 06/20/2015 FINDINGS: Prominent stool-filled rectum. Remainder the colon is mostly decompressed with scattered gas and stool present. No small or large bowel distention. No radiopaque stones. Vascular calcifications. Degenerative changes in the  spine and hips. IMPRESSION: Prominent stool in the rectum may indicate impaction. No evidence of bowel obstruction. Electronically Signed   By: Burman NievesWilliam  Stevens M.D.   On: 11/20/2015 23:56   [place] Abdomen Limited Ruq  12/15/2015  CLINICAL DATA:  Elevated liver function studies, 1 day of vomiting; history of gastroesophageal reflux, diabetes, and coronary artery disease. EXAM: US ABDOMEN LIMITED - RIGHT UPPER QUADRANT COMPARISON:  Abdominal and pelvic CT scan of June 20, 2015 FINDINGS: Gallbladder: The gallbladder is adequately distended. There is no wall thickening or pericholecystic fluid. There is a fold near the gallbladder neck. There is a 3 mm diameter non shadowing echogenic focus along the mucosal surface consistent with a polyp. No sludge or shadowing stones are observed. There is no positive sonographic Murphy's sign. Common bile duct: Diameter: 2.8 mm Liver: The hepatic echotexture is normal. There is no focal mass nor ductal dilation. IMPRESSION: No sonographic evidence of acute cholecystitis. No gallstones. There is a 3 mm polyp. The common bile duct and liver are unremarkable. Electronically Signed   By: Rida Loudin  [place] M.D.   On: 12/15/2015 07:23     Microbiology: Recent Results (from the past 240 hour(s))  MRSA PCR Screening     Status: Abnormal   Collection Time: 12/14/15  3:44 AM  Result Value Ref Range Status   MRSA by PCR POSITIVE (A) NEGATIVE Final    Comment:        The GeneXpert MRSA Assay (FDA approved for NASAL specimens only), is one component  of a comprehensive MRSA colonization surveillance program. It is not intended to diagnose MRSA infection nor to guide or monitor treatment for MRSA infections. RESULT CALLED TO, READ BACK BY AND VERIFIED WITH: YARBER,L RN [id number] 12/14/15 MITCHELL,L      Labs: Basic Metabolic Panel:  Recent Labs Lab 12/13/15 1935 12/13/15 2353 12/14/15 0344 12/14/15 1058 12/15/15 0404  NA 129* 134* 136  --  134*  K 3.6 4.3 4.2  --  4.2  CL 93* 104 105  --  102  CO2 17* 18* 22  --  26  GLUCOSE 597* 269* 95  --  342*  BUN 32* 25* 17  --  11  CREATININE 1.29* 0.87 0.60  --  0.67  CALCIUM 10.2 9.3 9.5  --  9.4  MG  --   --   --  1.7 1.7  PHOS  --  2.7  --   --   --    Liver Function Tests:  Recent Labs Lab 12/13/15 1935 12/14/15 1058 12/15/15 0404  AST 60*  --  30  ALT 78*  --  50  ALKPHOS 104  --  83  BILITOT 1.3* 0.5 0.7  PROT 6.3*  --  5.4*  ALBUMIN 3.9  --  3.2*   No results for input(s): LIPASE, AMYLASE in the last 168 hours. No results for input(s): AMMONIA in the last 168 hours. CBC:  Recent Labs Lab 12/13/15 1935 12/14/15 1058  WBC 4.9 5.4  HGB 10.6* 9.4*  HCT 33.2* 28.4*  MCV 86.7 84.0  PLT 240 222   Cardiac Enzymes: No results for input(s): CKTOTAL, CKMB, CKMBINDEX, TROPONINI in the last 168 hours. BNP: Invalid input(s): POCBNP CBG:  Recent Labs Lab 12/14/15 1121 12/14/15 1640 12/14/15 2146 12/15/15 0812 12/15/15 1223  GLUCAP 154* 136* 207* 483* 323*    Time coordinating discharge:  Greater than 30 minutes  Signed:  Terrill Alperin, DO [facility] Pager: [phone] 12/15/2015, 1:31 PM

## 2015-12-15 NOTE — NC FL2 (Signed)
MEDICAID FL2 LEVEL OF CARE SCREENING TOOL     IDENTIFICATION  Patient Name: Beth Payne Birthdate: 13-May-1941 Sex: female Admission Date (Current Location): 12/13/2015  [facility] [place] [facility] [place] Number:      Facility and Address:         Provider Number:    Attending Physician Name and Address:  Catarina Hartshorn, MD  Relative Name and Phone Number:       Current Level of Care:   Recommended Level of Care:   Prior Approval Number:    Date Approved/Denied:   PASRR Number:    Discharge Plan:      Current Diagnoses: Patient Active Problem List   Diagnosis Date Noted  . Transaminasemia   . DKA (diabetic ketoacidoses) (HCC) 12/13/2015  . Transaminitis 12/13/2015  . Hypomagnesemia 11/24/2015  . Protein-calorie malnutrition, severe 11/22/2015  . Pressure ulcer, stage I, non-blanching erythema 11/22/2015  . 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 Dementia 11/20/2015  . Abdominal pain 11/20/2015  . Weakness generalized 05/20/2015  . Lung mass 05/20/2015  . Hypokalemia 04/24/2015  . AKI (acute kidney injury) (HCC) 02/23/2015  . Acute encephalopathy 01/01/2015  . DM (diabetes mellitus), type 1 with complications (HCC) 12/17/2014  . CAD in native artery   . Pulmonary hypertension (HCC)   . GERD (gastroesophageal reflux disease) 12/12/2014  . CAD s/p stent to mid-RCA in 2001. 12/12/2014  . Osteoarthritis 12/12/2014  . Systolic murmur 12/12/2014  . Normocytic anemia 12/12/2014  . Dyslipidemia 12/12/2014  . Diabetic ketoacidosis without coma associated with type 1 diabetes mellitus (HCC)     Orientation RESPIRATION BLADDER Height & Weight            Weight: 41.6 kg (91 lb 11.4 oz) Height:   (157.5 cm)  BEHAVIORAL SYMPTOMS/MOOD NEUROLOGICAL BOWEL NUTRITION STATUS           AMBULATORY STATUS COMMUNICATION OF NEEDS Skin                               Personal Care Assistance Level of Assistance              Functional Limitations Info              SPECIAL CARE FACTORS FREQUENCY                       Contractures Contractures Info: Not present    Additional Factors Info    Code Status Info: DNR Allergies Info: Diclofenac Sodium, Doxycycline Calcium, Calcium-containing Compounds, Codeine, Zocor           Current Medications (12/15/2015):  This is the current hospital active medication list Current Facility-Administered Medications  Medication Dose Route Frequency Provider Last Rate Last Dose  . acetaminophen (TYLENOL) tablet 650 mg  650 mg Oral Q6H PRN Alberteen Sam, MD       Or  . acetaminophen (TYLENOL) suppository 650 mg  650 mg Rectal Q6H PRN Alberteen Sam, MD      . amLODipine (NORVASC) tablet 10 mg  10 mg Oral Daily Alberteen Sam, MD   10 mg at 12/15/15 0908  . bisacodyl (DULCOLAX) EC tablet 10 mg  10 mg Oral Daily PRN Alberteen Sam, MD      . Chlorhexidine Gluconate Cloth 2 % PADS 6 each  6 each Topical Q0600 Catarina Hartshorn, MD   6 each at 12/15/15 [phone]  . donepezil (ARICEPT) tablet 10  mg  10 mg Oral QHS Alberteen Samhristopher P Danford, MD   10 mg at 12/14/15 2201  . enoxaparin (LOVENOX) injection 20 mg  20 mg Subcutaneous Q24H Alberteen Samhristopher P Danford, MD   20 mg at 12/15/15 0904  . feeding supplement (ENSURE ENLIVE) (ENSURE ENLIVE) liquid 237 mL  237 mL Oral TID WC Alberteen Samhristopher P Danford, MD   237 mL at 12/15/15 0829  . feeding supplement (PRO-STAT SUGAR FREE 64) liquid 30 mL  30 mL Oral BID WC Alberteen Samhristopher P Danford, MD   30 mL at 12/14/15 1707  . ferrous sulfate tablet 325 mg  325 mg Oral BID WC Alberteen Samhristopher P Danford, MD   325 mg at 12/15/15 0824  . insulin aspart (novoLOG) injection 0-5 Units  0-5 Units Subcutaneous QHS David Tat, MD      . insulin aspart (novoLOG) injection 0-9 Units  0-9 Units Subcutaneous TID WC Catarina Hartshornavid Tat, MD   9 Units at 12/15/15 1300  . insulin detemir (LEVEMIR) injection 4 Units  4 Units Subcutaneous QHS Catarina Hartshornavid Tat, MD      . insulin detemir (LEVEMIR) injection 8 Units  8  Units Subcutaneous Daily Catarina Hartshornavid Tat, MD   8 Units at 12/15/15 [phone]  . lamoTRIgine (LAMICTAL) tablet 50 mg  50 mg Oral QHS Alberteen Samhristopher P Danford, MD   50 mg at 12/14/15 2200  . levothyroxine (SYNTHROID, LEVOTHROID) tablet 50 mcg  50 mcg Oral QAC breakfast Alberteen Samhristopher P Danford, MD   50 mcg at 12/15/15 0824  . LORazepam (ATIVAN) tablet 0.5 mg  0.5 mg Oral QHS Alberteen Samhristopher P Danford, MD   0.5 mg at 12/14/15 2200  . metoCLOPramide (REGLAN) tablet 5 mg  5 mg Oral TID AC Alberteen Samhristopher P Danford, MD   5 mg at 12/15/15 1258  . mupirocin ointment (BACTROBAN) 2 % 1 application  1 application Nasal BID Catarina Hartshornavid Tat, MD   1 application at 12/15/15 [phone]  . pantoprazole (PROTONIX) EC tablet 40 mg  40 mg Oral BID Alberteen Samhristopher P Danford, MD   40 mg at 12/15/15 0903  . polyethylene glycol (MIRALAX / GLYCOLAX) packet 17 g  17 g Oral Daily Alberteen Samhristopher P Danford, MD   17 g at 12/14/15 0957  . prazosin (MINIPRESS) capsule 1 mg  1 mg Oral QHS Alberteen Samhristopher P Danford, MD   1 mg at 12/14/15 2200  . rosuvastatin (CRESTOR) tablet 5 mg  5 mg Oral q1800 Alberteen Samhristopher P Danford, MD   5 mg at 12/14/15 1707  . senna-docusate (Senokot-S) tablet 2 tablet  2 tablet Oral BID Alberteen Samhristopher P Danford, MD   2 tablet at 12/15/15 0903  . sertraline (ZOLOFT) tablet 50 mg  50 mg Oral TID Alberteen Samhristopher P Danford, MD   50 mg at 12/15/15 0904  . sodium chloride flush (NS) 0.9 % injection 3 mL  3 mL Intravenous Q12H Alberteen Samhristopher P Danford, MD   3 mL at 12/15/15 0908  . traMADol (ULTRAM) tablet 50 mg  50 mg Oral Q6H PRN Alberteen Samhristopher P Danford, MD         Discharge Medications: Please see discharge summary for a list of discharge medications.  Relevant Imaging Results:  Relevant Lab Results:   Additional Information SSN:  [id number]  [address] [place], [place]

## 2015-12-15 NOTE — Progress Notes (Addendum)
Initial Nutrition Assessment  DOCUMENTATION CODES:   Severe malnutrition in context of chronic illness, Underweight  INTERVENTION:   -Continue with Ensure Enlive po TID, each supplement provides 350 kcal and 20 grams of protein -Continue MVI daily -D/c Prostat BID  NUTRITION DIAGNOSIS:   Malnutrition related to chronic illness as evidenced by severe depletion of body fat, severe depletion of muscle mass, percent weight loss.  GOAL:   Patient will meet greater than or equal to 90% of their needs  MONITOR:   PO intake, Supplement acceptance, Labs, Weight trends, Skin, I & O's  REASON FOR ASSESSMENT:   Malnutrition Screening Tool    ASSESSMENT:   Beth Payne is a 75 y.o. female with a past medical history significant for dementia, T1DM brittle, pressure ulcer on sacrum and frequent hospitalizations for DKA who presents with DKA.  Pt admitted with DKA. She is a resident of [facility].   Pt reports appetite has been poor "for a long time". She shares that she only ate a few bites of breakfast this morning. She likes Ensure supplements [facility] However, reviewed MAR from SNF- pt receives 30 ml Prostat BID.   Pt confirms weight loss, however, unable to confirm quantity or time frame for weight loss. She reveals UBW around 110#. Per wt hx, pt was experienced a 20.8% wt loss over the past year, which is significant for time frame.   Reviewed [facility] note; pt with MASD on sacrum and previously healed full thickness pressure injuries on lt shoulder and lt lateral knee.   Nutrition-Focused physical exam completed. Findings are moderate to severe fat depletion, moderate to severe muscle depletion, and no edema.   Discussed importance of good nutritional intake for healing. Encouraged pt to continue to consume supplements, especially in light of poor intake.   Case discussed extensively with RN. She confirms pt with poor appetite, but accepting  Ensure supplements well. Pt has been refusing Prostat. Discussed plans to glycemic control; RN confirms that pt is followed by endocrinology, who are adjusting insulin closely.   Labs reviewed: Na: 134, CBGS: 136-207.   Diet Order:  Diet Carb Modified Fluid consistency:: Thin; Room service appropriate?: Yes Diet - low sodium heart healthy  Skin:  Wound (see comment) (MASD on sacrum)  Last BM:  12/15/15  Height:   Ht Readings from Last 1 Encounters:  12/14/15 5\' 2"  (1.575 m)    Weight:   Wt Readings from Last 1 Encounters:  12/14/15 91 lb 11.4 oz (41.6 kg)    Ideal Body Weight:  50 kg  BMI:  Body mass index is 16.77 kg/(m^2).  Estimated Nutritional Needs:   Kcal:  1200-1400  Protein:  55-70 grams  Fluid:  1.2-1.4 L  EDUCATION NEEDS:   Education needs addressed  Beth Payne, RD, LDN, CDE Pager: [phone] After hours Pager: [phone]

## 2015-12-15 NOTE — Evaluation (Signed)
Physical Therapy Evaluation Patient Details Name: Beth Payne MRN: [id number] DOB: 10/14/1940 Today's Date: 12/15/2015   History of Present Illness  75 y.o. 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  Clinical Impression  Patient presents with decreased independence with mobility due to general weakness, poor balance and activity tolerance as well as issues noted below in PT problem list.  She will benefit from skilled PT in the acute setting to allow return home following continued SNF level rehab stay.     Follow Up Recommendations SNF    Equipment Recommendations  None recommended by PT    Recommendations for Other Services       Precautions / Restrictions Precautions Precautions: Fall Precaution Comments: sacral pressure wound  Restrictions Weight Bearing Restrictions: No      Mobility  Bed Mobility Overal bed mobility: Needs Assistance Bed Mobility: Rolling;Sidelying to Sit Rolling: Min assist Sidelying to sit: Min assist       General bed mobility comments: use of railings, assist for rolling due to urinary incontinence needing help for hygiene and change of linens  Transfers Overall transfer level: Needs assistance Equipment used: Rolling walker (2 wheeled) Transfers: Sit to/from Stand Sit to Stand: Min assist         General transfer comment: refused to stand until I assisted to don brief.  Patient able to stand with min A for anterior weight shift and use of walker for balance, stood twice; deferred OOB to chair due to no geomat in room; nursing secretary made aware and ordered for pt  Ambulation/Gait   Ambulation Distance (Feet): 2 Feet Assistive device: Rolling walker (2 wheeled) Gait Pattern/deviations: Step-to pattern;Narrow base of support     General Gait Details: two steps forward and back, then second bout side stepping up to  [facility]  Stairs            Wheelchair Mobility    Modified Rankin (Stroke Patients Only)       Balance Overall balance assessment: Needs assistance Sitting-balance support: Feet supported;Feet unsupported Sitting balance-Leahy Scale: Good Sitting balance - Comments: able to shift in sitting for scooting forward and back with UE support no LOB, static sitting no UE support   Standing balance support: Bilateral upper extremity supported Standing balance-Leahy Scale: Poor Standing balance comment: balances in standing UE support and min A x at least 30 sec                             Pertinent Vitals/Pain Faces Pain Scale: Hurts little more Pain Location: R finger with pulling from O2 sensor Pain Descriptors / Indicators: Grimacing;Discomfort Pain Intervention(s): Monitored during session;Repositioned    Home Living Family/patient expects to be discharged to:: Skilled nursing facility                      Prior Function Level of Independence: Needs assistance   Gait / Transfers Assistance Needed: reports getting around [facility]; states she has been working with PT on sitting and standing balance and the arm bike  ADL's / Homemaking Assistance Needed: pt reports staff assists with all bathing and dressing        Hand Dominance   Dominant Hand: Right    Extremity/Trunk Assessment               Lower  Extremity Assessment: RLE deficits/detail;LLE deficits/detail RLE Deficits / Details: AROM grossly WFL, strength 3+ overall LLE Deficits / Details: AROM grossly WFL, strength 3+ overall  Cervical / Trunk Assessment: Kyphotic  Communication   Communication: No difficulties  Cognition Arousal/Alertness: Awake/alert Behavior During Therapy: WFL for tasks assessed/performed Overall Cognitive Status: History of cognitive impairments - at baseline       Memory: Decreased short-term memory              General Comments       Exercises        Assessment/Plan    PT Assessment Patient needs continued PT services  PT Diagnosis Generalized weakness;Difficulty walking   PT Problem List Decreased strength;Decreased activity tolerance;Decreased balance;Decreased mobility;Decreased knowledge of use of DME  PT Treatment Interventions DME instruction;Gait training;Balance training;Functional mobility training;Patient/family education;Therapeutic activities;Therapeutic exercise;Wheelchair mobility training   PT Goals (Current goals can be found in the Care Plan section) Acute Rehab PT Goals Patient Stated Goal: to resume PT at SNF PT Goal Formulation: With patient Time For Goal Achievement: 12/29/15 Potential to Achieve Goals: Fair    Frequency Min 2X/week   Barriers to discharge        Co-evaluation               End of Session Equipment Utilized During Treatment: Gait belt Activity Tolerance: Patient limited by fatigue Patient left: in bed;with call bell/phone within reach;with bed alarm set           Time: 1100-1133 PT Time Calculation (min) (ACUTE ONLY): 33 min   Charges:   PT Evaluation $PT Eval Moderate Complexity: 1 Procedure PT Treatments $Therapeutic Activity: 8-22 mins   PT G CodesElray Mcgregor 12-20-15, 11:48 AM  Sheran Lawless, PT [phone] 20-Dec-2015

## 2015-12-15 NOTE — Progress Notes (Signed)
Report called to Sherre Lainawana Norman, LPN @ [facility]. Transferred out via stretcher by PTAR.

## 2015-12-15 NOTE — Clinical Social Work Note (Signed)
Clinical Social Work Assessment  Patient Details  Name: Beth Payne MRN: [id number] Date of Birth: 04/23/1941  Date of referral:  12/15/15               Reason for consult:  Facility Placement                Permission sought to share information with:  Facility Medical sales representativeContact Representative, Family Supports Permission granted to share information::  Yes, Verbal Permission Granted  Name::     Beth Payne  Agency::  [facility]  Relationship::  ComptrollerLegal Guardian  Contact Information:     Housing/Transportation Living arrangements for the past 2 months:  Skilled Building surveyorursing Facility Source of Information:  Guardian Patient Interpreter Needed:  None Criminal Activity/Legal Involvement Pertinent to Current Situation/Hospitalization:  No - Comment as needed Significant Relationships:  Other Family Members, Friend Lives with:  Facility Resident Do you feel safe going back to the place where you live?  Yes Need for family participation in patient care:  Yes (Comment)  Care giving concerns:  CSW received consult regarding discharge planning. Patient is disoriented. CSW [place], Beth [place]. He stated patient is from [facility] long term care and will return there at discharge.    Social Worker assessment / plan:  Patient will discharge back to [facility] when ready to discharge.  Employment status:  Retired Health and safety inspectornsurance information:  Medicare PT Recommendations:  Skilled Nursing Facility Information / Referral to community resources:  Skilled Nursing Facility  Patient/Family's Response to care:  Beth LeePatient's HCPOA stated patient [facility] is [facility] bed [facility]) by PTAR.  Patient/Family's Understanding of and Emotional Response to Diagnosis, Current Treatment, and Prognosis:  No questions/concerns.  Emotional Assessment Appearance:  Appears stated age Attitude/Demeanor/Rapport:  Unable to Assess Affect (typically observed):  Unable to Assess Orientation:   Oriented to Self Alcohol / Substance use:  Not Applicable Psych involvement (Current and /or in the community):  No (Comment)  Discharge Needs  Concerns to be addressed:  Care Coordination Readmission within the last 30 days:  Yes Current discharge risk:  None Barriers to Discharge:  No Barriers Identified   Beth Payne, LCSWA 12/15/2015, 1:55 PM

## 2015-12-15 NOTE — Progress Notes (Signed)
Patient will DC to: [facility] Anticipated DC date: 12/15/15 Family notified: [name] Transport by: Audie ClearPTAR 4pm   Per MD patient ready for DC back to [facility]. RN, patient, patient's family, and facility notified of DC. RN given number for report. DC packet on chart. Ambulance transport requested for patient.   CSW signing off.  Cristobal GoldmannNadia Mikkel Charrette, [place]CSWA Clinical Social Worker [phone]

## 2015-12-16 DIAGNOSIS — R627 Adult failure to thrive: Secondary | ICD-10-CM | POA: Diagnosis not present

## 2015-12-16 DIAGNOSIS — E1010 Type 1 diabetes mellitus with ketoacidosis without coma: Secondary | ICD-10-CM | POA: Diagnosis not present

## 2015-12-16 DIAGNOSIS — D6489 Other specified anemias: Secondary | ICD-10-CM | POA: Diagnosis not present

## 2015-12-16 DIAGNOSIS — E784 Other hyperlipidemia: Secondary | ICD-10-CM | POA: Diagnosis not present

## 2015-12-16 DIAGNOSIS — G9349 Other encephalopathy: Secondary | ICD-10-CM | POA: Diagnosis not present

## 2015-12-16 DIAGNOSIS — E871 Hypo-osmolality and hyponatremia: Secondary | ICD-10-CM | POA: Diagnosis not present

## 2015-12-16 DIAGNOSIS — F0390 Unspecified dementia without behavioral disturbance: Secondary | ICD-10-CM | POA: Diagnosis not present

## 2015-12-16 DIAGNOSIS — K3184 Gastroparesis: Secondary | ICD-10-CM | POA: Diagnosis not present

## 2015-12-16 DIAGNOSIS — F331 Major depressive disorder, recurrent, moderate: Secondary | ICD-10-CM | POA: Diagnosis not present

## 2015-12-16 DIAGNOSIS — N179 Acute kidney failure, unspecified: Secondary | ICD-10-CM | POA: Diagnosis not present

## 2015-12-16 DIAGNOSIS — I10 Essential (primary) hypertension: Secondary | ICD-10-CM | POA: Diagnosis not present

## 2015-12-16 DIAGNOSIS — M6281 Muscle weakness (generalized): Secondary | ICD-10-CM | POA: Diagnosis not present

## 2015-12-16 LAB — HEPATITIS B SURFACE ANTIGEN: HEP B S AG: NEGATIVE

## 2015-12-16 LAB — HEPATITIS C ANTIBODY

## 2016-01-19 ENCOUNTER — Inpatient Hospital Stay (HOSPITAL_COMMUNITY)
Admission: EM | Admit: 2016-01-19 | Discharge: 2016-01-23 | DRG: 637 | Disposition: A | Discharge status: Skilled Nursing Facility | Payer: Medicare Other | Attending: Internal Medicine | Admitting: Internal Medicine

## 2016-01-19 ENCOUNTER — Encounter (HOSPITAL_COMMUNITY): Payer: Self-pay | Admitting: *Deleted

## 2016-01-19 DIAGNOSIS — E1029 Type 1 diabetes mellitus with other diabetic kidney complication: Secondary | ICD-10-CM | POA: Diagnosis not present

## 2016-01-19 DIAGNOSIS — L89152 Pressure ulcer of sacral region, stage 2: Secondary | ICD-10-CM | POA: Diagnosis not present

## 2016-01-19 DIAGNOSIS — Z981 Arthrodesis status: Secondary | ICD-10-CM

## 2016-01-19 DIAGNOSIS — E876 Hypokalemia: Secondary | ICD-10-CM | POA: Diagnosis present

## 2016-01-19 DIAGNOSIS — E785 Hyperlipidemia, unspecified: Secondary | ICD-10-CM | POA: Diagnosis present

## 2016-01-19 DIAGNOSIS — Z794 Long term (current) use of insulin: Secondary | ICD-10-CM

## 2016-01-19 DIAGNOSIS — I252 Old myocardial infarction: Secondary | ICD-10-CM

## 2016-01-19 DIAGNOSIS — M797 Fibromyalgia: Secondary | ICD-10-CM | POA: Diagnosis present

## 2016-01-19 DIAGNOSIS — Z681 Body mass index (BMI) 19 or less, adult: Secondary | ICD-10-CM

## 2016-01-19 DIAGNOSIS — Z8673 Personal history of transient ischemic attack (TIA), and cerebral infarction without residual deficits: Secondary | ICD-10-CM | POA: Diagnosis not present

## 2016-01-19 DIAGNOSIS — F419 Anxiety disorder, unspecified: Secondary | ICD-10-CM | POA: Diagnosis present

## 2016-01-19 DIAGNOSIS — E038 Other specified hypothyroidism: Secondary | ICD-10-CM | POA: Diagnosis not present

## 2016-01-19 DIAGNOSIS — K219 Gastro-esophageal reflux disease without esophagitis: Secondary | ICD-10-CM | POA: Diagnosis present

## 2016-01-19 DIAGNOSIS — N179 Acute kidney failure, unspecified: Secondary | ICD-10-CM | POA: Diagnosis present

## 2016-01-19 DIAGNOSIS — E108 Type 1 diabetes mellitus with unspecified complications: Secondary | ICD-10-CM | POA: Diagnosis not present

## 2016-01-19 DIAGNOSIS — E1110 Type 2 diabetes mellitus with ketoacidosis without coma: Secondary | ICD-10-CM | POA: Diagnosis present

## 2016-01-19 DIAGNOSIS — E10319 Type 1 diabetes mellitus with unspecified diabetic retinopathy without macular edema: Secondary | ICD-10-CM | POA: Diagnosis present

## 2016-01-19 DIAGNOSIS — I2510 Atherosclerotic heart disease of native coronary artery without angina pectoris: Secondary | ICD-10-CM | POA: Diagnosis present

## 2016-01-19 DIAGNOSIS — R1084 Generalized abdominal pain: Secondary | ICD-10-CM | POA: Diagnosis not present

## 2016-01-19 DIAGNOSIS — R531 Weakness: Secondary | ICD-10-CM | POA: Diagnosis not present

## 2016-01-19 DIAGNOSIS — E43 Unspecified severe protein-calorie malnutrition: Secondary | ICD-10-CM | POA: Diagnosis present

## 2016-01-19 DIAGNOSIS — F0391 Unspecified dementia with behavioral disturbance: Secondary | ICD-10-CM | POA: Diagnosis not present

## 2016-01-19 DIAGNOSIS — I10 Essential (primary) hypertension: Secondary | ICD-10-CM | POA: Diagnosis present

## 2016-01-19 DIAGNOSIS — E441 Mild protein-calorie malnutrition: Secondary | ICD-10-CM | POA: Diagnosis not present

## 2016-01-19 DIAGNOSIS — Z87891 Personal history of nicotine dependence: Secondary | ICD-10-CM

## 2016-01-19 DIAGNOSIS — D649 Anemia, unspecified: Secondary | ICD-10-CM | POA: Diagnosis not present

## 2016-01-19 DIAGNOSIS — Z66 Do not resuscitate: Secondary | ICD-10-CM | POA: Diagnosis present

## 2016-01-19 DIAGNOSIS — E1143 Type 2 diabetes mellitus with diabetic autonomic (poly)neuropathy: Secondary | ICD-10-CM | POA: Diagnosis not present

## 2016-01-19 DIAGNOSIS — F411 Generalized anxiety disorder: Secondary | ICD-10-CM

## 2016-01-19 DIAGNOSIS — E784 Other hyperlipidemia: Secondary | ICD-10-CM | POA: Diagnosis not present

## 2016-01-19 DIAGNOSIS — E871 Hypo-osmolality and hyponatremia: Secondary | ICD-10-CM | POA: Diagnosis not present

## 2016-01-19 DIAGNOSIS — M8580 Other specified disorders of bone density and structure, unspecified site: Secondary | ICD-10-CM | POA: Diagnosis present

## 2016-01-19 DIAGNOSIS — G894 Chronic pain syndrome: Secondary | ICD-10-CM | POA: Diagnosis present

## 2016-01-19 DIAGNOSIS — F32A Depression, unspecified: Secondary | ICD-10-CM

## 2016-01-19 DIAGNOSIS — Z955 Presence of coronary angioplasty implant and graft: Secondary | ICD-10-CM | POA: Diagnosis not present

## 2016-01-19 DIAGNOSIS — E1043 Type 1 diabetes mellitus with diabetic autonomic (poly)neuropathy: Secondary | ICD-10-CM | POA: Diagnosis present

## 2016-01-19 DIAGNOSIS — K3184 Gastroparesis: Secondary | ICD-10-CM | POA: Diagnosis present

## 2016-01-19 DIAGNOSIS — E039 Hypothyroidism, unspecified: Secondary | ICD-10-CM | POA: Diagnosis present

## 2016-01-19 DIAGNOSIS — F0390 Unspecified dementia without behavioral disturbance: Secondary | ICD-10-CM | POA: Diagnosis not present

## 2016-01-19 DIAGNOSIS — R739 Hyperglycemia, unspecified: Secondary | ICD-10-CM | POA: Diagnosis not present

## 2016-01-19 DIAGNOSIS — E1010 Type 1 diabetes mellitus with ketoacidosis without coma: Secondary | ICD-10-CM | POA: Diagnosis not present

## 2016-01-19 DIAGNOSIS — F329 Major depressive disorder, single episode, unspecified: Secondary | ICD-10-CM | POA: Diagnosis present

## 2016-01-19 DIAGNOSIS — E1039 Type 1 diabetes mellitus with other diabetic ophthalmic complication: Secondary | ICD-10-CM | POA: Diagnosis not present

## 2016-01-19 DIAGNOSIS — G9349 Other encephalopathy: Secondary | ICD-10-CM | POA: Diagnosis not present

## 2016-01-19 DIAGNOSIS — R7309 Other abnormal glucose: Secondary | ICD-10-CM | POA: Diagnosis not present

## 2016-01-19 LAB — COMPREHENSIVE METABOLIC PANEL
ALBUMIN: 4.7 g/dL (ref 3.5–5.0)
ALK PHOS: 107 U/L (ref 38–126)
ALT: 56 U/L — AB (ref 14–54)
AST: 35 U/L (ref 15–41)
Anion gap: 22 — ABNORMAL HIGH (ref 5–15)
BILIRUBIN TOTAL: 1.6 mg/dL — AB (ref 0.3–1.2)
BUN: 21 mg/dL — AB (ref 6–20)
CO2: 13 mmol/L — ABNORMAL LOW (ref 22–32)
CREATININE: 1.25 mg/dL — AB (ref 0.44–1.00)
Calcium: 10.4 mg/dL — ABNORMAL HIGH (ref 8.9–10.3)
Chloride: 99 mmol/L — ABNORMAL LOW (ref 101–111)
GFR calc Af Amer: 48 mL/min — ABNORMAL LOW (ref >60)
GFR, EST NON AFRICAN AMERICAN: 41 mL/min — AB (ref >60)
GLUCOSE: 603 mg/dL — AB (ref 65–99)
POTASSIUM: 4.4 mmol/L (ref 3.5–5.1)
Sodium: 134 mmol/L — ABNORMAL LOW (ref 135–145)
TOTAL PROTEIN: 7.3 g/dL (ref 6.5–8.1)

## 2016-01-19 LAB — BASIC METABOLIC PANEL
ANION GAP: 22 — AB (ref 5–15)
ANION GAP: 8 (ref 5–15)
Anion gap: 9 (ref 5–15)
BUN: 22 mg/dL — ABNORMAL HIGH (ref 6–20)
BUN: 19 mg/dL (ref 6–20)
BUN: 20 mg/dL (ref 6–20)
CALCIUM: 10.5 mg/dL — AB (ref 8.9–10.3)
CALCIUM: 10.3 mg/dL (ref 8.9–10.3)
CHLORIDE: 102 mmol/L (ref 101–111)
CHLORIDE: 105 mmol/L (ref 101–111)
CHLORIDE: 105 mmol/L (ref 101–111)
CO2: 13 mmol/L — AB (ref 22–32)
CO2: 22 mmol/L (ref 22–32)
CO2: 25 mmol/L (ref 22–32)
CREATININE: 0.72 mg/dL (ref 0.44–1.00)
Calcium: 10.6 mg/dL — ABNORMAL HIGH (ref 8.9–10.3)
Creatinine, Ser: 1.27 mg/dL — ABNORMAL HIGH (ref 0.44–1.00)
Creatinine, Ser: 0.76 mg/dL (ref 0.44–1.00)
GFR calc Af Amer: >60 mL/min (ref >60)
GFR calc non Af Amer: 41 mL/min — ABNORMAL LOW (ref >60)
GFR calc non Af Amer: >60 mL/min (ref >60)
GFR calc non Af Amer: >60 mL/min (ref >60)
GFR, EST AFRICAN AMERICAN: 47 mL/min — AB (ref >60)
GLUCOSE: 138 mg/dL — AB (ref 65–99)
Glucose, Bld: 461 mg/dL — ABNORMAL HIGH (ref 65–99)
Glucose, Bld: 167 mg/dL — ABNORMAL HIGH (ref 65–99)
Potassium: 4 mmol/L (ref 3.5–5.1)
Potassium: 3.7 mmol/L (ref 3.5–5.1)
Potassium: 3.5 mmol/L (ref 3.5–5.1)
SODIUM: 139 mmol/L (ref 135–145)
Sodium: 137 mmol/L (ref 135–145)
Sodium: 135 mmol/L (ref 135–145)

## 2016-01-19 LAB — MAGNESIUM: MAGNESIUM: 1.9 mg/dL (ref 1.7–2.4)

## 2016-01-19 LAB — CBC WITH DIFFERENTIAL/PLATELET
BASOS PCT: 0 %
Basophils Absolute: 0 10*3/uL (ref 0.0–0.1)
Eosinophils Absolute: 0 10*3/uL (ref 0.0–0.7)
Eosinophils Relative: 0 %
HEMATOCRIT: 36.9 % (ref 36.0–46.0)
HEMOGLOBIN: 11.7 g/dL — AB (ref 12.0–15.0)
LYMPHS PCT: 15 %
Lymphs Abs: 1.1 10*3/uL (ref 0.7–4.0)
MCH: 28.4 pg (ref 26.0–34.0)
MCHC: 31.7 g/dL (ref 30.0–36.0)
MCV: 89.6 fL (ref 78.0–100.0)
MONO ABS: 0.2 10*3/uL (ref 0.1–1.0)
Monocytes Relative: 3 %
Neutro Abs: 6 10*3/uL (ref 1.7–7.7)
Neutrophils Relative %: 82 %
Platelets: 208 10*3/uL (ref 150–400)
RBC: 4.12 MIL/uL (ref 3.87–5.11)
RDW: 14.4 % (ref 11.5–15.5)
WBC: 7.3 10*3/uL (ref 4.0–10.5)

## 2016-01-19 LAB — GLUCOSE, CAPILLARY
GLUCOSE-CAPILLARY: 127 mg/dL — AB (ref 65–99)
GLUCOSE-CAPILLARY: 164 mg/dL — AB (ref 65–99)
GLUCOSE-CAPILLARY: 189 mg/dL — AB (ref 65–99)
GLUCOSE-CAPILLARY: 74 mg/dL (ref 65–99)
GLUCOSE-CAPILLARY: 84 mg/dL (ref 65–99)
Glucose-Capillary: 167 mg/dL — ABNORMAL HIGH (ref 65–99)
Glucose-Capillary: 157 mg/dL — ABNORMAL HIGH (ref 65–99)
Glucose-Capillary: 162 mg/dL — ABNORMAL HIGH (ref 65–99)

## 2016-01-19 LAB — CBC
HCT: 36.4 % (ref 36.0–46.0)
HEMOGLOBIN: 11.6 g/dL — AB (ref 12.0–15.0)
MCH: 28.5 pg (ref 26.0–34.0)
MCHC: 31.9 g/dL (ref 30.0–36.0)
MCV: 89.4 fL (ref 78.0–100.0)
Platelets: 224 10*3/uL (ref 150–400)
RBC: 4.07 MIL/uL (ref 3.87–5.11)
RDW: 14.4 % (ref 11.5–15.5)
WBC: 14.2 10*3/uL — ABNORMAL HIGH (ref 4.0–10.5)

## 2016-01-19 LAB — MRSA PCR SCREENING: MRSA by PCR: NEGATIVE

## 2016-01-19 LAB — CBG MONITORING, ED
GLUCOSE-CAPILLARY: 490 mg/dL — AB (ref 65–99)
GLUCOSE-CAPILLARY: 394 mg/dL — AB (ref 65–99)
GLUCOSE-CAPILLARY: 319 mg/dL — AB (ref 65–99)
GLUCOSE-CAPILLARY: 255 mg/dL — AB (ref 65–99)
Glucose-Capillary: 552 mg/dL (ref 65–99)

## 2016-01-19 LAB — I-STAT VENOUS BLOOD GAS, ED
ACID-BASE DEFICIT: 15 mmol/L — AB (ref 0.0–2.0)
BICARBONATE: 11.1 meq/L — AB (ref 20.0–24.0)
O2 Saturation: 86 %
PCO2 VEN: 26.1 mmHg — AB (ref 45.0–50.0)
PO2 VEN: 60 mmHg — AB (ref 31.0–45.0)
TCO2: 12 mmol/L (ref 0–100)
pH, Ven: 7.236 — ABNORMAL LOW (ref 7.250–7.300)

## 2016-01-19 LAB — I-STAT TROPONIN, ED: Troponin i, poc: 0 ng/mL (ref 0.00–0.08)

## 2016-01-19 MED ORDER — INSULIN REGULAR HUMAN 100 UNIT/ML IJ SOLN
INTRAMUSCULAR | Status: AC
  Administered 2016-01-19: 8.6 [IU]/h via INTRAVENOUS

## 2016-01-19 MED ORDER — PRO-STAT SUGAR FREE PO LIQD
30.0000 mL | Freq: Two times a day (BID) | ORAL | Status: DC
  Administered 2016-01-19 – 2016-01-20 (×2): 30 mL via ORAL
  Filled 2016-01-19 (×5): qty 30

## 2016-01-19 MED ORDER — INSULIN DETEMIR 100 UNIT/ML ~~LOC~~ SOLN
6.0000 [IU] | Freq: Every day | SUBCUTANEOUS | Status: DC
  Administered 2016-01-20: 6 [IU] via SUBCUTANEOUS
  Filled 2016-01-19 (×2): qty 0.06

## 2016-01-19 MED ORDER — LORAZEPAM 1 MG PO TABS
0.5000 mg | ORAL_TABLET | Freq: Every day | ORAL | Status: DC
  Administered 2016-01-19 – 2016-01-22 (×4): 0.5 mg via ORAL
  Filled 2016-01-19 (×4): qty 1

## 2016-01-19 MED ORDER — ROSUVASTATIN CALCIUM 5 MG PO TABS
5.0000 mg | ORAL_TABLET | Freq: Every day | ORAL | Status: DC
  Administered 2016-01-19 – 2016-01-22 (×4): 5 mg via ORAL
  Filled 2016-01-19 (×5): qty 1

## 2016-01-19 MED ORDER — PANTOPRAZOLE SODIUM 40 MG PO TBEC
40.0000 mg | DELAYED_RELEASE_TABLET | Freq: Two times a day (BID) | ORAL | Status: DC
  Administered 2016-01-20 – 2016-01-23 (×7): 40 mg via ORAL
  Filled 2016-01-19 (×7): qty 1

## 2016-01-19 MED ORDER — INSULIN DETEMIR 100 UNIT/ML ~~LOC~~ SOLN
4.0000 [IU] | SUBCUTANEOUS | Status: AC
  Administered 2016-01-19: 4 [IU] via SUBCUTANEOUS
  Filled 2016-01-19: qty 0.04

## 2016-01-19 MED ORDER — DEXTROSE-NACL 5-0.45 % IV SOLN
INTRAVENOUS | Status: DC
  Administered 2016-01-19: 17:00:00 via INTRAVENOUS

## 2016-01-19 MED ORDER — SODIUM CHLORIDE 0.9 % IV SOLN
INTRAVENOUS | Status: AC
  Administered 2016-01-19: 13:00:00 via INTRAVENOUS

## 2016-01-19 MED ORDER — DONEPEZIL HCL 5 MG PO TABS
10.0000 mg | ORAL_TABLET | Freq: Every day | ORAL | Status: DC
  Administered 2016-01-19 – 2016-01-22 (×4): 10 mg via ORAL
  Filled 2016-01-19 (×4): qty 2

## 2016-01-19 MED ORDER — ONDANSETRON HCL 4 MG/2ML IJ SOLN
4.0000 mg | Freq: Once | INTRAMUSCULAR | Status: AC
  Administered 2016-01-19: 4 mg via INTRAVENOUS
  Filled 2016-01-19: qty 2

## 2016-01-19 MED ORDER — LAMOTRIGINE 25 MG PO TABS
50.0000 mg | ORAL_TABLET | Freq: Every day | ORAL | Status: DC
  Administered 2016-01-19 – 2016-01-22 (×4): 50 mg via ORAL
  Filled 2016-01-19 (×4): qty 2

## 2016-01-19 MED ORDER — METOCLOPRAMIDE HCL 5 MG/ML IJ SOLN
10.0000 mg | Freq: Three times a day (TID) | INTRAMUSCULAR | Status: DC
  Administered 2016-01-19 – 2016-01-23 (×12): 10 mg via INTRAVENOUS
  Filled 2016-01-19 (×12): qty 2

## 2016-01-19 MED ORDER — DEXTROSE-NACL 5-0.45 % IV SOLN: INTRAVENOUS | Status: DC

## 2016-01-19 MED ORDER — ONDANSETRON HCL 4 MG/2ML IJ SOLN: 4.0000 mg | Freq: Four times a day (QID) | INTRAMUSCULAR | Status: DC | PRN

## 2016-01-19 MED ORDER — ONDANSETRON HCL 4 MG/2ML IJ SOLN
INTRAMUSCULAR | Status: AC
  Administered 2016-01-19: 4 mg
  Filled 2016-01-19: qty 2

## 2016-01-19 MED ORDER — TRAMADOL HCL 50 MG PO TABS: 50.0000 mg | ORAL_TABLET | Freq: Four times a day (QID) | ORAL | Status: DC | PRN

## 2016-01-19 MED ORDER — INSULIN DETEMIR 100 UNIT/ML ~~LOC~~ SOLN
4.0000 [IU] | Freq: Every day | SUBCUTANEOUS | Status: AC
  Administered 2016-01-19: 4 [IU] via SUBCUTANEOUS
  Filled 2016-01-19: qty 0.04

## 2016-01-19 MED ORDER — SODIUM CHLORIDE 0.9% FLUSH
3.0000 mL | Freq: Two times a day (BID) | INTRAVENOUS | Status: DC
  Administered 2016-01-20 – 2016-01-23 (×6): 3 mL via INTRAVENOUS

## 2016-01-19 MED ORDER — HEPARIN SODIUM (PORCINE) 5000 UNIT/ML IJ SOLN
5000.0000 [IU] | Freq: Three times a day (TID) | INTRAMUSCULAR | Status: DC
  Administered 2016-01-20 – 2016-01-21 (×6): 5000 [IU] via SUBCUTANEOUS
  Filled 2016-01-19 (×8): qty 1

## 2016-01-19 MED ORDER — PRAZOSIN HCL 1 MG PO CAPS
1.0000 mg | ORAL_CAPSULE | Freq: Every day | ORAL | Status: DC
  Administered 2016-01-20 – 2016-01-22 (×3): 1 mg via ORAL
  Filled 2016-01-19 (×3): qty 1

## 2016-01-19 MED ORDER — POTASSIUM CHLORIDE 10 MEQ/100ML IV SOLN: 10.0000 meq | INTRAVENOUS | Status: DC

## 2016-01-19 MED ORDER — ONDANSETRON HCL 4 MG PO TABS: 4.0000 mg | ORAL_TABLET | Freq: Four times a day (QID) | ORAL | Status: DC | PRN

## 2016-01-19 MED ORDER — SERTRALINE HCL 50 MG PO TABS
50.0000 mg | ORAL_TABLET | Freq: Three times a day (TID) | ORAL | Status: DC
  Administered 2016-01-19 – 2016-01-23 (×12): 50 mg via ORAL
  Filled 2016-01-19 (×2): qty 1
  Filled 2016-01-19: qty 2
  Filled 2016-01-19: qty 1
  Filled 2016-01-19: qty 2
  Filled 2016-01-19 (×2): qty 1
  Filled 2016-01-19: qty 2
  Filled 2016-01-19 (×4): qty 1

## 2016-01-19 MED ORDER — PANTOPRAZOLE SODIUM 40 MG PO TBEC: 40.0000 mg | DELAYED_RELEASE_TABLET | Freq: Two times a day (BID) | ORAL | Status: DC

## 2016-01-19 MED ORDER — SODIUM CHLORIDE 0.9 % IV SOLN
INTRAVENOUS | Status: DC
Start: 2016-01-19 — End: 2016-01-19

## 2016-01-19 MED ORDER — INSULIN DETEMIR 100 UNIT/ML ~~LOC~~ SOLN
8.0000 [IU] | Freq: Every day | SUBCUTANEOUS | Status: DC
  Filled 2016-01-19 (×2): qty 0.08

## 2016-01-19 MED ORDER — INSULIN ASPART 100 UNIT/ML ~~LOC~~ SOLN
0.0000 [IU] | Freq: Three times a day (TID) | SUBCUTANEOUS | Status: DC
  Administered 2016-01-20: 2 [IU] via SUBCUTANEOUS
  Administered 2016-01-20: 9 [IU] via SUBCUTANEOUS
  Administered 2016-01-21: 7 [IU] via SUBCUTANEOUS

## 2016-01-19 MED ORDER — POLYETHYLENE GLYCOL 3350 17 G PO PACK
17.0000 g | PACK | Freq: Every day | ORAL | Status: DC
  Administered 2016-01-20: 17 g via ORAL
  Filled 2016-01-19 (×4): qty 1

## 2016-01-19 MED ORDER — SODIUM CHLORIDE 0.9 % IV BOLUS (SEPSIS)
500.0000 mL | Freq: Once | INTRAVENOUS | Status: AC
  Administered 2016-01-19: 500 mL via INTRAVENOUS

## 2016-01-19 MED ORDER — LEVOTHYROXINE SODIUM 50 MCG PO TABS
50.0000 ug | ORAL_TABLET | Freq: Every day | ORAL | Status: DC
  Administered 2016-01-20 – 2016-01-23 (×4): 50 ug via ORAL
  Filled 2016-01-19: qty 1
  Filled 2016-01-19: qty 2
  Filled 2016-01-19 (×2): qty 1

## 2016-01-19 MED ORDER — ENSURE ENLIVE PO LIQD
237.0000 mL | Freq: Three times a day (TID) | ORAL | Status: DC
  Administered 2016-01-19 – 2016-01-23 (×10): 237 mL via ORAL

## 2016-01-19 MED ORDER — SODIUM CHLORIDE 0.9 % IV SOLN
INTRAVENOUS | Status: DC
  Administered 2016-01-19: 4.9 [IU]/h via INTRAVENOUS
  Filled 2016-01-19: qty 2.5

## 2016-01-19 MED ORDER — SODIUM CHLORIDE 0.9 % IV SOLN
INTRAVENOUS | Status: DC
  Administered 2016-01-20 – 2016-01-22 (×5): via INTRAVENOUS

## 2016-01-19 MED ORDER — ARIPIPRAZOLE 5 MG PO TABS
5.0000 mg | ORAL_TABLET | Freq: Every day | ORAL | Status: DC
  Administered 2016-01-19 – 2016-01-23 (×5): 5 mg via ORAL
  Filled 2016-01-19 (×5): qty 1

## 2016-01-19 MED ORDER — AMLODIPINE BESYLATE 5 MG PO TABS
10.0000 mg | ORAL_TABLET | Freq: Every day | ORAL | Status: DC
  Administered 2016-01-20 – 2016-01-23 (×4): 10 mg via ORAL
  Filled 2016-01-19 (×4): qty 2

## 2016-01-19 NOTE — ED Triage Notes (Signed)
Pt arrives from [facility]. Pt is a IDDM type 1 and is compliant with her medications. Nursing home states she goes through DKA on a frequent monthly basis. CBG was 246 last night and upon waking had 2 readings of HI. Pt endorses n/v, denies pain.

## 2016-01-19 NOTE — ED Notes (Signed)
Attempted report to 2H 

## 2016-01-19 NOTE — ED Notes (Signed)
Pt's CBG Result: 394,  Informed Nicole-RN.

## 2016-01-19 NOTE — H&P (Signed)
History and Physical    Beth Aristahomasine C Mcelhinny ZOX:[id number] DOB: 03/09/1941 DOA: 01/19/2016  PCP: Julian HySOUTH,STEPHEN ALAN, MD Patient coming from: [facility]   Chief Complaint: Hyperglycemia  HPI: Beth Payne is a 75 y.o. female with medical history significant of type 1 diabetes, MI, TIA, chronic pain, CAD, dementia, fibromyalgia, GERD.   Level V caveat applies as patient has dementia and is unable to provide reliable history at this time.  Upon my exam patient is providing differing details into her recent ankle history. She states both that her blood sugar has been elevated for several weeks but then changes her story and says it was only elevated for the last 1-2 days above 500. States that she does have nausea and abdominal pain but denies other symptoms such as fevers, back pain, chest pain, shortness of breath, cough. When asked about her medications and what was done at the nursing home to reverse her high sugar patient states "I guess nothing. "    ED Course: DKA noted w/ labs. Started on glucose stabilizer  Review of Systems: As per HPI otherwise 10 point review of systems negative.   Ambulatory Status: minimal ambulation  Past Medical History:  Diagnosis Date  . Anxiety   . Arthritis    "fingers" (12/23/2014)  . Chronic lower back pain   . Coronary artery disease   . Dementia   . Depression   . Fibromyalgia   . Fracture, ankle 12/23/2014  . GERD (gastroesophageal reflux disease) dx'd 1995  . Heart murmur   . NSTEMI (non-ST elevated myocardial infarction) (HCC) 12/13/2014  . Osteopenia dx'd 2007  . Retinopathy   . TIA (transient ischemic attack)   . Type I diabetes mellitus (HCC) dx'd 1953   "I'm on a pup" (12/23/2014)    Past Surgical History:  Procedure Laterality Date  . ABDOMINAL HYSTERECTOMY  1972  . ANTERIOR CERVICAL DECOMP/DISCECTOMY FUSION  05/2003   "C5-6"  . APPENDECTOMY    . BACK SURGERY    . BILATERAL CARPAL TUNNEL RELEASE Bilateral 2003-2004  .  BUNIONECTOMY WITH HAMMERTOE RECONSTRUCTION Right 02/1999  . CATARACT EXTRACTION W/ INTRAOCULAR LENS  IMPLANT, BILATERAL Bilateral 1998  . CERVICAL DISC SURGERY  2004   "collapsed C-5"  . CESAREAN SECTION    . CORONARY ANGIOPLASTY WITH STENT PLACEMENT  07/1999   "?1"  . DILATION AND CURETTAGE OF UTERUS    . EXPLORATORY LAPAROTOMY  1975-1987 X 1   "adhesions"  . EYE SURGERY Bilateral since 1977   "numerous slaser surgeries for retinopathy"  . FRACTURE SURGERY    . LAPAROSCOPIC LYSIS OF ADHESIONS  [phone] X 5  . LUMBAR MICRODISCECTOMY  03/2005  . OOPHORECTOMY Bilateral 1980's  . ORIF ANKLE FRACTURE Left 12/25/2014   Procedure: OPEN REDUCTION INTERNAL FIXATION (ORIF) LEFT TRIMALLEOLAR ANKLE FRACTURE;  Surgeon: Tarry KosNaiping M Xu, MD;  Location: [facility] OR;  Service: Orthopedics;  Laterality: Left;  . PATELLA FRACTURE SURGERY Left 04/2007  . TONSILLECTOMY    . TRIGGER FINGER RELEASE Bilateral 2003  . VERTEBROPLASTY  01/2008   "compressed lumbar fracture"  . VITRECTOMY Right 08/1997    Social History   Social History  . Marital status: Widowed    Spouse name: N/A  . Number of children: N/A  . Years of education: N/A   Occupational History  . Not on file.   Social History Main Topics  . Smoking status: Former Smoker    Packs/day: 1.00    Years: 27.00    Types: Cigarettes  . Smokeless  tobacco: Never Used     Comment: "quit smoking cigarettes in 1985  . Alcohol use No  . Drug use: No  . Sexual activity: Not on file   Other Topics Concern  . Not on file   Social History Narrative  . No narrative on file    Allergies  Allergen Reactions  . Diclofenac Sodium Swelling and Other (See Comments)    Blurred vision  . Doxycycline Calcium Swelling and Other (See Comments)    Blurred vision  . Calcium-Containing Compounds Other (See Comments)    Calcium listed as an allergy on [facility] 08/10/15  . Codeine Nausea And Vomiting and Other (See Comments)    Dizziness   . Zocor [Simvastatin] Other  (See Comments)    Dizziness, weakness     Family History  Problem Relation Age of Onset  . Liver disease Sister   . Colon cancer Brother     Prior to Admission medications   Medication Sig Start Date End Date Taking? Authorizing Provider  Amino Acids-Protein Hydrolys (FEEDING SUPPLEMENT, PRO-STAT SUGAR FREE 64,) LIQD Take 30 mLs by mouth 2 (two) times daily with a meal. 11/24/15   Christina P Rama, MD  amLODipine (NORVASC) 10 MG tablet Take 1 tablet (10 mg total) by mouth daily. 05/22/15   Richarda OverlieNayana Abrol, MD  ARIPiprazole (ABILIFY) 5 MG tablet Take 1 tablet (5 mg total) by mouth every morning. 04/28/15   Shanker Levora DredgeM Ghimire, MD  bisacodyl (DULCOLAX) 5 MG EC tablet Take 10 mg by mouth daily as needed for mild constipation or moderate constipation.    Historical Provider, MD  cholecalciferol (VITAMIN D) 1000 units tablet Take 2,000 Units by mouth daily.    Historical Provider, MD  donepezil (ARICEPT) 10 MG tablet Take 10 mg by mouth at bedtime.    Historical Provider, MD  feeding supplement, ENSURE ENLIVE, (ENSURE ENLIVE) LIQD Take 237 mLs by mouth 3 (three) times daily with meals. 11/24/15   Maryruth Bunhristina P Rama, MD  ferrous sulfate 325 (65 FE) MG tablet Take 325 mg by mouth 2 (two) times daily. 8am, 5pm    Historical Provider, MD  insulin aspart (NOVOLOG) 100 UNIT/ML injection Inject at bedtime based upon sugars.  Sugar 0-120--no units; 121-150--1 unit;  151-200--2 units;  201-250--3 units; 251-300--5 units;  301-350--7 units;  351-400--9 units 12/15/15   Catarina Hartshornavid Tat, MD  insulin aspart (NOVOLOG) 100 UNIT/ML injection Inject before each meal based upon sugars.  Sugar 0-200--no units;  201-250--2 units; 251-300--3 units; 301-350--4 units;  351-400--5 units 12/15/15   Catarina Hartshornavid Tat, MD  insulin detemir (LEVEMIR) 100 UNIT/ML injection Inject 4-8 Units into the skin 2 (two) times daily. Takes 8 units in am and 4 units in pm    Historical Provider, MD  lamoTRIgine (LAMICTAL) 25 MG tablet Take 50 mg by mouth at  bedtime.    Historical Provider, MD  levothyroxine (SYNTHROID, LEVOTHROID) 50 MCG tablet Take 50 mcg by mouth daily before breakfast.    Historical Provider, MD  LORazepam (ATIVAN) 0.5 MG tablet Take 1 tablet (0.5 mg total) by mouth at bedtime. Anxiety 12/15/15   Catarina Hartshornavid Tat, MD  metoCLOPramide (REGLAN) 5 MG tablet Take 1 tablet (5 mg total) by mouth 3 (three) times daily before meals. 11/24/15   Christina P Rama, MD  pantoprazole (PROTONIX) 40 MG tablet Take 1 tablet (40 mg total) by mouth 2 (two) times daily. Patient taking differently: Take 40 mg by mouth 2 (two) times daily. 8am, 4pm 04/28/15   Maretta BeesShanker M Ghimire, MD  polyethylene glycol (MIRALAX / GLYCOLAX) packet Take 17 g by mouth daily. 11/24/15   Christina P Rama, MD  prazosin (MINIPRESS) 1 MG capsule Take 1 mg by mouth at bedtime.     Historical Provider, MD  rosuvastatin (CRESTOR) 5 MG tablet Take 5 mg by mouth daily at 6 PM.     Historical Provider, MD  sertraline (ZOLOFT) 50 MG tablet Take 50 mg by mouth 3 (three) times daily.    Historical Provider, MD  traMADol (ULTRAM) 50 MG tablet Take 1 tablet (50 mg total) by mouth every 6 (six) hours as needed for moderate pain. 12/15/15   Catarina Hartshorn, MD    Physical Exam: Vitals:   01/19/16 1015 01/19/16 1030 01/19/16 1045 01/19/16 1130  BP: (!) 132/54 (!) 132/54 116/76 137/70  Pulse:    97  Resp: 18 (!) 28 21 21   Temp:      TempSrc:      SpO2:    98%  Weight:      Height:         General: Appears cachectic, resting in bed.  Eyes:  PERRL, EOMI, normal lids, iris ENT:  grossly normal hearing, lips & tongue, mmm Neck:  no LAD, masses or thyromegaly Cardiovascular:  RRR, no m/r/g. No LE edema.  Respiratory: Mild tachypnea, CTA bilaterally, no w/r/r. Normal respiratory effort. Abdomen:  soft, ntnd, NABS Skin:  no rash or induration seen on limited exam Musculoskeletal:  grossly normal tone BUE/BLE, good ROM, no bony abnormality Psychiatric: Pleasant, speech is somewhat delayed as patient  appears to be in deep thought but then is unable to provide reliable answer or becomes distracted. Neurologic:  CN 2-12 grossly intact, moves all extremities in coordinated fashion, sensation intact  Labs on Admission: I have personally reviewed following labs and imaging studies  CBC:  Recent Labs Lab 01/19/16 0946  WBC 7.3  NEUTROABS 6.0  HGB 11.7*  HCT 36.9  MCV 89.6  PLT 208   Basic Metabolic Panel:  Recent Labs Lab 01/19/16 0946  NA 134*  K 4.4  CL 99*  CO2 13*  GLUCOSE 603*  BUN 21*  CREATININE 1.25*  CALCIUM 10.4*  MG 1.9   GFR: Estimated Creatinine Clearance: 27.4 mL/min (by C-G formula based on SCr of 1.25 mg/dL). Liver Function Tests:  Recent Labs Lab 01/19/16 0946  AST 35  ALT 56*  ALKPHOS 107  BILITOT 1.6*  PROT 7.3  ALBUMIN 4.7   No results for input(s): LIPASE, AMYLASE in the last 168 hours. No results for input(s): AMMONIA in the last 168 hours. Coagulation Profile: No results for input(s): INR, PROTIME in the last 168 hours. Cardiac Enzymes: No results for input(s): CKTOTAL, CKMB, CKMBINDEX, TROPONINI in the last 168 hours. BNP (last 3 results) No results for input(s): PROBNP in the last 8760 hours. HbA1C: No results for input(s): HGBA1C in the last 72 hours. CBG:  Recent Labs Lab 01/19/16 1002 01/19/16 1152  GLUCAP 552* 490*   Lipid Profile: No results for input(s): CHOL, HDL, LDLCALC, TRIG, CHOLHDL, LDLDIRECT in the last 72 hours. Thyroid Function Tests: No results for input(s): TSH, T4TOTAL, FREET4, T3FREE, THYROIDAB in the last 72 hours. Anemia Panel: No results for input(s): VITAMINB12, FOLATE, FERRITIN, TIBC, IRON, RETICCTPCT in the last 72 hours. Urine analysis:    Component Value Date/Time   COLORURINE YELLOW 12/13/2015 2026   APPEARANCEUR CLEAR 12/13/2015 2026   LABSPEC 1.031 (H) 12/13/2015 2026   PHURINE 5.0 12/13/2015 2026   GLUCOSEU >1000 (A) 12/13/2015 2026  HGBUR NEGATIVE 12/13/2015 2026   BILIRUBINUR  NEGATIVE 12/13/2015 2026   KETONESUR 15 (A) 12/13/2015 2026   PROTEINUR NEGATIVE 12/13/2015 2026   UROBILINOGEN 0.2 02/23/2015 1433   NITRITE NEGATIVE 12/13/2015 2026   LEUKOCYTESUR NEGATIVE 12/13/2015 2026    Creatinine Clearance: Estimated Creatinine Clearance: 27.4 mL/min (by C-G formula based on SCr of 1.25 mg/dL).  Sepsis Labs: @LABRCNTIP (procalcitonin:4,lacticidven:4) )No results found for this or any previous visit (from the past 240 hour(s)).   Radiological Exams on Admission: No results found.   Assessment/Plan Active Problems:   GERD (gastroesophageal reflux disease)   Dyslipidemia   Diabetic ketoacidosis without coma associated with type 1 diabetes mellitus (HCC)   AKI (acute kidney injury) (HCC)   Dementia   Protein-calorie malnutrition, severe   DKA, type 1 (HCC)   Diabetic gastroparesis (HCC)   DKA: History of type 1 diabetes with intermittent episodes of DKA. Per report this happens on a somewhat regular basis. Per review of chart patient has been admitted 4 times since January of this year for DKA. Start on Glucophage and are in the ED. CBG at time of presentation 603, anion gap 22, VBG showing pH 7.23, PCO2 26, PaO2 60. Patient is to But otherwise vital signs are stable. It is unclear exactly what patient's insulin regimen is at the nursing home and what has been done to manage this more appropriately over the recent past. - Stepdown - Continue glucose stabilizer - resume levemir when [employer] closes x2 - Trend metabolic derangements, and make necessary changes as appropriate - Patient will need detailed titration type regimen at time of discharge to be sent to the nursing home for closer diabetic control. - Increase home Reglan (pt still actively w/ n/v) to 10mg  TID  AKI: cr 1.25. Baseline 0.6. Likely from #1 - IVF - BMETs  Hypothyroid: - continue Synthroid  Depression/Anxiety: - continue Ativan, zoloft  Chronic pain: - contineu tramadol  HTN: -  continue norvasc and minipress  Protein calorie malnutrition: - continue prostat and ensure   Dementia: at baseline - continue abilify, lamictal, aricept  GERD: - contineu protonix  HLD: - cotnineu crestor    DVT prophylaxis: Hep  Code Status: DNR - confirmed w/ Pts POA  [name] Loletha Carrow. DNR form signed at bedside since 11/2015.  Family Communication: friend - [name]  Disposition Plan: pendingi mprovement   Consults called: none  Admission status: stepdown - inpt    MERRELL, DAVID J MD [facility]  If 7PM-7AM, please contact night-coverage [email or web address] Password [email or web address]  01/19/2016, 12:01 PM

## 2016-01-19 NOTE — ED Notes (Signed)
Gave pt water, informed Nicole-RN.

## 2016-01-19 NOTE — ED Provider Notes (Signed)
[facility]-EMERGENCY DEPT Provider Note   CSN: [id number] Arrival date & time: 01/19/16  [id number]     History   Chief Complaint Chief Complaint  Patient presents with  . Hyperglycemia    HPI Beth Payne is a 75 y.o. female.  The history is provided by the patient. No language interpreter was used.   Beth Payne is a 75 y.o. female who presents to the Emergency Department complaining of hyperglycemia.  She has a history of type 1 diabetes and recurrent DKA. [facility] and has been receiving her insulin as directed, and with her diet. She states that for the last week her Sugars have been running high. They recently changed her over from insulin pump to sliding scale insulin. Today her sugars have been reading critical high and she developed nausea and vomiting as well. She denies any fevers, chest pain, shortness of breath, dysuria. She had diarrhea 3 weeks ago, now resolved.  Past Medical History:  Diagnosis Date  . Anxiety   . Arthritis    "fingers" (12/23/2014)  . Chronic lower back pain   . Coronary artery disease   . Dementia   . Depression   . Fibromyalgia   . Fracture, ankle 12/23/2014  . GERD (gastroesophageal reflux disease) dx'd 1995  . Heart murmur   . NSTEMI (non-ST elevated myocardial infarction) (HCC) 12/13/2014  . Osteopenia dx'd 2007  . Retinopathy   . TIA (transient ischemic attack)   . Type I diabetes mellitus (HCC) dx'd 1953   "I'm on a pup" (12/23/2014)    Patient Active Problem List   Diagnosis Date Noted  . DKA, type 1 (HCC) 01/19/2016  . Diabetic gastroparesis (HCC) 01/19/2016  . Transaminasemia   . DKA (diabetic ketoacidoses) (HCC) 12/13/2015  . Transaminitis 12/13/2015  . Hypomagnesemia 11/24/2015  . Protein-calorie malnutrition, severe 11/22/2015  . Pressure ulcer, stage I, non-blanching erythema 11/22/2015  . 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 Dementia 11/20/2015  . Abdominal pain 11/20/2015  . Weakness generalized  05/20/2015  . Lung mass 05/20/2015  . Hypokalemia 04/24/2015  . AKI (acute kidney injury) (HCC) 02/23/2015  . Acute encephalopathy 01/01/2015  . DM (diabetes mellitus), type 1 with complications (HCC) 12/17/2014  . CAD in native artery   . Pulmonary hypertension (HCC)   . GERD (gastroesophageal reflux disease) 12/12/2014  . CAD s/p stent to mid-RCA in 2001. 12/12/2014  . Osteoarthritis 12/12/2014  . Systolic murmur 12/12/2014  . Normocytic anemia 12/12/2014  . Dyslipidemia 12/12/2014  . Diabetic ketoacidosis without coma associated with type 1 diabetes mellitus [facility](HCC)     Past Surgical History:  Procedure Laterality Date  . ABDOMINAL HYSTERECTOMY  1972  . ANTERIOR CERVICAL DECOMP/DISCECTOMY FUSION  05/2003   "C5-6"  . APPENDECTOMY    . BACK SURGERY    . BILATERAL CARPAL TUNNEL RELEASE Bilateral 2003-2004  . BUNIONECTOMY WITH HAMMERTOE RECONSTRUCTION Right 02/1999  . CATARACT EXTRACTION W/ INTRAOCULAR LENS  IMPLANT, BILATERAL Bilateral 1998  . CERVICAL DISC SURGERY  2004   "collapsed C-5"  . CESAREAN SECTION    . CORONARY ANGIOPLASTY WITH STENT PLACEMENT  07/1999   "?1"  . DILATION AND CURETTAGE OF UTERUS    . EXPLORATORY LAPAROTOMY  1975-1987 X 1   "adhesions"  . EYE SURGERY Bilateral since 1977   "numerous slaser surgeries for retinopathy"  . FRACTURE SURGERY    . LAPAROSCOPIC LYSIS OF ADHESIONS  [phone] X 5  . LUMBAR MICRODISCECTOMY  03/2005  . OOPHORECTOMY Bilateral 1980's  .  ORIF ANKLE FRACTURE Left 12/25/2014   Procedure: OPEN REDUCTION INTERNAL FIXATION (ORIF) LEFT TRIMALLEOLAR ANKLE FRACTURE;  Surgeon: Tarry Kos, MD;  Location: [facility] OR;  Service: Orthopedics;  Laterality: Left;  . PATELLA FRACTURE SURGERY Left 04/2007  . TONSILLECTOMY    . TRIGGER FINGER RELEASE Bilateral 2003  . VERTEBROPLASTY  01/2008   "compressed lumbar fracture"  . VITRECTOMY Right 08/1997    OB History    No data available       Home Medications    Prior to Admission medications     Medication Sig Start Date End Date Taking? Authorizing Provider  Amino Acids-Protein Hydrolys (FEEDING SUPPLEMENT, PRO-STAT SUGAR FREE 64,) LIQD Take 30 mLs by mouth 2 (two) times daily with a meal. 11/24/15  Yes Christina P Rama, MD  amLODipine (NORVASC) 10 MG tablet Take 1 tablet (10 mg total) by mouth daily. 05/22/15  Yes Richarda Overlie, MD  ARIPiprazole (ABILIFY) 5 MG tablet Take 1 tablet (5 mg total) by mouth every morning. Patient taking differently: Take 5 mg by mouth daily.  04/28/15  Yes Shanker Levora Dredge, MD  bisacodyl (DULCOLAX) 5 MG EC tablet Take 10 mg by mouth daily as needed for mild constipation or moderate constipation.   Yes Historical Provider, MD  cholecalciferol (VITAMIN D) 1000 units tablet Take 2,000 Units by mouth daily.   Yes Historical Provider, MD  donepezil (ARICEPT) 10 MG tablet Take 10 mg by mouth at bedtime.   Yes Historical Provider, MD  ferrous sulfate 325 (65 FE) MG tablet Take 325 mg by mouth 2 (two) times daily. 8am, 4pm   Yes Historical Provider, MD  insulin aspart (NOVOLOG) 100 UNIT/ML injection Inject at bedtime based upon sugars.  Sugar 0-120--no units; 121-150--1 unit;  151-200--2 units;  201-250--3 units; 251-300--5 units;  301-350--7 units;  351-400--9 units Patient taking differently: Inject 0-9 Units into the skin See admin instructions. Inject 0-5 units subcutaneously before meals based on sliding scale: CBG 0-200 0 units, 201-250 2 units, 251-300 3 units, 301-350 4 units, 351-400 5 units; AND Inject 0-9 units subcutaneously at bedtime based on sliding scale: CBG 121-150 1 unit, 151-200 2 units, 201-250 3 units, 251-300 5 units, 301-350 7 units, 351-400 9 units. 12/15/15  Yes Catarina Hartshorn, MD  insulin detemir (LEVEMIR) 100 UNIT/ML injection Inject 6-8 Units into the skin See admin instructions. Inject 8 units subcutaneously every morning (8am) and 6 units every evening (5pm)   Yes Historical Provider, MD  lamoTRIgine (LAMICTAL) 25 MG tablet Take 50 mg by mouth at  bedtime.   Yes Historical Provider, MD  levothyroxine (SYNTHROID, LEVOTHROID) 50 MCG tablet Take 50 mcg by mouth daily before breakfast.   Yes Historical Provider, MD  LORazepam (ATIVAN) 0.5 MG tablet Take 1 tablet (0.5 mg total) by mouth at bedtime. Anxiety 12/15/15  Yes Catarina Hartshorn, MD  metoCLOPramide (REGLAN) 5 MG tablet Take 1 tablet (5 mg total) by mouth 3 (three) times daily before meals. Patient taking differently: Take 5 mg by mouth 3 (three) times daily before meals. 6:30am, 11:30am, 4:30pm 11/24/15  Yes Maryruth Bun Rama, MD  Nutritional Supplements (NUTRITIONAL SUPPLEMENT PO) Take 120 mLs by mouth 3 (three) times daily. Sugar Free Med Pass   Yes Historical Provider, MD  pantoprazole (PROTONIX) 40 MG tablet Take 1 tablet (40 mg total) by mouth 2 (two) times daily. Patient taking differently: Take 40 mg by mouth 2 (two) times daily. 7:30am, 4:30pm 04/28/15  Yes Shanker Levora Dredge, MD  polyethylene glycol (MIRALAX / Ethelene Hal)  packet Take 17 g by mouth daily. Patient taking differently: Take 17 g by mouth daily. Mix in 8 oz water and drink 11/24/15  Yes Christina P Rama, MD  prazosin (MINIPRESS) 1 MG capsule Take 1 mg by mouth at bedtime.    Yes Historical Provider, MD  rosuvastatin (CRESTOR) 5 MG tablet Take 5 mg by mouth daily at 6 PM.    Yes Historical Provider, MD  sertraline (ZOLOFT) 50 MG tablet Take 50 mg by mouth 3 (three) times daily. 6am, 2pm, 10pm   Yes Historical Provider, MD  traMADol (ULTRAM) 50 MG tablet Take 1 tablet (50 mg total) by mouth every 6 (six) hours as needed for moderate pain. 12/15/15  Yes Catarina Hartshornavid Tat, MD  feeding supplement, ENSURE ENLIVE, (ENSURE ENLIVE) LIQD Take 237 mLs by mouth 3 (three) times daily with meals. Patient not taking: Reported on 01/19/2016 11/24/15   Maryruth Bunhristina P Rama, MD  insulin aspart (NOVOLOG) 100 UNIT/ML injection Inject before each meal based upon sugars.  Sugar 0-200--no units;  201-250--2 units; 251-300--3 units; 301-350--4 units;  351-400--5  units Patient not taking: Reported on 01/19/2016 12/15/15   Catarina Hartshornavid Tat, MD    Family History Family History  Problem Relation Age of Onset  . Liver disease Sister   . Colon cancer Brother     Social History Social History  Substance Use Topics  . Smoking status: Former Smoker    Packs/day: 1.00    Years: 27.00    Types: Cigarettes  . Smokeless tobacco: Never Used     Comment: "quit smoking cigarettes in 1985  . Alcohol use No     Allergies   Diclofenac sodium; Doxycycline calcium; Calcium-containing compounds; Codeine; and Zocor [simvastatin]   Review of Systems Review of Systems  All other systems reviewed and are negative.    Physical Exam Updated Vital Signs BP (!) 124/54   Pulse 80   Temp 98.2 F (36.8 C) (Oral)   Resp 20   Ht 5\' 3"  (1.6 m)   Wt 91 lb 14.9 oz (41.7 kg)   SpO2 98%   BMI 16.29 kg/m   Physical Exam  Constitutional: She is oriented to person, place, and time. She appears well-developed.  Thin  HENT:  Head: Normocephalic and atraumatic.  Cardiovascular: Normal rate and regular rhythm.   No murmur heard. Pulmonary/Chest: Effort normal and breath sounds normal. No respiratory distress.  Abdominal: Soft. There is no tenderness. There is no rebound and no guarding.  Musculoskeletal: She exhibits no edema or tenderness.  Neurological: She is alert and oriented to person, place, and time.  Skin: Skin is warm and dry.  Psychiatric: She has a normal mood and affect. Her behavior is normal.  Nursing note and vitals reviewed.    ED Treatments / Results  Labs (all labs ordered are listed, but only abnormal results are displayed) Labs Reviewed  COMPREHENSIVE METABOLIC PANEL - Abnormal; Notable for the following:       Result Value   Sodium 134 (*)    Chloride 99 (*)    CO2 13 (*)    Glucose, Bld 603 (*)    BUN 21 (*)    Creatinine, Ser 1.25 (*)    Calcium 10.4 (*)    ALT 56 (*)    Total Bilirubin 1.6 (*)    GFR calc non Af Amer 41 (*)     GFR calc Af Amer 48 (*)    Anion gap 22 (*)    All other components within normal  limits  CBC WITH DIFFERENTIAL/PLATELET - Abnormal; Notable for the following:    Hemoglobin 11.7 (*)    All other components within normal limits  BASIC METABOLIC PANEL - Abnormal; Notable for the following:    CO2 13 (*)    Glucose, Bld 461 (*)    BUN 22 (*)    Creatinine, Ser 1.27 (*)    Calcium 10.5 (*)    GFR calc non Af Amer 41 (*)    GFR calc Af Amer 47 (*)    Anion gap 22 (*)    All other components within normal limits  CBC - Abnormal; Notable for the following:    WBC 14.2 (*)    Hemoglobin 11.6 (*)    All other components within normal limits  CBG MONITORING, ED - Abnormal; Notable for the following:    Glucose-Capillary 552 (*)    All other components within normal limits  I-STAT VENOUS BLOOD GAS, ED - Abnormal; Notable for the following:    pH, Ven 7.236 (*)    pCO2, Ven 26.1 (*)    pO2, Ven 60.0 (*)    Bicarbonate 11.1 (*)    Acid-base deficit 15.0 (*)    All other components within normal limits  CBG MONITORING, ED - Abnormal; Notable for the following:    Glucose-Capillary 490 (*)    All other components within normal limits  CBG MONITORING, ED - Abnormal; Notable for the following:    Glucose-Capillary 394 (*)    All other components within normal limits  CBG MONITORING, ED - Abnormal; Notable for the following:    Glucose-Capillary 319 (*)    All other components within normal limits  CBG MONITORING, ED - Abnormal; Notable for the following:    Glucose-Capillary 255 (*)    All other components within normal limits  MRSA PCR SCREENING  MAGNESIUM  URINALYSIS, ROUTINE W REFLEX MICROSCOPIC (NOT AT [facility])  BASIC METABOLIC PANEL  BASIC METABOLIC PANEL  BASIC METABOLIC PANEL  BASIC METABOLIC PANEL  I-STAT TROPOININ, ED    EKG  EKG Interpretation  Date/Time:  Monday January 19 2016 09:58:16 EDT Ventricular Rate:  89 PR Interval:    QRS Duration: 131 QT  Interval:  414 QTC Calculation: 504 R Axis:   -27 Text Interpretation:  Sinus rhythm Probable left atrial enlargement Right bundle branch block Confirmed by Lincoln Brigham [phone]) on 01/19/2016 10:50:19 AM       Radiology No results found.  Procedures Procedures (including critical care time)  Medications Ordered in ED Medications  traMADol (ULTRAM) tablet 50 mg (not administered)  LORazepam (ATIVAN) tablet 0.5 mg (not administered)  levothyroxine (SYNTHROID, LEVOTHROID) tablet 50 mcg (not administered)  feeding supplement (PRO-STAT SUGAR FREE 64) liquid 30 mL (not administered)  feeding supplement (ENSURE ENLIVE) (ENSURE ENLIVE) liquid 237 mL (not administered)  ARIPiprazole (ABILIFY) tablet 5 mg (not administered)  lamoTRIgine (LAMICTAL) tablet 50 mg (not administered)  sertraline (ZOLOFT) tablet 50 mg (not administered)  polyethylene glycol (MIRALAX / GLYCOLAX) packet 17 g (not administered)  amLODipine (NORVASC) tablet 10 mg (not administered)  rosuvastatin (CRESTOR) tablet 5 mg (not administered)  donepezil (ARICEPT) tablet 10 mg (not administered)  prazosin (MINIPRESS) capsule 1 mg (not administered)  0.9 %  sodium chloride infusion ( Intravenous Stopped 01/19/16 1434)  0.9 %  sodium chloride infusion (not administered)  dextrose 5 %-0.45 % sodium chloride infusion (not administered)  insulin regular (NOVOLIN R,HUMULIN R) 250 Units in sodium chloride 0.9 % 250 mL (1 Units/mL) infusion (5.9 Units/hr  Intravenous Rate/Dose Change 01/19/16 1538)  sodium chloride flush (NS) 0.9 % injection 3 mL (not administered)  metoCLOPramide (REGLAN) injection 10 mg (not administered)  ondansetron (ZOFRAN) tablet 4 mg (not administered)    Or  ondansetron (ZOFRAN) injection 4 mg (not administered)  heparin injection 5,000 Units (not administered)  pantoprazole (PROTONIX) EC tablet 40 mg (not administered)  sodium chloride 0.9 % bolus 500 mL (0 mLs Intravenous Stopped 01/19/16 1432)  ondansetron  (ZOFRAN) injection 4 mg (4 mg Intravenous Given 01/19/16 1002)  ondansetron (ZOFRAN) 4 MG/2ML injection (4 mg  Given 01/19/16 1252)     Initial Impression / Assessment and Plan / ED Course  I have reviewed the triage vital signs and the nursing notes.  Pertinent labs & imaging results that were available during my care of the patient were reviewed by me and considered in my medical decision making (see chart for details).  Clinical Course  Patient is a type I diabetic here with elevated blood sugars, vomiting. She is nontoxic appearing on examination. Labs demonstrate mild renal insufficiency with DKA. She was started on glucose stabilizer with plan to admit to the hospitalist service for further treatment.   Final Clinical Impressions(s) / ED Diagnoses   Final diagnoses:  Diabetic ketoacidosis without coma associated with type 1 diabetes mellitus [facility])    New Prescriptions Current Discharge Medication List       Tilden Fossa, MD 01/19/16 1636

## 2016-01-19 NOTE — ED Notes (Signed)
Pt's linens, bed pads and brief were changed. Pt was placed in a gown and yellow socks were put on pt's feet.

## 2016-01-20 DIAGNOSIS — F0390 Unspecified dementia without behavioral disturbance: Secondary | ICD-10-CM

## 2016-01-20 DIAGNOSIS — E038 Other specified hypothyroidism: Secondary | ICD-10-CM

## 2016-01-20 DIAGNOSIS — E1051 Type 1 diabetes mellitus with diabetic peripheral angiopathy without gangrene: Secondary | ICD-10-CM

## 2016-01-20 DIAGNOSIS — E039 Hypothyroidism, unspecified: Secondary | ICD-10-CM | POA: Diagnosis present

## 2016-01-20 LAB — GLUCOSE, CAPILLARY
GLUCOSE-CAPILLARY: 67 mg/dL (ref 65–99)
GLUCOSE-CAPILLARY: 91 mg/dL (ref 65–99)
GLUCOSE-CAPILLARY: 94 mg/dL (ref 65–99)
Glucose-Capillary: 132 mg/dL — ABNORMAL HIGH (ref 65–99)
Glucose-Capillary: 166 mg/dL — ABNORMAL HIGH (ref 65–99)
Glucose-Capillary: 206 mg/dL — ABNORMAL HIGH (ref 65–99)
Glucose-Capillary: 226 mg/dL — ABNORMAL HIGH (ref 65–99)
Glucose-Capillary: 371 mg/dL — ABNORMAL HIGH (ref 65–99)
Glucose-Capillary: 388 mg/dL — ABNORMAL HIGH (ref 65–99)
Glucose-Capillary: 57 mg/dL — ABNORMAL LOW (ref 65–99)

## 2016-01-20 LAB — BASIC METABOLIC PANEL
ANION GAP: 6 (ref 5–15)
Anion gap: 8 (ref 5–15)
BUN: 17 mg/dL (ref 6–20)
BUN: 9 mg/dL (ref 6–20)
CALCIUM: 9.6 mg/dL (ref 8.9–10.3)
CO2: 24 mmol/L (ref 22–32)
CO2: 25 mmol/L (ref 22–32)
CREATININE: 0.73 mg/dL (ref 0.44–1.00)
CREATININE: 0.61 mg/dL (ref 0.44–1.00)
Calcium: 9.7 mg/dL (ref 8.9–10.3)
Chloride: 105 mmol/L (ref 101–111)
Chloride: 106 mmol/L (ref 101–111)
GFR calc Af Amer: >60 mL/min (ref >60)
GLUCOSE: 225 mg/dL — AB (ref 65–99)
GLUCOSE: 219 mg/dL — AB (ref 65–99)
POTASSIUM: 3.2 mmol/L — AB (ref 3.5–5.1)
Potassium: 3.5 mmol/L (ref 3.5–5.1)
SODIUM: 137 mmol/L (ref 135–145)
Sodium: 137 mmol/L (ref 135–145)

## 2016-01-20 LAB — LIPID PANEL
Cholesterol: 150 mg/dL (ref 0–200)
HDL: 63 mg/dL (ref >40)
LDL CALC: 70 mg/dL (ref 0–99)
TRIGLYCERIDES: 84 mg/dL (ref <150)
Total CHOL/HDL Ratio: 2.4 RATIO
VLDL: 17 mg/dL (ref 0–40)

## 2016-01-20 LAB — CBC
HEMATOCRIT: 31.3 % — AB (ref 36.0–46.0)
Hemoglobin: 10.2 g/dL — ABNORMAL LOW (ref 12.0–15.0)
MCH: 28.2 pg (ref 26.0–34.0)
MCHC: 32.6 g/dL (ref 30.0–36.0)
MCV: 86.5 fL (ref 78.0–100.0)
PLATELETS: 200 10*3/uL (ref 150–400)
RBC: 3.62 MIL/uL — ABNORMAL LOW (ref 3.87–5.11)
RDW: 14.3 % (ref 11.5–15.5)
WBC: 7.2 10*3/uL (ref 4.0–10.5)

## 2016-01-20 LAB — TSH: TSH: 1.398 u[IU]/mL (ref 0.350–4.500)

## 2016-01-20 MED ORDER — POTASSIUM CHLORIDE CRYS ER 20 MEQ PO TBCR
40.0000 meq | EXTENDED_RELEASE_TABLET | Freq: Once | ORAL | Status: AC
  Administered 2016-01-20: 40 meq via ORAL
  Filled 2016-01-20: qty 2

## 2016-01-20 MED ORDER — DEXTROSE 50 % IV SOLN
1.0000 | Freq: Once | INTRAVENOUS | Status: AC
  Administered 2016-01-20: 50 mL via INTRAVENOUS
  Filled 2016-01-20: qty 50

## 2016-01-20 MED ORDER — INSULIN ASPART 100 UNIT/ML ~~LOC~~ SOLN
5.0000 [IU] | Freq: Three times a day (TID) | SUBCUTANEOUS | Status: DC
  Administered 2016-01-21: 5 [IU] via SUBCUTANEOUS

## 2016-01-20 MED ORDER — INSULIN ASPART 100 UNIT/ML ~~LOC~~ SOLN
8.0000 [IU] | Freq: Once | SUBCUTANEOUS | Status: AC
  Administered 2016-01-20: 8 [IU] via SUBCUTANEOUS

## 2016-01-20 MED ORDER — DEXTROSE 50 % IV SOLN: 25.0000 mL | Freq: Once | INTRAVENOUS | Status: DC

## 2016-01-20 MED ORDER — DEXTROSE 50 % IV SOLN
INTRAVENOUS | Status: AC
  Filled 2016-01-20: qty 50

## 2016-01-20 NOTE — Clinical Social Work Note (Signed)
Clinical Social Work Assessment  Patient Details  Name: Beth Payne MRN: [id number] Date of Birth: 02/07/1941  Date of referral:  01/20/16               Reason for consult:  Facility Placement                Permission sought to share information with:  Facility Medical sales representativeContact Representative, Family Supports Permission granted to share information::  No (Patient is disoriented; completed assessment w/ HCPOA Beth Payne)  Name::     Beth Payne  Agency::  [facility]  Relationship::  HCPOA  Contact Information:     Housing/Transportation Living arrangements for the past 2 months:  Skilled Building surveyorursing Facility Source of Information:  [employer] Patient Interpreter Needed:  None Criminal Activity/Legal Involvement Pertinent to Current Situation/Hospitalization:  No - Comment as needed Significant Relationships:  Friend Lives with:  Facility Resident Do you feel safe going back to the place where you live?  Yes Need for family participation in patient care:  Yes (Comment)  Care giving concerns:  CSW received referral for discharge planning. Patient is disoriented. CSW spoke with patient's HCPOA, Beth Payne. He stated that patient is from [facility] and would like her to return there at discharge. CSW to continue to follow and assist with discharge planning needs.   Social Worker assessment / plan:  CSW spoke with patient's HCPOA regarding patient going back to SNF.  Employment status:  Retired Health and safety inspectornsurance information:  Medicare PT Recommendations:  Not assessed at this time Information / Referral to community resources:  Skilled Nursing Facility  Patient/Family's Response to care:  Beth LeePatient's HCPOA stated that he wanted patient to get back to [facility] and expressed appreciation for CSW.  Patient/Family's Understanding of and Emotional Response to Diagnosis, Current Treatment, and Prognosis:  Patient/family is realistic regarding therapy needs and expressed being hopeful for returning to SNF  placement. No questions/concerns about plan or treatment.    Emotional Assessment Appearance:  Appears stated age Attitude/Demeanor/Rapport:  Unable to Assess Affect (typically observed):  Unable to Assess Orientation:  Oriented to Self, Oriented to Place Alcohol / Substance use:  Not Applicable Psych involvement (Current and /or in the community):  No (Comment)  Discharge Needs  Concerns to be addressed:  Care Coordination Readmission within the last 30 days:  Yes Current discharge risk:  None Barriers to Discharge:  Continued Medical Work up   Beth Payne, LCSWA 01/20/2016, 3:01 PM

## 2016-01-20 NOTE — Progress Notes (Signed)
Hypoglycemic Event  CBG: 67  Treatment: D50 IV 25 mL  Symptoms: None  Follow-up CBG: Time:2:34am CBG Result:206  Possible Reasons for Event: Poor appetite and insulin drip Comments/MD notified: Jimmye NormanKaren Kirby-Graham NP was notified and orders were given to give an amp D50    Beth SeenGray, Beth Payne Beth Payne

## 2016-01-20 NOTE — Progress Notes (Signed)
PROGRESS NOTE    Beth Payne  QMV:[id number] DOB: 07/12/1940 DOA: 01/19/2016 PCP: Julian HySOUTH,STEPHEN ALAN, MD   Brief Narrative:  75 y.o. BF PMHx Dementia (waxes and wanes), Depression, Anxiety, TIA, DM type 1 uncontrolled with Complications, MI CAD, Chronic Pain syndrome, Fibromyalgia, GERD.   Level V caveat applies as patient has dementia and is unable to provide reliable history at this time.  Upon my exam patient is providing differing details into her recent ankle history. She states both that her blood sugar has been elevated for several weeks but then changes her story and says it was only elevated for the last 1-2 days above 500. States that she does have nausea and abdominal pain but denies other symptoms such as fevers, back pain, chest pain, shortness of breath, cough. When asked about her medications and what was done at the nursing home to reverse her high sugar patient states "I guess nothing. "   Subjective: 8/15 A/O 3 (does not know when). States at SNF CBG uncontrolled can be as high as 500+, as low as 60- 70.    Assessment & Plan:   Active Problems:   GERD (gastroesophageal reflux disease)   Dyslipidemia   Diabetic ketoacidosis without coma associated with type 1 diabetes mellitus (HCC)   AKI (acute kidney injury) (HCC)   Dementia   Protein-calorie malnutrition, severe   DKA (diabetic ketoacidoses) (HCC)   DKA, type 1 (HCC)   Diabetic gastroparesis (HCC)   DKA: History of type 1 diabetes  -Multiple episodes of DKA: Per review of chart patient has been admitted 4 times since January 2017.   -will need to ensure that SNF checks patient's FSBS QAC/QHS at a minimum  - Levemir 6 units QHS, 8 units daily -NovoLog 5 units QAC -Sensitive SSI -- Increase home Reglan 10mg  TID  Acute kidney failure:(Baseline 0.6) Lab Results  Component Value Date   CREATININE 0.61 01/20/2016   CREATININE 0.73 01/20/2016   CREATININE 0.72 01/19/2016  -At  baseline  Hypothyroid: - Synthroid 50 g daily -TSH pending   Depression/Anxiety: - continue Ativan, zoloft  Chronic pain: - contineu tramadol  HTN: - amlodipine 10 mg next line -Prazosin 1 mg QHS   Protein calorie malnutrition: - continue prostat and ensure   Dementia: at baseline - continue abilify, lamictal, aricept  HLD: - Continue Crestor 5 mg daily -Lipid panel pendingcrestor  Hypokalemia -K Dur 40 mEq   DVT prophylaxis: Subcutaneous heparin Code Status: DNR - confirmed w/ Pts POA  [name] Loletha CarrowBruner. DNR form signed at bedside since 11/2015.  Family Communication: none Disposition Plan: DC on 8/16   Consultants:  None  Procedures/Significant Events:  None  Cultures None  Antimicrobials: None   Devices None   LINES / TUBES:  None    Continuous Infusions: . sodium chloride 75 mL/hr at 01/20/16 0234     Objective: Vitals:   01/19/16 2200 01/19/16 2300 01/20/16 0000 01/20/16 0839  BP: (!) 130/57 (!) 150/64  (!) 155/72  Pulse: 77 75  69  Resp: 17 16  15   Temp:   98.2 F (36.8 C) 97.9 F (36.6 C)  TempSrc:   Oral Oral  SpO2: 99% 98%  97%  Weight:      Height:        Intake/Output Summary (Last 24 hours) at 01/20/16 0852 Last data filed at 01/20/16 [id number]  Gross per 24 hour  Intake          1009.55 ml  Output  0 ml  Net          1009.55 ml   Filed Weights   01/19/16 1006 01/19/16 1600  Weight: 44 kg (97 lb) 41.7 kg (91 lb 14.9 oz)    Examination:  General:A/O 3 (does not know when)., No acute respiratory distress Eyes: negative scleral hemorrhage, negative anisocoria, negative icterus ENT: Negative Runny nose, negative gingival bleeding, Neck:  Negative scars, masses, torticollis, lymphadenopathy, JVD Lungs: Clear to auscultation bilaterally without wheezes or crackles Cardiovascular: Regular rate and rhythm without murmur gallop or rub normal S1 and S2 Abdomen: negative abdominal pain, nondistended, positive  soft, bowel sounds, no rebound, no ascites, no appreciable mass Extremities: No significant cyanosis, clubbing, or edema bilateral lower extremities Skin: Negative rashes, lesions, ulcers Psychiatric:  Negative depression, negative anxiety, negative fatigue, negative mania  Central nervous system:  Cranial nerves II through XII intact, tongue/uvula midline, all extremities muscle strength 5/5, sensation intact throughout,  negative dysarthria, negative expressive aphasia, negative receptive aphasia.  .     Data Reviewed: Care during the described time interval was provided by me .  I have reviewed this patient's available data, including medical history, events of note, physical examination, and all test results as part of my evaluation. I have personally reviewed and interpreted all radiology studies.  CBC:  Recent Labs Lab 01/19/16 0946 01/19/16 1251  WBC 7.3 14.2*  NEUTROABS 6.0  --   HGB 11.7* 11.6*  HCT 36.9 36.4  MCV 89.6 89.4  PLT 208 224   Basic Metabolic Panel:  Recent Labs Lab 01/19/16 0946 01/19/16 1251 01/19/16 1756 01/19/16 2148 01/20/16 0218  NA 134* 137 135 139 137  K 4.4 4.0 3.7 3.5 3.2*  CL 99* 102 105 105 105  CO2 13* 13* 22 25 24   GLUCOSE 603* 461* 167* 138* 225*  BUN 21* 22* 19 20 17   CREATININE 1.25* 1.27* 0.76 0.72 0.73  CALCIUM 10.4* 10.5* 10.3 10.6* 9.7  MG 1.9  --   --   --   --    GFR: Estimated Creatinine Clearance: 40.6 mL/min (by C-G formula based on SCr of 0.8 mg/dL). Liver Function Tests:  Recent Labs Lab 01/19/16 0946  AST 35  ALT 56*  ALKPHOS 107  BILITOT 1.6*  PROT 7.3  ALBUMIN 4.7   No results for input(s): LIPASE, AMYLASE in the last 168 hours. No results for input(s): AMMONIA in the last 168 hours. Coagulation Profile: No results for input(s): INR, PROTIME in the last 168 hours. Cardiac Enzymes: No results for input(s): CKTOTAL, CKMB, CKMBINDEX, TROPONINI in the last 168 hours. BNP (last 3 results) No results for  input(s): PROBNP in the last 8760 hours. HbA1C: No results for input(s): HGBA1C in the last 72 hours. CBG:  Recent Labs Lab 01/20/16 0200 01/20/16 0231 01/20/16 0355 01/20/16 0541 01/20/16 0836  GLUCAP 67 206* 132* 91 57*   Lipid Profile: No results for input(s): CHOL, HDL, LDLCALC, TRIG, CHOLHDL, LDLDIRECT in the last 72 hours. Thyroid Function Tests: No results for input(s): TSH, T4TOTAL, FREET4, T3FREE, THYROIDAB in the last 72 hours. Anemia Panel: No results for input(s): VITAMINB12, FOLATE, FERRITIN, TIBC, IRON, RETICCTPCT in the last 72 hours. Urine analysis:    Component Value Date/Time   COLORURINE YELLOW 12/13/2015 2026   APPEARANCEUR CLEAR 12/13/2015 2026   LABSPEC 1.031 (H) 12/13/2015 2026   PHURINE 5.0 12/13/2015 2026   GLUCOSEU >1000 (A) 12/13/2015 2026   HGBUR NEGATIVE 12/13/2015 2026   BILIRUBINUR NEGATIVE 12/13/2015 2026   Lavenia AtlasKETONESUR  15 (A) 12/13/2015 2026   PROTEINUR NEGATIVE 12/13/2015 2026   UROBILINOGEN 0.2 02/23/2015 1433   NITRITE NEGATIVE 12/13/2015 2026   LEUKOCYTESUR NEGATIVE 12/13/2015 2026   Sepsis Labs: @LABRCNTIP (procalcitonin:4,lacticidven:4)  ) Recent Results (from the past 240 hour(s))  MRSA PCR Screening     Status: None   Collection Time: 01/19/16  4:10 PM  Result Value Ref Range Status   MRSA by PCR NEGATIVE NEGATIVE Final    Comment:        The GeneXpert MRSA Assay (FDA approved for NASAL specimens only), is one component of a comprehensive MRSA colonization surveillance program. It is not intended to diagnose MRSA infection nor to guide or monitor treatment for MRSA infections.          Radiology Studies: No results found.      Scheduled Meds: . dextrose      . amLODipine  10 mg Oral Daily  . ARIPiprazole  5 mg Oral Daily  . dextrose  25 mL Intravenous Once  . donepezil  10 mg Oral QHS  . feeding supplement (ENSURE ENLIVE)  237 mL Oral TID WC  . feeding supplement (PRO-STAT SUGAR FREE 64)  30 mL Oral BID  WC  . heparin  5,000 Units Subcutaneous Q8H  . insulin aspart  0-9 Units Subcutaneous TID WC  . insulin detemir  6 Units Subcutaneous QHS  . insulin detemir  8 Units Subcutaneous Daily  . lamoTRIgine  50 mg Oral QHS  . levothyroxine  50 mcg Oral QAC breakfast  . LORazepam  0.5 mg Oral QHS  . metoCLOPramide (REGLAN) injection  10 mg Intravenous Q8H  . pantoprazole  40 mg Oral BID  . polyethylene glycol  17 g Oral Daily  . prazosin  1 mg Oral QHS  . rosuvastatin  5 mg Oral q1800  . sertraline  50 mg Oral TID  . sodium chloride flush  3 mL Intravenous Q12H   Continuous Infusions: . sodium chloride 75 mL/hr at 01/20/16 0234     LOS: 1 day    Time spent: 40 minutes    WOODS, Roselind Messier, MD [facility] Pager [phone]   If 7PM-7AM, please contact night-coverage [email or web address] Password [facility] 01/20/2016, 8:52 AM

## 2016-01-20 NOTE — NC FL2 (Signed)
[place] MEDICAID FL2 LEVEL OF CARE SCREENING TOOL     IDENTIFICATION  Patient Name: Beth Payne Birthdate: 11/19/1940 Sex: female Admission Date (Current Location): 01/19/2016  [facility] [place]   Facility and Address:  [facility] [place], [address], [place], [place] [address]      Provider Number: [id number]  Attending Physician Name and Address:  Drema Dallasurtis J Woods, MD  Relative Name and Phone Number:  Stanton KidneyRon, HCPOA, [phone]    Current Level of Care: Hospital Recommended Level of Care: Skilled Nursing Facility Prior Approval Number:    Date Approved/Denied:   PASRR Number: [id number]  Discharge Plan: SNF    Current Diagnoses: Patient Active Problem List   Diagnosis Date Noted  . DKA, type 1 (HCC) 01/19/2016  . Diabetic gastroparesis (HCC) 01/19/2016  . Transaminasemia   . DKA (diabetic ketoacidoses) (HCC) 12/13/2015  . Transaminitis 12/13/2015  . Hypomagnesemia 11/24/2015  . Protein-calorie malnutrition, severe 11/22/2015  . Pressure ulcer, stage I, non-blanching erythema 11/22/2015  . 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 Dementia 11/20/2015  . Abdominal pain 11/20/2015  . Weakness generalized 05/20/2015  . Lung mass 05/20/2015  . Hypokalemia 04/24/2015  . AKI (acute kidney injury) (HCC) 02/23/2015  . Acute encephalopathy 01/01/2015  . DM (diabetes mellitus), type 1 with complications (HCC) 12/17/2014  . CAD in native artery   . Pulmonary hypertension (HCC)   . GERD (gastroesophageal reflux disease) 12/12/2014  . CAD s/p stent to mid-RCA in 2001. 12/12/2014  . Osteoarthritis 12/12/2014  . Systolic murmur 12/12/2014  . Normocytic anemia 12/12/2014  . Dyslipidemia 12/12/2014  . Diabetic ketoacidosis without coma associated with type 1 diabetes mellitus (HCC)     Orientation RESPIRATION BLADDER Height & Weight     Self, Place  Normal Incontinent Weight: 41.7 kg (91 lb 14.9 oz) Height:  5\' 3"  (160 cm)  BEHAVIORAL  SYMPTOMS/MOOD NEUROLOGICAL BOWEL NUTRITION STATUS      Continent Diet (Please see DC Summary)  AMBULATORY STATUS COMMUNICATION OF NEEDS Skin   Limited Assist Verbally Other (Comment) (Wound on shoulder and knee)                       Personal Care Assistance Level of Assistance  Bathing, Feeding, Dressing Bathing Assistance: Limited assistance Feeding assistance: Independent Dressing Assistance: Limited assistance     Functional Limitations Info  Hearing   Hearing Info: Impaired      SPECIAL CARE FACTORS FREQUENCY  PT (By licensed PT)     PT Frequency: 5x/week              Contractures      Additional Factors Info  Code Status, Allergies, Isolation Precautions, Psychotropic, Insulin Sliding Scale Code Status Info: DNR Allergies Info:  Diclofenac Sodium, Doxycycline Calcium, Calcium-containing Compounds, Codeine, Zocor Simvastatin Psychotropic Info: Ativan, Zoloft Insulin Sliding Scale Info: insulin aspart (novoLOG) injection 0-9 Units Isolation Precautions Info: MRSA     Current Medications (01/20/2016):  This is the current hospital active medication list Current Facility-Administered Medications  Medication Dose Route Frequency Provider Last Rate Last Dose  . 0.9 %  sodium chloride infusion   Intravenous Continuous Leda GauzeKaren J Kirby-Graham, NP 75 mL/hr at 01/20/16 0234    . amLODipine (NORVASC) tablet 10 mg  10 mg Oral Daily Ozella Rocksavid J Merrell, MD   10 mg at 01/20/16 [phone]  . ARIPiprazole (ABILIFY) tablet 5 mg  5 mg Oral Daily Ozella Rocksavid J Merrell, MD   5 mg at  01/20/16 0853  . dextrose 50 % solution 25 mL  25 mL Intravenous Once Drema Dallasurtis J Woods, MD      . donepezil (ARICEPT) tablet 10 mg  10 mg Oral QHS Ozella Rocksavid J Merrell, MD   10 mg at 01/19/16 2158  . feeding supplement (ENSURE ENLIVE) (ENSURE ENLIVE) liquid 237 mL  237 mL Oral TID WC Ozella Rocksavid J Merrell, MD   237 mL at 01/20/16 [id number]  . feeding supplement (PRO-STAT SUGAR FREE 64) liquid 30 mL  30 mL Oral BID WC Ozella Rocksavid J Merrell,  MD   30 mL at 01/20/16 0853  . heparin injection 5,000 Units  5,000 Units Subcutaneous Q8H Ozella Rocksavid J Merrell, MD   5,000 Units at 01/20/16 1426  . insulin aspart (novoLOG) injection 0-9 Units  0-9 Units Subcutaneous TID WC Leda GauzeKaren J Kirby-Graham, NP   2 Units at 01/20/16 1216  . insulin detemir (LEVEMIR) injection 6 Units  6 Units Subcutaneous QHS Leda GauzeKaren J Kirby-Graham, NP      . insulin detemir (LEVEMIR) injection 8 Units  8 Units Subcutaneous Daily Leda GauzeKaren J Kirby-Graham, NP      . lamoTRIgine (LAMICTAL) tablet 50 mg  50 mg Oral QHS Ozella Rocksavid J Merrell, MD   50 mg at 01/19/16 2158  . levothyroxine (SYNTHROID, LEVOTHROID) tablet 50 mcg  50 mcg Oral QAC breakfast Ozella Rocksavid J Merrell, MD   50 mcg at 01/20/16 [phone]  . LORazepam (ATIVAN) tablet 0.5 mg  0.5 mg Oral QHS Ozella Rocksavid J Merrell, MD   0.5 mg at 01/19/16 2158  . metoCLOPramide (REGLAN) injection 10 mg  10 mg Intravenous Q8H Ozella Rocksavid J Merrell, MD   10 mg at 01/20/16 1426  . ondansetron (ZOFRAN) tablet 4 mg  4 mg Oral Q6H PRN Ozella Rocksavid J Merrell, MD       Or  . ondansetron [facility](ZOFRAN) injection 4 mg  4 mg Intravenous Q6H PRN Ozella Rocksavid J Merrell, MD      . pantoprazole (PROTONIX) EC tablet 40 mg  40 mg Oral BID Scarlett Prestoheresa D Egan, RPH   40 mg at 01/20/16 [id number]  . polyethylene glycol (MIRALAX / GLYCOLAX) packet 17 g  17 g Oral Daily Ozella Rocksavid J Merrell, MD   17 g at 01/20/16 0853  . prazosin (MINIPRESS) capsule 1 mg  1 mg Oral QHS Ozella Rocksavid J Merrell, MD      . rosuvastatin (CRESTOR) tablet 5 mg  5 mg Oral q1800 Ozella Rocksavid J Merrell, MD   5 mg at 01/19/16 1723  . sertraline (ZOLOFT) tablet 50 mg  50 mg Oral TID Ozella Rocksavid J Merrell, MD   50 mg at 01/20/16 1426  . sodium chloride flush (NS) 0.9 % injection 3 mL  3 mL Intravenous Q12H Ozella Rocksavid J Merrell, MD   3 mL at 01/20/16 0854  . traMADol (ULTRAM) tablet 50 mg  50 mg Oral Q6H PRN Ozella Rocksavid J Merrell, MD         Discharge Medications: Please see discharge summary for a list of discharge medications.  Relevant Imaging Results:  Relevant Lab  Results:   Additional Information SSN:  [id number]  Dustina Scoggin S OverlandRayyan, [place]

## 2016-01-21 LAB — GLUCOSE, CAPILLARY
GLUCOSE-CAPILLARY: 33 mg/dL — AB (ref 65–99)
GLUCOSE-CAPILLARY: 34 mg/dL — AB (ref 65–99)
GLUCOSE-CAPILLARY: 236 mg/dL — AB (ref 65–99)
GLUCOSE-CAPILLARY: 353 mg/dL — AB (ref 65–99)
Glucose-Capillary: 98 mg/dL (ref 65–99)
Glucose-Capillary: 328 mg/dL — ABNORMAL HIGH (ref 65–99)

## 2016-01-21 MED ORDER — INSULIN ASPART 100 UNIT/ML ~~LOC~~ SOLN
0.0000 [IU] | Freq: Three times a day (TID) | SUBCUTANEOUS | Status: DC
  Administered 2016-01-21: 2 [IU] via SUBCUTANEOUS
  Administered 2016-01-22 (×2): 3 [IU] via SUBCUTANEOUS
  Administered 2016-01-22 – 2016-01-23 (×2): 2 [IU] via SUBCUTANEOUS
  Administered 2016-01-23: 1 [IU] via SUBCUTANEOUS

## 2016-01-21 MED ORDER — INSULIN DETEMIR 100 UNIT/ML ~~LOC~~ SOLN
6.0000 [IU] | Freq: Every day | SUBCUTANEOUS | Status: DC
  Administered 2016-01-22 – 2016-01-23 (×2): 6 [IU] via SUBCUTANEOUS
  Filled 2016-01-21 (×2): qty 0.06

## 2016-01-21 MED ORDER — INSULIN DETEMIR 100 UNIT/ML ~~LOC~~ SOLN
4.0000 [IU] | Freq: Every day | SUBCUTANEOUS | Status: DC
  Administered 2016-01-21 – 2016-01-22 (×2): 4 [IU] via SUBCUTANEOUS
  Filled 2016-01-21 (×3): qty 0.04

## 2016-01-21 MED ORDER — INSULIN ASPART 100 UNIT/ML ~~LOC~~ SOLN
2.0000 [IU] | Freq: Three times a day (TID) | SUBCUTANEOUS | Status: DC
  Administered 2016-01-21 – 2016-01-22 (×3): 2 [IU] via SUBCUTANEOUS

## 2016-01-21 NOTE — Progress Notes (Signed)
Inpatient Diabetes Program Recommendations  AACE/ADA: New Consensus Statement on Inpatient Glycemic Control (2015)  Target Ranges:  Prepandial:   less than 140 mg/dL      Peak postprandial:   less than 180 mg/dL (1-2 hours)      Critically ill patients:  140 - 180 mg/dL   Review of Glycemic Control  Inpatient Diabetes Program Recommendations:  Reviewed CBGs and spoke with Dr. Joseph ArtWoods regarding insulin regimen. Novolog correction changed to custom scale. starting at 151 mg/dL.   151-200 mg/dL- 1 unit [id number] mg/dL- 2 units [id number] mg/dL-3 units [id number] mg/dL-4 units [id number] mg/dL- 5 units Also changed meal coverage to 2 units tid if eats >50% and decrease in am Levemir to 6 units am and keep pm dose @ 4 units. Patient followed by Dr. Evlyn [place]annerSouth for endocrinology.  Thank you, Beth FischerJudy E. Jnyah Brazee, RN, MSN, CDE Inpatient Glycemic Control Team Team Pager [phone] (8am-5pm) 01/21/2016 1:58 PM

## 2016-01-21 NOTE — Progress Notes (Signed)
PROGRESS NOTE    Beth Payne  QIO:[id number] DOB: 12/31/1940 DOA: 01/19/2016 PCP: Julian HySOUTH,STEPHEN ALAN, MD   Brief Narrative:  75 y.o. BF PMHx Dementia (waxes and wanes), Depression, Anxiety, TIA, DM type 1 uncontrolled with Complications, MI CAD, Chronic Pain syndrome, Fibromyalgia, GERD.   Level V caveat applies as patient has dementia and is unable to provide reliable history at this time.  Upon my exam patient is providing differing details into her recent ankle history. She states both that her blood sugar has been elevated for several weeks but then changes her story and says it was only elevated for the last 1-2 days above 500. States that she does have nausea and abdominal pain but denies other symptoms such as fevers, back pain, chest pain, shortness of breath, cough. When asked about her medications and what was done at the nursing home to reverse her high sugar patient states "I guess nothing. "   Subjective: 8/16 A/O 3 (does not know when). NAD   Assessment & Plan:   Active Problems:   GERD (gastroesophageal reflux disease)   Dyslipidemia   Diabetic ketoacidosis without coma associated with type 1 diabetes mellitus (HCC)   AKI (acute kidney injury) (HCC)   Dementia   Protein-calorie malnutrition, severe   DKA (diabetic ketoacidoses) (HCC)   DKA, type 1 (HCC)   Diabetic gastroparesis (HCC)   Other specified hypothyroidism   DKA: History of type 1 diabetes -Multiple episodes of DKA: Per review of chart patient has been admitted 4 times since January 2017.   -will need to ensure that SNF checks patient's FSBS QAC/QHS at a minimum  -Decrease Levemir 4 units QHS, 6 units daily -NovoLog 2 units QAC -Custom SSI -- Increase home Reglan 10mg  TID  Acute kidney failure:(Baseline 0.6) Lab Results  Component Value Date   CREATININE 0.61 01/20/2016   CREATININE 0.73 01/20/2016   CREATININE 0.72 01/19/2016  -At baseline  Hypothyroid: - Synthroid 50 g daily -TSH  pending   Depression/Anxiety: - continue Ativan, zoloft  Chronic pain: - contine tramadol  HTN: - amlodipine 10 mg daily -Prazosin 1 mg QHS   Protein calorie malnutrition: - continue prostat and ensure   Dementia: at baseline - continue abilify, lamictal, aricept  HLD: - Continue Crestor 5 mg daily -Lipid panel pending  Hypokalemia    DVT prophylaxis: Subcutaneous heparin Code Status: DNR - confirmed w/ Pts POA  [name] Loletha CarrowBruner. DNR form signed at bedside since 11/2015.  Family Communication: none Disposition Plan: DC on 8/17   Consultants:  None  Procedures/Significant Events:  None  Cultures None  Antimicrobials: None   Devices None   LINES / TUBES:  None    Continuous Infusions: . sodium chloride 75 mL/hr at 01/20/16 1900     Objective: Vitals:   01/21/16 0357 01/21/16 0400 01/21/16 0600 01/21/16 0734  BP:  (!) 123/59 (!) 140/53 (!) 118/58  Pulse: 73 72 69 71  Resp: 13 (!) 0 16 13  Temp: 98.4 F (36.9 C)   97.9 F (36.6 C)  TempSrc: Oral   Oral  SpO2: 96% 96% 96% 97%  Weight:      Height:        Intake/Output Summary (Last 24 hours) at 01/21/16 0844 Last data filed at 01/21/16 0700  Gross per 24 hour  Intake             2050 ml  Output                0  ml  Net             2050 ml   Filed Weights   01/19/16 1006 01/19/16 1600  Weight: 44 kg (97 lb) 41.7 kg (91 lb 14.9 oz)    Examination:  General:A/O 3 (does not know when)., No acute respiratory distress Eyes: negative scleral hemorrhage, negative anisocoria, negative icterus ENT: Negative Runny nose, negative gingival bleeding, Neck:  Negative scars, masses, torticollis, lymphadenopathy, JVD Lungs: Clear to auscultation bilaterally without wheezes or crackles Cardiovascular: Regular rate and rhythm without murmur gallop or rub normal S1 and S2 Abdomen: negative abdominal pain, nondistended, positive soft, bowel sounds, no rebound, no ascites, no appreciable  mass Extremities: No significant cyanosis, clubbing, or edema bilateral lower extremities Skin: Negative rashes, lesions, ulcers Psychiatric:  Negative depression, negative anxiety, negative fatigue, negative mania  Central nervous system:  Cranial nerves II through XII intact, tongue/uvula midline, all extremities muscle strength 5/5, sensation intact throughout,  negative dysarthria, negative expressive aphasia, negative receptive aphasia.  .     Data Reviewed: Care during the described time interval was provided by me .  I have reviewed this patient's available data, including medical history, events of note, physical examination, and all test results as part of my evaluation. I have personally reviewed and interpreted all radiology studies.  CBC:  Recent Labs Lab 01/19/16 0946 01/19/16 1251 01/20/16 1332  WBC 7.3 14.2* 7.2  NEUTROABS 6.0  --   --   HGB 11.7* 11.6* 10.2*  HCT 36.9 36.4 31.3*  MCV 89.6 89.4 86.5  PLT 208 224 200   Basic Metabolic Panel:  Recent Labs Lab 01/19/16 0946 01/19/16 1251 01/19/16 1756 01/19/16 2148 01/20/16 0218 01/20/16 1332  NA 134* 137 135 139 137 137  K 4.4 4.0 3.7 3.5 3.2* 3.5  CL 99* 102 105 105 105 106  CO2 13* 13* 22 25 24 25   GLUCOSE 603* 461* 167* 138* 225* 219*  BUN 21* 22* 19 20 17 9   CREATININE 1.25* 1.27* 0.76 0.72 0.73 0.61  CALCIUM 10.4* 10.5* 10.3 10.6* 9.7 9.6  MG 1.9  --   --   --   --   --    GFR: Estimated Creatinine Clearance: 40.6 mL/min (by C-G formula based on SCr of 0.8 mg/dL). Liver Function Tests:  Recent Labs Lab 01/19/16 0946  AST 35  ALT 56*  ALKPHOS 107  BILITOT 1.6*  PROT 7.3  ALBUMIN 4.7   No results for input(s): LIPASE, AMYLASE in the last 168 hours. No results for input(s): AMMONIA in the last 168 hours. Coagulation Profile: No results for input(s): INR, PROTIME in the last 168 hours. Cardiac Enzymes: No results for input(s): CKTOTAL, CKMB, CKMBINDEX, TROPONINI in the last 168  hours. BNP (last 3 results) No results for input(s): PROBNP in the last 8760 hours. HbA1C: No results for input(s): HGBA1C in the last 72 hours. CBG:  Recent Labs Lab 01/20/16 2140 01/20/16 2353 01/21/16 0729 01/21/16 0732 01/21/16 0819  GLUCAP 388* 226* 33* 34* 98   Lipid Profile:  Recent Labs  01/20/16 2028  CHOL 150  HDL 63  LDLCALC 70  TRIG 84  CHOLHDL 2.4   Thyroid Function Tests:  Recent Labs  01/20/16 2027  TSH 1.398   Anemia Panel: No results for input(s): VITAMINB12, FOLATE, FERRITIN, TIBC, IRON, RETICCTPCT in the last 72 hours. Urine analysis:    Component Value Date/Time   COLORURINE YELLOW 12/13/2015 2026   APPEARANCEUR CLEAR 12/13/2015 2026   LABSPEC 1.031 (H)  12/13/2015 2026   PHURINE 5.0 12/13/2015 2026   GLUCOSEU >1000 (A) 12/13/2015 2026   HGBUR NEGATIVE 12/13/2015 2026   BILIRUBINUR NEGATIVE 12/13/2015 2026   KETONESUR 15 (A) 12/13/2015 2026   PROTEINUR NEGATIVE 12/13/2015 2026   UROBILINOGEN 0.2 02/23/2015 1433   NITRITE NEGATIVE 12/13/2015 2026   LEUKOCYTESUR NEGATIVE 12/13/2015 2026   Sepsis Labs: @LABRCNTIP (procalcitonin:4,lacticidven:4)  ) Recent Results (from the past 240 hour(s))  MRSA PCR Screening     Status: None   Collection Time: 01/19/16  4:10 PM  Result Value Ref Range Status   MRSA by PCR NEGATIVE NEGATIVE Final    Comment:        The GeneXpert MRSA Assay (FDA approved for NASAL specimens only), is one component of a comprehensive MRSA colonization surveillance program. It is not intended to diagnose MRSA infection nor to guide or monitor treatment for MRSA infections.          Radiology Studies: No results found.      Scheduled Meds: . amLODipine  10 mg Oral Daily  . ARIPiprazole  5 mg Oral Daily  . dextrose  25 mL Intravenous Once  . donepezil  10 mg Oral QHS  . feeding supplement (ENSURE ENLIVE)  237 mL Oral TID WC  . feeding supplement (PRO-STAT SUGAR FREE 64)  30 mL Oral BID WC  . heparin   5,000 Units Subcutaneous Q8H  . insulin aspart  0-9 Units Subcutaneous TID WC  . insulin aspart  5 Units Subcutaneous TID WC  . insulin detemir  6 Units Subcutaneous QHS  . insulin detemir  8 Units Subcutaneous Daily  . lamoTRIgine  50 mg Oral QHS  . levothyroxine  50 mcg Oral QAC breakfast  . LORazepam  0.5 mg Oral QHS  . metoCLOPramide (REGLAN) injection  10 mg Intravenous Q8H  . pantoprazole  40 mg Oral BID  . polyethylene glycol  17 g Oral Daily  . prazosin  1 mg Oral QHS  . rosuvastatin  5 mg Oral q1800  . sertraline  50 mg Oral TID  . sodium chloride flush  3 mL Intravenous Q12H   Continuous Infusions: . sodium chloride 75 mL/hr at 01/20/16 1900     LOS: 2 days    Time spent: 40 minutes    Kian Gamarra, Roselind Messier, MD [facility] Pager [phone]   If 7PM-7AM, please contact night-coverage [email or web address] Password [facility] 01/21/2016, 8:44 AM

## 2016-01-21 NOTE — Progress Notes (Signed)
CRITICAL VALUE ALERT  Critical value received: CBG= 33  Date of notification:  01-21-16  Time of notification: 0927  Critical value read back:Yes.    Nurse who received alert:  Corliss SkainsJuan Leslee Haueter RN  MD notified (1st page): Dr. Joseph ArtWoods  Time of first page: 0926  MD notified (2nd page):  Time of second page:  Responding MD: Dr. Joseph ArtWoods  Time MD responded: [phone]  Patient alert and following commands upon morning assessment. CBG was 33 patient hypoglycemia protocol followed and CBG recheck was 98.  Corliss SkainsJuan Hasaan Radde BSN RN

## 2016-01-22 DIAGNOSIS — G894 Chronic pain syndrome: Secondary | ICD-10-CM | POA: Diagnosis present

## 2016-01-22 DIAGNOSIS — F32A Depression, unspecified: Secondary | ICD-10-CM

## 2016-01-22 DIAGNOSIS — F411 Generalized anxiety disorder: Secondary | ICD-10-CM

## 2016-01-22 DIAGNOSIS — F329 Major depressive disorder, single episode, unspecified: Secondary | ICD-10-CM

## 2016-01-22 LAB — GLUCOSE, CAPILLARY
GLUCOSE-CAPILLARY: 243 mg/dL — AB (ref 65–99)
GLUCOSE-CAPILLARY: 286 mg/dL — AB (ref 65–99)
GLUCOSE-CAPILLARY: 288 mg/dL — AB (ref 65–99)
Glucose-Capillary: 375 mg/dL — ABNORMAL HIGH (ref 65–99)
Glucose-Capillary: 265 mg/dL — ABNORMAL HIGH (ref 65–99)

## 2016-01-22 MED ORDER — INSULIN ASPART 100 UNIT/ML ~~LOC~~ SOLN
3.0000 [IU] | Freq: Three times a day (TID) | SUBCUTANEOUS | Status: DC
  Administered 2016-01-22 – 2016-01-23 (×3): 3 [IU] via SUBCUTANEOUS

## 2016-01-22 NOTE — Progress Notes (Signed)
PROGRESS NOTE    Beth Payne  NWG:[id number] DOB: 11/05/1940 DOA: 01/19/2016 PCP: Julian HySOUTH,STEPHEN ALAN, MD   Brief Narrative:  75 y.o. BF PMHx Dementia (waxes and wanes), Depression, Anxiety, TIA, DM type 1 uncontrolled with Complications, MI CAD, Chronic Pain syndrome, Fibromyalgia, GERD.   Level V caveat applies as patient has dementia and is unable to provide reliable history at this time.  Upon my exam patient is providing differing details into her recent ankle history. She states both that her blood sugar has been elevated for several weeks but then changes her story and says it was only elevated for the last 1-2 days above 500. States that she does have nausea and abdominal pain but denies other symptoms such as fevers, back pain, chest pain, shortness of breath, cough. When asked about her medications and what was done at the nursing home to reverse her high sugar patient states "I guess nothing. "   Subjective: 8/17 A/O 3 (does not know when). NAD   Assessment & Plan:   Active Problems:   GERD (gastroesophageal reflux disease)   Dyslipidemia   Diabetic ketoacidosis without coma associated with type 1 diabetes mellitus (HCC)   AKI (acute kidney injury) (HCC)   Dementia   Protein-calorie malnutrition, severe   DKA (diabetic ketoacidoses) (HCC)   DKA, type 1 (HCC)   Diabetic gastroparesis (HCC)   Other specified hypothyroidism   DKA: History of type 1 diabetes -Multiple episodes of DKA: Per review of chart patient has been admitted 4 times since January 2017.   -will need to ensure that SNF checks patient's FSBS QAC/QHS at a minimum  -Levemir 4 units QHS, 6 units daily -NovoLog 3 units QAC -Custom SSI - Increase home Reglan 10mg  TID  Acute kidney failure:(Baseline 0.6) Lab Results  Component Value Date   CREATININE 0.61 01/20/2016   CREATININE 0.73 01/20/2016   CREATININE 0.72 01/19/2016  -At baseline  Hypothyroid: - Synthroid 50 g daily -TSH: WNL    Depression/Anxiety: - Ativan 0.5 mg QHS -Zoloft 50 mg TID  Chronic pain: - contine tramadol  HTN: - amlodipine 10 mg daily -Prazosin 1 mg QHS   Protein calorie malnutrition: - continue prostat and ensure   Dementia: at baseline - continue abilify, lamictal, aricept  HLD: - Continue Crestor 5 mg daily -Lipid panel pending  Hypokalemia    DVT prophylaxis: Subcutaneous heparin Code Status: DNR - confirmed w/ Pts POA  [name] Loletha CarrowBruner. DNR form signed at bedside since 11/2015.  Family Communication: none Disposition Plan: DC on 8/18   Consultants:  None  Procedures/Significant Events:  None  Cultures None  Antimicrobials: None   Devices None   LINES / TUBES:  None    Continuous Infusions: . sodium chloride 75 mL/hr at 01/22/16 0558     Objective: Vitals:   01/22/16 0730 01/22/16 0753 01/22/16 1140 01/22/16 1633  BP: 116/61 116/61 (!) 116/55 120/78  Pulse: 70 67 75 78  Resp: 12 15 20 20   Temp:  97.3 F (36.3 C) 97.5 F (36.4 C) 97.7 F (36.5 C)  TempSrc:  Oral Oral Oral  SpO2: 96% 97% 97% 97%  Weight:      Height:        Intake/Output Summary (Last 24 hours) at 01/22/16 1743 Last data filed at 01/22/16 1700  Gross per 24 hour  Intake             2325 ml  Output  0 ml  Net             2325 ml   Filed Weights   01/19/16 1006 01/19/16 1600  Weight: 44 kg (97 lb) 41.7 kg (91 lb 14.9 oz)    Examination:  General:A/O 3 (does not know when)., No acute respiratory distress Eyes: negative scleral hemorrhage, negative anisocoria, negative icterus ENT: Negative Runny nose, negative gingival bleeding, Neck:  Negative scars, masses, torticollis, lymphadenopathy, JVD Lungs: Clear to auscultation bilaterally without wheezes or crackles Cardiovascular: Regular rate and rhythm without murmur gallop or rub normal S1 and S2 Abdomen: negative abdominal pain, nondistended, positive soft, bowel sounds, no rebound, no ascites, no  appreciable mass Extremities: No significant cyanosis, clubbing, or edema bilateral lower extremities Skin: Negative rashes, lesions, ulcers Psychiatric:  Negative depression, negative anxiety, negative fatigue, negative mania  Central nervous system:  Cranial nerves II through XII intact, tongue/uvula midline, all extremities muscle strength 5/5, sensation intact throughout,  negative dysarthria, negative expressive aphasia, negative receptive aphasia.  .     Data Reviewed: Care during the described time interval was provided by me .  I have reviewed this patient's available data, including medical history, events of note, physical examination, and all test results as part of my evaluation. I have personally reviewed and interpreted all radiology studies.  CBC:  Recent Labs Lab 01/19/16 0946 01/19/16 1251 01/20/16 1332  WBC 7.3 14.2* 7.2  NEUTROABS 6.0  --   --   HGB 11.7* 11.6* 10.2*  HCT 36.9 36.4 31.3*  MCV 89.6 89.4 86.5  PLT 208 224 200   Basic Metabolic Panel:  Recent Labs Lab 01/19/16 0946 01/19/16 1251 01/19/16 1756 01/19/16 2148 01/20/16 0218 01/20/16 1332  NA 134* 137 135 139 137 137  K 4.4 4.0 3.7 3.5 3.2* 3.5  CL 99* 102 105 105 105 106  CO2 13* 13* 22 25 24 25   GLUCOSE 603* 461* 167* 138* 225* 219*  BUN 21* 22* 19 20 17 9   CREATININE 1.25* 1.27* 0.76 0.72 0.73 0.61  CALCIUM 10.4* 10.5* 10.3 10.6* 9.7 9.6  MG 1.9  --   --   --   --   --    GFR: Estimated Creatinine Clearance: 40.6 mL/min (by C-G formula based on SCr of 0.8 mg/dL). Liver Function Tests:  Recent Labs Lab 01/19/16 0946  AST 35  ALT 56*  ALKPHOS 107  BILITOT 1.6*  PROT 7.3  ALBUMIN 4.7   No results for input(s): LIPASE, AMYLASE in the last 168 hours. No results for input(s): AMMONIA in the last 168 hours. Coagulation Profile: No results for input(s): INR, PROTIME in the last 168 hours. Cardiac Enzymes: No results for input(s): CKTOTAL, CKMB, CKMBINDEX, TROPONINI in the last  168 hours. BNP (last 3 results) No results for input(s): PROBNP in the last 8760 hours. HbA1C: No results for input(s): HGBA1C in the last 72 hours. CBG:  Recent Labs Lab 01/21/16 2135 01/22/16 0340 01/22/16 0753 01/22/16 1133 01/22/16 1632  GLUCAP 353* 375* 265* 243* 286*   Lipid Profile:  Recent Labs  01/20/16 2028  CHOL 150  HDL 63  LDLCALC 70  TRIG 84  CHOLHDL 2.4   Thyroid Function Tests:  Recent Labs  01/20/16 2027  TSH 1.398   Anemia Panel: No results for input(s): VITAMINB12, FOLATE, FERRITIN, TIBC, IRON, RETICCTPCT in the last 72 hours. Urine analysis:    Component Value Date/Time   COLORURINE YELLOW 12/13/2015 2026   APPEARANCEUR CLEAR 12/13/2015 2026   LABSPEC 1.031 (  H) 12/13/2015 2026   PHURINE 5.0 12/13/2015 2026   GLUCOSEU >1000 (A) 12/13/2015 2026   HGBUR NEGATIVE 12/13/2015 2026   BILIRUBINUR NEGATIVE 12/13/2015 2026   KETONESUR 15 (A) 12/13/2015 2026   PROTEINUR NEGATIVE 12/13/2015 2026   UROBILINOGEN 0.2 02/23/2015 1433   NITRITE NEGATIVE 12/13/2015 2026   LEUKOCYTESUR NEGATIVE 12/13/2015 2026   Sepsis Labs: @LABRCNTIP (procalcitonin:4,lacticidven:4)  ) Recent Results (from the past 240 hour(s))  MRSA PCR Screening     Status: None   Collection Time: 01/19/16  4:10 PM  Result Value Ref Range Status   MRSA by PCR NEGATIVE NEGATIVE Final    Comment:        The GeneXpert MRSA Assay (FDA approved for NASAL specimens only), is one component of a comprehensive MRSA colonization surveillance program. It is not intended to diagnose MRSA infection nor to guide or monitor treatment for MRSA infections.          Radiology Studies: No results found.      Scheduled Meds: . amLODipine  10 mg Oral Daily  . ARIPiprazole  5 mg Oral Daily  . dextrose  25 mL Intravenous Once  . donepezil  10 mg Oral QHS  . feeding supplement (ENSURE ENLIVE)  237 mL Oral TID WC  . feeding supplement (PRO-STAT SUGAR FREE 64)  30 mL Oral BID WC  .  heparin  5,000 Units Subcutaneous Q8H  . insulin aspart  0-5 Units Subcutaneous TID WC  . insulin aspart  3 Units Subcutaneous TID WC  . insulin detemir  4 Units Subcutaneous QHS  . insulin detemir  6 Units Subcutaneous Daily  . lamoTRIgine  50 mg Oral QHS  . levothyroxine  50 mcg Oral QAC breakfast  . LORazepam  0.5 mg Oral QHS  . metoCLOPramide (REGLAN) injection  10 mg Intravenous Q8H  . pantoprazole  40 mg Oral BID  . polyethylene glycol  17 g Oral Daily  . prazosin  1 mg Oral QHS  . rosuvastatin  5 mg Oral q1800  . sertraline  50 mg Oral TID  . sodium chloride flush  3 mL Intravenous Q12H   Continuous Infusions: . sodium chloride 75 mL/hr at 01/22/16 0558     LOS: 3 days    Time spent: 40 minutes    WOODS, Roselind MessierURTIS J, MD [facility] Pager [phone]   If 7PM-7AM, please contact night-coverage [email or web address] Password [email or web address] 01/22/2016, 5:43 PM

## 2016-01-23 DIAGNOSIS — L89152 Pressure ulcer of sacral region, stage 2: Secondary | ICD-10-CM | POA: Diagnosis not present

## 2016-01-23 DIAGNOSIS — I10 Essential (primary) hypertension: Secondary | ICD-10-CM | POA: Diagnosis not present

## 2016-01-23 DIAGNOSIS — E1039 Type 1 diabetes mellitus with other diabetic ophthalmic complication: Secondary | ICD-10-CM | POA: Diagnosis not present

## 2016-01-23 DIAGNOSIS — R1084 Generalized abdominal pain: Secondary | ICD-10-CM | POA: Diagnosis not present

## 2016-01-23 DIAGNOSIS — K3184 Gastroparesis: Secondary | ICD-10-CM

## 2016-01-23 DIAGNOSIS — E1010 Type 1 diabetes mellitus with ketoacidosis without coma: Principal | ICD-10-CM

## 2016-01-23 DIAGNOSIS — K219 Gastro-esophageal reflux disease without esophagitis: Secondary | ICD-10-CM

## 2016-01-23 DIAGNOSIS — E1029 Type 1 diabetes mellitus with other diabetic kidney complication: Secondary | ICD-10-CM | POA: Diagnosis not present

## 2016-01-23 DIAGNOSIS — G9349 Other encephalopathy: Secondary | ICD-10-CM | POA: Diagnosis not present

## 2016-01-23 DIAGNOSIS — N179 Acute kidney failure, unspecified: Secondary | ICD-10-CM

## 2016-01-23 DIAGNOSIS — E784 Other hyperlipidemia: Secondary | ICD-10-CM | POA: Diagnosis not present

## 2016-01-23 DIAGNOSIS — E43 Unspecified severe protein-calorie malnutrition: Secondary | ICD-10-CM

## 2016-01-23 DIAGNOSIS — E871 Hypo-osmolality and hyponatremia: Secondary | ICD-10-CM | POA: Diagnosis not present

## 2016-01-23 DIAGNOSIS — F0390 Unspecified dementia without behavioral disturbance: Secondary | ICD-10-CM | POA: Diagnosis not present

## 2016-01-23 DIAGNOSIS — D649 Anemia, unspecified: Secondary | ICD-10-CM | POA: Diagnosis not present

## 2016-01-23 DIAGNOSIS — E1143 Type 2 diabetes mellitus with diabetic autonomic (poly)neuropathy: Secondary | ICD-10-CM

## 2016-01-23 DIAGNOSIS — E441 Mild protein-calorie malnutrition: Secondary | ICD-10-CM | POA: Diagnosis not present

## 2016-01-23 DIAGNOSIS — G894 Chronic pain syndrome: Secondary | ICD-10-CM

## 2016-01-23 LAB — GLUCOSE, CAPILLARY
GLUCOSE-CAPILLARY: 164 mg/dL — AB (ref 65–99)
GLUCOSE-CAPILLARY: 210 mg/dL — AB (ref 65–99)

## 2016-01-23 MED ORDER — INSULIN ASPART 100 UNIT/ML ~~LOC~~ SOLN: 3.0000 [IU] | Freq: Three times a day (TID) | SUBCUTANEOUS | 11 refills | Status: DC

## 2016-01-23 MED ORDER — INSULIN DETEMIR 100 UNIT/ML ~~LOC~~ SOLN
6.0000 [IU] | Freq: Every day | SUBCUTANEOUS | Status: DC
  Filled 2016-01-23: qty 0.06

## 2016-01-23 MED ORDER — INSULIN DETEMIR 100 UNIT/ML ~~LOC~~ SOLN: 8.0000 [IU] | Freq: Every day | SUBCUTANEOUS | Status: DC

## 2016-01-23 NOTE — Discharge Instructions (Signed)
Diabetic Ketoacidosis °Diabetic ketoacidosis is a life-threatening complication of diabetes. If it is not treated, it can cause severe dehydration and organ damage and can lead to a coma or death. °CAUSES °This condition develops when there is not enough of the hormone insulin in the body. Insulin helps the body to break down sugar for energy. Without insulin, the body cannot break down sugar, so it breaks down fats instead. This leads to the production of acids that are called ketones. Ketones are poisonous at high levels. °This condition can be triggered by: °· Stress on the body that is brought on by an illness. °· Medicines that raise blood glucose levels. °· Not taking diabetes medicine. °SYMPTOMS °Symptoms of this condition include: °· Fatigue. °· Weight loss. °· Excessive thirst. °· Light-headedness. °· Fruity or sweet-smelling breath. °· Excessive urination. °· Vision changes. °· Confusion or irritability. °· Nausea. °· Vomiting. °· Rapid breathing. °· Abdominal pain. °· Feeling flushed. °DIAGNOSIS °This condition is diagnosed based on a medical history, a physical exam, and blood tests. You may also have a urine test that checks for ketones. °TREATMENT °This condition may be treated with: °· Fluid replacement. This may be done to correct dehydration. °· Insulin injections. These may be given through the skin or through an IV tube. °· Electrolyte replacement. Electrolytes, such as potassium and sodium, may be given in pill form or through an IV tube. °· Antibiotic medicines. These may be prescribed if your condition was caused by an infection. °HOME CARE INSTRUCTIONS °Eating and Drinking °· Drink enough fluids to keep your urine clear or pale yellow. °· If you cannot eat, alternate between drinking fluids with sugar (such as juice) and salty fluids (such as broth or bouillon). °· If you can eat, follow your usual diet and drink sugar-free liquids, such as water. °Other Instructions °· Take insulin as  directed by your health care provider. Do not skip insulin injections. Do not use expired insulin. °· If your blood sugar is over 240 mg/dL, monitor your urine ketones every 4-6 hours. °· If you were prescribed an antibiotic medicine, finish all of it even if you start to feel better. °· Rest and exercise only as directed by your health care provider. °· If you get sick, call your health care provider and begin treatment quickly. Your body often needs extra insulin to fight an illness. °· Check your blood glucose levels regularly. If your blood glucose is high, drink plenty of fluids. This helps to flush out ketones. °SEEK MEDICAL CARE IF: °· Your blood glucose level is too high or too low. °· You have ketones in your urine. °· You have a fever. °· You cannot eat. °· You cannot tolerate fluids. °· You have been vomiting for more than 2 hours. °· You continue to have symptoms of this condition. °· You develop new symptoms. °SEEK IMMEDIATE MEDICAL CARE IF: °· Your blood glucose levels continue to be high (elevated). °· Your monitor reads "high" even when you are taking insulin. °· You faint. °· You have chest pain. °· You have trouble breathing. °· You have a sudden, severe headache. °· You have sudden weakness in one arm or one leg. °· You have sudden trouble speaking or swallowing. °· You have vomiting or diarrhea that gets worse after 3 hours. °· You feel severely fatigued. °· You have trouble thinking. °· You have abdominal pain. °· You are severely dehydrated. Symptoms of severe dehydration include: °¨ Extreme thirst. °¨ Dry mouth. °¨ Blue lips. °¨   Cold hands and feet. °¨ Rapid breathing. °  °This information is not intended to replace advice given to you by your health care provider. Make sure you discuss any questions you have with your health care provider. °  °Document Released: 05/21/2000 Document Revised: 10/08/2014 Document Reviewed: 05/01/2014 °[employer] Interactive Patient Education ©2016 [employer] ° °

## 2016-01-23 NOTE — Progress Notes (Signed)
Initial Nutrition Assessment  DOCUMENTATION CODES:   Underweight, Severe malnutrition in context of chronic illness  INTERVENTION:   -Continue Ensure Enlive po TID with meals, each supplement provides 350 kcal and 20 grams of protein -D/c 30 ml Prostat BID due to poor acceptance  NUTRITION DIAGNOSIS:   Malnutrition related to chronic illness as evidenced by moderate depletion of body fat, severe depletion of body fat, moderate depletions of muscle mass, severe depletion of muscle mass.  GOAL:   Patient will meet greater than or equal to 90% of their needs  MONITOR:   PO intake, Supplement acceptance, Labs, Weight trends, Skin, I & O's  REASON FOR ASSESSMENT:   Rounds    ASSESSMENT:   Beth Payne is a 75 y.o. female with medical history significant of type 1 diabetes, MI, TIA, chronic pain, CAD, dementia, fibromyalgia, GERD.   Pt admitted with DKA.   RD drawn to chart, due to page from RN regarding recommendations for consistent hyperglycemia and recommendations for supplements.   Pt is familiar to this RD due to multiple previous admissions for DKA. Pt with type 1 DM, which is historically quite challenging to control. Reviewed DM coordinator note from 01/21/16; pt has been transitioned to custom sliding scale and meal correction scale if pt consumes more than 50% of meal.   Reviewed records from [facility]; pt received Ensure Enlive TID with meals and 30 ml Prostat BID.   Spoke with pt at bedside, who was very flat during encounter (more so than during previous interactions with this RD). Pt reports appetite is fair PTA, estimating she consumes about 50% of her meals. Pt also confirms taking Ensure (2-3 times daily) and Prostat BID. Noted that ps is accepting Ensure supplements, however, has been refusing Prostat supplements. When this RD probed about refusal of Prostat, pt reported "I don't know".   Wt hx reviewed. Noted hx of weight loss, however, pt has gained  weight since past admission. No weight changes significant over the past year.   Nutrition-Focused physical exam completed. Findings are moderate to severe fat depletion, moderate to severe muscle depletion, and no edema.   Case discussed with RN. She confirms that pt has no skin breakdown at this time. She confirms that pt generally eats very little (a few bites of eggs and toast, and bacon on breakfast tray). Pt is consuming Ensure supplements well, but RN confirms that she is refusing Prostat. Discussed available supplements on formulary. Due to pt's consistent poor intake and malnutrition, recommend continue Ensure due to increased nutrient density. (Ensure Enlive TID would provide 1050 kcals and 60 grams protein (with 132 grams carbs daily vs Glucerna TID, which would provide 660 kcals, 30 grams protein (with 78 grams carbs daily). If pt's intake does improve, consider providing Ensure only if pt consumes greater than 50% of meal.   Labs reviewed: CBGS: 164-286. Noted CBGS appear to be trending down.   Diet Order:  Diet Carb Modified Fluid consistency: Thin; Room service appropriate? Yes Diet Carb Modified  Skin:  Reviewed, no issues  Last BM:  01/22/16  Height:   Ht Readings from Last 1 Encounters:  01/19/16 5\' 3"  (1.6 m)    Weight:   Wt Readings from Last 1 Encounters:  01/23/16 98 lb 12.3 oz (44.8 kg)    Ideal Body Weight:  52.3 kg  BMI:  Body mass index is 17.5 kg/m.  Estimated Nutritional Needs:   Kcal:  1350-1550  Protein:  55-70 grams  Fluid:  >1.3  L  EDUCATION NEEDS:   Education needs no appropriate at this time  Beth Payne, RD, LDN, CDE Pager: [phone] After hours Pager: [phone]

## 2016-01-23 NOTE — Discharge Summary (Signed)
DISCHARGE SUMMARY  Beth Payne  MR#: [id number]  DOB:03/21/1941  Date of Admission: 01/19/2016 Date of Discharge: 01/23/2016  Attending Physician:Joua Bake T  Patient's WGN:FAOZH,YQMVHQIPCP:SOUTH,STEPHEN Hessie DienerALAN, MD  Consults:  none  Disposition: D/C to SNF   Follow-up Appts: Follow-up Information    Julian HySOUTH,STEPHEN ALAN, MD .   Specialty:  Endocrinology Why:  Your doctor will see you at your skilled nursing facility.     Contact information: [address] [place] [address] [phone]           Tests Needing Follow-up: -Close monitoring of her CBGs a minimum of 4 times a day is indicated -Close monitoring of her diet to assure she is adhering to a diabetic appropriate diet is indicated  Discharge Diagnoses: DKA in uncontrolled DM1 Acute kidney failure Hypothyroid Depression/Anxiety Chronic pain HTN Protein calorie malnutrition Dementia: at baseline HLD Hypokalemia   Initial presentation: 75 y.o.F Hx Dementia, Depression, Anxiety, TIA, DM 1 w/ recurrent DKA, CAD w/ MI, Chronic Pain syndrome, Fibromyalgia, and GERD who [facility] because her sugars had been reading critical high and she developed nausea and vomiting.   Hospital Course:  DKA in uncontrolled DM1 -Multiple episodes of DKA - has been admitted 4 times since January 2017 -Rapidly corrected with IV insulin regimen -Returned to baseline insulin regimen at time of discharge w/ addition of scheduled meal coverage insulin  -CBGs not ideal at time of discharge but improved to point of stability - further titration of insulin regimen in the outpatient setting will be required  Acute kidney failure -baseline 0.6 - creatinine has returned to baseline with resolution of DKA/hydration  Recent Labs Lab 01/19/16 1251 01/19/16 1756 01/19/16 2148 01/20/16 0218 01/20/16 1332  CREATININE 1.27* 0.76 0.72 0.73 0.61    Hypothyroid -TSH WNL - no adjustment in medication  dosing  Depression/Anxiety -Continue usual outpatient regimen without change  Chronic pain -Continue usual outpatient regimen without change  HTN -blood pressure well controlled at time of discharge  Protein calorie malnutrition - continue prostat and ensure   Dementia: at baseline -continue abilify, lamictal, aricept  HLD -LDL 70 this admit / HDL 63- continue Crestor 5 mg daily  Hypokalemia -Resolved with supplementation and discontinuation of IV insulin    Medication List    TAKE these medications   amLODipine 10 MG tablet Commonly known as:  NORVASC Take 1 tablet (10 mg total) by mouth daily.   ARIPiprazole 5 MG tablet Commonly known as:  ABILIFY Take 1 tablet (5 mg total) by mouth every morning. What changed:  when to take this   bisacodyl 5 MG EC tablet Commonly known as:  DULCOLAX Take 10 mg by mouth daily as needed for mild constipation or moderate constipation.   cholecalciferol 1000 units tablet Commonly known as:  VITAMIN D Take 2,000 Units by mouth daily.   donepezil 10 MG tablet Commonly known as:  ARICEPT Take 10 mg by mouth at bedtime.   feeding supplement (ENSURE ENLIVE) Liqd Take 237 mLs by mouth 3 (three) times daily with meals. What changed:  Another medication with the same name was removed. Continue taking this medication, and follow the directions you see here.   feeding supplement (PRO-STAT SUGAR FREE 64) Liqd Take 30 mLs by mouth 2 (two) times daily with a meal.   ferrous sulfate 325 (65 FE) MG tablet Take 325 mg by mouth 2 (two) times daily. 8am, 4pm   insulin aspart 100 UNIT/ML injection Commonly known as:  novoLOG Inject  at bedtime based upon sugars.  Sugar 0-120--no units; 121-150--1 unit;  151-200--2 units;  201-250--3 units; 251-300--5 units;  301-350--7 units;  351-400--9 units What changed:  how much to take  how to take this  when to take this  additional instructions   insulin aspart 100 UNIT/ML  injection Commonly known as:  novoLOG Inject before each meal based upon sugars.  Sugar 0-200--no units;  201-250--2 units; 251-300--3 units; 301-350--4 units;  351-400--5 units What changed:  Another medication with the same name was added. Make sure you understand how and when to take each.  Another medication with the same name was changed. Make sure you understand how and when to take each.   insulin aspart 100 UNIT/ML injection Commonly known as:  novoLOG Inject 3 Units into the skin 3 (three) times daily with meals. What changed:  You were already taking a medication with the same name, and this prescription was added. Make sure you understand how and when to take each.   insulin detemir 100 UNIT/ML injection Commonly known as:  LEVEMIR Inject 6-8 Units into the skin See admin instructions. Inject 8 units subcutaneously every morning (8am) and 6 units every evening (5pm)   lamoTRIgine 25 MG tablet Commonly known as:  LAMICTAL Take 50 mg by mouth at bedtime.   levothyroxine 50 MCG tablet Commonly known as:  SYNTHROID, LEVOTHROID Take 50 mcg by mouth daily before breakfast.   LORazepam 0.5 MG tablet Commonly known as:  ATIVAN Take 1 tablet (0.5 mg total) by mouth at bedtime. Anxiety   metoCLOPramide 5 MG tablet Commonly known as:  REGLAN Take 1 tablet (5 mg total) by mouth 3 (three) times daily before meals. What changed:  additional instructions   pantoprazole 40 MG tablet Commonly known as:  PROTONIX Take 1 tablet (40 mg total) by mouth 2 (two) times daily. What changed:  additional instructions   polyethylene glycol packet Commonly known as:  MIRALAX / GLYCOLAX Take 17 g by mouth daily. What changed:  additional instructions   prazosin 1 MG capsule Commonly known as:  MINIPRESS Take 1 mg by mouth at bedtime.   rosuvastatin 5 MG tablet Commonly known as:  CRESTOR Take 5 mg by mouth daily at 6 PM.   sertraline 50 MG tablet Commonly known as:  ZOLOFT Take 50  mg by mouth 3 (three) times daily. 6am, 2pm, 10pm   traMADol 50 MG tablet Commonly known as:  ULTRAM Take 1 tablet (50 mg total) by mouth every 6 (six) hours as needed for moderate pain.       Day of Discharge BP (!) 110/56 (BP Location: Right Arm)   Pulse 69   Temp 98.4 F (36.9 C) (Oral)   Resp 15   Ht 5\' 3"  (1.6 m)   Wt 44.8 kg (98 lb 12.3 oz)   SpO2 95%   BMI 17.50 kg/m   Physical Exam: General: No acute respiratory distress Lungs: Clear to auscultation bilaterally without wheezes or crackles Cardiovascular: Regular rate and rhythm without murmur gallop or rub normal S1 and S2 Abdomen: Nontender, nondistended, soft, bowel sounds positive, no rebound, no ascites, no appreciable mass Extremities: No significant cyanosis, clubbing, or edema bilateral lower extremities  Basic Metabolic Panel:  Recent Labs Lab 01/19/16 0946 01/19/16 1251 01/19/16 1756 01/19/16 2148 01/20/16 0218 01/20/16 1332  NA 134* 137 135 139 137 137  K 4.4 4.0 3.7 3.5 3.2* 3.5  CL 99* 102 105 105 105 106  CO2 13* 13* 22 25 24  25  GLUCOSE 603* 461* 167* 138* 225* 219*  BUN 21* 22* 19 20 17 9   CREATININE 1.25* 1.27* 0.76 0.72 0.73 0.61  CALCIUM 10.4* 10.5* 10.3 10.6* 9.7 9.6  MG 1.9  --   --   --   --   --     Liver Function Tests:  Recent Labs Lab 01/19/16 0946  AST 35  ALT 56*  ALKPHOS 107  BILITOT 1.6*  PROT 7.3  ALBUMIN 4.7    CBC:  Recent Labs Lab 01/19/16 0946 01/19/16 1251 01/20/16 1332  WBC 7.3 14.2* 7.2  NEUTROABS 6.0  --   --   HGB 11.7* 11.6* 10.2*  HCT 36.9 36.4 31.3*  MCV 89.6 89.4 86.5  PLT 208 224 200    CBG:  Recent Labs Lab 01/22/16 1133 01/22/16 1632 01/22/16 2106 01/23/16 0738 01/23/16 1134  GLUCAP 243* 286* 288* 164* 210*    Recent Results (from the past 240 hour(s))  MRSA PCR Screening     Status: None   Collection Time: 01/19/16  4:10 PM  Result Value Ref Range Status   MRSA by PCR NEGATIVE NEGATIVE Final    Comment:        The  GeneXpert MRSA Assay (FDA approved for NASAL specimens only), is one component of a comprehensive MRSA colonization surveillance program. It is not intended to diagnose MRSA infection nor to guide or monitor treatment for MRSA infections.      Time spent in discharge (includes decision making & examination of pt): >30 minutes  01/23/2016, 11:38 AM   Lonia BloodJeffrey T. Gustavia Carie, MD [facility] Office  [phone] Pager [phone]  On-Call/Text Page:      [email or web address]      password [email or web address]

## 2016-01-23 NOTE — Progress Notes (Signed)
Patient will DC to: [facility]  Anticipated DC date: 01/23/16 Family notified: Stanton Kidneyon, HCPOA Transport by: Sharin MonsPTAR 2:30pm   Per MD patient ready for DC to [facility]. RN, patient, patient's family, and facility notified of DC. RN given number for report. DC packet on chart. Ambulance transport requested for patient.   CSW signing off.  Cristobal GoldmannNadia Alisha Burgo, [place] Clinical Social Worker [phone]

## 2016-01-23 NOTE — Progress Notes (Signed)
Called report to SNF, spoke with Antinette. All questions answered.  Beth Payne. Joslyn Ramos, RN

## 2016-01-26 DIAGNOSIS — E1169 Type 2 diabetes mellitus with other specified complication: Secondary | ICD-10-CM | POA: Diagnosis not present

## 2016-01-26 DIAGNOSIS — E039 Hypothyroidism, unspecified: Secondary | ICD-10-CM | POA: Diagnosis not present

## 2016-01-26 DIAGNOSIS — I10 Essential (primary) hypertension: Secondary | ICD-10-CM | POA: Diagnosis not present

## 2016-01-26 DIAGNOSIS — E872 Acidosis: Secondary | ICD-10-CM | POA: Diagnosis not present

## 2016-01-26 DIAGNOSIS — E785 Hyperlipidemia, unspecified: Secondary | ICD-10-CM | POA: Diagnosis not present

## 2016-02-02 ENCOUNTER — Inpatient Hospital Stay (HOSPITAL_COMMUNITY): Payer: Medicare Other

## 2016-02-02 ENCOUNTER — Inpatient Hospital Stay (HOSPITAL_COMMUNITY)
Admission: EM | Admit: 2016-02-02 | Discharge: 2016-02-03 | DRG: 639 | Disposition: A | Discharge status: Skilled Nursing Facility | Payer: Medicare Other | Attending: Internal Medicine | Admitting: Internal Medicine

## 2016-02-02 ENCOUNTER — Encounter (HOSPITAL_COMMUNITY): Payer: Self-pay

## 2016-02-02 DIAGNOSIS — R7309 Other abnormal glucose: Secondary | ICD-10-CM | POA: Diagnosis not present

## 2016-02-02 DIAGNOSIS — I272 Other secondary pulmonary hypertension: Secondary | ICD-10-CM | POA: Diagnosis present

## 2016-02-02 DIAGNOSIS — Z87891 Personal history of nicotine dependence: Secondary | ICD-10-CM

## 2016-02-02 DIAGNOSIS — E10319 Type 1 diabetes mellitus with unspecified diabetic retinopathy without macular edema: Secondary | ICD-10-CM | POA: Diagnosis present

## 2016-02-02 DIAGNOSIS — Z886 Allergy status to analgesic agent status: Secondary | ICD-10-CM | POA: Diagnosis not present

## 2016-02-02 DIAGNOSIS — E1165 Type 2 diabetes mellitus with hyperglycemia: Secondary | ICD-10-CM | POA: Diagnosis not present

## 2016-02-02 DIAGNOSIS — K219 Gastro-esophageal reflux disease without esophagitis: Secondary | ICD-10-CM | POA: Diagnosis present

## 2016-02-02 DIAGNOSIS — Z794 Long term (current) use of insulin: Secondary | ICD-10-CM

## 2016-02-02 DIAGNOSIS — E785 Hyperlipidemia, unspecified: Secondary | ICD-10-CM | POA: Diagnosis not present

## 2016-02-02 DIAGNOSIS — Z90710 Acquired absence of both cervix and uterus: Secondary | ICD-10-CM

## 2016-02-02 DIAGNOSIS — F411 Generalized anxiety disorder: Secondary | ICD-10-CM | POA: Diagnosis present

## 2016-02-02 DIAGNOSIS — Z8673 Personal history of transient ischemic attack (TIA), and cerebral infarction without residual deficits: Secondary | ICD-10-CM

## 2016-02-02 DIAGNOSIS — D649 Anemia, unspecified: Secondary | ICD-10-CM | POA: Diagnosis not present

## 2016-02-02 DIAGNOSIS — I10 Essential (primary) hypertension: Secondary | ICD-10-CM | POA: Diagnosis present

## 2016-02-02 DIAGNOSIS — E1310 Other specified diabetes mellitus with ketoacidosis without coma: Secondary | ICD-10-CM | POA: Diagnosis not present

## 2016-02-02 DIAGNOSIS — Z79899 Other long term (current) drug therapy: Secondary | ICD-10-CM

## 2016-02-02 DIAGNOSIS — F329 Major depressive disorder, single episode, unspecified: Secondary | ICD-10-CM | POA: Diagnosis present

## 2016-02-02 DIAGNOSIS — Z955 Presence of coronary angioplasty implant and graft: Secondary | ICD-10-CM

## 2016-02-02 DIAGNOSIS — Z9841 Cataract extraction status, right eye: Secondary | ICD-10-CM | POA: Diagnosis not present

## 2016-02-02 DIAGNOSIS — I2510 Atherosclerotic heart disease of native coronary artery without angina pectoris: Secondary | ICD-10-CM | POA: Diagnosis not present

## 2016-02-02 DIAGNOSIS — E039 Hypothyroidism, unspecified: Secondary | ICD-10-CM | POA: Diagnosis present

## 2016-02-02 DIAGNOSIS — G894 Chronic pain syndrome: Secondary | ICD-10-CM | POA: Diagnosis present

## 2016-02-02 DIAGNOSIS — Z881 Allergy status to other antibiotic agents status: Secondary | ICD-10-CM

## 2016-02-02 DIAGNOSIS — Z9842 Cataract extraction status, left eye: Secondary | ICD-10-CM | POA: Diagnosis not present

## 2016-02-02 DIAGNOSIS — R011 Cardiac murmur, unspecified: Secondary | ICD-10-CM | POA: Diagnosis present

## 2016-02-02 DIAGNOSIS — Z961 Presence of intraocular lens: Secondary | ICD-10-CM | POA: Diagnosis present

## 2016-02-02 DIAGNOSIS — R0602 Shortness of breath: Secondary | ICD-10-CM

## 2016-02-02 DIAGNOSIS — E1110 Type 2 diabetes mellitus with ketoacidosis without coma: Secondary | ICD-10-CM | POA: Diagnosis present

## 2016-02-02 DIAGNOSIS — Z981 Arthrodesis status: Secondary | ICD-10-CM

## 2016-02-02 DIAGNOSIS — F0390 Unspecified dementia without behavioral disturbance: Secondary | ICD-10-CM | POA: Diagnosis not present

## 2016-02-02 DIAGNOSIS — M797 Fibromyalgia: Secondary | ICD-10-CM | POA: Diagnosis present

## 2016-02-02 DIAGNOSIS — I252 Old myocardial infarction: Secondary | ICD-10-CM | POA: Diagnosis not present

## 2016-02-02 DIAGNOSIS — E1051 Type 1 diabetes mellitus with diabetic peripheral angiopathy without gangrene: Secondary | ICD-10-CM | POA: Diagnosis present

## 2016-02-02 DIAGNOSIS — E1010 Type 1 diabetes mellitus with ketoacidosis without coma: Secondary | ICD-10-CM | POA: Diagnosis present

## 2016-02-02 DIAGNOSIS — Z885 Allergy status to narcotic agent status: Secondary | ICD-10-CM

## 2016-02-02 DIAGNOSIS — Z888 Allergy status to other drugs, medicaments and biological substances status: Secondary | ICD-10-CM

## 2016-02-02 LAB — BASIC METABOLIC PANEL
Anion gap: 24 — ABNORMAL HIGH (ref 5–15)
BUN: 27 mg/dL — ABNORMAL HIGH (ref 6–20)
CHLORIDE: 93 mmol/L — AB (ref 101–111)
CO2: 14 mmol/L — ABNORMAL LOW (ref 22–32)
CREATININE: 1.22 mg/dL — AB (ref 0.44–1.00)
Calcium: 10.1 mg/dL (ref 8.9–10.3)
GFR, EST AFRICAN AMERICAN: 49 mL/min — AB (ref >60)
GFR, EST NON AFRICAN AMERICAN: 43 mL/min — AB (ref >60)
Glucose, Bld: 746 mg/dL (ref 65–99)
POTASSIUM: 4.7 mmol/L (ref 3.5–5.1)
SODIUM: 131 mmol/L — AB (ref 135–145)

## 2016-02-02 LAB — URINE MICROSCOPIC-ADD ON
Bacteria, UA: NONE SEEN
RBC / HPF: NONE SEEN RBC/hpf (ref 0–5)
SQUAMOUS EPITHELIAL / LPF: NONE SEEN
WBC UA: NONE SEEN WBC/hpf (ref 0–5)

## 2016-02-02 LAB — URINALYSIS, ROUTINE W REFLEX MICROSCOPIC
Bilirubin Urine: NEGATIVE
Hgb urine dipstick: NEGATIVE
Ketones, ur: >80 mg/dL — AB
LEUKOCYTES UA: NEGATIVE
Nitrite: NEGATIVE
PH: 5 (ref 5.0–8.0)
Protein, ur: NEGATIVE mg/dL
Specific Gravity, Urine: 1.03 (ref 1.005–1.030)

## 2016-02-02 LAB — GLUCOSE, CAPILLARY
GLUCOSE-CAPILLARY: 392 mg/dL — AB (ref 65–99)
GLUCOSE-CAPILLARY: 264 mg/dL — AB (ref 65–99)
Glucose-Capillary: 318 mg/dL — ABNORMAL HIGH (ref 65–99)
Glucose-Capillary: 156 mg/dL — ABNORMAL HIGH (ref 65–99)

## 2016-02-02 LAB — CBG MONITORING, ED: Glucose-Capillary: >600 mg/dL (ref 65–99)

## 2016-02-02 LAB — I-STAT ARTERIAL BLOOD GAS, ED
Acid-base deficit: 11 mmol/L — ABNORMAL HIGH (ref 0.0–2.0)
BICARBONATE: 14.7 meq/L — AB (ref 20.0–24.0)
O2 Saturation: 95 %
PCO2 ART: 31.7 mmHg — AB (ref 35.0–45.0)
PO2 ART: 82 mmHg (ref 80.0–100.0)
Patient temperature: 98.6
TCO2: 16 mmol/L (ref 0–100)
pH, Arterial: 7.274 — ABNORMAL LOW (ref 7.350–7.450)

## 2016-02-02 LAB — CBC
HEMATOCRIT: 32.3 % — AB (ref 36.0–46.0)
Hemoglobin: 10.2 g/dL — ABNORMAL LOW (ref 12.0–15.0)
MCH: 28.4 pg (ref 26.0–34.0)
MCHC: 31.6 g/dL (ref 30.0–36.0)
MCV: 90 fL (ref 78.0–100.0)
Platelets: 254 10*3/uL (ref 150–400)
RBC: 3.59 MIL/uL — AB (ref 3.87–5.11)
RDW: 14.3 % (ref 11.5–15.5)
WBC: 9 10*3/uL (ref 4.0–10.5)

## 2016-02-02 MED ORDER — BISACODYL 5 MG PO TBEC: 10.0000 mg | DELAYED_RELEASE_TABLET | Freq: Every day | ORAL | Status: DC | PRN

## 2016-02-02 MED ORDER — SODIUM CHLORIDE 0.9 % IV BOLUS (SEPSIS)
1000.0000 mL | Freq: Once | INTRAVENOUS | Status: AC
  Administered 2016-02-02: 1000 mL via INTRAVENOUS

## 2016-02-02 MED ORDER — LEVOTHYROXINE SODIUM 50 MCG PO TABS
50.0000 ug | ORAL_TABLET | Freq: Every day | ORAL | Status: DC
  Administered 2016-02-03: 50 ug via ORAL
  Filled 2016-02-02: qty 1

## 2016-02-02 MED ORDER — DEXTROSE-NACL 5-0.45 % IV SOLN: INTRAVENOUS | Status: DC

## 2016-02-02 MED ORDER — ROSUVASTATIN CALCIUM 5 MG PO TABS
5.0000 mg | ORAL_TABLET | Freq: Every day | ORAL | Status: DC
  Administered 2016-02-02: 5 mg via ORAL
  Filled 2016-02-02 (×2): qty 1

## 2016-02-02 MED ORDER — LAMOTRIGINE 25 MG PO TABS
25.0000 mg | ORAL_TABLET | Freq: Every day | ORAL | Status: DC
  Administered 2016-02-02 – 2016-02-03 (×2): 25 mg via ORAL
  Filled 2016-02-02 (×2): qty 1

## 2016-02-02 MED ORDER — SODIUM CHLORIDE 0.9 % IV SOLN
INTRAVENOUS | Status: DC
  Administered 2016-02-02: 5.4 [IU]/h via INTRAVENOUS
  Administered 2016-02-02: 22:00:00 via INTRAVENOUS
  Filled 2016-02-02 (×2): qty 2.5

## 2016-02-02 MED ORDER — LORAZEPAM 1 MG PO TABS
0.5000 mg | ORAL_TABLET | Freq: Every day | ORAL | Status: DC
  Administered 2016-02-02: 0.5 mg via ORAL
  Filled 2016-02-02: qty 1

## 2016-02-02 MED ORDER — INSULIN DETEMIR 100 UNIT/ML ~~LOC~~ SOLN
10.0000 [IU] | Freq: Two times a day (BID) | SUBCUTANEOUS | Status: DC
  Administered 2016-02-02: 10 [IU] via SUBCUTANEOUS
  Filled 2016-02-02 (×2): qty 0.1

## 2016-02-02 MED ORDER — POLYETHYLENE GLYCOL 3350 17 G PO PACK
17.0000 g | PACK | Freq: Every day | ORAL | Status: DC
  Administered 2016-02-02: 17 g via ORAL
  Filled 2016-02-02: qty 1

## 2016-02-02 MED ORDER — METOCLOPRAMIDE HCL 10 MG PO TABS
5.0000 mg | ORAL_TABLET | Freq: Three times a day (TID) | ORAL | Status: DC
  Administered 2016-02-03 (×2): 5 mg via ORAL
  Filled 2016-02-02 (×2): qty 1

## 2016-02-02 MED ORDER — SERTRALINE HCL 50 MG PO TABS
50.0000 mg | ORAL_TABLET | Freq: Three times a day (TID) | ORAL | Status: DC
  Administered 2016-02-02 – 2016-02-03 (×3): 50 mg via ORAL
  Filled 2016-02-02 (×3): qty 1

## 2016-02-02 MED ORDER — DONEPEZIL HCL 10 MG PO TABS
10.0000 mg | ORAL_TABLET | Freq: Every day | ORAL | Status: DC
  Administered 2016-02-02: 10 mg via ORAL
  Filled 2016-02-02: qty 1

## 2016-02-02 MED ORDER — INSULIN ASPART 100 UNIT/ML ~~LOC~~ SOLN
6.0000 [IU] | Freq: Once | SUBCUTANEOUS | Status: AC
  Administered 2016-02-02: 6 [IU] via INTRAVENOUS
  Filled 2016-02-02: qty 1

## 2016-02-02 MED ORDER — SODIUM CHLORIDE 0.9 % IV SOLN
INTRAVENOUS | Status: DC
  Administered 2016-02-02: 1000 mL via INTRAVENOUS

## 2016-02-02 MED ORDER — DEXTROSE-NACL 5-0.45 % IV SOLN
INTRAVENOUS | Status: DC
  Administered 2016-02-02: 500 mL via INTRAVENOUS

## 2016-02-02 MED ORDER — SODIUM CHLORIDE 0.9 % IV SOLN
INTRAVENOUS | Status: DC
  Filled 2016-02-02: qty 2.5

## 2016-02-02 MED ORDER — SODIUM CHLORIDE 0.9 % IV SOLN: INTRAVENOUS | Status: DC

## 2016-02-02 MED ORDER — ENOXAPARIN SODIUM 30 MG/0.3ML ~~LOC~~ SOLN
30.0000 mg | SUBCUTANEOUS | Status: DC
  Administered 2016-02-02: 30 mg via SUBCUTANEOUS
  Filled 2016-02-02: qty 0.3

## 2016-02-02 MED ORDER — ARIPIPRAZOLE 5 MG PO TABS
5.0000 mg | ORAL_TABLET | Freq: Every day | ORAL | Status: DC
  Administered 2016-02-02 – 2016-02-03 (×2): 5 mg via ORAL
  Filled 2016-02-02 (×2): qty 1

## 2016-02-02 MED ORDER — AMLODIPINE BESYLATE 5 MG PO TABS
10.0000 mg | ORAL_TABLET | Freq: Every day | ORAL | Status: DC
  Administered 2016-02-02 – 2016-02-03 (×2): 10 mg via ORAL
  Filled 2016-02-02 (×2): qty 2

## 2016-02-02 NOTE — ED Provider Notes (Signed)
[facility]-EMERGENCY DEPT Provider Note   CSN: [id number] Arrival date & time: 02/02/16  1532     History   Chief Complaint Chief Complaint  Patient presents with  . Hyperglycemia    HPI Beth Payne is a 75 y.o. female.  The history is provided by the patient and medical records.  Hyperglycemia  Blood sugar level PTA:  High Severity:  Moderate Onset quality:  Gradual Timing:  Constant Progression:  Worsening Chronicity:  Recurrent Diabetes status:  Controlled with insulin Current diabetic therapy:  Unsure Context comment:  Unsure Relieved by:  None tried Ineffective treatments:  None tried Associated symptoms: increased thirst and weakness   Associated symptoms: no abdominal pain, no altered mental status, no confusion, no dehydration, no diaphoresis, no dizziness, no dysuria, no increased appetite, no shortness of breath and no syncope     Past Medical History:  Diagnosis Date  . Anxiety   . Arthritis    "fingers" (12/23/2014)  . Chronic lower back pain   . Coronary artery disease   . Dementia   . Depression   . Fibromyalgia   . Fracture, ankle 12/23/2014  . GERD (gastroesophageal reflux disease) dx'd 1995  . Heart murmur   . NSTEMI (non-ST elevated myocardial infarction) (HCC) 12/13/2014  . Osteopenia dx'd 2007  . Retinopathy   . TIA (transient ischemic attack)   . Type I diabetes mellitus (HCC) dx'd 1953   "I'm on a pup" (12/23/2014)    Patient Active Problem List   Diagnosis Date Noted  . DKA (diabetic ketoacidoses) (HCC) 02/02/2016  . DKA, type 1 (HCC) 02/02/2016  . Chronic pain syndrome   . Depression   . Anxiety state   . Other specified hypothyroidism   . Diabetic gastroparesis (HCC) 01/19/2016  . Transaminasemia   . Transaminitis 12/13/2015  . Hypomagnesemia 11/24/2015  . Protein-calorie malnutrition, severe 11/22/2015  . Pressure ulcer, stage I, non-blanching erythema 11/22/2015  . 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 Dementia 11/20/2015  . Abdominal  pain 11/20/2015  . Weakness generalized 05/20/2015  . Lung mass 05/20/2015  . Hypokalemia 04/24/2015  . Acute encephalopathy 01/01/2015  . Type 1 diabetes mellitus with peripheral circulatory complications (HCC) 12/17/2014  . CAD in native artery   . Pulmonary hypertension (HCC)   . GERD (gastroesophageal reflux disease) 12/12/2014  . CAD s/p stent to mid-RCA in 2001. 12/12/2014  . Osteoarthritis 12/12/2014  . Systolic murmur 12/12/2014  . Normocytic anemia 12/12/2014  . Dyslipidemia 12/12/2014    Past Surgical History:  Procedure Laterality Date  . ABDOMINAL HYSTERECTOMY  1972  . ANTERIOR CERVICAL DECOMP/DISCECTOMY FUSION  05/2003   "C5-6"  . APPENDECTOMY    . BACK SURGERY    . BILATERAL CARPAL TUNNEL RELEASE Bilateral 2003-2004  . BUNIONECTOMY WITH HAMMERTOE RECONSTRUCTION Right 02/1999  . CATARACT EXTRACTION W/ INTRAOCULAR LENS  IMPLANT, BILATERAL Bilateral 1998  . CERVICAL DISC SURGERY  2004   "collapsed C-5"  . CESAREAN SECTION    . CORONARY ANGIOPLASTY WITH STENT PLACEMENT  07/1999   "?1"  . DILATION AND CURETTAGE OF UTERUS    . EXPLORATORY LAPAROTOMY  1975-1987 X 1   "adhesions"  . EYE SURGERY Bilateral since 1977   "numerous slaser surgeries for retinopathy"  . FRACTURE SURGERY    . LAPAROSCOPIC LYSIS OF ADHESIONS  [phone] X 5  . LUMBAR MICRODISCECTOMY  03/2005  . OOPHORECTOMY Bilateral 1980's  . ORIF ANKLE FRACTURE Left 12/25/2014   Procedure: OPEN REDUCTION INTERNAL FIXATION (ORIF) LEFT TRIMALLEOLAR ANKLE FRACTURE;  Surgeon:  Tarry Kos, MD;  Location: [facility] OR;  Service: Orthopedics;  Laterality: Left;  . PATELLA FRACTURE SURGERY Left 04/2007  . TONSILLECTOMY    . TRIGGER FINGER RELEASE Bilateral 2003  . VERTEBROPLASTY  01/2008   "compressed lumbar fracture"  . VITRECTOMY Right 08/1997    OB History    No data available       Home Medications    Prior to Admission medications   Medication Sig Start Date End Date Taking? Authorizing Provider  Amino  Acids-Protein Hydrolys (FEEDING SUPPLEMENT, PRO-STAT SUGAR FREE 64,) LIQD Take 30 mLs by mouth 2 (two) times daily with a meal. 11/24/15  Yes Christina P Rama, MD  amLODipine (NORVASC) 10 MG tablet Take 1 tablet (10 mg total) by mouth daily. 05/22/15  Yes Richarda Overlie, MD  ARIPiprazole (ABILIFY) 5 MG tablet Take 1 tablet (5 mg total) by mouth every morning. Patient taking differently: Take 5 mg by mouth daily.  04/28/15  Yes Shanker Levora Dredge, MD  bisacodyl (DULCOLAX) 5 MG EC tablet Take 10 mg by mouth daily as needed for mild constipation or moderate constipation.   Yes Historical Provider, MD  cholecalciferol (VITAMIN D) 1000 units tablet Take 2,000 Units by mouth daily.   Yes Historical Provider, MD  donepezil (ARICEPT) 10 MG tablet Take 10 mg by mouth at bedtime.   Yes Historical Provider, MD  ferrous sulfate 325 (65 FE) MG tablet Take 325 mg by mouth 2 (two) times daily.    Yes Historical Provider, MD  insulin aspart (NOVOLOG) 100 UNIT/ML injection Inject at bedtime based upon sugars.  Sugar 0-120--no units; 121-150--1 unit;  151-200--2 units;  201-250--3 units; 251-300--5 units;  301-350--7 units;  351-400--9 units Patient taking differently: Inject 0-9 Units into the skin 3 (three) times daily with meals. Per sliding scale: BGL 121-150 = 1 unit; 151-200 = 2 units; 201-250 = 3 units; 251-300 = 5 units; 301-350 = 7 units; >350 = 9 units; >400 or <60 = CALL THE DOCTOR 12/15/15  Yes Catarina Hartshorn, MD  insulin detemir (LEVEMIR) 100 UNIT/ML injection Inject 12 Units into the skin 2 (two) times daily.    Yes Historical Provider, MD  lamoTRIgine (LAMICTAL) 25 MG tablet Take 50 mg by mouth at bedtime.   Yes Historical Provider, MD  levothyroxine (SYNTHROID, LEVOTHROID) 50 MCG tablet Take 50 mcg by mouth daily before breakfast.   Yes Historical Provider, MD  LORazepam (ATIVAN) 0.5 MG tablet Take 1 tablet (0.5 mg total) by mouth at bedtime. Anxiety 12/15/15  Yes Catarina Hartshorn, MD  metoCLOPramide (REGLAN) 5 MG tablet  Take 1 tablet (5 mg total) by mouth 3 (three) times daily before meals. Patient taking differently: Take 5 mg by mouth 3 (three) times daily before meals. For Gastroparesis 11/24/15  Yes Christina P Rama, MD  pantoprazole (PROTONIX) 40 MG tablet Take 1 tablet (40 mg total) by mouth 2 (two) times daily. 04/28/15  Yes Shanker Levora Dredge, MD  polyethylene glycol (MIRALAX / GLYCOLAX) packet Take 17 g by mouth daily. Patient taking differently: Take 17 g by mouth daily. Mix in 8 oz water and drink 11/24/15  Yes Christina P Rama, MD  prazosin (MINIPRESS) 1 MG capsule Take 1 mg by mouth at bedtime.    Yes Historical Provider, MD  rosuvastatin (CRESTOR) 5 MG tablet Take 5 mg by mouth daily at 6 PM.    Yes Historical Provider, MD  sertraline (ZOLOFT) 50 MG tablet Take 50 mg by mouth 3 (three) times daily.  Yes Historical Provider, MD  traMADol (ULTRAM) 50 MG tablet Take 1 tablet (50 mg total) by mouth every 6 (six) hours as needed for moderate pain. 12/15/15  Yes Catarina Hartshornavid Tat, MD  UNABLE TO FIND Med Pass 1.7 Sugar-free: 120 ml's by mouth three times a day with meals   Yes Historical Provider, MD  feeding supplement, ENSURE ENLIVE, (ENSURE ENLIVE) LIQD Take 237 mLs by mouth 3 (three) times daily with meals. Patient not taking: Reported on 01/19/2016 11/24/15   Maryruth Bunhristina P Rama, MD    Family History Family History  Problem Relation Age of Onset  . Liver disease Sister   . Colon cancer Brother     Social History Social History  Substance Use Topics  . Smoking status: Former Smoker    Packs/day: 1.00    Years: 27.00    Types: Cigarettes  . Smokeless tobacco: Never Used     Comment: "quit smoking cigarettes in 1985  . Alcohol use No     Allergies   Diclofenac sodium; Doxycycline calcium; Calcium-containing compounds; Codeine; and Zocor [simvastatin]   Review of Systems Review of Systems  Constitutional: Negative for diaphoresis.  Respiratory: Negative for shortness of breath.   Cardiovascular:  Negative for syncope.  Gastrointestinal: Negative for abdominal pain.  Endocrine: Positive for polydipsia.  Genitourinary: Negative for dysuria.  Neurological: Positive for weakness. Negative for dizziness.  Psychiatric/Behavioral: Negative for confusion.  All other systems reviewed and are negative.    Physical Exam Updated Vital Signs BP (!) 122/57   Pulse 87   Temp 98.7 F (37.1 C) (Oral)   Resp 17   Ht 5\' 2"  (1.575 m)   Wt 96 lb 12.5 oz (43.9 kg)   SpO2 100%   BMI 17.70 kg/m   Physical Exam  Constitutional: She appears well-developed and well-nourished. No distress.  HENT:  Head: Normocephalic and atraumatic.  Eyes: Conjunctivae are normal.  Neck: Neck supple.  Cardiovascular: Normal rate and regular rhythm.   No murmur heard. Pulmonary/Chest: Effort normal and breath sounds normal. No respiratory distress.  Abdominal: Soft. There is no tenderness.  Musculoskeletal: She exhibits no edema.  Neurological: She is alert.  Skin: Skin is warm and dry.  Psychiatric: She has a normal mood and affect.  Nursing note and vitals reviewed.    ED Treatments / Results  Labs (all labs ordered are listed, but only abnormal results are displayed) Labs Reviewed  BASIC METABOLIC PANEL - Abnormal; Notable for the following:       Result Value   Sodium 131 (*)    Chloride 93 (*)    CO2 14 (*)    Glucose, Bld 746 (*)    BUN 27 (*)    Creatinine, Ser 1.22 (*)    GFR calc non Af Amer 43 (*)    GFR calc Af Amer 49 (*)    Anion gap 24 (*)    All other components within normal limits  CBC - Abnormal; Notable for the following:    RBC 3.59 (*)    Hemoglobin 10.2 (*)    HCT 32.3 (*)    All other components within normal limits  URINALYSIS, ROUTINE W REFLEX MICROSCOPIC (NOT AT [facility]) - Abnormal; Notable for the following:    Glucose, UA >1000 (*)    Ketones, ur >80 (*)    All other components within normal limits  GLUCOSE, CAPILLARY - Abnormal; Notable for the following:     Glucose-Capillary 392 (*)    All other components within normal  limits  GLUCOSE, CAPILLARY - Abnormal; Notable for the following:    Glucose-Capillary 318 (*)    All other components within normal limits  GLUCOSE, CAPILLARY - Abnormal; Notable for the following:    Glucose-Capillary 264 (*)    All other components within normal limits  GLUCOSE, CAPILLARY - Abnormal; Notable for the following:    Glucose-Capillary 156 (*)    All other components within normal limits  CBG MONITORING, ED - Abnormal; Notable for the following:    Glucose-Capillary >600 (*)    All other components within normal limits  I-STAT ARTERIAL BLOOD GAS, ED - Abnormal; Notable for the following:    pH, Arterial 7.274 (*)    pCO2 arterial 31.7 (*)    Bicarbonate 14.7 (*)    Acid-base deficit 11.0 (*)    All other components within normal limits  URINE CULTURE  URINE MICROSCOPIC-ADD ON  MAGNESIUM  HEMOGLOBIN A1C  BASIC METABOLIC PANEL  BASIC METABOLIC PANEL  BASIC METABOLIC PANEL  BASIC METABOLIC PANEL  BASIC METABOLIC PANEL  CBG MONITORING, ED  CBG MONITORING, ED    EKG  EKG Interpretation None       Radiology Dg Chest 1 View  Result Date: 02/02/2016 CLINICAL DATA:  Shortness of breath.  Hyperglycemia. EXAM: CHEST 1 VIEW COMPARISON:  11/20/2015 FINDINGS: Mild cardiac enlargement. Coronary artery stent. Central and perihilar interstitial changes with peribronchial thickening likely due to chronic bronchitis. No focal airspace disease or consolidation in the lungs. No blunting of costophrenic angles. No pneumothorax. Postoperative changes in the cervical spine. Degenerative changes in the spine and shoulders. Previous kyphoplasty changes at L1. IMPRESSION: Chronic bronchitic changes in the lungs. Mild cardiac enlargement. No evidence of active pulmonary disease. Electronically Signed   By: Burman Nieves M.D.   On: 02/02/2016 18:29    Procedures Procedures (including critical care time)  CRITICAL  CARE Performed by: Marily Memos Total critical care time: 35 minutes Critical care time was exclusive of separately billable procedures and treating other patients. Critical care was necessary to treat or prevent imminent or life-threatening deterioration. Critical care was time spent personally by me on the following activities: development of treatment plan with patient and/or surrogate as well as nursing, discussions with consultants, evaluation of patient's response to treatment, examination of patient, obtaining history from patient or surrogate, ordering and performing treatments and interventions, ordering and review of laboratory studies, ordering and review of radiographic studies, pulse oximetry and re-evaluation of patient's condition.   Medications Ordered in ED Medications  amLODipine (NORVASC) tablet 10 mg (10 mg Oral Given 02/02/16 2130)  ARIPiprazole (ABILIFY) tablet 5 mg (5 mg Oral Given 02/02/16 2127)  bisacodyl (DULCOLAX) EC tablet 10 mg (not administered)  donepezil (ARICEPT) tablet 10 mg (10 mg Oral Given 02/02/16 2126)  metoCLOPramide (REGLAN) tablet 5 mg (not administered)  LORazepam (ATIVAN) tablet 0.5 mg (0.5 mg Oral Given 02/02/16 2126)  levothyroxine (SYNTHROID, LEVOTHROID) tablet 50 mcg (not administered)  rosuvastatin (CRESTOR) tablet 5 mg (5 mg Oral Given 02/02/16 2126)  sertraline (ZOLOFT) tablet 50 mg (50 mg Oral Given 02/02/16 2126)  polyethylene glycol (MIRALAX / GLYCOLAX) packet 17 g (17 g Oral Given 02/02/16 2130)  dextrose 5 %-0.45 % sodium chloride infusion (500 mLs Intravenous New Bag/Given 02/02/16 2350)  insulin regular (NOVOLIN R,HUMULIN R) 250 Units in sodium chloride 0.9 % 250 mL (1 Units/mL) infusion (2.9 Units/hr Intravenous Rate/Dose Change 02/02/16 2329)  enoxaparin (LOVENOX) injection 30 mg (30 mg Subcutaneous Given 02/02/16 2126)  0.9 %  sodium  chloride infusion (1,000 mLs Intravenous New Bag/Given 02/02/16 2021)  lamoTRIgine (LAMICTAL) tablet 25 mg (25  mg Oral Given 02/02/16 2127)  sodium chloride 0.9 % bolus 1,000 mL (0 mLs Intravenous Stopped 02/02/16 1814)  insulin aspart (novoLOG) injection 6 Units (6 Units Intravenous Given 02/02/16 1658)  sodium chloride 0.9 % bolus 1,000 mL (1,000 mLs Intravenous New Bag/Given 02/02/16 1908)     Initial Impression / Assessment and Plan / ED Course  I have reviewed the triage vital signs and the nursing notes.  Pertinent labs & imaging results that were available during my care of the patient were reviewed by me and considered in my medical decision making (see chart for details).  Clinical Course   Hyperglycemia, unsure of cause. Will evaluate and treat appropriately.   Has DKA, will start insulin drip and admit to stepdown. Recently with triad, will readmit there.    Final Clinical Impressions(s) / ED Diagnoses   Final diagnoses:  Diabetic ketoacidosis without coma associated with type 1 diabetes mellitus (HCC)     Marily Memos, MD 02/03/16 [phone]

## 2016-02-02 NOTE — H&P (Signed)
TRH H&P   Patient Demographics:    Beth Payne, is a 75 y.o. female  MRN: [id number]   DOB - September 05, 1940  Admit Date - 02/02/2016  Outpatient Primary MD for the patient is [name], MD     Patient coming from: SNF  Chief Complaint  Patient presents with  . Hyperglycemia      HPI:    Beth Payne  is a 75 y.o. female, With history of type 1 diabetes mellitus, CAD, MI, TIA, dementia, fibromyalgia, reflux who is a nursing home resident brought into the hospital after her sugars were noted to be very high at the nursing home. In the ER workup was suggestive of DKA in a type I diabetic, I was called to admit the patient, patient is pleasantly confused however she appears to be in no distress and has no subjective complaints whatsoever.    Review of systems:    In addition to the HPI above, Unreliable historian but negative review of systems No Fever-chills, No Headache, No changes with Vision or hearing, No problems swallowing food or Liquids, No Chest pain, Cough or Shortness of Breath, No Abdominal pain, No Nausea or Vommitting, Bowel movements are regular, No Blood in stool or Urine, No dysuria, No new skin rashes or bruises, No new joints pains-aches,  No new weakness, tingling, numbness in any extremity, No recent weight gain or loss, No polyuria, polydypsia or polyphagia, No significant Mental Stressors.  A full 10 point Review of Systems was done, except as stated above, all other Review of Systems were negative.   With Past History of the following :    Past Medical History:  Diagnosis Date  . Anxiety   . Arthritis    "fingers" (12/23/2014)  . Chronic lower back pain   .  Coronary artery disease   . Dementia   . Depression   . Fibromyalgia   . Fracture, ankle 12/23/2014  . GERD (gastroesophageal reflux disease) dx'd 1995  . Heart murmur   . NSTEMI (non-ST elevated myocardial infarction) (HCC) 12/13/2014  . Osteopenia dx'd 2007  . Retinopathy   . TIA (transient ischemic attack)   . Type I diabetes mellitus (HCC) dx'd 1953   "I'm on a pup" (12/23/2014)      Past Surgical History:  Procedure Laterality Date  . ABDOMINAL HYSTERECTOMY  1972  . ANTERIOR CERVICAL DECOMP/DISCECTOMY FUSION  05/2003   "C5-6"  . APPENDECTOMY    . BACK SURGERY    . BILATERAL CARPAL TUNNEL RELEASE Bilateral 2003-2004  . BUNIONECTOMY WITH HAMMERTOE RECONSTRUCTION Right 02/1999  . CATARACT EXTRACTION W/ INTRAOCULAR LENS  IMPLANT, BILATERAL Bilateral 1998  . CERVICAL DISC SURGERY  2004   "collapsed C-5"  . CESAREAN SECTION    . CORONARY ANGIOPLASTY WITH STENT PLACEMENT  07/1999   "?1"  . DILATION AND CURETTAGE OF UTERUS    . EXPLORATORY LAPAROTOMY  1975-1987 X 1   "adhesions"  . EYE SURGERY Bilateral since 1977   "numerous slaser surgeries for retinopathy"  . FRACTURE SURGERY    . LAPAROSCOPIC LYSIS OF ADHESIONS  [phone] X 5  . LUMBAR MICRODISCECTOMY  03/2005  . OOPHORECTOMY Bilateral 1980's  . ORIF ANKLE FRACTURE Left 12/25/2014   Procedure: OPEN REDUCTION INTERNAL FIXATION (ORIF) LEFT TRIMALLEOLAR ANKLE FRACTURE;  Surgeon: Tarry Kos, MD;  Location: [facility] OR;  Service: Orthopedics;  Laterality: Left;  . PATELLA FRACTURE SURGERY Left 04/2007  . TONSILLECTOMY    . TRIGGER FINGER RELEASE Bilateral 2003  . VERTEBROPLASTY  01/2008   "compressed lumbar fracture"  . VITRECTOMY Right 08/1997      Social History:     Social History  Substance Use Topics  . Smoking status: Former Smoker    Packs/day: 1.00    Years: 27.00    Types: Cigarettes  . Smokeless tobacco: Never Used     Comment: "quit smoking cigarettes in 1985  . Alcohol use No         Family History :       Family History  Problem Relation Age of Onset  . Liver disease Sister   . Colon cancer Brother        Home Medications:   Prior to Admission medications   Medication Sig Start Date End Date Taking? Authorizing Provider  amLODipine (NORVASC) 10 MG tablet Take 1 tablet (10 mg total) by mouth daily. 05/22/15  Yes Richarda Overlie, MD  metoCLOPramide (REGLAN) 5 MG tablet Take 1 tablet (5 mg total) by mouth 3 (three) times daily before meals. Patient taking differently: Take 5 mg by mouth 3 (three) times daily before meals. For Gastroparesis 11/24/15  Yes Maryruth Bun Rama, MD  Amino Acids-Protein Hydrolys (FEEDING SUPPLEMENT, PRO-STAT SUGAR FREE 64,) LIQD Take 30 mLs by mouth 2 (two) times daily with a meal. 11/24/15   Christina P Rama, MD  ARIPiprazole (ABILIFY) 5 MG tablet Take 1 tablet (5 mg total) by mouth every morning. Patient taking differently: Take 5 mg by mouth daily.  04/28/15   Shanker Levora Dredge, MD  bisacodyl (DULCOLAX) 5 MG EC tablet Take 10 mg by mouth daily as needed for mild constipation or moderate constipation.    Historical Provider, MD  cholecalciferol (VITAMIN D) 1000 units tablet Take 2,000 Units by mouth daily.    Historical Provider, MD  donepezil (ARICEPT) 10 MG tablet Take 10 mg by mouth at bedtime.    Historical Provider, MD  feeding supplement, ENSURE ENLIVE, (ENSURE ENLIVE) LIQD Take 237 mLs by mouth 3 (three) times daily with meals. Patient not taking: Reported on 01/19/2016 11/24/15   Maryruth Bun Rama, MD  ferrous sulfate 325 (65 FE) MG tablet Take 325 mg by mouth 2 (two) times daily. 8am, 4pm    Historical Provider, MD  insulin aspart (NOVOLOG) 100 UNIT/ML injection Inject at bedtime based upon sugars.  Sugar 0-120--no units; 121-150--1 unit;  151-200--2 units;  201-250--3 units; 251-300--5 units;  301-350--7 units;  351-400--9 units Patient taking differently: Inject 0-9 Units into the skin See admin instructions. Inject 0-5 units subcutaneously before meals based on  sliding scale: CBG 0-200 0 units, 201-250 2 units, 251-300 3 units, 301-350 4 units, 351-400 5 units; AND Inject 0-9 units subcutaneously at bedtime based on sliding scale: CBG 121-150 1 unit, 151-200 2 units, 201-250 3 units, 251-300 5 units, 301-350 7 units, 351-400 9 units. 12/15/15   Catarina Hartshorn, MD  insulin detemir (LEVEMIR) 100 UNIT/ML injection Inject 6-8 Units into the skin See admin instructions. Inject 8 units subcutaneously every morning (8am) and 6 units every evening (5pm)    Historical Provider, MD  lamoTRIgine (LAMICTAL) 25 MG tablet Take 50 mg by mouth at bedtime.    Historical Provider, MD  levothyroxine (SYNTHROID, LEVOTHROID) 50 MCG tablet Take 50 mcg by mouth daily before breakfast.    Historical Provider, MD  LORazepam (ATIVAN) 0.5 MG tablet Take 1 tablet (0.5 mg total) by mouth at bedtime. Anxiety 12/15/15   Catarina Hartshorn, MD  pantoprazole (PROTONIX) 40 MG tablet Take 1 tablet (40 mg total) by mouth 2 (two) times daily. Patient taking differently: Take 40 mg by mouth 2 (two) times daily. 7:30am, 4:30pm 04/28/15   Maretta Bees, MD  polyethylene glycol (MIRALAX / GLYCOLAX) packet Take 17 g by mouth daily. Patient taking differently: Take 17 g by mouth daily. Mix in 8 oz water and drink 11/24/15   Maryruth Bun Rama, MD  prazosin (MINIPRESS) 1 MG capsule Take 1 mg by mouth at bedtime.     Historical Provider, MD  rosuvastatin (CRESTOR) 5 MG tablet Take 5 mg by mouth daily at 6 PM.     Historical Provider, MD  sertraline (ZOLOFT) 50 MG tablet Take 50 mg by mouth 3 (three) times daily. 6am, 2pm, 10pm    Historical Provider, MD  traMADol (ULTRAM) 50 MG tablet Take 1 tablet (50 mg total) by mouth every 6 (six) hours as needed for moderate pain. 12/15/15   Catarina Hartshorn, MD     Allergies:     Allergies  Allergen Reactions  . Diclofenac Sodium Swelling and Other (See Comments)    Blurred vision  . Doxycycline Calcium Swelling and Other (See Comments)    Blurred vision  . Calcium-Containing  Compounds Other (See Comments)    Calcium listed as an allergy on [facility] 08/10/15  . Codeine Nausea And Vomiting and Other (See Comments)    Dizziness   . Zocor [Simvastatin] Other (See Comments)    Dizziness, weakness      Physical Exam:   Vitals  Blood pressure (!) 125/36, pulse 86, temperature 98.6 F (37 C), temperature source Oral, resp. rate 18, SpO2 97 %.   1. General Pleasant elderly African-American female lying in bed in NAD,     2. Normal affect and insight, Not Suicidal or Homicidal, Awake Alert, Oriented X 1.  3. No F.N deficits, ALL C.Nerves Intact, Strength 5/5 all 4 extremities, Sensation intact all 4 extremities, Plantars down going.  4. Ears and Eyes appear Normal, Conjunctivae clear, PERRLA. Moist Oral Mucosa.  5. Supple Neck, No JVD, No cervical lymphadenopathy appriciated, No Carotid Bruits.  6. Symmetrical Chest wall movement, Good air movement bilaterally, CTAB.  7. RRR, No Gallops, Rubs or Murmurs, No Parasternal Heave.  8. Positive Bowel Sounds, Abdomen Soft, No tenderness, No organomegaly appriciated,No rebound -guarding or rigidity.  9.  No Cyanosis, Normal Skin Turgor, No Skin  Rash or Bruise.  10. Good muscle tone,  joints appear normal , no effusions, Normal ROM.  11. No Palpable Lymph Nodes in Neck or Axillae      Data Review:    CBC  Recent Labs Lab 02/02/16 1544  WBC 9.0  HGB 10.2*  HCT 32.3*  PLT 254  MCV 90.0  MCH 28.4  MCHC 31.6  RDW 14.3   ------------------------------------------------------------------------------------------------------------------  Chemistries   Recent Labs Lab 02/02/16 1544  NA 131*  K 4.7  CL 93*  CO2 14*  GLUCOSE 746*  BUN 27*  CREATININE 1.22*  CALCIUM 10.1   ------------------------------------------------------------------------------------------------------------------ estimated creatinine clearance is 28.6 mL/min (by C-G formula based on SCr of 1.22  mg/dL). ------------------------------------------------------------------------------------------------------------------ No results for input(s): TSH, T4TOTAL, T3FREE, THYROIDAB in the last 72 hours.  Invalid input(s): FREET3  Coagulation profile No results for input(s): INR, PROTIME in the last 168 hours. ------------------------------------------------------------------------------------------------------------------- No results for input(s): DDIMER in the last 72 hours. -------------------------------------------------------------------------------------------------------------------  Cardiac Enzymes No results for input(s): CKMB, TROPONINI, MYOGLOBIN in the last 168 hours.  Invalid input(s): CK ------------------------------------------------------------------------------------------------------------------ No results found for: BNP   ---------------------------------------------------------------------------------------------------------------  Urinalysis    Component Value Date/Time   COLORURINE YELLOW 12/13/2015 2026   APPEARANCEUR CLEAR 12/13/2015 2026   LABSPEC 1.031 (H) 12/13/2015 2026   PHURINE 5.0 12/13/2015 2026   GLUCOSEU >1000 (A) 12/13/2015 2026   HGBUR NEGATIVE 12/13/2015 2026   BILIRUBINUR NEGATIVE 12/13/2015 2026   KETONESUR 15 (A) 12/13/2015 2026   PROTEINUR NEGATIVE 12/13/2015 2026   UROBILINOGEN 0.2 02/23/2015 1433   NITRITE NEGATIVE 12/13/2015 2026   LEUKOCYTESUR NEGATIVE 12/13/2015 2026    ----------------------------------------------------------------------------------------------------------------   Imaging Results:   Baseline EKG and chest x-ray ordered   Assessment & Plan:     1. DKA and a type [facility] Multiple admissions for the same reason, question appropriate medication and restoration was as noncompliance. Will check A1c, IV fluid bolus for maintenance, will go ahead and give her Levemir dose tonight and then  twice a day, IV insulin drip, monitor electrolytes, will check baseline UA, EKG and chest x-ray.  2. Essential hypertension. Stable continue Norvasc and monitor blood pressure.  3. Dementia. Moderate. At risk for delirium, continue combination of Aricept, Abilify minimize narcotics and benzodiazepines.  4. Dyslipidemia. Continue statin.  5. History of CAD and TIA. No acute issues, continue statin for secondary prevention. She is not on any antiplatelet medications at this time. Symptom-free.  6. GERD. On PPI.  7. Hypothyroidism. Continue Synthroid, check baseline TSH.  8. Anxiety and depression. Continue home dose Ativan, Zoloft and Lamictal.  DVT Prophylaxis  Lovenox   AM Labs Ordered, also please review Full Orders  Family Communication: Admission, patients condition and plan of care including tests being ordered have been discussed with the patient  who indicate understanding and agree with the plan and Code Status.  Code Status Full  Likely DC to  SNF  Condition GUARDED    Consults called: None    Admission status: Inpt    Time spent in minutes : 35   Anahi Belmar K M.D on 02/02/2016 at 6:06 PM  Between 7am to 7pm - Pager - [phone]. After 7pm go to [email or web address] - password [email or web address]  [facility] - Office  [phone]

## 2016-02-02 NOTE — ED Notes (Signed)
Respiratory Therapist at bedside.

## 2016-02-02 NOTE — ED Triage Notes (Signed)
[facility].  EMS states their glucose reader said High as well as nursing home reading.  Patient A&Ox4 with vitals in normal ranges.

## 2016-02-02 NOTE — ED Notes (Signed)
MD at bedside. 

## 2016-02-03 LAB — BASIC METABOLIC PANEL
ANION GAP: 6 (ref 5–15)
Anion gap: 9 (ref 5–15)
Anion gap: 7 (ref 5–15)
BUN: 13 mg/dL (ref 6–20)
BUN: 14 mg/dL (ref 6–20)
BUN: 10 mg/dL (ref 6–20)
CALCIUM: 7.5 mg/dL — AB (ref 8.9–10.3)
CHLORIDE: 107 mmol/L (ref 101–111)
CO2: 19 mmol/L — AB (ref 22–32)
CO2: 24 mmol/L (ref 22–32)
CO2: 24 mmol/L (ref 22–32)
CREATININE: 0.68 mg/dL (ref 0.44–1.00)
Calcium: 9.4 mg/dL (ref 8.9–10.3)
Calcium: 9.3 mg/dL (ref 8.9–10.3)
Chloride: 87 mmol/L — ABNORMAL LOW (ref 101–111)
Chloride: 107 mmol/L (ref 101–111)
Creatinine, Ser: 0.54 mg/dL (ref 0.44–1.00)
Creatinine, Ser: 0.48 mg/dL (ref 0.44–1.00)
GFR calc Af Amer: >60 mL/min (ref >60)
GFR calc Af Amer: >60 mL/min (ref >60)
GFR calc non Af Amer: >60 mL/min (ref >60)
GFR calc non Af Amer: >60 mL/min (ref >60)
GLUCOSE: 95 mg/dL (ref 65–99)
GLUCOSE: 89 mg/dL (ref 65–99)
Glucose, Bld: 124 mg/dL — ABNORMAL HIGH (ref 65–99)
POTASSIUM: 3.2 mmol/L — AB (ref 3.5–5.1)
POTASSIUM: 3.3 mmol/L — AB (ref 3.5–5.1)
Potassium: 3.8 mmol/L (ref 3.5–5.1)
SODIUM: 115 mmol/L — AB (ref 135–145)
Sodium: 138 mmol/L (ref 135–145)
Sodium: 137 mmol/L (ref 135–145)

## 2016-02-03 LAB — URINE CULTURE

## 2016-02-03 LAB — GLUCOSE, CAPILLARY
GLUCOSE-CAPILLARY: 104 mg/dL — AB (ref 65–99)
GLUCOSE-CAPILLARY: 118 mg/dL — AB (ref 65–99)
GLUCOSE-CAPILLARY: 135 mg/dL — AB (ref 65–99)
GLUCOSE-CAPILLARY: 62 mg/dL — AB (ref 65–99)
GLUCOSE-CAPILLARY: 85 mg/dL (ref 65–99)
GLUCOSE-CAPILLARY: 88 mg/dL (ref 65–99)
Glucose-Capillary: 133 mg/dL — ABNORMAL HIGH (ref 65–99)
Glucose-Capillary: 67 mg/dL (ref 65–99)
Glucose-Capillary: 100 mg/dL — ABNORMAL HIGH (ref 65–99)
Glucose-Capillary: 95 mg/dL (ref 65–99)
Glucose-Capillary: 109 mg/dL — ABNORMAL HIGH (ref 65–99)
Glucose-Capillary: 97 mg/dL (ref 65–99)

## 2016-02-03 LAB — MAGNESIUM: Magnesium: 1.8 mg/dL (ref 1.7–2.4)

## 2016-02-03 MED ORDER — INSULIN DETEMIR 100 UNIT/ML ~~LOC~~ SOLN: 10.0000 [IU] | Freq: Two times a day (BID) | SUBCUTANEOUS | 0 refills | Status: DC

## 2016-02-03 MED ORDER — DEXTROSE 50 % IV SOLN
50.0000 mL | Freq: Once | INTRAVENOUS | Status: AC
  Administered 2016-02-03: 50 mL via INTRAVENOUS

## 2016-02-03 MED ORDER — DEXTROSE 50 % IV SOLN
50.0000 mL | INTRAVENOUS | Status: DC | PRN
  Administered 2016-02-03: 50 mL via INTRAVENOUS
  Filled 2016-02-03: qty 50

## 2016-02-03 MED ORDER — DEXTROSE 10 % IV SOLN
INTRAVENOUS | Status: DC
  Administered 2016-02-03: 1000 mL via INTRAVENOUS

## 2016-02-03 MED ORDER — DEXTROSE 50 % IV SOLN
INTRAVENOUS | Status: AC
  Filled 2016-02-03: qty 50

## 2016-02-03 MED ORDER — POTASSIUM CHLORIDE CRYS ER 10 MEQ PO TBCR
30.0000 meq | EXTENDED_RELEASE_TABLET | Freq: Once | ORAL | Status: AC
  Administered 2016-02-03: 30 meq via ORAL
  Filled 2016-02-03: qty 1

## 2016-02-03 MED ORDER — MAGNESIUM SULFATE IN D5W 1-5 GM/100ML-% IV SOLN
1.0000 g | Freq: Once | INTRAVENOUS | Status: AC
  Administered 2016-02-03: 1 g via INTRAVENOUS
  Filled 2016-02-03: qty 100

## 2016-02-03 MED ORDER — DEXTROSE 5 % IV SOLN
INTRAVENOUS | Status: DC
  Administered 2016-02-03: 250 mL via INTRAVENOUS

## 2016-02-03 MED ORDER — SODIUM CHLORIDE 0.9 % IV BOLUS (SEPSIS)
500.0000 mL | Freq: Once | INTRAVENOUS | Status: AC
  Administered 2016-02-03: 500 mL via INTRAVENOUS

## 2016-02-03 MED ORDER — POTASSIUM CHLORIDE CRYS ER 20 MEQ PO TBCR
20.0000 meq | EXTENDED_RELEASE_TABLET | Freq: Once | ORAL | Status: AC
  Administered 2016-02-03: 20 meq via ORAL
  Filled 2016-02-03: qty 1

## 2016-02-03 MED ORDER — POTASSIUM CHLORIDE CRYS ER 20 MEQ PO TBCR
40.0000 meq | EXTENDED_RELEASE_TABLET | Freq: Four times a day (QID) | ORAL | Status: AC
  Administered 2016-02-03 (×2): 40 meq via ORAL
  Filled 2016-02-03 (×2): qty 2

## 2016-02-03 MED ORDER — INSULIN DETEMIR 100 UNIT/ML ~~LOC~~ SOLN: 12.0000 [IU] | Freq: Two times a day (BID) | SUBCUTANEOUS | 11 refills | Status: DC

## 2016-02-03 MED ORDER — INSULIN DETEMIR 100 UNIT/ML ~~LOC~~ SOLN
12.0000 [IU] | Freq: Two times a day (BID) | SUBCUTANEOUS | Status: DC
  Administered 2016-02-03: 12 [IU] via SUBCUTANEOUS
  Filled 2016-02-03 (×2): qty 0.12

## 2016-02-03 MED ORDER — INSULIN ASPART 100 UNIT/ML ~~LOC~~ SOLN: 0.0000 [IU] | Freq: Three times a day (TID) | SUBCUTANEOUS | Status: DC

## 2016-02-03 NOTE — Progress Notes (Signed)
CRITICAL VALUE ALERT  Critical value received:  Sodium 115  Date of notification:  02/03/2016  Time of notification:  0320  Critical value read back:Yes.    Nurse who received alert:  Julious OkaKarimah Abdussalaam RN  MD notified (1st page):  Craige CottaKirby NP  Time of first page:    MD notified (2nd page):  Time of second page:  Responding MD:    Time MD responded:  STAT repeat.

## 2016-02-03 NOTE — Progress Notes (Signed)
Inpatient Diabetes Program Recommendations  AACE/ADA: New Consensus Statement on Inpatient Glycemic Control (2015)  Target Ranges:  Prepandial:   less than 140 mg/dL      Peak postprandial:   less than 180 mg/dL (1-2 hours)      Critically ill patients:  140 - 180 mg/dL   Lab Results  Component Value Date   GLUCAP 97 02/03/2016   HGBA1C 10.3 (H) 11/21/2015    Review of Glycemic Control  Diabetes history: DM1 Outpatient Diabetes medications: Levemir 8 units in am and 6 units QHS, Novolog sensitive tidwc and hs + 3-3-2 units tidwc Current orders for Inpatient glycemic control: Levemir 12 units bid, Novolog sensitive tidwc.  Last HgbA1C - 10.3% on 11/21/2015. Very brittle Type 1 and very sensitive to insulin.  Inpatient Diabetes Program Recommendations:   Decrease Levemir to 6 units in am and 4 units in pm. Add Novolog 3 units tidwc for meal coverage insulin if pt eats > 50% meal. Add HS correction. Need updated HgbA1C.  Will continue to follow.  Thank you. Beth Payne, RD, LDN, CDE Inpatient Diabetes Coordinator [phone]

## 2016-02-03 NOTE — Discharge Instructions (Signed)
Follow with Primary MD Julian HySOUTH,STEPHEN ALAN, MD in 7 days   Get CBC, CMP, 2 view Chest X ray checked  by Primary MD or SNF MD in 5-7 days ( we routinely change or add medications that can affect your baseline labs and fluid status, therefore we recommend that you get the mentioned basic workup next visit with your PCP, your PCP may decide not to get them or add new tests based on their clinical decision)   Activity: As tolerated with Full fall precautions use walker/cane & assistance as needed   Disposition SNF   Diet:   Heart Healthy Low Carb.  Accuchecks 4 times/day, Once in AM empty stomach and then before each meal. Log in all results and show them to your Prim.MD in 3 days. If any glucose reading is under 80 or above 300 call your Prim MD immidiately. Follow Low glucose instructions for glucose under 80 as instructed.   For Heart failure patients - Check your Weight same time everyday, if you gain over 2 pounds, or you develop in leg swelling, experience more shortness of breath or chest pain, call your Primary MD immediately. Follow Cardiac Low Salt Diet and 1.5 lit/day fluid restriction.   On your next visit with your primary care physician please Get Medicines reviewed and adjusted.   Please request your Prim.MD to go over all Hospital Tests and Procedure/Radiological results at the follow up, please get all Hospital records sent to your Prim MD by signing hospital release before you go home.   If you experience worsening of your admission symptoms, develop shortness of breath, life threatening emergency, suicidal or homicidal thoughts you must seek medical attention immediately by calling 911 or calling your MD immediately  if symptoms less severe.  You Must read complete instructions/literature along with all the possible adverse reactions/side effects for all the Medicines you take and that have been prescribed to you. Take any new Medicines after you have completely understood  and accpet all the possible adverse reactions/side effects.   Do not drive, operate heavy machinery, perform activities at heights, swimming or participation in water activities or provide baby sitting services if your were admitted for syncope or siezures until you have seen by Primary MD or a Neurologist and advised to do so again.  Do not drive when taking Pain medications.    Do not take more than prescribed Pain, Sleep and Anxiety Medications  Special Instructions: If you have smoked or chewed Tobacco  in the last 2 yrs please stop smoking, stop any regular Alcohol  and or any Recreational drug use.  Wear Seat belts while driving.   Please note  You were cared for by a hospitalist during your hospital stay. If you have any questions about your discharge medications or the care you received while you were in the hospital after you are discharged, you can call the unit and asked to speak with the hospitalist on call if the hospitalist that took care of you is not available. Once you are discharged, your primary care physician will handle any further medical issues. Please note that NO REFILLS for any discharge medications will be authorized once you are discharged, as it is imperative that you return to your primary care physician (or establish a relationship with a primary care physician if you do not have one) for your aftercare needs so that they can reassess your need for medications and monitor your lab values.

## 2016-02-03 NOTE — Progress Notes (Addendum)
A portion of the 2350 hrs BMP resulted but not the sodium or anion gap. Pt's Bicarb was up to 19. NS 500cc bolus and K repleted at that time. Sugars were stable.  At 0140 hrs, pt had episode of hypoglycemia, D50 given and IVF changed to D5W at 0145 (this was after the below critical result). After awhile of not seeing rest of BMP results, NP called lab. At that point, lab reported a critically low sodium 115 on the 2350 hrs BMP. However, sodium prior was 131, so NP questions accuracy of lab. Will have lab redraw BMP now (0330). Until lab results, will hold insulin drip and D5 IVF. Plan discussed with RN, Joyce GrossKay.  Update: New BMP, Na 138, K 3.2, Bicarb 24, creat normal, gap closed. Since pt had long acting insulin earlier last pm, will go ahead and stop insulin drip and just watch sugars carefully for now. Saline lock IV. Carb modified diet. Supplement K with 20meq Kdur. Should sugars go low, will start slow D5W. If higher, can give some basal insulin. Plan discussed with RN.  BMP at 0800.  KJKG, NP [facility]

## 2016-02-03 NOTE — Progress Notes (Signed)
Patient will discharge to [facility] Anticipated discharge date: 8/29 Family notified: Donna Bernardon [employer]er [employer] by Sharin MonsPTAR- scheduled for 2pm  CSW signing off.  Burna SisJenna H. Isao Seltzer, LCSWA Clinical Social Worker [phone]

## 2016-02-03 NOTE — Progress Notes (Signed)
Hypoglycemic Event  CBG: 67  Treatment: D50 IV 25 mL  Symptoms: None  Follow-up CBG: Time: 0155 CBG Result: 135  Possible Reasons for Event: Medication regimen: Levemir added to regimen  Comments/MD notified:Kirby NP    Abdussalaam, Eugene GarnetKarimah C

## 2016-02-03 NOTE — Discharge Summary (Signed)
Beth [facility] ZOX:[id number] DOB: 02/22/1941 DOA: 02/02/2016  PCP: Julian HySOUTH,STEPHEN ALAN, MD  Admit date: 02/02/2016  Discharge date: 02/03/2016  Admitted From: SNF   Disposition:  SNF   Recommendations for Outpatient Follow-up:   Follow up with PCP in 1-2 weeks  PCP Please obtain BMP/CBC, 2 view CXR in 1week,  (see Discharge instructions)   PCP Please follow up on the following pending results: None   Home Health: None   Equipment/Devices: None  Consultations: None Discharge Condition: Fair   CODE STATUS: Full   Diet Recommendation: Heart Healthy low carbohydrate   Chief Complaint  Patient presents with  . Hyperglycemia     Brief history of present illness from the day of admission and additional interim summary       Beth Payne  is a 75 y.o. female, With history of type 1 diabetes mellitus, CAD, MI, TIA, dementia, fibromyalgia, reflux who is a nursing home resident brought into the [facility] after her sugars were noted to be very high at the nursing home. In the ER workup was suggestive of DKA in a type I diabetic, I was called to admit the patient, patient is pleasantly confused however she appears to be in no distress and has no subjective complaints whatsoever.  [facility] issues addressed     1. DKA and a type I [facility] Multiple admissions for the same reason, question appropriate medication Administration versus noncompliance. Pending A1c, she was treated with DKA protocol with IV insulin and IV fluids along with Levemir. DKA has completely resolved sugars are stable, she is tolerating diet and is symptom-free, UA chest x-ray unremarkable with discharged home on Levemir 10 units twice a day along with sliding scale insulin pre-meals and at nighttime. Request SNF staff to kindly check  CBGs before every meal QHS and monitor in a log book on a regular basis. SNF M.D. please monitor glycemic control closely.  2. Essential hypertension. Stable continue Norvasc and monitor blood pressure.  3. Dementia. Moderate. At risk for delirium, continue combination of Aricept, Abilify minimize narcotics and benzodiazepines.  4. Dyslipidemia. Continue statin.  5. History of CAD and TIA. No acute issues, continue statin for secondary prevention. She is not on any antiplatelet medications at this time. Symptom-free.  6. GERD. On PPI.  7. Hypothyroidism. Continue Synthroid, check baseline TSH.  8. Anxiety and depression. Continue home dose Ativan, Zoloft and Lamictal.   Discharge diagnosis     Principal Problem:   DKA (diabetic ketoacidoses) (HCC) Active Problems:   CAD s/p stent to mid-RCA in 2001.   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)   Dementia   Chronic pain syndrome   Anxiety state   DKA, type 1 [facility](HCC)    Discharge instructions    Discharge Instructions    Discharge instructions    Complete by:  As directed   Follow with Primary MD Julian HySOUTH,STEPHEN ALAN, MD in 7  days   Get CBC, CMP, 2 view Chest X ray checked  by Primary MD or SNF MD in 5-7 days ( we routinely change or add medications that can affect your baseline labs and fluid status, therefore we recommend that you get the mentioned basic workup next visit with your PCP, your PCP may decide not to get them or add new tests based on their clinical decision)   Activity: As tolerated with Full fall precautions use walker/cane & assistance as needed   Disposition SNF   Diet:   Heart Healthy Low Carb.  Accuchecks 4 times/day, Once in AM empty stomach and then before each meal. Log in all results and show them to your Prim.MD in 3 days. If any glucose reading is under 80 or above 300 call your Prim  MD immidiately. Follow Low glucose instructions for glucose under 80 as instructed.   For Heart failure patients - Check your Weight same time everyday, if you gain over 2 pounds, or you develop in leg swelling, experience more shortness of breath or chest pain, call your Primary MD immediately. Follow Cardiac Low Salt Diet and 1.5 lit/day fluid restriction.   On your next visit with your primary care physician please Get Medicines reviewed and adjusted.   Please request your Prim.MD to go over all [facility] Tests and Procedure/Radiological results at the follow up, please get all [facility] records sent to your Prim MD by signing [facility] release before you go home.   If you experience worsening of your admission symptoms, develop shortness of breath, life threatening emergency, suicidal or homicidal thoughts you must seek medical attention immediately by calling 911 or calling your MD immediately  if symptoms less severe.  You Must read complete instructions/literature along with all the possible adverse reactions/side effects for all the Medicines you take and that have been prescribed to you. Take any new Medicines after you have completely understood and accpet all the possible adverse reactions/side effects.   Do not drive, operate heavy machinery, perform activities at heights, swimming or participation in water activities or provide baby sitting services if your were admitted for syncope or siezures until you have seen by Primary MD or a Neurologist and advised to do so again.  Do not drive when taking Pain medications.    Do not take more than prescribed Pain, Sleep and Anxiety Medications  Special Instructions: If you have smoked or chewed Tobacco  in the last 2 yrs please stop smoking, stop any regular Alcohol  and or any Recreational drug use.  Wear Seat belts while driving.   Please note  You were cared for by a hospitalist during your [facility] stay. If you have any questions  about your discharge medications or the care you received while you were in the [facility] after you are discharged, you can call the unit and asked to speak with the hospitalist on call if the hospitalist that took care of you is not available. Once you are discharged, your primary care physician will handle any further medical issues. Please note that NO REFILLS for any discharge medications will be authorized once you are discharged, as it is imperative that you return to your primary care physician (or establish a relationship with a primary care physician if you do not have one) for your aftercare needs so that they can reassess your need for medications and monitor your lab values.   Increase activity slowly    Complete by:  As directed  Discharge Medications     Medication List    TAKE these medications   amLODipine 10 MG tablet Commonly known as:  NORVASC Take 1 tablet (10 mg total) by mouth daily.   ARIPiprazole 5 MG tablet Commonly known as:  ABILIFY Take 1 tablet (5 mg total) by mouth every morning. What changed:  when to take this   bisacodyl 5 MG EC tablet Commonly known as:  DULCOLAX Take 10 mg by mouth daily as needed for mild constipation or moderate constipation.   cholecalciferol 1000 units tablet Commonly known as:  VITAMIN D Take 2,000 Units by mouth daily.   donepezil 10 MG tablet Commonly known as:  ARICEPT Take 10 mg by mouth at bedtime.   feeding supplement (ENSURE ENLIVE) Liqd Take 237 mLs by mouth 3 (three) times daily with meals.   feeding supplement (PRO-STAT SUGAR FREE 64) Liqd Take 30 mLs by mouth 2 (two) times daily with a meal.   ferrous sulfate 325 (65 FE) MG tablet Take 325 mg by mouth 2 (two) times daily.   insulin aspart 100 UNIT/ML injection Commonly known as:  novoLOG Inject at bedtime based upon sugars.  Sugar 0-120--no units; 121-150--1 unit;  151-200--2 units;  201-250--3 units; 251-300--5 units;  301-350--7 units;  351-400--9  units What changed:  how much to take  how to take this  when to take this  additional instructions   insulin detemir 100 UNIT/ML injection Commonly known as:  LEVEMIR Inject 0.1 mLs (10 Units total) into the skin 2 (two) times daily. What changed:  how much to take   lamoTRIgine 25 MG tablet Commonly known as:  LAMICTAL Take 50 mg by mouth at bedtime.   levothyroxine 50 MCG tablet Commonly known as:  SYNTHROID, LEVOTHROID Take 50 mcg by mouth daily before breakfast.   LORazepam 0.5 MG tablet Commonly known as:  ATIVAN Take 1 tablet (0.5 mg total) by mouth at bedtime. Anxiety   metoCLOPramide 5 MG tablet Commonly known as:  REGLAN Take 1 tablet (5 mg total) by mouth 3 (three) times daily before meals. What changed:  additional instructions   pantoprazole 40 MG tablet Commonly known as:  PROTONIX Take 1 tablet (40 mg total) by mouth 2 (two) times daily.   polyethylene glycol packet Commonly known as:  MIRALAX / GLYCOLAX Take 17 g by mouth daily. What changed:  additional instructions   prazosin 1 MG capsule Commonly known as:  MINIPRESS Take 1 mg by mouth at bedtime.   rosuvastatin 5 MG tablet Commonly known as:  CRESTOR Take 5 mg by mouth daily at 6 PM.   sertraline 50 MG tablet Commonly known as:  ZOLOFT Take 50 mg by mouth 3 (three) times daily.   traMADol 50 MG tablet Commonly known as:  ULTRAM Take 1 tablet (50 mg total) by mouth every 6 (six) hours as needed for moderate pain.   UNABLE TO FIND Med Pass 1.7 Sugar-free: 120 ml's by mouth three times a day with meals       Follow-up Information    [name], MD. Schedule an appointment as soon as possible for a visit in 1 week(s).   Specialty:  Endocrinology Contact information: [address] [place] [address] [phone]           Major procedures and Radiology Reports - PLEASE review detailed and final reports thoroughly  -        Dg Chest 1 View  Result Date:  02/02/2016 CLINICAL DATA:  Shortness of  breath.  Hyperglycemia. EXAM: CHEST 1 VIEW COMPARISON:  11/20/2015 FINDINGS: Mild cardiac enlargement. Coronary artery stent. Central and perihilar interstitial changes with peribronchial thickening likely due to chronic bronchitis. No focal airspace disease or consolidation in the lungs. No blunting of costophrenic angles. No pneumothorax. Postoperative changes in the cervical spine. Degenerative changes in the spine and shoulders. Previous kyphoplasty changes at L1. IMPRESSION: Chronic bronchitic changes in the lungs. Mild cardiac enlargement. No evidence of active pulmonary disease. Electronically Signed   By: Burman Nieves M.D.   On: 02/02/2016 18:29    Micro Results    No results found for this or any previous visit (from the past 240 hour(s)).  Today   Subjective    Nickcole Bralley today has no headache,no chest abdominal pain,no new weakness tingling or numbness, feels much better wants to go home today.    Objective   Blood pressure (!) 142/63, pulse 69, temperature 98.4 F (36.9 C), temperature source Oral, resp. rate 15, height 5\' 2"  (1.575 m), weight 43.9 kg (96 lb 12.5 oz), SpO2 99 %.   Intake/Output Summary (Last 24 hours) at 02/03/16 0949 Last data filed at 02/03/16 0300  Gross per 24 hour  Intake           140.46 ml  Output                0 ml  Net           140.46 ml    Exam Awake , Mildly confused oriented 2, No new F.N deficits, Normal affect [place].AT,PERRAL Supple Neck,No JVD, No cervical lymphadenopathy appriciated.  Symmetrical Chest wall movement, Good air movement bilaterally, CTAB RRR,No Gallops,Rubs or new Murmurs, No Parasternal Heave +ve B.Sounds, Abd Soft, Non tender, No organomegaly appriciated, No rebound -guarding or rigidity. No Cyanosis, Clubbing or edema, No new Rash or bruise   Data Review   CBC w Diff:  Lab Results  Component Value Date   WBC 9.0 02/02/2016   HGB 10.2 (L) 02/02/2016   HCT 32.3 (L)  02/02/2016   PLT 254 02/02/2016   LYMPHOPCT 15 01/19/2016   MONOPCT 3 01/19/2016   EOSPCT 0 01/19/2016   BASOPCT 0 01/19/2016    CMP:  Lab Results  Component Value Date   NA 137 02/03/2016   NA 140 02/28/2015   K 3.3 (L) 02/03/2016   CL 107 02/03/2016   CO2 24 02/03/2016   BUN 10 02/03/2016   BUN 12 02/28/2015   CREATININE 0.48 02/03/2016   GLU 85 02/28/2015   PROT 7.3 01/19/2016   ALBUMIN 4.7 01/19/2016   BILITOT 1.6 (H) 01/19/2016   ALKPHOS 107 01/19/2016   AST 35 01/19/2016   ALT 56 (H) 01/19/2016  .   Total Time in preparing paper work, data evaluation and todays exam - 35 minutes  Leroy Sea M.D on 02/03/2016 at 9:49 AM  [facility]   Office  [phone]

## 2016-02-04 DIAGNOSIS — E872 Acidosis: Secondary | ICD-10-CM | POA: Diagnosis not present

## 2016-02-04 DIAGNOSIS — I10 Essential (primary) hypertension: Secondary | ICD-10-CM | POA: Diagnosis not present

## 2016-02-04 DIAGNOSIS — E785 Hyperlipidemia, unspecified: Secondary | ICD-10-CM | POA: Diagnosis not present

## 2016-02-04 DIAGNOSIS — E1010 Type 1 diabetes mellitus with ketoacidosis without coma: Secondary | ICD-10-CM | POA: Diagnosis not present

## 2016-02-04 DIAGNOSIS — D649 Anemia, unspecified: Secondary | ICD-10-CM | POA: Diagnosis not present

## 2016-02-04 LAB — HEMOGLOBIN A1C
Hgb A1c MFr Bld: 9 % — ABNORMAL HIGH (ref 4.8–5.6)
Mean Plasma Glucose: 212 mg/dL

## 2016-02-11 DIAGNOSIS — F0390 Unspecified dementia without behavioral disturbance: Secondary | ICD-10-CM | POA: Diagnosis not present

## 2016-02-11 DIAGNOSIS — I10 Essential (primary) hypertension: Secondary | ICD-10-CM | POA: Diagnosis not present

## 2016-02-11 DIAGNOSIS — E162 Hypoglycemia, unspecified: Secondary | ICD-10-CM | POA: Diagnosis not present

## 2016-02-11 DIAGNOSIS — E161 Other hypoglycemia: Secondary | ICD-10-CM | POA: Diagnosis not present

## 2016-02-11 DIAGNOSIS — E872 Acidosis: Secondary | ICD-10-CM | POA: Diagnosis not present

## 2016-02-11 DIAGNOSIS — D649 Anemia, unspecified: Secondary | ICD-10-CM | POA: Diagnosis not present

## 2016-02-11 DIAGNOSIS — E1010 Type 1 diabetes mellitus with ketoacidosis without coma: Secondary | ICD-10-CM | POA: Diagnosis not present

## 2016-02-12 DIAGNOSIS — E039 Hypothyroidism, unspecified: Secondary | ICD-10-CM | POA: Diagnosis not present

## 2016-02-12 DIAGNOSIS — E119 Type 2 diabetes mellitus without complications: Secondary | ICD-10-CM | POA: Diagnosis not present

## 2016-02-12 DIAGNOSIS — Z79899 Other long term (current) drug therapy: Secondary | ICD-10-CM | POA: Diagnosis not present

## 2016-02-12 DIAGNOSIS — I10 Essential (primary) hypertension: Secondary | ICD-10-CM | POA: Diagnosis not present

## 2016-02-12 DIAGNOSIS — E785 Hyperlipidemia, unspecified: Secondary | ICD-10-CM | POA: Diagnosis not present

## 2016-02-12 DIAGNOSIS — D649 Anemia, unspecified: Secondary | ICD-10-CM | POA: Diagnosis not present

## 2016-02-12 DIAGNOSIS — E559 Vitamin D deficiency, unspecified: Secondary | ICD-10-CM | POA: Diagnosis not present

## 2016-02-20 DIAGNOSIS — E162 Hypoglycemia, unspecified: Secondary | ICD-10-CM | POA: Diagnosis not present

## 2016-02-20 DIAGNOSIS — E1010 Type 1 diabetes mellitus with ketoacidosis without coma: Secondary | ICD-10-CM | POA: Diagnosis not present

## 2016-02-20 DIAGNOSIS — F0390 Unspecified dementia without behavioral disturbance: Secondary | ICD-10-CM | POA: Diagnosis not present

## 2016-02-20 DIAGNOSIS — I10 Essential (primary) hypertension: Secondary | ICD-10-CM | POA: Diagnosis not present

## 2016-02-20 DIAGNOSIS — E872 Acidosis: Secondary | ICD-10-CM | POA: Diagnosis not present

## 2016-03-04 DIAGNOSIS — F3181 Bipolar II disorder: Secondary | ICD-10-CM | POA: Diagnosis not present

## 2016-03-05 DIAGNOSIS — E785 Hyperlipidemia, unspecified: Secondary | ICD-10-CM | POA: Diagnosis not present

## 2016-03-05 DIAGNOSIS — E039 Hypothyroidism, unspecified: Secondary | ICD-10-CM | POA: Diagnosis not present

## 2016-03-05 DIAGNOSIS — E108 Type 1 diabetes mellitus with unspecified complications: Secondary | ICD-10-CM | POA: Diagnosis not present

## 2016-03-05 DIAGNOSIS — F0390 Unspecified dementia without behavioral disturbance: Secondary | ICD-10-CM | POA: Diagnosis not present

## 2016-03-26 DIAGNOSIS — I10 Essential (primary) hypertension: Secondary | ICD-10-CM | POA: Diagnosis not present

## 2016-03-26 DIAGNOSIS — E108 Type 1 diabetes mellitus with unspecified complications: Secondary | ICD-10-CM | POA: Diagnosis not present

## 2016-03-26 DIAGNOSIS — F0390 Unspecified dementia without behavioral disturbance: Secondary | ICD-10-CM | POA: Diagnosis not present

## 2016-03-26 DIAGNOSIS — D649 Anemia, unspecified: Secondary | ICD-10-CM | POA: Diagnosis not present

## 2016-03-30 DIAGNOSIS — M6281 Muscle weakness (generalized): Secondary | ICD-10-CM | POA: Diagnosis not present

## 2016-03-31 DIAGNOSIS — M6281 Muscle weakness (generalized): Secondary | ICD-10-CM | POA: Diagnosis not present

## 2016-04-01 DIAGNOSIS — M6281 Muscle weakness (generalized): Secondary | ICD-10-CM | POA: Diagnosis not present

## 2016-04-02 DIAGNOSIS — I2510 Atherosclerotic heart disease of native coronary artery without angina pectoris: Secondary | ICD-10-CM | POA: Diagnosis not present

## 2016-04-02 DIAGNOSIS — E871 Hypo-osmolality and hyponatremia: Secondary | ICD-10-CM | POA: Diagnosis not present

## 2016-04-02 DIAGNOSIS — L89152 Pressure ulcer of sacral region, stage 2: Secondary | ICD-10-CM | POA: Diagnosis not present

## 2016-04-02 DIAGNOSIS — E113599 Type 2 diabetes mellitus with proliferative diabetic retinopathy without macular edema, unspecified eye: Secondary | ICD-10-CM | POA: Diagnosis not present

## 2016-04-02 DIAGNOSIS — E784 Other hyperlipidemia: Secondary | ICD-10-CM | POA: Diagnosis not present

## 2016-04-02 DIAGNOSIS — G9349 Other encephalopathy: Secondary | ICD-10-CM | POA: Diagnosis not present

## 2016-04-02 DIAGNOSIS — E1129 Type 2 diabetes mellitus with other diabetic kidney complication: Secondary | ICD-10-CM | POA: Diagnosis not present

## 2016-04-02 DIAGNOSIS — K3184 Gastroparesis: Secondary | ICD-10-CM | POA: Diagnosis not present

## 2016-04-02 DIAGNOSIS — R1084 Generalized abdominal pain: Secondary | ICD-10-CM | POA: Diagnosis not present

## 2016-04-02 DIAGNOSIS — F0390 Unspecified dementia without behavioral disturbance: Secondary | ICD-10-CM | POA: Diagnosis not present

## 2016-04-02 DIAGNOSIS — N179 Acute kidney failure, unspecified: Secondary | ICD-10-CM | POA: Diagnosis not present

## 2016-04-02 DIAGNOSIS — R569 Unspecified convulsions: Secondary | ICD-10-CM | POA: Diagnosis not present

## 2016-04-03 DIAGNOSIS — M6281 Muscle weakness (generalized): Secondary | ICD-10-CM | POA: Diagnosis not present

## 2016-04-04 DIAGNOSIS — M6281 Muscle weakness (generalized): Secondary | ICD-10-CM | POA: Diagnosis not present

## 2016-04-05 DIAGNOSIS — M6281 Muscle weakness (generalized): Secondary | ICD-10-CM | POA: Diagnosis not present

## 2016-04-06 DIAGNOSIS — M6281 Muscle weakness (generalized): Secondary | ICD-10-CM | POA: Diagnosis not present

## 2016-04-07 DIAGNOSIS — M6281 Muscle weakness (generalized): Secondary | ICD-10-CM | POA: Diagnosis not present

## 2016-04-08 DIAGNOSIS — F3181 Bipolar II disorder: Secondary | ICD-10-CM | POA: Diagnosis not present

## 2016-04-08 DIAGNOSIS — M6281 Muscle weakness (generalized): Secondary | ICD-10-CM | POA: Diagnosis not present

## 2016-04-09 DIAGNOSIS — E108 Type 1 diabetes mellitus with unspecified complications: Secondary | ICD-10-CM | POA: Diagnosis not present

## 2016-04-09 DIAGNOSIS — F0390 Unspecified dementia without behavioral disturbance: Secondary | ICD-10-CM | POA: Diagnosis not present

## 2016-04-09 DIAGNOSIS — I10 Essential (primary) hypertension: Secondary | ICD-10-CM | POA: Diagnosis not present

## 2016-04-09 DIAGNOSIS — M6281 Muscle weakness (generalized): Secondary | ICD-10-CM | POA: Diagnosis not present

## 2016-04-09 DIAGNOSIS — W19XXXD Unspecified fall, subsequent encounter: Secondary | ICD-10-CM | POA: Diagnosis not present

## 2016-04-12 DIAGNOSIS — M6281 Muscle weakness (generalized): Secondary | ICD-10-CM | POA: Diagnosis not present

## 2016-04-13 DIAGNOSIS — M6281 Muscle weakness (generalized): Secondary | ICD-10-CM | POA: Diagnosis not present

## 2016-04-14 DIAGNOSIS — M6281 Muscle weakness (generalized): Secondary | ICD-10-CM | POA: Diagnosis not present

## 2016-04-15 DIAGNOSIS — M6281 Muscle weakness (generalized): Secondary | ICD-10-CM | POA: Diagnosis not present

## 2016-04-16 DIAGNOSIS — M6281 Muscle weakness (generalized): Secondary | ICD-10-CM | POA: Diagnosis not present

## 2016-04-19 DIAGNOSIS — M6281 Muscle weakness (generalized): Secondary | ICD-10-CM | POA: Diagnosis not present

## 2016-04-20 DIAGNOSIS — M6281 Muscle weakness (generalized): Secondary | ICD-10-CM | POA: Diagnosis not present

## 2016-04-21 DIAGNOSIS — M6281 Muscle weakness (generalized): Secondary | ICD-10-CM | POA: Diagnosis not present

## 2016-04-22 DIAGNOSIS — M6281 Muscle weakness (generalized): Secondary | ICD-10-CM | POA: Diagnosis not present

## 2016-04-23 DIAGNOSIS — M6281 Muscle weakness (generalized): Secondary | ICD-10-CM | POA: Diagnosis not present

## 2016-04-26 DIAGNOSIS — M6281 Muscle weakness (generalized): Secondary | ICD-10-CM | POA: Diagnosis not present

## 2016-04-27 DIAGNOSIS — M6281 Muscle weakness (generalized): Secondary | ICD-10-CM | POA: Diagnosis not present

## 2016-05-06 DIAGNOSIS — D649 Anemia, unspecified: Secondary | ICD-10-CM | POA: Diagnosis not present

## 2016-05-07 DIAGNOSIS — E119 Type 2 diabetes mellitus without complications: Secondary | ICD-10-CM | POA: Diagnosis not present

## 2016-05-11 DIAGNOSIS — R05 Cough: Secondary | ICD-10-CM | POA: Diagnosis not present

## 2016-05-11 DIAGNOSIS — E108 Type 1 diabetes mellitus with unspecified complications: Secondary | ICD-10-CM | POA: Diagnosis not present

## 2016-05-11 DIAGNOSIS — I10 Essential (primary) hypertension: Secondary | ICD-10-CM | POA: Diagnosis not present

## 2016-05-11 DIAGNOSIS — F0390 Unspecified dementia without behavioral disturbance: Secondary | ICD-10-CM | POA: Diagnosis not present

## 2016-05-21 DIAGNOSIS — R1084 Generalized abdominal pain: Secondary | ICD-10-CM | POA: Diagnosis not present

## 2016-05-21 DIAGNOSIS — E103599 Type 1 diabetes mellitus with proliferative diabetic retinopathy without macular edema, unspecified eye: Secondary | ICD-10-CM | POA: Diagnosis not present

## 2016-05-21 DIAGNOSIS — R569 Unspecified convulsions: Secondary | ICD-10-CM | POA: Diagnosis not present

## 2016-05-21 DIAGNOSIS — N179 Acute kidney failure, unspecified: Secondary | ICD-10-CM | POA: Diagnosis not present

## 2016-05-21 DIAGNOSIS — I2510 Atherosclerotic heart disease of native coronary artery without angina pectoris: Secondary | ICD-10-CM | POA: Diagnosis not present

## 2016-05-21 DIAGNOSIS — E784 Other hyperlipidemia: Secondary | ICD-10-CM | POA: Diagnosis not present

## 2016-05-21 DIAGNOSIS — E441 Mild protein-calorie malnutrition: Secondary | ICD-10-CM | POA: Diagnosis not present

## 2016-05-21 DIAGNOSIS — E1129 Type 2 diabetes mellitus with other diabetic kidney complication: Secondary | ICD-10-CM | POA: Diagnosis not present

## 2016-05-21 DIAGNOSIS — E871 Hypo-osmolality and hyponatremia: Secondary | ICD-10-CM | POA: Diagnosis not present

## 2016-05-21 DIAGNOSIS — L89152 Pressure ulcer of sacral region, stage 2: Secondary | ICD-10-CM | POA: Diagnosis not present

## 2016-05-21 DIAGNOSIS — D6489 Other specified anemias: Secondary | ICD-10-CM | POA: Diagnosis not present

## 2016-05-21 DIAGNOSIS — G9349 Other encephalopathy: Secondary | ICD-10-CM | POA: Diagnosis not present

## 2016-05-22 DIAGNOSIS — D649 Anemia, unspecified: Secondary | ICD-10-CM | POA: Diagnosis not present

## 2016-05-22 DIAGNOSIS — I10 Essential (primary) hypertension: Secondary | ICD-10-CM | POA: Diagnosis not present

## 2016-05-26 DIAGNOSIS — D649 Anemia, unspecified: Secondary | ICD-10-CM | POA: Diagnosis not present

## 2016-05-27 DIAGNOSIS — H401131 Primary open-angle glaucoma, bilateral, mild stage: Secondary | ICD-10-CM | POA: Diagnosis not present

## 2016-05-27 DIAGNOSIS — H04123 Dry eye syndrome of bilateral lacrimal glands: Secondary | ICD-10-CM | POA: Diagnosis not present

## 2016-07-08 DIAGNOSIS — E785 Hyperlipidemia, unspecified: Secondary | ICD-10-CM | POA: Diagnosis not present

## 2016-07-08 DIAGNOSIS — E039 Hypothyroidism, unspecified: Secondary | ICD-10-CM | POA: Diagnosis not present

## 2016-07-08 DIAGNOSIS — E108 Type 1 diabetes mellitus with unspecified complications: Secondary | ICD-10-CM | POA: Diagnosis not present

## 2016-07-08 DIAGNOSIS — I10 Essential (primary) hypertension: Secondary | ICD-10-CM | POA: Diagnosis not present

## 2016-07-21 DIAGNOSIS — F0390 Unspecified dementia without behavioral disturbance: Secondary | ICD-10-CM | POA: Diagnosis not present

## 2016-07-21 DIAGNOSIS — E119 Type 2 diabetes mellitus without complications: Secondary | ICD-10-CM | POA: Diagnosis not present

## 2016-07-21 DIAGNOSIS — M810 Age-related osteoporosis without current pathological fracture: Secondary | ICD-10-CM | POA: Diagnosis not present

## 2016-07-21 DIAGNOSIS — Z66 Do not resuscitate: Secondary | ICD-10-CM | POA: Diagnosis not present

## 2016-07-21 DIAGNOSIS — F329 Major depressive disorder, single episode, unspecified: Secondary | ICD-10-CM | POA: Diagnosis not present

## 2016-07-21 DIAGNOSIS — I10 Essential (primary) hypertension: Secondary | ICD-10-CM | POA: Diagnosis not present

## 2016-07-23 DIAGNOSIS — E162 Hypoglycemia, unspecified: Secondary | ICD-10-CM | POA: Diagnosis not present

## 2016-07-23 DIAGNOSIS — F0390 Unspecified dementia without behavioral disturbance: Secondary | ICD-10-CM | POA: Diagnosis not present

## 2016-07-23 DIAGNOSIS — E108 Type 1 diabetes mellitus with unspecified complications: Secondary | ICD-10-CM | POA: Diagnosis not present

## 2016-07-23 DIAGNOSIS — I10 Essential (primary) hypertension: Secondary | ICD-10-CM | POA: Diagnosis not present

## 2016-08-09 DIAGNOSIS — D649 Anemia, unspecified: Secondary | ICD-10-CM | POA: Diagnosis not present

## 2016-08-09 DIAGNOSIS — E039 Hypothyroidism, unspecified: Secondary | ICD-10-CM | POA: Diagnosis not present

## 2016-08-09 DIAGNOSIS — I10 Essential (primary) hypertension: Secondary | ICD-10-CM | POA: Diagnosis not present

## 2016-08-09 DIAGNOSIS — E119 Type 2 diabetes mellitus without complications: Secondary | ICD-10-CM | POA: Diagnosis not present

## 2016-08-09 DIAGNOSIS — E559 Vitamin D deficiency, unspecified: Secondary | ICD-10-CM | POA: Diagnosis not present

## 2016-08-09 DIAGNOSIS — K769 Liver disease, unspecified: Secondary | ICD-10-CM | POA: Diagnosis not present

## 2016-08-09 DIAGNOSIS — E785 Hyperlipidemia, unspecified: Secondary | ICD-10-CM | POA: Diagnosis not present

## 2016-08-13 DIAGNOSIS — J984 Other disorders of lung: Secondary | ICD-10-CM | POA: Diagnosis not present

## 2016-08-13 DIAGNOSIS — I10 Essential (primary) hypertension: Secondary | ICD-10-CM | POA: Diagnosis not present

## 2016-08-13 DIAGNOSIS — E108 Type 1 diabetes mellitus with unspecified complications: Secondary | ICD-10-CM | POA: Diagnosis not present

## 2016-08-13 DIAGNOSIS — F0390 Unspecified dementia without behavioral disturbance: Secondary | ICD-10-CM | POA: Diagnosis not present

## 2016-09-03 DIAGNOSIS — N179 Acute kidney failure, unspecified: Secondary | ICD-10-CM | POA: Diagnosis not present

## 2016-09-03 DIAGNOSIS — R278 Other lack of coordination: Secondary | ICD-10-CM | POA: Diagnosis not present

## 2016-09-03 DIAGNOSIS — M6281 Muscle weakness (generalized): Secondary | ICD-10-CM | POA: Diagnosis not present

## 2016-09-03 DIAGNOSIS — R2689 Other abnormalities of gait and mobility: Secondary | ICD-10-CM | POA: Diagnosis not present

## 2016-09-06 DIAGNOSIS — M6281 Muscle weakness (generalized): Secondary | ICD-10-CM | POA: Diagnosis not present

## 2016-09-06 DIAGNOSIS — R2689 Other abnormalities of gait and mobility: Secondary | ICD-10-CM | POA: Diagnosis not present

## 2016-09-06 DIAGNOSIS — N179 Acute kidney failure, unspecified: Secondary | ICD-10-CM | POA: Diagnosis not present

## 2016-09-06 DIAGNOSIS — R278 Other lack of coordination: Secondary | ICD-10-CM | POA: Diagnosis not present

## 2016-09-07 DIAGNOSIS — M6281 Muscle weakness (generalized): Secondary | ICD-10-CM | POA: Diagnosis not present

## 2016-09-07 DIAGNOSIS — N179 Acute kidney failure, unspecified: Secondary | ICD-10-CM | POA: Diagnosis not present

## 2016-09-07 DIAGNOSIS — R278 Other lack of coordination: Secondary | ICD-10-CM | POA: Diagnosis not present

## 2016-09-07 DIAGNOSIS — R2689 Other abnormalities of gait and mobility: Secondary | ICD-10-CM | POA: Diagnosis not present

## 2016-09-08 DIAGNOSIS — M6281 Muscle weakness (generalized): Secondary | ICD-10-CM | POA: Diagnosis not present

## 2016-09-08 DIAGNOSIS — R2689 Other abnormalities of gait and mobility: Secondary | ICD-10-CM | POA: Diagnosis not present

## 2016-09-08 DIAGNOSIS — N179 Acute kidney failure, unspecified: Secondary | ICD-10-CM | POA: Diagnosis not present

## 2016-09-08 DIAGNOSIS — R278 Other lack of coordination: Secondary | ICD-10-CM | POA: Diagnosis not present

## 2016-09-09 DIAGNOSIS — R278 Other lack of coordination: Secondary | ICD-10-CM | POA: Diagnosis not present

## 2016-09-09 DIAGNOSIS — R2689 Other abnormalities of gait and mobility: Secondary | ICD-10-CM | POA: Diagnosis not present

## 2016-09-09 DIAGNOSIS — M6281 Muscle weakness (generalized): Secondary | ICD-10-CM | POA: Diagnosis not present

## 2016-09-09 DIAGNOSIS — N179 Acute kidney failure, unspecified: Secondary | ICD-10-CM | POA: Diagnosis not present

## 2016-09-10 DIAGNOSIS — R278 Other lack of coordination: Secondary | ICD-10-CM | POA: Diagnosis not present

## 2016-09-10 DIAGNOSIS — M6281 Muscle weakness (generalized): Secondary | ICD-10-CM | POA: Diagnosis not present

## 2016-09-10 DIAGNOSIS — R2689 Other abnormalities of gait and mobility: Secondary | ICD-10-CM | POA: Diagnosis not present

## 2016-09-10 DIAGNOSIS — N179 Acute kidney failure, unspecified: Secondary | ICD-10-CM | POA: Diagnosis not present

## 2016-09-13 DIAGNOSIS — R278 Other lack of coordination: Secondary | ICD-10-CM | POA: Diagnosis not present

## 2016-09-13 DIAGNOSIS — N179 Acute kidney failure, unspecified: Secondary | ICD-10-CM | POA: Diagnosis not present

## 2016-09-13 DIAGNOSIS — M6281 Muscle weakness (generalized): Secondary | ICD-10-CM | POA: Diagnosis not present

## 2016-09-13 DIAGNOSIS — R2689 Other abnormalities of gait and mobility: Secondary | ICD-10-CM | POA: Diagnosis not present

## 2016-09-14 DIAGNOSIS — R2689 Other abnormalities of gait and mobility: Secondary | ICD-10-CM | POA: Diagnosis not present

## 2016-09-14 DIAGNOSIS — M6281 Muscle weakness (generalized): Secondary | ICD-10-CM | POA: Diagnosis not present

## 2016-09-14 DIAGNOSIS — N179 Acute kidney failure, unspecified: Secondary | ICD-10-CM | POA: Diagnosis not present

## 2016-09-14 DIAGNOSIS — R278 Other lack of coordination: Secondary | ICD-10-CM | POA: Diagnosis not present

## 2016-09-15 DIAGNOSIS — N179 Acute kidney failure, unspecified: Secondary | ICD-10-CM | POA: Diagnosis not present

## 2016-09-15 DIAGNOSIS — F0390 Unspecified dementia without behavioral disturbance: Secondary | ICD-10-CM | POA: Diagnosis not present

## 2016-09-15 DIAGNOSIS — R278 Other lack of coordination: Secondary | ICD-10-CM | POA: Diagnosis not present

## 2016-09-15 DIAGNOSIS — R2689 Other abnormalities of gait and mobility: Secondary | ICD-10-CM | POA: Diagnosis not present

## 2016-09-15 DIAGNOSIS — E119 Type 2 diabetes mellitus without complications: Secondary | ICD-10-CM | POA: Diagnosis not present

## 2016-09-15 DIAGNOSIS — I10 Essential (primary) hypertension: Secondary | ICD-10-CM | POA: Diagnosis not present

## 2016-09-15 DIAGNOSIS — M810 Age-related osteoporosis without current pathological fracture: Secondary | ICD-10-CM | POA: Diagnosis not present

## 2016-09-15 DIAGNOSIS — M6281 Muscle weakness (generalized): Secondary | ICD-10-CM | POA: Diagnosis not present

## 2016-09-15 DIAGNOSIS — I2510 Atherosclerotic heart disease of native coronary artery without angina pectoris: Secondary | ICD-10-CM | POA: Diagnosis not present

## 2016-09-15 DIAGNOSIS — Z66 Do not resuscitate: Secondary | ICD-10-CM | POA: Diagnosis not present

## 2016-09-16 DIAGNOSIS — N179 Acute kidney failure, unspecified: Secondary | ICD-10-CM | POA: Diagnosis not present

## 2016-09-16 DIAGNOSIS — R2689 Other abnormalities of gait and mobility: Secondary | ICD-10-CM | POA: Diagnosis not present

## 2016-09-16 DIAGNOSIS — R278 Other lack of coordination: Secondary | ICD-10-CM | POA: Diagnosis not present

## 2016-09-16 DIAGNOSIS — M6281 Muscle weakness (generalized): Secondary | ICD-10-CM | POA: Diagnosis not present

## 2016-09-17 DIAGNOSIS — R2689 Other abnormalities of gait and mobility: Secondary | ICD-10-CM | POA: Diagnosis not present

## 2016-09-17 DIAGNOSIS — N179 Acute kidney failure, unspecified: Secondary | ICD-10-CM | POA: Diagnosis not present

## 2016-09-17 DIAGNOSIS — M6281 Muscle weakness (generalized): Secondary | ICD-10-CM | POA: Diagnosis not present

## 2016-09-17 DIAGNOSIS — R278 Other lack of coordination: Secondary | ICD-10-CM | POA: Diagnosis not present

## 2016-09-20 DIAGNOSIS — N179 Acute kidney failure, unspecified: Secondary | ICD-10-CM | POA: Diagnosis not present

## 2016-09-20 DIAGNOSIS — M6281 Muscle weakness (generalized): Secondary | ICD-10-CM | POA: Diagnosis not present

## 2016-09-20 DIAGNOSIS — R2689 Other abnormalities of gait and mobility: Secondary | ICD-10-CM | POA: Diagnosis not present

## 2016-09-20 DIAGNOSIS — R278 Other lack of coordination: Secondary | ICD-10-CM | POA: Diagnosis not present

## 2016-09-21 DIAGNOSIS — M6281 Muscle weakness (generalized): Secondary | ICD-10-CM | POA: Diagnosis not present

## 2016-09-21 DIAGNOSIS — R2689 Other abnormalities of gait and mobility: Secondary | ICD-10-CM | POA: Diagnosis not present

## 2016-09-21 DIAGNOSIS — N179 Acute kidney failure, unspecified: Secondary | ICD-10-CM | POA: Diagnosis not present

## 2016-09-21 DIAGNOSIS — R278 Other lack of coordination: Secondary | ICD-10-CM | POA: Diagnosis not present

## 2016-09-22 DIAGNOSIS — R278 Other lack of coordination: Secondary | ICD-10-CM | POA: Diagnosis not present

## 2016-09-22 DIAGNOSIS — M6281 Muscle weakness (generalized): Secondary | ICD-10-CM | POA: Diagnosis not present

## 2016-09-22 DIAGNOSIS — R2689 Other abnormalities of gait and mobility: Secondary | ICD-10-CM | POA: Diagnosis not present

## 2016-09-22 DIAGNOSIS — N179 Acute kidney failure, unspecified: Secondary | ICD-10-CM | POA: Diagnosis not present

## 2016-09-23 DIAGNOSIS — R2689 Other abnormalities of gait and mobility: Secondary | ICD-10-CM | POA: Diagnosis not present

## 2016-09-23 DIAGNOSIS — M6281 Muscle weakness (generalized): Secondary | ICD-10-CM | POA: Diagnosis not present

## 2016-09-23 DIAGNOSIS — R278 Other lack of coordination: Secondary | ICD-10-CM | POA: Diagnosis not present

## 2016-09-23 DIAGNOSIS — N179 Acute kidney failure, unspecified: Secondary | ICD-10-CM | POA: Diagnosis not present

## 2016-09-24 DIAGNOSIS — R2689 Other abnormalities of gait and mobility: Secondary | ICD-10-CM | POA: Diagnosis not present

## 2016-09-24 DIAGNOSIS — M6281 Muscle weakness (generalized): Secondary | ICD-10-CM | POA: Diagnosis not present

## 2016-09-24 DIAGNOSIS — N179 Acute kidney failure, unspecified: Secondary | ICD-10-CM | POA: Diagnosis not present

## 2016-09-24 DIAGNOSIS — R278 Other lack of coordination: Secondary | ICD-10-CM | POA: Diagnosis not present

## 2016-09-27 DIAGNOSIS — R278 Other lack of coordination: Secondary | ICD-10-CM | POA: Diagnosis not present

## 2016-09-27 DIAGNOSIS — N179 Acute kidney failure, unspecified: Secondary | ICD-10-CM | POA: Diagnosis not present

## 2016-09-27 DIAGNOSIS — R2689 Other abnormalities of gait and mobility: Secondary | ICD-10-CM | POA: Diagnosis not present

## 2016-09-27 DIAGNOSIS — M6281 Muscle weakness (generalized): Secondary | ICD-10-CM | POA: Diagnosis not present

## 2016-09-28 DIAGNOSIS — R278 Other lack of coordination: Secondary | ICD-10-CM | POA: Diagnosis not present

## 2016-09-28 DIAGNOSIS — M6281 Muscle weakness (generalized): Secondary | ICD-10-CM | POA: Diagnosis not present

## 2016-09-28 DIAGNOSIS — N179 Acute kidney failure, unspecified: Secondary | ICD-10-CM | POA: Diagnosis not present

## 2016-09-28 DIAGNOSIS — R2689 Other abnormalities of gait and mobility: Secondary | ICD-10-CM | POA: Diagnosis not present

## 2016-11-04 DIAGNOSIS — E119 Type 2 diabetes mellitus without complications: Secondary | ICD-10-CM | POA: Diagnosis not present

## 2016-11-10 DIAGNOSIS — Z66 Do not resuscitate: Secondary | ICD-10-CM | POA: Diagnosis not present

## 2016-11-10 DIAGNOSIS — E119 Type 2 diabetes mellitus without complications: Secondary | ICD-10-CM | POA: Diagnosis not present

## 2016-11-10 DIAGNOSIS — I2510 Atherosclerotic heart disease of native coronary artery without angina pectoris: Secondary | ICD-10-CM | POA: Diagnosis not present

## 2016-11-10 DIAGNOSIS — F339 Major depressive disorder, recurrent, unspecified: Secondary | ICD-10-CM | POA: Diagnosis not present

## 2017-01-05 DIAGNOSIS — M810 Age-related osteoporosis without current pathological fracture: Secondary | ICD-10-CM | POA: Diagnosis not present

## 2017-01-05 DIAGNOSIS — I10 Essential (primary) hypertension: Secondary | ICD-10-CM | POA: Diagnosis not present

## 2017-01-05 DIAGNOSIS — I2510 Atherosclerotic heart disease of native coronary artery without angina pectoris: Secondary | ICD-10-CM | POA: Diagnosis not present

## 2017-01-05 DIAGNOSIS — E119 Type 2 diabetes mellitus without complications: Secondary | ICD-10-CM | POA: Diagnosis not present

## 2017-01-05 DIAGNOSIS — Z66 Do not resuscitate: Secondary | ICD-10-CM | POA: Diagnosis not present

## 2017-01-05 DIAGNOSIS — F339 Major depressive disorder, recurrent, unspecified: Secondary | ICD-10-CM | POA: Diagnosis not present

## 2017-01-12 IMAGING — CR DG CHEST 1V PORT
1 series · 1 of 1 positions shown · non-contrast
Comparison: 09/08/2011

CLINICAL DATA: DKA

EXAM:
PORTABLE CHEST - 1 VIEW

[AP]
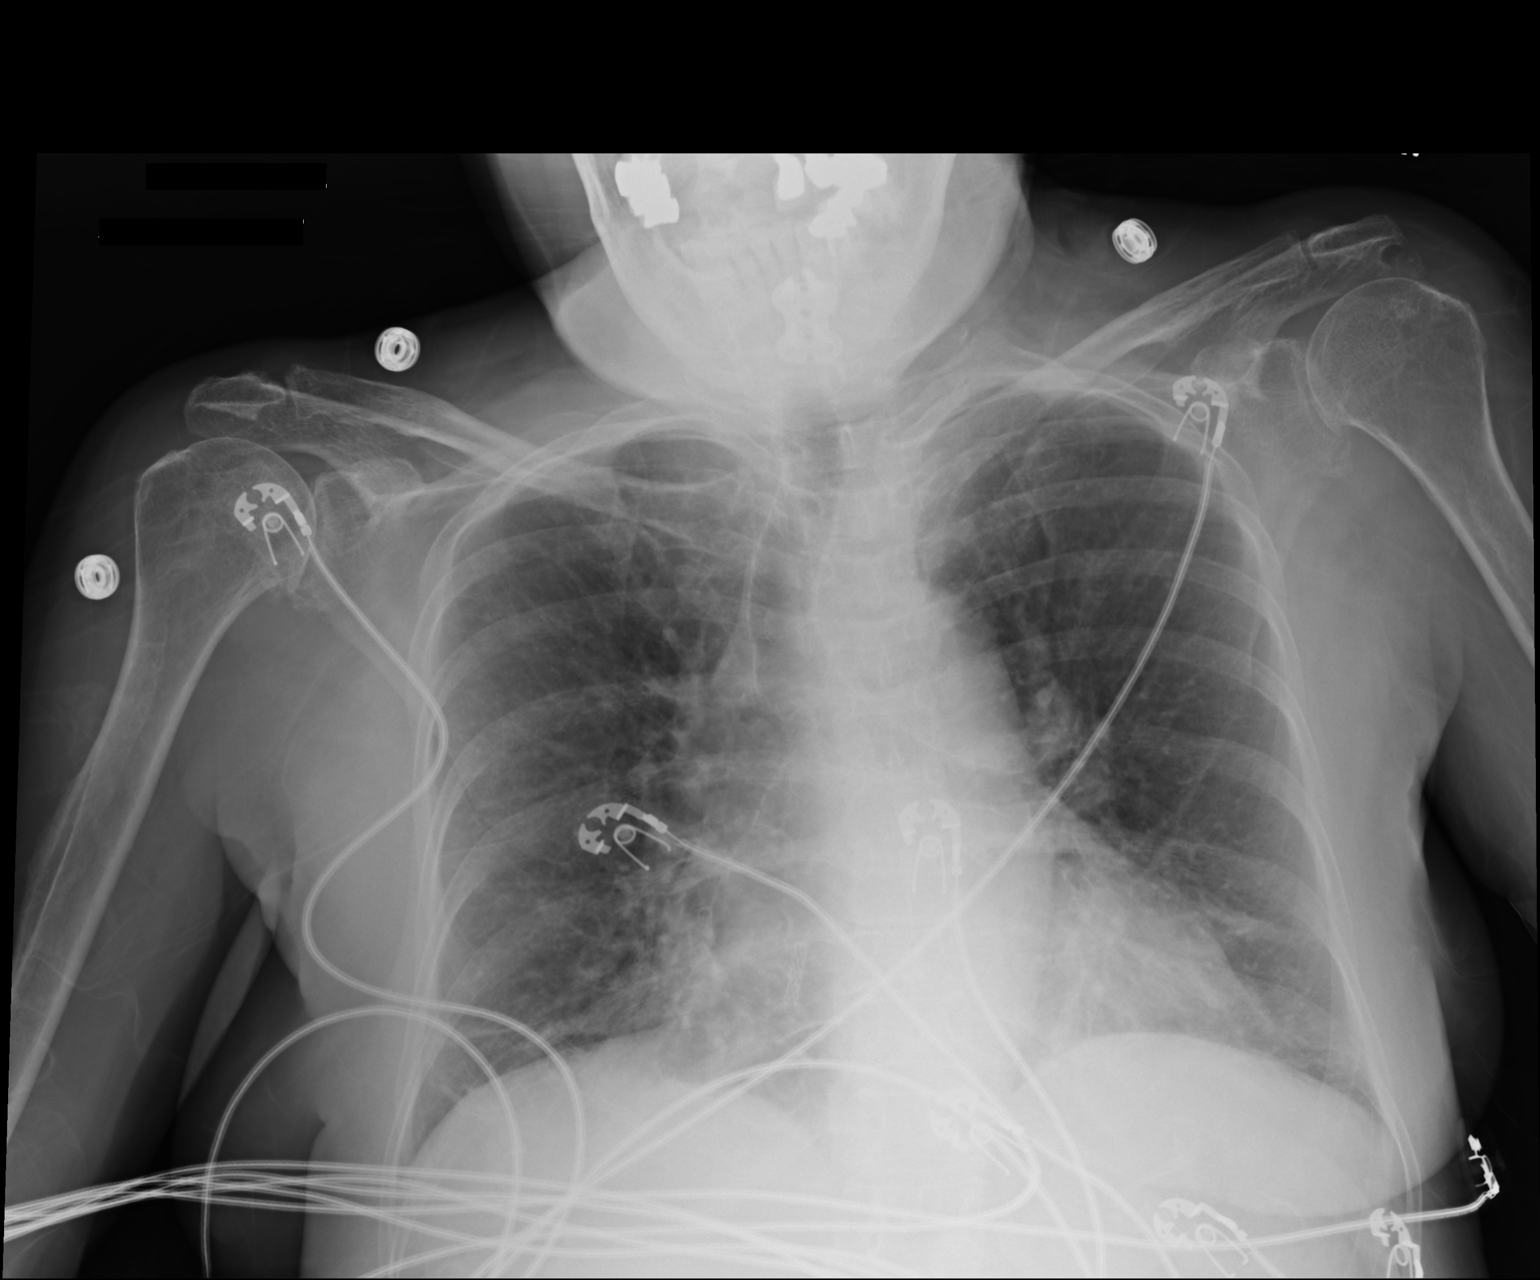

[1 of 1 positions shown; findings below may reference images not displayed]

FINDINGS: Mild cardiac enlargement. Right Coronary artery stent. Mild vascular
congestion without edema.

Mild bibasilar atelectasis/ infiltrate.  No effusion.
IMPRESSION: Mild vascular congestion without edema

Mild bibasilar atelectasis/infiltrate.

## 2017-02-12 DIAGNOSIS — I10 Essential (primary) hypertension: Secondary | ICD-10-CM | POA: Diagnosis not present

## 2017-02-12 DIAGNOSIS — E119 Type 2 diabetes mellitus without complications: Secondary | ICD-10-CM | POA: Diagnosis not present

## 2017-02-12 DIAGNOSIS — D649 Anemia, unspecified: Secondary | ICD-10-CM | POA: Diagnosis not present

## 2017-02-12 DIAGNOSIS — E785 Hyperlipidemia, unspecified: Secondary | ICD-10-CM | POA: Diagnosis not present

## 2017-02-12 DIAGNOSIS — Z79899 Other long term (current) drug therapy: Secondary | ICD-10-CM | POA: Diagnosis not present

## 2017-02-16 DIAGNOSIS — I10 Essential (primary) hypertension: Secondary | ICD-10-CM | POA: Diagnosis not present

## 2017-02-16 DIAGNOSIS — I2510 Atherosclerotic heart disease of native coronary artery without angina pectoris: Secondary | ICD-10-CM | POA: Diagnosis not present

## 2017-02-16 DIAGNOSIS — M810 Age-related osteoporosis without current pathological fracture: Secondary | ICD-10-CM | POA: Diagnosis not present

## 2017-02-16 DIAGNOSIS — E119 Type 2 diabetes mellitus without complications: Secondary | ICD-10-CM | POA: Diagnosis not present

## 2017-02-16 DIAGNOSIS — F329 Major depressive disorder, single episode, unspecified: Secondary | ICD-10-CM | POA: Diagnosis not present

## 2017-02-18 DIAGNOSIS — I10 Essential (primary) hypertension: Secondary | ICD-10-CM | POA: Diagnosis not present

## 2017-02-18 DIAGNOSIS — E039 Hypothyroidism, unspecified: Secondary | ICD-10-CM | POA: Diagnosis not present

## 2017-02-18 DIAGNOSIS — E108 Type 1 diabetes mellitus with unspecified complications: Secondary | ICD-10-CM | POA: Diagnosis not present

## 2017-02-18 DIAGNOSIS — W19XXXD Unspecified fall, subsequent encounter: Secondary | ICD-10-CM | POA: Diagnosis not present

## 2017-02-18 DIAGNOSIS — R079 Chest pain, unspecified: Secondary | ICD-10-CM | POA: Diagnosis not present

## 2017-03-14 DIAGNOSIS — D649 Anemia, unspecified: Secondary | ICD-10-CM | POA: Diagnosis not present

## 2017-03-14 DIAGNOSIS — E108 Type 1 diabetes mellitus with unspecified complications: Secondary | ICD-10-CM | POA: Diagnosis not present

## 2017-03-14 DIAGNOSIS — I10 Essential (primary) hypertension: Secondary | ICD-10-CM | POA: Diagnosis not present

## 2017-03-14 DIAGNOSIS — E039 Hypothyroidism, unspecified: Secondary | ICD-10-CM | POA: Diagnosis not present

## 2017-03-16 DIAGNOSIS — E785 Hyperlipidemia, unspecified: Secondary | ICD-10-CM | POA: Diagnosis not present

## 2017-03-16 DIAGNOSIS — E039 Hypothyroidism, unspecified: Secondary | ICD-10-CM | POA: Diagnosis not present

## 2017-03-16 DIAGNOSIS — I10 Essential (primary) hypertension: Secondary | ICD-10-CM | POA: Diagnosis not present

## 2017-03-16 DIAGNOSIS — E108 Type 1 diabetes mellitus with unspecified complications: Secondary | ICD-10-CM | POA: Diagnosis not present

## 2017-03-18 DIAGNOSIS — E039 Hypothyroidism, unspecified: Secondary | ICD-10-CM | POA: Diagnosis not present

## 2017-03-18 DIAGNOSIS — I10 Essential (primary) hypertension: Secondary | ICD-10-CM | POA: Diagnosis not present

## 2017-03-18 DIAGNOSIS — E108 Type 1 diabetes mellitus with unspecified complications: Secondary | ICD-10-CM | POA: Diagnosis not present

## 2017-03-18 DIAGNOSIS — F329 Major depressive disorder, single episode, unspecified: Secondary | ICD-10-CM | POA: Diagnosis not present

## 2017-04-07 DIAGNOSIS — R2689 Other abnormalities of gait and mobility: Secondary | ICD-10-CM | POA: Diagnosis not present

## 2017-04-07 DIAGNOSIS — M6281 Muscle weakness (generalized): Secondary | ICD-10-CM | POA: Diagnosis not present

## 2017-04-07 DIAGNOSIS — N179 Acute kidney failure, unspecified: Secondary | ICD-10-CM | POA: Diagnosis not present

## 2017-04-07 DIAGNOSIS — R278 Other lack of coordination: Secondary | ICD-10-CM | POA: Diagnosis not present

## 2017-04-09 DIAGNOSIS — R2689 Other abnormalities of gait and mobility: Secondary | ICD-10-CM | POA: Diagnosis not present

## 2017-04-09 DIAGNOSIS — N179 Acute kidney failure, unspecified: Secondary | ICD-10-CM | POA: Diagnosis not present

## 2017-04-09 DIAGNOSIS — M6281 Muscle weakness (generalized): Secondary | ICD-10-CM | POA: Diagnosis not present

## 2017-04-09 DIAGNOSIS — R278 Other lack of coordination: Secondary | ICD-10-CM | POA: Diagnosis not present

## 2017-04-11 DIAGNOSIS — N179 Acute kidney failure, unspecified: Secondary | ICD-10-CM | POA: Diagnosis not present

## 2017-04-11 DIAGNOSIS — R2689 Other abnormalities of gait and mobility: Secondary | ICD-10-CM | POA: Diagnosis not present

## 2017-04-11 DIAGNOSIS — M6281 Muscle weakness (generalized): Secondary | ICD-10-CM | POA: Diagnosis not present

## 2017-04-11 DIAGNOSIS — R278 Other lack of coordination: Secondary | ICD-10-CM | POA: Diagnosis not present

## 2017-04-12 DIAGNOSIS — R2689 Other abnormalities of gait and mobility: Secondary | ICD-10-CM | POA: Diagnosis not present

## 2017-04-12 DIAGNOSIS — N179 Acute kidney failure, unspecified: Secondary | ICD-10-CM | POA: Diagnosis not present

## 2017-04-12 DIAGNOSIS — R278 Other lack of coordination: Secondary | ICD-10-CM | POA: Diagnosis not present

## 2017-04-12 DIAGNOSIS — M6281 Muscle weakness (generalized): Secondary | ICD-10-CM | POA: Diagnosis not present

## 2017-04-13 DIAGNOSIS — M6281 Muscle weakness (generalized): Secondary | ICD-10-CM | POA: Diagnosis not present

## 2017-04-13 DIAGNOSIS — R278 Other lack of coordination: Secondary | ICD-10-CM | POA: Diagnosis not present

## 2017-04-13 DIAGNOSIS — N179 Acute kidney failure, unspecified: Secondary | ICD-10-CM | POA: Diagnosis not present

## 2017-04-13 DIAGNOSIS — R2689 Other abnormalities of gait and mobility: Secondary | ICD-10-CM | POA: Diagnosis not present

## 2017-04-14 DIAGNOSIS — N179 Acute kidney failure, unspecified: Secondary | ICD-10-CM | POA: Diagnosis not present

## 2017-04-14 DIAGNOSIS — R2689 Other abnormalities of gait and mobility: Secondary | ICD-10-CM | POA: Diagnosis not present

## 2017-04-14 DIAGNOSIS — M6281 Muscle weakness (generalized): Secondary | ICD-10-CM | POA: Diagnosis not present

## 2017-04-14 DIAGNOSIS — R278 Other lack of coordination: Secondary | ICD-10-CM | POA: Diagnosis not present

## 2017-04-15 DIAGNOSIS — N179 Acute kidney failure, unspecified: Secondary | ICD-10-CM | POA: Diagnosis not present

## 2017-04-15 DIAGNOSIS — M6281 Muscle weakness (generalized): Secondary | ICD-10-CM | POA: Diagnosis not present

## 2017-04-15 DIAGNOSIS — R2689 Other abnormalities of gait and mobility: Secondary | ICD-10-CM | POA: Diagnosis not present

## 2017-04-15 DIAGNOSIS — R278 Other lack of coordination: Secondary | ICD-10-CM | POA: Diagnosis not present

## 2017-04-18 DIAGNOSIS — M6281 Muscle weakness (generalized): Secondary | ICD-10-CM | POA: Diagnosis not present

## 2017-04-18 DIAGNOSIS — N179 Acute kidney failure, unspecified: Secondary | ICD-10-CM | POA: Diagnosis not present

## 2017-04-18 DIAGNOSIS — R278 Other lack of coordination: Secondary | ICD-10-CM | POA: Diagnosis not present

## 2017-04-18 DIAGNOSIS — R2689 Other abnormalities of gait and mobility: Secondary | ICD-10-CM | POA: Diagnosis not present

## 2017-04-19 DIAGNOSIS — M6281 Muscle weakness (generalized): Secondary | ICD-10-CM | POA: Diagnosis not present

## 2017-04-19 DIAGNOSIS — N179 Acute kidney failure, unspecified: Secondary | ICD-10-CM | POA: Diagnosis not present

## 2017-04-19 DIAGNOSIS — R2689 Other abnormalities of gait and mobility: Secondary | ICD-10-CM | POA: Diagnosis not present

## 2017-04-19 DIAGNOSIS — R278 Other lack of coordination: Secondary | ICD-10-CM | POA: Diagnosis not present

## 2017-04-20 DIAGNOSIS — R278 Other lack of coordination: Secondary | ICD-10-CM | POA: Diagnosis not present

## 2017-04-20 DIAGNOSIS — I2510 Atherosclerotic heart disease of native coronary artery without angina pectoris: Secondary | ICD-10-CM | POA: Diagnosis not present

## 2017-04-20 DIAGNOSIS — F0390 Unspecified dementia without behavioral disturbance: Secondary | ICD-10-CM | POA: Diagnosis not present

## 2017-04-20 DIAGNOSIS — N179 Acute kidney failure, unspecified: Secondary | ICD-10-CM | POA: Diagnosis not present

## 2017-04-20 DIAGNOSIS — M6281 Muscle weakness (generalized): Secondary | ICD-10-CM | POA: Diagnosis not present

## 2017-04-20 DIAGNOSIS — D638 Anemia in other chronic diseases classified elsewhere: Secondary | ICD-10-CM | POA: Diagnosis not present

## 2017-04-20 DIAGNOSIS — Z66 Do not resuscitate: Secondary | ICD-10-CM | POA: Diagnosis not present

## 2017-04-20 DIAGNOSIS — R2689 Other abnormalities of gait and mobility: Secondary | ICD-10-CM | POA: Diagnosis not present

## 2017-04-20 DIAGNOSIS — E119 Type 2 diabetes mellitus without complications: Secondary | ICD-10-CM | POA: Diagnosis not present

## 2017-04-21 DIAGNOSIS — M6281 Muscle weakness (generalized): Secondary | ICD-10-CM | POA: Diagnosis not present

## 2017-04-21 DIAGNOSIS — R2689 Other abnormalities of gait and mobility: Secondary | ICD-10-CM | POA: Diagnosis not present

## 2017-04-21 DIAGNOSIS — N179 Acute kidney failure, unspecified: Secondary | ICD-10-CM | POA: Diagnosis not present

## 2017-04-21 DIAGNOSIS — R278 Other lack of coordination: Secondary | ICD-10-CM | POA: Diagnosis not present

## 2017-04-22 DIAGNOSIS — N179 Acute kidney failure, unspecified: Secondary | ICD-10-CM | POA: Diagnosis not present

## 2017-04-22 DIAGNOSIS — R278 Other lack of coordination: Secondary | ICD-10-CM | POA: Diagnosis not present

## 2017-04-22 DIAGNOSIS — R2689 Other abnormalities of gait and mobility: Secondary | ICD-10-CM | POA: Diagnosis not present

## 2017-04-22 DIAGNOSIS — M6281 Muscle weakness (generalized): Secondary | ICD-10-CM | POA: Diagnosis not present

## 2017-04-25 DIAGNOSIS — R2689 Other abnormalities of gait and mobility: Secondary | ICD-10-CM | POA: Diagnosis not present

## 2017-04-25 DIAGNOSIS — R278 Other lack of coordination: Secondary | ICD-10-CM | POA: Diagnosis not present

## 2017-04-25 DIAGNOSIS — M6281 Muscle weakness (generalized): Secondary | ICD-10-CM | POA: Diagnosis not present

## 2017-04-25 DIAGNOSIS — N179 Acute kidney failure, unspecified: Secondary | ICD-10-CM | POA: Diagnosis not present

## 2017-04-26 DIAGNOSIS — R278 Other lack of coordination: Secondary | ICD-10-CM | POA: Diagnosis not present

## 2017-04-26 DIAGNOSIS — N179 Acute kidney failure, unspecified: Secondary | ICD-10-CM | POA: Diagnosis not present

## 2017-04-26 DIAGNOSIS — R2689 Other abnormalities of gait and mobility: Secondary | ICD-10-CM | POA: Diagnosis not present

## 2017-04-26 DIAGNOSIS — M6281 Muscle weakness (generalized): Secondary | ICD-10-CM | POA: Diagnosis not present

## 2017-04-27 DIAGNOSIS — R2689 Other abnormalities of gait and mobility: Secondary | ICD-10-CM | POA: Diagnosis not present

## 2017-04-27 DIAGNOSIS — R278 Other lack of coordination: Secondary | ICD-10-CM | POA: Diagnosis not present

## 2017-04-27 DIAGNOSIS — N179 Acute kidney failure, unspecified: Secondary | ICD-10-CM | POA: Diagnosis not present

## 2017-04-27 DIAGNOSIS — M6281 Muscle weakness (generalized): Secondary | ICD-10-CM | POA: Diagnosis not present

## 2017-04-30 DIAGNOSIS — N179 Acute kidney failure, unspecified: Secondary | ICD-10-CM | POA: Diagnosis not present

## 2017-04-30 DIAGNOSIS — R278 Other lack of coordination: Secondary | ICD-10-CM | POA: Diagnosis not present

## 2017-04-30 DIAGNOSIS — R2689 Other abnormalities of gait and mobility: Secondary | ICD-10-CM | POA: Diagnosis not present

## 2017-04-30 DIAGNOSIS — M6281 Muscle weakness (generalized): Secondary | ICD-10-CM | POA: Diagnosis not present

## 2017-05-02 DIAGNOSIS — R278 Other lack of coordination: Secondary | ICD-10-CM | POA: Diagnosis not present

## 2017-05-02 DIAGNOSIS — M6281 Muscle weakness (generalized): Secondary | ICD-10-CM | POA: Diagnosis not present

## 2017-05-02 DIAGNOSIS — R2689 Other abnormalities of gait and mobility: Secondary | ICD-10-CM | POA: Diagnosis not present

## 2017-05-02 DIAGNOSIS — N179 Acute kidney failure, unspecified: Secondary | ICD-10-CM | POA: Diagnosis not present

## 2017-05-03 DIAGNOSIS — R2689 Other abnormalities of gait and mobility: Secondary | ICD-10-CM | POA: Diagnosis not present

## 2017-05-03 DIAGNOSIS — M6281 Muscle weakness (generalized): Secondary | ICD-10-CM | POA: Diagnosis not present

## 2017-05-03 DIAGNOSIS — N179 Acute kidney failure, unspecified: Secondary | ICD-10-CM | POA: Diagnosis not present

## 2017-05-03 DIAGNOSIS — R278 Other lack of coordination: Secondary | ICD-10-CM | POA: Diagnosis not present

## 2017-05-04 DIAGNOSIS — R278 Other lack of coordination: Secondary | ICD-10-CM | POA: Diagnosis not present

## 2017-05-04 DIAGNOSIS — N179 Acute kidney failure, unspecified: Secondary | ICD-10-CM | POA: Diagnosis not present

## 2017-05-04 DIAGNOSIS — M6281 Muscle weakness (generalized): Secondary | ICD-10-CM | POA: Diagnosis not present

## 2017-05-04 DIAGNOSIS — R2689 Other abnormalities of gait and mobility: Secondary | ICD-10-CM | POA: Diagnosis not present

## 2017-05-05 DIAGNOSIS — M6281 Muscle weakness (generalized): Secondary | ICD-10-CM | POA: Diagnosis not present

## 2017-05-05 DIAGNOSIS — R278 Other lack of coordination: Secondary | ICD-10-CM | POA: Diagnosis not present

## 2017-05-05 DIAGNOSIS — R2689 Other abnormalities of gait and mobility: Secondary | ICD-10-CM | POA: Diagnosis not present

## 2017-05-05 DIAGNOSIS — N179 Acute kidney failure, unspecified: Secondary | ICD-10-CM | POA: Diagnosis not present

## 2017-05-13 ENCOUNTER — Inpatient Hospital Stay (HOSPITAL_COMMUNITY)
Admission: EM | Admit: 2017-05-13 | Discharge: 2017-05-18 | DRG: 637 | Disposition: A | Discharge status: Skilled Nursing Facility | Payer: Medicare Other | Attending: Internal Medicine | Admitting: Internal Medicine

## 2017-05-13 DIAGNOSIS — R110 Nausea: Secondary | ICD-10-CM | POA: Diagnosis not present

## 2017-05-13 DIAGNOSIS — E1010 Type 1 diabetes mellitus with ketoacidosis without coma: Principal | ICD-10-CM | POA: Diagnosis present

## 2017-05-13 DIAGNOSIS — E876 Hypokalemia: Secondary | ICD-10-CM | POA: Diagnosis present

## 2017-05-13 DIAGNOSIS — I4581 Long QT syndrome: Secondary | ICD-10-CM | POA: Diagnosis present

## 2017-05-13 DIAGNOSIS — Z8673 Personal history of transient ischemic attack (TIA), and cerebral infarction without residual deficits: Secondary | ICD-10-CM

## 2017-05-13 DIAGNOSIS — Z955 Presence of coronary angioplasty implant and graft: Secondary | ICD-10-CM

## 2017-05-13 DIAGNOSIS — Z66 Do not resuscitate: Secondary | ICD-10-CM | POA: Diagnosis present

## 2017-05-13 DIAGNOSIS — F419 Anxiety disorder, unspecified: Secondary | ICD-10-CM | POA: Diagnosis present

## 2017-05-13 DIAGNOSIS — I252 Old myocardial infarction: Secondary | ICD-10-CM

## 2017-05-13 DIAGNOSIS — K2970 Gastritis, unspecified, without bleeding: Secondary | ICD-10-CM | POA: Diagnosis not present

## 2017-05-13 DIAGNOSIS — E10319 Type 1 diabetes mellitus with unspecified diabetic retinopathy without macular edema: Secondary | ICD-10-CM | POA: Diagnosis not present

## 2017-05-13 DIAGNOSIS — R9431 Abnormal electrocardiogram [ECG] [EKG]: Secondary | ICD-10-CM | POA: Diagnosis not present

## 2017-05-13 DIAGNOSIS — F0390 Unspecified dementia without behavioral disturbance: Secondary | ICD-10-CM | POA: Diagnosis present

## 2017-05-13 DIAGNOSIS — R112 Nausea with vomiting, unspecified: Secondary | ICD-10-CM | POA: Diagnosis not present

## 2017-05-13 DIAGNOSIS — E1043 Type 1 diabetes mellitus with diabetic autonomic (poly)neuropathy: Secondary | ICD-10-CM | POA: Diagnosis present

## 2017-05-13 DIAGNOSIS — E119 Type 2 diabetes mellitus without complications: Secondary | ICD-10-CM | POA: Diagnosis not present

## 2017-05-13 DIAGNOSIS — I2510 Atherosclerotic heart disease of native coronary artery without angina pectoris: Secondary | ICD-10-CM | POA: Diagnosis present

## 2017-05-13 DIAGNOSIS — D72829 Elevated white blood cell count, unspecified: Secondary | ICD-10-CM | POA: Diagnosis present

## 2017-05-13 DIAGNOSIS — J189 Pneumonia, unspecified organism: Secondary | ICD-10-CM | POA: Diagnosis present

## 2017-05-13 DIAGNOSIS — Z87891 Personal history of nicotine dependence: Secondary | ICD-10-CM

## 2017-05-13 DIAGNOSIS — E039 Hypothyroidism, unspecified: Secondary | ICD-10-CM | POA: Diagnosis present

## 2017-05-13 DIAGNOSIS — R739 Hyperglycemia, unspecified: Secondary | ICD-10-CM | POA: Diagnosis not present

## 2017-05-13 DIAGNOSIS — G8929 Other chronic pain: Secondary | ICD-10-CM | POA: Diagnosis present

## 2017-05-13 MED ORDER — SODIUM CHLORIDE 0.9 % IV BOLUS (SEPSIS)
2000.0000 mL | Freq: Once | INTRAVENOUS | Status: AC
  Administered 2017-05-14: 2000 mL via INTRAVENOUS

## 2017-05-13 NOTE — ED Triage Notes (Signed)
Pt BIB [facility] from [facility]. Pt c/o N/V x2 days. Yesterday her CBG read high with staff. MD stated he wanted to try insulin and just monitor her. She hasnt been able to keep anything down. EMS reported a odor of ketones but no kaussmal resperations just hyperventilation. Pt hx of dementia. Sats were fine on RA. O2 was applied by EMS for comfort. 500cc of NS given in route and 2nd started just PTA. Zofran administered en route. 18 ga IV placed. Pt has calmed down with the fluid and seems more comfortable according to EMS.

## 2017-05-13 NOTE — ED Notes (Signed)
Bed: [id number] Expected date:  Expected time:  Means of arrival:  Comments: 76 yo F/Hyperglycemia

## 2017-05-14 ENCOUNTER — Other Ambulatory Visit: Payer: Self-pay

## 2017-05-14 ENCOUNTER — Encounter (HOSPITAL_COMMUNITY): Payer: Self-pay | Admitting: Emergency Medicine

## 2017-05-14 ENCOUNTER — Emergency Department (HOSPITAL_COMMUNITY): Payer: Medicare Other

## 2017-05-14 DIAGNOSIS — Z87891 Personal history of nicotine dependence: Secondary | ICD-10-CM | POA: Diagnosis not present

## 2017-05-14 DIAGNOSIS — R739 Hyperglycemia, unspecified: Secondary | ICD-10-CM | POA: Diagnosis not present

## 2017-05-14 DIAGNOSIS — F331 Major depressive disorder, recurrent, moderate: Secondary | ICD-10-CM | POA: Diagnosis not present

## 2017-05-14 DIAGNOSIS — E039 Hypothyroidism, unspecified: Secondary | ICD-10-CM | POA: Diagnosis present

## 2017-05-14 DIAGNOSIS — E1065 Type 1 diabetes mellitus with hyperglycemia: Secondary | ICD-10-CM | POA: Diagnosis not present

## 2017-05-14 DIAGNOSIS — J189 Pneumonia, unspecified organism: Secondary | ICD-10-CM | POA: Diagnosis present

## 2017-05-14 DIAGNOSIS — N179 Acute kidney failure, unspecified: Secondary | ICD-10-CM | POA: Diagnosis not present

## 2017-05-14 DIAGNOSIS — I2510 Atherosclerotic heart disease of native coronary artery without angina pectoris: Secondary | ICD-10-CM | POA: Diagnosis present

## 2017-05-14 DIAGNOSIS — E10319 Type 1 diabetes mellitus with unspecified diabetic retinopathy without macular edema: Secondary | ICD-10-CM | POA: Diagnosis present

## 2017-05-14 DIAGNOSIS — E785 Hyperlipidemia, unspecified: Secondary | ICD-10-CM | POA: Diagnosis not present

## 2017-05-14 DIAGNOSIS — E559 Vitamin D deficiency, unspecified: Secondary | ICD-10-CM | POA: Diagnosis not present

## 2017-05-14 DIAGNOSIS — I4581 Long QT syndrome: Secondary | ICD-10-CM | POA: Diagnosis present

## 2017-05-14 DIAGNOSIS — D72829 Elevated white blood cell count, unspecified: Secondary | ICD-10-CM

## 2017-05-14 DIAGNOSIS — R9431 Abnormal electrocardiogram [ECG] [EKG]: Secondary | ICD-10-CM | POA: Diagnosis not present

## 2017-05-14 DIAGNOSIS — R2689 Other abnormalities of gait and mobility: Secondary | ICD-10-CM | POA: Diagnosis not present

## 2017-05-14 DIAGNOSIS — E1010 Type 1 diabetes mellitus with ketoacidosis without coma: Secondary | ICD-10-CM | POA: Diagnosis not present

## 2017-05-14 DIAGNOSIS — E876 Hypokalemia: Secondary | ICD-10-CM

## 2017-05-14 DIAGNOSIS — R4182 Altered mental status, unspecified: Secondary | ICD-10-CM | POA: Diagnosis not present

## 2017-05-14 DIAGNOSIS — Z8673 Personal history of transient ischemic attack (TIA), and cerebral infarction without residual deficits: Secondary | ICD-10-CM | POA: Diagnosis not present

## 2017-05-14 DIAGNOSIS — F419 Anxiety disorder, unspecified: Secondary | ICD-10-CM | POA: Diagnosis present

## 2017-05-14 DIAGNOSIS — F411 Generalized anxiety disorder: Secondary | ICD-10-CM | POA: Diagnosis not present

## 2017-05-14 DIAGNOSIS — M6281 Muscle weakness (generalized): Secondary | ICD-10-CM | POA: Diagnosis not present

## 2017-05-14 DIAGNOSIS — F0390 Unspecified dementia without behavioral disturbance: Secondary | ICD-10-CM

## 2017-05-14 DIAGNOSIS — I252 Old myocardial infarction: Secondary | ICD-10-CM | POA: Diagnosis not present

## 2017-05-14 DIAGNOSIS — E1310 Other specified diabetes mellitus with ketoacidosis without coma: Secondary | ICD-10-CM | POA: Diagnosis not present

## 2017-05-14 DIAGNOSIS — L89152 Pressure ulcer of sacral region, stage 2: Secondary | ICD-10-CM | POA: Diagnosis not present

## 2017-05-14 DIAGNOSIS — E1365 Other specified diabetes mellitus with hyperglycemia: Secondary | ICD-10-CM | POA: Diagnosis not present

## 2017-05-14 DIAGNOSIS — K3184 Gastroparesis: Secondary | ICD-10-CM | POA: Diagnosis not present

## 2017-05-14 DIAGNOSIS — Z955 Presence of coronary angioplasty implant and graft: Secondary | ICD-10-CM | POA: Diagnosis not present

## 2017-05-14 DIAGNOSIS — R41841 Cognitive communication deficit: Secondary | ICD-10-CM | POA: Diagnosis not present

## 2017-05-14 DIAGNOSIS — G8929 Other chronic pain: Secondary | ICD-10-CM | POA: Diagnosis present

## 2017-05-14 DIAGNOSIS — R278 Other lack of coordination: Secondary | ICD-10-CM | POA: Diagnosis not present

## 2017-05-14 DIAGNOSIS — E1043 Type 1 diabetes mellitus with diabetic autonomic (poly)neuropathy: Secondary | ICD-10-CM | POA: Diagnosis present

## 2017-05-14 DIAGNOSIS — Z66 Do not resuscitate: Secondary | ICD-10-CM | POA: Diagnosis present

## 2017-05-14 DIAGNOSIS — R112 Nausea with vomiting, unspecified: Secondary | ICD-10-CM | POA: Diagnosis not present

## 2017-05-14 LAB — BASIC METABOLIC PANEL
ANION GAP: 14 (ref 5–15)
ANION GAP: 15 (ref 5–15)
ANION GAP: 22 — AB (ref 5–15)
Anion gap: 17 — ABNORMAL HIGH (ref 5–15)
Anion gap: 14 (ref 5–15)
BUN: 20 mg/dL (ref 6–20)
BUN: 14 mg/dL (ref 6–20)
BUN: 12 mg/dL (ref 6–20)
BUN: 10 mg/dL (ref 6–20)
BUN: 10 mg/dL (ref 6–20)
CALCIUM: 8.8 mg/dL — AB (ref 8.9–10.3)
CALCIUM: 8.9 mg/dL (ref 8.9–10.3)
CALCIUM: 9.3 mg/dL (ref 8.9–10.3)
CALCIUM: 9 mg/dL (ref 8.9–10.3)
CHLORIDE: 103 mmol/L (ref 101–111)
CHLORIDE: 106 mmol/L (ref 101–111)
CHLORIDE: 107 mmol/L (ref 101–111)
CHLORIDE: 108 mmol/L (ref 101–111)
CO2: 15 mmol/L — AB (ref 22–32)
CO2: 18 mmol/L — AB (ref 22–32)
CO2: 18 mmol/L — ABNORMAL LOW (ref 22–32)
CO2: 19 mmol/L — AB (ref 22–32)
CO2: 20 mmol/L — AB (ref 22–32)
CREATININE: 0.69 mg/dL (ref 0.44–1.00)
CREATININE: 0.69 mg/dL (ref 0.44–1.00)
CREATININE: 0.94 mg/dL (ref 0.44–1.00)
Calcium: 9.4 mg/dL (ref 8.9–10.3)
Chloride: 110 mmol/L (ref 101–111)
Creatinine, Ser: 0.74 mg/dL (ref 0.44–1.00)
Creatinine, Ser: 0.65 mg/dL (ref 0.44–1.00)
GFR calc Af Amer: >60 mL/min (ref >60)
GFR calc Af Amer: >60 mL/min (ref >60)
GFR calc Af Amer: >60 mL/min (ref >60)
GFR calc Af Amer: >60 mL/min (ref >60)
GFR calc non Af Amer: 58 mL/min — ABNORMAL LOW (ref >60)
GFR calc non Af Amer: >60 mL/min (ref >60)
GFR calc non Af Amer: >60 mL/min (ref >60)
GFR calc non Af Amer: >60 mL/min (ref >60)
GLUCOSE: 129 mg/dL — AB (ref 65–99)
GLUCOSE: 246 mg/dL — AB (ref 65–99)
GLUCOSE: 147 mg/dL — AB (ref 65–99)
GLUCOSE: 166 mg/dL — AB (ref 65–99)
Glucose, Bld: 388 mg/dL — ABNORMAL HIGH (ref 65–99)
POTASSIUM: 3 mmol/L — AB (ref 3.5–5.1)
Potassium: 2.9 mmol/L — ABNORMAL LOW (ref 3.5–5.1)
Potassium: 3.1 mmol/L — ABNORMAL LOW (ref 3.5–5.1)
Potassium: 3 mmol/L — ABNORMAL LOW (ref 3.5–5.1)
Potassium: 3.5 mmol/L (ref 3.5–5.1)
SODIUM: 143 mmol/L (ref 135–145)
Sodium: 140 mmol/L (ref 135–145)
Sodium: 141 mmol/L (ref 135–145)
Sodium: 141 mmol/L (ref 135–145)
Sodium: 141 mmol/L (ref 135–145)

## 2017-05-14 LAB — CBC WITH DIFFERENTIAL/PLATELET
BASOS ABS: 0 10*3/uL (ref 0.0–0.1)
Basophils Absolute: 0 10*3/uL (ref 0.0–0.1)
Basophils Relative: 0 %
Basophils Relative: 0 %
EOS ABS: 0 10*3/uL (ref 0.0–0.7)
EOS ABS: 0 10*3/uL (ref 0.0–0.7)
EOS PCT: 0 %
Eosinophils Relative: 0 %
HCT: 29.8 % — ABNORMAL LOW (ref 36.0–46.0)
HEMATOCRIT: 33.7 % — AB (ref 36.0–46.0)
HEMOGLOBIN: 11.6 g/dL — AB (ref 12.0–15.0)
Hemoglobin: 10.3 g/dL — ABNORMAL LOW (ref 12.0–15.0)
LYMPHS ABS: 0.7 10*3/uL (ref 0.7–4.0)
LYMPHS PCT: 6 %
Lymphocytes Relative: 5 %
Lymphs Abs: 0.6 10*3/uL — ABNORMAL LOW (ref 0.7–4.0)
MCH: 28.4 pg (ref 26.0–34.0)
MCH: 28.5 pg (ref 26.0–34.0)
MCHC: 34.4 g/dL (ref 30.0–36.0)
MCHC: 34.6 g/dL (ref 30.0–36.0)
MCV: 82.3 fL (ref 78.0–100.0)
MCV: 82.6 fL (ref 78.0–100.0)
MONO ABS: 0.5 10*3/uL (ref 0.1–1.0)
Monocytes Absolute: 0.3 10*3/uL (ref 0.1–1.0)
Monocytes Relative: 2 %
Monocytes Relative: 4 %
NEUTROS ABS: 12.3 10*3/uL — AB (ref 1.7–7.7)
NEUTROS PCT: 93 %
Neutro Abs: 11 10*3/uL — ABNORMAL HIGH (ref 1.7–7.7)
Neutrophils Relative %: 90 %
PLATELETS: 209 10*3/uL (ref 150–400)
Platelets: 254 10*3/uL (ref 150–400)
RBC: 3.62 MIL/uL — ABNORMAL LOW (ref 3.87–5.11)
RBC: 4.08 MIL/uL (ref 3.87–5.11)
RDW: 14.5 % (ref 11.5–15.5)
RDW: 14.6 % (ref 11.5–15.5)
WBC: 12.2 10*3/uL — ABNORMAL HIGH (ref 4.0–10.5)
WBC: 13.2 10*3/uL — AB (ref 4.0–10.5)

## 2017-05-14 LAB — GLUCOSE, CAPILLARY
GLUCOSE-CAPILLARY: 174 mg/dL — AB (ref 65–99)
GLUCOSE-CAPILLARY: 116 mg/dL — AB (ref 65–99)
GLUCOSE-CAPILLARY: 105 mg/dL — AB (ref 65–99)
GLUCOSE-CAPILLARY: 276 mg/dL — AB (ref 65–99)
GLUCOSE-CAPILLARY: 300 mg/dL — AB (ref 65–99)
GLUCOSE-CAPILLARY: 181 mg/dL — AB (ref 65–99)
GLUCOSE-CAPILLARY: 148 mg/dL — AB (ref 65–99)
GLUCOSE-CAPILLARY: 126 mg/dL — AB (ref 65–99)
GLUCOSE-CAPILLARY: 217 mg/dL — AB (ref 65–99)
GLUCOSE-CAPILLARY: 240 mg/dL — AB (ref 65–99)
Glucose-Capillary: 92 mg/dL (ref 65–99)
Glucose-Capillary: 176 mg/dL — ABNORMAL HIGH (ref 65–99)
Glucose-Capillary: 134 mg/dL — ABNORMAL HIGH (ref 65–99)
Glucose-Capillary: 141 mg/dL — ABNORMAL HIGH (ref 65–99)
Glucose-Capillary: 250 mg/dL — ABNORMAL HIGH (ref 65–99)

## 2017-05-14 LAB — I-STAT CHEM 8, ED
BUN: 19 mg/dL (ref 6–20)
CHLORIDE: 104 mmol/L (ref 101–111)
Calcium, Ion: 1.14 mmol/L — ABNORMAL LOW (ref 1.15–1.40)
Creatinine, Ser: 0.5 mg/dL (ref 0.44–1.00)
Glucose, Bld: 403 mg/dL — ABNORMAL HIGH (ref 65–99)
HEMATOCRIT: 37 % (ref 36.0–46.0)
Hemoglobin: 12.6 g/dL (ref 12.0–15.0)
POTASSIUM: 3 mmol/L — AB (ref 3.5–5.1)
SODIUM: 143 mmol/L (ref 135–145)
TCO2: 15 mmol/L — AB (ref 22–32)

## 2017-05-14 LAB — URINALYSIS, ROUTINE W REFLEX MICROSCOPIC
BILIRUBIN URINE: NEGATIVE
KETONES UR: 80 mg/dL — AB
LEUKOCYTES UA: NEGATIVE
NITRITE: NEGATIVE
PH: 5 (ref 5.0–8.0)
PROTEIN: NEGATIVE mg/dL
Specific Gravity, Urine: 1.009 (ref 1.005–1.030)
Squamous Epithelial / LPF: NONE SEEN

## 2017-05-14 LAB — MAGNESIUM: MAGNESIUM: 1.7 mg/dL (ref 1.7–2.4)

## 2017-05-14 LAB — HEMOGLOBIN A1C
Hgb A1c MFr Bld: 8.7 % — ABNORMAL HIGH (ref 4.8–5.6)
MEAN PLASMA GLUCOSE: 202.99 mg/dL

## 2017-05-14 LAB — CBG MONITORING, ED
GLUCOSE-CAPILLARY: 321 mg/dL — AB (ref 65–99)
GLUCOSE-CAPILLARY: 278 mg/dL — AB (ref 65–99)
Glucose-Capillary: 230 mg/dL — ABNORMAL HIGH (ref 65–99)
Glucose-Capillary: 367 mg/dL — ABNORMAL HIGH (ref 65–99)

## 2017-05-14 LAB — MRSA PCR SCREENING: MRSA BY PCR: NEGATIVE

## 2017-05-14 MED ORDER — DEXTROSE 5 % IV SOLN
1.0000 g | INTRAVENOUS | Status: DC
  Administered 2017-05-15 – 2017-05-18 (×4): 1 g via INTRAVENOUS
  Filled 2017-05-14 (×6): qty 10

## 2017-05-14 MED ORDER — DEXTROSE-NACL 5-0.45 % IV SOLN
INTRAVENOUS | Status: DC
  Administered 2017-05-14 – 2017-05-15 (×3): via INTRAVENOUS

## 2017-05-14 MED ORDER — ACETAMINOPHEN 325 MG PO TABS: 650.0000 mg | ORAL_TABLET | Freq: Four times a day (QID) | ORAL | Status: DC | PRN

## 2017-05-14 MED ORDER — IPRATROPIUM-ALBUTEROL 0.5-2.5 (3) MG/3ML IN SOLN: 3.0000 mL | Freq: Four times a day (QID) | RESPIRATORY_TRACT | Status: DC | PRN

## 2017-05-14 MED ORDER — DEXTROSE-NACL 5-0.45 % IV SOLN: INTRAVENOUS | Status: DC

## 2017-05-14 MED ORDER — DEXTROSE 5 % IV SOLN
2.0000 g | Freq: Once | INTRAVENOUS | Status: AC
  Administered 2017-05-14: 2 g via INTRAVENOUS
  Filled 2017-05-14: qty 2

## 2017-05-14 MED ORDER — INSULIN REGULAR HUMAN 100 UNIT/ML IJ SOLN
INTRAMUSCULAR | Status: DC
  Administered 2017-05-14: 5.1 [IU]/h via INTRAVENOUS
  Filled 2017-05-14: qty 1

## 2017-05-14 MED ORDER — POTASSIUM CHLORIDE 10 MEQ/100ML IV SOLN: 10.0000 meq | INTRAVENOUS | Status: DC

## 2017-05-14 MED ORDER — ACETAMINOPHEN 650 MG RE SUPP: 650.0000 mg | Freq: Four times a day (QID) | RECTAL | Status: DC | PRN

## 2017-05-14 MED ORDER — CHLORHEXIDINE GLUCONATE CLOTH 2 % EX PADS: 6.0000 | MEDICATED_PAD | Freq: Every day | CUTANEOUS | Status: DC

## 2017-05-14 MED ORDER — POTASSIUM CHLORIDE 10 MEQ/100ML IV SOLN
10.0000 meq | INTRAVENOUS | Status: AC
  Administered 2017-05-14 (×4): 10 meq via INTRAVENOUS
  Filled 2017-05-14 (×3): qty 100

## 2017-05-14 MED ORDER — SODIUM CHLORIDE 0.9 % IV SOLN: INTRAVENOUS | Status: DC

## 2017-05-14 MED ORDER — LORAZEPAM 2 MG/ML IJ SOLN
1.0000 mg | Freq: Once | INTRAMUSCULAR | Status: AC
  Administered 2017-05-14: 1 mg via INTRAVENOUS
  Filled 2017-05-14: qty 1

## 2017-05-14 MED ORDER — SODIUM CHLORIDE 0.9 % IV SOLN
INTRAVENOUS | Status: DC
  Administered 2017-05-14: 2.6 [IU]/h via INTRAVENOUS
  Filled 2017-05-14: qty 1

## 2017-05-14 MED ORDER — MUPIROCIN 2 % EX OINT
1.0000 | TOPICAL_OINTMENT | Freq: Two times a day (BID) | CUTANEOUS | Status: DC
Start: 2017-05-14 — End: 2017-05-14

## 2017-05-14 MED ORDER — LACTATED RINGERS IV SOLN
INTRAVENOUS | Status: DC
  Administered 2017-05-14: 02:00:00 via INTRAVENOUS

## 2017-05-14 MED ORDER — SODIUM CHLORIDE 0.9 % IV SOLN
INTRAVENOUS | Status: DC
  Administered 2017-05-14: 07:00:00 via INTRAVENOUS

## 2017-05-14 MED ORDER — DEXTROSE 50 % IV SOLN
INTRAVENOUS | Status: AC
  Administered 2017-05-14: 15 mL
  Filled 2017-05-14: qty 50

## 2017-05-14 MED ORDER — POTASSIUM CHLORIDE 10 MEQ/100ML IV SOLN
10.0000 meq | INTRAVENOUS | Status: AC
  Administered 2017-05-14 (×4): 10 meq via INTRAVENOUS
  Filled 2017-05-14: qty 100

## 2017-05-14 MED ORDER — ENOXAPARIN SODIUM 40 MG/0.4ML ~~LOC~~ SOLN
40.0000 mg | SUBCUTANEOUS | Status: DC
  Administered 2017-05-14 – 2017-05-18 (×5): 40 mg via SUBCUTANEOUS
  Filled 2017-05-14 (×5): qty 0.4

## 2017-05-14 MED ORDER — POTASSIUM CHLORIDE 10 MEQ/100ML IV SOLN
10.0000 meq | INTRAVENOUS | Status: AC
  Administered 2017-05-14: 10 meq via INTRAVENOUS
  Filled 2017-05-14: qty 100

## 2017-05-14 NOTE — ED Notes (Signed)
Periwick placed to try and obtain a urine sample 

## 2017-05-14 NOTE — ED Provider Notes (Signed)
[facility]-ICU/STEPDOWN Provider Note   CSN: [id number] Arrival date & time: 05/13/17  2350     History   Chief Complaint Chief Complaint  Patient presents with  . Hyperglycemia    HPI Beth Payne is a 76 y.o. female.  76 yo F with a cc of blood sugar.  This is reported by the nursing home.  Going on for the past 5 days or so.  The patient is demented and gives limited history.  She reported yes to every question I asked her.  Level 5 caveat dementia.   The history is provided by the patient.  Illness  This is a new problem. The current episode started 2 days ago. The problem occurs constantly. The problem has not changed since onset.Pertinent negatives include no chest pain, no headaches and no shortness of breath. Nothing aggravates the symptoms. Nothing relieves the symptoms. She has tried nothing for the symptoms. The treatment provided no relief.    Past Medical History:  Diagnosis Date  . Anxiety   . Arthritis    "fingers" (12/23/2014)  . Chronic lower back pain   . Coronary artery disease   . Dementia   . Depression   . Fibromyalgia   . Fracture, ankle 12/23/2014  . GERD (gastroesophageal reflux disease) dx'd 1995  . Heart murmur   . NSTEMI (non-ST elevated myocardial infarction) (HCC) 12/13/2014  . Osteopenia dx'd 2007  . Retinopathy   . TIA (transient ischemic attack)   . Type I diabetes mellitus (HCC) dx'd 1953   "I'm on a pup" (12/23/2014)    Patient Active Problem List   Diagnosis Date Noted  . DKA (diabetic ketoacidoses) (HCC) 02/02/2016  . DKA, type 1 (HCC) 02/02/2016  . Chronic pain syndrome   . Depression   . Anxiety state   . Other specified hypothyroidism   . Diabetic gastroparesis (HCC) 01/19/2016  . Transaminasemia   . Transaminitis 12/13/2015  . Hypomagnesemia 11/24/2015  . Protein-calorie malnutrition, severe 11/22/2015  . Pressure ulcer, stage I, non-blanching erythema 11/22/2015  . 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  Dementia 11/20/2015  . Abdominal pain 11/20/2015  . Weakness generalized 05/20/2015  . Lung mass 05/20/2015  . Hypokalemia 04/24/2015  . Acute encephalopathy 01/01/2015  . Type 1 diabetes mellitus with peripheral circulatory complications (HCC) 12/17/2014  . CAD in native artery   . Pulmonary hypertension (HCC)   . GERD (gastroesophageal reflux disease) 12/12/2014  . CAD s/p stent to mid-RCA in 2001. 12/12/2014  . Osteoarthritis 12/12/2014  . Systolic murmur 12/12/2014  . Normocytic anemia 12/12/2014  . Dyslipidemia 12/12/2014    Past Surgical History:  Procedure Laterality Date  . ABDOMINAL HYSTERECTOMY  1972  . ANTERIOR CERVICAL DECOMP/DISCECTOMY FUSION  05/2003   "C5-6"  . APPENDECTOMY    . BACK SURGERY    . BILATERAL CARPAL TUNNEL RELEASE Bilateral 2003-2004  . BUNIONECTOMY WITH HAMMERTOE RECONSTRUCTION Right 02/1999  . CATARACT EXTRACTION W/ INTRAOCULAR LENS  IMPLANT, BILATERAL Bilateral 1998  . CERVICAL DISC SURGERY  2004   "collapsed C-5"  . CESAREAN SECTION    . CORONARY ANGIOPLASTY WITH STENT PLACEMENT  07/1999   "?1"  . DILATION AND CURETTAGE OF UTERUS    . EXPLORATORY LAPAROTOMY  1975-1987 X 1   "adhesions"  . EYE SURGERY Bilateral since 1977   "numerous slaser surgeries for retinopathy"  . FRACTURE SURGERY    . LAPAROSCOPIC LYSIS OF ADHESIONS  [phone]  . LUMBAR MICRODISCECTOMY  03/2005  . OOPHORECTOMY Bilateral  1980's  . ORIF ANKLE FRACTURE Left 12/25/2014   Procedure: OPEN REDUCTION INTERNAL FIXATION (ORIF) LEFT TRIMALLEOLAR ANKLE FRACTURE;  Surgeon: Tarry Kos, MD;  Location: [facility] OR;  Service: Orthopedics;  Laterality: Left;  . PATELLA FRACTURE SURGERY Left 04/2007  . TONSILLECTOMY    . TRIGGER FINGER RELEASE Bilateral 2003  . VERTEBROPLASTY  01/2008   "compressed lumbar fracture"  . VITRECTOMY Right 08/1997    OB History    No data available       Home Medications    Prior to Admission medications   Medication Sig Start Date End Date  Taking? Authorizing Provider  amLODipine (NORVASC) 10 MG tablet Take 1 tablet (10 mg total) by mouth daily. 05/22/15  Yes Richarda Overlie, MD  ARIPiprazole (ABILIFY) 5 MG tablet Take 1 tablet (5 mg total) by mouth every morning. Patient taking differently: Take 5 mg by mouth daily.  04/28/15  Yes Ghimire, Werner Lean, MD  bisacodyl (DULCOLAX) 5 MG EC tablet Take 10 mg by mouth daily as needed for mild constipation or moderate constipation.   Yes [provider]  cholecalciferol (VITAMIN D) 1000 units tablet Take 2,000 Units by mouth daily.   Yes [provider]  donepezil (ARICEPT) 10 MG tablet Take 10 mg by mouth at bedtime.   Yes [provider]  ferrous sulfate 325 (65 FE) MG tablet Take 325 mg by mouth 2 (two) times daily.    Yes [provider]  glucagon (GLUCAGON EMERGENCY) 1 MG injection Inject 1 mg into the vein once as needed (low blood sugar).   Yes [provider]  insulin aspart (NOVOLOG) 100 UNIT/ML injection Inject at bedtime based upon sugars.  Sugar 0-120--no units; 121-150--1 unit;  151-200--2 units;  201-250--3 units; 251-300--5 units;  301-350--7 units;  351-400--9 units Patient taking differently: Inject 3 Units into the skin 3 (three) times daily with meals. Per sliding scale: BGL 201-250 = 2 units; 251-300 = 3 units; 301-350 = 4 units; >350 = 5 units; CALL THE DOCTOR FOR BS > 400 OR < 60 12/15/15  Yes Tat, David, MD  insulin detemir (LEVEMIR) 100 UNIT/ML injection Inject 0.1 mLs (10 Units total) into the skin 2 (two) times daily. Patient taking differently: Inject 8 Units into the skin 2 (two) times daily.  02/03/16  Yes Leroy Sea, MD  insulin lispro (HUMALOG) 100 UNIT/ML injection Inject 3 Units into the skin 3 (three) times daily.   Yes [provider]  lamoTRIgine (LAMICTAL) 25 MG tablet Take 100 mg by mouth at bedtime.    Yes [provider]  levothyroxine (SYNTHROID, LEVOTHROID) 50 MCG tablet Take 50 mcg by  mouth daily before breakfast.   Yes [provider]  LORazepam (ATIVAN) 0.5 MG tablet Take 1 tablet (0.5 mg total) by mouth at bedtime. Anxiety Patient taking differently: Take 0.25 mg by mouth at bedtime. Anxiety 12/15/15  Yes Tat, Onalee Hua, MD  magnesium hydroxide (MILK OF MAGNESIA) 400 MG/5ML suspension Take 30 mLs by mouth daily as needed for mild constipation.   Yes [provider]  metoCLOPramide (REGLAN) 5 MG tablet Take 1 tablet (5 mg total) by mouth 3 (three) times daily before meals. Patient taking differently: Take 5 mg by mouth 3 (three) times daily before meals. For Gastroparesis 11/24/15  Yes Rama, Maryruth Bun, MD  ondansetron (ZOFRAN) 4 MG tablet Take 4 mg by mouth every 6 (six) hours as needed for nausea or vomiting.   Yes [provider]  pantoprazole (PROTONIX) 40  MG tablet Take 1 tablet (40 mg total) by mouth 2 (two) times daily. 04/28/15  Yes Ghimire, Werner LeanShanker M, MD  polyethylene glycol (MIRALAX / GLYCOLAX) packet Take 17 g by mouth daily. Patient taking differently: Take 17 g by mouth daily. Mix in 8 oz water and drink 11/24/15  Yes Rama, Maryruth Bunhristina P, MD  prazosin (MINIPRESS) 1 MG capsule Take 1 mg by mouth at bedtime.    Yes [provider]  rosuvastatin (CRESTOR) 5 MG tablet Take 5 mg by mouth daily at 6 PM.    Yes [provider]  sertraline (ZOLOFT) 50 MG tablet Take 50 mg by mouth 2 (two) times daily.    Yes [provider]  traMADol (ULTRAM) 50 MG tablet Take 1 tablet (50 mg total) by mouth every 6 (six) hours as needed for moderate pain. 12/15/15  Yes Tat, Onalee Huaavid, MD  Amino Acids-Protein Hydrolys (FEEDING SUPPLEMENT, PRO-STAT SUGAR FREE 64,) LIQD Take 30 mLs by mouth 2 (two) times daily with a meal. Patient not taking: Reported on 05/14/2017 11/24/15   Rama, Maryruth Bunhristina P, MD  feeding supplement, ENSURE ENLIVE, (ENSURE ENLIVE) LIQD Take 237 mLs by mouth 3 (three) times daily with meals. Patient not taking: Reported on 01/19/2016  11/24/15   Rama, Maryruth Bunhristina P, MD    Family History Family History  Problem Relation Age of Onset  . Liver disease Sister   . Colon cancer Brother     Social History Social History   Tobacco Use  . Smoking status: Former Smoker    Packs/day: 1.00    Years: 27.00    Pack years: 27.00    Types: Cigarettes  . Smokeless tobacco: Never Used  . Tobacco comment: "quit smoking cigarettes in 1985  Substance Use Topics  . Alcohol use: No  . Drug use: No     Allergies   Diclofenac sodium; Doxycycline calcium; Calcium-containing compounds; Codeine; and Zocor [simvastatin]   Review of Systems Review of Systems  Constitutional: Negative for chills and fever.  HENT: Negative for congestion and rhinorrhea.   Eyes: Negative for redness and visual disturbance.  Respiratory: Negative for shortness of breath and wheezing.   Cardiovascular: Negative for chest pain and palpitations.  Gastrointestinal: Negative for nausea and vomiting.  Genitourinary: Negative for dysuria and urgency.  Musculoskeletal: Negative for arthralgias and myalgias.  Skin: Negative for pallor and wound.  Neurological: Negative for dizziness and headaches.     Physical Exam Updated Vital Signs BP (!) 171/61   Pulse 92   Temp 97.8 F (36.6 C) (Axillary)   Resp (!) 21   SpO2 99%   Physical Exam  Constitutional: She appears well-developed and well-nourished. No distress.  HENT:  Head: Normocephalic and atraumatic.  Eyes: EOM are normal. Pupils are equal, round, and reactive to light.  Neck: Normal range of motion. Neck supple.  Cardiovascular: Normal rate and regular rhythm. Exam reveals no gallop and no friction rub.  No murmur heard. Pulmonary/Chest: Effort normal. She has no wheezes. She has no rales.  Abdominal: Soft. She exhibits no distension and no mass. There is no tenderness. There is no guarding.  Musculoskeletal: She exhibits no edema or tenderness.  Neurological: She is alert.  Skin: Skin is  warm and dry. She is not diaphoretic.  Nursing note and vitals reviewed.    ED Treatments / Results  Labs (all labs ordered are listed, but only abnormal results are displayed) Labs Reviewed  BLOOD GAS, VENOUS - Abnormal; Notable for the following components:  Result Value   pCO2, Ven 26.4 (*)    Bicarbonate 15.3 (*)    Acid-base deficit 8.1 (*)    All other components within normal limits  CBC WITH DIFFERENTIAL/PLATELET - Abnormal; Notable for the following components:   WBC 13.2 (*)    Hemoglobin 11.6 (*)    HCT 33.7 (*)    Neutro Abs 12.3 (*)    Lymphs Abs 0.6 (*)    All other components within normal limits  BASIC METABOLIC PANEL - Abnormal; Notable for the following components:   Potassium 3.0 (*)    CO2 15 (*)    Glucose, Bld 388 (*)    GFR calc non Af Amer 58 (*)    Anion gap 22 (*)    All other components within normal limits  URINALYSIS, ROUTINE W REFLEX MICROSCOPIC - Abnormal; Notable for the following components:   Color, Urine COLORLESS (*)    Glucose, UA >=500 (*)    Hgb urine dipstick SMALL (*)    Ketones, ur 80 (*)    Bacteria, UA RARE (*)    All other components within normal limits  I-STAT CHEM 8, ED - Abnormal; Notable for the following components:   Potassium 3.0 (*)    Glucose, Bld 403 (*)    Calcium, Ion 1.14 (*)    TCO2 15 (*)    All other components within normal limits  CBG MONITORING, ED - Abnormal; Notable for the following components:   Glucose-Capillary 367 (*)    All other components within normal limits  CBG MONITORING, ED - Abnormal; Notable for the following components:   Glucose-Capillary 321 (*)    All other components within normal limits  CBG MONITORING, ED - Abnormal; Notable for the following components:   Glucose-Capillary 278 (*)    All other components within normal limits  CBG MONITORING, ED - Abnormal; Notable for the following components:   Glucose-Capillary 230 (*)    All other components within normal limits    BASIC METABOLIC PANEL  BASIC METABOLIC PANEL  MAGNESIUM  HEMOGLOBIN A1C  CBC WITH DIFFERENTIAL/PLATELET    EKG  EKG Interpretation  Date/Time:  Saturday May 14 2017 00:37:15 EST Ventricular Rate:  104 PR Interval:    QRS Duration: 142 QT Interval:  376 QTC Calculation: 495 R Axis:   98 Text Interpretation:  Sinus tachycardia Consider right atrial enlargement RBBB and LPFB TECHNICALLY DIFFICULT background noise No significant change since last tracing Confirmed by Melene Plan [phone]) on 05/14/2017 12:49:49 AM       Radiology Dg Chest 2 View  Result Date: 05/14/2017 CLINICAL DATA:  Nausea and vomiting. EXAM: CHEST  2 VIEW COMPARISON:  February 02, 2016 FINDINGS: The heart, hila, and mediastinum are normal. No pneumothorax. Patchy opacity in the left base on the frontal view. No other interval change or acute abnormality. Previous vertebroplasty. IMPRESSION: Suggested mild opacity in the left base. Electronically Signed   By: Gerome Sam III M.D   On: 05/14/2017 03:59    Procedures Procedures (including critical care time)  Medications Ordered in ED Medications  0.9 %  sodium chloride infusion ( Intravenous New Bag/Given 05/14/17 0634)  dextrose 5 %-0.45 % sodium chloride infusion (not administered)  insulin regular (NOVOLIN R,HUMULIN R) 100 Units in sodium chloride 0.9 % 100 mL (1 Units/mL) infusion (5.1 Units/hr Intravenous Transfusing/Transfer 05/14/17 0453)  enoxaparin (LOVENOX) injection 40 mg (not administered)  acetaminophen (TYLENOL) tablet 650 mg (not administered)    Or  acetaminophen (TYLENOL) suppository 650 mg (  not administered)  ipratropium-albuterol (DUONEB) 0.5-2.5 (3) MG/3ML nebulizer solution 3 mL (not administered)  potassium chloride 10 mEq in 100 mL IVPB (10 mEq Intravenous New Bag/Given 05/14/17 0634)  LORazepam (ATIVAN) injection 1 mg (not administered)  sodium chloride 0.9 % bolus 2,000 mL (0 mLs Intravenous Stopped 05/14/17 0209)  potassium  chloride 10 mEq in 100 mL IVPB (0 mEq Intravenous Stopped 05/14/17 0555)     Initial Impression / Assessment and Plan / ED Course  I have reviewed the triage vital signs and the nursing notes.  Pertinent labs & imaging results that were available during my care of the patient were reviewed by me and considered in my medical decision making (see chart for details).     76 yo F with a chief complaint of hyperglycemia.  Patient has a type I diabetic and has had multiple presentations in DKA.  The patient was found to have a blood sugar in the 400s with a bicarb of 15 and a anion gap of 22.  She was noted to be hypokalemic and so I started potassium prior to starting the insulin drip.  Will admit.  CRITICAL CARE Performed by: Rae Roam   Total critical care time: 35 minutes  Critical care time was exclusive of separately billable procedures and treating other patients.  Critical care was necessary to treat or prevent imminent or life-threatening deterioration.  Critical care was time spent personally by me on the following activities: development of treatment plan with patient and/or surrogate as well as nursing, discussions with consultants, evaluation of patient's response to treatment, examination of patient, obtaining history from patient or surrogate, ordering and performing treatments and interventions, ordering and review of laboratory studies, ordering and review of radiographic studies, pulse oximetry and re-evaluation of patient's condition.  The patients results and plan were reviewed and discussed.   Any x-rays performed were independently reviewed by myself.   Differential diagnosis were considered with the presenting HPI.  Medications  0.9 %  sodium chloride infusion ( Intravenous New Bag/Given 05/14/17 0634)  dextrose 5 %-0.45 % sodium chloride infusion (not administered)  insulin regular (NOVOLIN R,HUMULIN R) 100 Units in sodium chloride 0.9 % 100 mL (1 Units/mL)  infusion (5.1 Units/hr Intravenous Transfusing/Transfer 05/14/17 0453)  enoxaparin (LOVENOX) injection 40 mg (not administered)  acetaminophen (TYLENOL) tablet 650 mg (not administered)    Or  acetaminophen (TYLENOL) suppository 650 mg (not administered)  ipratropium-albuterol (DUONEB) 0.5-2.5 (3) MG/3ML nebulizer solution 3 mL (not administered)  potassium chloride 10 mEq in 100 mL IVPB (10 mEq Intravenous New Bag/Given 05/14/17 0634)  LORazepam (ATIVAN) injection 1 mg (not administered)  sodium chloride 0.9 % bolus 2,000 mL (0 mLs Intravenous Stopped 05/14/17 0209)  potassium chloride 10 mEq in 100 mL IVPB (0 mEq Intravenous Stopped 05/14/17 0555)    Vitals:   05/14/17 0400 05/14/17 0430 05/14/17 0500 05/14/17 0600  BP: (!) 152/67 (!) 143/76 (!) 154/68 (!) 171/61  Pulse: (!) 109 (!) 105 98 92  Resp: (!) 24 (!) 26  (!) 21  Temp:      TempSrc:      SpO2: 98% 99% 98% 99%    Final diagnoses:  Diabetic ketoacidosis without coma associated with type 1 diabetes mellitus (HCC)    Admission/ observation were discussed with the admitting physician, patient and/or family and they are comfortable with the plan.   Final Clinical Impressions(s) / ED Diagnoses   Final diagnoses:  Diabetic ketoacidosis without coma associated with type 1 diabetes mellitus (HCC)  ED Discharge Orders    None       Melene PlanFloyd, Shaynah Hund, [place]DO 05/14/17 [phone]

## 2017-05-14 NOTE — Progress Notes (Signed)
PHARMACY NOTE -  ANTIBIOTIC DOSE ADJUSTMENT   Request received for Pharmacy to assist with antibiotic dose adjustment for ceftriaxone  SCr 0.5, estimated CrCl 68 ml/min/1.7073m2 (normalized) Ceftriaxone 1g IV q24h, need for further dosage adjustment appears unlikely at present. Will sign off at this time.  Please reconsult if a change in clinical status warrants re-evaluation of dosage.  Thank you, Loralee PacasErin Garrus Gauthreaux, PharmD, BCPS Pager: [phone] 05/14/2017 7:35 AM

## 2017-05-14 NOTE — H&P (Addendum)
History and Physical    Beth Payne:[id number] DOB: [birth year] DOA: 05/13/2017  Referring MD/NP/PA: Dr. Melene Plan PCP: [name], MD  Patient coming from: [facility] via EMS  Chief Complaint: Hyperglycemia  I have personally briefly reviewed patient's old medical records in [facility]   HPI: Beth Payne is a 76 y.o. female with medical history significant of brittle DM type I, dementia, TIA, MI, CAD, hypothyroidism, fibromyalgia, chronic pain, and GERD; who presented from nursing facility for complaints of elevated blood sugars.  History is obtained from review of reports as patient has dementia and is unable to provide a reliable history.  Previously, admitted into the hospital at least 5 times for DKA in the year 2017, but this is her first admission this year.  Patient apparently had had nausea and vomiting for the last 2 days and yesterday was noted to have elevated blood sugars for which MD initially wanted to try insulin and monitor.  However, patient was unable to keep any foods or liquids down and reported to have ketone odor to breath and was hyperventilation. En route with EMS patient appears to be to 500 cc boluses of normal saline along with Zofran PTA.  ED Course: Upon admission into the emergency department patient was noted to be afebrile, pulse 95-107, respirations 16-33, blood pressure 152/82, and O2 saturations maintained on room air.  Labs revealed WBC 13.2 , hemoglobin 11.6, potassium 3, BUN 20, creatinine 0.94, glucose 388, and anion gap 22.  Urinalysis showed no signs of infection, but was positive for glucose and ketones.  Patient was given 2 L of normal saline IV fluids, 4 runs of 10 mEq of potassium chloride ordered, and glucose stabilizer to be started.  TRH called to admit  Review of Systems  Unable to perform ROS: Dementia    Past Medical History:  Diagnosis Date  . Anxiety   . Arthritis    "fingers" (12/23/2014)  . Chronic lower back  pain   . Coronary artery disease   . Dementia   . Depression   . Fibromyalgia   . Fracture, ankle 12/23/2014  . GERD (gastroesophageal reflux disease) dx'd 1995  . Heart murmur   . NSTEMI (non-ST elevated myocardial infarction) (HCC) 12/13/2014  . Osteopenia dx'd 2007  . Retinopathy   . TIA (transient ischemic attack)   . Type I diabetes mellitus (HCC) dx'd 1953   "I'm on a pup" (12/23/2014)    Past Surgical History:  Procedure Laterality Date  . ABDOMINAL HYSTERECTOMY  1972  . ANTERIOR CERVICAL DECOMP/DISCECTOMY FUSION  05/2003   "C5-6"  . APPENDECTOMY    . BACK SURGERY    . BILATERAL CARPAL TUNNEL RELEASE Bilateral 2003-2004  . BUNIONECTOMY WITH HAMMERTOE RECONSTRUCTION Right 02/1999  . CATARACT EXTRACTION W/ INTRAOCULAR LENS  IMPLANT, BILATERAL Bilateral 1998  . CERVICAL DISC SURGERY  2004   "collapsed C-5"  . CESAREAN SECTION    . CORONARY ANGIOPLASTY WITH STENT PLACEMENT  07/1999   "?1"  . DILATION AND CURETTAGE OF UTERUS    . EXPLORATORY LAPAROTOMY  1975-1987 X 1   "adhesions"  . EYE SURGERY Bilateral since 1977   "numerous slaser surgeries for retinopathy"  . FRACTURE SURGERY    . LAPAROSCOPIC LYSIS OF ADHESIONS  [phone] X 5  . LUMBAR MICRODISCECTOMY  03/2005  . OOPHORECTOMY Bilateral 1980's  . ORIF ANKLE FRACTURE Left 12/25/2014   Procedure: OPEN REDUCTION INTERNAL FIXATION (ORIF) LEFT TRIMALLEOLAR ANKLE FRACTURE;  Surgeon: Tarry Kos, MD;  Location: [facility] OR;  Service: Orthopedics;  Laterality: Left;  . PATELLA FRACTURE SURGERY Left 04/2007  . TONSILLECTOMY    . TRIGGER FINGER RELEASE Bilateral 2003  . VERTEBROPLASTY  01/2008   "compressed lumbar fracture"  . VITRECTOMY Right 08/1997     reports that she has quit smoking. Her smoking use included cigarettes. She has a 27.00 pack-year smoking history. she has never used smokeless tobacco. She reports that she does not drink alcohol or use drugs.  Allergies  Allergen Reactions  . Diclofenac Sodium Swelling and  Other (See Comments)    Blurred vision  . Doxycycline Calcium Swelling and Other (See Comments)    Blurred vision  . Calcium-Containing Compounds Other (See Comments)    Calcium listed as an allergy on [facility] 08/10/15  . Codeine Nausea And Vomiting and Other (See Comments)    Dizziness   . Zocor [Simvastatin] Other (See Comments)    Dizziness, weakness     Family History  Problem Relation Age of Onset  . Liver disease Sister   . Colon cancer Brother     Prior to Admission medications   Medication Sig Start Date End Date Taking? Authorizing Provider  amLODipine (NORVASC) 10 MG tablet Take 1 tablet (10 mg total) by mouth daily. 05/22/15  Yes Richarda Overlie, MD  ARIPiprazole (ABILIFY) 5 MG tablet Take 1 tablet (5 mg total) by mouth every morning. Patient taking differently: Take 5 mg by mouth daily.  04/28/15  Yes Ghimire, Werner Lean, MD  bisacodyl (DULCOLAX) 5 MG EC tablet Take 10 mg by mouth daily as needed for mild constipation or moderate constipation.   Yes [provider]  cholecalciferol (VITAMIN D) 1000 units tablet Take 2,000 Units by mouth daily.   Yes [provider]  donepezil (ARICEPT) 10 MG tablet Take 10 mg by mouth at bedtime.   Yes [provider]  ferrous sulfate 325 (65 FE) MG tablet Take 325 mg by mouth 2 (two) times daily.    Yes [provider]  glucagon (GLUCAGON EMERGENCY) 1 MG injection Inject 1 mg into the vein once as needed (low blood sugar).   Yes [provider]  insulin aspart (NOVOLOG) 100 UNIT/ML injection Inject at bedtime based upon sugars.  Sugar 0-120--no units; 121-150--1 unit;  151-200--2 units;  201-250--3 units; 251-300--5 units;  301-350--7 units;  351-400--9 units Patient taking differently: Inject 3 Units into the skin 3 (three) times daily with meals. Per sliding scale: BGL 201-250 = 2 units; 251-300 = 3 units; 301-350 = 4 units; >350 = 5 units; CALL THE DOCTOR FOR BS > 400 OR < 60 12/15/15  Yes Tat, David, MD   insulin detemir (LEVEMIR) 100 UNIT/ML injection Inject 0.1 mLs (10 Units total) into the skin 2 (two) times daily. Patient taking differently: Inject 8 Units into the skin 2 (two) times daily.  02/03/16  Yes Leroy Sea, MD  insulin lispro (HUMALOG) 100 UNIT/ML injection Inject 3 Units into the skin 3 (three) times daily.   Yes [provider]  lamoTRIgine (LAMICTAL) 25 MG tablet Take 100 mg by mouth at bedtime.    Yes [provider]  levothyroxine (SYNTHROID, LEVOTHROID) 50 MCG tablet Take 50 mcg by mouth daily before breakfast.   Yes [provider]  LORazepam (ATIVAN) 0.5 MG tablet Take 1 tablet (0.5 mg total) by mouth at bedtime. Anxiety Patient taking differently: Take 0.25 mg by mouth at bedtime. Anxiety 12/15/15  Yes Tat, Onalee Hua, MD  magnesium hydroxide (MILK OF  MAGNESIA) 400 MG/5ML suspension Take 30 mLs by mouth daily as needed for mild constipation.   Yes [provider]  metoCLOPramide (REGLAN) 5 MG tablet Take 1 tablet (5 mg total) by mouth 3 (three) times daily before meals. Patient taking differently: Take 5 mg by mouth 3 (three) times daily before meals. For Gastroparesis 11/24/15  Yes Rama, Maryruth Bunhristina P, MD  ondansetron (ZOFRAN) 4 MG tablet Take 4 mg by mouth every 6 (six) hours as needed for nausea or vomiting.   Yes [provider]  pantoprazole (PROTONIX) 40 MG tablet Take 1 tablet (40 mg total) by mouth 2 (two) times daily. 04/28/15  Yes Ghimire, Werner LeanShanker M, MD  polyethylene glycol (MIRALAX / GLYCOLAX) packet Take 17 g by mouth daily. Patient taking differently: Take 17 g by mouth daily. Mix in 8 oz water and drink 11/24/15  Yes Rama, Maryruth Bunhristina P, MD  prazosin (MINIPRESS) 1 MG capsule Take 1 mg by mouth at bedtime.    Yes [provider]  rosuvastatin (CRESTOR) 5 MG tablet Take 5 mg by mouth daily at 6 PM.    Yes [provider]  sertraline (ZOLOFT) 50 MG tablet Take 50 mg by mouth 2 (two) times daily.    Yes  [provider]  traMADol (ULTRAM) 50 MG tablet Take 1 tablet (50 mg total) by mouth every 6 (six) hours as needed for moderate pain. 12/15/15  Yes Tat, Onalee Huaavid, MD  Amino Acids-Protein Hydrolys (FEEDING SUPPLEMENT, PRO-STAT SUGAR FREE 64,) LIQD Take 30 mLs by mouth 2 (two) times daily with a meal. Patient not taking: Reported on 05/14/2017 11/24/15   Rama, Maryruth Bunhristina P, MD  feeding supplement, ENSURE ENLIVE, (ENSURE ENLIVE) LIQD Take 237 mLs by mouth 3 (three) times daily with meals. Patient not taking: Reported on 01/19/2016 11/24/15   Rama, Maryruth Bunhristina P, MD    Physical Exam:  Constitutional: Frail elderly female who appears restless Vitals:   05/14/17 0048 05/14/17 0143 05/14/17 0230 05/14/17 0330  BP: (!) 158/81 (!) 152/82 (!) 156/86 (!) 158/67  Pulse: (!) 103 99 (!) 107 95  Resp: 16 (!) 24 (!) 33 (!) 22  Temp: 97.8 F (36.6 C)     TempSrc: Axillary     SpO2: 99% 97% 100% 97%   Eyes: PERRL, lids and conjunctivae normal ENMT: Mucous membranes are dry. Posterior pharynx clear of any exudate or lesions. Neck: normal, supple, no masses, no thyromegaly Respiratory: clear to auscultation bilaterally, no wheezing, no crackles. Normal respiratory effort. No accessory muscle use.  Cardiovascular: Regular rate and rhythm, no murmurs / rubs / gallops. No extremity edema. 2+ pedal pulses. No carotid bruits.  Abdomen: no tenderness, no masses palpated. No hepatosplenomegaly. Bowel sounds positive.  Musculoskeletal: no clubbing / cyanosis. No joint deformity upper and lower extremities. Good ROM, no contractures. Normal muscle tone.  Skin: no rashes, lesions, ulcers. No induration Neurologic: CN 2-12 grossly intact. Sensation intact, DTR normal. Strength 5/5 in all 4.  Psychiatric: dementia trying to climb out of bed    Labs on Admission: I have personally reviewed following labs and imaging studies  CBC: Recent Labs  Lab 05/13/17 2356 05/14/17 0035  WBC 13.2*  --   NEUTROABS 12.3*   --   HGB 11.6* 12.6  HCT 33.7* 37.0  MCV 82.6  --   PLT 254  --    Basic Metabolic Panel: Recent Labs  Lab 05/13/17 2356 05/14/17 0035  NA 140 143  K 3.0* 3.0*  CL 103 104  CO2 15*  --  GLUCOSE 388* 403*  BUN 20 19  CREATININE 0.94 0.50  CALCIUM 9.4  --    GFR: CrCl cannot be calculated (Unknown ideal weight.). Liver Function Tests: No results for input(s): AST, ALT, ALKPHOS, BILITOT, PROT, ALBUMIN in the last 168 hours. No results for input(s): LIPASE, AMYLASE in the last 168 hours. No results for input(s): AMMONIA in the last 168 hours. Coagulation Profile: No results for input(s): INR, PROTIME in the last 168 hours. Cardiac Enzymes: No results for input(s): CKTOTAL, CKMB, CKMBINDEX, TROPONINI in the last 168 hours. BNP (last 3 results) No results for input(s): PROBNP in the last 8760 hours. HbA1C: No results for input(s): HGBA1C in the last 72 hours. CBG: Recent Labs  Lab 05/14/17 0044 05/14/17 0230 05/14/17 0335  GLUCAP 367* 321* 278*   Lipid Profile: No results for input(s): CHOL, HDL, LDLCALC, TRIG, CHOLHDL, LDLDIRECT in the last 72 hours. Thyroid Function Tests: No results for input(s): TSH, T4TOTAL, FREET4, T3FREE, THYROIDAB in the last 72 hours. Anemia Panel: No results for input(s): VITAMINB12, FOLATE, FERRITIN, TIBC, IRON, RETICCTPCT in the last 72 hours. Urine analysis:    Component Value Date/Time   COLORURINE COLORLESS (A) 05/13/2017 2356   APPEARANCEUR CLEAR 05/13/2017 2356   LABSPEC 1.009 05/13/2017 2356   PHURINE 5.0 05/13/2017 2356   GLUCOSEU >=500 (A) 05/13/2017 2356   HGBUR SMALL (A) 05/13/2017 2356   BILIRUBINUR NEGATIVE 05/13/2017 2356   KETONESUR 80 (A) 05/13/2017 2356   PROTEINUR NEGATIVE 05/13/2017 2356   UROBILINOGEN 0.2 02/23/2015 1433   NITRITE NEGATIVE 05/13/2017 2356   LEUKOCYTESUR NEGATIVE 05/13/2017 2356   Sepsis Labs: No results found for this or any previous visit (from the past 240 hour(s)).   Radiological Exams on  Admission: Dg Chest 2 View  Result Date: 05/14/2017 CLINICAL DATA:  Nausea and vomiting. EXAM: CHEST  2 VIEW COMPARISON:  February 02, 2016 FINDINGS: The heart, hila, and mediastinum are normal. No pneumothorax. Patchy opacity in the left base on the frontal view. No other interval change or acute abnormality. Previous vertebroplasty. IMPRESSION: Suggested mild opacity in the left base. Electronically Signed   By: Gerome Samavid  Williams III M.D   On: 05/14/2017 03:59    EKG: Independently reviewed.  Sinus tachycardia with background artifact.  QTC is prolonged at 495  Assessment/Plan DKA, type I: Acute.  Patient presented with blood sugar of 388 with anion gap elevated at 22 on initial CO2 15.  His last hemoglobin A1c was noted to be 9 on 02/02/2016.  Patient received approximately 3 L of normal saline IV fluids. - Admit to stepdown unit  - Glucose stabilizer protocol initiated  - Serial BMPs, hemoglobin A1c in a.m.  - Correct electrolytes as needed - Monitoring for AG closure and will transition to subcutaneous insulin once able - Social work  Suspect possible aspiration pneumonia: given history of nausea and vomiting. - Empiric antibiotics of Rocephin  Leukocytosis: Acute.  WBC elevated at 13.2.  Urinalysis was negative for any signs of infection, but chest x-ray showed possible left basilar opacity. - Recheck CBC in a.m.  Nausea and vomiting: Acute.  Patient with previous history of gastroparesis - Monitor intake and output - Advance diet as tolerated unable - Avoid antiemetics and Reglan as a could prolong QTC  Prolonged QTC: Initial QTC appears to be prolonged at 495. - Correct electrolyte abnormalities - Recheck EKG in a.m.  Hypokalemia: Acute.  Initial potassium noted to be 3 on admission.  Patient ordered to be given 4 rounds of 10  mEq of potassium chloride while in the ED. - Check magnesium level - Give additional 2 rounds of 10 mEq of potassium chloride - Continue to replace as  needed  Hypothyroidism - Restart levothyroxine when tolerating oral medication  Anxiety - Ativan IV at bedtime  Dementia: Stable.     DVT prophylaxis: lovenox  Code Status: DNR Family Communication: none  Disposition Plan: Discharge back to nursing facility once medically stable and 1-2 days Consults called: none  Admission status: inpatient  Clydie Braun MD [facility] Pager [phone]   If 7PM-7AM, please contact night-coverage [email or web address] Password [email or web address]  05/14/2017, 4:16 AM

## 2017-05-14 NOTE — Progress Notes (Signed)
Patient is seen and examined. Please see today's H&P for the details. 76 y.o. female with PMH of DM type I, TIA, CAD, hypothyroidism, fibromyalgia, chronic pain, anxiety, diabetes from nursing facility is admitted with mild DKA, possible pneumonia, electrolytes abnormalities. Will transition to sq insulin regimen per protocol as appropriate, cont to replace electrolytes, iv fluids, antibiotics for possible pneumonia.  Close monitor in step down unit.  Esperanza SheetsBURIEV, Brithney Bensen N

## 2017-05-14 NOTE — Progress Notes (Signed)
  Pt tx from [facility], resident of SNF. No family present at this time. The Pt continues to try to get oob, restless, mittens placed. This is unsuccessful,Posey beltt placed. The Pt still continues to pull at tubing. Order placed for [employer] sitter and 1 mg of IV Ativan. The  Pt is a poor historian and I am unable to uptain her hiostry. I will continue to monitor. Orders received by Elray McgregorMary Lynch NP.

## 2017-05-14 NOTE — ED Notes (Signed)
Pt has hx of dementia. Will respond but will not elaborate on condition. Pt is dry heaving and obviously uncomfortable. Shaking and repeatedly calls out for help. Pt reassured as much as possible.

## 2017-05-14 NOTE — ED Notes (Signed)
ED TO INPATIENT HANDOFF REPORT  Name/Age/Gender Beth Payne 76 y.o. female  Code Status    Code Status Orders  (From admission, onward)        Start     Ordered   05/14/17 0436  Do not attempt resuscitation (DNR)  Continuous    Question Answer Comment  In the event of cardiac or respiratory ARREST Do not call a "code blue"   In the event of cardiac or respiratory ARREST Do not perform Intubation, CPR, defibrillation or ACLS   In the event of cardiac or respiratory ARREST Use medication by any route, position, wound care, and other measures to relive pain and suffering. May use oxygen, suction and manual treatment of airway obstruction as needed for comfort.      05/14/17 0436    Code Status History    Date Active Date Inactive Code Status Order ID Comments User Context   05/14/2017 04:27 05/14/2017 04:36 Full Code [id number]  Norval Morton, MD ED   02/02/2016 17:48 02/03/2016 18:46 Full Code [id number]  Thurnell Lose, MD ED   01/19/2016 11:59 01/23/2016 18:56 DNR [id number]  Waldemar Dickens, MD ED   01/19/2016 11:59 01/19/2016 11:59 DNR [id number]  Waldemar Dickens, MD ED   12/14/2015 05:13 12/15/2015 19:33 DNR [id number]  Edwin Dada, MD Inpatient   12/14/2015 05:06 12/14/2015 05:13 DNR [id number]  Edwin Dada, MD Inpatient   11/20/2015 22:31 11/24/2015 16:57 DNR [id number]  [name], DO ED   06/21/2015 15:28 06/27/2015 21:35 DNR [id number]  Modena Jansky, MD Inpatient   06/21/2015 01:13 06/21/2015 15:28 Full Code [id number]  [name], DO ED   05/20/2015 00:42 05/22/2015 20:40 Full Code [id number]  Rise Patience, MD Inpatient   04/24/2015 15:20 04/26/2015 14:54 Full Code [id number]  Radene Gunning, NP Inpatient   02/24/2015 00:20 02/27/2015 18:25 Full Code [id number]  Theressa Millard, MD ED   02/23/2015 21:39 02/24/2015 00:20 Full Code [id number]  Theressa Millard, MD ED   01/01/2015 00:47 01/01/2015 20:25 DNR [id number]  [name], DO ED   01/01/2015 00:44 01/01/2015 00:47 Full Code [id number]  [name], DO ED   12/23/2014 17:52 12/27/2014 17:40 Full Code [id number]  Geradine Girt, DO Inpatient   12/12/2014 13:04 12/16/2014 21:03 DNR [id number]  Samella Parr, NP ED    Advance Directive Documentation     Most Recent Value  Type of Advance Directive  Out of facility DNR (pink MOST or yellow form)  Pre-existing out of facility DNR order (yellow form or pink MOST form)  Pink MOST form placed in chart (order not valid for inpatient use)  "MOST" Form in Place?  No data      Home/SNF/Other Nursing Home  Chief Complaint No admission diagnoses are documented for this encounter.  Level of Care/Admitting Diagnosis ED Disposition    ED Disposition Condition Comment   Admit  Hospital Area: [place] [[id number]]  Level of Care: Stepdown [14]  Admit to SDU based on following criteria: Other see comments  Comments: DKA  Diagnosis: DKA, type 1 [facility]) [[id number]]  Admitting Physician: Norval Morton [[id number]]  Attending Physician: Norval Morton [[id number]]  Estimated length of stay: past midnight tomorrow  Certification:: I certify this patient will need inpatient services for at least 2 midnights  PT Class (Do Not Modify): Inpatient [101]  PT Acc Code (Do Not Modify): Private [1]       Medical History Past  Medical History:  Diagnosis Date  . Anxiety   . Arthritis    "fingers" (12/23/2014)  . Chronic lower back pain   . Coronary artery disease   . Dementia   . Depression   . Fibromyalgia   . Fracture, ankle 12/23/2014  . GERD (gastroesophageal reflux disease) dx'd 1995  . Heart murmur   . NSTEMI (non-ST elevated myocardial infarction) ([place]) 12/13/2014  . Osteopenia dx'd 2007  . Retinopathy   . TIA (transient ischemic attack)   . Type I diabetes mellitus ([place]) dx'd 1953   "I'm on a pup" (12/23/2014)    Allergies Allergies  Allergen Reactions  . Diclofenac Sodium Swelling and Other (See  Comments)    Blurred vision  . Doxycycline Calcium Swelling and Other (See Comments)    Blurred vision  . Calcium-Containing Compounds Other (See Comments)    Calcium listed as an allergy on [facility] 08/10/15  . Codeine Nausea And Vomiting and Other (See Comments)    Dizziness   . Zocor [Simvastatin] Other (See Comments)    Dizziness, weakness     IV Location/Drains/Wounds Patient Lines/Drains/Airways Status   Active Line/Drains/Airways    Name:   Placement date:   Placement time:   Site:   Days:   Peripheral IV 05/13/17 Right Antecubital   05/13/17    -    Antecubital   1   Peripheral IV 05/14/17 Left Hand   05/14/17    0227    Hand   less than 1          Labs/Imaging Results for orders placed or performed during the hospital encounter of 05/13/17 (from the past 48 hour(s))  Blood gas, venous     Status: Abnormal (Preliminary result)   Collection Time: 05/13/17 12:21 AM  Result Value Ref Range   FIO2 PENDING    O2 Content PENDING L/min   pH, Ven 7.380 7.250 - 7.430   pCO2, Ven 26.4 (L) 44.0 - 60.0 mmHg   pO2, Ven 35.9 32.0 - 45.0 mmHg   Bicarbonate 15.3 (L) 20.0 - 28.0 mmol/L   Acid-base deficit 8.1 (H) 0.0 - 2.0 mmol/L   O2 Saturation 61.6 %   Patient temperature 98.6    Collection site VEIN    Drawn by [phone]    Sample type VEIN   CBC with Differential     Status: Abnormal   Collection Time: 05/13/17 11:56 PM  Result Value Ref Range   WBC 13.2 (H) 4.0 - 10.5 K/uL   RBC 4.08 3.87 - 5.11 MIL/uL   Hemoglobin 11.6 (L) 12.0 - 15.0 g/dL   HCT 33.7 (L) 36.0 - 46.0 %   MCV 82.6 78.0 - 100.0 fL   MCH 28.4 26.0 - 34.0 pg   MCHC 34.4 30.0 - 36.0 g/dL   RDW 14.5 11.5 - 15.5 %   Platelets 254 150 - 400 K/uL   Neutrophils Relative % 93 %   Neutro Abs 12.3 (H) 1.7 - 7.7 K/uL   Lymphocytes Relative 5 %   Lymphs Abs 0.6 (L) 0.7 - 4.0 K/uL   Monocytes Relative 2 %   Monocytes Absolute 0.3 0.1 - 1.0 K/uL   Eosinophils Relative 0 %   Eosinophils Absolute 0.0 0.0 - 0.7 K/uL    Basophils Relative 0 %   Basophils Absolute 0.0 0.0 - 0.1 K/uL  Basic metabolic panel     Status: Abnormal   Collection Time: 05/13/17 11:56 PM  Result Value Ref Range   Sodium 140 135 -  145 mmol/L    Comment: REPEATED TO VERIFY   Potassium 3.0 (L) 3.5 - 5.1 mmol/L    Comment: REPEATED TO VERIFY   Chloride 103 101 - 111 mmol/L    Comment: REPEATED TO VERIFY   CO2 15 (L) 22 - 32 mmol/L    Comment: REPEATED TO VERIFY   Glucose, Bld 388 (H) 65 - 99 mg/dL   BUN 20 6 - 20 mg/dL   Creatinine, Ser 0.94 0.44 - 1.00 mg/dL   Calcium 9.4 8.9 - 10.3 mg/dL    Comment: REPEATED TO VERIFY   GFR calc non Af Amer 58 (L) >60 mL/min   GFR calc Af Amer >60 >60 mL/min    Comment: (NOTE) The eGFR has been calculated using the CKD EPI equation. This calculation has not been validated in all clinical situations. eGFR's persistently <60 mL/min signify possible Chronic Kidney Disease.    Anion gap 22 (H) 5 - 15  Urinalysis, Routine w reflex microscopic     Status: Abnormal   Collection Time: 05/13/17 11:56 PM  Result Value Ref Range   Color, Urine COLORLESS (A) YELLOW   APPearance CLEAR CLEAR   Specific Gravity, Urine 1.009 1.005 - 1.030   pH 5.0 5.0 - 8.0   Glucose, UA >=500 (A) NEGATIVE mg/dL   Hgb urine dipstick SMALL (A) NEGATIVE   Bilirubin Urine NEGATIVE NEGATIVE   Ketones, ur 80 (A) NEGATIVE mg/dL   Protein, ur NEGATIVE NEGATIVE mg/dL   Nitrite NEGATIVE NEGATIVE   Leukocytes, UA NEGATIVE NEGATIVE   RBC / HPF 0-5 0 - 5 RBC/hpf   WBC, UA 0-5 0 - 5 WBC/hpf   Bacteria, UA RARE (A) NONE SEEN   Squamous Epithelial / LPF NONE SEEN NONE SEEN  I-Stat Chem 8, ED     Status: Abnormal   Collection Time: 05/14/17 12:35 AM  Result Value Ref Range   Sodium 143 135 - 145 mmol/L   Potassium 3.0 (L) 3.5 - 5.1 mmol/L   Chloride 104 101 - 111 mmol/L   BUN 19 6 - 20 mg/dL   Creatinine, Ser 0.50 0.44 - 1.00 mg/dL   Glucose, Bld 403 (H) 65 - 99 mg/dL   Calcium, Ion 1.14 (L) 1.15 - 1.40 mmol/L   TCO2 15  (L) 22 - 32 mmol/L   Hemoglobin 12.6 12.0 - 15.0 g/dL   HCT 37.0 36.0 - 46.0 %  CBG monitoring, ED     Status: Abnormal   Collection Time: 05/14/17 12:44 AM  Result Value Ref Range   Glucose-Capillary 367 (H) 65 - 99 mg/dL   Comment 1 Notify RN    Comment 2 Document in Chart   CBG monitoring, ED     Status: Abnormal   Collection Time: 05/14/17  2:30 AM  Result Value Ref Range   Glucose-Capillary 321 (H) 65 - 99 mg/dL  CBG monitoring, ED     Status: Abnormal   Collection Time: 05/14/17  3:35 AM  Result Value Ref Range   Glucose-Capillary 278 (H) 65 - 99 mg/dL  CBG monitoring, ED     Status: Abnormal   Collection Time: 05/14/17  4:36 AM  Result Value Ref Range   Glucose-Capillary 230 (H) 65 - 99 mg/dL   Dg Chest 2 View  Result Date: 05/14/2017 CLINICAL DATA:  Nausea and vomiting. EXAM: CHEST  2 VIEW COMPARISON:  February 02, 2016 FINDINGS: The heart, hila, and mediastinum are normal. No pneumothorax. Patchy opacity in the left base on the frontal view. No other  interval change or acute abnormality. Previous vertebroplasty. IMPRESSION: Suggested mild opacity in the left base. Electronically Signed   By: Dorise Bullion III M.D   On: 05/14/2017 03:59    Pending Labs Unresulted Labs (From admission, onward)   Start     Ordered   05/14/17 0430  CBC with Differential/Platelet  Once,   R     05/14/17 0429   05/14/17 0428  Hemoglobin A1c  Once,   R     05/14/17 0427   05/14/17 0426  Magnesium  Once,   R     05/14/17 0427   05/14/17 8416  Basic metabolic panel  STAT Now then every 4 hours ,   STAT     05/14/17 0427      Vitals/Pain Today's Vitals   05/14/17 0230 05/14/17 0330 05/14/17 0400 05/14/17 0430  BP: (!) 156/86 (!) 158/67 (!) 152/67 (!) 143/76  Pulse: (!) 107 95 (!) 109 (!) 105  Resp: (!) 33 (!) 22 (!) 24 (!) 26  Temp:      TempSrc:      SpO2: 100% 97% 98% 99%    Isolation Precautions No active isolations  Medications Medications  potassium chloride 10 mEq in 100  mL IVPB (10 mEq Intravenous New Bag/Given 05/14/17 0447)  0.9 %  sodium chloride infusion (not administered)  dextrose 5 %-0.45 % sodium chloride infusion (not administered)  insulin regular (NOVOLIN R,HUMULIN R) 100 Units in sodium chloride 0.9 % 100 mL (1 Units/mL) infusion (5.1 Units/hr Intravenous Transfusing/Transfer 05/14/17 0453)  enoxaparin (LOVENOX) injection 40 mg (not administered)  acetaminophen (TYLENOL) tablet 650 mg (not administered)    Or  acetaminophen (TYLENOL) suppository 650 mg (not administered)  ipratropium-albuterol (DUONEB) 0.5-2.5 (3) MG/3ML nebulizer solution 3 mL (not administered)  potassium chloride 10 mEq in 100 mL IVPB (not administered)  sodium chloride 0.9 % bolus 2,000 mL (0 mLs Intravenous Stopped 05/14/17 0209)    Mobility non-ambulatory

## 2017-05-14 NOTE — ED Notes (Signed)
Bed assigned rm 1224 @0433 

## 2017-05-14 NOTE — ED Notes (Signed)
Report called to kristine [phone] @ 0500

## 2017-05-15 DIAGNOSIS — E1010 Type 1 diabetes mellitus with ketoacidosis without coma: Principal | ICD-10-CM

## 2017-05-15 LAB — BASIC METABOLIC PANEL
Anion gap: 11 (ref 5–15)
Anion gap: 13 (ref 5–15)
Anion gap: 14 (ref 5–15)
BUN: 6 mg/dL (ref 6–20)
BUN: 7 mg/dL (ref 6–20)
BUN: 9 mg/dL (ref 6–20)
CALCIUM: 9.1 mg/dL (ref 8.9–10.3)
CALCIUM: 9.1 mg/dL (ref 8.9–10.3)
CHLORIDE: 104 mmol/L (ref 101–111)
CO2: 20 mmol/L — ABNORMAL LOW (ref 22–32)
CO2: 19 mmol/L — AB (ref 22–32)
CO2: 22 mmol/L (ref 22–32)
CREATININE: 0.62 mg/dL (ref 0.44–1.00)
CREATININE: 0.6 mg/dL (ref 0.44–1.00)
CREATININE: 0.6 mg/dL (ref 0.44–1.00)
Calcium: 9.2 mg/dL (ref 8.9–10.3)
Chloride: 107 mmol/L (ref 101–111)
Chloride: 108 mmol/L (ref 101–111)
GFR calc Af Amer: >60 mL/min (ref >60)
GFR calc Af Amer: >60 mL/min (ref >60)
GFR calc non Af Amer: >60 mL/min (ref >60)
GFR calc non Af Amer: >60 mL/min (ref >60)
GLUCOSE: 167 mg/dL — AB (ref 65–99)
GLUCOSE: 121 mg/dL — AB (ref 65–99)
Glucose, Bld: 165 mg/dL — ABNORMAL HIGH (ref 65–99)
Potassium: 3.1 mmol/L — ABNORMAL LOW (ref 3.5–5.1)
Potassium: 3.2 mmol/L — ABNORMAL LOW (ref 3.5–5.1)
Potassium: 3.2 mmol/L — ABNORMAL LOW (ref 3.5–5.1)
SODIUM: 137 mmol/L (ref 135–145)
Sodium: 140 mmol/L (ref 135–145)
Sodium: 141 mmol/L (ref 135–145)

## 2017-05-15 LAB — GLUCOSE, CAPILLARY
GLUCOSE-CAPILLARY: 114 mg/dL — AB (ref 65–99)
GLUCOSE-CAPILLARY: 62 mg/dL — AB (ref 65–99)
GLUCOSE-CAPILLARY: 125 mg/dL — AB (ref 65–99)
GLUCOSE-CAPILLARY: 274 mg/dL — AB (ref 65–99)
GLUCOSE-CAPILLARY: 240 mg/dL — AB (ref 65–99)
GLUCOSE-CAPILLARY: 116 mg/dL — AB (ref 65–99)
Glucose-Capillary: 105 mg/dL — ABNORMAL HIGH (ref 65–99)
Glucose-Capillary: 106 mg/dL — ABNORMAL HIGH (ref 65–99)
Glucose-Capillary: 132 mg/dL — ABNORMAL HIGH (ref 65–99)
Glucose-Capillary: 142 mg/dL — ABNORMAL HIGH (ref 65–99)
Glucose-Capillary: 191 mg/dL — ABNORMAL HIGH (ref 65–99)
Glucose-Capillary: 223 mg/dL — ABNORMAL HIGH (ref 65–99)
Glucose-Capillary: 151 mg/dL — ABNORMAL HIGH (ref 65–99)
Glucose-Capillary: 166 mg/dL — ABNORMAL HIGH (ref 65–99)
Glucose-Capillary: 167 mg/dL — ABNORMAL HIGH (ref 65–99)

## 2017-05-15 MED ORDER — MAGNESIUM HYDROXIDE 400 MG/5ML PO SUSP: 30.0000 mL | Freq: Every day | ORAL | Status: DC | PRN

## 2017-05-15 MED ORDER — POTASSIUM CHLORIDE CRYS ER 20 MEQ PO TBCR
40.0000 meq | EXTENDED_RELEASE_TABLET | Freq: Once | ORAL | Status: AC
  Administered 2017-05-15: 40 meq via ORAL
  Filled 2017-05-15: qty 2

## 2017-05-15 MED ORDER — LAMOTRIGINE 100 MG PO TABS
100.0000 mg | ORAL_TABLET | Freq: Every day | ORAL | Status: DC
  Administered 2017-05-15 – 2017-05-17 (×3): 100 mg via ORAL
  Filled 2017-05-15 (×3): qty 1

## 2017-05-15 MED ORDER — POTASSIUM CHLORIDE 10 MEQ/100ML IV SOLN
10.0000 meq | INTRAVENOUS | Status: AC
  Administered 2017-05-15 (×4): 10 meq via INTRAVENOUS
  Filled 2017-05-15 (×4): qty 100

## 2017-05-15 MED ORDER — LEVOTHYROXINE SODIUM 50 MCG PO TABS
50.0000 ug | ORAL_TABLET | Freq: Every day | ORAL | Status: DC
  Administered 2017-05-15 – 2017-05-18 (×4): 50 ug via ORAL
  Filled 2017-05-15 (×4): qty 1

## 2017-05-15 MED ORDER — HYDROCODONE-ACETAMINOPHEN 5-325 MG PO TABS
1.0000 | ORAL_TABLET | Freq: Once | ORAL | Status: AC
  Administered 2017-05-15: 1 via ORAL
  Filled 2017-05-15: qty 1

## 2017-05-15 MED ORDER — PANTOPRAZOLE SODIUM 40 MG PO TBEC
40.0000 mg | DELAYED_RELEASE_TABLET | Freq: Two times a day (BID) | ORAL | Status: DC
  Administered 2017-05-15 – 2017-05-18 (×7): 40 mg via ORAL
  Filled 2017-05-15 (×7): qty 1

## 2017-05-15 MED ORDER — SODIUM CHLORIDE 0.9 % IV SOLN: INTRAVENOUS | Status: DC

## 2017-05-15 MED ORDER — INSULIN ASPART 100 UNIT/ML ~~LOC~~ SOLN
0.0000 [IU] | Freq: Three times a day (TID) | SUBCUTANEOUS | Status: DC
  Administered 2017-05-15: 2 [IU] via SUBCUTANEOUS
  Administered 2017-05-15: 3 [IU] via SUBCUTANEOUS
  Administered 2017-05-15: 5 [IU] via SUBCUTANEOUS
  Administered 2017-05-16: 3 [IU] via SUBCUTANEOUS
  Administered 2017-05-16: 5 [IU] via SUBCUTANEOUS
  Administered 2017-05-17: 7 [IU] via SUBCUTANEOUS
  Administered 2017-05-17: 5 [IU] via SUBCUTANEOUS
  Administered 2017-05-17: 2 [IU] via SUBCUTANEOUS
  Administered 2017-05-18: 3 [IU] via SUBCUTANEOUS
  Administered 2017-05-18: 9 [IU] via SUBCUTANEOUS

## 2017-05-15 MED ORDER — INSULIN DETEMIR 100 UNIT/ML ~~LOC~~ SOLN
5.0000 [IU] | Freq: Every day | SUBCUTANEOUS | Status: DC
  Administered 2017-05-15: 5 [IU] via SUBCUTANEOUS
  Filled 2017-05-15 (×2): qty 0.05

## 2017-05-15 MED ORDER — AMLODIPINE BESYLATE 10 MG PO TABS
10.0000 mg | ORAL_TABLET | Freq: Every day | ORAL | Status: DC
  Administered 2017-05-15 – 2017-05-18 (×4): 10 mg via ORAL
  Filled 2017-05-15 (×4): qty 1

## 2017-05-15 NOTE — Progress Notes (Addendum)
Patient ID: Beth Payne, female   DOB: 02/15/1941, 76 y.o.   MRN: [id number]    PROGRESS NOTE   Beth Aristahomasine C Carlton  WUJ:[id number] DOB: 03/24/1941 DOA: 05/13/2017  PCP: Adrian PrinceSouth, Stephen, MD   Brief Narrative:  76 y.o. female with medical history significant of brittle DM type I, dementia, TIA, MI, CAD, hypothyroidism, fibromyalgia, chronic pain, and GERD; who presented from nursing facility with elevated sugar levels. Review of records indicate that pt has been admitted 5 times in 2017 for DKA.   Assessment & Plan:   Principal Problem:   DKA, type 1 (HCC) - AG closed - transition to SSI - BMP in AM - advance diet  Active Problems:   Hypokalemia - supplement and repeat BMP in AM    Dementia - stable     Prolonged Q-T interval on ECG - monitor on tele    Nausea and vomiting - from DKA - denies any this AM    Leukocytosis, LLL PNA - placed on Rocephin, WBC is trending down - did not place on Zithromax due to prolonged QT - if clinical status worse, will broaden ABX spectrum but for now keep on rocephin  - sputum cultures requested, strep pneumo and urine legionella    DVT prophylaxis: Lovenox SQ Code Status: DNR Family Communication: Patient at bedside  Disposition Plan: to be determined   Consultants:   None  Procedures:   None  Antimicrobials:   Rocephin 12/08 -->  Subjective: No events overnight.  Objective: Vitals:   05/15/17 0445 05/15/17 0500 05/15/17 0700 05/15/17 0800  BP:  (!) 165/79 (!) 178/80   Pulse: 84 84 82   Resp: (!) 21 18 11    Temp:    99.2 F (37.3 C)  TempSrc:    Oral  SpO2: 95% 96% 96%   Weight:      Height:        Intake/Output Summary (Last 24 hours) at 05/15/2017 1141 Last data filed at 05/15/2017 0641 Gross per 24 hour  Intake 2451.87 ml  Output 820 ml  Net 1631.87 ml   Filed Weights   05/14/17 0700  Weight: 43.9 kg (96 lb 12.5 oz)    Examination:  General exam: Appears calm and comfortable  Respiratory system:  Respiratory effort normal. Mild rhonchi at bases  Cardiovascular system: S1 & S2 heard, RRR. No JVD, rubs, gallops or clicks.  Gastrointestinal system: Abdomen is nondistended, soft and nontender. No organomegaly or masses felt. Normal bowel sounds heard. Central nervous system: Alert to name only, strength equal in upper and lower extremities  Extremities: Symmetric 5 x 5 power. Skin: No rashes, lesions or ulcers Psychiatry: Judgement and insight appear normal. Mood & affect appropriate.    Data Reviewed: I have personally reviewed following labs and imaging studies  CBC: Recent Labs  Lab 05/13/17 2356 05/14/17 0035 05/14/17 0710  WBC 13.2*  --  12.2*  NEUTROABS 12.3*  --  11.0*  HGB 11.6* 12.6 10.3*  HCT 33.7* 37.0 29.8*  MCV 82.6  --  82.3  PLT 254  --  209   Basic Metabolic Panel: Recent Labs  Lab 05/14/17 0710  05/14/17 1539 05/14/17 1757 05/14/17 2338 05/15/17 0255 05/15/17 0651  NA 143   < > 141 141 140 141 137  K 2.9*   < > 3.0* 3.5 3.2* 3.1* 3.2*  CL 110   < > 108 107 107 108 104  CO2 18*   < > 19* 20* 19* 22 20*  GLUCOSE 129*   < >  147* 166* 167* 121* 165*  BUN 14   < > 10 10 9 7 6   CREATININE 0.74   < > 0.69 0.65 0.62 0.60 0.60  CALCIUM 8.8*   < > 9.3 9.0 9.2 9.1 9.1  MG 1.7  --   --   --   --   --   --    < > = values in this interval not displayed.   HbA1C: Recent Labs    05/14/17 0710  HGBA1C 8.7*   CBG: Recent Labs  Lab 05/15/17 0044 05/15/17 0143 05/15/17 0245 05/15/17 0345 05/15/17 0445  GLUCAP 191* 132* 105* 125* 142*   Urine analysis:    Component Value Date/Time   COLORURINE COLORLESS (A) 05/13/2017 2356   APPEARANCEUR CLEAR 05/13/2017 2356   LABSPEC 1.009 05/13/2017 2356   PHURINE 5.0 05/13/2017 2356   GLUCOSEU >=500 (A) 05/13/2017 2356   HGBUR SMALL (A) 05/13/2017 2356   BILIRUBINUR NEGATIVE 05/13/2017 2356   KETONESUR 80 (A) 05/13/2017 2356   PROTEINUR NEGATIVE 05/13/2017 2356   UROBILINOGEN 0.2 02/23/2015 1433   NITRITE  NEGATIVE 05/13/2017 2356   LEUKOCYTESUR NEGATIVE 05/13/2017 2356    Recent Results (from the past 240 hour(s))  MRSA PCR Screening     Status: None   Collection Time: 05/14/17  7:50 AM  Result Value Ref Range Status   MRSA by PCR NEGATIVE NEGATIVE Final    Radiology Studies: Dg Chest 2 View  Result Date: 05/14/2017 CLINICAL DATA:  Nausea and vomiting. EXAM: CHEST  2 VIEW COMPARISON:  February 02, 2016 FINDINGS: The heart, hila, and mediastinum are normal. No pneumothorax. Patchy opacity in the left base on the frontal view. No other interval change or acute abnormality. Previous vertebroplasty. IMPRESSION: Suggested mild opacity in the left base. Electronically Signed   By: Gerome Samavid  Williams III M.D   On: 05/14/2017 03:59    Scheduled Meds: . enoxaparin (LOVENOX) injection  40 mg Subcutaneous Q24H  . insulin aspart  0-9 Units Subcutaneous TID WC   Continuous Infusions: . sodium chloride 75 mL/hr at 05/15/17 0925  . cefTRIAXone (ROCEPHIN)  IV 1 g (05/15/17 0924)     LOS: 1 day   Time spent: 35 minutes   Debbora PrestoIskra Magick-Trejon Duford, MD [facility] Pager [phone]  If 7PM-7AM, please contact night-coverage [email or web address] Password [email or web address] 05/15/2017, 11:41 AM

## 2017-05-16 LAB — CBC
HEMATOCRIT: 33.5 % — AB (ref 36.0–46.0)
Hemoglobin: 11.5 g/dL — ABNORMAL LOW (ref 12.0–15.0)
MCH: 28.1 pg (ref 26.0–34.0)
MCHC: 34.3 g/dL (ref 30.0–36.0)
MCV: 81.9 fL (ref 78.0–100.0)
Platelets: 191 10*3/uL (ref 150–400)
RBC: 4.09 MIL/uL (ref 3.87–5.11)
RDW: 14.2 % (ref 11.5–15.5)
WBC: 6.4 10*3/uL (ref 4.0–10.5)

## 2017-05-16 LAB — GLUCOSE, CAPILLARY
GLUCOSE-CAPILLARY: 74 mg/dL (ref 65–99)
GLUCOSE-CAPILLARY: 183 mg/dL — AB (ref 65–99)
Glucose-Capillary: 244 mg/dL — ABNORMAL HIGH (ref 65–99)
Glucose-Capillary: 297 mg/dL — ABNORMAL HIGH (ref 65–99)

## 2017-05-16 LAB — BASIC METABOLIC PANEL
Anion gap: 11 (ref 5–15)
BUN: <5 mg/dL — ABNORMAL LOW (ref 6–20)
CO2: 22 mmol/L (ref 22–32)
Calcium: 9 mg/dL (ref 8.9–10.3)
Chloride: 100 mmol/L — ABNORMAL LOW (ref 101–111)
Creatinine, Ser: 0.42 mg/dL — ABNORMAL LOW (ref 0.44–1.00)
GFR calc Af Amer: >60 mL/min (ref >60)
GFR calc non Af Amer: >60 mL/min (ref >60)
Glucose, Bld: 184 mg/dL — ABNORMAL HIGH (ref 65–99)
Potassium: 2.7 mmol/L — CL (ref 3.5–5.1)
Sodium: 133 mmol/L — ABNORMAL LOW (ref 135–145)

## 2017-05-16 LAB — MAGNESIUM: Magnesium: 1.5 mg/dL — ABNORMAL LOW (ref 1.7–2.4)

## 2017-05-16 MED ORDER — HYDRALAZINE HCL 20 MG/ML IJ SOLN
5.0000 mg | Freq: Four times a day (QID) | INTRAMUSCULAR | Status: DC | PRN
  Administered 2017-05-16: 5 mg via INTRAVENOUS
  Filled 2017-05-16: qty 1

## 2017-05-16 MED ORDER — MAGNESIUM SULFATE 2 GM/50ML IV SOLN
2.0000 g | Freq: Once | INTRAVENOUS | Status: AC
  Administered 2017-05-16: 2 g via INTRAVENOUS
  Filled 2017-05-16: qty 50

## 2017-05-16 MED ORDER — INSULIN DETEMIR 100 UNIT/ML ~~LOC~~ SOLN
10.0000 [IU] | Freq: Every day | SUBCUTANEOUS | Status: DC
  Administered 2017-05-16 – 2017-05-18 (×3): 10 [IU] via SUBCUTANEOUS
  Filled 2017-05-16 (×3): qty 0.1

## 2017-05-16 MED ORDER — POTASSIUM CHLORIDE CRYS ER 20 MEQ PO TBCR
40.0000 meq | EXTENDED_RELEASE_TABLET | Freq: Two times a day (BID) | ORAL | Status: AC
  Administered 2017-05-16 (×2): 40 meq via ORAL
  Filled 2017-05-16 (×2): qty 2

## 2017-05-16 NOTE — Progress Notes (Signed)
PT Cancellation Note  Patient Details Name: Theron Aristahomasine C Lanes MRN: [id number] DOB: 12/16/1940   Cancelled Treatment:    Reason Eval/Treat Not Completed: Medical issues which prohibited therapy(K+ 2.7) *Pt Is from skilled facility   [facility] CenterWILLIAMS,Prophet Renwick 05/16/2017, 9:31 AM

## 2017-05-16 NOTE — Care Management Note (Signed)
Case Management Note  Patient Details  Name: Theron Aristahomasine C Tortorelli MRN: [id number] Date of Birth: 12/21/1940  Subjective/Objective:                  dka  Action/Plan: Will follow for cm needs  Expected Discharge Date:                  Expected Discharge Plan:  Home/Self Care  In-House Referral:     Discharge planning Services  CM Consult  Post Acute Care Choice:    Choice offered to:     DME Arranged:    DME Agency:     HH Arranged:    HH Agency:     Status of Service:  In process, will continue to follow  If discussed at Long Length of Stay Meetings, dates discussed:    Additional Comments:  Golda AcreDavis, Rhonda Lynn, RN 05/16/2017, 8:04 AM

## 2017-05-16 NOTE — Progress Notes (Signed)
Patient ID: Beth Payne, female   DOB: 11/15/1940, 76 y.o.   MRN: [id number]    PROGRESS NOTE   Beth Aristahomasine C Seib  WUJ:[id number] DOB: 01/25/1941 DOA: 05/13/2017  PCP: Adrian PrinceSouth, Stephen, MD   Brief Narrative:  76 y.o. female with medical history significant of brittle DM type I, dementia, TIA, MI, CAD, hypothyroidism, fibromyalgia, chronic pain, and GERD; who presented from nursing facility with elevated sugar levels. Review of records indicate that pt has been admitted 5 times in 2017 for DKA.   Assessment & Plan:   Principal Problem:   DKA, type 1 (HCC) - AG closed - has been transitioned to SSI and Insulin Levemir - will continue to adjust dose as clinically indicated  - transfer to tele   Active Problems:   Hypokalemia - check Mg - supplement K and repeat BMP in AM    Dementia - stable     Prolonged Q-T interval on ECG - monitor on tele    Nausea and vomiting - resolved     Leukocytosis, LLL PNA - placed on Rocephin, will continue same regimen for now - WBC is trending down and is WNL this AM - did not place on Zithromax due to prolonged QT  DVT prophylaxis: Lovenox SQ Code Status: DNR Family Communication: Patient at bedside  Disposition Plan: transfer to tele   Consultants:   None  Procedures:   None  Antimicrobials:   Rocephin 12/08 -->  Subjective: No events overnight.   Objective: Vitals:   05/16/17 0200 05/16/17 0400 05/16/17 0428 05/16/17 0600  BP: (!) 151/63 (!) 124/56  (!) 136/55  Pulse: 82 81  80  Resp: 14 20  20   Temp:   98.4 F (36.9 C)   TempSrc:   Oral   SpO2: 98% 96%  97%  Weight:      Height:        Intake/Output Summary (Last 24 hours) at 05/16/2017 0723 Last data filed at 05/16/2017 0600 Gross per 24 hour  Intake 1543.75 ml  Output 2350 ml  Net -806.25 ml   Filed Weights   05/14/17 0700  Weight: 43.9 kg (96 lb 12.5 oz)    Physical Exam  Constitutional: Appears calm, confused but NAD CVS: RRR, S1/S2 +, no  murmurs, no gallops, no carotid bruit.  Pulmonary: Effort and breath sounds normal, mild rhonchi at bases  Abdominal: Soft. BS +,  no distension, tenderness, rebound or guarding.  Musculoskeletal: Normal range of motion. No edema and no tenderness.  Neuro: Alert. Normal reflexes, muscle tone coordination.  Data Reviewed: I have personally reviewed following labs and imaging studies  CBC: Recent Labs  Lab 05/13/17 2356 05/14/17 0035 05/14/17 0710 05/16/17 0321  WBC 13.2*  --  12.2* 6.4  NEUTROABS 12.3*  --  11.0*  --   HGB 11.6* 12.6 10.3* 11.5*  HCT 33.7* 37.0 29.8* 33.5*  MCV 82.6  --  82.3 81.9  PLT 254  --  209 191   Basic Metabolic Panel: Recent Labs  Lab 05/14/17 0710  05/14/17 1757 05/14/17 2338 05/15/17 0255 05/15/17 0651 05/16/17 0321  NA 143   < > 141 140 141 137 133*  K 2.9*   < > 3.5 3.2* 3.1* 3.2* 2.7*  CL 110   < > 107 107 108 104 100*  CO2 18*   < > 20* 19* 22 20* 22  GLUCOSE 129*   < > 166* 167* 121* 165* 184*  BUN 14   < > 10 9 7  6 <5*  CREATININE 0.74   < > 0.65 0.62 0.60 0.60 0.42*  CALCIUM 8.8*   < > 9.0 9.2 9.1 9.1 9.0  MG 1.7  --   --   --   --   --   --    < > = values in this interval not displayed.   HbA1C: Recent Labs    05/14/17 0710  HGBA1C 8.7*   CBG: Recent Labs  Lab 05/15/17 0743 05/15/17 0851 05/15/17 1256 05/15/17 1602 05/15/17 2150  GLUCAP 167* 151* 274* 240* 116*   Urine analysis:    Component Value Date/Time   COLORURINE COLORLESS (A) 05/13/2017 2356   APPEARANCEUR CLEAR 05/13/2017 2356   LABSPEC 1.009 05/13/2017 2356   PHURINE 5.0 05/13/2017 2356   GLUCOSEU >=500 (A) 05/13/2017 2356   HGBUR SMALL (A) 05/13/2017 2356   BILIRUBINUR NEGATIVE 05/13/2017 2356   KETONESUR 80 (A) 05/13/2017 2356   PROTEINUR NEGATIVE 05/13/2017 2356   UROBILINOGEN 0.2 02/23/2015 1433   NITRITE NEGATIVE 05/13/2017 2356   LEUKOCYTESUR NEGATIVE 05/13/2017 2356    Recent Results (from the past 240 hour(s))  MRSA PCR Screening      Status: None   Collection Time: 05/14/17  7:50 AM  Result Value Ref Range Status   MRSA by PCR NEGATIVE NEGATIVE Final    Radiology Studies: No results found.  Scheduled Meds: . amLODipine  10 mg Oral Daily  . enoxaparin (LOVENOX) injection  40 mg Subcutaneous Q24H  . insulin aspart  0-9 Units Subcutaneous TID WC  . insulin detemir  5 Units Subcutaneous Daily  . lamoTRIgine  100 mg Oral QHS  . levothyroxine  50 mcg Oral QAC breakfast  . pantoprazole  40 mg Oral BID  . potassium chloride  40 mEq Oral BID   Continuous Infusions: . sodium chloride 75 mL/hr at 05/16/17 0600  . cefTRIAXone (ROCEPHIN)  IV Stopped (05/15/17 0954)     LOS: 2 days   Time spent: 25 minutes   Debbora PrestoIskra Magick-Quinlan Vollmer, MD [facility] Pager [phone]  If 7PM-7AM, please contact night-coverage [email or web address] Password [email or web address] 05/16/2017, 7:23 AM

## 2017-05-16 NOTE — Progress Notes (Signed)
CRITICAL VALUE ALERT  Critical Value: K 2.7  Date & Time Notied:  0447  Provider Notified: [place]  Orders Received/Actions taken: K replacement ordered

## 2017-05-16 NOTE — Clinical Social Work Note (Signed)
Clinical Social Work Assessment  Patient Details  Name: Beth Payne MRN: [id number] Date of Birth: 10/08/1940  Date of referral:  05/16/17               Reason for consult:  Facility Placement                Permission sought to share information with:  Family Supports, Oceanographeracility Contact Representative Permission granted to share information::     Name::        Agency::  [facility]  Relationship::  Best boyriend/HCPOA  Contact Information:   [name]  Housing/Transportation Living arrangements for the past 2 months:  [facility] Source of Information:  Patient Patient Interpreter Needed:  None Criminal Activity/Legal Involvement Pertinent to Current Situation/Hospitalization:  No - Comment as needed Significant Relationships:  Friend, Adult Children Lives with:  Self Do you feel safe going back to the place where you live?  Yes Need for family participation in patient care:  No (Coment)  Care giving concerns: Patient has medical history of DM type I and Dementia. She was admitted from SNF for Hypoglycemia.  No care giving concerns presented by patient or family. [facility] and will return at discharge.    Social Worker assessment / plan:  CSW discussed discharge plan to back to SNF. Patient reports she has been living there for a few months. She reports her family is involved in care but her friend Donna BernardRon Bruner is her HCPOA.  Patient CSW permission to update him about her care.  Facility will accept patient at d/c  Plan: Assist with discharge to SNF.   Employment status:  Retired Health and safety inspectornsurance information:  Medicare PT Recommendations:  Skilled Nursing Facility Information / Referral to community resources:  Skilled Nursing Facility  Patient/Family's Response to care:  Agreeable and Responding to care.   Patient/Family's Understanding of and Emotional Response to Diagnosis, Current Treatment, and Prognosis: Patient  has history of Dementia, she reports her children are involved in her care and [name] her friend is HCPOA.   Emotional Assessment Appearance:  Appears stated age Attitude/Demeanor/Rapport:    Affect (typically observed):  Accepting Orientation:  Oriented to Self, Oriented to Place, Oriented to Situation Alcohol / Substance use:  Not Applicable Psych involvement (Current and /or in the community):  No (Comment)  Discharge Needs  Concerns to be addressed:  Discharge Planning Concerns Readmission within the last 30 days:  No Current discharge risk:  Dependent with Mobility Barriers to Discharge:  No Barriers Identified   Clearance CootsNicole A Victorine Mcnee, LCSW 05/16/2017, 3:04 PM

## 2017-05-17 LAB — CBC
HCT: 32.6 % — ABNORMAL LOW (ref 36.0–46.0)
HEMOGLOBIN: 11 g/dL — AB (ref 12.0–15.0)
MCH: 27.4 pg (ref 26.0–34.0)
MCHC: 33.7 g/dL (ref 30.0–36.0)
MCV: 81.3 fL (ref 78.0–100.0)
PLATELETS: 178 10*3/uL (ref 150–400)
RBC: 4.01 MIL/uL (ref 3.87–5.11)
RDW: 13.9 % (ref 11.5–15.5)
WBC: 4.6 10*3/uL (ref 4.0–10.5)

## 2017-05-17 LAB — BASIC METABOLIC PANEL
ANION GAP: 7 (ref 5–15)
BUN: 7 mg/dL (ref 6–20)
CHLORIDE: 102 mmol/L (ref 101–111)
CO2: 26 mmol/L (ref 22–32)
Calcium: 9.4 mg/dL (ref 8.9–10.3)
Creatinine, Ser: 0.54 mg/dL (ref 0.44–1.00)
GFR calc Af Amer: >60 mL/min (ref >60)
Glucose, Bld: 254 mg/dL — ABNORMAL HIGH (ref 65–99)
POTASSIUM: 3.5 mmol/L (ref 3.5–5.1)
SODIUM: 135 mmol/L (ref 135–145)

## 2017-05-17 LAB — GLUCOSE, CAPILLARY
GLUCOSE-CAPILLARY: 327 mg/dL — AB (ref 65–99)
GLUCOSE-CAPILLARY: 264 mg/dL — AB (ref 65–99)
GLUCOSE-CAPILLARY: 169 mg/dL — AB (ref 65–99)
Glucose-Capillary: 168 mg/dL — ABNORMAL HIGH (ref 65–99)

## 2017-05-17 LAB — MAGNESIUM: MAGNESIUM: 2.1 mg/dL (ref 1.7–2.4)

## 2017-05-17 MED ORDER — LORAZEPAM 0.5 MG PO TABS: 0.5000 mg | ORAL_TABLET | Freq: Every day | ORAL | 0 refills | Status: DC

## 2017-05-17 NOTE — Discharge Instructions (Signed)

## 2017-05-17 NOTE — Care Management Important Message (Signed)
Important Message  Patient Details  Name: Beth Payne MRN: [id number] Date of Birth: 04/07/1941   Medicare Important Message Given:  Yes    Caren MacadamFuller, Barclay Lennox 05/17/2017, 1:39 PMImportant Message  Patient Details  Name: Beth Payne MRN: [id number] Date of Birth: 08/16/1940   Medicare Important Message Given:  Yes    Caren MacadamFuller, Frederich Montilla 05/17/2017, 1:39 PM

## 2017-05-17 NOTE — Discharge Summary (Signed)
Physician Discharge Summary  Beth Payne:[id number] DOB: May 25, 1941 DOA: 05/13/2017  PCP: Beth Prince, MD  Admit date: 05/13/2017 Discharge date: 05/17/2017  Recommendations for Outpatient Follow-up:  1. Pt will need to follow up with PCP in 2-3 weeks post discharge 2. Please obtain BMP to evaluate electrolytes and kidney function 3. Please also check CBC to evaluate Hg and Hct levels  Discharge Diagnoses:  Principal Problem:   DKA, type 1 (HCC) Active Problems:   Hypokalemia   Dementia   Prolonged Q-T interval on ECG   Nausea and vomiting   Leukocytosis   Discharge Condition: Stable  Diet recommendation: Heart healthy diet discussed in details   History of present illness:  76 y.o.femalewith medical history significant of brittle DM type I, dementia, TIA, MI, CAD, hypothyroidism, fibromyalgia, chronic pain, and GERD; who presented from nursing facility with elevated sugar levels. Review of records indicate that pt has been admitted 5 times in 2017 for DKA.   Assessment & Plan:   Principal Problem:   DKA, type 1 (HCC) - AG closed - has been transitioned to SSI and Insulin Levemir - so far stable, no hypoglycemic events  - if electrolytes stable in AM, pt can be discharge   Active Problems:   Hypokalemia - supplemented, will repeat electrolytes in AM     Dementia - stable     Prolonged Q-T interval on ECG - monitor on tele    Nausea and vomiting - resolved     Leukocytosis, LLL PNA - placed on Rocephin, will continue same regimen for now and stop after tomorrow's dose  - did not place on Zithromax due to prolonged QT - WBC is now WNL   DVT prophylaxis: Lovenox SQ Code Status: DNR Family Communication: Patient at bedside  Disposition Plan: SNF in AM   Consultants:   None  Procedures:   None  Antimicrobials:   Rocephin 12/08 --> 12/12  Procedures/Studies: Dg Chest 2 View  Result Date: 05/14/2017 CLINICAL DATA:  Nausea  and vomiting. EXAM: CHEST  2 VIEW COMPARISON:  February 02, 2016 FINDINGS: The heart, hila, and mediastinum are normal. No pneumothorax. Patchy opacity in the left base on the frontal view. No other interval change or acute abnormality. Previous vertebroplasty. IMPRESSION: Suggested mild opacity in the left base. Electronically Signed   By: Gerome Sam III M.D   On: 05/14/2017 03:59   Discharge Exam: Vitals:   05/17/17 0525 05/17/17 0844  BP: 126/75 122/82  Pulse: 68   Resp: 18   Temp: 98.3 F (36.8 C)   SpO2: 98%    Vitals:   05/16/17 2023 05/16/17 2048 05/17/17 0525 05/17/17 0844  BP:  115/64 126/75 122/82  Pulse:  83 68   Resp:  17 18   Temp: 97.8 F (36.6 C) 98.3 F (36.8 C) 98.3 F (36.8 C)   TempSrc: Oral Oral Oral   SpO2:  100% 98%   Weight:  46.2 kg (101 lb 13.6 oz)    Height:  5\' 2"  (1.575 m)      General: Pt is alert, follows commands appropriately, not in acute distress Cardiovascular: Regular rate and rhythm, S1/S2 +, no murmurs, no rubs, no gallops Respiratory: Clear to auscultation bilaterally, no wheezing, no crackles, no rhonchi Abdominal: Soft, non tender, non distended, bowel sounds +, no guarding  Discharge Instructions   Allergies as of 05/17/2017      Reactions   Diclofenac Sodium Swelling, Other (See Comments)   Blurred vision   Doxycycline  Calcium Swelling, Other (See Comments)   Blurred vision   Calcium-containing Compounds Other (See Comments)   Calcium [facility] as an allergy [facility]   Codeine Nausea And Vomiting, Other (See Comments)   Dizziness    Zocor [simvastatin] Other (See Comments)   Dizziness, weakness       Medication List    STOP taking these medications   feeding supplement (ENSURE ENLIVE) Liqd   feeding supplement (PRO-STAT SUGAR FREE 64) Liqd   traMADol 50 MG tablet Commonly known as:  ULTRAM     TAKE these medications   amLODipine 10 MG tablet Commonly known as:  NORVASC Take 1 tablet (10 mg total) by mouth  daily.   ARIPiprazole 5 MG tablet Commonly known as:  ABILIFY Take 1 tablet (5 mg total) by mouth every morning. What changed:  when to take this   bisacodyl 5 MG EC tablet Commonly known as:  DULCOLAX Take 10 mg by mouth daily as needed for mild constipation or moderate constipation.   cholecalciferol 1000 units tablet Commonly known as:  VITAMIN D Take 2,000 Units by mouth daily.   donepezil 10 MG tablet Commonly known as:  ARICEPT Take 10 mg by mouth at bedtime.   ferrous sulfate 325 (65 FE) MG tablet Take 325 mg by mouth 2 (two) times daily.   GLUCAGON EMERGENCY 1 MG injection Generic drug:  glucagon Inject 1 mg into the vein once as needed (low blood sugar).   insulin aspart 100 UNIT/ML injection Commonly known as:  novoLOG Inject at bedtime based upon sugars.  Sugar 0-120--no units; 121-150--1 unit;  151-200--2 units;  201-250--3 units; 251-300--5 units;  301-350--7 units;  351-400--9 units What changed:    how much to take  how to take this  when to take this  additional instructions   insulin detemir 100 UNIT/ML injection Commonly known as:  LEVEMIR Inject 0.1 mLs (10 Units total) into the skin 2 (two) times daily. What changed:  how much to take   insulin lispro 100 UNIT/ML injection Commonly known as:  HUMALOG Inject 3 Units into the skin 3 (three) times daily.   lamoTRIgine 25 MG tablet Commonly known as:  LAMICTAL Take 100 mg by mouth at bedtime.   levothyroxine 50 MCG tablet Commonly known as:  SYNTHROID, LEVOTHROID Take 50 mcg by mouth daily before breakfast.   LORazepam 0.5 MG tablet Commonly known as:  ATIVAN Take 1 tablet (0.5 mg total) by mouth at bedtime. Anxiety What changed:    how much to take  additional instructions   magnesium hydroxide 400 MG/5ML suspension Commonly known as:  MILK OF MAGNESIA Take 30 mLs by mouth daily as needed for mild constipation.   metoCLOPramide 5 MG tablet Commonly known as:  REGLAN Take 1 tablet  (5 mg total) by mouth 3 (three) times daily before meals. What changed:  additional instructions   ondansetron 4 MG tablet Commonly known as:  ZOFRAN Take 4 mg by mouth every 6 (six) hours as needed for nausea or vomiting.   pantoprazole 40 MG tablet Commonly known as:  PROTONIX Take 1 tablet (40 mg total) by mouth 2 (two) times daily.   polyethylene glycol packet Commonly known as:  MIRALAX / GLYCOLAX Take 17 g by mouth daily. What changed:  additional instructions   prazosin 1 MG capsule Commonly known as:  MINIPRESS Take 1 mg by mouth at bedtime.   rosuvastatin 5 MG tablet Commonly known as:  CRESTOR Take 5 mg by mouth daily  at 6 PM.   sertraline 50 MG tablet Commonly known as:  ZOLOFT Take 50 mg by mouth 2 (two) times daily.        Follow-up Information    South, Stephen, MD Follow up.   Specialty:  Endocrinology Contact information: [address] [place] [address] [phone]            The results of significant diagnostics from this hospitalization (including imaging, microbiology, ancillary and laboratory) are listed below for reference.     Microbiology: Recent Results (from the past 240 hour(s))  MRSA PCR Screening     Status: None   Collection Time: 05/14/17  7:50 AM  Result Value Ref Range Status   MRSA by PCR NEGATIVE NEGATIVE Final    Comment:        The GeneXpert MRSA Assay (FDA approved for NASAL specimens only), is one component of a comprehensive MRSA colonization surveillance program. It is not intended to diagnose MRSA infection nor to guide or monitor treatment for MRSA infections.      Labs: Basic Metabolic Panel: Recent Labs  Lab 05/14/17 0710  05/14/17 2338 05/15/17 0255 05/15/17 0651 05/16/17 0321 05/17/17 0458  NA 143   < > 140 141 137 133* 135  K 2.9*   < > 3.2* 3.1* 3.2* 2.7* 3.5  CL 110   < > 107 108 104 100* 102  CO2 18*   < > 19* 22 20* 22 26  GLUCOSE 129*   < > 167* 121* 165* 184* 254*  BUN 14    < > 9 7 6  <5* 7  CREATININE 0.74   < > 0.62 0.60 0.60 0.42* 0.54  CALCIUM 8.8*   < > 9.2 9.1 9.1 9.0 9.4  MG 1.7  --   --   --   --  1.5* 2.1   < > = values in this interval not displayed.   CBC: Recent Labs  Lab 05/13/17 2356 05/14/17 0035 05/14/17 0710 05/16/17 0321 05/17/17 0458  WBC 13.2*  --  12.2* 6.4 4.6  NEUTROABS 12.3*  --  11.0*  --   --   HGB 11.6* 12.6 10.3* 11.5* 11.0*  HCT 33.7* 37.0 29.8* 33.5* 32.6*  MCV 82.6  --  82.3 81.9 81.3  PLT 254  --  209 191 178    CBG: Recent Labs  Lab 05/16/17 1204 05/16/17 1612 05/16/17 2131 05/17/17 0808 05/17/17 1221  GLUCAP 297* 74 183* 327* 264*    SIGNED: Time coordinating discharge: 60 minutes  Debbora PrestoIskra Magick-Akilah Cureton, MD  [facility] 05/17/2017, 2:15 PM Pager [phone]  If 7PM-7AM, please contact night-coverage [email or web address] Password [email or web address]

## 2017-05-17 NOTE — Progress Notes (Signed)
PT Cancellation Note  Patient Details Name: Beth Payne MRN: [id number] DOB: 12/22/1940   Cancelled Treatment:    Reason Eval/Treat Not Completed: Pt refuses participation with PT. Pt is a long term SNF resident. Plan is to return to SNF at d/c. Will sign off.    [name], MPT Pager: [phone]

## 2017-05-18 DIAGNOSIS — K3184 Gastroparesis: Secondary | ICD-10-CM | POA: Diagnosis not present

## 2017-05-18 DIAGNOSIS — E559 Vitamin D deficiency, unspecified: Secondary | ICD-10-CM | POA: Diagnosis not present

## 2017-05-18 DIAGNOSIS — E7800 Pure hypercholesterolemia, unspecified: Secondary | ICD-10-CM | POA: Diagnosis not present

## 2017-05-18 DIAGNOSIS — D649 Anemia, unspecified: Secondary | ICD-10-CM | POA: Diagnosis not present

## 2017-05-18 DIAGNOSIS — N179 Acute kidney failure, unspecified: Secondary | ICD-10-CM | POA: Diagnosis not present

## 2017-05-18 DIAGNOSIS — E1365 Other specified diabetes mellitus with hyperglycemia: Secondary | ICD-10-CM | POA: Diagnosis not present

## 2017-05-18 DIAGNOSIS — R2689 Other abnormalities of gait and mobility: Secondary | ICD-10-CM | POA: Diagnosis not present

## 2017-05-18 DIAGNOSIS — R4182 Altered mental status, unspecified: Secondary | ICD-10-CM | POA: Diagnosis not present

## 2017-05-18 DIAGNOSIS — F0390 Unspecified dementia without behavioral disturbance: Secondary | ICD-10-CM | POA: Diagnosis not present

## 2017-05-18 DIAGNOSIS — F411 Generalized anxiety disorder: Secondary | ICD-10-CM | POA: Diagnosis not present

## 2017-05-18 DIAGNOSIS — E785 Hyperlipidemia, unspecified: Secondary | ICD-10-CM | POA: Diagnosis not present

## 2017-05-18 DIAGNOSIS — J189 Pneumonia, unspecified organism: Secondary | ICD-10-CM | POA: Diagnosis not present

## 2017-05-18 DIAGNOSIS — E1065 Type 1 diabetes mellitus with hyperglycemia: Secondary | ICD-10-CM | POA: Diagnosis not present

## 2017-05-18 DIAGNOSIS — E1010 Type 1 diabetes mellitus with ketoacidosis without coma: Secondary | ICD-10-CM | POA: Diagnosis not present

## 2017-05-18 DIAGNOSIS — H401131 Primary open-angle glaucoma, bilateral, mild stage: Secondary | ICD-10-CM | POA: Diagnosis not present

## 2017-05-18 DIAGNOSIS — H04123 Dry eye syndrome of bilateral lacrimal glands: Secondary | ICD-10-CM | POA: Diagnosis not present

## 2017-05-18 DIAGNOSIS — I10 Essential (primary) hypertension: Secondary | ICD-10-CM | POA: Diagnosis not present

## 2017-05-18 DIAGNOSIS — K219 Gastro-esophageal reflux disease without esophagitis: Secondary | ICD-10-CM | POA: Diagnosis not present

## 2017-05-18 DIAGNOSIS — E1310 Other specified diabetes mellitus with ketoacidosis without coma: Secondary | ICD-10-CM | POA: Diagnosis not present

## 2017-05-18 DIAGNOSIS — M797 Fibromyalgia: Secondary | ICD-10-CM | POA: Diagnosis not present

## 2017-05-18 DIAGNOSIS — E039 Hypothyroidism, unspecified: Secondary | ICD-10-CM | POA: Diagnosis not present

## 2017-05-18 DIAGNOSIS — L89152 Pressure ulcer of sacral region, stage 2: Secondary | ICD-10-CM | POA: Diagnosis not present

## 2017-05-18 DIAGNOSIS — Z79899 Other long term (current) drug therapy: Secondary | ICD-10-CM | POA: Diagnosis not present

## 2017-05-18 DIAGNOSIS — E119 Type 2 diabetes mellitus without complications: Secondary | ICD-10-CM | POA: Diagnosis not present

## 2017-05-18 DIAGNOSIS — M6281 Muscle weakness (generalized): Secondary | ICD-10-CM | POA: Diagnosis not present

## 2017-05-18 DIAGNOSIS — R41841 Cognitive communication deficit: Secondary | ICD-10-CM | POA: Diagnosis not present

## 2017-05-18 DIAGNOSIS — Z961 Presence of intraocular lens: Secondary | ICD-10-CM | POA: Diagnosis not present

## 2017-05-18 DIAGNOSIS — R278 Other lack of coordination: Secondary | ICD-10-CM | POA: Diagnosis not present

## 2017-05-18 DIAGNOSIS — F331 Major depressive disorder, recurrent, moderate: Secondary | ICD-10-CM | POA: Diagnosis not present

## 2017-05-18 LAB — BASIC METABOLIC PANEL
Anion gap: 8 (ref 5–15)
BUN: 8 mg/dL (ref 6–20)
CHLORIDE: 102 mmol/L (ref 101–111)
CO2: 24 mmol/L (ref 22–32)
CREATININE: 0.57 mg/dL (ref 0.44–1.00)
Calcium: 9.2 mg/dL (ref 8.9–10.3)
GFR calc Af Amer: >60 mL/min (ref >60)
GFR calc non Af Amer: >60 mL/min (ref >60)
GLUCOSE: 199 mg/dL — AB (ref 65–99)
POTASSIUM: 3.4 mmol/L — AB (ref 3.5–5.1)
SODIUM: 134 mmol/L — AB (ref 135–145)

## 2017-05-18 LAB — CBC
HEMATOCRIT: 29.6 % — AB (ref 36.0–46.0)
HEMOGLOBIN: 10.2 g/dL — AB (ref 12.0–15.0)
MCH: 28.3 pg (ref 26.0–34.0)
MCHC: 34.5 g/dL (ref 30.0–36.0)
MCV: 82 fL (ref 78.0–100.0)
Platelets: 151 10*3/uL (ref 150–400)
RBC: 3.61 MIL/uL — ABNORMAL LOW (ref 3.87–5.11)
RDW: 14 % (ref 11.5–15.5)
WBC: 3.7 10*3/uL — ABNORMAL LOW (ref 4.0–10.5)

## 2017-05-18 LAB — GLUCOSE, CAPILLARY
GLUCOSE-CAPILLARY: 355 mg/dL — AB (ref 65–99)
Glucose-Capillary: 202 mg/dL — ABNORMAL HIGH (ref 65–99)

## 2017-05-18 LAB — MAGNESIUM: MAGNESIUM: 1.7 mg/dL (ref 1.7–2.4)

## 2017-05-18 MED ORDER — POTASSIUM CHLORIDE CRYS ER 20 MEQ PO TBCR
40.0000 meq | EXTENDED_RELEASE_TABLET | Freq: Once | ORAL | Status: AC
  Administered 2017-05-18: 40 meq via ORAL
  Filled 2017-05-18: qty 2

## 2017-05-18 MED ORDER — INSULIN DETEMIR 100 UNIT/ML ~~LOC~~ SOLN: 10.0000 [IU] | Freq: Two times a day (BID) | SUBCUTANEOUS | 0 refills | Status: DC

## 2017-05-18 NOTE — Progress Notes (Signed)
Pt returning to [facility]- Report [phone].  Pt's POA [name] notified- completed paperwork for readmission with facility.  Arranged [facility] transportation.  Ilean SkillMeghan Nica Friske, MSW, LCSW Clinical Social Work 05/18/2017 [phone]

## 2017-05-18 NOTE — Discharge Planning (Addendum)
[facility]  Yet, no success.  Still attempting to urinate after foley was DCd.  Staff will continue to assess.

## 2017-05-18 NOTE — Discharge Planning (Addendum)
Dr. Geradine Girtontacted to assess if foley could be removed for discharge. Orders given to remove for voiding trial. 275cc in bag when foley DCd

## 2017-05-18 NOTE — Discharge Planning (Signed)
Patients IV and tele removed.  RN assessment and VS revealed stability for DC back to SNIF.  Report called to [facility] s/w Leafy Kindlehantea Brown, LPN. [facility] this AM.  Informed of suggested FU appt and appt made.  Scripts signed and included in facility packet.  [facility] DNR and AVS included in packet.  EMS contacted to transport back to facility and family notified of d/c back to [facility].

## 2017-05-18 NOTE — NC FL2 (Signed)
[place] MEDICAID FL2 LEVEL OF CARE SCREENING TOOL     IDENTIFICATION  Patient Name: Beth Payne Birthdate: 06/08/1940 Sex: female Admission Date (Current Location): 05/13/2017  [facility]   Facility and Address:  [facility],  [address], [place] [address]      Provider Number: [id number]  Attending Physician Name and Address:  Dorothea OgleMyers, Iskra M, MD  Relative Name and Phone Number:       Current Level of Care: Hospital Recommended Level of Care: Skilled Nursing Facility Prior Approval Number:    Date Approved/Denied:   PASRR Number: [id number]  Discharge Plan: SNF    Current Diagnoses: Patient Active Problem List   Diagnosis Date Noted  . Prolonged Q-T interval on ECG 05/14/2017  . Nausea and vomiting 05/14/2017  . Leukocytosis 05/14/2017  . DKA (diabetic ketoacidoses) (HCC) 02/02/2016  . DKA, type 1 (HCC) 02/02/2016  . Chronic pain syndrome   . Depression   . Anxiety state   . Other specified hypothyroidism   . Diabetic gastroparesis (HCC) 01/19/2016  . Transaminasemia   . Transaminitis 12/13/2015  . Hypomagnesemia 11/24/2015  . Protein-calorie malnutrition, severe 11/22/2015  . Pressure ulcer, stage I, non-blanching erythema 11/22/2015  . 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 Dementia 11/20/2015  . Abdominal pain 11/20/2015  . Weakness generalized 05/20/2015  . Lung mass 05/20/2015  . Hypokalemia 04/24/2015  . Acute encephalopathy 01/01/2015  . Type 1 diabetes mellitus with peripheral circulatory complications (HCC) 12/17/2014  . CAD in native artery   . Pulmonary hypertension (HCC)   . GERD (gastroesophageal reflux disease) 12/12/2014  . CAD s/p stent to mid-RCA in 2001. 12/12/2014  . Osteoarthritis 12/12/2014  . Systolic murmur 12/12/2014  . Normocytic anemia 12/12/2014  . Dyslipidemia 12/12/2014    Orientation RESPIRATION BLADDER Height & Weight     Self, Time, Situation, Place  Normal Continent Weight:  101 lb 13.6 oz (46.2 kg) Height:  5\' 2"  (157.5 cm)  BEHAVIORAL SYMPTOMS/MOOD NEUROLOGICAL BOWEL NUTRITION STATUS      Continent Diet(low sodium heart healthy)  AMBULATORY STATUS COMMUNICATION OF NEEDS Skin   Limited Assist Verbally Normal                       Personal Care Assistance Level of Assistance  Bathing, Feeding, Dressing Bathing Assistance: Limited assistance Feeding assistance: Independent Dressing Assistance: Limited assistance     Functional Limitations Info  Sight, Hearing, Speech Sight Info: Adequate Hearing Info: Adequate Speech Info: Adequate    SPECIAL CARE FACTORS FREQUENCY                       Contractures Contractures Info: Not present    Additional Factors Info  Code Status, Allergies Code Status Info: DNR Allergies Info: Diclofenac Sodium, Doxycycline Calcium, Calcium-containing Compounds, Codeine, Zocor Simvastatin           Current Medications (05/18/2017):  This is the current hospital active medication list Current Facility-Administered Medications  Medication Dose Route Frequency Provider Last Rate Last Dose  . acetaminophen (TYLENOL) tablet 650 mg  650 mg Oral Q6H PRN Madelyn FlavorsSmith, Rondell A, MD       Or  . acetaminophen (TYLENOL) suppository 650 mg  650 mg Rectal Q6H PRN Madelyn FlavorsSmith, Rondell A, MD      . amLODipine (NORVASC) tablet 10 mg  10 mg Oral Daily Dorothea OgleMyers, Iskra M, MD   10 mg at 05/18/17 [phone]  . cefTRIAXone (ROCEPHIN) 1 g  in dextrose 5 % 50 mL IVPB  1 g Intravenous Q24H Rollene FareWilliamson, Erin R, [place]   Stopped at 05/18/17 [id number]  . enoxaparin (LOVENOX) injection 40 mg  40 mg Subcutaneous Q24H Smith, Rondell A, MD   40 mg at 05/18/17 0811  . hydrALAZINE (APRESOLINE) injection 5 mg  5 mg Intravenous Q6H PRN Audrea MuscatBlount, Xenia T, NP   5 mg at 05/16/17 0142  . insulin aspart (novoLOG) injection 0-9 Units  0-9 Units Subcutaneous TID WC Dorothea OgleMyers, Iskra M, MD   9 Units at 05/18/17 0813  . insulin detemir (LEVEMIR) injection 10 Units  10 Units Subcutaneous  Daily Dorothea OgleMyers, Iskra M, MD   10 Units at 05/18/17 [phone]  . ipratropium-albuterol (DUONEB) 0.5-2.5 (3) MG/3ML nebulizer solution 3 mL  3 mL Nebulization Q6H PRN Katrinka BlazingSmith, Rondell A, MD      . lamoTRIgine (LAMICTAL) tablet 100 mg  100 mg Oral QHS Dorothea OgleMyers, Iskra M, MD   100 mg at 05/17/17 2101  . levothyroxine (SYNTHROID, LEVOTHROID) tablet 50 mcg  50 mcg Oral QAC breakfast Dorothea OgleMyers, Iskra M, MD   50 mcg at 05/18/17 [phone]  . magnesium hydroxide (MILK OF MAGNESIA) suspension 30 mL  30 mL Oral Daily PRN Dorothea OgleMyers, Iskra M, MD      . pantoprazole (PROTONIX) EC tablet 40 mg  40 mg Oral BID Dorothea OgleMyers, Iskra M, MD   40 mg at 05/18/17 [phone]     Discharge Medications: TAKE these medications   amLODipine 10 MG tablet Commonly known as:  NORVASC Take 1 tablet (10 mg total) by mouth daily.   ARIPiprazole 5 MG tablet Commonly known as:  ABILIFY Take 1 tablet (5 mg total) by mouth every morning. What changed:  when to take this   bisacodyl 5 MG EC tablet Commonly known as:  DULCOLAX Take 10 mg by mouth daily as needed for mild constipation or moderate constipation.   cholecalciferol 1000 units tablet Commonly known as:  VITAMIN D Take 2,000 Units by mouth daily.   donepezil 10 MG tablet Commonly known as:  ARICEPT Take 10 mg by mouth at bedtime.   ferrous sulfate 325 (65 FE) MG tablet Take 325 mg by mouth 2 (two) times daily.   GLUCAGON EMERGENCY 1 MG injection Generic drug:  glucagon Inject 1 mg into the vein once as needed (low blood sugar).   insulin aspart 100 UNIT/ML injection Commonly known as:  novoLOG Inject at bedtime based upon sugars.  Sugar 0-120--no units; 121-150--1 unit;  151-200--2 units;  201-250--3 units; 251-300--5 units;  301-350--7 units;  351-400--9 units What changed:    how much to take  how to take this  when to take this  additional instructions   insulin detemir 100 UNIT/ML injection Commonly known as:  LEVEMIR Inject 0.1 mLs (10 Units total) into the skin 2 (two)  times daily. What changed:  how much to take   insulin lispro 100 UNIT/ML injection Commonly known as:  HUMALOG Inject 3 Units into the skin 3 (three) times daily.   lamoTRIgine 25 MG tablet Commonly known as:  LAMICTAL Take 100 mg by mouth at bedtime.   levothyroxine 50 MCG tablet Commonly known as:  SYNTHROID, LEVOTHROID Take 50 mcg by mouth daily before breakfast.   LORazepam 0.5 MG tablet Commonly known as:  ATIVAN Take 1 tablet (0.5 mg total) by mouth at bedtime. Anxiety What changed:    how much to take  additional instructions   magnesium hydroxide 400 MG/5ML suspension Commonly known as:  MILK OF  MAGNESIA Take 30 mLs by mouth daily as needed for mild constipation.   metoCLOPramide 5 MG tablet Commonly known as:  REGLAN Take 1 tablet (5 mg total) by mouth 3 (three) times daily before meals. What changed:  additional instructions   ondansetron 4 MG tablet Commonly known as:  ZOFRAN Take 4 mg by mouth every 6 (six) hours as needed for nausea or vomiting.   pantoprazole 40 MG tablet Commonly known as:  PROTONIX Take 1 tablet (40 mg total) by mouth 2 (two) times daily.   polyethylene glycol packet Commonly known as:  MIRALAX / GLYCOLAX Take 17 g by mouth daily. What changed:  additional instructions   prazosin 1 MG capsule Commonly known as:  MINIPRESS Take 1 mg by mouth at bedtime.   rosuvastatin 5 MG tablet Commonly known as:  CRESTOR Take 5 mg by mouth daily at 6 PM.   sertraline 50 MG tablet Commonly known as:  ZOLOFT Take 50 mg by mouth 2 (two) times daily.    Relevant Imaging Results:  Relevant Lab Results:   Additional Information Allergies: Diclofenac Sodium, Doxycycline Calcium, Calcium-containing Compounds, Codeine, Zocor Simvastatin  SS# [phone]    Vihan Santagata R Alley Neils, LCSW

## 2017-05-18 NOTE — Discharge Summary (Signed)
Physician Discharge Summary  Beth Payne:[id number] DOB: 03-21-1941 DOA: 05/13/2017  PCP: [name], MD  Admit date: 05/13/2017 Discharge date: 05/18/2017  Recommendations for Outpatient Follow-up:  1. Pt will need to follow up with PCP in 1-2 weeks post discharge 2. Please obtain BMP to evaluate electrolytes and kidney function, K level  3. Please also check CBC to evaluate Hg and Hct levels  Discharge Diagnoses:  Principal Problem:   DKA, type 1 (HCC) Active Problems:   Hypokalemia   Dementia   Prolonged Q-T interval on ECG   Nausea and vomiting   Leukocytosis  Discharge Condition: Stable  Diet recommendation: Heart healthy diet discussed in details   History of present illness:  76 y.o.femalewith medical history significant of brittle DM type I, dementia, TIA, MI, CAD, hypothyroidism, fibromyalgia, chronic pain, and GERD; who presented from nursing facility with elevated sugar levels. Review of records indicate that pt has been admitted 5 times in 2017 for DKA.   Assessment & Plan:   Principal Problem:   DKA, type 1 (HCC) - AG closed - has been transitioned to SSI and Insulin Levemir - stable overnight, electrolytes stable   Active Problems:   Hypokalemia - supplemented prior to discharge     Dementia - stable     Prolonged Q-T interval on ECG - avoid QT prolonging medications     Nausea and vomiting - resolved     Leukocytosis, LLL PNA - placed on Rocephin, will stop after today's dose, this will complete therapy 5 days total  - did not place on Zithromax due to prolonged QT - WBC is now WNL   DVT prophylaxis: Lovenox SQ Code Status: DNR Family Communication: Patient at bedside  Disposition Plan: SNF   Consultants:   None  Procedures:   None  Antimicrobials:   Rocephin 12/08 --> 12/12  Procedures/Studies: Dg Chest 2 View  Result Date: 05/14/2017 CLINICAL DATA:  Nausea and vomiting. EXAM: CHEST  2 VIEW COMPARISON:   February 02, 2016 FINDINGS: The heart, hila, and mediastinum are normal. No pneumothorax. Patchy opacity in the left base on the frontal view. No other interval change or acute abnormality. Previous vertebroplasty. IMPRESSION: Suggested mild opacity in the left base. Electronically Signed   By: Gerome Sam III M.D   On: 05/14/2017 03:59   Discharge Exam: Vitals:   05/18/17 0646 05/18/17 0811  BP: (!) 121/57 126/62  Pulse: 76   Resp: 16   Temp: 98.4 F (36.9 C)   SpO2: 98%    Vitals:   05/17/17 1609 05/17/17 2103 05/18/17 0646 05/18/17 0811  BP: 104/62 120/60 (!) 121/57 126/62  Pulse: 73 80 76   Resp: 14 16 16    Temp: 98.9 F (37.2 C) 98.4 F (36.9 C) 98.4 F (36.9 C)   TempSrc: Oral Oral Oral   SpO2: 98% 100% 98%   Weight:      Height:        Physical Exam  Constitutional: Appears calm, NAD CVS: RRR, S1/S2 +, no murmurs, no gallops, no carotid bruit.  Pulmonary: Effort and breath sounds normal, no stridor, rhonchi, wheezes, rales.  Abdominal: Soft. BS +,  no distension, tenderness, rebound or guarding.  Musculoskeletal: Normal range of motion. No edema and no tenderness.   Discharge Instructions   Allergies as of 05/18/2017      Reactions   Diclofenac Sodium Swelling, Other (See Comments)   Blurred vision   Doxycycline Calcium Swelling, Other (See Comments)   Blurred vision  Calcium-containing Compounds Other (See Comments)   Calcium listed as an allergy on [facility] 08/10/15   Codeine Nausea And Vomiting, Other (See Comments)   Dizziness    Zocor [simvastatin] Other (See Comments)   Dizziness, weakness       Medication List    STOP taking these medications   feeding supplement (ENSURE ENLIVE) Liqd   feeding supplement (PRO-STAT SUGAR FREE 64) Liqd   traMADol 50 MG tablet Commonly known as:  ULTRAM     TAKE these medications   amLODipine 10 MG tablet Commonly known as:  NORVASC Take 1 tablet (10 mg total) by mouth daily.   ARIPiprazole 5 MG  tablet Commonly known as:  ABILIFY Take 1 tablet (5 mg total) by mouth every morning. What changed:  when to take this   bisacodyl 5 MG EC tablet Commonly known as:  DULCOLAX Take 10 mg by mouth daily as needed for mild constipation or moderate constipation.   cholecalciferol 1000 units tablet Commonly known as:  VITAMIN D Take 2,000 Units by mouth daily.   donepezil 10 MG tablet Commonly known as:  ARICEPT Take 10 mg by mouth at bedtime.   ferrous sulfate 325 (65 FE) MG tablet Take 325 mg by mouth 2 (two) times daily.   GLUCAGON EMERGENCY 1 MG injection Generic drug:  glucagon Inject 1 mg into the vein once as needed (low blood sugar).   insulin aspart 100 UNIT/ML injection Commonly known as:  novoLOG Inject at bedtime based upon sugars.  Sugar 0-120--no units; 121-150--1 unit;  151-200--2 units;  201-250--3 units; 251-300--5 units;  301-350--7 units;  351-400--9 units What changed:    how much to take  how to take this  when to take this  additional instructions   insulin detemir 100 UNIT/ML injection Commonly known as:  LEVEMIR Inject 0.1 mLs (10 Units total) into the skin 2 (two) times daily. What changed:  how much to take   insulin lispro 100 UNIT/ML injection Commonly known as:  HUMALOG Inject 3 Units into the skin 3 (three) times daily.   lamoTRIgine 25 MG tablet Commonly known as:  LAMICTAL Take 100 mg by mouth at bedtime.   levothyroxine 50 MCG tablet Commonly known as:  SYNTHROID, LEVOTHROID Take 50 mcg by mouth daily before breakfast.   LORazepam 0.5 MG tablet Commonly known as:  ATIVAN Take 1 tablet (0.5 mg total) by mouth at bedtime. Anxiety What changed:    how much to take  additional instructions   magnesium hydroxide 400 MG/5ML suspension Commonly known as:  MILK OF MAGNESIA Take 30 mLs by mouth daily as needed for mild constipation.   metoCLOPramide 5 MG tablet Commonly known as:  REGLAN Take 1 tablet (5 mg total) by mouth 3  (three) times daily before meals. What changed:  additional instructions   ondansetron 4 MG tablet Commonly known as:  ZOFRAN Take 4 mg by mouth every 6 (six) hours as needed for nausea or vomiting.   pantoprazole 40 MG tablet Commonly known as:  PROTONIX Take 1 tablet (40 mg total) by mouth 2 (two) times daily.   polyethylene glycol packet Commonly known as:  MIRALAX / GLYCOLAX Take 17 g by mouth daily. What changed:  additional instructions   prazosin 1 MG capsule Commonly known as:  MINIPRESS Take 1 mg by mouth at bedtime.   rosuvastatin 5 MG tablet Commonly known as:  CRESTOR Take 5 mg by mouth daily at 6 PM.   sertraline 50 MG tablet Commonly known  as:  ZOLOFT Take 50 mg by mouth 2 (two) times daily.           Follow-up Information    Adrian PrinceSouth, Stephen, MD Follow up.   Specialty:  Endocrinology Contact information: [address] [place] [address] [phone]            The results of significant diagnostics from this hospitalization (including imaging, microbiology, ancillary and laboratory) are listed below for reference.     Microbiology: Recent Results (from the past 240 hour(s))  MRSA PCR Screening     Status: None   Collection Time: 05/14/17  7:50 AM  Result Value Ref Range Status   MRSA by PCR NEGATIVE NEGATIVE Final    Comment:        The GeneXpert MRSA Assay (FDA approved for NASAL specimens only), is one component of a comprehensive MRSA colonization surveillance program. It is not intended to diagnose MRSA infection nor to guide or monitor treatment for MRSA infections.      Labs: Basic Metabolic Panel: Recent Labs  Lab 05/14/17 0710  05/15/17 0255 05/15/17 0651 05/16/17 0321 05/17/17 0458 05/18/17 0430  NA 143   < > 141 137 133* 135 134*  K 2.9*   < > 3.1* 3.2* 2.7* 3.5 3.4*  CL 110   < > 108 104 100* 102 102  CO2 18*   < > 22 20* 22 26 24   GLUCOSE 129*   < > 121* 165* 184* 254* 199*  BUN 14   < > 7 6 <5* 7 8   CREATININE 0.74   < > 0.60 0.60 0.42* 0.54 0.57  CALCIUM 8.8*   < > 9.1 9.1 9.0 9.4 9.2  MG 1.7  --   --   --  1.5* 2.1 1.7   < > = values in this interval not displayed.   CBC: Recent Labs  Lab 05/13/17 2356 05/14/17 0035 05/14/17 0710 05/16/17 0321 05/17/17 0458 05/18/17 0430  WBC 13.2*  --  12.2* 6.4 4.6 3.7*  NEUTROABS 12.3*  --  11.0*  --   --   --   HGB 11.6* 12.6 10.3* 11.5* 11.0* 10.2*  HCT 33.7* 37.0 29.8* 33.5* 32.6* 29.6*  MCV 82.6  --  82.3 81.9 81.3 82.0  PLT 254  --  209 191 178 151    CBG: Recent Labs  Lab 05/17/17 0808 05/17/17 1221 05/17/17 1608 05/17/17 2103 05/18/17 0756  GLUCAP 327* 264* 169* 168* 355*    SIGNED: Time coordinating discharge: 60 minutes  Debbora PrestoIskra Magick-Reid Regas, MD  [facility] 05/18/2017, 8:28 AM Pager [phone]  If 7PM-7AM, please contact night-coverage [email or web address] Password [email or web address]
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
## 2017-05-18 NOTE — Care Management Note (Signed)
Case Management Note  Patient Details  Name: Beth Payne MRN: [id number] Date of Birth: 04/14/1941  Subjective/Objective: No CM needs.                   Action/Plan:d/c home.   Expected Discharge Date:  05/18/17               Expected Discharge Plan:  Home/Self Care  In-House Referral:     Discharge planning Services  CM Consult  Post Acute Care Choice:    Choice offered to:     DME Arranged:    DME Agency:     HH Arranged:    HH Agency:     Status of Service:  Completed, signed off  If discussed at [employer] Length of Stay Meetings, dates discussed:    Additional Comments:  Lanier ClamMahabir, Sana Tessmer, RN 05/18/2017, 9:42 AM

## 2017-05-18 NOTE — Care Management Note (Signed)
Case Management Note  Patient Details  Name: [name] MRN: [id number] Date of Birth: 02/04/1941  Subjective/Objective: dc SNF-CSW managing.                   Action/Plan:d/c SNF.   Expected Discharge Date:  05/18/17               Expected Discharge Plan:  Skilled Nursing Facility  In-House Referral:  Clinical Social Work  Discharge planning Services  CM Consult  Post Acute Care Choice:    Choice offered to:     DME Arranged:    DME Agency:     HH Arranged:    HH Agency:     Status of Service:  Completed, signed off  If discussed at [employer] Length of [employer] Meetings, dates discussed:    Additional Comments:  Lanier ClamMahabir, Hani Patnode, RN 05/18/2017, 12:15 PM

## 2017-05-19 DIAGNOSIS — I10 Essential (primary) hypertension: Secondary | ICD-10-CM | POA: Diagnosis not present

## 2017-05-19 DIAGNOSIS — K219 Gastro-esophageal reflux disease without esophagitis: Secondary | ICD-10-CM | POA: Diagnosis not present

## 2017-05-19 DIAGNOSIS — J189 Pneumonia, unspecified organism: Secondary | ICD-10-CM | POA: Diagnosis not present

## 2017-05-19 DIAGNOSIS — E1010 Type 1 diabetes mellitus with ketoacidosis without coma: Secondary | ICD-10-CM | POA: Diagnosis not present

## 2017-05-25 ENCOUNTER — Other Ambulatory Visit: Payer: Self-pay | Admitting: *Deleted

## 2017-05-25 DIAGNOSIS — M797 Fibromyalgia: Secondary | ICD-10-CM | POA: Diagnosis not present

## 2017-05-25 DIAGNOSIS — F0390 Unspecified dementia without behavioral disturbance: Secondary | ICD-10-CM | POA: Diagnosis not present

## 2017-05-25 DIAGNOSIS — I10 Essential (primary) hypertension: Secondary | ICD-10-CM | POA: Diagnosis not present

## 2017-05-25 DIAGNOSIS — E1310 Other specified diabetes mellitus with ketoacidosis without coma: Secondary | ICD-10-CM | POA: Diagnosis not present

## 2017-05-25 DIAGNOSIS — E039 Hypothyroidism, unspecified: Secondary | ICD-10-CM | POA: Diagnosis not present

## 2017-05-25 DIAGNOSIS — E7800 Pure hypercholesterolemia, unspecified: Secondary | ICD-10-CM | POA: Diagnosis not present

## 2017-05-25 NOTE — Patient Outreach (Signed)
    [facility]) Care Management Post-Acute Care Coordination  05/25/2017  Beth Payne 25-Nov-1940 [id number]   Met with Beth Payne, SW for Beth Payne. Reviewed patient case. She confirms that patient is a [place] resident of facility and there are no plans to discharge at this time.  RNCM will sign off case.  Beth Payne. Laymond Purser, RN, BSN, [place] Post-Acute Care Coordinator [phone]

## 2017-05-27 DIAGNOSIS — H401131 Primary open-angle glaucoma, bilateral, mild stage: Secondary | ICD-10-CM | POA: Diagnosis not present

## 2017-05-27 DIAGNOSIS — H04123 Dry eye syndrome of bilateral lacrimal glands: Secondary | ICD-10-CM | POA: Diagnosis not present

## 2017-05-27 DIAGNOSIS — Z961 Presence of intraocular lens: Secondary | ICD-10-CM | POA: Diagnosis not present

## 2017-05-27 DIAGNOSIS — E119 Type 2 diabetes mellitus without complications: Secondary | ICD-10-CM | POA: Diagnosis not present

## 2017-06-07 DIAGNOSIS — R2689 Other abnormalities of gait and mobility: Secondary | ICD-10-CM | POA: Diagnosis not present

## 2017-06-07 DIAGNOSIS — N179 Acute kidney failure, unspecified: Secondary | ICD-10-CM | POA: Diagnosis not present

## 2017-06-07 DIAGNOSIS — M6281 Muscle weakness (generalized): Secondary | ICD-10-CM | POA: Diagnosis not present

## 2017-06-07 DIAGNOSIS — R41841 Cognitive communication deficit: Secondary | ICD-10-CM | POA: Diagnosis not present

## 2017-06-07 DIAGNOSIS — R278 Other lack of coordination: Secondary | ICD-10-CM | POA: Diagnosis not present

## 2017-06-08 DIAGNOSIS — R41841 Cognitive communication deficit: Secondary | ICD-10-CM | POA: Diagnosis not present

## 2017-06-08 DIAGNOSIS — F0390 Unspecified dementia without behavioral disturbance: Secondary | ICD-10-CM | POA: Diagnosis not present

## 2017-06-08 DIAGNOSIS — R278 Other lack of coordination: Secondary | ICD-10-CM | POA: Diagnosis not present

## 2017-06-08 DIAGNOSIS — F411 Generalized anxiety disorder: Secondary | ICD-10-CM | POA: Diagnosis not present

## 2017-06-08 DIAGNOSIS — F331 Major depressive disorder, recurrent, moderate: Secondary | ICD-10-CM | POA: Diagnosis not present

## 2017-06-08 DIAGNOSIS — G4700 Insomnia, unspecified: Secondary | ICD-10-CM | POA: Diagnosis not present

## 2017-06-08 DIAGNOSIS — M6281 Muscle weakness (generalized): Secondary | ICD-10-CM | POA: Diagnosis not present

## 2017-06-08 DIAGNOSIS — R2689 Other abnormalities of gait and mobility: Secondary | ICD-10-CM | POA: Diagnosis not present

## 2017-06-08 DIAGNOSIS — N179 Acute kidney failure, unspecified: Secondary | ICD-10-CM | POA: Diagnosis not present

## 2017-06-09 DIAGNOSIS — R278 Other lack of coordination: Secondary | ICD-10-CM | POA: Diagnosis not present

## 2017-06-09 DIAGNOSIS — R41841 Cognitive communication deficit: Secondary | ICD-10-CM | POA: Diagnosis not present

## 2017-06-09 DIAGNOSIS — R2689 Other abnormalities of gait and mobility: Secondary | ICD-10-CM | POA: Diagnosis not present

## 2017-06-09 DIAGNOSIS — M6281 Muscle weakness (generalized): Secondary | ICD-10-CM | POA: Diagnosis not present

## 2017-06-09 DIAGNOSIS — N179 Acute kidney failure, unspecified: Secondary | ICD-10-CM | POA: Diagnosis not present

## 2017-06-10 DIAGNOSIS — R278 Other lack of coordination: Secondary | ICD-10-CM | POA: Diagnosis not present

## 2017-06-10 DIAGNOSIS — R2689 Other abnormalities of gait and mobility: Secondary | ICD-10-CM | POA: Diagnosis not present

## 2017-06-10 DIAGNOSIS — R41841 Cognitive communication deficit: Secondary | ICD-10-CM | POA: Diagnosis not present

## 2017-06-10 DIAGNOSIS — N179 Acute kidney failure, unspecified: Secondary | ICD-10-CM | POA: Diagnosis not present

## 2017-06-10 DIAGNOSIS — M6281 Muscle weakness (generalized): Secondary | ICD-10-CM | POA: Diagnosis not present

## 2017-06-13 DIAGNOSIS — R278 Other lack of coordination: Secondary | ICD-10-CM | POA: Diagnosis not present

## 2017-06-13 DIAGNOSIS — M6281 Muscle weakness (generalized): Secondary | ICD-10-CM | POA: Diagnosis not present

## 2017-06-13 DIAGNOSIS — N179 Acute kidney failure, unspecified: Secondary | ICD-10-CM | POA: Diagnosis not present

## 2017-06-13 DIAGNOSIS — R41841 Cognitive communication deficit: Secondary | ICD-10-CM | POA: Diagnosis not present

## 2017-06-13 DIAGNOSIS — R2689 Other abnormalities of gait and mobility: Secondary | ICD-10-CM | POA: Diagnosis not present

## 2017-06-14 DIAGNOSIS — R2689 Other abnormalities of gait and mobility: Secondary | ICD-10-CM | POA: Diagnosis not present

## 2017-06-14 DIAGNOSIS — R41841 Cognitive communication deficit: Secondary | ICD-10-CM | POA: Diagnosis not present

## 2017-06-14 DIAGNOSIS — N179 Acute kidney failure, unspecified: Secondary | ICD-10-CM | POA: Diagnosis not present

## 2017-06-14 DIAGNOSIS — M6281 Muscle weakness (generalized): Secondary | ICD-10-CM | POA: Diagnosis not present

## 2017-06-14 DIAGNOSIS — R278 Other lack of coordination: Secondary | ICD-10-CM | POA: Diagnosis not present

## 2017-06-15 DIAGNOSIS — E1010 Type 1 diabetes mellitus with ketoacidosis without coma: Secondary | ICD-10-CM | POA: Diagnosis not present

## 2017-06-15 DIAGNOSIS — R2689 Other abnormalities of gait and mobility: Secondary | ICD-10-CM | POA: Diagnosis not present

## 2017-06-15 DIAGNOSIS — D649 Anemia, unspecified: Secondary | ICD-10-CM | POA: Diagnosis not present

## 2017-06-15 DIAGNOSIS — R41841 Cognitive communication deficit: Secondary | ICD-10-CM | POA: Diagnosis not present

## 2017-06-15 DIAGNOSIS — F0390 Unspecified dementia without behavioral disturbance: Secondary | ICD-10-CM | POA: Diagnosis not present

## 2017-06-15 DIAGNOSIS — F331 Major depressive disorder, recurrent, moderate: Secondary | ICD-10-CM | POA: Diagnosis not present

## 2017-06-15 DIAGNOSIS — F411 Generalized anxiety disorder: Secondary | ICD-10-CM | POA: Diagnosis not present

## 2017-06-15 DIAGNOSIS — E039 Hypothyroidism, unspecified: Secondary | ICD-10-CM | POA: Diagnosis not present

## 2017-06-15 DIAGNOSIS — I10 Essential (primary) hypertension: Secondary | ICD-10-CM | POA: Diagnosis not present

## 2017-06-15 DIAGNOSIS — N179 Acute kidney failure, unspecified: Secondary | ICD-10-CM | POA: Diagnosis not present

## 2017-06-15 DIAGNOSIS — R278 Other lack of coordination: Secondary | ICD-10-CM | POA: Diagnosis not present

## 2017-06-15 DIAGNOSIS — M6281 Muscle weakness (generalized): Secondary | ICD-10-CM | POA: Diagnosis not present

## 2017-06-21 ENCOUNTER — Other Ambulatory Visit: Payer: Self-pay

## 2017-06-21 ENCOUNTER — Encounter (HOSPITAL_COMMUNITY): Payer: Self-pay | Admitting: Emergency Medicine

## 2017-06-21 ENCOUNTER — Inpatient Hospital Stay (HOSPITAL_COMMUNITY)
Admission: EM | Admit: 2017-06-21 | Discharge: 2017-06-23 | DRG: 638 | Disposition: A | Discharge status: Skilled Nursing Facility | Payer: Medicare Other | Attending: Internal Medicine | Admitting: Internal Medicine

## 2017-06-21 ENCOUNTER — Emergency Department (HOSPITAL_COMMUNITY): Payer: Medicare Other

## 2017-06-21 DIAGNOSIS — I2510 Atherosclerotic heart disease of native coronary artery without angina pectoris: Secondary | ICD-10-CM | POA: Diagnosis present

## 2017-06-21 DIAGNOSIS — E108 Type 1 diabetes mellitus with unspecified complications: Secondary | ICD-10-CM | POA: Diagnosis not present

## 2017-06-21 DIAGNOSIS — J9811 Atelectasis: Secondary | ICD-10-CM | POA: Diagnosis not present

## 2017-06-21 DIAGNOSIS — E1043 Type 1 diabetes mellitus with diabetic autonomic (poly)neuropathy: Secondary | ICD-10-CM | POA: Diagnosis present

## 2017-06-21 DIAGNOSIS — Z888 Allergy status to other drugs, medicaments and biological substances status: Secondary | ICD-10-CM

## 2017-06-21 DIAGNOSIS — M797 Fibromyalgia: Secondary | ICD-10-CM | POA: Diagnosis present

## 2017-06-21 DIAGNOSIS — Z885 Allergy status to narcotic agent status: Secondary | ICD-10-CM

## 2017-06-21 DIAGNOSIS — I4581 Long QT syndrome: Secondary | ICD-10-CM | POA: Diagnosis present

## 2017-06-21 DIAGNOSIS — M8580 Other specified disorders of bone density and structure, unspecified site: Secondary | ICD-10-CM | POA: Diagnosis present

## 2017-06-21 DIAGNOSIS — K3184 Gastroparesis: Secondary | ICD-10-CM | POA: Diagnosis not present

## 2017-06-21 DIAGNOSIS — Z881 Allergy status to other antibiotic agents status: Secondary | ICD-10-CM

## 2017-06-21 DIAGNOSIS — E10319 Type 1 diabetes mellitus with unspecified diabetic retinopathy without macular edema: Secondary | ICD-10-CM | POA: Diagnosis present

## 2017-06-21 DIAGNOSIS — E1010 Type 1 diabetes mellitus with ketoacidosis without coma: Principal | ICD-10-CM | POA: Diagnosis present

## 2017-06-21 DIAGNOSIS — N179 Acute kidney failure, unspecified: Secondary | ICD-10-CM | POA: Diagnosis not present

## 2017-06-21 DIAGNOSIS — E785 Hyperlipidemia, unspecified: Secondary | ICD-10-CM | POA: Diagnosis present

## 2017-06-21 DIAGNOSIS — Z79899 Other long term (current) drug therapy: Secondary | ICD-10-CM

## 2017-06-21 DIAGNOSIS — F0390 Unspecified dementia without behavioral disturbance: Secondary | ICD-10-CM | POA: Diagnosis present

## 2017-06-21 DIAGNOSIS — F329 Major depressive disorder, single episode, unspecified: Secondary | ICD-10-CM | POA: Diagnosis present

## 2017-06-21 DIAGNOSIS — Z66 Do not resuscitate: Secondary | ICD-10-CM | POA: Diagnosis present

## 2017-06-21 DIAGNOSIS — D72829 Elevated white blood cell count, unspecified: Secondary | ICD-10-CM | POA: Diagnosis present

## 2017-06-21 DIAGNOSIS — E039 Hypothyroidism, unspecified: Secondary | ICD-10-CM | POA: Diagnosis present

## 2017-06-21 DIAGNOSIS — D638 Anemia in other chronic diseases classified elsewhere: Secondary | ICD-10-CM | POA: Diagnosis present

## 2017-06-21 DIAGNOSIS — I252 Old myocardial infarction: Secondary | ICD-10-CM

## 2017-06-21 DIAGNOSIS — R9431 Abnormal electrocardiogram [ECG] [EKG]: Secondary | ICD-10-CM | POA: Diagnosis present

## 2017-06-21 DIAGNOSIS — Z8673 Personal history of transient ischemic attack (TIA), and cerebral infarction without residual deficits: Secondary | ICD-10-CM

## 2017-06-21 DIAGNOSIS — I10 Essential (primary) hypertension: Secondary | ICD-10-CM | POA: Diagnosis present

## 2017-06-21 DIAGNOSIS — K219 Gastro-esophageal reflux disease without esophagitis: Secondary | ICD-10-CM | POA: Diagnosis present

## 2017-06-21 DIAGNOSIS — R739 Hyperglycemia, unspecified: Secondary | ICD-10-CM | POA: Diagnosis not present

## 2017-06-21 LAB — CBC WITH DIFFERENTIAL/PLATELET
Basophils Absolute: 0 10*3/uL (ref 0.0–0.1)
Basophils Relative: 0 %
Eosinophils Absolute: 0 10*3/uL (ref 0.0–0.7)
Eosinophils Relative: 0 %
HEMATOCRIT: 30.4 % — AB (ref 36.0–46.0)
HEMOGLOBIN: 10.1 g/dL — AB (ref 12.0–15.0)
LYMPHS ABS: 0.8 10*3/uL (ref 0.7–4.0)
Lymphocytes Relative: 6 %
MCH: 27.8 pg (ref 26.0–34.0)
MCHC: 33.2 g/dL (ref 30.0–36.0)
MCV: 83.7 fL (ref 78.0–100.0)
MONOS PCT: 7 %
Monocytes Absolute: 0.9 10*3/uL (ref 0.1–1.0)
NEUTROS ABS: 10.6 10*3/uL — AB (ref 1.7–7.7)
NEUTROS PCT: 87 %
Platelets: 250 10*3/uL (ref 150–400)
RBC: 3.63 MIL/uL — ABNORMAL LOW (ref 3.87–5.11)
RDW: 14.1 % (ref 11.5–15.5)
WBC: 12.2 10*3/uL — ABNORMAL HIGH (ref 4.0–10.5)

## 2017-06-21 LAB — BLOOD GAS, VENOUS
Acid-base deficit: 9.2 mmol/L — ABNORMAL HIGH (ref 0.0–2.0)
BICARBONATE: 15.2 mmol/L — AB (ref 20.0–28.0)
FIO2: 21
O2 Saturation: 83.3 %
PATIENT TEMPERATURE: 98.6
PCO2 VEN: 29.2 mmHg — AB (ref 44.0–60.0)
PO2 VEN: 48.2 mmHg — AB (ref 32.0–45.0)
pH, Ven: 7.336 (ref 7.250–7.430)

## 2017-06-21 LAB — CBG MONITORING, ED: GLUCOSE-CAPILLARY: 425 mg/dL — AB (ref 65–99)

## 2017-06-21 LAB — BASIC METABOLIC PANEL
Anion gap: 19 — ABNORMAL HIGH (ref 5–15)
BUN: 29 mg/dL — ABNORMAL HIGH (ref 6–20)
CHLORIDE: 100 mmol/L — AB (ref 101–111)
CO2: 17 mmol/L — AB (ref 22–32)
CREATININE: 1.34 mg/dL — AB (ref 0.44–1.00)
Calcium: 10.1 mg/dL (ref 8.9–10.3)
GFR calc non Af Amer: 37 mL/min — ABNORMAL LOW (ref >60)
GFR, EST AFRICAN AMERICAN: 43 mL/min — AB (ref >60)
GLUCOSE: 323 mg/dL — AB (ref 65–99)
Potassium: 3.9 mmol/L (ref 3.5–5.1)
Sodium: 136 mmol/L (ref 135–145)

## 2017-06-21 MED ORDER — POTASSIUM CHLORIDE 10 MEQ/100ML IV SOLN
10.0000 meq | INTRAVENOUS | Status: AC
  Administered 2017-06-22 (×2): 10 meq via INTRAVENOUS

## 2017-06-21 MED ORDER — DEXTROSE-NACL 5-0.45 % IV SOLN: INTRAVENOUS | Status: DC

## 2017-06-21 MED ORDER — SODIUM CHLORIDE 0.9 % IV BOLUS (SEPSIS)
500.0000 mL | Freq: Once | INTRAVENOUS | Status: AC
  Administered 2017-06-22: 500 mL via INTRAVENOUS

## 2017-06-21 MED ORDER — SODIUM CHLORIDE 0.9 % IV SOLN
INTRAVENOUS | Status: DC
  Administered 2017-06-21: via INTRAVENOUS

## 2017-06-21 MED ORDER — SODIUM CHLORIDE 0.9 % IV SOLN
INTRAVENOUS | Status: DC
  Filled 2017-06-21: qty 1

## 2017-06-21 MED ORDER — SODIUM CHLORIDE 0.9 % IV BOLUS (SEPSIS): 1000.0000 mL | Freq: Once | INTRAVENOUS | Status: DC

## 2017-06-21 NOTE — ED Triage Notes (Signed)
Pt seen by fire and them [facility]EM[facility] d/t elevated blood sugar [facility] 500 ml 0.9NSS bolus given CBG rechecked  575. [facility] staff report giving evening levamir not given per [facility]. Pt is DNR

## 2017-06-21 NOTE — ED Provider Notes (Signed)
[facility]-EMERGENCY DEPT Provider Note   CSN: [id number] Arrival date & time: 06/21/17  2029  LEVEL 5 CAVEAT - DEMENTIA   History   Chief Complaint Chief Complaint  Patient presents with  . Hyperglycemia    HPI Beth Payne is a 77 y.o. female.  HPI  77 year old female with a history of type 1 diabetes and multiple episodes of prior DKA presents with hyperglycemia.  The patient is demented and only answers "yes" to all my questions.  She does seem to indicate that she is in pain, and states "everywhere" when asked where.  Otherwise cannot provide history.  The staff called EMS because her blood sugar read high this evening.  She has been given 500 mL normal saline bolus.  Nursing staff reported giving Levemir tonight as well.  Otherwise if no information is available.  I called Joetta Manners but no one answered.  Past Medical History:  Diagnosis Date  . Anxiety   . Arthritis    "fingers" (12/23/2014)  . Chronic lower back pain   . Coronary artery disease   . Dementia   . Depression   . Fibromyalgia   . Fracture, ankle 12/23/2014  . GERD (gastroesophageal reflux disease) dx'd 1995  . Heart murmur   . NSTEMI (non-ST elevated myocardial infarction) (HCC) 12/13/2014  . Osteopenia dx'd 2007  . Retinopathy   . TIA (transient ischemic attack)   . Type I diabetes mellitus (HCC) dx'd 1953   "I'm on a pup" (12/23/2014)    Patient Active Problem List   Diagnosis Date Noted  . Prolonged Q-T interval on ECG 05/14/2017  . Nausea and vomiting 05/14/2017  . Leukocytosis 05/14/2017  . DKA (diabetic ketoacidoses) (HCC) 02/02/2016  . DKA, type 1 (HCC) 02/02/2016  . Chronic pain syndrome   . Depression   . Anxiety state   . Other specified hypothyroidism   . Diabetic gastroparesis (HCC) 01/19/2016  . Transaminasemia   . Transaminitis 12/13/2015  . Hypomagnesemia 11/24/2015  . Protein-calorie malnutrition, severe 11/22/2015  . Pressure ulcer, stage I,  non-blanching erythema 11/22/2015  . 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 Dementia 11/20/2015  . Abdominal pain 11/20/2015  . Weakness generalized 05/20/2015  . Lung mass 05/20/2015  . Hypokalemia 04/24/2015  . Acute encephalopathy 01/01/2015  . Type 1 diabetes mellitus with peripheral circulatory complications (HCC) 12/17/2014  . CAD in native artery   . Pulmonary hypertension (HCC)   . GERD (gastroesophageal reflux disease) 12/12/2014  . CAD s/p stent to mid-RCA in 2001. 12/12/2014  . Osteoarthritis 12/12/2014  . Systolic murmur 12/12/2014  . Normocytic anemia 12/12/2014  . Dyslipidemia 12/12/2014    Past Surgical History:  Procedure Laterality Date  . ABDOMINAL HYSTERECTOMY  1972  . ANTERIOR CERVICAL DECOMP/DISCECTOMY FUSION  05/2003   "C5-6"  . APPENDECTOMY    . BACK SURGERY    . BILATERAL CARPAL TUNNEL RELEASE Bilateral 2003-2004  . BUNIONECTOMY WITH HAMMERTOE RECONSTRUCTION Right 02/1999  . CATARACT EXTRACTION W/ INTRAOCULAR LENS  IMPLANT, BILATERAL Bilateral 1998  . CERVICAL DISC SURGERY  2004   "collapsed C-5"  . CESAREAN SECTION    . CORONARY ANGIOPLASTY WITH STENT PLACEMENT  07/1999   "?1"  . DILATION AND CURETTAGE OF UTERUS    . EXPLORATORY LAPAROTOMY  1975-1987 X 1   "adhesions"  . EYE SURGERY Bilateral since 1977   "numerous slaser surgeries for retinopathy"  . FRACTURE SURGERY    . LAPAROSCOPIC LYSIS OF ADHESIONS  4098-1191 X 5  . LUMBAR MICRODISCECTOMY  03/2005  . OOPHORECTOMY Bilateral 1980's  . ORIF ANKLE FRACTURE Left 12/25/2014   Procedure: OPEN REDUCTION INTERNAL FIXATION (ORIF) LEFT TRIMALLEOLAR ANKLE FRACTURE;  Surgeon: Tarry Kos, MD;  Location: [facility] OR;  Service: Orthopedics;  Laterality: Left;  . PATELLA FRACTURE SURGERY Left 04/2007  . TONSILLECTOMY    . TRIGGER FINGER RELEASE Bilateral 2003  . VERTEBROPLASTY  01/2008   "compressed lumbar fracture"  . VITRECTOMY Right 08/1997    OB History    No data available       Home Medications    Prior to  Admission medications   Medication Sig Start Date End Date Taking? Authorizing Provider  amLODipine (NORVASC) 10 MG tablet Take 1 tablet (10 mg total) by mouth daily. 05/22/15  Yes Richarda Overlie, MD  ARIPiprazole (ABILIFY) 5 MG tablet Take 1 tablet (5 mg total) by mouth every morning. Patient taking differently: Take 5 mg by mouth daily.  04/28/15  Yes Ghimire, Werner Lean, MD  cholecalciferol (VITAMIN D) 1000 units tablet Take 2,000 Units by mouth daily.   Yes [provider]  donepezil (ARICEPT) 10 MG tablet Take 10 mg by mouth at bedtime.   Yes [provider]  ferrous sulfate 325 (65 FE) MG tablet Take 325 mg by mouth 2 (two) times daily.    Yes [provider]  glucagon (GLUCAGON EMERGENCY) 1 MG injection Inject 1 mg into the vein once as needed (low blood sugar).   Yes [provider]  insulin detemir (LEVEMIR) 100 UNIT/ML injection Inject 0.1 mLs (10 Units total) into the skin 2 (two) times daily. 05/18/17  Yes Dorothea Ogle, MD  insulin lispro (HUMALOG) 100 UNIT/ML injection Inject 2 Units into the skin 3 (three) times daily.    Yes [provider]  lamoTRIgine (LAMICTAL) 25 MG tablet Take 100 mg by mouth at bedtime.    Yes [provider]  levothyroxine (SYNTHROID, LEVOTHROID) 50 MCG tablet Take 50 mcg by mouth daily before breakfast.   Yes [provider]  LORazepam (ATIVAN) 0.5 MG tablet Take 1 tablet (0.5 mg total) by mouth at bedtime. Anxiety 05/17/17  Yes Dorothea Ogle, MD  magnesium hydroxide (MILK OF MAGNESIA) 400 MG/5ML suspension Take 30 mLs by mouth daily as needed for mild constipation.   Yes [provider]  Melatonin 3 MG TABS Take 3 mg by mouth at bedtime.   Yes [provider]  metoCLOPramide (REGLAN) 5 MG tablet Take 1 tablet (5 mg total) by mouth 3 (three) times daily before meals. Patient taking differently: Take 5 mg by mouth 3 (three) times daily before meals. For Gastroparesis 11/24/15  Yes  Rama, Maryruth Bun, MD  ondansetron (ZOFRAN) 4 MG tablet Take 4 mg by mouth every 6 (six) hours as needed for nausea or vomiting.   Yes [provider]  pantoprazole (PROTONIX) 40 MG tablet Take 1 tablet (40 mg total) by mouth 2 (two) times daily. 04/28/15  Yes Ghimire, Werner Lean, MD  polyethylene glycol (MIRALAX / GLYCOLAX) packet Take 17 g by mouth daily. Patient taking differently: Take 17 g by mouth daily. Mix in 8 oz water and drink 11/24/15  Yes Rama, Maryruth Bun, MD  prazosin (MINIPRESS) 1 MG capsule Take 1 mg by mouth at bedtime.    Yes [provider]  promethazine (PHENERGAN) 25 MG/ML injection Inject 12.5 mg into the vein every 6 (six) hours as needed for nausea or vomiting.   Yes [provider]  rosuvastatin (CRESTOR) 5 MG tablet  Take 5 mg by mouth daily at 6 PM.    Yes [provider]  sertraline (ZOLOFT) 50 MG tablet Take 50 mg by mouth 2 (two) times daily.    Yes [provider]  insulin aspart (NOVOLOG) 100 UNIT/ML injection Inject at bedtime based upon sugars.  Sugar 0-120--no units; 121-150--1 unit;  151-200--2 units;  201-250--3 units; 251-300--5 units;  301-350--7 units;  351-400--9 units Patient not taking: Reported on 06/21/2017 12/15/15   Catarina Hartshornat, David, MD    Family History Family History  Problem Relation Age of Onset  . Liver disease Sister   . Colon cancer Brother     Social History Social History   Tobacco Use  . Smoking status: Former Smoker    Packs/day: 1.00    Years: 27.00    Pack years: 27.00    Types: Cigarettes  . Smokeless tobacco: Never Used  . Tobacco comment: "quit smoking cigarettes in 1985  Substance Use Topics  . Alcohol use: No  . Drug use: No     Allergies   Diclofenac sodium; Doxycycline calcium; Calcium-containing compounds; Codeine; and Zocor [simvastatin]   Review of Systems Review of Systems  Unable to perform ROS: Dementia     Physical Exam Updated Vital Signs BP 132/85   Pulse  (!) 102   Temp 99.9 F (37.7 C) (Rectal)   Resp 20   SpO2 100%   Physical Exam  Constitutional: She appears well-developed and well-nourished.  HENT:  Head: Normocephalic and atraumatic.  Right Ear: External ear normal.  Left Ear: External ear normal.  Nose: Nose normal.  Mouth/Throat: Mucous membranes are dry.  Eyes: Right eye exhibits no discharge. Left eye exhibits no discharge.  Cardiovascular: Normal rate, regular rhythm and normal heart sounds.  Pulmonary/Chest: Effort normal and breath sounds normal.  Abdominal: Soft. She exhibits no distension. There is no tenderness.  Neurological: She is alert. She is disoriented.  Awake, alert, but only answers "yes". Moves all 4 extremities  Skin: Skin is warm and dry.  Nursing note and vitals reviewed.    ED Treatments / Results  Labs (all labs ordered are listed, but only abnormal results are displayed) Labs Reviewed  BLOOD GAS, VENOUS - Abnormal; Notable for the following components:      Result Value   pCO2, Ven 29.2 (*)    pO2, Ven 48.2 (*)    Bicarbonate 15.2 (*)    Acid-base deficit 9.2 (*)    All other components within normal limits  BASIC METABOLIC PANEL - Abnormal; Notable for the following components:   Chloride 100 (*)    CO2 17 (*)    Glucose, Bld 323 (*)    BUN 29 (*)    Creatinine, Ser 1.34 (*)    GFR calc non Af Amer 37 (*)    GFR calc Af Amer 43 (*)    Anion gap 19 (*)    All other components within normal limits  CBC WITH DIFFERENTIAL/PLATELET - Abnormal; Notable for the following components:   WBC 12.2 (*)    RBC 3.63 (*)    Hemoglobin 10.1 (*)    HCT 30.4 (*)    Neutro Abs 10.6 (*)    All other components within normal limits  CBG MONITORING, ED - Abnormal; Notable for the following components:   Glucose-Capillary 425 (*)    All other components within normal limits  URINALYSIS, ROUTINE W REFLEX MICROSCOPIC    EKG  EKG Interpretation  Date/Time:  Wednesday June 22 2017 00:22:48  EST Ventricular Rate:  97 PR Interval:    QRS Duration: 124 QT Interval:  400 QTC Calculation: 506 R Axis:   79 Text Interpretation:  Sinus rhythm Right bundle branch block Baseline wander in lead(s) V3 no significant change since Dec 2018 Confirmed by Pricilla Loveless [phone]) on 06/22/2017 12:48:51 AM       Radiology Dg Chest Portable 1 View  Result Date: 06/21/2017 CLINICAL DATA:  Elevated blood sugar EXAM: PORTABLE CHEST 1 VIEW COMPARISON:  05/14/2017 radiograph FINDINGS: Linear atelectasis at the left base. No focal consolidation or pleural effusion. Mild cardiomegaly. Aortic atherosclerosis. No pneumothorax. IMPRESSION: Low lung volumes with minimal atelectasis at the left base Electronically Signed   By: Jasmine Pang M.D.   On: 06/21/2017 22:51    Procedures .Critical Care Performed by: Pricilla Loveless, MD Authorized by: Pricilla Loveless, MD   Critical care provider statement:    Critical care time (minutes):  30   Critical care was necessary to treat or prevent imminent or life-threatening deterioration of the following conditions:  Endocrine crisis   Critical care was time spent personally by me on the following activities:  Discussions with consultants, evaluation of patient's response to treatment, examination of patient, ordering and performing treatments and interventions, ordering and review of laboratory studies, ordering and review of radiographic studies, pulse oximetry, re-evaluation of patient's condition and review of old charts   (including critical care time)  Medications Ordered in ED Medications  sodium chloride 0.9 % bolus 1,000 mL (not administered)  insulin regular (NOVOLIN R,HUMULIN R) 100 Units in sodium chloride 0.9 % 100 mL (1 Units/mL) infusion (not administered)  0.9 %  sodium chloride infusion (not administered)  sodium chloride 0.9 % bolus 500 mL (not administered)  dextrose 5 %-0.45 % sodium chloride infusion (not administered)  potassium chloride  10 mEq in 100 mL IVPB (not administered)     Initial Impression / Assessment and Plan / ED Course  I have reviewed the triage vital signs and the nursing notes.  Pertinent labs & imaging results that were available during my care of the patient were reviewed by me and considered in my medical decision making (see chart for details).    Patient's workup is consistent with DKA, which is a recurrent issue for her.  Patient has dementia but otherwise does not appear altered from baseline.  No clear infectious source although urinalysis is pending.  If urinalysis positive, treat for UTI as cause of DKA.  Otherwise she was given fluids and will be started on insulin.  Also will get IV potassium given normal potassium while about to start an insulin drip.  Dr. Katrinka Blazing to admit.  Final Clinical Impressions(s) / ED Diagnoses   Final diagnoses:  Diabetic ketoacidosis without coma associated with type 1 diabetes mellitus [facility])    ED Discharge Orders    None       Pricilla Loveless, MD 06/22/17 [phone]

## 2017-06-21 NOTE — ED Notes (Signed)
Bed: WA21 Expected date:  Expected time:  Means of arrival:  Comments: EMS 77 yo female from SNF-hyperglycemia-CBG "High" 128/60 99% RA fluid bolus

## 2017-06-22 ENCOUNTER — Other Ambulatory Visit: Payer: Self-pay

## 2017-06-22 DIAGNOSIS — D638 Anemia in other chronic diseases classified elsewhere: Secondary | ICD-10-CM | POA: Diagnosis present

## 2017-06-22 DIAGNOSIS — E1010 Type 1 diabetes mellitus with ketoacidosis without coma: Secondary | ICD-10-CM | POA: Diagnosis not present

## 2017-06-22 DIAGNOSIS — I252 Old myocardial infarction: Secondary | ICD-10-CM | POA: Diagnosis not present

## 2017-06-22 DIAGNOSIS — Z881 Allergy status to other antibiotic agents status: Secondary | ICD-10-CM | POA: Diagnosis not present

## 2017-06-22 DIAGNOSIS — F329 Major depressive disorder, single episode, unspecified: Secondary | ICD-10-CM | POA: Diagnosis present

## 2017-06-22 DIAGNOSIS — E039 Hypothyroidism, unspecified: Secondary | ICD-10-CM | POA: Diagnosis not present

## 2017-06-22 DIAGNOSIS — E785 Hyperlipidemia, unspecified: Secondary | ICD-10-CM | POA: Diagnosis present

## 2017-06-22 DIAGNOSIS — R9431 Abnormal electrocardiogram [ECG] [EKG]: Secondary | ICD-10-CM

## 2017-06-22 DIAGNOSIS — Z66 Do not resuscitate: Secondary | ICD-10-CM | POA: Diagnosis present

## 2017-06-22 DIAGNOSIS — D72829 Elevated white blood cell count, unspecified: Secondary | ICD-10-CM | POA: Diagnosis present

## 2017-06-22 DIAGNOSIS — E1310 Other specified diabetes mellitus with ketoacidosis without coma: Secondary | ICD-10-CM | POA: Diagnosis not present

## 2017-06-22 DIAGNOSIS — Z885 Allergy status to narcotic agent status: Secondary | ICD-10-CM | POA: Diagnosis not present

## 2017-06-22 DIAGNOSIS — K219 Gastro-esophageal reflux disease without esophagitis: Secondary | ICD-10-CM | POA: Diagnosis present

## 2017-06-22 DIAGNOSIS — E1043 Type 1 diabetes mellitus with diabetic autonomic (poly)neuropathy: Secondary | ICD-10-CM | POA: Diagnosis present

## 2017-06-22 DIAGNOSIS — N179 Acute kidney failure, unspecified: Secondary | ICD-10-CM | POA: Diagnosis present

## 2017-06-22 DIAGNOSIS — J9811 Atelectasis: Secondary | ICD-10-CM | POA: Diagnosis present

## 2017-06-22 DIAGNOSIS — Z888 Allergy status to other drugs, medicaments and biological substances status: Secondary | ICD-10-CM | POA: Diagnosis not present

## 2017-06-22 DIAGNOSIS — K3184 Gastroparesis: Secondary | ICD-10-CM | POA: Diagnosis present

## 2017-06-22 DIAGNOSIS — I2510 Atherosclerotic heart disease of native coronary artery without angina pectoris: Secondary | ICD-10-CM | POA: Diagnosis present

## 2017-06-22 DIAGNOSIS — E10319 Type 1 diabetes mellitus with unspecified diabetic retinopathy without macular edema: Secondary | ICD-10-CM | POA: Diagnosis present

## 2017-06-22 DIAGNOSIS — I4581 Long QT syndrome: Secondary | ICD-10-CM | POA: Diagnosis present

## 2017-06-22 DIAGNOSIS — F0390 Unspecified dementia without behavioral disturbance: Secondary | ICD-10-CM

## 2017-06-22 DIAGNOSIS — Z8673 Personal history of transient ischemic attack (TIA), and cerebral infarction without residual deficits: Secondary | ICD-10-CM | POA: Diagnosis not present

## 2017-06-22 DIAGNOSIS — M797 Fibromyalgia: Secondary | ICD-10-CM | POA: Diagnosis present

## 2017-06-22 DIAGNOSIS — M8580 Other specified disorders of bone density and structure, unspecified site: Secondary | ICD-10-CM | POA: Diagnosis present

## 2017-06-22 DIAGNOSIS — I10 Essential (primary) hypertension: Secondary | ICD-10-CM | POA: Diagnosis present

## 2017-06-22 DIAGNOSIS — R4182 Altered mental status, unspecified: Secondary | ICD-10-CM | POA: Diagnosis not present

## 2017-06-22 LAB — CBG MONITORING, ED
Glucose-Capillary: 366 mg/dL — ABNORMAL HIGH (ref 65–99)
Glucose-Capillary: 334 mg/dL — ABNORMAL HIGH (ref 65–99)

## 2017-06-22 LAB — BASIC METABOLIC PANEL
ANION GAP: 11 (ref 5–15)
ANION GAP: 10 (ref 5–15)
BUN: 19 mg/dL (ref 6–20)
BUN: 18 mg/dL (ref 6–20)
CALCIUM: 9.9 mg/dL (ref 8.9–10.3)
CALCIUM: 9.6 mg/dL (ref 8.9–10.3)
CO2: 19 mmol/L — ABNORMAL LOW (ref 22–32)
CO2: 20 mmol/L — ABNORMAL LOW (ref 22–32)
Chloride: 108 mmol/L (ref 101–111)
Chloride: 107 mmol/L (ref 101–111)
Creatinine, Ser: 0.94 mg/dL (ref 0.44–1.00)
Creatinine, Ser: 0.79 mg/dL (ref 0.44–1.00)
GFR, EST NON AFRICAN AMERICAN: 57 mL/min — AB (ref >60)
GLUCOSE: 130 mg/dL — AB (ref 65–99)
Glucose, Bld: 186 mg/dL — ABNORMAL HIGH (ref 65–99)
Potassium: 4 mmol/L (ref 3.5–5.1)
Potassium: 3.9 mmol/L (ref 3.5–5.1)
Sodium: 138 mmol/L (ref 135–145)
Sodium: 137 mmol/L (ref 135–145)

## 2017-06-22 LAB — GLUCOSE, CAPILLARY
GLUCOSE-CAPILLARY: 170 mg/dL — AB (ref 65–99)
GLUCOSE-CAPILLARY: 152 mg/dL — AB (ref 65–99)
GLUCOSE-CAPILLARY: 135 mg/dL — AB (ref 65–99)
GLUCOSE-CAPILLARY: 101 mg/dL — AB (ref 65–99)
GLUCOSE-CAPILLARY: 111 mg/dL — AB (ref 65–99)
Glucose-Capillary: 246 mg/dL — ABNORMAL HIGH (ref 65–99)
Glucose-Capillary: 136 mg/dL — ABNORMAL HIGH (ref 65–99)
Glucose-Capillary: 128 mg/dL — ABNORMAL HIGH (ref 65–99)
Glucose-Capillary: 195 mg/dL — ABNORMAL HIGH (ref 65–99)
Glucose-Capillary: 152 mg/dL — ABNORMAL HIGH (ref 65–99)

## 2017-06-22 LAB — CBC WITH DIFFERENTIAL/PLATELET
Basophils Absolute: 0 10*3/uL (ref 0.0–0.1)
Basophils Relative: 0 %
EOS ABS: 0 10*3/uL (ref 0.0–0.7)
EOS PCT: 0 %
HCT: 28.7 % — ABNORMAL LOW (ref 36.0–46.0)
Hemoglobin: 9.6 g/dL — ABNORMAL LOW (ref 12.0–15.0)
LYMPHS ABS: 1.5 10*3/uL (ref 0.7–4.0)
Lymphocytes Relative: 13 %
MCH: 27.4 pg (ref 26.0–34.0)
MCHC: 33.4 g/dL (ref 30.0–36.0)
MCV: 82 fL (ref 78.0–100.0)
Monocytes Absolute: 1.1 10*3/uL — ABNORMAL HIGH (ref 0.1–1.0)
Monocytes Relative: 9 %
Neutro Abs: 9.2 10*3/uL — ABNORMAL HIGH (ref 1.7–7.7)
Neutrophils Relative %: 78 %
PLATELETS: 222 10*3/uL (ref 150–400)
RBC: 3.5 MIL/uL — AB (ref 3.87–5.11)
RDW: 14 % (ref 11.5–15.5)
WBC: 11.8 10*3/uL — AB (ref 4.0–10.5)

## 2017-06-22 LAB — URINALYSIS, ROUTINE W REFLEX MICROSCOPIC
Bilirubin Urine: NEGATIVE
Ketones, ur: 80 mg/dL — AB
Leukocytes, UA: NEGATIVE
Nitrite: NEGATIVE
PROTEIN: NEGATIVE mg/dL
Specific Gravity, Urine: 1.014 (ref 1.005–1.030)
Squamous Epithelial / LPF: NONE SEEN
pH: 5 (ref 5.0–8.0)

## 2017-06-22 LAB — MRSA PCR SCREENING: MRSA by PCR: NEGATIVE

## 2017-06-22 LAB — TSH: TSH: 0.365 u[IU]/mL (ref 0.350–4.500)

## 2017-06-22 LAB — MAGNESIUM: Magnesium: 1.9 mg/dL (ref 1.7–2.4)

## 2017-06-22 MED ORDER — SERTRALINE HCL 50 MG PO TABS
50.0000 mg | ORAL_TABLET | Freq: Two times a day (BID) | ORAL | Status: DC
  Administered 2017-06-22 – 2017-06-23 (×2): 50 mg via ORAL
  Filled 2017-06-22 (×2): qty 1

## 2017-06-22 MED ORDER — SODIUM CHLORIDE 0.9 % IV SOLN
INTRAVENOUS | Status: DC
  Administered 2017-06-22 – 2017-06-23 (×2): via INTRAVENOUS

## 2017-06-22 MED ORDER — INSULIN DETEMIR 100 UNIT/ML ~~LOC~~ SOLN
10.0000 [IU] | Freq: Two times a day (BID) | SUBCUTANEOUS | Status: DC
  Administered 2017-06-22 (×2): 10 [IU] via SUBCUTANEOUS
  Filled 2017-06-22 (×6): qty 0.1

## 2017-06-22 MED ORDER — ONDANSETRON HCL 4 MG/2ML IJ SOLN: 4.0000 mg | Freq: Four times a day (QID) | INTRAMUSCULAR | Status: DC | PRN

## 2017-06-22 MED ORDER — ENOXAPARIN SODIUM 30 MG/0.3ML ~~LOC~~ SOLN
30.0000 mg | SUBCUTANEOUS | Status: DC
  Administered 2017-06-22 – 2017-06-23 (×2): 30 mg via SUBCUTANEOUS
  Filled 2017-06-22 (×2): qty 0.3

## 2017-06-22 MED ORDER — FERROUS SULFATE 325 (65 FE) MG PO TABS
325.0000 mg | ORAL_TABLET | Freq: Two times a day (BID) | ORAL | Status: DC
  Administered 2017-06-22 – 2017-06-23 (×2): 325 mg via ORAL
  Filled 2017-06-22 (×3): qty 1

## 2017-06-22 MED ORDER — INSULIN ASPART 100 UNIT/ML ~~LOC~~ SOLN: 0.0000 [IU] | Freq: Every day | SUBCUTANEOUS | Status: DC

## 2017-06-22 MED ORDER — ENSURE ENLIVE PO LIQD
237.0000 mL | Freq: Two times a day (BID) | ORAL | Status: DC
  Administered 2017-06-22 – 2017-06-23 (×2): 237 mL via ORAL

## 2017-06-22 MED ORDER — DONEPEZIL HCL 5 MG PO TABS: 10.0000 mg | ORAL_TABLET | Freq: Every day | ORAL | Status: DC

## 2017-06-22 MED ORDER — POLYETHYLENE GLYCOL 3350 17 G PO PACK
17.0000 g | PACK | Freq: Every day | ORAL | Status: DC
  Administered 2017-06-22 – 2017-06-23 (×2): 17 g via ORAL
  Filled 2017-06-22 (×2): qty 1

## 2017-06-22 MED ORDER — MELATONIN 3 MG PO TABS: 3.0000 mg | ORAL_TABLET | Freq: Every day | ORAL | Status: DC

## 2017-06-22 MED ORDER — DEXTROSE-NACL 5-0.45 % IV SOLN
INTRAVENOUS | Status: DC
  Administered 2017-06-22 (×2): via INTRAVENOUS

## 2017-06-22 MED ORDER — PRAZOSIN HCL 1 MG PO CAPS
1.0000 mg | ORAL_CAPSULE | Freq: Every day | ORAL | Status: DC
  Administered 2017-06-22: 1 mg via ORAL
  Filled 2017-06-22 (×3): qty 1

## 2017-06-22 MED ORDER — LORAZEPAM 2 MG/ML IJ SOLN
0.5000 mg | Freq: Once | INTRAMUSCULAR | Status: AC
  Administered 2017-06-22: 0.5 mg via INTRAVENOUS

## 2017-06-22 MED ORDER — LORAZEPAM 2 MG/ML IJ SOLN
INTRAMUSCULAR | Status: AC
  Filled 2017-06-22: qty 1

## 2017-06-22 MED ORDER — ADULT MULTIVITAMIN W/MINERALS CH
1.0000 | ORAL_TABLET | Freq: Every day | ORAL | Status: DC
  Administered 2017-06-23: 1 via ORAL
  Filled 2017-06-22 (×2): qty 1

## 2017-06-22 MED ORDER — PANTOPRAZOLE SODIUM 40 MG PO TBEC: 40.0000 mg | DELAYED_RELEASE_TABLET | Freq: Two times a day (BID) | ORAL | Status: DC

## 2017-06-22 MED ORDER — SODIUM CHLORIDE 0.9 % IV SOLN
INTRAVENOUS | Status: DC
  Administered 2017-06-22: 01:00:00 via INTRAVENOUS
  Filled 2017-06-22: qty 1

## 2017-06-22 MED ORDER — ROSUVASTATIN CALCIUM 5 MG PO TABS: 5.0000 mg | ORAL_TABLET | Freq: Every day | ORAL | Status: DC

## 2017-06-22 MED ORDER — METOPROLOL TARTRATE 5 MG/5ML IV SOLN
5.0000 mg | INTRAVENOUS | Status: DC | PRN
  Administered 2017-06-22 (×3): 5 mg via INTRAVENOUS
  Filled 2017-06-22 (×3): qty 5

## 2017-06-22 MED ORDER — INSULIN ASPART 100 UNIT/ML ~~LOC~~ SOLN
0.0000 [IU] | Freq: Three times a day (TID) | SUBCUTANEOUS | Status: DC
  Administered 2017-06-22: 3 [IU] via SUBCUTANEOUS

## 2017-06-22 MED ORDER — ONDANSETRON HCL 4 MG PO TABS: 4.0000 mg | ORAL_TABLET | Freq: Four times a day (QID) | ORAL | Status: DC | PRN

## 2017-06-22 MED ORDER — SODIUM CHLORIDE 0.9% FLUSH
3.0000 mL | Freq: Two times a day (BID) | INTRAVENOUS | Status: DC
  Administered 2017-06-22 (×2): 3 mL via INTRAVENOUS

## 2017-06-22 MED ORDER — LORAZEPAM 2 MG/ML IJ SOLN
0.5000 mg | Freq: Three times a day (TID) | INTRAMUSCULAR | Status: DC | PRN
  Administered 2017-06-22: 0.5 mg via INTRAVENOUS
  Filled 2017-06-22: qty 1

## 2017-06-22 MED ORDER — LORAZEPAM 0.5 MG PO TABS
0.5000 mg | ORAL_TABLET | Freq: Every day | ORAL | Status: DC
  Administered 2017-06-22 (×2): 0.5 mg via ORAL
  Filled 2017-06-22 (×2): qty 1

## 2017-06-22 MED ORDER — POTASSIUM CHLORIDE 10 MEQ/100ML IV SOLN
INTRAVENOUS | Status: AC
  Administered 2017-06-22: 10 meq via INTRAVENOUS
  Filled 2017-06-22: qty 200

## 2017-06-22 MED ORDER — AMLODIPINE BESYLATE 10 MG PO TABS
10.0000 mg | ORAL_TABLET | Freq: Every day | ORAL | Status: DC
  Administered 2017-06-23: 10 mg via ORAL
  Filled 2017-06-22 (×2): qty 1

## 2017-06-22 MED ORDER — ARIPIPRAZOLE 5 MG PO TABS: 5.0000 mg | ORAL_TABLET | Freq: Every day | ORAL | Status: DC

## 2017-06-22 MED ORDER — METOCLOPRAMIDE HCL 10 MG PO TABS: 5.0000 mg | ORAL_TABLET | Freq: Three times a day (TID) | ORAL | Status: DC

## 2017-06-22 MED ORDER — LEVOTHYROXINE SODIUM 50 MCG PO TABS
50.0000 ug | ORAL_TABLET | Freq: Every day | ORAL | Status: DC
  Administered 2017-06-23: 50 ug via ORAL
  Filled 2017-06-22 (×2): qty 1

## 2017-06-22 MED ORDER — LAMOTRIGINE 100 MG PO TABS
100.0000 mg | ORAL_TABLET | Freq: Every day | ORAL | Status: DC
  Administered 2017-06-22 (×2): 100 mg via ORAL
  Filled 2017-06-22 (×2): qty 1

## 2017-06-22 NOTE — Care Management Note (Signed)
Case Management Note  Patient Details  Name: Beth Payne MRN: [id number] Date of Birth: 07/08/1940  Subjective/Objective:                  dka  Action/Plan: Date: June 22, 2017 Marcelle SmilingRhonda Davis, BSN, EsteroRN3, [place]CCM [phone] Chart and notes review for patient progress and needs. Will follow for case management and discharge needs. No cm or discharge needs present at time of this review. Next review date: [id number] Expected Discharge Date:                  Expected Discharge Plan:  Home/Self Care  In-House Referral:     Discharge planning Services  CM Consult  Post Acute Care Choice:    Choice offered to:     DME Arranged:    DME Agency:     HH Arranged:    HH Agency:     Status of Service:  In process, will continue to follow  If discussed at Long Length of Stay Meetings, dates discussed:    Additional Comments:  Golda AcreDavis, Rhonda Lynn, RN 06/22/2017, 8:45 AM

## 2017-06-22 NOTE — Progress Notes (Signed)
Initial Nutrition Assessment  DOCUMENTATION CODES:   Severe malnutrition in context of chronic illness, Underweight  INTERVENTION:  - Will order Ensure Enlive BID, each supplement provides 350 kcal and 20 grams of protein - Will order daily multivitamin with minerals. - Tech/RN to provide feeding assistance; at least until mittens able to be removed. - Continue to encourage PO intakes. - RD will monitor for additional nutrition-related needs.  NUTRITION DIAGNOSIS:   Severe Malnutrition related to chronic illness(hx of Type 1 DM with poor control) as evidenced by severe muscle depletion, severe fat depletion.  GOAL:   Patient will meet greater than or equal to 90% of their needs  MONITOR:   PO intake, Supplement acceptance, Weight trends, Labs  REASON FOR ASSESSMENT:   Malnutrition Screening Tool  ASSESSMENT:   77 y.o. female with medical history significant of brittle DM type I, dementia, TIA, MI, CAD, hypothyroidism, fibromyalgia, chronic pain, and GERD; who presented from nursing facility for complaints of elevated blood sugars.  History is mostly obtained from review of records as patient has dementia.  Patient was reportedly  given her nighttime dose of Levemir prior to transport.  Patient does complain of some abdominal pain.  Review of records shows patient with multiple admissions in the past for DKA.  Last admitted on 05/13/17-05/18/17 for the same.  En route with EMS patient given 500 mL of normal saline and CBG noted to be 575.  BMI indicates underweight. No intakes documented since admission. No family/visitors present at this time. Breakfast tray on bedside table and it appears that someone attempted to give her a bite of scrambled egg and sips of liquids but does not appear that pt consumed anything from the tray. RD asked several questions but pt only mumbled and no information was able to be elicited at this time.   Physical assessment outlined below and was some what  limited by pt's position in her bed. Per chart review, current weight is consistent with weight on 02/02/16. She gained a few pounds from that date to weigh 101 lbs on 05/16/17 and then subsequently lost 4 lbs (4% body weight) over the past 1 month which is not significant for time frame. Will need to keep a close eye on weight trends given identification of severe malnutrition and underweight. Noted CBGs but feel that, if she will accept it, Ensure is the best option to optimize kcal and protein.   Medications reviewed; 325 mg ferrous sulfate BID, sliding scale Novolog, 10 units Levemir BID, 50 mcg oral Synthroid/day, 10 mEq IV KCl x2 runs yesterday. Labs reviewed; CBGs: 128-366 mg/dL today, Cl: 161 mmol/L, BUN: 29 mg/dL, creatinine: 0.96 mg/dL, GFR: 37 mg/dL.  IVF: D5-1/2 NS @ 125 mL/hr (510 kcal).    NUTRITION - FOCUSED PHYSICAL EXAM:    Most Recent Value  Orbital Region  Unable to assess  Upper Arm Region  Severe depletion  Thoracic and Lumbar Region  Unable to assess  Buccal Region  Moderate depletion  Temple Region  Unable to assess  Clavicle Bone Region  Severe depletion  Clavicle and Acromion Bone Region  Severe depletion  Scapular Bone Region  Unable to assess  Dorsal Hand  Unable to assess [bilateral mittens]  Patellar Region  Moderate depletion  Anterior Thigh Region  Severe depletion  Posterior Calf Region  Severe depletion  Edema (RD Assessment)  None  Hair  Reviewed  Eyes  Reviewed  Mouth  Unable to assess  Skin  Reviewed  Nails  Unable to  assess       Diet Order:  Diet heart healthy/carb modified Room service appropriate? Yes; Fluid consistency: Thin  EDUCATION NEEDS:   Not appropriate for education at this time  Skin:  Skin Assessment: Reviewed RN Assessment  Last BM:  PTA/unknown  Height:   Ht Readings from Last 1 Encounters:  06/22/17 5\' 2"  (1.575 m)    Weight:   Wt Readings from Last 1 Encounters:  06/22/17 97 lb 10.6 oz (44.3 kg)    Ideal Body  Weight:  50 kg  BMI:  Body mass index is 17.86 kg/m.  Estimated Nutritional Needs:   Kcal:  1330-1505 (30-34 kcal/kg)  Protein:  44-53 grams (1-1.2 garms/kg)  Fluid:  >/= 1.5 L/day      Trenton GammonJessica Ottavio Norem, MS, RD, LDN, [facility] CenterCNSC Inpatient Clinical Dietitian Pager # [phone] After hours/weekend pager # [phone]

## 2017-06-22 NOTE — ED Notes (Signed)
ED TO INPATIENT HANDOFF REPORT  Name/Age/Gender Beth Payne 77 y.o. female  Code Status    Code Status Orders  (From admission, onward)        Start     Ordered   06/22/17 0101  Do not attempt resuscitation (DNR)  Continuous    Question Answer Comment  In the event of cardiac or respiratory ARREST Do not call a "code blue"   In the event of cardiac or respiratory ARREST Do not perform Intubation, CPR, defibrillation or ACLS   In the event of cardiac or respiratory ARREST Use medication by any route, position, wound care, and other measures to relive pain and suffering. May use oxygen, suction and manual treatment of airway obstruction as needed for comfort.      06/22/17 0104    Code Status History    Date Active Date Inactive Code Status Order ID Comments User Context   05/14/2017 04:36 05/18/2017 19:22 DNR [id number]  Norval Morton, MD ED   05/14/2017 04:27 05/14/2017 04:36 Full Code [id number]  Norval Morton, MD ED   02/02/2016 17:48 02/03/2016 18:46 Full Code [id number]  Thurnell Lose, MD ED   01/19/2016 11:59 01/23/2016 18:56 DNR [id number]  Waldemar Dickens, MD ED   01/19/2016 11:59 01/19/2016 11:59 DNR [id number]  Waldemar Dickens, MD ED   12/14/2015 05:13 12/15/2015 19:33 DNR [id number]  Edwin Dada, MD Inpatient   12/14/2015 05:06 12/14/2015 05:13 DNR [id number]  Edwin Dada, MD Inpatient   11/20/2015 22:31 11/24/2015 16:57 DNR [id number]  [name], DO ED   06/21/2015 15:28 06/27/2015 21:35 DNR [id number]  Modena Jansky, MD Inpatient   06/21/2015 01:13 06/21/2015 15:28 Full Code [id number]  [name], DO ED   05/20/2015 00:42 05/22/2015 20:40 Full Code [id number]  Rise Patience, MD Inpatient   04/24/2015 15:20 04/26/2015 14:54 Full Code [id number]  Radene Gunning, NP Inpatient   02/24/2015 00:20 02/27/2015 18:25 Full Code [id number]  Theressa Millard, MD ED   02/23/2015 21:39 02/24/2015 00:20 Full Code [id number]  Theressa Millard, MD ED   01/01/2015 00:47 01/01/2015 20:25 DNR [id number]  [name], DO ED   01/01/2015 00:44 01/01/2015 00:47 Full Code [id number]  [name], DO ED   12/23/2014 17:52 12/27/2014 17:40 Full Code [id number]  Geradine Girt, DO Inpatient   12/12/2014 13:04 12/16/2014 21:03 DNR [id number]  Samella Parr, NP ED    Advance Directive Documentation     Most Recent Value  Type of Advance Directive  Out of facility DNR (pink MOST or yellow form)  Pre-existing out of facility DNR order (yellow form or pink MOST form)  Yellow form placed in chart (order not valid for inpatient use)  "MOST" Form in Place?  No data      Home/SNF/Other Nursing Home  Chief Complaint Hyperglycemia  Level of Care/Admitting Diagnosis ED Disposition    ED Disposition Condition Comment   Admit  Hospital Area: [facility] [[id number]]  Level of Care: Stepdown [14]  Admit to SDU based on following criteria: Other see comments  Comments: DKA  Diagnosis: DKA, type 1 [facility]) [[id number]]  Admitting Physician: Norval Morton [[id number]]  Attending Physician: Norval Morton [[id number]]  Estimated length of stay: past midnight tomorrow  Certification:: I certify this patient will need inpatient services for at least 2 midnights  PT Class (Do Not Modify): Inpatient [101]  PT Acc Code (Do Not Modify): Private [1]  Medical History Past Medical History:  Diagnosis Date  . Anxiety   . Arthritis    "fingers" (12/23/2014)  . Chronic lower back pain   . Coronary artery disease   . Dementia   . Depression   . Fibromyalgia   . Fracture, ankle 12/23/2014  . GERD (gastroesophageal reflux disease) dx'd 1995  . Heart murmur   . NSTEMI (non-ST elevated myocardial infarction) ([place]) 12/13/2014  . Osteopenia dx'd 2007  . Retinopathy   . TIA (transient ischemic attack)   . Type I diabetes mellitus ([place]) dx'd 1953   "I'm on a pup" (12/23/2014)    Allergies Allergies  Allergen Reactions  . Diclofenac Sodium  Swelling and Other (See Comments)    Blurred vision  . Doxycycline Calcium Swelling and Other (See Comments)    Blurred vision  . Calcium-Containing Compounds Other (See Comments)    Calcium listed as an allergy on [facility] 08/10/15  . Codeine Nausea And Vomiting and Other (See Comments)    Dizziness   . Zocor [Simvastatin] Other (See Comments)    Dizziness, weakness     IV Location/Drains/Wounds Patient Lines/Drains/Airways Status   Active Line/Drains/Airways    Name:   Placement date:   Placement time:   Site:   Days:   Peripheral IV 05/14/17 Left Hand   05/14/17    0227    Hand   39   Peripheral IV 06/21/17 Right Antecubital   06/21/17    -    Antecubital   1          Labs/Imaging Results for orders placed or performed during the hospital encounter of 06/21/17 (from the past 48 hour(s))  CBG monitoring, ED     Status: Abnormal   Collection Time: 06/21/17  8:48 PM  Result Value Ref Range   Glucose-Capillary 425 (H) 65 - 99 mg/dL   Comment 1 Notify RN    Comment 2 Document in Chart   Basic metabolic panel     Status: Abnormal   Collection Time: 06/21/17 10:25 PM  Result Value Ref Range   Sodium 136 135 - 145 mmol/L   Potassium 3.9 3.5 - 5.1 mmol/L   Chloride 100 (L) 101 - 111 mmol/L   CO2 17 (L) 22 - 32 mmol/L   Glucose, Bld 323 (H) 65 - 99 mg/dL   BUN 29 (H) 6 - 20 mg/dL   Creatinine, Ser 1.34 (H) 0.44 - 1.00 mg/dL   Calcium 10.1 8.9 - 10.3 mg/dL   GFR calc non Af Amer 37 (L) >60 mL/min   GFR calc Af Amer 43 (L) >60 mL/min    Comment: (NOTE) The eGFR has been calculated using the CKD EPI equation. This calculation has not been validated in all clinical situations. eGFR's persistently <60 mL/min signify possible Chronic Kidney Disease.    Anion gap 19 (H) 5 - 15  CBC with Differential     Status: Abnormal   Collection Time: 06/21/17 10:25 PM  Result Value Ref Range   WBC 12.2 (H) 4.0 - 10.5 K/uL   RBC 3.63 (L) 3.87 - 5.11 MIL/uL   Hemoglobin 10.1 (L) 12.0 - 15.0 g/dL    HCT 30.4 (L) 36.0 - 46.0 %   MCV 83.7 78.0 - 100.0 fL   MCH 27.8 26.0 - 34.0 pg   MCHC 33.2 30.0 - 36.0 g/dL   RDW 14.1 11.5 - 15.5 %   Platelets 250 150 - 400 K/uL   Neutrophils Relative % 87 %   Neutro  Abs 10.6 (H) 1.7 - 7.7 K/uL   Lymphocytes Relative 6 %   Lymphs Abs 0.8 0.7 - 4.0 K/uL   Monocytes Relative 7 %   Monocytes Absolute 0.9 0.1 - 1.0 K/uL   Eosinophils Relative 0 %   Eosinophils Absolute 0.0 0.0 - 0.7 K/uL   Basophils Relative 0 %   Basophils Absolute 0.0 0.0 - 0.1 K/uL   Smear Review MORPHOLOGY UNREMARKABLE   Blood gas, venous     Status: Abnormal   Collection Time: 06/21/17 10:39 PM  Result Value Ref Range   FIO2 21.00    pH, Ven 7.336 7.250 - 7.430   pCO2, Ven 29.2 (L) 44.0 - 60.0 mmHg   pO2, Ven 48.2 (H) 32.0 - 45.0 mmHg   Bicarbonate 15.2 (L) 20.0 - 28.0 mmol/L   Acid-base deficit 9.2 (H) 0.0 - 2.0 mmol/L   O2 Saturation 83.3 %   Patient temperature 98.6    Collection site VEIN    Drawn by DRAWN BY RN    Sample type VENOUS   Urinalysis, Routine w reflex microscopic     Status: Abnormal   Collection Time: 06/22/17 12:45 AM  Result Value Ref Range   Color, Urine YELLOW YELLOW   APPearance CLEAR CLEAR   Specific Gravity, Urine 1.014 1.005 - 1.030   pH 5.0 5.0 - 8.0   Glucose, UA >=500 (A) NEGATIVE mg/dL   Hgb urine dipstick SMALL (A) NEGATIVE   Bilirubin Urine NEGATIVE NEGATIVE   Ketones, ur 80 (A) NEGATIVE mg/dL   Protein, ur NEGATIVE NEGATIVE mg/dL   Nitrite NEGATIVE NEGATIVE   Leukocytes, UA NEGATIVE NEGATIVE   RBC / HPF 0-5 0 - 5 RBC/hpf   WBC, UA 0-5 0 - 5 WBC/hpf   Bacteria, UA RARE (A) NONE SEEN   Squamous Epithelial / LPF NONE SEEN NONE SEEN   Mucus PRESENT   CBG monitoring, ED     Status: Abnormal   Collection Time: 06/22/17  1:03 AM  Result Value Ref Range   Glucose-Capillary 366 (H) 65 - 99 mg/dL  CBG monitoring, ED     Status: Abnormal   Collection Time: 06/22/17  2:05 AM  Result Value Ref Range   Glucose-Capillary 334 (H) 65 -  99 mg/dL   Dg Chest Portable 1 View  Result Date: 06/21/2017 CLINICAL DATA:  Elevated blood sugar EXAM: PORTABLE CHEST 1 VIEW COMPARISON:  05/14/2017 radiograph FINDINGS: Linear atelectasis at the left base. No focal consolidation or pleural effusion. Mild cardiomegaly. Aortic atherosclerosis. No pneumothorax. IMPRESSION: Low lung volumes with minimal atelectasis at the left base Electronically Signed   By: Donavan Foil M.D.   On: 06/21/2017 22:51    Pending Labs Unresulted Labs (From admission, onward)   Start     Ordered   06/22/17 0500  CBC with Differential/Platelet  Tomorrow morning,   R     06/22/17 0104   06/22/17 0500  Magnesium  Tomorrow morning,   R     06/22/17 0104   06/22/17 0110  TSH  Add-on,   R     06/22/17 0109   06/22/17 8280  Basic metabolic panel  STAT Now then every 4 hours ,   STAT     06/22/17 0104      Vitals/Pain Today's Vitals   06/22/17 0030 06/22/17 0045 06/22/17 0100 06/22/17 0130  BP: 132/85 132/85 (!) 146/58 (!) 154/79  Pulse: 93 (!) 102 96 (!) 108  Resp: (!) _0 Temp:  TempSrc:      SpO2: 99% 100% 98% 100%  PainSc:        Isolation Precautions No active isolations  Medications Medications  sodium chloride 0.9 % bolus 1,000 mL (0 mLs Intravenous Stopped 06/21/17 2313)  potassium chloride 10 mEq in 100 mL IVPB (not administered)  levothyroxine (SYNTHROID, LEVOTHROID) tablet 50 mcg (not administered)  lamoTRIgine (LAMICTAL) tablet 100 mg (not administered)  LORazepam (ATIVAN) tablet 0.5 mg (not administered)  ferrous sulfate tablet 325 mg (not administered)  0.9 %  sodium chloride infusion ( Intravenous New Bag/Given 06/22/17 0126)  dextrose 5 %-0.45 % sodium chloride infusion (not administered)  insulin regular (NOVOLIN R,HUMULIN R) 100 Units in sodium chloride 0.9 % 100 mL (1 Units/mL) infusion (5.5 Units/hr Intravenous Rate/Dose Change 06/22/17 0210)  enoxaparin (LOVENOX) injection 40 mg (not administered)  sodium chloride flush  (NS) 0.9 % injection 3 mL (not administered)  sodium chloride 0.9 % bolus 500 mL (500 mLs Intravenous New Bag/Given 06/22/17 0102)    Mobility non-ambulatory

## 2017-06-22 NOTE — Progress Notes (Signed)
Pt cardiac monitor ringing out with periods of VTach. During these alarms pt is agitated and shaking. Believed to be artifact and not true VTach. When pt lies still she rund NSR/ST 90-100's. Spoke with [employer].

## 2017-06-22 NOTE — NC FL2 (Signed)
[place] MEDICAID FL2 LEVEL OF CARE SCREENING TOOL     IDENTIFICATION  Patient Name: Beth Payne Birthdate: 10/04/1940 Sex: female Admission Date (Current Location): 06/21/2017  [facility] [place]   [facility] Address[facility]  [facility],  [address], [place] [id number]      Provider Number: [id number]  Attending Physician Name and Address:  Alwyn RenMathews, Elizabeth G, MD  Relative Name and Phone Number:       Current Level of Care: Hospital Recommended Level of Care: Skilled Nursing Facility Prior Approval Number:    Date Approved/Denied:   PASRR Number: [id number]  Discharge Plan: SNF    Current Diagnoses: Patient Active Problem List   Diagnosis Date Noted  . Anemia of chronic disease 06/22/2017  . Prolonged Q-T interval on ECG 05/14/2017  . Nausea and vomiting 05/14/2017  . Leukocytosis 05/14/2017  . DKA (diabetic ketoacidoses) (HCC) 02/02/2016  . DKA, type 1 (HCC) 02/02/2016  . Chronic pain syndrome   . Depression   . Anxiety state   . Hypothyroidism   . Diabetic gastroparesis (HCC) 01/19/2016  . Transaminasemia   . Transaminitis 12/13/2015  . Hypomagnesemia 11/24/2015  . Protein-calorie malnutrition, severe 11/22/2015  . Pressure ulcer, stage I, non-blanching erythema 11/22/2015  . 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 Dementia 11/20/2015  . Abdominal pain 11/20/2015  . Weakness generalized 05/20/2015  . Lung mass 05/20/2015  . Hypokalemia 04/24/2015  . Acute encephalopathy 01/01/2015  . Type 1 diabetes mellitus with peripheral circulatory complications (HCC) 12/17/2014  . CAD in native artery   . Pulmonary hypertension (HCC)   . GERD (gastroesophageal reflux disease) 12/12/2014  . CAD s/p stent to mid-RCA in 2001. 12/12/2014  . Osteoarthritis 12/12/2014  . Systolic murmur 12/12/2014  . Normocytic anemia 12/12/2014  . Dyslipidemia 12/12/2014    Orientation RESPIRATION BLADDER Height & Weight     Self, Time, Situation,  Place  Normal Continent Weight: 97 lb 10.6 oz (44.3 kg) Height:  5\' 2"  (157.5 cm)(approx)  BEHAVIORAL SYMPTOMS/MOOD NEUROLOGICAL BOWEL NUTRITION STATUS      Continent Diet(See D/C Summary.)  AMBULATORY STATUS COMMUNICATION OF NEEDS Skin   Extensive Assist Verbally Normal                       Personal Care Assistance Level of Assistance  Bathing, Feeding, Dressing Bathing Assistance: Limited assistance Feeding assistance: Independent Dressing Assistance: Limited assistance     Functional Limitations Info  Hearing, Sight, Speech Sight Info: Adequate Hearing Info: Adequate Speech Info: Adequate    SPECIAL CARE FACTORS FREQUENCY                       Contractures Contractures Info: Not present    Additional Factors Info  Code Status, Allergies Code Status Info: DNR  Allergies Info: Diclofenac Sodium, Doxycycline Calcium, Calcium-containing Compounds, Codeine, Zocor Simvastatin           Current Medications (06/22/2017):  This is the current hospital active medication list Current Facility-Administered Medications  Medication Dose Route Frequency Provider Last Rate Last Dose  . 0.9 %  sodium chloride infusion   Intravenous Continuous Clydie BraunSmith, Rondell A, MD   Stopped at 06/22/17 0458  . amLODipine (NORVASC) tablet 10 mg  10 mg Oral Daily Alwyn RenMathews, Elizabeth G, MD      . dextrose 5 %-0.45 % sodium chloride infusion   Intravenous Continuous Clydie BraunSmith, Rondell A, MD 125 mL/hr at 06/22/17 0458    . enoxaparin (  LOVENOX) injection 30 mg  30 mg Subcutaneous Q24H Smith, Rondell A, MD   30 mg at 06/22/17 1101  . feeding supplement (ENSURE ENLIVE) (ENSURE ENLIVE) liquid 237 mL  237 mL Oral BID BM Alwyn Ren, MD   237 mL at 06/22/17 1414  . ferrous sulfate tablet 325 mg  325 mg Oral BID Smith, Rondell A, MD      . insulin aspart (novoLOG) injection 0-15 Units  0-15 Units Subcutaneous TID WC Alwyn Ren, MD   3 Units at 06/22/17 1232  . insulin aspart (novoLOG)  injection 0-5 Units  0-5 Units Subcutaneous QHS Alwyn Ren, MD      . insulin detemir (LEVEMIR) injection 10 Units  10 Units Subcutaneous BID Alwyn Ren, MD   10 Units at 06/22/17 0900  . insulin regular (NOVOLIN R,HUMULIN R) 100 Units in sodium chloride 0.9 % 100 mL (1 Units/mL) infusion   Intravenous Continuous Clydie Braun, MD   Stopped at 06/22/17 0754  . lamoTRIgine (LAMICTAL) tablet 100 mg  100 mg Oral QHS Smith, Rondell A, MD   100 mg at 06/22/17 0350  . levothyroxine (SYNTHROID, LEVOTHROID) tablet 50 mcg  50 mcg Oral QAC breakfast Katrinka Blazing, Rondell A, MD      . LORazepam (ATIVAN) injection 0.5 mg  0.5 mg Intravenous Q8H PRN Alwyn Ren, MD   0.5 mg at 06/22/17 1413  . LORazepam (ATIVAN) tablet 0.5 mg  0.5 mg Oral QHS Smith, Rondell A, MD   0.5 mg at 06/22/17 0350  . metoprolol tartrate (LOPRESSOR) injection 5 mg  5 mg Intravenous Q4H PRN Alwyn Ren, MD   5 mg at 06/22/17 1427  . multivitamin with minerals tablet 1 tablet  1 tablet Oral Daily Alwyn Ren, MD      . polyethylene glycol (MIRALAX / GLYCOLAX) packet 17 g  17 g Oral Daily Alwyn Ren, MD   17 g at 06/22/17 1414  . prazosin (MINIPRESS) capsule 1 mg  1 mg Oral QHS Alwyn Ren, MD      . rosuvastatin (CRESTOR) tablet 5 mg  5 mg Oral q1800 Alwyn Ren, MD      . sertraline (ZOLOFT) tablet 50 mg  50 mg Oral BID Alwyn Ren, MD      . sodium chloride 0.9 % bolus 1,000 mL  1,000 mL Intravenous Once Pricilla Loveless, MD   Stopped at 06/21/17 2255  . sodium chloride flush (NS) 0.9 % injection 3 mL  3 mL Intravenous Q12H Smith, Rondell A, MD   3 mL at 06/22/17 0500     Discharge Medications: Please see discharge summary for a list of discharge medications.  Relevant Imaging Results:  Relevant Lab Results:   Additional Information SSN:[id number]  Clearance Coots, LCSW

## 2017-06-22 NOTE — Progress Notes (Signed)
PROGRESS NOTE    Beth Payne  ZOX:[id number] DOB: 02-Jan-1941 DOA: 06/21/2017 PCP: [name], MD  Brief Narrative:  77 y.o. female with medical history significant of brittle DM type I, dementia, TIA, MI, CAD, hypothyroidism, fibromyalgia, chronic pain, and GERD; who presented from nursing facility for complaints of elevated blood sugars.  History is mostly obtained from review of records as patient has dementia.  Patient was reportedly  given her nighttime dose of Levemir prior to transport.  Patient does complain of some abdominal pain.  Review of records shows patient with multiple admissions in the past for DKA.  Last admitted on 05/13/17-05/18/17 for the same.  En route with EMS patient given 500 mL of normal saline and CBG noted to be 575.  ED Course: Upon admission into the emergency department patient was noted to have a temperature of 99.9 F, pulse 98-99, respirations 18, blood pressure 136/87- 192/140, and O2 saturations maintained on room air.  Labs revealed WBC 12.2, hemoglobin 10.1, platelets 258, sodium 136, potassium 3.9, chloride 17, BUN 29, creatinine 1.34, and glucose 323.  Patient was given 1.5 L of normal saline IV fluids, then started on glucose stabilizer protocol.     Assessment & Plan:   Principal Problem:   DKA, type 1 (HCC) Active Problems:   Dementia   Hypothyroidism   Prolonged Q-T interval on ECG   Anemia of chronic disease  DKA Resolved.restart long acting insulin.blood sugar in the low 100s with gap 10.restart humalog.  Looks like patient has had recurrent admissions for DKA.  MRSA PCR has been negative.  AKI resolved with a creatinine of 0.79.  DEMENTIA I would continue to hold the Reglan due to prolonged QT as well as increased likelihood of extrapyramidal symptoms.  Restart Abilify and Aricept.  Hypertension restart Norvasc and Minipress.  Anemia of chronic disease patient takes iron sulfate 2 times a day prior to admission.   Restart.  Hypothyroidism continue Synthroid.  Hyperlipidemia restart Crestor.  Depression restart Zoloft.     DVT prophylaxis: SCD Code Status: DNR Family Communication: No family available Disposition Plan: If patient remains stable she can be discharged back to nursing home tomorrow. Consultants: None none Procedures: None Antimicrobials: None  Subjective: Patient in bed.  She did does not respond to questions being asked.  She is awake.   Objective: Vitals:   06/22/17 0630 06/22/17 0645 06/22/17 0700 06/22/17 0719  BP: (!) 113/51  (!) 160/58   Pulse: 94 99 (!) 104   Resp: 17 17 (!) 25   Temp:    98.7 F (37.1 C)  TempSrc:    Oral  SpO2: 96% 98% 97%   Weight:      Height:        Intake/Output Summary (Last 24 hours) at 06/22/2017 1049 Last data filed at 06/22/2017 8119 Gross per 24 hour  Intake 846.56 ml  Output 1 ml  Net 845.56 ml   Filed Weights   06/22/17 0318  Weight: 44.3 kg (97 lb 10.6 oz)    Examination:  General exam: Appears calm and comfortable  Respiratory system: Clear to auscultation. Respiratory effort normal. Cardiovascular system: S1 & S2 heard, RRR. No JVD, murmurs, rubs, gallops or clicks. No pedal edema. Gastrointestinal system: Abdomen is nondistended, soft and nontender. No organomegaly or masses felt. Normal bowel sounds heard. Central nervous system: Alert and oriented. No focal neurological deficits. Extremities: Symmetric 5 x 5 power. Skin: No rashes, lesions or ulcers Psychiatry: Judgement and insight appear normal. Mood &  affect appropriate.     Data Reviewed: I have personally reviewed following labs and imaging studies  CBC: Recent Labs  Lab 06/21/17 2225 06/22/17 0714  WBC 12.2* 11.8*  NEUTROABS 10.6* 9.2*  HGB 10.1* 9.6*  HCT 30.4* 28.7*  MCV 83.7 82.0  PLT 250 222   Basic Metabolic Panel: Recent Labs  Lab 06/21/17 2225 06/22/17 0435 06/22/17 0714  NA 136 138 137  K 3.9 4.0 3.9  CL 100* 108 107  CO2 17*  19* 20*  GLUCOSE 323* 186* 130*  BUN 29* 19 18  CREATININE 1.34* 0.94 0.79  CALCIUM 10.1 9.9 9.6  MG  --  1.9  --    GFR: Estimated Creatinine Clearance: 41.8 mL/min (by C-G formula based on SCr of 0.79 mg/dL). Liver Function Tests: No results for input(s): AST, ALT, ALKPHOS, BILITOT, PROT, ALBUMIN in the last 168 hours. No results for input(s): LIPASE, AMYLASE in the last 168 hours. No results for input(s): AMMONIA in the last 168 hours. Coagulation Profile: No results for input(s): INR, PROTIME in the last 168 hours. Cardiac Enzymes: No results for input(s): CKTOTAL, CKMB, CKMBINDEX, TROPONINI in the last 168 hours. BNP (last 3 results) No results for input(s): PROBNP in the last 8760 hours. HbA1C: No results for input(s): HGBA1C in the last 72 hours. CBG: Recent Labs  Lab 06/22/17 0436 06/22/17 0531 06/22/17 0631 06/22/17 0751 06/22/17 0927  GLUCAP 170* 152* 136* 128* 135*   Lipid Profile: No results for input(s): CHOL, HDL, LDLCALC, TRIG, CHOLHDL, LDLDIRECT in the last 72 hours. Thyroid Function Tests: Recent Labs    06/22/17 0435  TSH 0.365   Anemia Panel: No results for input(s): VITAMINB12, FOLATE, FERRITIN, TIBC, IRON, RETICCTPCT in the last 72 hours. Sepsis Labs: No results for input(s): PROCALCITON, LATICACIDVEN in the last 168 hours.  Recent Results (from the past 240 hour(s))  MRSA PCR Screening     Status: None   Collection Time: 06/22/17  3:23 AM  Result Value Ref Range Status   MRSA by PCR NEGATIVE NEGATIVE Final    Comment:        The GeneXpert MRSA Assay (FDA approved for NASAL specimens only), is one component of a comprehensive MRSA colonization surveillance program. It is not intended to diagnose MRSA infection nor to guide or monitor treatment for MRSA infections.          Radiology Studies: Dg Chest Portable 1 View  Result Date: 06/21/2017 CLINICAL DATA:  Elevated blood sugar EXAM: PORTABLE CHEST 1 VIEW COMPARISON:  05/14/2017  radiograph FINDINGS: Linear atelectasis at the left base. No focal consolidation or pleural effusion. Mild cardiomegaly. Aortic atherosclerosis. No pneumothorax. IMPRESSION: Low lung volumes with minimal atelectasis at the left base Electronically Signed   By: Jasmine Pang M.D.   On: 06/21/2017 22:51        Scheduled Meds: . amLODipine  10 mg Oral Daily  . enoxaparin (LOVENOX) injection  30 mg Subcutaneous Q24H  . feeding supplement (ENSURE ENLIVE)  237 mL Oral BID BM  . ferrous sulfate  325 mg Oral BID  . insulin aspart  0-15 Units Subcutaneous TID WC  . insulin aspart  0-5 Units Subcutaneous QHS  . insulin detemir  10 Units Subcutaneous BID  . lamoTRIgine  100 mg Oral QHS  . levothyroxine  50 mcg Oral QAC breakfast  . LORazepam  0.5 mg Oral QHS  . multivitamin with minerals  1 tablet Oral Daily  . sodium chloride flush  3 mL Intravenous Q12H  Continuous Infusions: . sodium chloride Stopped (06/22/17 0458)  . dextrose 5 % and 0.45% NaCl 125 mL/hr at 06/22/17 0458  . insulin (NOVOLIN-R) infusion Stopped (06/22/17 0754)  . sodium chloride Stopped (06/21/17 2255)     LOS: 0 days     Alwyn RenElizabeth G Mathews, MD [facility]   If 7PM-7AM, please contact night-coverage [email or web address] Password [email or web address] 06/22/2017, 10:49 AM

## 2017-06-22 NOTE — ED Notes (Signed)
Placed call to ED Charge RN to inform that EVS has begun STAT clean on 1236. Will call back the moment room is ready.  Please pass along to Care RN as well.

## 2017-06-22 NOTE — Progress Notes (Signed)
STAT clean complete on 1236.

## 2017-06-22 NOTE — ED Notes (Signed)
Called at 0225 and received report from De WittBobby, [place].

## 2017-06-22 NOTE — ED Notes (Signed)
Call to floor room 1241 not assigned at this time

## 2017-06-22 NOTE — ED Notes (Signed)
OK'd per Consulting civil engineerCharge RN and AC. Due to pt's h/o dementia, pt's room assignment changing from 1241 to 1236 in order for pt to be placed in room with line of sight to nursing station. Called ED to notify of room change. RN not available, but left message with NS to give to RN. Called EVS and stat clean placed on 1236.  Will call back to ED as soon as room is cleaned and ready for pt.

## 2017-06-22 NOTE — H&P (Signed)
History and Physical    Beth Aristahomasine C Lafave ZOX:[id number] DOB: 06/08/1940 DOA: 06/21/2017  Referring MD/NP/PA: Dr. Pricilla LovelessScott Goldston PCP: Adrian PrinceSouth, Stephen, MD  Patient coming from: [facility] via EMS  Chief Complaint: Elevated blood sugar  I have personally briefly reviewed patient's old medical records in [facility]   HPI: Beth Payne is a 77 y.o. female with medical history significant of brittle DM type I, dementia, TIA, MI, CAD, hypothyroidism, fibromyalgia, chronic pain, and GERD; who presented from nursing facility for complaints of elevated blood sugars.  History is mostly obtained from review of records as patient has dementia.  Patient was reportedly  given her nighttime dose of Levemir prior to transport.  Patient does complain of some abdominal pain.  Review of records shows patient with multiple admissions in the past for DKA.  Last admitted on 05/13/17-05/18/17 for the same.  En route with EMS patient given 500 mL of normal saline and CBG noted to be 575.  ED Course: Upon admission into the emergency department patient was noted to have a temperature of 99.9 F, pulse 98-99, respirations 18, blood pressure 136/87- 192/140, and O2 saturations maintained on room air.  Labs revealed WBC 12.2, hemoglobin 10.1, platelets 258, sodium 136, potassium 3.9, chloride 17, BUN 29, creatinine 1.34, and glucose 323.  Patient was given 1.5 L of normal saline IV fluids, then started on glucose stabilizer protocol.  Review of Systems  Unable to perform ROS: Dementia    Past Medical History:  Diagnosis Date  . Anxiety   . Arthritis    "fingers" (12/23/2014)  . Chronic lower back pain   . Coronary artery disease   . Dementia   . Depression   . Fibromyalgia   . Fracture, ankle 12/23/2014  . GERD (gastroesophageal reflux disease) dx'd 1995  . Heart murmur   . NSTEMI (non-ST elevated myocardial infarction) (HCC) 12/13/2014  . Osteopenia dx'd 2007  . Retinopathy   . TIA (transient ischemic  attack)   . Type I diabetes mellitus (HCC) dx'd 1953   "I'm on a pup" (12/23/2014)    Past Surgical History:  Procedure Laterality Date  . ABDOMINAL HYSTERECTOMY  1972  . ANTERIOR CERVICAL DECOMP/DISCECTOMY FUSION  05/2003   "C5-6"  . APPENDECTOMY    . BACK SURGERY    . BILATERAL CARPAL TUNNEL RELEASE Bilateral 2003-2004  . BUNIONECTOMY WITH HAMMERTOE RECONSTRUCTION Right 02/1999  . CATARACT EXTRACTION W/ INTRAOCULAR LENS  IMPLANT, BILATERAL Bilateral 1998  . CERVICAL DISC SURGERY  2004   "collapsed C-5"  . CESAREAN SECTION    . CORONARY ANGIOPLASTY WITH STENT PLACEMENT  07/1999   "?1"  . DILATION AND CURETTAGE OF UTERUS    . EXPLORATORY LAPAROTOMY  1975-1987 X 1   "adhesions"  . EYE SURGERY Bilateral since 1977   "numerous slaser surgeries for retinopathy"  . FRACTURE SURGERY    . LAPAROSCOPIC LYSIS OF ADHESIONS  [phone] X 5  . LUMBAR MICRODISCECTOMY  03/2005  . OOPHORECTOMY Bilateral 1980's  . ORIF ANKLE FRACTURE Left 12/25/2014   Procedure: OPEN REDUCTION INTERNAL FIXATION (ORIF) LEFT TRIMALLEOLAR ANKLE FRACTURE;  Surgeon: Tarry KosNaiping M Xu, MD;  Location: [facility] OR;  Service: Orthopedics;  Laterality: Left;  . PATELLA FRACTURE SURGERY Left 04/2007  . TONSILLECTOMY    . TRIGGER FINGER RELEASE Bilateral 2003  . VERTEBROPLASTY  01/2008   "compressed lumbar fracture"  . VITRECTOMY Right 08/1997     reports that she has quit smoking. Her smoking use included cigarettes. She has a 27.00  pack-year smoking history. she has never used smokeless tobacco. She reports that she does not drink alcohol or use drugs.  Allergies  Allergen Reactions  . Diclofenac Sodium Swelling and Other (See Comments)    Blurred vision  . Doxycycline Calcium Swelling and Other (See Comments)    Blurred vision  . Calcium-Containing Compounds Other (See Comments)    Calcium listed as an allergy on [facility] 08/10/15  . Codeine Nausea And Vomiting and Other (See Comments)    Dizziness   . Zocor [Simvastatin] Other  (See Comments)    Dizziness, weakness     Family History  Problem Relation Age of Onset  . Liver disease Sister   . Colon cancer Brother     Prior to Admission medications   Medication Sig Start Date End Date Taking? Authorizing Provider  amLODipine (NORVASC) 10 MG tablet Take 1 tablet (10 mg total) by mouth daily. 05/22/15  Yes Richarda Overlie, MD  ARIPiprazole (ABILIFY) 5 MG tablet Take 1 tablet (5 mg total) by mouth every morning. Patient taking differently: Take 5 mg by mouth daily.  04/28/15  Yes Ghimire, Werner Lean, MD  cholecalciferol (VITAMIN D) 1000 units tablet Take 2,000 Units by mouth daily.   Yes [provider]  donepezil (ARICEPT) 10 MG tablet Take 10 mg by mouth at bedtime.   Yes [provider]  ferrous sulfate 325 (65 FE) MG tablet Take 325 mg by mouth 2 (two) times daily.    Yes [provider]  glucagon (GLUCAGON EMERGENCY) 1 MG injection Inject 1 mg into the vein once as needed (low blood sugar).   Yes [provider]  insulin detemir (LEVEMIR) 100 UNIT/ML injection Inject 0.1 mLs (10 Units total) into the skin 2 (two) times daily. 05/18/17  Yes Dorothea Ogle, MD  insulin lispro (HUMALOG) 100 UNIT/ML injection Inject 2 Units into the skin 3 (three) times daily.    Yes [provider]  lamoTRIgine (LAMICTAL) 25 MG tablet Take 100 mg by mouth at bedtime.    Yes [provider]  levothyroxine (SYNTHROID, LEVOTHROID) 50 MCG tablet Take 50 mcg by mouth daily before breakfast.   Yes [provider]  LORazepam (ATIVAN) 0.5 MG tablet Take 1 tablet (0.5 mg total) by mouth at bedtime. Anxiety 05/17/17  Yes Dorothea Ogle, MD  magnesium hydroxide (MILK OF MAGNESIA) 400 MG/5ML suspension Take 30 mLs by mouth daily as needed for mild constipation.   Yes [provider]  Melatonin 3 MG TABS Take 3 mg by mouth at bedtime.   Yes [provider]  metoCLOPramide (REGLAN) 5 MG tablet Take 1 tablet (5 mg total)  by mouth 3 (three) times daily before meals. Patient taking differently: Take 5 mg by mouth 3 (three) times daily before meals. For Gastroparesis 11/24/15  Yes Rama, Maryruth Bun, MD  ondansetron (ZOFRAN) 4 MG tablet Take 4 mg by mouth every 6 (six) hours as needed for nausea or vomiting.   Yes [provider]  pantoprazole (PROTONIX) 40 MG tablet Take 1 tablet (40 mg total) by mouth 2 (two) times daily. 04/28/15  Yes Ghimire, Werner Lean, MD  polyethylene glycol (MIRALAX / GLYCOLAX) packet Take 17 g by mouth daily. Patient taking differently: Take 17 g by mouth daily. Mix in 8 oz water and drink 11/24/15  Yes Rama, Maryruth Bun, MD  prazosin (MINIPRESS) 1 MG capsule Take 1 mg by mouth at bedtime.    Yes [provider]  promethazine (PHENERGAN) 25 MG/ML injection  Inject 12.5 mg into the vein every 6 (six) hours as needed for nausea or vomiting.   Yes [provider]  rosuvastatin (CRESTOR) 5 MG tablet Take 5 mg by mouth daily at 6 PM.    Yes [provider]  sertraline (ZOLOFT) 50 MG tablet Take 50 mg by mouth 2 (two) times daily.    Yes [provider]  insulin aspart (NOVOLOG) 100 UNIT/ML injection Inject at bedtime based upon sugars.  Sugar 0-120--no units; 121-150--1 unit;  151-200--2 units;  201-250--3 units; 251-300--5 units;  301-350--7 units;  351-400--9 units Patient not taking: Reported on 06/21/2017 12/15/15   Catarina Hartshorn, MD    Physical Exam:  Constitutional: Frail elderly female who appears uncomfortable Vitals:   06/21/17 2056 06/21/17 2239 06/21/17 2300  BP: (!) 192/140 136/87   Pulse: 98 99   Resp: 18 18   Temp:  98.9 F (37.2 C) 99.9 F (37.7 C)  TempSrc:  Oral Rectal  SpO2: 99% 98%    Eyes: PERRL, lids and conjunctivae normal ENMT: Mucous membranes are dry. Posterior pharynx clear of any exudate or lesions. .  Neck: normal, supple, no masses, no thyromegaly Respiratory: clear to auscultation bilaterally, no wheezing, no crackles.  Normal respiratory effort. No accessory muscle use.  Cardiovascular: Regular rate and rhythm, no murmurs / rubs / gallops. No extremity edema. 2+ pedal pulses. No carotid bruits.  Abdomen: Mild tenderness with palpation of her abdomen, no masses palpated. No hepatosplenomegaly. Bowel sounds positive.  Musculoskeletal: no clubbing / cyanosis. No joint deformity upper and lower extremities. Good ROM, no contractures. Normal muscle tone.  Skin: no rashes, lesions, ulcers. No induration Neurologic: CN 2-12 grossly intact. Sensation intact, DTR normal. Strength 5/5 in all 4.  Psychiatric: Normal judgment and insight. Alert and oriented x 3. Normal mood.     Labs on Admission: I have personally reviewed following labs and imaging studies  CBC: Recent Labs  Lab 06/21/17 2225  WBC 12.2*  NEUTROABS 10.6*  HGB 10.1*  HCT 30.4*  MCV 83.7  PLT 250   Basic Metabolic Panel: Recent Labs  Lab 06/21/17 2225  NA 136  K 3.9  CL 100*  CO2 17*  GLUCOSE 323*  BUN 29*  CREATININE 1.34*  CALCIUM 10.1   GFR: CrCl cannot be calculated (Unknown ideal weight.). Liver Function Tests: No results for input(s): AST, ALT, ALKPHOS, BILITOT, PROT, ALBUMIN in the last 168 hours. No results for input(s): LIPASE, AMYLASE in the last 168 hours. No results for input(s): AMMONIA in the last 168 hours. Coagulation Profile: No results for input(s): INR, PROTIME in the last 168 hours. Cardiac Enzymes: No results for input(s): CKTOTAL, CKMB, CKMBINDEX, TROPONINI in the last 168 hours. BNP (last 3 results) No results for input(s): PROBNP in the last 8760 hours. HbA1C: No results for input(s): HGBA1C in the last 72 hours. CBG: Recent Labs  Lab 06/21/17 2048  GLUCAP 425*   Lipid Profile: No results for input(s): CHOL, HDL, LDLCALC, TRIG, CHOLHDL, LDLDIRECT in the last 72 hours. Thyroid Function Tests: No results for input(s): TSH, T4TOTAL, FREET4, T3FREE, THYROIDAB in the last 72 hours. Anemia Panel: No  results for input(s): VITAMINB12, FOLATE, FERRITIN, TIBC, IRON, RETICCTPCT in the last 72 hours. Urine analysis:    Component Value Date/Time   COLORURINE COLORLESS (A) 05/13/2017 2356   APPEARANCEUR CLEAR 05/13/2017 2356   LABSPEC 1.009 05/13/2017 2356   PHURINE 5.0 05/13/2017 2356   GLUCOSEU >=500 (A) 05/13/2017 2356   HGBUR SMALL (A) 05/13/2017 2356  BILIRUBINUR NEGATIVE 05/13/2017 2356   KETONESUR 80 (A) 05/13/2017 2356   PROTEINUR NEGATIVE 05/13/2017 2356   UROBILINOGEN 0.2 02/23/2015 1433   NITRITE NEGATIVE 05/13/2017 2356   LEUKOCYTESUR NEGATIVE 05/13/2017 2356   Sepsis Labs: No results found for this or any previous visit (from the past 240 hour(s)).   Radiological Exams on Admission: Dg Chest Portable 1 View  Result Date: 06/21/2017 CLINICAL DATA:  Elevated blood sugar EXAM: PORTABLE CHEST 1 VIEW COMPARISON:  05/14/2017 radiograph FINDINGS: Linear atelectasis at the left base. No focal consolidation or pleural effusion. Mild cardiomegaly. Aortic atherosclerosis. No pneumothorax. IMPRESSION: Low lung volumes with minimal atelectasis at the left base Electronically Signed   By: Jasmine Pang M.D.   On: 06/21/2017 22:51    EKG: Independently reviewed.  Sinus rhythm with QTC 506  Assessment/Plan DKA type I: Acute.  Initial blood glucose noted to be 323 with anion gap of 19.  Initial urinalysis was not obtained.  Patient was started on glucose stabilizer protocol with 20 mEq of potassium given.  Patient with history of gastroparesis for which she takes Reglan.. - Admit to a stepdown bed - Glucose stabilizer protocol initiated - BMPs every 4 hours x3 - Continue to monitor for anion gap closure - Hold Reglan   Leukocytosis:Acute. WBC elevated at 12.2 on admission.  Chest x-ray showing low lung volumes with atelectasis of the left lung base.  Urinalysis still pending. - Follow-up urinalysis and will treat if signs of infection - Recheck CBC in a.m.   Acute kidney injury:  Baseline creatinine previously noted to be 0.57, but patient presents with a creatinine of 1.34 and BUN 29.  Suspect prerenal cause of symptoms. - IV fluids as tolerated - Continue to monitor creatinine  Prolonged QTC: Initial QTC noted to be 506 on admission - Recheck EKG in a.m. - Holding QT prolonging medications  Anemia of chronic disease: Hemoglobin 10.1 on admission which appears near patient's baseline - Continue to monitor  Dementia - Continue Lamictal - Hold Aricept and Abilify  Hypothyroidism - Add on TSH - Continue levothyroxine  GERD - Hold Protonix  DVT prophylaxis: Lovenox Code Status: DNR Family Communication: Present at bedside Disposition Plan: TBD  Consults called: none Admission status: Inpatient  Clydie Braun MD [facility] Pager [phone]   If 7PM-7AM, please contact night-coverage [email or web address] Password [facility]  06/22/2017, 12:37 AM

## 2017-06-22 NOTE — Progress Notes (Addendum)
Patient sleeping at time of visit, CSW will attempt to see patient at later time.  [facility]. CSW following for discharge needs.   Vivi BarrackNicole Trayveon Beckford, Theresia MajorsLCSWA, MSW Clinical Social Worker  [phone] 06/22/2017  3:51 PM

## 2017-06-23 LAB — GLUCOSE, CAPILLARY
GLUCOSE-CAPILLARY: 15 mg/dL — AB (ref 65–99)
GLUCOSE-CAPILLARY: 149 mg/dL — AB (ref 65–99)
GLUCOSE-CAPILLARY: 66 mg/dL (ref 65–99)
GLUCOSE-CAPILLARY: 143 mg/dL — AB (ref 65–99)

## 2017-06-23 MED ORDER — ENSURE ENLIVE PO LIQD: 237.0000 mL | Freq: Two times a day (BID) | ORAL | 12 refills | Status: DC

## 2017-06-23 MED ORDER — DEXTROSE 50 % IV SOLN
INTRAVENOUS | Status: AC
  Administered 2017-06-23: 25 mL
  Filled 2017-06-23: qty 50

## 2017-06-23 MED ORDER — DEXTROSE 50 % IV SOLN
INTRAVENOUS | Status: AC
  Administered 2017-06-23: 50 mL
  Filled 2017-06-23: qty 50

## 2017-06-23 NOTE — Progress Notes (Signed)
Inpatient Diabetes Program Recommendations  AACE/ADA: New Consensus Statement on Inpatient Glycemic Control (2015)  Target Ranges:  Prepandial:   less than 140 mg/dL      Peak postprandial:   less than 180 mg/dL (1-2 hours)      Critically ill patients:  140 - 180 mg/dL   Results for Beth Payne, Beth Payne (MRN [id number]) as of 06/23/2017 08:33  Ref. Range 06/22/2017 12:11 06/22/2017 17:49 06/22/2017 20:00 06/22/2017 23:17  Glucose-Capillary Latest Ref Range: 65 - 99 mg/dL 409195 (H) 811101 (H) 914111 (H) 152 (H)   Results for Beth Payne, Beth Payne (MRN [id number]) as of 06/23/2017 08:33  Ref. Range 06/23/2017 08:01  Glucose-Capillary Latest Ref Range: 65 - 99 mg/dL 15 (LL)     SNF DM Meds: Levemir 10 units BID     Humalog 2 units TID  Current Insulin Orders: Levemir 10 units BID      Novolog Moderate Correction Scale/ SSI (0-15 units) TID AC + HS        MD- Note patient with severe Hypoglycemia this AM.  Received both doses of Levemir yesterday (01/16).    Please consider the following:  1. Reduce Levemir to 7 units BID (75% total home dose)  2. Reduce Novolog SSI to Sensitive scale (0-9 units) TID AC + HS      --Will follow patient during hospitalization--  Ambrose FinlandJeannine Johnston Mavrik Bynum RN, MSN, CDE Diabetes Coordinator Inpatient Glycemic Control Team Team Pager: [phone] (8a-5p)

## 2017-06-23 NOTE — Progress Notes (Signed)
[facility] [place] [facility]  All questions answered.

## 2017-06-23 NOTE — Progress Notes (Signed)
Hypoglycemic Event  CBG: 66  Treatment: 1/2 amp D5  Symptoms:   Follow-up CBG: Time: 1301 CBG Result: 143  Possible Reasons for Event:   Comments/MD notified yes via text page    Beth Payne

## 2017-06-23 NOTE — Discharge Summary (Addendum)
Physician Discharge Summary  Beth Payne:[id number] DOB: 31-Jul-1940 DOA: 06/21/2017  PCP: [name], MD  Admit date: 06/21/2017 Discharge date: 06/23/2017  Admitted From: NURSING HOME  Disposition:NURSING HOME Recommendations for Outpatient Follow-up:  1. Follow up with PCP in 1-2 wEKS 2. PATIENT TO BE FOLLOWED BY HOSPICE AT THE FACILITY 3. DO NOT HOSPITALIZE.DO NOT RETURN TO HOSPITAL. Equipment/Devices none Discharge Condition: Hospice CODE STATUS DNR Diet recommenda tion: Cardiac carb modified Brief/Interim Summary:77 y.o.femalewith medical history significant ofbrittle DM type I, dementia, TIA, MI, CAD, hypothyroidism, fibromyalgia, chronic pain, and GERD; who presented from nursing facility for complaints of elevated blood sugars. History is mostly obtained from review of records as patient has dementia. Patientwasreportedly given her nighttime dose of Levemir prior to transport. Patient does complain of some abdominal pain. Review of records shows patient with multiple admissions in the past for DKA. Last admitted on 05/13/17-12/12/18for the same. Enroute with EMS patient given 500 mL of normal saline and CBG noted to be 575.  ED Course:Upon admission into the emergency department patient was noted to have a temperature of 99.9 F, pulse 98-99, respirations 18, blood pressure 136/87- 192/140,and O2 saturations maintained on room air.Labs revealed WBC 12.2, hemoglobin 10.1, platelets 258, sodium 136, potassium 3.9, chloride 17, BUN 29, creatinine 1.34, and glucose 323. Patient was given 1.5 L of normal saline IV fluids, then started on glucose stabilizer protocol.    Discharge Diagnoses:  Principal Problem:   DKA, type 1 (HCC) Active Problems:   Dementia   Hypothyroidism   Prolonged Q-T interval on ECG   Anemia of chronic disease  1] DKA resolved.  Patient has had multiple admissions for DKA.  She is a type 1 diabetes.  This morning her blood sugar  dropped to 15.  This was partly because she was not eating or drinking as she should.  She does have a history of dementia and she does not follow commands and she remains confused most of the time.  I did call her POA her friend [name] [phone].  I did explain to him that her overall prognosis is guarded because of the history of dementia and type 1 diabetes along with several CAD's as well as TIA/CVA.  Patient has had multiple admissions as above for DKA.  He was in agreement to have hospice follow patient at the facility.  I would expect her life expectancy to be less than 6 months under the current circumstances.  I will be ordering hospice at the facility.  Discharge Instructions   Allergies as of 06/23/2017      Reactions   Diclofenac Sodium Swelling, Other (See Comments)   Blurred vision   Doxycycline Calcium Swelling, Other (See Comments)   Blurred vision   Calcium-containing Compounds Other (See Comments)   Calcium listed as an allergy on [facility] 08/10/15   Codeine Nausea And Vomiting, Other (See Comments)   Dizziness    Zocor [simvastatin] Other (See Comments)   Dizziness, weakness       Medication List    STOP taking these medications   cholecalciferol 1000 units tablet Commonly known as:  VITAMIN D   donepezil 10 MG tablet Commonly known as:  ARICEPT   ferrous sulfate 325 (65 FE) MG tablet   pantoprazole 40 MG tablet Commonly known as:  PROTONIX   PHENERGAN 25 MG/ML injection Generic drug:  promethazine   rosuvastatin 5 MG tablet Commonly known as:  CRESTOR     TAKE these medications   amLODipine 10  MG tablet Commonly known as:  NORVASC Take 1 tablet (10 mg total) by mouth daily.   ARIPiprazole 5 MG tablet Commonly known as:  ABILIFY Take 1 tablet (5 mg total) by mouth every morning. What changed:  when to take this   feeding supplement (ENSURE ENLIVE) Liqd Take 237 mLs by mouth 2 (two) times daily between meals.   GLUCAGON EMERGENCY 1 MG  injection Generic drug:  glucagon Inject 1 mg into the vein once as needed (low blood sugar).   insulin aspart 100 UNIT/ML injection Commonly known as:  novoLOG Inject at bedtime based upon sugars.  Sugar 0-120--no units; 121-150--1 unit;  151-200--2 units;  201-250--3 units; 251-300--5 units;  301-350--7 units;  351-400--9 units   insulin detemir 100 UNIT/ML injection Commonly known as:  LEVEMIR Inject 0.1 mLs (10 Units total) into the skin 2 (two) times daily.   insulin lispro 100 UNIT/ML injection Commonly known as:  HUMALOG Inject 2 Units into the skin 3 (three) times daily.   lamoTRIgine 25 MG tablet Commonly known as:  LAMICTAL Take 100 mg by mouth at bedtime.   levothyroxine 50 MCG tablet Commonly known as:  SYNTHROID, LEVOTHROID Take 50 mcg by mouth daily before breakfast.   LORazepam 0.5 MG tablet Commonly known as:  ATIVAN Take 1 tablet (0.5 mg total) by mouth at bedtime. Anxiety   magnesium hydroxide 400 MG/5ML suspension Commonly known as:  MILK OF MAGNESIA Take 30 mLs by mouth daily as needed for mild constipation.   Melatonin 3 MG Tabs Take 3 mg by mouth at bedtime.   metoCLOPramide 5 MG tablet Commonly known as:  REGLAN Take 1 tablet (5 mg total) by mouth 3 (three) times daily before meals. What changed:  additional instructions   ondansetron 4 MG tablet Commonly known as:  ZOFRAN Take 4 mg by mouth every 6 (six) hours as needed for nausea or vomiting.   polyethylene glycol packet Commonly known as:  MIRALAX / GLYCOLAX Take 17 g by mouth daily. What changed:  additional instructions   prazosin 1 MG capsule Commonly known as:  MINIPRESS Take 1 mg by mouth at bedtime.   sertraline 50 MG tablet Commonly known as:  ZOLOFT Take 50 mg by mouth 2 (two) times daily.       Contact information for follow-up providers    [name], MD Follow up.   Specialty:  Endocrinology Contact information: [address] [place]  [address] [phone]            Contact information for after-discharge care    Destination    [facility] Follow up.   Service:  Skilled Nursing Contact information: [address] [place] [address] [phone]                 Allergies  Allergen Reactions  . Diclofenac Sodium Swelling and Other (See Comments)    Blurred vision  . Doxycycline Calcium Swelling and Other (See Comments)    Blurred vision  . Calcium-Containing Compounds Other (See Comments)    Calcium listed as an allergy on [facility] 08/10/15  . Codeine Nausea And Vomiting and Other (See Comments)    Dizziness   . Zocor [Simvastatin] Other (See Comments)    Dizziness, weakness     Consultations: NONE Procedures/Studies: Dg Chest Portable 1 View  Result Date: 06/21/2017 CLINICAL DATA:  Elevated blood sugar EXAM: PORTABLE CHEST 1 VIEW COMPARISON:  05/14/2017 radiograph FINDINGS: Linear atelectasis at the left base. No focal consolidation or pleural  effusion. Mild cardiomegaly. Aortic atherosclerosis. No pneumothorax. IMPRESSION: Low lung volumes with minimal atelectasis at the left base Electronically Signed   By: Jasmine Pang M.D.   On: 06/21/2017 22:51    (Echo, Carotid, EGD, Colonoscopy, ERCP)    Subjective:   Discharge Exam: Vitals:   06/22/17 2326 06/23/17 0510  BP: (!) 142/59 (!) 103/53  Pulse:  81  Resp:  16  Temp:  98 F (36.7 C)  SpO2:  100%   Vitals:   06/22/17 1700 06/22/17 2001 06/22/17 2326 06/23/17 0510  BP: (!) 163/57 (!) 161/65 (!) 142/59 (!) 103/53  Pulse: 81 77  81  Resp: 17 16  16   Temp:  98.8 F (37.1 C)  98 F (36.7 C)  TempSrc:  Axillary  Oral  SpO2: 98% 97%  100%  Weight:      Height:        General: Pt is alert, awake, not in acute distress Cardiovascular: RRR, S1/S2 +, no rubs, no gallops Respiratory: CTA bilaterally, no wheezing, no rhonchi Abdominal: Soft, NT, ND, bowel sounds + Extremities: no edema, no cyanosis    The results  of significant diagnostics from this hospitalization (including imaging, microbiology, ancillary and laboratory) are listed below for reference.     Microbiology: Recent Results (from the past 240 hour(s))  MRSA PCR Screening     Status: None   Collection Time: 06/22/17  3:23 AM  Result Value Ref Range Status   MRSA by PCR NEGATIVE NEGATIVE Final    Comment:        The GeneXpert MRSA Assay (FDA approved for NASAL specimens only), is one component of a comprehensive MRSA colonization surveillance program. It is not intended to diagnose MRSA infection nor to guide or monitor treatment for MRSA infections.      Labs: BNP (last 3 results) No results for input(s): BNP in the last 8760 hours. Basic Metabolic Panel: Recent Labs  Lab 06/21/17 2225 06/22/17 0435 06/22/17 0714  NA 136 138 137  K 3.9 4.0 3.9  CL 100* 108 107  CO2 17* 19* 20*  GLUCOSE 323* 186* 130*  BUN 29* 19 18  CREATININE 1.34* 0.94 0.79  CALCIUM 10.1 9.9 9.6  MG  --  1.9  --    Liver Function Tests: No results for input(s): AST, ALT, ALKPHOS, BILITOT, PROT, ALBUMIN in the last 168 hours. No results for input(s): LIPASE, AMYLASE in the last 168 hours. No results for input(s): AMMONIA in the last 168 hours. CBC: Recent Labs  Lab 06/21/17 2225 06/22/17 0714  WBC 12.2* 11.8*  NEUTROABS 10.6* 9.2*  HGB 10.1* 9.6*  HCT 30.4* 28.7*  MCV 83.7 82.0  PLT 250 222   Cardiac Enzymes: No results for input(s): CKTOTAL, CKMB, CKMBINDEX, TROPONINI in the last 168 hours. BNP: Invalid input(s): POCBNP CBG: Recent Labs  Lab 06/22/17 1749 06/22/17 2000 06/22/17 2317 06/23/17 0801 06/23/17 0840  GLUCAP 101* 111* 152* 15* 149*   D-Dimer No results for input(s): DDIMER in the last 72 hours. Hgb A1c No results for input(s): HGBA1C in the last 72 hours. Lipid Profile No results for input(s): CHOL, HDL, LDLCALC, TRIG, CHOLHDL, LDLDIRECT in the last 72 hours. Thyroid function studies Recent Labs     06/22/17 0435  TSH 0.365   Anemia work up No results for input(s): VITAMINB12, FOLATE, FERRITIN, TIBC, IRON, RETICCTPCT in the last 72 hours. Urinalysis    Component Value Date/Time   COLORURINE YELLOW 06/22/2017 0045   APPEARANCEUR CLEAR 06/22/2017 0045  LABSPEC 1.014 06/22/2017 0045   PHURINE 5.0 06/22/2017 0045   GLUCOSEU >=500 (A) 06/22/2017 0045   HGBUR SMALL (A) 06/22/2017 0045   BILIRUBINUR NEGATIVE 06/22/2017 0045   KETONESUR 80 (A) 06/22/2017 0045   PROTEINUR NEGATIVE 06/22/2017 0045   UROBILINOGEN 0.2 02/23/2015 1433   NITRITE NEGATIVE 06/22/2017 0045   LEUKOCYTESUR NEGATIVE 06/22/2017 0045   Sepsis Labs Invalid input(s): PROCALCITONIN,  WBC,  LACTICIDVEN Microbiology Recent Results (from the past 240 hour(s))  MRSA PCR Screening     Status: None   Collection Time: 06/22/17  3:23 AM  Result Value Ref Range Status   MRSA by PCR NEGATIVE NEGATIVE Final    Comment:        The GeneXpert MRSA Assay (FDA approved for NASAL specimens only), is one component of a comprehensive MRSA colonization surveillance program. It is not intended to diagnose MRSA infection nor to guide or monitor treatment for MRSA infections.      Time coordinating discharge: Over 30 minutes  SIGNED:   Alwyn RenElizabeth G Nery Kalisz, MD  [facility] 06/23/2017, 10:19 AM Pager   If 7PM-7AM, please contact night-coverage [email or web address] Password [email or web address]

## 2017-06-23 NOTE — Progress Notes (Signed)
Pt is resident of SNF- [facility]. Pt and POA known to CSW service line from previous admissions. See last complete assessment from 05/2017 below, no significant changes in psychosocial situation other than that pt now returning with recommendation for hospice services to follow. Facility aware. Pt's POA Beth doing paperwork for her readmission this afternoon. Will arrange PTAR transport back to [facility] 3:30pm.  Report [phone] room 401  Beth Payne, MSW, LCSW Clinical Social Work 06/23/2017 [phone]   Clinical Social Work Assessment  Patient Details  Name: Beth Payne MRN: [id number] Date of Birth: 10/08/1940  Date of referral:  05/16/17               Reason for consult:  Facility Placement                 Permission sought to share information with:  Family Supports, Oceanographeracility Contact Representative Permission granted to share information::                Name::                   Agency::  [facility]             Relationship::  Product managerriend/HCPOA             Contact Information:   Beth  Housing/Transportation Living arrangements for the past 2 months:  Skilled Building surveyorursing Facility Source of Information:  Patient Patient Interpreter Needed:  None Criminal Activity/Legal Involvement Pertinent to Current Situation/Hospitalization:  No - Comment as needed Significant Relationships:  Friend, Adult Children Lives with:  Self Do you feel safe going back to the place where you live?  Yes Need for family participation in patient care:  No (Coment)  Care giving concerns: Patient has medical history of DM type I and Dementia. She was admitted from SNF for Hypoglycemia.  No care giving concerns presented by patient or family. [facility] and will return at discharge.    Social Worker assessment / plan:  CSW discussed discharge plan to back to SNF. Patient reports she has been living there for a few months. She reports  her family is involved in care but her friend Beth BernardRon Payne is her HCPOA.  Patient CSW permission to update him about her care.  Facility will accept patient at d/c  Plan: Assist with discharge to SNF.   Employment status:  Retired Health and safety inspectornsurance information:  Medicare PT Recommendations:  Skilled Nursing Facility Information / Referral to community resources:  Skilled Nursing Facility  Patient/Family's Response to care:  Agreeable and Responding to care.   Patient/Family's Understanding of and Emotional Response to Diagnosis, Current Treatment, and Prognosis: Patient has history of Dementia, she reports her children are involved in her care and Beth her friend is HCPOA.   Emotional Assessment Appearance:  Appears stated age Attitude/Demeanor/Rapport:    Affect (typically observed):  Accepting Orientation:  Oriented to Self, Oriented to Place, Oriented to Situation Alcohol / Substance use:  Not Applicable Psych involvement (Current and /or in the community):  No (Comment)  Discharge Needs  Concerns to be addressed:  Discharge Planning Concerns Readmission within the last 30 days:  No Current discharge risk:  Dependent with Mobility Barriers to Discharge:  No Barriers Identified   Clearance CootsNicole A Sinclair, LCSW 05/16/2017, 3:04 PM

## 2017-06-23 NOTE — Progress Notes (Signed)
Hypoglycemic Event  CBG: 15  Treatment: dextrose 50% IV  Symptoms:   Follow-up CBG: Time: 0840 CBG Result: 149  Possible Reasons for Event:   Comments/MD notified yes, via text page    Beth Payne

## 2017-06-24 DIAGNOSIS — Z79899 Other long term (current) drug therapy: Secondary | ICD-10-CM | POA: Diagnosis not present

## 2017-06-24 DIAGNOSIS — E039 Hypothyroidism, unspecified: Secondary | ICD-10-CM | POA: Diagnosis not present

## 2017-06-24 DIAGNOSIS — E785 Hyperlipidemia, unspecified: Secondary | ICD-10-CM | POA: Diagnosis not present

## 2017-06-24 DIAGNOSIS — E559 Vitamin D deficiency, unspecified: Secondary | ICD-10-CM | POA: Diagnosis not present

## 2017-06-24 DIAGNOSIS — D649 Anemia, unspecified: Secondary | ICD-10-CM | POA: Diagnosis not present

## 2017-06-24 DIAGNOSIS — E1010 Type 1 diabetes mellitus with ketoacidosis without coma: Secondary | ICD-10-CM | POA: Diagnosis not present

## 2017-06-24 DIAGNOSIS — F329 Major depressive disorder, single episode, unspecified: Secondary | ICD-10-CM | POA: Diagnosis not present

## 2017-06-24 DIAGNOSIS — E119 Type 2 diabetes mellitus without complications: Secondary | ICD-10-CM | POA: Diagnosis not present

## 2017-06-24 DIAGNOSIS — F0390 Unspecified dementia without behavioral disturbance: Secondary | ICD-10-CM | POA: Diagnosis not present

## 2017-06-24 DIAGNOSIS — I10 Essential (primary) hypertension: Secondary | ICD-10-CM | POA: Diagnosis not present

## 2017-06-24 LAB — URINE CULTURE: Culture: NO GROWTH

## 2017-06-27 DIAGNOSIS — G40909 Epilepsy, unspecified, not intractable, without status epilepticus: Secondary | ICD-10-CM | POA: Diagnosis not present

## 2017-06-27 DIAGNOSIS — E1010 Type 1 diabetes mellitus with ketoacidosis without coma: Secondary | ICD-10-CM | POA: Diagnosis not present

## 2017-06-27 DIAGNOSIS — R1110 Vomiting, unspecified: Secondary | ICD-10-CM | POA: Diagnosis not present

## 2017-06-27 DIAGNOSIS — I4581 Long QT syndrome: Secondary | ICD-10-CM | POA: Diagnosis not present

## 2017-06-29 DIAGNOSIS — N179 Acute kidney failure, unspecified: Secondary | ICD-10-CM | POA: Diagnosis not present

## 2017-06-29 DIAGNOSIS — E039 Hypothyroidism, unspecified: Secondary | ICD-10-CM | POA: Diagnosis not present

## 2017-06-29 DIAGNOSIS — F411 Generalized anxiety disorder: Secondary | ICD-10-CM | POA: Diagnosis not present

## 2017-06-29 DIAGNOSIS — R1319 Other dysphagia: Secondary | ICD-10-CM | POA: Diagnosis not present

## 2017-06-29 DIAGNOSIS — R41841 Cognitive communication deficit: Secondary | ICD-10-CM | POA: Diagnosis not present

## 2017-06-29 DIAGNOSIS — M6281 Muscle weakness (generalized): Secondary | ICD-10-CM | POA: Diagnosis not present

## 2017-06-29 DIAGNOSIS — R2689 Other abnormalities of gait and mobility: Secondary | ICD-10-CM | POA: Diagnosis not present

## 2017-06-29 DIAGNOSIS — Z66 Do not resuscitate: Secondary | ICD-10-CM | POA: Diagnosis not present

## 2017-06-29 DIAGNOSIS — F0390 Unspecified dementia without behavioral disturbance: Secondary | ICD-10-CM | POA: Diagnosis not present

## 2017-06-29 DIAGNOSIS — R278 Other lack of coordination: Secondary | ICD-10-CM | POA: Diagnosis not present

## 2017-06-29 DIAGNOSIS — E1022 Type 1 diabetes mellitus with diabetic chronic kidney disease: Secondary | ICD-10-CM | POA: Diagnosis not present

## 2017-06-29 DIAGNOSIS — F331 Major depressive disorder, recurrent, moderate: Secondary | ICD-10-CM | POA: Diagnosis not present

## 2017-06-29 DIAGNOSIS — E7800 Pure hypercholesterolemia, unspecified: Secondary | ICD-10-CM | POA: Diagnosis not present

## 2017-06-30 DIAGNOSIS — M6281 Muscle weakness (generalized): Secondary | ICD-10-CM | POA: Diagnosis not present

## 2017-06-30 DIAGNOSIS — R278 Other lack of coordination: Secondary | ICD-10-CM | POA: Diagnosis not present

## 2017-06-30 DIAGNOSIS — N179 Acute kidney failure, unspecified: Secondary | ICD-10-CM | POA: Diagnosis not present

## 2017-06-30 DIAGNOSIS — F0390 Unspecified dementia without behavioral disturbance: Secondary | ICD-10-CM | POA: Diagnosis not present

## 2017-06-30 DIAGNOSIS — R2689 Other abnormalities of gait and mobility: Secondary | ICD-10-CM | POA: Diagnosis not present

## 2017-06-30 DIAGNOSIS — R41841 Cognitive communication deficit: Secondary | ICD-10-CM | POA: Diagnosis not present

## 2017-07-01 DIAGNOSIS — G40909 Epilepsy, unspecified, not intractable, without status epilepticus: Secondary | ICD-10-CM | POA: Diagnosis not present

## 2017-07-01 DIAGNOSIS — K219 Gastro-esophageal reflux disease without esophagitis: Secondary | ICD-10-CM | POA: Diagnosis not present

## 2017-07-01 DIAGNOSIS — E1010 Type 1 diabetes mellitus with ketoacidosis without coma: Secondary | ICD-10-CM | POA: Diagnosis not present

## 2017-07-01 DIAGNOSIS — R1110 Vomiting, unspecified: Secondary | ICD-10-CM | POA: Diagnosis not present

## 2017-07-02 DIAGNOSIS — M6281 Muscle weakness (generalized): Secondary | ICD-10-CM | POA: Diagnosis not present

## 2017-07-02 DIAGNOSIS — R2689 Other abnormalities of gait and mobility: Secondary | ICD-10-CM | POA: Diagnosis not present

## 2017-07-02 DIAGNOSIS — F0390 Unspecified dementia without behavioral disturbance: Secondary | ICD-10-CM | POA: Diagnosis not present

## 2017-07-02 DIAGNOSIS — N179 Acute kidney failure, unspecified: Secondary | ICD-10-CM | POA: Diagnosis not present

## 2017-07-02 DIAGNOSIS — R278 Other lack of coordination: Secondary | ICD-10-CM | POA: Diagnosis not present

## 2017-07-02 DIAGNOSIS — R41841 Cognitive communication deficit: Secondary | ICD-10-CM | POA: Diagnosis not present

## 2017-07-04 DIAGNOSIS — R278 Other lack of coordination: Secondary | ICD-10-CM | POA: Diagnosis not present

## 2017-07-04 DIAGNOSIS — N179 Acute kidney failure, unspecified: Secondary | ICD-10-CM | POA: Diagnosis not present

## 2017-07-04 DIAGNOSIS — F0390 Unspecified dementia without behavioral disturbance: Secondary | ICD-10-CM | POA: Diagnosis not present

## 2017-07-04 DIAGNOSIS — R2689 Other abnormalities of gait and mobility: Secondary | ICD-10-CM | POA: Diagnosis not present

## 2017-07-04 DIAGNOSIS — R41841 Cognitive communication deficit: Secondary | ICD-10-CM | POA: Diagnosis not present

## 2017-07-04 DIAGNOSIS — M6281 Muscle weakness (generalized): Secondary | ICD-10-CM | POA: Diagnosis not present

## 2017-07-05 DIAGNOSIS — R278 Other lack of coordination: Secondary | ICD-10-CM | POA: Diagnosis not present

## 2017-07-05 DIAGNOSIS — M6281 Muscle weakness (generalized): Secondary | ICD-10-CM | POA: Diagnosis not present

## 2017-07-05 DIAGNOSIS — R41841 Cognitive communication deficit: Secondary | ICD-10-CM | POA: Diagnosis not present

## 2017-07-05 DIAGNOSIS — G4700 Insomnia, unspecified: Secondary | ICD-10-CM | POA: Diagnosis not present

## 2017-07-05 DIAGNOSIS — F0390 Unspecified dementia without behavioral disturbance: Secondary | ICD-10-CM | POA: Diagnosis not present

## 2017-07-05 DIAGNOSIS — F411 Generalized anxiety disorder: Secondary | ICD-10-CM | POA: Diagnosis not present

## 2017-07-05 DIAGNOSIS — R2689 Other abnormalities of gait and mobility: Secondary | ICD-10-CM | POA: Diagnosis not present

## 2017-07-05 DIAGNOSIS — F331 Major depressive disorder, recurrent, moderate: Secondary | ICD-10-CM | POA: Diagnosis not present

## 2017-07-05 DIAGNOSIS — N179 Acute kidney failure, unspecified: Secondary | ICD-10-CM | POA: Diagnosis not present

## 2017-07-06 DIAGNOSIS — N179 Acute kidney failure, unspecified: Secondary | ICD-10-CM | POA: Diagnosis not present

## 2017-07-06 DIAGNOSIS — R2689 Other abnormalities of gait and mobility: Secondary | ICD-10-CM | POA: Diagnosis not present

## 2017-07-06 DIAGNOSIS — G4700 Insomnia, unspecified: Secondary | ICD-10-CM | POA: Diagnosis not present

## 2017-07-06 DIAGNOSIS — R278 Other lack of coordination: Secondary | ICD-10-CM | POA: Diagnosis not present

## 2017-07-06 DIAGNOSIS — M6281 Muscle weakness (generalized): Secondary | ICD-10-CM | POA: Diagnosis not present

## 2017-07-06 DIAGNOSIS — R41841 Cognitive communication deficit: Secondary | ICD-10-CM | POA: Diagnosis not present

## 2017-07-06 DIAGNOSIS — F0390 Unspecified dementia without behavioral disturbance: Secondary | ICD-10-CM | POA: Diagnosis not present

## 2017-07-06 DIAGNOSIS — F411 Generalized anxiety disorder: Secondary | ICD-10-CM | POA: Diagnosis not present

## 2017-07-06 DIAGNOSIS — F331 Major depressive disorder, recurrent, moderate: Secondary | ICD-10-CM | POA: Diagnosis not present

## 2017-07-07 DIAGNOSIS — R278 Other lack of coordination: Secondary | ICD-10-CM | POA: Diagnosis not present

## 2017-07-07 DIAGNOSIS — F0390 Unspecified dementia without behavioral disturbance: Secondary | ICD-10-CM | POA: Diagnosis not present

## 2017-07-07 DIAGNOSIS — R41841 Cognitive communication deficit: Secondary | ICD-10-CM | POA: Diagnosis not present

## 2017-07-07 DIAGNOSIS — N179 Acute kidney failure, unspecified: Secondary | ICD-10-CM | POA: Diagnosis not present

## 2017-07-07 DIAGNOSIS — R2689 Other abnormalities of gait and mobility: Secondary | ICD-10-CM | POA: Diagnosis not present

## 2017-07-07 DIAGNOSIS — M6281 Muscle weakness (generalized): Secondary | ICD-10-CM | POA: Diagnosis not present

## 2017-07-08 DIAGNOSIS — R2689 Other abnormalities of gait and mobility: Secondary | ICD-10-CM | POA: Diagnosis not present

## 2017-07-08 DIAGNOSIS — R41841 Cognitive communication deficit: Secondary | ICD-10-CM | POA: Diagnosis not present

## 2017-07-08 DIAGNOSIS — N179 Acute kidney failure, unspecified: Secondary | ICD-10-CM | POA: Diagnosis not present

## 2017-07-08 DIAGNOSIS — F0390 Unspecified dementia without behavioral disturbance: Secondary | ICD-10-CM | POA: Diagnosis not present

## 2017-07-08 DIAGNOSIS — M6281 Muscle weakness (generalized): Secondary | ICD-10-CM | POA: Diagnosis not present

## 2017-07-08 DIAGNOSIS — R1319 Other dysphagia: Secondary | ICD-10-CM | POA: Diagnosis not present

## 2017-07-08 DIAGNOSIS — R278 Other lack of coordination: Secondary | ICD-10-CM | POA: Diagnosis not present

## 2017-07-11 DIAGNOSIS — N179 Acute kidney failure, unspecified: Secondary | ICD-10-CM | POA: Diagnosis not present

## 2017-07-11 DIAGNOSIS — M6281 Muscle weakness (generalized): Secondary | ICD-10-CM | POA: Diagnosis not present

## 2017-07-11 DIAGNOSIS — R41841 Cognitive communication deficit: Secondary | ICD-10-CM | POA: Diagnosis not present

## 2017-07-11 DIAGNOSIS — R278 Other lack of coordination: Secondary | ICD-10-CM | POA: Diagnosis not present

## 2017-07-11 DIAGNOSIS — R2689 Other abnormalities of gait and mobility: Secondary | ICD-10-CM | POA: Diagnosis not present

## 2017-07-11 DIAGNOSIS — F0390 Unspecified dementia without behavioral disturbance: Secondary | ICD-10-CM | POA: Diagnosis not present

## 2017-07-12 DIAGNOSIS — K219 Gastro-esophageal reflux disease without esophagitis: Secondary | ICD-10-CM | POA: Diagnosis not present

## 2017-07-12 DIAGNOSIS — G40909 Epilepsy, unspecified, not intractable, without status epilepticus: Secondary | ICD-10-CM | POA: Diagnosis not present

## 2017-07-12 DIAGNOSIS — I10 Essential (primary) hypertension: Secondary | ICD-10-CM | POA: Diagnosis not present

## 2017-07-12 DIAGNOSIS — E108 Type 1 diabetes mellitus with unspecified complications: Secondary | ICD-10-CM | POA: Diagnosis not present

## 2017-07-18 DIAGNOSIS — R531 Weakness: Secondary | ICD-10-CM | POA: Diagnosis not present

## 2017-07-21 DIAGNOSIS — R531 Weakness: Secondary | ICD-10-CM | POA: Diagnosis not present

## 2017-07-26 DIAGNOSIS — F411 Generalized anxiety disorder: Secondary | ICD-10-CM | POA: Diagnosis not present

## 2017-07-26 DIAGNOSIS — F331 Major depressive disorder, recurrent, moderate: Secondary | ICD-10-CM | POA: Diagnosis not present

## 2017-07-26 DIAGNOSIS — G4700 Insomnia, unspecified: Secondary | ICD-10-CM | POA: Diagnosis not present

## 2017-07-26 DIAGNOSIS — F0390 Unspecified dementia without behavioral disturbance: Secondary | ICD-10-CM | POA: Diagnosis not present

## 2017-07-27 DIAGNOSIS — F0390 Unspecified dementia without behavioral disturbance: Secondary | ICD-10-CM | POA: Diagnosis not present

## 2017-07-27 DIAGNOSIS — F331 Major depressive disorder, recurrent, moderate: Secondary | ICD-10-CM | POA: Diagnosis not present

## 2017-07-27 DIAGNOSIS — G4700 Insomnia, unspecified: Secondary | ICD-10-CM | POA: Diagnosis not present

## 2017-07-27 DIAGNOSIS — F411 Generalized anxiety disorder: Secondary | ICD-10-CM | POA: Diagnosis not present

## 2017-08-01 ENCOUNTER — Emergency Department (HOSPITAL_COMMUNITY): Payer: Medicare Other

## 2017-08-01 ENCOUNTER — Inpatient Hospital Stay (HOSPITAL_COMMUNITY)
Admission: EM | Admit: 2017-08-01 | Discharge: 2017-08-03 | DRG: 637 | Disposition: A | Discharge status: Skilled Nursing Facility | Payer: Medicare Other | Attending: Internal Medicine | Admitting: Internal Medicine

## 2017-08-01 ENCOUNTER — Encounter (HOSPITAL_COMMUNITY): Payer: Self-pay | Admitting: Emergency Medicine

## 2017-08-01 ENCOUNTER — Other Ambulatory Visit: Payer: Self-pay

## 2017-08-01 DIAGNOSIS — F329 Major depressive disorder, single episode, unspecified: Secondary | ICD-10-CM | POA: Diagnosis present

## 2017-08-01 DIAGNOSIS — E43 Unspecified severe protein-calorie malnutrition: Secondary | ICD-10-CM | POA: Diagnosis present

## 2017-08-01 DIAGNOSIS — Z79899 Other long term (current) drug therapy: Secondary | ICD-10-CM

## 2017-08-01 DIAGNOSIS — K922 Gastrointestinal hemorrhage, unspecified: Secondary | ICD-10-CM

## 2017-08-01 DIAGNOSIS — Z87891 Personal history of nicotine dependence: Secondary | ICD-10-CM | POA: Diagnosis not present

## 2017-08-01 DIAGNOSIS — Z961 Presence of intraocular lens: Secondary | ICD-10-CM | POA: Diagnosis present

## 2017-08-01 DIAGNOSIS — F0390 Unspecified dementia without behavioral disturbance: Secondary | ICD-10-CM | POA: Diagnosis not present

## 2017-08-01 DIAGNOSIS — Z9842 Cataract extraction status, left eye: Secondary | ICD-10-CM

## 2017-08-01 DIAGNOSIS — Z66 Do not resuscitate: Secondary | ICD-10-CM | POA: Diagnosis present

## 2017-08-01 DIAGNOSIS — I10 Essential (primary) hypertension: Secondary | ICD-10-CM | POA: Diagnosis present

## 2017-08-01 DIAGNOSIS — K219 Gastro-esophageal reflux disease without esophagitis: Secondary | ICD-10-CM | POA: Diagnosis present

## 2017-08-01 DIAGNOSIS — I2510 Atherosclerotic heart disease of native coronary artery without angina pectoris: Secondary | ICD-10-CM | POA: Diagnosis present

## 2017-08-01 DIAGNOSIS — R1110 Vomiting, unspecified: Secondary | ICD-10-CM | POA: Diagnosis not present

## 2017-08-01 DIAGNOSIS — M797 Fibromyalgia: Secondary | ICD-10-CM | POA: Diagnosis present

## 2017-08-01 DIAGNOSIS — R402441 Other coma, without documented Glasgow coma scale score, or with partial score reported, in the field [EMT or ambulance]: Secondary | ICD-10-CM | POA: Diagnosis not present

## 2017-08-01 DIAGNOSIS — F331 Major depressive disorder, recurrent, moderate: Secondary | ICD-10-CM | POA: Diagnosis not present

## 2017-08-01 DIAGNOSIS — I252 Old myocardial infarction: Secondary | ICD-10-CM | POA: Diagnosis not present

## 2017-08-01 DIAGNOSIS — Z9841 Cataract extraction status, right eye: Secondary | ICD-10-CM

## 2017-08-01 DIAGNOSIS — Z981 Arthrodesis status: Secondary | ICD-10-CM | POA: Diagnosis not present

## 2017-08-01 DIAGNOSIS — Z781 Physical restraint status: Secondary | ICD-10-CM | POA: Diagnosis not present

## 2017-08-01 DIAGNOSIS — F411 Generalized anxiety disorder: Secondary | ICD-10-CM | POA: Diagnosis not present

## 2017-08-01 DIAGNOSIS — Z8673 Personal history of transient ischemic attack (TIA), and cerebral infarction without residual deficits: Secondary | ICD-10-CM

## 2017-08-01 DIAGNOSIS — Z681 Body mass index (BMI) 19 or less, adult: Secondary | ICD-10-CM

## 2017-08-01 DIAGNOSIS — E1310 Other specified diabetes mellitus with ketoacidosis without coma: Secondary | ICD-10-CM

## 2017-08-01 DIAGNOSIS — K226 Gastro-esophageal laceration-hemorrhage syndrome: Secondary | ICD-10-CM | POA: Diagnosis present

## 2017-08-01 DIAGNOSIS — Z515 Encounter for palliative care: Secondary | ICD-10-CM | POA: Diagnosis not present

## 2017-08-01 DIAGNOSIS — E1051 Type 1 diabetes mellitus with diabetic peripheral angiopathy without gangrene: Secondary | ICD-10-CM | POA: Diagnosis present

## 2017-08-01 DIAGNOSIS — E039 Hypothyroidism, unspecified: Secondary | ICD-10-CM | POA: Diagnosis present

## 2017-08-01 DIAGNOSIS — E10319 Type 1 diabetes mellitus with unspecified diabetic retinopathy without macular edema: Secondary | ICD-10-CM | POA: Diagnosis present

## 2017-08-01 DIAGNOSIS — E1010 Type 1 diabetes mellitus with ketoacidosis without coma: Secondary | ICD-10-CM | POA: Diagnosis not present

## 2017-08-01 DIAGNOSIS — Z955 Presence of coronary angioplasty implant and graft: Secondary | ICD-10-CM | POA: Diagnosis not present

## 2017-08-01 DIAGNOSIS — F419 Anxiety disorder, unspecified: Secondary | ICD-10-CM | POA: Diagnosis present

## 2017-08-01 DIAGNOSIS — E1110 Type 2 diabetes mellitus with ketoacidosis without coma: Secondary | ICD-10-CM | POA: Diagnosis present

## 2017-08-01 DIAGNOSIS — R4182 Altered mental status, unspecified: Secondary | ICD-10-CM | POA: Diagnosis not present

## 2017-08-01 DIAGNOSIS — G4700 Insomnia, unspecified: Secondary | ICD-10-CM | POA: Diagnosis not present

## 2017-08-01 DIAGNOSIS — R000 Tachycardia, unspecified: Secondary | ICD-10-CM | POA: Diagnosis not present

## 2017-08-01 LAB — URINALYSIS, ROUTINE W REFLEX MICROSCOPIC
BILIRUBIN URINE: NEGATIVE
Glucose, UA: >=500 mg/dL — AB
Ketones, ur: 80 mg/dL — AB
Leukocytes, UA: NEGATIVE
NITRITE: NEGATIVE
PH: 5 (ref 5.0–8.0)
Protein, ur: NEGATIVE mg/dL
SPECIFIC GRAVITY, URINE: 1.018 (ref 1.005–1.030)
Squamous Epithelial / LPF: NONE SEEN

## 2017-08-01 LAB — BETA-HYDROXYBUTYRIC ACID: Beta-Hydroxybutyric Acid: >8 mmol/L — ABNORMAL HIGH (ref 0.05–0.27)

## 2017-08-01 LAB — BASIC METABOLIC PANEL
Anion gap: 15 (ref 5–15)
Anion gap: 13 (ref 5–15)
BUN: 15 mg/dL (ref 6–20)
BUN: 13 mg/dL (ref 6–20)
CALCIUM: 10.1 mg/dL (ref 8.9–10.3)
CALCIUM: 9.6 mg/dL (ref 8.9–10.3)
CHLORIDE: 114 mmol/L — AB (ref 101–111)
CHLORIDE: 113 mmol/L — AB (ref 101–111)
CO2: 20 mmol/L — ABNORMAL LOW (ref 22–32)
CO2: 21 mmol/L — ABNORMAL LOW (ref 22–32)
CREATININE: 0.72 mg/dL (ref 0.44–1.00)
CREATININE: 0.7 mg/dL (ref 0.44–1.00)
GFR calc non Af Amer: >60 mL/min (ref >60)
Glucose, Bld: 159 mg/dL — ABNORMAL HIGH (ref 65–99)
Glucose, Bld: 162 mg/dL — ABNORMAL HIGH (ref 65–99)
Potassium: 3.7 mmol/L (ref 3.5–5.1)
Potassium: 3.4 mmol/L — ABNORMAL LOW (ref 3.5–5.1)
SODIUM: 149 mmol/L — AB (ref 135–145)
SODIUM: 147 mmol/L — AB (ref 135–145)

## 2017-08-01 LAB — BLOOD GAS, VENOUS
ACID-BASE DEFICIT: 6 mmol/L — AB (ref 0.0–2.0)
BICARBONATE: 18.8 mmol/L — AB (ref 20.0–28.0)
O2 Saturation: 26.9 %
PCO2 VEN: 36.3 mmHg — AB (ref 44.0–60.0)
PH VEN: 7.334 (ref 7.250–7.430)
Patient temperature: 98.1

## 2017-08-01 LAB — COMPREHENSIVE METABOLIC PANEL
ALK PHOS: 90 U/L (ref 38–126)
ALT: 21 U/L (ref 14–54)
ANION GAP: 24 — AB (ref 5–15)
AST: 27 U/L (ref 15–41)
Albumin: 4.2 g/dL (ref 3.5–5.0)
BILIRUBIN TOTAL: 1.3 mg/dL — AB (ref 0.3–1.2)
BUN: 17 mg/dL (ref 6–20)
CALCIUM: 10.5 mg/dL — AB (ref 8.9–10.3)
CO2: 16 mmol/L — ABNORMAL LOW (ref 22–32)
Chloride: 107 mmol/L (ref 101–111)
Creatinine, Ser: 0.9 mg/dL (ref 0.44–1.00)
Glucose, Bld: 318 mg/dL — ABNORMAL HIGH (ref 65–99)
Potassium: 3.8 mmol/L (ref 3.5–5.1)
Sodium: 147 mmol/L — ABNORMAL HIGH (ref 135–145)
TOTAL PROTEIN: 7.1 g/dL (ref 6.5–8.1)

## 2017-08-01 LAB — ABO/RH: ABO/RH(D): AB POS

## 2017-08-01 LAB — CBC WITH DIFFERENTIAL/PLATELET
Basophils Absolute: 0 10*3/uL (ref 0.0–0.1)
Basophils Relative: 0 %
EOS ABS: 0 10*3/uL (ref 0.0–0.7)
Eosinophils Relative: 0 %
HEMATOCRIT: 32.5 % — AB (ref 36.0–46.0)
HEMOGLOBIN: 10.8 g/dL — AB (ref 12.0–15.0)
LYMPHS ABS: 0.9 10*3/uL (ref 0.7–4.0)
Lymphocytes Relative: 7 %
MCH: 28.3 pg (ref 26.0–34.0)
MCHC: 33.2 g/dL (ref 30.0–36.0)
MCV: 85.1 fL (ref 78.0–100.0)
MONOS PCT: 5 %
Monocytes Absolute: 0.7 10*3/uL (ref 0.1–1.0)
NEUTROS ABS: 11.5 10*3/uL — AB (ref 1.7–7.7)
NEUTROS PCT: 88 %
Platelets: 279 10*3/uL (ref 150–400)
RBC: 3.82 MIL/uL — ABNORMAL LOW (ref 3.87–5.11)
RDW: 15.9 % — ABNORMAL HIGH (ref 11.5–15.5)
WBC: 13 10*3/uL — ABNORMAL HIGH (ref 4.0–10.5)

## 2017-08-01 LAB — GLUCOSE, CAPILLARY
GLUCOSE-CAPILLARY: 148 mg/dL — AB (ref 65–99)
GLUCOSE-CAPILLARY: 126 mg/dL — AB (ref 65–99)
GLUCOSE-CAPILLARY: 58 mg/dL — AB (ref 65–99)
Glucose-Capillary: 168 mg/dL — ABNORMAL HIGH (ref 65–99)
Glucose-Capillary: 139 mg/dL — ABNORMAL HIGH (ref 65–99)
Glucose-Capillary: 145 mg/dL — ABNORMAL HIGH (ref 65–99)
Glucose-Capillary: 137 mg/dL — ABNORMAL HIGH (ref 65–99)
Glucose-Capillary: 140 mg/dL — ABNORMAL HIGH (ref 65–99)
Glucose-Capillary: 135 mg/dL — ABNORMAL HIGH (ref 65–99)
Glucose-Capillary: 47 mg/dL — ABNORMAL LOW (ref 65–99)
Glucose-Capillary: 148 mg/dL — ABNORMAL HIGH (ref 65–99)
Glucose-Capillary: 75 mg/dL (ref 65–99)

## 2017-08-01 LAB — PROTIME-INR
INR: 1.06
Prothrombin Time: 13.7 seconds (ref 11.4–15.2)

## 2017-08-01 LAB — TYPE AND SCREEN
ABO/RH(D): AB POS
ANTIBODY SCREEN: NEGATIVE

## 2017-08-01 LAB — I-STAT CG4 LACTIC ACID, ED: LACTIC ACID, VENOUS: 1.87 mmol/L (ref 0.5–1.9)

## 2017-08-01 LAB — LIPASE, BLOOD: Lipase: 21 U/L (ref 11–51)

## 2017-08-01 LAB — POC OCCULT BLOOD, ED: FECAL OCCULT BLD: POSITIVE — AB

## 2017-08-01 LAB — CBG MONITORING, ED: Glucose-Capillary: 241 mg/dL — ABNORMAL HIGH (ref 65–99)

## 2017-08-01 LAB — MRSA PCR SCREENING: MRSA BY PCR: NEGATIVE

## 2017-08-01 MED ORDER — ADULT MULTIVITAMIN W/MINERALS CH
1.0000 | ORAL_TABLET | Freq: Every day | ORAL | Status: DC
  Administered 2017-08-01 – 2017-08-02 (×2): 1 via ORAL
  Filled 2017-08-01 (×3): qty 1

## 2017-08-01 MED ORDER — LORAZEPAM 0.5 MG PO TABS
0.5000 mg | ORAL_TABLET | Freq: Every day | ORAL | Status: DC
  Administered 2017-08-01 – 2017-08-02 (×2): 0.5 mg via ORAL
  Filled 2017-08-01 (×2): qty 1

## 2017-08-01 MED ORDER — SODIUM CHLORIDE 0.9 % IV SOLN: INTRAVENOUS | Status: DC

## 2017-08-01 MED ORDER — ARIPIPRAZOLE 5 MG PO TABS
5.0000 mg | ORAL_TABLET | Freq: Every day | ORAL | Status: DC
  Administered 2017-08-01 – 2017-08-02 (×2): 5 mg via ORAL
  Filled 2017-08-01 (×3): qty 1

## 2017-08-01 MED ORDER — ONDANSETRON HCL 4 MG PO TABS: 4.0000 mg | ORAL_TABLET | Freq: Four times a day (QID) | ORAL | Status: DC | PRN

## 2017-08-01 MED ORDER — METOCLOPRAMIDE HCL 10 MG PO TABS
5.0000 mg | ORAL_TABLET | Freq: Three times a day (TID) | ORAL | Status: DC
  Administered 2017-08-01 – 2017-08-03 (×6): 5 mg via ORAL
  Filled 2017-08-01 (×6): qty 1

## 2017-08-01 MED ORDER — HYDRALAZINE HCL 25 MG PO TABS
25.0000 mg | ORAL_TABLET | Freq: Four times a day (QID) | ORAL | Status: DC | PRN
  Administered 2017-08-01: 25 mg via ORAL
  Filled 2017-08-01 (×2): qty 1

## 2017-08-01 MED ORDER — DEXTROSE-NACL 5-0.45 % IV SOLN
INTRAVENOUS | Status: DC
  Administered 2017-08-01: 06:00:00 via INTRAVENOUS

## 2017-08-01 MED ORDER — POTASSIUM CHLORIDE 10 MEQ/100ML IV SOLN
10.0000 meq | INTRAVENOUS | Status: AC
  Administered 2017-08-01: 10 meq via INTRAVENOUS
  Filled 2017-08-01 (×2): qty 100

## 2017-08-01 MED ORDER — SODIUM CHLORIDE 0.9 % IV SOLN
INTRAVENOUS | Status: AC
  Administered 2017-08-01: 05:00:00 via INTRAVENOUS

## 2017-08-01 MED ORDER — AMLODIPINE BESYLATE 10 MG PO TABS
10.0000 mg | ORAL_TABLET | Freq: Every day | ORAL | Status: DC
  Administered 2017-08-01: 10 mg via ORAL
  Filled 2017-08-01 (×3): qty 1

## 2017-08-01 MED ORDER — LAMOTRIGINE 100 MG PO TABS
100.0000 mg | ORAL_TABLET | Freq: Every day | ORAL | Status: DC
  Administered 2017-08-01 – 2017-08-02 (×2): 100 mg via ORAL
  Filled 2017-08-01 (×2): qty 1

## 2017-08-01 MED ORDER — GLUCOSE 40 % PO GEL
ORAL | Status: AC
  Administered 2017-08-01: 37.5 g
  Filled 2017-08-01: qty 1

## 2017-08-01 MED ORDER — SERTRALINE HCL 50 MG PO TABS
50.0000 mg | ORAL_TABLET | Freq: Two times a day (BID) | ORAL | Status: DC
  Administered 2017-08-01 – 2017-08-02 (×4): 50 mg via ORAL
  Filled 2017-08-01 (×5): qty 1

## 2017-08-01 MED ORDER — INSULIN ASPART 100 UNIT/ML ~~LOC~~ SOLN
0.0000 [IU] | Freq: Three times a day (TID) | SUBCUTANEOUS | Status: DC
  Administered 2017-08-01: 1 [IU] via SUBCUTANEOUS
  Administered 2017-08-02: 7 [IU] via SUBCUTANEOUS
  Administered 2017-08-02: 3 [IU] via SUBCUTANEOUS
  Administered 2017-08-02: 2 [IU] via SUBCUTANEOUS
  Administered 2017-08-03: 3 [IU] via SUBCUTANEOUS

## 2017-08-01 MED ORDER — METOPROLOL TARTRATE 25 MG PO TABS
25.0000 mg | ORAL_TABLET | Freq: Two times a day (BID) | ORAL | Status: DC
  Administered 2017-08-01 (×2): 25 mg via ORAL
  Filled 2017-08-01 (×2): qty 1

## 2017-08-01 MED ORDER — INSULIN GLARGINE 100 UNIT/ML ~~LOC~~ SOLN
10.0000 [IU] | Freq: Every day | SUBCUTANEOUS | Status: DC
  Administered 2017-08-01 – 2017-08-02 (×2): 10 [IU] via SUBCUTANEOUS
  Filled 2017-08-01 (×3): qty 0.1

## 2017-08-01 MED ORDER — SODIUM CHLORIDE 0.9 % IV SOLN
INTRAVENOUS | Status: DC
  Administered 2017-08-01: 17:00:00 via INTRAVENOUS

## 2017-08-01 MED ORDER — LEVOTHYROXINE SODIUM 50 MCG PO TABS
50.0000 ug | ORAL_TABLET | Freq: Every day | ORAL | Status: DC
  Administered 2017-08-01 – 2017-08-03 (×3): 50 ug via ORAL
  Filled 2017-08-01: qty 1
  Filled 2017-08-01: qty 2
  Filled 2017-08-01: qty 1

## 2017-08-01 MED ORDER — DEXTROSE 50 % IV SOLN
INTRAVENOUS | Status: AC
  Administered 2017-08-01: 25 mL
  Filled 2017-08-01: qty 50

## 2017-08-01 MED ORDER — SODIUM CHLORIDE 0.9 % IV SOLN
INTRAVENOUS | Status: DC
  Administered 2017-08-01: 1.8 [IU]/h via INTRAVENOUS
  Filled 2017-08-01: qty 1

## 2017-08-01 NOTE — Progress Notes (Signed)
Hypoglycemic Event  CBG: 47  Time: 1700H  Treatment: Dextrose 40% oral gel  Symptoms: None  Follow-up CBG: Time: 1730H  CBG Result: 58  Treatment: 50% Dextrose solution  Follow-up CBG: Time: 1806H  CBG Result: 148  Possible Reasons for Event: Transition off DKA protocol

## 2017-08-01 NOTE — ED Notes (Signed)
Bed: [id number] Expected date:  Expected time:  Means of arrival:  Comments: [id number] n/v/d

## 2017-08-01 NOTE — ED Triage Notes (Signed)
[facility] with c/o persistent emesis.  Pt has been throwing up "coffee-ground" gastric contents since yesterday morning.  Pt is non-verbal with severe dementia---- on palliative care.  Pt was given Phenergan by facility staff as needed but without improvement.

## 2017-08-01 NOTE — Progress Notes (Signed)
There was a delay updating the patient's PTA medications. We received records from [facility] this morning. The following changes were made:  - Removed Humalog meal coverage - Changed Novolog SSI parameters - Changed Levemir dose - Added Protonix - Added Phenergan  Charolotte Ekeom Madex Seals, PharmD. Mobile: [phone]. 08/01/2017,11:58 AM.

## 2017-08-01 NOTE — Progress Notes (Addendum)
Inpatient Diabetes Program Recommendations  AACE/ADA: New Consensus Statement on Inpatient Glycemic Control (2015)  Target Ranges:  Prepandial:   less than 140 mg/dL      Peak postprandial:   less than 180 mg/dL (1-2 hours)      Critically ill patients:  140 - 180 mg/dL   Results for Theron AristaGANT, Kaysia C (MRN [id number]) as of 08/01/2017 09:53  Ref. Range 08/01/2017 01:29  Sodium Latest Ref Range: 135 - 145 mmol/L 147 (H)  Potassium Latest Ref Range: 3.5 - 5.1 mmol/L 3.8  Chloride Latest Ref Range: 101 - 111 mmol/L 107  CO2 Latest Ref Range: 22 - 32 mmol/L 16 (L)  Glucose Latest Ref Range: 65 - 99 mg/dL 409318 (H)  BUN Latest Ref Range: 6 - 20 mg/dL 17  Creatinine Latest Ref Range: 0.44 - 1.00 mg/dL 8.110.90  Calcium Latest Ref Range: 8.9 - 10.3 mg/dL 91.410.5 (H)  Anion gap Latest Ref Range: 5 - 15  24 (H)    Admit: DKA  History: Type 1 DM  SNF Meds: Levemir 8 units BID          Novolog 0-9 units TID per SSI  Current Orders: IV Insulin drip      Note CO2 improved to 20 this AM but Anion Gap still elevated to 15 on 6:37am BMET today.  Recommend leaving pt on IV Insulin drip until Anion Gap measures 12 or less.  Once pt ready to transition to SQ insulin recommend the following: Levemir 10 units BID (start one hour before IV Insulin drip stopped) Novolog Sensitive Correction Scale/ SSI (0-9 units) Q4 hours      --Will follow patient during hospitalization--  Ambrose FinlandJeannine Johnston Aster Screws RN, MSN, CDE Diabetes Coordinator Inpatient Glycemic Control Team Team Pager: [phone] (8a-5p)

## 2017-08-01 NOTE — H&P (Signed)
History and Physical    Beth Payne RUE:[id number] DOB: 07/30/1940 DOA: 08/01/2017  PCP: Renford DillsPolite, Ronald, MD  Patient coming from: [facility]  I have personally briefly [facility]  Chief Complaint: Intractable nausea and vomiting  HPI: Beth Payne is a 77 y.o. female with medical history significant of DM1, recurrent admits for DKA suspected to be due to poor PO intake associated with end stage dementia.  Patient is DNR/DNI and was hospice care when discharged last admit.  At the SNF today patient developed intractable nausea and vomiting.  Multiple rounds of phenergan provided no relief.  Vomiting ultimately turned bloody with small amounts of bloody emesis.  EMS called.  EMS brought patient to ED.   ED Course: Found to be in DKA with AG 24.   Review of Systems: Unable to perform due to AMS.  Past Medical History:  Diagnosis Date  . Anxiety   . Arthritis    "fingers" (12/23/2014)  . Chronic lower back pain   . Coronary artery disease   . Dementia   . Depression   . Fibromyalgia   . Fracture, ankle 12/23/2014  . GERD (gastroesophageal reflux disease) dx'd 1995  . Heart murmur   . NSTEMI (non-ST elevated myocardial infarction) (HCC) 12/13/2014  . Osteopenia dx'd 2007  . Retinopathy   . TIA (transient ischemic attack)   . Type I diabetes mellitus (HCC) dx'd 1953   "I'm on a pup" (12/23/2014)    Past Surgical History:  Procedure Laterality Date  . ABDOMINAL HYSTERECTOMY  1972  . ANTERIOR CERVICAL DECOMP/DISCECTOMY FUSION  05/2003   "C5-6"  . APPENDECTOMY    . BACK SURGERY    . BILATERAL CARPAL TUNNEL RELEASE Bilateral 2003-2004  . BUNIONECTOMY WITH HAMMERTOE RECONSTRUCTION Right 02/1999  . CATARACT EXTRACTION W/ INTRAOCULAR LENS  IMPLANT, BILATERAL Bilateral 1998  . CERVICAL DISC SURGERY  2004   "collapsed C-5"  . CESAREAN SECTION    . CORONARY ANGIOPLASTY WITH STENT PLACEMENT  07/1999   "?1"  . DILATION AND  CURETTAGE OF UTERUS    . EXPLORATORY LAPAROTOMY  1975-1987 X 1   "adhesions"  . EYE SURGERY Bilateral since 1977   "numerous slaser surgeries for retinopathy"  . FRACTURE SURGERY    . LAPAROSCOPIC LYSIS OF ADHESIONS  [phone] [id number] X [phone]  . LUMBAR MICRODISCECTOMY  03/2005  . OOPHORECTOMY Bilateral 1980's  . ORIF ANKLE FRACTURE Left 12/25/2014   Procedure: OPEN REDUCTION INTERNAL FIXATION (ORIF) LEFT TRIMALLEOLAR ANKLE FRACTURE;  Surgeon: Tarry KosNaiping M Xu, MD;  Location: [facility] OR;  Service: Orthopedics;  Laterality: Left;  . PATELLA FRACTURE SURGERY Left 04/2007  . TONSILLECTOMY    . TRIGGER FINGER RELEASE Bilateral 2003  . VERTEBROPLASTY  01/2008   "compressed lumbar fracture"  . VITRECTOMY Right 08/1997     reports that she has quit smoking. Her smoking use included cigarettes. She has a 27.00 pack-year smoking history. she has never used smokeless tobacco. She reports that she does not drink alcohol or use drugs.  Allergies  Allergen Reactions  . Diclofenac Sodium Swelling and Other (See Comments)    Blurred vision  . Doxycycline Calcium Swelling and Other (See Comments)    Blurred vision  . Calcium-Containing Compounds Other (See Comments)    [facility]  . Codeine Nausea And Vomiting and Other (See Comments)    Dizziness   . Zocor [Simvastatin] Other (See Comments)    Dizziness, weakness  Family History  Problem Relation Age of Onset  . Liver disease Sister   . Colon cancer Brother      Prior to Admission medications   Medication Sig Start Date End Date Taking? Authorizing Provider  amLODipine (NORVASC) 10 MG tablet Take 1 tablet (10 mg total) by mouth daily. 05/22/15   Richarda Overlie, MD  ARIPiprazole (ABILIFY) 5 MG tablet Take 1 tablet (5 mg total) by mouth every morning. Patient taking differently: Take 5 mg by mouth daily.  04/28/15   Ghimire, Werner Lean, MD  feeding supplement, ENSURE ENLIVE, (ENSURE ENLIVE) LIQD Take 237 mLs by mouth 2 (two)  times daily between meals. 06/23/17   Alwyn Ren, MD  glucagon (GLUCAGON EMERGENCY) 1 MG injection Inject 1 mg into the vein once as needed (low blood sugar).    [provider]  insulin aspart (NOVOLOG) 100 UNIT/ML injection Inject at bedtime based upon sugars.  Sugar 0-120--no units; 121-150--1 unit;  151-200--2 units;  201-250--3 units; 251-300--5 units;  301-350--7 units;  351-400--9 units Patient not taking: Reported on 06/21/2017 12/15/15   Tat, Onalee Hua, MD  insulin detemir (LEVEMIR) 100 UNIT/ML injection Inject 0.1 mLs (10 Units total) into the skin 2 (two) times daily. 05/18/17   Dorothea Ogle, MD  insulin lispro (HUMALOG) 100 UNIT/ML injection Inject 2 Units into the skin 3 (three) times daily.     [provider]  lamoTRIgine (LAMICTAL) 25 MG tablet Take 100 mg by mouth at bedtime.     [provider]  levothyroxine (SYNTHROID, LEVOTHROID) 50 MCG tablet Take 50 mcg by mouth daily before breakfast.    [provider]  LORazepam (ATIVAN) 0.5 MG tablet Take 1 tablet (0.5 mg total) by mouth at bedtime. Anxiety 05/17/17   Dorothea Ogle, MD  magnesium hydroxide (MILK OF MAGNESIA) 400 MG/5ML suspension Take 30 mLs by mouth daily as needed for mild constipation.    [provider]  Melatonin 3 MG TABS Take 3 mg by mouth at bedtime.    [provider]  metoCLOPramide (REGLAN) 5 MG tablet Take 1 tablet (5 mg total) by mouth 3 (three) times daily before meals. Patient taking differently: Take 5 mg by mouth 3 (three) times daily before meals. For Gastroparesis 11/24/15   Rama, Maryruth Bun, MD  ondansetron (ZOFRAN) 4 MG tablet Take 4 mg by mouth every 6 (six) hours as needed for nausea or vomiting.    [provider]  polyethylene glycol (MIRALAX / GLYCOLAX) packet Take 17 g by mouth daily. Patient taking differently: Take 17 g by mouth daily. Mix in 8 oz water and drink 11/24/15   Rama, Maryruth Bun, MD  prazosin (MINIPRESS) 1 MG  capsule Take 1 mg by mouth at bedtime.     [provider]  sertraline (ZOLOFT) 50 MG tablet Take 50 mg by mouth 2 (two) times daily.     [provider]    Physical Exam: Vitals:   08/01/17 0230 08/01/17 0300 08/01/17 0330 08/01/17 0426  BP: 140/66 (!) 149/59 (!) 146/71   Pulse: (!) 106 (!) 102 (!) 103   Resp:  15    Temp:      TempSrc:      SpO2: 100% 99% 100%   Weight:    43.1 kg (95 lb)  Height:    5\' 2"  (1.575 m)    Constitutional: Non-verbal Eyes: PERRL, lids and conjunctivae normal ENMT: Mucous membranes are moist. Posterior pharynx clear of any exudate or lesions.Normal dentition.  Neck: normal, supple, no masses, no thyromegaly Respiratory: clear to auscultation bilaterally, no wheezing, no crackles. Normal respiratory effort. No accessory muscle use.  Cardiovascular: Regular rate and rhythm, no murmurs / rubs / gallops. No extremity edema. 2+ pedal pulses. No carotid bruits.  Abdomen: no tenderness, no masses palpated. No hepatosplenomegaly. Bowel sounds positive.  Musculoskeletal: no clubbing / cyanosis. No joint deformity upper and lower extremities. Good ROM, no contractures. Muscle wasting present. Skin: no rashes, lesions, ulcers. No induration Neurologic: Non-verbal, MAE Psychiatric: Non-verbal   Labs on Admission: I have personally reviewed following labs and imaging studies  CBC: Recent Labs  Lab 08/01/17 0129  WBC 13.0*  NEUTROABS 11.5*  HGB 10.8*  HCT 32.5*  MCV 85.1  PLT 279   Basic Metabolic Panel: Recent Labs  Lab 08/01/17 0129  NA 147*  K 3.8  CL 107  CO2 16*  GLUCOSE 318*  BUN 17  CREATININE 0.90  CALCIUM 10.5*   GFR: Estimated Creatinine Clearance: 36.2 mL/min (by C-G formula based on SCr of 0.9 mg/dL). Liver Function Tests: Recent Labs  Lab 08/01/17 0129  AST 27  ALT 21  ALKPHOS 90  BILITOT 1.3*  PROT 7.1  ALBUMIN 4.2   Recent Labs  Lab 08/01/17 0129  LIPASE 21   No results for input(s): AMMONIA in  the last 168 hours. Coagulation Profile: Recent Labs  Lab 08/01/17 0129  INR 1.06   Cardiac Enzymes: No results for input(s): CKTOTAL, CKMB, CKMBINDEX, TROPONINI in the last 168 hours. BNP (last 3 results) No results for input(s): PROBNP in the last 8760 hours. HbA1C: No results for input(s): HGBA1C in the last 72 hours. CBG: Recent Labs  Lab 08/01/17 0429  GLUCAP 241*   Lipid Profile: No results for input(s): CHOL, HDL, LDLCALC, TRIG, CHOLHDL, LDLDIRECT in the last 72 hours. Thyroid Function Tests: No results for input(s): TSH, T4TOTAL, FREET4, T3FREE, THYROIDAB in the last 72 hours. Anemia Panel: No results for input(s): VITAMINB12, FOLATE, FERRITIN, TIBC, IRON, RETICCTPCT in the last 72 hours. Urine analysis:    Component Value Date/Time   COLORURINE YELLOW 08/01/2017 0149   APPEARANCEUR CLEAR 08/01/2017 0149   LABSPEC 1.018 08/01/2017 0149   PHURINE 5.0 08/01/2017 0149   GLUCOSEU >=500 (A) 08/01/2017 0149   HGBUR SMALL (A) 08/01/2017 0149   BILIRUBINUR NEGATIVE 08/01/2017 0149   KETONESUR 80 (A) 08/01/2017 0149   PROTEINUR NEGATIVE 08/01/2017 0149   UROBILINOGEN 0.2 02/23/2015 1433   NITRITE NEGATIVE 08/01/2017 0149   LEUKOCYTESUR NEGATIVE 08/01/2017 0149    Radiological Exams on Admission: Dg Abd Acute W/chest  Result Date: 08/01/2017 CLINICAL DATA:  Coffee ground emesis since yesterday. EXAM: DG ABDOMEN ACUTE W/ 1V CHEST COMPARISON:  Chest radiograph June 21, 2017 FINDINGS: Cardiac silhouette is mildly enlarged. Mediastinal silhouette is non suspicious. Lungs are clear, no pleural effusions. No pneumothorax. Soft tissue planes and included osseous structures are nonacute. Osteopenia. Degenerative change of the shoulders. Bowel gas pattern is nondilated and nonobstructive. No intra-abdominal mass effect, pathologic calcifications or free air. Soft tissue planes and included osseous structures are non-suspicious. Moderate vascular calcifications. L1 vertebral body  cement augmentation. IMPRESSION: Mild cardiomegaly.  No acute pulmonary process. Mild amount of retained large bowel stool. Normal bowel gas pattern. Aortic Atherosclerosis (ICD10-I70.0). Electronically Signed   By: Awilda Metro M.D.   On: 08/01/2017 02:40    EKG: Independently reviewed.  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 Intractable nausea and vomiting due to DKA - 1. The only way I know of to relieve the patients N/V at this point after failing N/V meds, is to actually treat the DKA. 2. At this point, my options are either fix patients DKA, or allow patient to vomit her self to death, which seems to be a terrible way pass on. 3. Will admit, and treat DKA for patient comfort and to relieve her suffering. 4. DKA pathway 5. Labs per pathway 6. IVF 7. Insulin gtt 8. Pal care consult in AM 2. Dementia - 1. Advanced dementia, patient non-verbal at baseline 3. Hematemesis - suspect mallory weis tear 1. Small amount of hematemesis frequently in ED 2. I certainly dont plan on repeating H/H nor getting GI involved on this hospice patient 3. EDP did give protonix. 4. Will treat the underlying DKA causing intractable nausea and vomiting as above 5. Pal care consult.  DVT prophylaxis: SCDs Code Status: DNR/DNI - MOST form present, admitting for comfort treatment of DKA which is causing intractable nausea and vomiting Family Communication: No family in room Disposition Plan: SNF Consults called: Pal care in AM Admission status: Admit to inpatient   Hillary Bow DO [facility] Pager [phone]  If 7AM-7PM, please contact day team taking care of patient [email or web address] Password [facility]  08/01/2017, 4:52 AM

## 2017-08-01 NOTE — ED Provider Notes (Signed)
[facility]-EMERGENCY DEPT Provider Note   CSN: [id number] Arrival date & time: 08/01/17  0046     History   Chief Complaint Chief Complaint  Patient presents with  . Emesis    HPI Beth Payne is a 77 y.o. female.  Patient sent to the emergency department for evaluation of vomiting.  Symptoms reportedly began earlier today, have been persistent through the day despite administration of Phenergan.  Patient has a history of severe dementia, cannot provide any further information.  Information provided by EMS via nursing home staff. Level V Caveat due to dementia.      Past Medical History:  Diagnosis Date  . Anxiety   . Arthritis    "fingers" (12/23/2014)  . Chronic lower back pain   . Coronary artery disease   . Dementia   . Depression   . Fibromyalgia   . Fracture, ankle 12/23/2014  . GERD (gastroesophageal reflux disease) dx'd 1995  . Heart murmur   . NSTEMI (non-ST elevated myocardial infarction) (HCC) 12/13/2014  . Osteopenia dx'd 2007  . Retinopathy   . TIA (transient ischemic attack)   . Type I diabetes mellitus (HCC) dx'd 1953   "I'm on a pup" (12/23/2014)    Patient Active Problem List   Diagnosis Date Noted  . Hospice care patient 08/01/2017  . Anemia of chronic disease 06/22/2017  . Prolonged Q-T interval on ECG 05/14/2017  . Nausea and vomiting 05/14/2017  . Leukocytosis 05/14/2017  . DKA (diabetic ketoacidoses) (HCC) 02/02/2016  . DKA, type 1 (HCC) 02/02/2016  . Chronic pain syndrome   . Depression   . Anxiety state   . Hypothyroidism   . Diabetic gastroparesis (HCC) 01/19/2016  . Transaminasemia   . Transaminitis 12/13/2015  . Hypomagnesemia 11/24/2015  . Protein-calorie malnutrition, severe 11/22/2015  . Pressure ulcer, stage I, non-blanching erythema 11/22/2015  . 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 Dementia 11/20/2015  . Abdominal pain 11/20/2015  . Weakness generalized 05/20/2015  . Lung mass 05/20/2015  . Hypokalemia  04/24/2015  . Acute encephalopathy 01/01/2015  . Type 1 diabetes mellitus with peripheral circulatory complications (HCC) 12/17/2014  . CAD in native artery   . Pulmonary hypertension (HCC)   . GERD (gastroesophageal reflux disease) 12/12/2014  . CAD s/p stent to mid-RCA in 2001. 12/12/2014  . Osteoarthritis 12/12/2014  . Systolic murmur 12/12/2014  . Normocytic anemia 12/12/2014  . Dyslipidemia 12/12/2014    Past Surgical History:  Procedure Laterality Date  . ABDOMINAL HYSTERECTOMY  1972  . ANTERIOR CERVICAL DECOMP/DISCECTOMY FUSION  05/2003   "C5-6"  . APPENDECTOMY    . BACK SURGERY    . BILATERAL CARPAL TUNNEL RELEASE Bilateral 2003-2004  . BUNIONECTOMY WITH HAMMERTOE RECONSTRUCTION Right 02/1999  . CATARACT EXTRACTION W/ INTRAOCULAR LENS  IMPLANT, BILATERAL Bilateral 1998  . CERVICAL DISC SURGERY  2004   "collapsed C-5"  . CESAREAN SECTION    . CORONARY ANGIOPLASTY WITH STENT PLACEMENT  07/1999   "?1"  . DILATION AND CURETTAGE OF UTERUS    . EXPLORATORY LAPAROTOMY  1975-1987 X 1   "adhesions"  . EYE SURGERY Bilateral since 1977   "numerous slaser surgeries for retinopathy"  . FRACTURE SURGERY    . LAPAROSCOPIC LYSIS OF ADHESIONS  [phone] X 5  . LUMBAR MICRODISCECTOMY  03/2005  . OOPHORECTOMY Bilateral 1980's  . ORIF ANKLE FRACTURE Left 12/25/2014   Procedure: OPEN REDUCTION INTERNAL FIXATION (ORIF) LEFT TRIMALLEOLAR ANKLE FRACTURE;  Surgeon: Tarry KosNaiping M Xu, MD;  Location: [facility] OR;  Service:  Orthopedics;  Laterality: Left;  . PATELLA FRACTURE SURGERY Left 04/2007  . TONSILLECTOMY    . TRIGGER FINGER RELEASE Bilateral 2003  . VERTEBROPLASTY  01/2008   "compressed lumbar fracture"  . VITRECTOMY Right 08/1997    OB History    No data available       Home Medications    Prior to Admission medications   Medication Sig Start Date End Date Taking? Authorizing Provider  amLODipine (NORVASC) 10 MG tablet Take 1 tablet (10 mg total) by mouth daily. 05/22/15   Richarda Overlie, MD  ARIPiprazole (ABILIFY) 5 MG tablet Take 1 tablet (5 mg total) by mouth every morning. Patient taking differently: Take 5 mg by mouth daily.  04/28/15   Ghimire, Werner Lean, MD  feeding supplement, ENSURE ENLIVE, (ENSURE ENLIVE) LIQD Take 237 mLs by mouth 2 (two) times daily between meals. 06/23/17   Alwyn Ren, MD  glucagon (GLUCAGON EMERGENCY) 1 MG injection Inject 1 mg into the vein once as needed (low blood sugar).    [provider]  insulin aspart (NOVOLOG) 100 UNIT/ML injection Inject at bedtime based upon sugars.  Sugar 0-120--no units; 121-150--1 unit;  151-200--2 units;  201-250--3 units; 251-300--5 units;  301-350--7 units;  351-400--9 units Patient not taking: Reported on 06/21/2017 12/15/15   Tat, Onalee Hua, MD  insulin detemir (LEVEMIR) 100 UNIT/ML injection Inject 0.1 mLs (10 Units total) into the skin 2 (two) times daily. 05/18/17   Dorothea Ogle, MD  insulin lispro (HUMALOG) 100 UNIT/ML injection Inject 2 Units into the skin 3 (three) times daily.     [provider]  lamoTRIgine (LAMICTAL) 25 MG tablet Take 100 mg by mouth at bedtime.     [provider]  levothyroxine (SYNTHROID, LEVOTHROID) 50 MCG tablet Take 50 mcg by mouth daily before breakfast.    [provider]  LORazepam (ATIVAN) 0.5 MG tablet Take 1 tablet (0.5 mg total) by mouth at bedtime. Anxiety 05/17/17   Dorothea Ogle, MD  magnesium hydroxide (MILK OF MAGNESIA) 400 MG/5ML suspension Take 30 mLs by mouth daily as needed for mild constipation.    [provider]  Melatonin 3 MG TABS Take 3 mg by mouth at bedtime.    [provider]  metoCLOPramide (REGLAN) 5 MG tablet Take 1 tablet (5 mg total) by mouth 3 (three) times daily before meals. Patient taking differently: Take 5 mg by mouth 3 (three) times daily before meals. For Gastroparesis 11/24/15   Rama, Maryruth Bun, MD  ondansetron (ZOFRAN) 4 MG tablet Take 4 mg by mouth every 6 (six) hours as needed  for nausea or vomiting.    [provider]  polyethylene glycol (MIRALAX / GLYCOLAX) packet Take 17 g by mouth daily. Patient taking differently: Take 17 g by mouth daily. Mix in 8 oz water and drink 11/24/15   Rama, Maryruth Bun, MD  prazosin (MINIPRESS) 1 MG capsule Take 1 mg by mouth at bedtime.     [provider]  sertraline (ZOLOFT) 50 MG tablet Take 50 mg by mouth 2 (two) times daily.     [provider]    Family History Family History  Problem Relation Age of Onset  . Liver disease Sister   . Colon cancer Brother     Social History Social History   Tobacco Use  . Smoking status: Former Smoker    Packs/day: 1.00    Years: 27.00    Pack years: 27.00    Types: Cigarettes  .  Smokeless tobacco: Never Used  . Tobacco comment: "quit smoking cigarettes in 1985  Substance Use Topics  . Alcohol use: No  . Drug use: No     Allergies   Diclofenac sodium; Doxycycline calcium; Calcium-containing compounds; Codeine; and Zocor [simvastatin]   Review of Systems Review of Systems  Unable to perform ROS: Dementia     Physical Exam Updated Vital Signs BP (!) 146/71   Pulse (!) 103   Temp (S) 98.7 F (37.1 C) (Rectal)   Resp 15   Ht 5\' 2"  (1.575 m)   Wt 43.1 kg (95 lb)   SpO2 100%   BMI 17.38 kg/m   Physical Exam  Constitutional: She appears listless. No distress.  HENT:  Head: Normocephalic and atraumatic.  Right Ear: Hearing normal.  Left Ear: Hearing normal.  Nose: Nose normal.  Mouth/Throat: Oropharynx is clear and moist and mucous membranes are normal.  Eyes: Conjunctivae and EOM are normal. Pupils are equal, round, and reactive to light.  Neck: Normal range of motion. Neck supple.  Cardiovascular: Regular rhythm, S1 normal and S2 normal. Exam reveals no gallop and no friction rub.  No murmur heard. Pulmonary/Chest: Effort normal and breath sounds normal. No respiratory distress. She exhibits no tenderness.  Abdominal: Soft. Normal  appearance and bowel sounds are normal. There is no hepatosplenomegaly. There is no tenderness. There is no rebound, no guarding, no tenderness at McBurney's point and negative Murphy's sign. No hernia.  Musculoskeletal: Normal range of motion.  Neurological: She has normal strength. She appears listless. She is disoriented (moans, no intelligible response). No cranial nerve deficit or sensory deficit. Coordination normal.  Skin: Skin is warm, dry and intact. No rash noted. No cyanosis.  Psychiatric: She has a normal mood and affect. Her speech is normal and behavior is normal. Thought content normal.  Nursing note and vitals reviewed.    ED Treatments / Results  Labs (all labs ordered are listed, but only abnormal results are displayed) Labs Reviewed  CBC WITH DIFFERENTIAL/PLATELET - Abnormal; Notable for the following components:      Result Value   WBC 13.0 (*)    RBC 3.82 (*)    Hemoglobin 10.8 (*)    HCT 32.5 (*)    RDW 15.9 (*)    Neutro Abs 11.5 (*)    All other components within normal limits  COMPREHENSIVE METABOLIC PANEL - Abnormal; Notable for the following components:   Sodium 147 (*)    CO2 16 (*)    Glucose, Bld 318 (*)    Calcium 10.5 (*)    Total Bilirubin 1.3 (*)    Anion gap 24 (*)    All other components within normal limits  URINALYSIS, ROUTINE W REFLEX MICROSCOPIC - Abnormal; Notable for the following components:   Glucose, UA >=500 (*)    Hgb urine dipstick SMALL (*)    Ketones, ur 80 (*)    Bacteria, UA RARE (*)    All other components within normal limits  BETA-HYDROXYBUTYRIC ACID - Abnormal; Notable for the following components:   Beta-Hydroxybutyric Acid >8.00 (*)    All other components within normal limits  BLOOD GAS, VENOUS - Abnormal; Notable for the following components:   pCO2, Ven 36.3 (*)    Bicarbonate 18.8 (*)    Acid-base deficit 6.0 (*)    All other components within normal limits  POC OCCULT BLOOD, ED - Abnormal; Notable for the  following components:   Fecal Occult Bld POSITIVE (*)    All  other components within normal limits  CBG MONITORING, ED - Abnormal; Notable for the following components:   Glucose-Capillary 241 (*)    All other components within normal limits  URINE CULTURE  LIPASE, BLOOD  PROTIME-INR  BASIC METABOLIC PANEL  BASIC METABOLIC PANEL  BASIC METABOLIC PANEL  BASIC METABOLIC PANEL  BASIC METABOLIC PANEL  I-STAT CG4 LACTIC ACID, ED  TYPE AND SCREEN  ABO/RH    EKG  EKG Interpretation None       Radiology Dg Abd Acute W/chest  Result Date: 08/01/2017 CLINICAL DATA:  Coffee ground emesis since yesterday. EXAM: DG ABDOMEN ACUTE W/ 1V CHEST COMPARISON:  Chest radiograph June 21, 2017 FINDINGS: Cardiac silhouette is mildly enlarged. Mediastinal silhouette is non suspicious. Lungs are clear, no pleural effusions. No pneumothorax. Soft tissue planes and included osseous structures are nonacute. Osteopenia. Degenerative change of the shoulders. Bowel gas pattern is nondilated and nonobstructive. No intra-abdominal mass effect, pathologic calcifications or free air. Soft tissue planes and included osseous structures are non-suspicious. Moderate vascular calcifications. L1 vertebral body cement augmentation. IMPRESSION: Mild cardiomegaly.  No acute pulmonary process. Mild amount of retained large bowel stool. Normal bowel gas pattern. Aortic Atherosclerosis (ICD10-I70.0). Electronically Signed   By: Awilda Metro M.D.   On: 08/01/2017 02:40    Procedures Procedures (including critical care time)  Medications Ordered in ED Medications  0.9 %  sodium chloride infusion ( Intravenous New Bag/Given 08/01/17 0430)  0.9 %  sodium chloride infusion ( Intravenous New Bag/Given 08/01/17 0436)  dextrose 5 %-0.45 % sodium chloride infusion (not administered)  insulin regular (NOVOLIN R,HUMULIN R) 100 Units in sodium chloride 0.9 % 100 mL (1 Units/mL) infusion (not administered)  potassium chloride 10  mEq in 100 mL IVPB (not administered)  ARIPiprazole (ABILIFY) tablet 5 mg (not administered)  lamoTRIgine (LAMICTAL) tablet 100 mg (not administered)  levothyroxine (SYNTHROID, LEVOTHROID) tablet 50 mcg (not administered)  LORazepam (ATIVAN) tablet 0.5 mg (not administered)  metoCLOPramide (REGLAN) tablet 5 mg (not administered)  ondansetron (ZOFRAN) tablet 4 mg (not administered)  sertraline (ZOLOFT) tablet 50 mg (not administered)     Initial Impression / Assessment and Plan / ED Course  I have reviewed the triage vital signs and the nursing notes.  Pertinent labs & imaging results that were available during my care of the patient were reviewed by me and considered in my medical decision making (see chart for details).     Patient presents to the emergency department for evaluation of vomiting.  Patient is brought by EMS from skilled nursing facility.  Patient has been vomiting throughout the day, despite administration of Phenergan by nursing home staff.  Patient has severe dementia, cannot speak or answer any questions at her baseline.  Patient noted to have active vomiting at arrival and it is gross blood.  She is not hypotensive and her hemoglobin is stable for her.  This will need to be monitored.  She is found to be in diabetic ketoacidosis.  She is a hospice patient and has a MOST form.  This indicates that she would like antibiotics, IV fluids and comfort measures.  She likely was sent to the ER because of the intractable vomiting and will require correction of her DKA to stop the vomiting.  She will therefore be admitted to the hospitalist service.  Final Clinical Impressions(s) / ED Diagnoses   Final diagnoses:  Diabetic ketoacidosis without coma associated with other specified diabetes mellitus (HCC)  UGI bleed  Intractable vomiting, presence of nausea not  specified, unspecified vomiting type    ED Discharge Orders    None       Pollina, Canary Brim, MD 08/01/17  [phone]

## 2017-08-01 NOTE — Progress Notes (Signed)
Initial Nutrition Assessment  DOCUMENTATION CODES:   Severe malnutrition in context of chronic illness, Underweight  INTERVENTION:  - Will order Magic Cup TID with meals, each supplement provides 290 kcal and 9 grams of protein - Tech/RN to assist with feeding. - RD will monitor POC/GOC and provide additional interventions as appropriate.  - Pt may benefit from SLP evaluation.   Monitor magnesium, potassium, and phosphorus daily for at least 3 days, MD to replete as needed, as pt is at risk for refeeding syndrome given severe malnutrition, likely very poor nutrition PTA, now receiving D5 IVF.    NUTRITION DIAGNOSIS:   Severe Malnutrition related to chronic illness(end-stage dementia) as evidenced by severe muscle depletion, severe fat depletion.  GOAL:   Patient will meet greater than or equal to 90% of their needs  MONITOR:   PO intake, Supplement acceptance, Weight trends, Labs, Other (Comment)(POC/GOC)  REASON FOR ASSESSMENT:   Malnutrition Screening Tool  ASSESSMENT:   77 y.o. female with medical history significant of DM1, recurrent admits for DKA suspected to be due to poor PO intake associated with end stage dementia.  Patient is DNR/DNI and was hospice care when discharged last admit.  BMI indicates underweight status. Pt noted to be a/o to self only (confirmed by RN) and no family/visitors present. Spoke with RN outside of pt's room. She gave pt applesauce earlier to give PO meds and pt held applesauce in her mouth for awhile before swallowing. Diet was advanced from NPO to Carb Modified today at 11:10 AM but nothing has been provided other than the applesauce. SLP has not seen pt this admission.   Unable to obtain any nutrition-related information as pt does not respond to anything that RD says during visit. Pt was seen by this RD on 06/22/17 and at that time pt was unable to provide information/mumbled throughout RD visit.   NFPE outlined below. Per chart review, pt  has lost 17 lbs (17% body weight) in the past 1-1.5 months. This is significant for time frame. Noted high rate IVF ordered previously (NS @ 125 mL/hr); IVF changed at 8:00 AM today.  Medications reviewed; sliding scale Novolog, 10 units Lantus/day, 50 mcg oral Synthroid/day, 5 mg oral Reglan TID.  Labs reviewed; CBGs: 126-241 mg/dL since 1:61 AM, Na: 096 mmol/L, K: 3.4 mmol/L, Cl: 113 mmol/L.   IVF: D5-1/2 NS @ 75 mL/hr (306 kcal).     NUTRITION - FOCUSED PHYSICAL EXAM:    Most Recent Value  Orbital Region  Unable to assess  Upper Arm Region  Severe depletion  Thoracic and Lumbar Region  Severe depletion  Buccal Region  Unable to assess  Temple Region  Moderate depletion  Clavicle Bone Region  Severe depletion  Clavicle and Acromion Bone Region  Severe depletion  Scapular Bone Region  Unable to assess  Dorsal Hand  Moderate depletion  Patellar Region  Severe depletion  Anterior Thigh Region  Severe depletion  Posterior Calf Region  Severe depletion  Edema (RD Assessment)  None  Hair  Reviewed  Eyes  Reviewed  Mouth  Unable to assess  Skin  Reviewed  Nails  Reviewed       Diet Order:  Diet Carb Modified Fluid consistency: Thin; Room service appropriate? Yes  EDUCATION NEEDS:   No education needs have been identified at this time  Skin:  Skin Assessment: Reviewed RN Assessment  Last BM:  PTA/unknown  Height:   Ht Readings from Last 1 Encounters:  08/01/17 5\' 2"  (1.575 m)  Weight:   Wt Readings from Last 1 Encounters:  08/01/17 80 lb 7.5 oz (36.5 kg)    Ideal Body Weight:  50 kg  BMI:  Body mass index is 14.72 kg/m.  Estimated Nutritional Needs:   Kcal:  1205-1390 (33-38 kcal/kg)  Protein:  55-66 grams (1.5-1.8 grams/kg)  Fluid:  >/= 1.5 L/day      Trenton GammonJessica Czar Ysaguirre, MS, RD, LDN, [facility] Inpatient Clinical Dietitian Pager # [phone] After hours/weekend pager # [phone]

## 2017-08-01 NOTE — Progress Notes (Signed)
Palliative Medicine consult noted.   Spoke with Carley HammedEva with [facility]. This patient is NOT on hospice care, but is being followed by the palliative arm of [facility].  Due to high referral volume, there may be a delay in PMT seeing this patient. Please call the Palliative Medicine Team office at [phone] if recommendations are needed in the interim.  Thank you for inviting us to see this patient.  Margret ChanceMelanie G. Ivy Meriwether, RN, BSN, [facility] Palliative Medicine Team 08/01/2017 9:03 AM Office [phone]

## 2017-08-01 NOTE — ED Notes (Signed)
ED TO INPATIENT HANDOFF REPORT  Name/Age/Gender Beth Payne 77 y.o. female  Code Status    Code Status Orders  (From admission, onward)        Start     Ordered   08/01/17 0424  Do not attempt resuscitation (DNR)  Continuous    Question Answer Comment  In the event of cardiac or respiratory ARREST Do not call a "code blue"   In the event of cardiac or respiratory ARREST Do not perform Intubation, CPR, defibrillation or ACLS   In the event of cardiac or respiratory ARREST Use medication by any route, position, wound care, and other measures to relive pain and suffering. May use oxygen, suction and manual treatment of airway obstruction as needed for comfort.      08/01/17 0423    Code Status History    Date Active Date Inactive Code Status Order ID Comments User Context   06/22/2017 01:04 06/23/2017 20:11 DNR [id number]  Norval Morton, MD ED   05/14/2017 04:36 05/18/2017 19:22 DNR [id number]  Norval Morton, MD ED   05/14/2017 04:27 05/14/2017 04:36 Full Code [id number]  Norval Morton, MD ED   02/02/2016 17:48 02/03/2016 18:46 Full Code [id number]  Thurnell Lose, MD ED   01/19/2016 11:59 01/23/2016 18:56 DNR [id number]  Waldemar Dickens, MD ED   01/19/2016 11:59 01/19/2016 11:59 DNR [id number]  Waldemar Dickens, MD ED   12/14/2015 05:13 12/15/2015 19:33 DNR [id number]  Edwin Dada, MD Inpatient   12/14/2015 05:06 12/14/2015 05:13 DNR [id number]  Edwin Dada, MD Inpatient   11/20/2015 22:31 11/24/2015 16:57 DNR [id number]  [name], DO ED   06/21/2015 15:28 06/27/2015 21:35 DNR [id number]  Modena Jansky, MD Inpatient   06/21/2015 01:13 06/21/2015 15:28 Full Code [id number]  [name], DO ED   05/20/2015 00:42 05/22/2015 20:40 Full Code [id number]  Rise Patience, MD Inpatient   04/24/2015 15:20 04/26/2015 14:54 Full Code [id number]  Radene Gunning, NP Inpatient   02/24/2015 00:20 02/27/2015 18:25 Full Code [id number]  Theressa Millard, MD ED    02/23/2015 21:39 02/24/2015 00:20 Full Code [id number]  Theressa Millard, MD ED   01/01/2015 00:47 01/01/2015 20:25 DNR [id number]  [name], DO ED   01/01/2015 00:44 01/01/2015 00:47 Full Code [id number]  [name], DO ED   12/23/2014 17:52 12/27/2014 17:40 Full Code [id number]  Geradine Girt, DO Inpatient   12/12/2014 13:04 12/16/2014 21:03 DNR [id number]  Samella Parr, NP ED    Advance Directive Documentation     Most Recent Value  Type of Advance Directive  Out of facility DNR (pink MOST or yellow form)  Pre-existing out of facility DNR order (yellow form or pink MOST form)  No data  "MOST" Form in Place?  No data      Home/SNF/Other Skilled nursing facility  Chief Complaint Emesis  Level of Care/Admitting Diagnosis ED Disposition    ED Disposition Condition Comment   Admit  Hospital Area: [facility] [[id number]]  Level of Care: Stepdown [14]  Admit to SDU based on following criteria: Severe physiological/psychological symptoms:  Any diagnosis requiring assessment & intervention at least every 4 hours on an ongoing basis to obtain desired patient outcomes including stability and rehabilitation  Diagnosis: DKA (diabetic ketoacidoses) [facility]) [[id number]]  Admitting Physician: Doreatha Massed  Attending Physician: [name] [phone]  Estimated length of stay: past midnight tomorrow  Certification:: I certify this patient  will need inpatient services for at least 2 midnights  PT Class (Do Not Modify): Inpatient [101]  PT Acc Code (Do Not Modify): Private [1]       Medical History Past Medical History:  Diagnosis Date  . Anxiety   . Arthritis    "fingers" (12/23/2014)  . Chronic lower back pain   . Coronary artery disease   . Dementia   . Depression   . Fibromyalgia   . Fracture, ankle 12/23/2014  . GERD (gastroesophageal reflux disease) dx'd 1995  . Heart murmur   . NSTEMI (non-ST elevated myocardial infarction) ([place]) 12/13/2014  .  Osteopenia dx'd 2007  . Retinopathy   . TIA (transient ischemic attack)   . Type I diabetes mellitus ([place]) dx'd 1953   "I'm on a pup" (12/23/2014)    Allergies Allergies  Allergen Reactions  . Diclofenac Sodium Swelling and Other (See Comments)    Blurred vision  . Doxycycline Calcium Swelling and Other (See Comments)    Blurred vision  . Calcium-Containing Compounds Other (See Comments)    Calcium listed as an allergy on [facility] 08/10/15  . Codeine Nausea And Vomiting and Other (See Comments)    Dizziness   . Zocor [Simvastatin] Other (See Comments)    Dizziness, weakness     IV Location/Drains/Wounds Patient Lines/Drains/Airways Status   Active Line/Drains/Airways    Name:   Placement date:   Placement time:   Site:   Days:   Peripheral IV 08/01/17 Left Antecubital   08/01/17    -    Antecubital   less than 1   Peripheral IV 08/01/17 Right Forearm   08/01/17    0445    Forearm   less than 1          Labs/Imaging Results for orders placed or performed during the hospital encounter of 08/01/17 (from the past 48 hour(s))  Blood gas, venous     Status: Abnormal   Collection Time: 08/01/17  1:23 AM  Result Value Ref Range   O2 Content ROOM AIR L/min   Delivery systems ROOM AIR    pH, Ven 7.334 7.250 - 7.430   pCO2, Ven 36.3 (L) 44.0 - 60.0 mmHg   pO2, Ven  32.0 - 45.0 mmHg    CRITICAL RESULT CALLED TO, READ BACK BY AND VERIFIED WITH:    Comment: VALUE BELOW REPORTABLE RANGE Talbert Cage, RN AT 0130 BY ANNALISSA AGUSTIN,RRT,RCP ON 08/01/17    Bicarbonate 18.8 (L) 20.0 - 28.0 mmol/L   Acid-base deficit 6.0 (H) 0.0 - 2.0 mmol/L   O2 Saturation 26.9 %   Patient temperature 98.1    Collection site VEIN    Drawn by COLLECTED BY NURSE    Sample type VENOUS     Comment: Performed at [facility] [address] [place], [place] [address]  CBC with Differential/Platelet     Status: Abnormal   Collection Time: 08/01/17  1:29 AM  Result Value Ref Range   WBC  13.0 (H) 4.0 - 10.5 K/uL   RBC 3.82 (L) 3.87 - 5.11 MIL/uL   Hemoglobin 10.8 (L) 12.0 - 15.0 g/dL   HCT 32.5 (L) 36.0 - 46.0 %   MCV 85.1 78.0 - 100.0 fL   MCH 28.3 26.0 - 34.0 pg   MCHC 33.2 30.0 - 36.0 g/dL   RDW 15.9 (H) 11.5 - 15.5 %   Platelets 279 150 - 400 K/uL   Neutrophils Relative % 88 %   Neutro Abs 11.5 (H)  1.7 - 7.7 K/uL   Lymphocytes Relative 7 %   Lymphs Abs 0.9 0.7 - 4.0 K/uL   Monocytes Relative 5 %   Monocytes Absolute 0.7 0.1 - 1.0 K/uL   Eosinophils Relative 0 %   Eosinophils Absolute 0.0 0.0 - 0.7 K/uL   Basophils Relative 0 %   Basophils Absolute 0.0 0.0 - 0.1 K/uL    Comment: Performed at [facility], [place] [address] [place], [place] [address]  Comprehensive metabolic panel     Status: Abnormal   Collection Time: 08/01/17  1:29 AM  Result Value Ref Range   Sodium 147 (H) 135 - 145 mmol/L   Potassium 3.8 3.5 - 5.1 mmol/L   Chloride 107 101 - 111 mmol/L   CO2 16 (L) 22 - 32 mmol/L   Glucose, Bld 318 (H) 65 - 99 mg/dL   BUN 17 6 - 20 mg/dL   Creatinine, Ser 0.90 0.44 - 1.00 mg/dL   Calcium 10.5 (H) 8.9 - 10.3 mg/dL   Total Protein 7.1 6.5 - 8.1 g/dL   Albumin 4.2 3.5 - 5.0 g/dL   AST 27 15 - 41 U/L   ALT 21 14 - 54 U/L   Alkaline Phosphatase 90 38 - 126 U/L   Total Bilirubin 1.3 (H) 0.3 - 1.2 mg/dL   GFR calc non Af Amer >60 >60 mL/min   GFR calc Af Amer >60 >60 mL/min    Comment: (NOTE) The eGFR has been calculated using the CKD EPI equation. This calculation has not been validated in all clinical situations. eGFR's persistently <60 mL/min signify possible Chronic Kidney Disease. CORRECTED ON 02/25 AT 0410: PREVIOUSLY REPORTED AS >60    Anion gap 24 (H) 5 - 15    Comment: Performed at [facility], [place] [address], [place], [place] [address]  Lipase, blood     Status: None   Collection Time: 08/01/17  1:29 AM  Result Value Ref Range   Lipase 21 11 - 51 U/L    Comment: Performed at [facility], [place] [address] [place], [place] [address]  Beta-hydroxybutyric acid     Status: Abnormal   Collection Time: 08/01/17  1:29 AM  Result Value Ref Range   Beta-Hydroxybutyric Acid >8.00 (H) 0.05 - 0.27 mmol/L    Comment: RESULTS CONFIRMED BY MANUAL DILUTION Performed at [facility] [address], [place], [place] [address]   Protime-INR     Status: None   Collection Time: 08/01/17  1:29 AM  Result Value Ref Range   Prothrombin Time 13.7 11.4 - 15.2 seconds   INR 1.06     Comment: Performed at [facility], [place] [address], [place], [place] [address]  Type and screen     Status: None   Collection Time: 08/01/17  1:29 AM  Result Value Ref Range   ABO/RH(D) AB POS    Antibody Screen NEG    Sample Expiration      08/04/2017 Performed at [facility], [place] [address], [place], [place] [address]   I-Stat CG4 Lactic Acid, ED     Status: None   Collection Time: 08/01/17  1:34 AM  Result Value Ref Range   Lactic Acid, Venous 1.87 0.5 - 1.9 mmol/L  Urinalysis, Routine w reflex microscopic     Status: Abnormal   Collection Time: 08/01/17  1:49 AM  Result Value Ref Range   Color, Urine YELLOW YELLOW   APPearance CLEAR CLEAR  Specific Gravity, Urine 1.018 1.005 - 1.030   pH 5.0 5.0 - 8.0   Glucose, UA >=500 (A) NEGATIVE mg/dL   Hgb urine dipstick SMALL (A) NEGATIVE   Bilirubin Urine NEGATIVE NEGATIVE   Ketones, ur 80 (A) NEGATIVE mg/dL   Protein, ur NEGATIVE NEGATIVE mg/dL   Nitrite NEGATIVE NEGATIVE   Leukocytes, UA NEGATIVE NEGATIVE   RBC / HPF 0-5 0 - 5 RBC/hpf   WBC, UA 6-30 0 - 5 WBC/hpf   Bacteria, UA RARE (A) NONE SEEN   Squamous Epithelial / LPF NONE SEEN NONE SEEN    Comment: Performed at [facility], [place] [address], [place], [place] [address]  POC occult blood, ED RN will collect     Status: Abnormal   Collection Time: 08/01/17  2:02 AM  Result Value Ref Range   Fecal Occult Bld  POSITIVE (A) NEGATIVE  CBG monitoring, ED     Status: Abnormal   Collection Time: 08/01/17  4:29 AM  Result Value Ref Range   Glucose-Capillary 241 (H) 65 - 99 mg/dL   Dg Abd Acute W/chest  Result Date: 08/01/2017 CLINICAL DATA:  Coffee ground emesis since yesterday. EXAM: DG ABDOMEN ACUTE W/ 1V CHEST COMPARISON:  Chest radiograph June 21, 2017 FINDINGS: Cardiac silhouette is mildly enlarged. Mediastinal silhouette is non suspicious. Lungs are clear, no pleural effusions. No pneumothorax. Soft tissue planes and included osseous structures are nonacute. Osteopenia. Degenerative change of the shoulders. Bowel gas pattern is nondilated and nonobstructive. No intra-abdominal mass effect, pathologic calcifications or free air. Soft tissue planes and included osseous structures are non-suspicious. Moderate vascular calcifications. L1 vertebral body cement augmentation. IMPRESSION: Mild cardiomegaly.  No acute pulmonary process. Mild amount of retained large bowel stool. Normal bowel gas pattern. Aortic Atherosclerosis (ICD10-I70.0). Electronically Signed   By: Elon Alas M.D.   On: 08/01/2017 02:40    Pending Labs Unresulted Labs (From admission, onward)   Start     Ordered   08/01/17 4128  Basic metabolic panel  STAT Now then every 4 hours ,   STAT     08/01/17 0423   08/01/17 0247  Urine Culture  Once,   R     08/01/17 0246   08/01/17 0129  ABO/Rh  Once,   R     08/01/17 0129      Vitals/Pain Today's Vitals   08/01/17 0330 08/01/17 0426 08/01/17 0430 08/01/17 0500  BP: (!) 146/71  (!) 172/78 (!) 163/71  Pulse: (!) 103  (!) 110 (!) 105  Resp:   (!) 21 16  Temp:      TempSrc:      SpO2: 100%  99% 100%  Weight:  95 lb (43.1 kg)    Height:  5' 2"  (1.575 m)      Isolation Precautions No active isolations  Medications Medications  0.9 %  sodium chloride infusion ( Intravenous Stopped 08/01/17 0458)  0.9 %  sodium chloride infusion ( Intravenous New Bag/Given 08/01/17 0436)   dextrose 5 %-0.45 % sodium chloride infusion (not administered)  insulin regular (NOVOLIN R,HUMULIN R) 100 Units in sodium chloride 0.9 % 100 mL (1 Units/mL) infusion (1.8 Units/hr Intravenous New Bag/Given 08/01/17 0451)  potassium chloride 10 mEq in 100 mL IVPB (10 mEq Intravenous New Bag/Given 08/01/17 0456)  ARIPiprazole (ABILIFY) tablet 5 mg (not administered)  lamoTRIgine (LAMICTAL) tablet 100 mg (not administered)  levothyroxine (SYNTHROID, LEVOTHROID) tablet 50 mcg (not administered)  LORazepam (ATIVAN) tablet 0.5 mg (not administered)  metoCLOPramide (REGLAN)  tablet 5 mg (not administered)  ondansetron (ZOFRAN) tablet 4 mg (not administered)  sertraline (ZOLOFT) tablet 50 mg (not administered)    Mobility manual wheelchair

## 2017-08-01 NOTE — Progress Notes (Addendum)
Subjective: Patient admitted this morning, see detailed H&P by Dr Julian ReilGardner  77 y.o. female with medical history significant of DM1, recurrent admits for DKA suspected to be due to poor PO intake associated with end stage dementia.  Patient is DNR/DNI and was hospice care when discharged last admit.  At the SNF today patient developed intractable nausea and vomiting.  Multiple rounds of phenergan provided no relief.  Vomiting ultimately turned bloody with small amounts of bloody emesis.  EMS called.  EMS brought patient to ED.  Patient is lethargic this morning  Vitals:   08/01/17 0900 08/01/17 1000  BP: (!) 173/91 (!) 154/59  Pulse: (!) 109 87  Resp: 16 13  Temp:    SpO2: 100% 100%      A/P  Diabetic ketoacidosis End-stage dementia Hematemesis  Hematemesis, likely from Mallory-Weiss tear, seems to have resolved, patient is on hospice, no GI will be  consulted. Continue IV Protonix.  DKA resolved, will start Lantus, sliding scale insulin with NovoLog.  Discontinue IV insulin.   Meredeth IdeGagan S Jacey Eckerson [facility] [phone]- [phone]

## 2017-08-02 ENCOUNTER — Other Ambulatory Visit: Payer: Self-pay

## 2017-08-02 DIAGNOSIS — F0390 Unspecified dementia without behavioral disturbance: Secondary | ICD-10-CM | POA: Diagnosis not present

## 2017-08-02 DIAGNOSIS — G4700 Insomnia, unspecified: Secondary | ICD-10-CM | POA: Diagnosis not present

## 2017-08-02 DIAGNOSIS — F331 Major depressive disorder, recurrent, moderate: Secondary | ICD-10-CM | POA: Diagnosis not present

## 2017-08-02 DIAGNOSIS — F411 Generalized anxiety disorder: Secondary | ICD-10-CM | POA: Diagnosis not present

## 2017-08-02 LAB — GLUCOSE, CAPILLARY
GLUCOSE-CAPILLARY: 242 mg/dL — AB (ref 65–99)
GLUCOSE-CAPILLARY: 206 mg/dL — AB (ref 65–99)
Glucose-Capillary: 191 mg/dL — ABNORMAL HIGH (ref 65–99)

## 2017-08-02 LAB — CBC
HCT: 37.7 % (ref 36.0–46.0)
HEMOGLOBIN: 12.9 g/dL (ref 12.0–15.0)
MCH: 27.9 pg (ref 26.0–34.0)
MCHC: 34.2 g/dL (ref 30.0–36.0)
MCV: 81.6 fL (ref 78.0–100.0)
Platelets: 262 10*3/uL (ref 150–400)
RBC: 4.62 MIL/uL (ref 3.87–5.11)
RDW: 15.5 % (ref 11.5–15.5)
WBC: 12.3 10*3/uL — AB (ref 4.0–10.5)

## 2017-08-02 LAB — HEMOGLOBIN A1C
HEMOGLOBIN A1C: 8.8 % — AB (ref 4.8–5.6)
MEAN PLASMA GLUCOSE: 206 mg/dL

## 2017-08-02 LAB — URINE CULTURE: Culture: NO GROWTH

## 2017-08-02 MED ORDER — PANTOPRAZOLE SODIUM 40 MG IV SOLR
40.0000 mg | Freq: Two times a day (BID) | INTRAVENOUS | Status: DC
  Administered 2017-08-02 – 2017-08-03 (×3): 40 mg via INTRAVENOUS
  Filled 2017-08-02 (×3): qty 40

## 2017-08-02 MED ORDER — METOPROLOL TARTRATE 12.5 MG HALF TABLET: 12.5000 mg | ORAL_TABLET | Freq: Two times a day (BID) | ORAL | Status: DC

## 2017-08-02 MED ORDER — ONDANSETRON HCL 4 MG/2ML IJ SOLN: 4.0000 mg | Freq: Four times a day (QID) | INTRAMUSCULAR | Status: DC | PRN

## 2017-08-02 MED ORDER — SENNOSIDES-DOCUSATE SODIUM 8.6-50 MG PO TABS
1.0000 | ORAL_TABLET | Freq: Two times a day (BID) | ORAL | Status: DC
  Administered 2017-08-02 (×2): 1 via ORAL
  Filled 2017-08-02 (×3): qty 1

## 2017-08-02 NOTE — Progress Notes (Signed)
Inpatient Diabetes Program Recommendations  AACE/ADA: New Consensus Statement on Inpatient Glycemic Control (2015)  Target Ranges:  Prepandial:   less than 140 mg/dL      Peak postprandial:   less than 180 mg/dL (1-2 hours)      Critically ill patients:  140 - 180 mg/dL   Results for Beth Payne, Beth Payne (MRN [id number]) as of 08/02/2017 10:58  Ref. Range 08/01/2017 09:01 08/01/2017 10:22 08/01/2017 11:46 08/01/2017 13:24 08/01/2017 17:00 08/01/2017 17:32 08/01/2017 18:06 08/01/2017 21:41  Glucose-Capillary Latest Ref Range: 65 - 99 mg/dL 782137 (H) 956140 (H) 213126 (H)  10 units Lantus 135 (H)  1 unit Novolog 47 (L) 58 (L) 148 (H) 75   Results for Beth Payne, Beth Payne (MRN [id number]) as of 08/02/2017 10:58  Ref. Range 08/02/2017 07:13  Glucose-Capillary Latest Ref Range: 65 - 99 mg/dL 629242 (H)  3 units Novolog    Admit: DKA  History: Type 1 DM  SNF Meds: Levemir 8 units BID                     Novolog 0-9 units TID per SSI  Current Orders: Lantus 10 units QHS   Novolog Sensitive Correction Scale/ SSI (0-9 units) TID AC     MD- Please consider reducing Lantus slightly to 4 units BID (50% home dose)  Please give 1st dose today.    --Will follow patient during hospitalization--  Ambrose FinlandJeannine Johnston Ovella Manygoats RN, MSN, CDE Diabetes Coordinator Inpatient Glycemic Control Team Team Pager: [phone] (8a-5p)

## 2017-08-02 NOTE — Progress Notes (Addendum)
Chart reviewed and patient assessment completed. No family or friends at bedside. Patient with baseline dementia. Per chart review, friend Beth Payne is documented HCPOA. MOST form reviewed in chart. [facility]. [facility] but [facility]. Voicemail left for [name] to discuss goals of care and hospice services. Awaiting return call. Will follow.   NO CHARGE  Beth HomansMegan Mycah Mcdougall, FNP-C Palliative Medicine Team  Phone: [phone] Fax: [phone]

## 2017-08-02 NOTE — Progress Notes (Signed)
[facility]  PROGRESS NOTE  TERREN Payne MVH:[id number] DOB: [birth year] DOA: 08/01/2017 PCP: Patient, No Pcp Per   Brief HPI:   .femalewith medical history significant ofDM1, recurrent admits for DKA suspected to be due to poor PO intake associated with end stage dementia. Patient is DNR/DNI and was hospice care when discharged last admit.  At the SNF today patient developed intractable nausea and vomiting. Multiple rounds of phenergan provided no relief. Vomiting ultimately turned bloody with small amounts of bloody emesis. EMS called. EMS brought patient to ED   Subjective   Patient seen and examined, continues to have small coffee-ground emesis.  Blood pressure dropped after starting metoprolol.   Assessment/Plan:     1. Diabetic ketoacidosis-resolved, patient is off IV insulin, started on Lantus 10 units subcu daily, sliding scale insulin with NovoLog. 2. Hematemesis-patient having intermittent episodes of hematemesis, continue Zofran as needed for nausea vomiting.  Hemoglobin has in fact improved since yesterday.  Today hemoglobin is 12.9.  GI was not consulted as patient was on hospice at skilled facility.  I have consulted palliative medicine team for further clarification of goals.  Patient is clearly not a candidate for any invasive intervention including EGD or colonoscopy.  We will continue with IV Protonix 40 mg every 12 hours 3. Hypertension-blood pressure was elevated yesterday, she was started on metoprolol 25 mg twice a day along with amlodipine 10 mg p.o. daily.  Blood pressure dropped this morning so we have discontinued metoprolol.  Continue with amlodipine 10 mg p.o. Daily. 4. Severe dementia-continue Abilify 5. Diabetes mellitus- blood glucose is well controlled, continue sliding scale insulin with NovoLog, Lantus 10 units subcu daily    DVT prophylaxis: SCD  Code Status: DNR  Family Communication: No family at bedside  Disposition Plan: To be  decided based upon palliative care input   Consultants:  None  Procedures:  None   Antibiotics:   Anti-infectives (From admission, onward)   None       Objective   Vitals:   08/02/17 0800 08/02/17 0805 08/02/17 0900 08/02/17 1100  BP:  (!) 108/59 (!) 108/59 126/61  Pulse:  72 96 84  Resp:  (!) 9 20 17   Temp: 98.3 F (36.8 C)     TempSrc: Axillary     SpO2:  99% 100% 98%  Weight:      Height:        Intake/Output Summary (Last 24 hours) at 08/02/2017 1255 Last data filed at 08/02/2017 1100 Gross per 24 hour  Intake 424.38 ml  Output -  Net 424.38 ml   Filed Weights   08/01/17 0426 08/01/17 0544  Weight: 43.1 kg (95 lb) 36.5 kg (80 lb 7.5 oz)     Physical Examination:   Physical Exam: Eyes: No icterus, extraocular muscles intact  Mouth: Oral mucosa is moist, no lesions on palate,  Neck: Supple, no deformities, masses, or tenderness Lungs: Normal respiratory effort, bilateral clear to auscultation, no crackles or wheezes.  Heart: Regular rate and rhythm, S1 and S2 normal, no murmurs, rubs auscultated Abdomen: BS normoactive,soft,nondistended,non-tender to palpation,no organomegaly Extremities: No pretibial edema, no erythema, no cyanosis, no clubbing Neuro : Alert, but not oriented x3 Skin: No rashes seen on exam    Data Reviewed: I have personally reviewed following labs and imaging studies  CBG: Recent Labs  Lab 08/01/17 1700 08/01/17 1732 08/01/17 1806 08/01/17 2141 08/02/17 0713  GLUCAP 47* 58* 148* 75 242*    CBC: Recent Labs  Lab 08/01/17 0129  08/02/17 1112  WBC 13.0* 12.3*  NEUTROABS 11.5*  --   HGB 10.8* 12.9  HCT 32.5* 37.7  MCV 85.1 81.6  PLT 279 262    Basic Metabolic Panel: Recent Labs  Lab 08/01/17 0129 08/01/17 0637 08/01/17 0951  NA 147* 149* 147*  K 3.8 3.7 3.4*  CL 107 114* 113*  CO2 16* 20* 21*  GLUCOSE 318* 159* 162*  BUN 17 15 13   CREATININE 0.90 0.72 0.70  CALCIUM 10.5* 10.1 9.6    Recent Results  (from the past 240 hour(s))  Urine Culture     Status: None   Collection Time: 08/01/17  1:49 AM  Result Value Ref Range Status   Specimen Description   Final    URINE, CATHETERIZED Performed at [facility], [address] [place], [place] [address]    Special Requests   Final    NONE Performed at [facility], [address] [place], [place] [address]    Culture   Final    NO GROWTH Performed at [facility], [address] [place], [place] [address]    Report Status 08/02/2017 FINAL  Final  MRSA PCR Screening     Status: None   Collection Time: 08/01/17  5:34 AM  Result Value Ref Range Status   MRSA by PCR NEGATIVE NEGATIVE Final    Comment:        The GeneXpert MRSA Assay (FDA approved for NASAL specimens only), is one component of a comprehensive MRSA colonization surveillance program. It is not intended to diagnose MRSA infection nor to guide or monitor treatment for MRSA infections. Performed at [facility], [address] [place], [place] [address]      Liver Function Tests: Recent Labs  Lab 08/01/17 0129  AST 27  ALT 21  ALKPHOS 90  BILITOT 1.3*  PROT 7.1  ALBUMIN 4.2   Recent Labs  Lab 08/01/17 0129  LIPASE 21   No results for input(s): AMMONIA in the last 168 hours.  Cardiac Enzymes: No results for input(s): CKTOTAL, CKMB, CKMBINDEX, TROPONINI in the last 168 hours. BNP (last 3 results) No results for input(s): BNP in the last 8760 hours.  ProBNP (last 3 results) No results for input(s): PROBNP in the last 8760 hours.    Studies: Dg Abd Acute W/chest  Result Date: 08/01/2017 CLINICAL DATA:  Coffee ground emesis since yesterday. EXAM: DG ABDOMEN ACUTE W/ 1V CHEST COMPARISON:  Chest radiograph June 21, 2017 FINDINGS: Cardiac silhouette is mildly enlarged. Mediastinal silhouette is non suspicious. Lungs are clear, no pleural effusions. No pneumothorax. Soft tissue planes  and included osseous structures are nonacute. Osteopenia. Degenerative change of the shoulders. Bowel gas pattern is nondilated and nonobstructive. No intra-abdominal mass effect, pathologic calcifications or free air. Soft tissue planes and included osseous structures are non-suspicious. Moderate vascular calcifications. L1 vertebral body cement augmentation. IMPRESSION: Mild cardiomegaly.  No acute pulmonary process. Mild amount of retained large bowel stool. Normal bowel gas pattern. Aortic Atherosclerosis (ICD10-I70.0). Electronically Signed   By: Awilda Metroourtnay  Bloomer M.D.   On: 08/01/2017 02:40    Scheduled Meds: . amLODipine  10 mg Oral Daily  . ARIPiprazole  5 mg Oral Daily  . insulin aspart  0-9 Units Subcutaneous TID WC  . insulin glargine  10 Units Subcutaneous QHS  . lamoTRIgine  100 mg Oral QHS  . levothyroxine  50 mcg Oral QAC breakfast  . LORazepam  0.5 mg Oral QHS  . metoCLOPramide  5 mg Oral TID AC  . multivitamin with minerals  1 tablet Oral Daily  . senna-docusate  1 tablet Oral BID  . sertraline  50 mg Oral BID      Time spent: 25 min  Meredeth Ide   [facility] Pager [phone]. If 7PM-7AM, please contact night-coverage at [email or web address], Office  [phone]  password [email or web address]  08/02/2017, 12:55 PM  LOS: 1 day

## 2017-08-03 DIAGNOSIS — R1110 Vomiting, unspecified: Secondary | ICD-10-CM

## 2017-08-03 DIAGNOSIS — E1051 Type 1 diabetes mellitus with diabetic peripheral angiopathy without gangrene: Secondary | ICD-10-CM

## 2017-08-03 DIAGNOSIS — E1010 Type 1 diabetes mellitus with ketoacidosis without coma: Principal | ICD-10-CM

## 2017-08-03 DIAGNOSIS — E1310 Other specified diabetes mellitus with ketoacidosis without coma: Secondary | ICD-10-CM

## 2017-08-03 DIAGNOSIS — Z515 Encounter for palliative care: Secondary | ICD-10-CM

## 2017-08-03 LAB — GLUCOSE, CAPILLARY
GLUCOSE-CAPILLARY: 241 mg/dL — AB (ref 65–99)
Glucose-Capillary: 316 mg/dL — ABNORMAL HIGH (ref 65–99)
Glucose-Capillary: 221 mg/dL — ABNORMAL HIGH (ref 65–99)
Glucose-Capillary: 288 mg/dL — ABNORMAL HIGH (ref 65–99)

## 2017-08-03 MED ORDER — LORAZEPAM 0.5 MG PO TABS: 0.5000 mg | ORAL_TABLET | Freq: Every day | ORAL | 0 refills | Status: AC

## 2017-08-03 MED ORDER — INSULIN GLARGINE 100 UNIT/ML ~~LOC~~ SOLN: 10.0000 [IU] | Freq: Every day | SUBCUTANEOUS | 11 refills | Status: AC

## 2017-08-03 MED ORDER — ADULT MULTIVITAMIN W/MINERALS CH: 1.0000 | ORAL_TABLET | Freq: Every day | ORAL | 0 refills | Status: AC

## 2017-08-03 NOTE — Discharge Summary (Signed)
Physician Discharge Summary  Theron Aristahomasine C Lumb WUJ:[id number] DOB: 09/13/1940 DOA: 08/01/2017  PCP: Patient, No Pcp Per  Admit date: 08/01/2017 Discharge date: 08/03/2017  Admitted From: snf Disposition:  snf  Recommendations for Outpatient Follow-up and new medication changes:  1. Follow up with PCP in 1-2 weeks 2. Avoid QT prolonging medications.  Home Health: na Equipment/Devices: na   Discharge Condition: Stable CODE STATUS: full  Diet recommendation: Diabetic prudent  Brief/Interim Summary: 77 year old female who presented with intractable nausea and vomiting.  Patient does have a significant past medical history of type 1 diabetes mellitus, advanced dementia, coronary artery disease, depression, and GERD.  Her symptoms were refractory to Phenergan, after multiple episodes of vomiting she had hematemesis.  On initial physical examination blood pressure 140/66, heart rate 102, respiratory 15, oxygen saturation 99%.  Moist mucous membranes, lungs are clear to auscultation bilaterally, heart S1-S2 present rhythmic, the abdomen was soft nontender, no lower extremity edema.  Patient was nonverbal and very deconditioned.  Sodium 147, potassium 3.8, chloride 107, bicarb 16, glucose 318, BUN 17, creatinine 0.90, anion gap 24, white cell count 13.0, hemoglobin 10.8, hematocrit 32.5, platelets 279.  Urinalysis greater than 500 glucose, white cells 6-30, specific gravity 1.018, protein negative.  Fecal occult blood was positive.  Chest x-ray had right rotation, signs of air trapping, no infiltrates.  EKG sinus rhythm, 109 bpm, right bundle branch block.   Patient was admitted to the hospital for diagnosis of diabetes ketoacidosis  1.  Diabetes ketoacidosis.  She was placed on intravenous fluids, continuous infusion of IV insulin with improvement of her symptoms.  Patient was successfully transitioned to subcutaneous insulin, long-acting 10 units of Lantus, plus insulin sliding scale with short acting  insulin.   2.  Intractable nausea and vomiting with hematemesis.  Symptoms improved with correction of her acidosis, she receives as needed antiemetics.  Her hemoglobin and hematocrit remained stable, likely Mallory-Weiss tear.  Antiacid therapy with pantoprazole.  3.  Hypertension.  Pressure was closely monitored, patient was placed on amlodipine with good toleration.  Discharge blood pressure 108.  4.  Advanced dementia.  Patient required restraints due to agitation and confusion.  Abilify was continued.  Patient's symptoms improved with supportive medical therapy.  She has been off restraints for the last 24 hours.  She will return to skilled nursing facility under palliative care services.   5.  Type 1 diabetes mellitus.  Will continue insulin regimen with long-acting plus insulin sliding scale short-acting.  Patient does have a poor oral intake.  She will need a close follow-up as an outpatient.  6.  Hypothyroidism.  Continue levothyroxine.   7.  Protein calorie malnutrition.  severe, continue encourage p.o. intake, continue multivitamins.  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 (HCC)   Dementia   Hospice care patient    Discharge Instructions   Allergies as of 08/03/2017      Reactions   Diclofenac Sodium Swelling, Other (See Comments)   Blurred vision   Doxycycline Calcium Swelling, Other (See Comments)   Blurred vision   Calcium-containing Compounds Other (See Comments)   Calcium listed as an allergy on [facility] 08/10/15   Codeine Nausea And Vomiting, Other (See Comments)   Dizziness    Zocor [simvastatin] Other (See Comments)   Dizziness, weakness       Medication List    STOP taking these medications   insulin detemir 100  UNIT/ML injection Commonly known as:  LEVEMIR   metoCLOPramide 5 MG tablet Commonly known as:  REGLAN   ondansetron 4 MG  tablet Commonly known as:  ZOFRAN   PHENERGAN 25 MG/ML injection Generic drug:  promethazine     TAKE these medications   amLODipine 10 MG tablet Commonly known as:  NORVASC Take 1 tablet (10 mg total) by mouth daily.   ARIPiprazole 5 MG tablet Commonly known as:  ABILIFY Take 1 tablet (5 mg total) by mouth every morning. What changed:  when to take this   GLUCAGON EMERGENCY 1 MG injection Generic drug:  glucagon Inject 1 mg into the vein daily as needed (low blood sugar).   insulin aspart 100 UNIT/ML injection Commonly known as:  novoLOG Inject at bedtime based upon sugars.  Sugar 0-120--no units; 121-150--1 unit;  151-200--2 units;  201-250--3 units; 251-300--5 units;  301-350--7 units;  351-400--9 units What changed:    how much to take  how to take this  when to take this  additional instructions   insulin glargine 100 UNIT/ML injection Commonly known as:  LANTUS Inject 0.1 mLs (10 Units total) into the skin at bedtime.   lamoTRIgine 25 MG tablet Commonly known as:  LAMICTAL Take 100 mg by mouth at bedtime.   levothyroxine 50 MCG tablet Commonly known as:  SYNTHROID, LEVOTHROID Take 50 mcg by mouth daily before breakfast.   LORazepam 0.5 MG tablet Commonly known as:  ATIVAN Take 1 tablet (0.5 mg total) by mouth at bedtime. Anxiety   magnesium hydroxide 400 MG/5ML suspension Commonly known as:  MILK OF MAGNESIA Take 30 mLs by mouth daily as needed for mild constipation.   Melatonin 3 MG Tabs Take 3 mg by mouth at bedtime.   multivitamin with minerals Tabs tablet Take 1 tablet by mouth daily.   OVER THE COUNTER MEDICATION Take 120 mLs by mouth 2 (two) times daily. Medpass 2.0 dietary supplement   pantoprazole 40 MG tablet Commonly known as:  PROTONIX Take 40 mg by mouth daily before breakfast.   polyethylene glycol packet Commonly known as:  MIRALAX / GLYCOLAX Take 17 g by mouth daily. What changed:  additional instructions   prazosin 1 MG  capsule Commonly known as:  MINIPRESS Take 1 mg by mouth at bedtime.   sertraline 50 MG tablet Commonly known as:  ZOLOFT Take 50 mg by mouth 2 (two) times daily.       Allergies  Allergen Reactions  . Diclofenac Sodium Swelling and Other (See Comments)    Blurred vision  . Doxycycline Calcium Swelling and Other (See Comments)    Blurred vision  . Calcium-Containing Compounds Other (See Comments)    Calcium listed as an allergy on [facility] 08/10/15  . Codeine Nausea And Vomiting and Other (See Comments)    Dizziness   . Zocor [Simvastatin] Other (See Comments)    Dizziness, weakness     Consultations:    Procedures/Studies: Dg Abd Acute W/chest  Result Date: 08/01/2017 CLINICAL DATA:  Coffee ground emesis since yesterday. EXAM: DG ABDOMEN ACUTE W/ 1V CHEST COMPARISON:  Chest radiograph June 21, 2017 FINDINGS: Cardiac silhouette is mildly enlarged. Mediastinal silhouette is non suspicious. Lungs are clear, no pleural effusions. No pneumothorax. Soft tissue planes and included osseous structures are nonacute. Osteopenia. Degenerative change of the shoulders. Bowel gas pattern is nondilated and nonobstructive. No intra-abdominal mass effect, pathologic calcifications or free air. Soft tissue planes and included osseous structures are non-suspicious. Moderate vascular calcifications. L1 vertebral body cement  augmentation. IMPRESSION: Mild cardiomegaly.  No acute pulmonary process. Mild amount of retained large bowel stool. Normal bowel gas pattern. Aortic Atherosclerosis (ICD10-I70.0). Electronically Signed   By: Awilda Metro M.D.   On: 08/01/2017 02:40       Subjective: Patient is feeling better, she has been off restrains for the last 24 hours, no further agitation, poor oral intake.   Discharge Exam: Vitals:   08/03/17 0726 08/03/17 0800  BP: 108/83   Pulse: 77   Resp: (!) 22   Temp:  98.8 F (37.1 C)  SpO2: 99%    Vitals:   08/03/17 0200 08/03/17 0332 08/03/17  0726 08/03/17 0800  BP: (!) 137/59 (!) 145/75 108/83   Pulse: 89 84 77   Resp: 19 (!) 35 (!) 22   Temp:  99 F (37.2 C)  98.8 F (37.1 C)  TempSrc:  Oral  Oral  SpO2: 97% 97% 99%   Weight:      Height:        General: Not in pain or dyspnea, deconditioned Neurology: Awake and alert, non focal  E ENT: mild  pallor, no icterus, oral mucosa moist Cardiovascular: No JVD. S1-S2 present, rhythmic, no gallops, rubs, or murmurs. No lower extremity edema. Pulmonary: vesicular breath sounds bilaterally, adequate air movement, no wheezing, rhonchi or rales. Gastrointestinal. Abdomen flat, no organomegaly, non tender, no rebound or guarding Skin. No rashes Musculoskeletal: no joint deformities   The results of significant diagnostics from this hospitalization (including imaging, microbiology, ancillary and laboratory) are listed below for reference.     Microbiology: Recent Results (from the past 240 hour(s))  Urine Culture     Status: None   Collection Time: 08/01/17  1:49 AM  Result Value Ref Range Status   Specimen Description   Final    URINE, CATHETERIZED Performed at [facility], [address] [place], [place] [address]    Special Requests   Final    NONE Performed at [facility], [address], [place], [place] [address]    Culture   Final    NO GROWTH Performed at [facility], [address], [place], [place] [address]    Report Status 08/02/2017 FINAL  Final  MRSA PCR Screening     Status: None   Collection Time: 08/01/17  5:34 AM  Result Value Ref Range Status   MRSA by PCR NEGATIVE NEGATIVE Final    Comment:        The GeneXpert MRSA Assay (FDA approved for NASAL specimens only), is one component of a comprehensive MRSA colonization surveillance program. It is not intended to diagnose MRSA infection nor to guide or monitor treatment for MRSA infections. Performed at [facility], [address] [place], [place] [address]      Labs: BNP (last 3 results) No results for input(s): BNP in the last 8760 hours. Basic Metabolic Panel: Recent Labs  Lab 08/01/17 0129 08/01/17 0637 08/01/17 0951  NA 147* 149* 147*  K 3.8 3.7 3.4*  CL 107 114* 113*  CO2 16* 20* 21*  GLUCOSE 318* 159* 162*  BUN 17 15 13   CREATININE 0.90 0.72 0.70  CALCIUM 10.5* 10.1 9.6   Liver Function Tests: Recent Labs  Lab 08/01/17 0129  AST 27  ALT 21  ALKPHOS 90  BILITOT 1.3*  PROT 7.1  ALBUMIN 4.2   Recent Labs  Lab 08/01/17 0129  LIPASE 21   No results for input(s): AMMONIA in the  last 168 hours. CBC: Recent Labs  Lab 08/01/17 0129 08/02/17 1112  WBC 13.0* 12.3*  NEUTROABS 11.5*  --   HGB 10.8* 12.9  HCT 32.5* 37.7  MCV 85.1 81.6  PLT 279 262   Cardiac Enzymes: No results for input(s): CKTOTAL, CKMB, CKMBINDEX, TROPONINI in the last 168 hours. BNP: Invalid input(s): POCBNP CBG: Recent Labs  Lab 08/02/17 0713 08/02/17 1059 08/02/17 1601 08/02/17 2130 08/03/17 0724  GLUCAP 242* 316* 191* 206* 241*   D-Dimer No results for input(s): DDIMER in the last 72 hours. Hgb A1c Recent Labs    08/01/17 0125  HGBA1C 8.8*   Lipid Profile No results for input(s): CHOL, HDL, LDLCALC, TRIG, CHOLHDL, LDLDIRECT in the last 72 hours. Thyroid function studies No results for input(s): TSH, T4TOTAL, T3FREE, THYROIDAB in the last 72 hours.  Invalid input(s): FREET3 Anemia work up No results for input(s): VITAMINB12, FOLATE, FERRITIN, TIBC, IRON, RETICCTPCT in the last 72 hours. Urinalysis    Component Value Date/Time   COLORURINE YELLOW 08/01/2017 0149   APPEARANCEUR CLEAR 08/01/2017 0149   LABSPEC 1.018 08/01/2017 0149   PHURINE 5.0 08/01/2017 0149   GLUCOSEU >=500 (A) 08/01/2017 0149   HGBUR SMALL (A) 08/01/2017 0149   BILIRUBINUR NEGATIVE 08/01/2017 0149   KETONESUR 80 (A) 08/01/2017 0149   PROTEINUR NEGATIVE 08/01/2017 0149   UROBILINOGEN 0.2 02/23/2015 1433    NITRITE NEGATIVE 08/01/2017 0149   LEUKOCYTESUR NEGATIVE 08/01/2017 0149   Sepsis Labs Invalid input(s): PROCALCITONIN,  WBC,  LACTICIDVEN Microbiology Recent Results (from the past 240 hour(s))  Urine Culture     Status: None   Collection Time: 08/01/17  1:49 AM  Result Value Ref Range Status   Specimen Description   Final    URINE, CATHETERIZED Performed at [facility] [address] [place], [place] [address]    Special Requests   Final    NONE Performed at [facility], [address], [place], [place] [address]    Culture   Final    NO GROWTH Performed at [facility], [address], [place], [place] [address]    Report Status 08/02/2017 FINAL  Final  MRSA PCR Screening     Status: None   Collection Time: 08/01/17  5:34 AM  Result Value Ref Range Status   MRSA by PCR NEGATIVE NEGATIVE Final    Comment:        The GeneXpert MRSA Assay (FDA approved for NASAL specimens only), is one component of a comprehensive MRSA colonization surveillance program. It is not intended to diagnose MRSA infection nor to guide or monitor treatment for MRSA infections. Performed at [facility] [address] [place], [place] [address]      Time coordinating discharge: 45 minutes  SIGNED:   Coralie Keens, MD  [facility] 08/03/2017, 8:42 AM Pager [phone]  If 7PM-7AM, please contact night-coverage [email or web address] Password [email or web address]

## 2017-08-03 NOTE — NC FL2 (Signed)
[place] MEDICAID FL2 LEVEL OF CARE SCREENING TOOL     IDENTIFICATION  Patient Name: Beth Payne Birthdate: 10/28/1940 Sex: female Admission Date (Current Location): 08/01/2017  [facility] [place]   Facility and Address:  [facility],  [address], [place] [id number]      Provider Number: [id number]  Attending Physician Name and Address:  Coralie KeensArrien, Mauricio Daniel,*  Relative Name and Phone Number:       Current Level of Care: Hospital Recommended Level of Care: Skilled Nursing Facility Prior Approval Number:    Date Approved/Denied:   PASRR Number: [id number]  Discharge Plan: SNF    Current Diagnoses: Patient Active Problem List   Diagnosis Date Noted  . Hospice care patient 08/01/2017  . Anemia of chronic disease 06/22/2017  . Prolonged Q-T interval on ECG 05/14/2017  . Nausea and vomiting 05/14/2017  . Leukocytosis 05/14/2017  . DKA (diabetic ketoacidoses) (HCC) 02/02/2016  . DKA, type 1 (HCC) 02/02/2016  . Chronic pain syndrome   . Depression   . Anxiety state   . Hypothyroidism   . Diabetic gastroparesis (HCC) 01/19/2016  . Transaminasemia   . Transaminitis 12/13/2015  . Hypomagnesemia 11/24/2015  . Protein-calorie malnutrition, severe 11/22/2015  . Pressure ulcer, stage I, non-blanching erythema 11/22/2015  . 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 Dementia 11/20/2015  . Abdominal pain 11/20/2015  . Weakness generalized 05/20/2015  . Lung mass 05/20/2015  . Hypokalemia 04/24/2015  . Acute encephalopathy 01/01/2015  . Type 1 diabetes mellitus with peripheral circulatory complications (HCC) 12/17/2014  . CAD in native artery   . Pulmonary hypertension (HCC)   . GERD (gastroesophageal reflux disease) 12/12/2014  . CAD s/p stent to mid-RCA in 2001. 12/12/2014  . Osteoarthritis 12/12/2014  . Systolic murmur 12/12/2014  . Normocytic anemia 12/12/2014  . Dyslipidemia 12/12/2014    Orientation RESPIRATION BLADDER Height &  Weight     Self  Normal Continent Weight: 80 lb 7.5 oz (36.5 kg) Height:  5\' 2"  (157.5 cm)  BEHAVIORAL SYMPTOMS/MOOD NEUROLOGICAL BOWEL NUTRITION STATUS      Continent Diet(Diet recommendation: Diabetic prudent)  AMBULATORY STATUS COMMUNICATION OF NEEDS Skin     Verbally Normal                       Personal Care Assistance Level of Assistance  Bathing, Feeding, Dressing Bathing Assistance: Maximum assistance Feeding assistance: Independent Dressing Assistance: Maximum assistance     Functional Limitations Info  Sight, Hearing, Speech Sight Info: Adequate Hearing Info: Adequate Speech Info: Adequate    SPECIAL CARE FACTORS FREQUENCY                       Contractures Contractures Info: Not present    Additional Factors Info  Code Status, Allergies, Insulin Sliding Scale Code Status Info: DNR  Allergies Info: Allergies: Diclofenac Sodium, Doxycycline Calcium, Calcium-containing Compounds, Codeine, Zocor Simvastatin   Insulin Sliding Scale Info: See Medication List       Current Medications (08/03/2017):  This is the current hospital active medication list Current Facility-Administered Medications  Medication Dose Route Frequency Provider Last Rate Last Dose  . 0.9 %  sodium chloride infusion   Intravenous Continuous Meredeth IdeLama, Gagan S, MD 10 mL/hr at 08/02/17 0400    . amLODipine (NORVASC) tablet 10 mg  10 mg Oral Daily Meredeth IdeLama, Gagan S, MD   10 mg at 08/03/17 1014  . ARIPiprazole (ABILIFY) tablet 5 mg  5 mg Oral  Daily Lyda Perone M, DO   5 mg at 08/03/17 1014  . hydrALAZINE (APRESOLINE) tablet 25 mg  25 mg Oral Q6H PRN Meredeth Ide, MD   25 mg at 08/01/17 1205  . insulin aspart (novoLOG) injection 0-9 Units  0-9 Units Subcutaneous TID WC Meredeth Ide, MD   3 Units at 08/03/17 0810  . insulin glargine (LANTUS) injection 10 Units  10 Units Subcutaneous QHS Meredeth Ide, MD   10 Units at 08/02/17 2130  . lamoTRIgine (LAMICTAL) tablet 100 mg  100 mg Oral QHS  Lyda Perone M, DO   100 mg at 08/02/17 2131  . levothyroxine (SYNTHROID, LEVOTHROID) tablet 50 mcg  50 mcg Oral QAC breakfast Hillary Bow, DO   50 mcg at 08/03/17 9562  . LORazepam (ATIVAN) tablet 0.5 mg  0.5 mg Oral QHS Lyda Perone M, DO   0.5 mg at 08/02/17 2131  . metoCLOPramide (REGLAN) tablet 5 mg  5 mg Oral TID AC Hillary Bow, DO   5 mg at 08/03/17 1308  . multivitamin with minerals tablet 1 tablet  1 tablet Oral Daily Meredeth Ide, MD   1 tablet at 08/03/17 1014  . ondansetron (ZOFRAN) injection 4 mg  4 mg Intravenous Q6H PRN Meredeth Ide, MD      . pantoprazole (PROTONIX) injection 40 mg  40 mg Intravenous Q12H Meredeth Ide, MD   40 mg at 08/03/17 1014  . senna-docusate (Senokot-S) tablet 1 tablet  1 tablet Oral BID Alita Chyle, NP   1 tablet at 08/03/17 1014  . sertraline (ZOLOFT) tablet 50 mg  50 mg Oral BID Lyda Perone M, DO   50 mg at 08/03/17 1014     Discharge Medications: Please see discharge summary for a list of discharge medications.  Relevant Imaging Results:  Relevant Lab Results:   Additional Information SSN:[id number]  Clearance Coots, LCSW

## 2017-08-03 NOTE — Care Management Note (Signed)
Case Management Note  Patient Details  Name: Beth Payne MRN: [id number] Date of Birth: 09/21/1940  Subjective/Objective: d/c SNF.                   Action/Plan:   Expected Discharge Date:  08/03/17               Expected Discharge Plan:  Skilled Nursing Facility  In-House Referral:  Clinical Social Work  Discharge planning Services  CM Consult  Post Acute Care Choice:    Choice offered to:     DME Arranged:    DME Agency:     HH Arranged:    HH Agency:     Status of Service:  Completed, signed off  If discussed at [employer] [employer] Meetings, dates discussed:    Additional Comments:  Lanier ClamMahabir, Kirin Pastorino, RN 08/03/2017, 10:47 AM

## 2017-08-03 NOTE — Discharge Planning (Addendum)
CS[facility] assisting with [facility]. The admission coordinator reports the family did not do a bed hold and will need to complete admissions paperwork again.   CSW reached out to patient POA-[name], left voicemail. Waiting on return phone call.  Nurse will attempt to call as well.   Patient POA [name] returned call to facility and gave Wille CelesteJanie in admissions coordinator permission to readmit the patient.  CSW attempted to call again, No answer.   CSW will send patient back to [facility] with Palliative services to follow.  [facility] arranged for transport.  Nurse given number to call report.   Vivi BarrackNicole Florinda Taflinger, Theresia MajorsLCSWA, MSW Clinical Social Worker  [phone] 08/03/2017  11:53 AM

## 2017-08-04 DIAGNOSIS — N179 Acute kidney failure, unspecified: Secondary | ICD-10-CM | POA: Diagnosis not present

## 2017-08-04 DIAGNOSIS — R278 Other lack of coordination: Secondary | ICD-10-CM | POA: Diagnosis not present

## 2017-08-04 DIAGNOSIS — R41841 Cognitive communication deficit: Secondary | ICD-10-CM | POA: Diagnosis not present

## 2017-08-04 DIAGNOSIS — E039 Hypothyroidism, unspecified: Secondary | ICD-10-CM | POA: Diagnosis not present

## 2017-08-04 DIAGNOSIS — D649 Anemia, unspecified: Secondary | ICD-10-CM | POA: Diagnosis not present

## 2017-08-04 DIAGNOSIS — F0390 Unspecified dementia without behavioral disturbance: Secondary | ICD-10-CM | POA: Diagnosis not present

## 2017-08-04 DIAGNOSIS — E785 Hyperlipidemia, unspecified: Secondary | ICD-10-CM | POA: Diagnosis not present

## 2017-08-04 DIAGNOSIS — Z79899 Other long term (current) drug therapy: Secondary | ICD-10-CM | POA: Diagnosis not present

## 2017-08-04 DIAGNOSIS — R1110 Vomiting, unspecified: Secondary | ICD-10-CM | POA: Diagnosis not present

## 2017-08-04 DIAGNOSIS — G40909 Epilepsy, unspecified, not intractable, without status epilepticus: Secondary | ICD-10-CM | POA: Diagnosis not present

## 2017-08-04 DIAGNOSIS — R2689 Other abnormalities of gait and mobility: Secondary | ICD-10-CM | POA: Diagnosis not present

## 2017-08-04 DIAGNOSIS — R1312 Dysphagia, oropharyngeal phase: Secondary | ICD-10-CM | POA: Diagnosis not present

## 2017-08-04 DIAGNOSIS — M6281 Muscle weakness (generalized): Secondary | ICD-10-CM | POA: Diagnosis not present

## 2017-08-04 DIAGNOSIS — K920 Hematemesis: Secondary | ICD-10-CM | POA: Diagnosis not present

## 2017-08-04 DIAGNOSIS — E559 Vitamin D deficiency, unspecified: Secondary | ICD-10-CM | POA: Diagnosis not present

## 2017-08-04 DIAGNOSIS — E1010 Type 1 diabetes mellitus with ketoacidosis without coma: Secondary | ICD-10-CM | POA: Diagnosis not present

## 2017-08-04 DIAGNOSIS — I10 Essential (primary) hypertension: Secondary | ICD-10-CM | POA: Diagnosis not present

## 2017-08-05 DIAGNOSIS — R1312 Dysphagia, oropharyngeal phase: Secondary | ICD-10-CM | POA: Diagnosis not present

## 2017-08-05 DIAGNOSIS — R293 Abnormal posture: Secondary | ICD-10-CM | POA: Diagnosis not present

## 2017-08-05 DIAGNOSIS — R278 Other lack of coordination: Secondary | ICD-10-CM | POA: Diagnosis not present

## 2017-08-05 DIAGNOSIS — N179 Acute kidney failure, unspecified: Secondary | ICD-10-CM | POA: Diagnosis not present

## 2017-08-05 DIAGNOSIS — M6281 Muscle weakness (generalized): Secondary | ICD-10-CM | POA: Diagnosis not present

## 2017-08-05 DIAGNOSIS — F0390 Unspecified dementia without behavioral disturbance: Secondary | ICD-10-CM | POA: Diagnosis not present

## 2017-08-05 DIAGNOSIS — E1065 Type 1 diabetes mellitus with hyperglycemia: Secondary | ICD-10-CM | POA: Diagnosis not present

## 2017-08-05 DIAGNOSIS — R41841 Cognitive communication deficit: Secondary | ICD-10-CM | POA: Diagnosis not present

## 2017-08-05 DIAGNOSIS — R2689 Other abnormalities of gait and mobility: Secondary | ICD-10-CM | POA: Diagnosis not present

## 2017-08-07 DIAGNOSIS — R41841 Cognitive communication deficit: Secondary | ICD-10-CM | POA: Diagnosis not present

## 2017-08-07 DIAGNOSIS — R2689 Other abnormalities of gait and mobility: Secondary | ICD-10-CM | POA: Diagnosis not present

## 2017-08-07 DIAGNOSIS — E1065 Type 1 diabetes mellitus with hyperglycemia: Secondary | ICD-10-CM | POA: Diagnosis not present

## 2017-08-07 DIAGNOSIS — N179 Acute kidney failure, unspecified: Secondary | ICD-10-CM | POA: Diagnosis not present

## 2017-08-07 DIAGNOSIS — R1312 Dysphagia, oropharyngeal phase: Secondary | ICD-10-CM | POA: Diagnosis not present

## 2017-08-07 DIAGNOSIS — R278 Other lack of coordination: Secondary | ICD-10-CM | POA: Diagnosis not present

## 2017-08-08 DIAGNOSIS — N179 Acute kidney failure, unspecified: Secondary | ICD-10-CM | POA: Diagnosis not present

## 2017-08-08 DIAGNOSIS — R1312 Dysphagia, oropharyngeal phase: Secondary | ICD-10-CM | POA: Diagnosis not present

## 2017-08-08 DIAGNOSIS — R278 Other lack of coordination: Secondary | ICD-10-CM | POA: Diagnosis not present

## 2017-08-08 DIAGNOSIS — R41841 Cognitive communication deficit: Secondary | ICD-10-CM | POA: Diagnosis not present

## 2017-08-08 DIAGNOSIS — R2689 Other abnormalities of gait and mobility: Secondary | ICD-10-CM | POA: Diagnosis not present

## 2017-08-08 DIAGNOSIS — E1065 Type 1 diabetes mellitus with hyperglycemia: Secondary | ICD-10-CM | POA: Diagnosis not present

## 2017-08-09 DIAGNOSIS — R1312 Dysphagia, oropharyngeal phase: Secondary | ICD-10-CM | POA: Diagnosis not present

## 2017-08-09 DIAGNOSIS — R41841 Cognitive communication deficit: Secondary | ICD-10-CM | POA: Diagnosis not present

## 2017-08-09 DIAGNOSIS — N179 Acute kidney failure, unspecified: Secondary | ICD-10-CM | POA: Diagnosis not present

## 2017-08-09 DIAGNOSIS — R278 Other lack of coordination: Secondary | ICD-10-CM | POA: Diagnosis not present

## 2017-08-09 DIAGNOSIS — E1065 Type 1 diabetes mellitus with hyperglycemia: Secondary | ICD-10-CM | POA: Diagnosis not present

## 2017-08-09 DIAGNOSIS — R2689 Other abnormalities of gait and mobility: Secondary | ICD-10-CM | POA: Diagnosis not present

## 2017-08-10 DIAGNOSIS — R1312 Dysphagia, oropharyngeal phase: Secondary | ICD-10-CM | POA: Diagnosis not present

## 2017-08-10 DIAGNOSIS — R41841 Cognitive communication deficit: Secondary | ICD-10-CM | POA: Diagnosis not present

## 2017-08-10 DIAGNOSIS — R278 Other lack of coordination: Secondary | ICD-10-CM | POA: Diagnosis not present

## 2017-08-10 DIAGNOSIS — N179 Acute kidney failure, unspecified: Secondary | ICD-10-CM | POA: Diagnosis not present

## 2017-08-10 DIAGNOSIS — R2689 Other abnormalities of gait and mobility: Secondary | ICD-10-CM | POA: Diagnosis not present

## 2017-08-10 DIAGNOSIS — E1065 Type 1 diabetes mellitus with hyperglycemia: Secondary | ICD-10-CM | POA: Diagnosis not present

## 2017-08-11 DIAGNOSIS — R2689 Other abnormalities of gait and mobility: Secondary | ICD-10-CM | POA: Diagnosis not present

## 2017-08-11 DIAGNOSIS — E1065 Type 1 diabetes mellitus with hyperglycemia: Secondary | ICD-10-CM | POA: Diagnosis not present

## 2017-08-11 DIAGNOSIS — R1312 Dysphagia, oropharyngeal phase: Secondary | ICD-10-CM | POA: Diagnosis not present

## 2017-08-11 DIAGNOSIS — R278 Other lack of coordination: Secondary | ICD-10-CM | POA: Diagnosis not present

## 2017-08-11 DIAGNOSIS — R41841 Cognitive communication deficit: Secondary | ICD-10-CM | POA: Diagnosis not present

## 2017-08-11 DIAGNOSIS — N179 Acute kidney failure, unspecified: Secondary | ICD-10-CM | POA: Diagnosis not present

## 2017-08-17 DIAGNOSIS — R531 Weakness: Secondary | ICD-10-CM | POA: Diagnosis not present

## 2017-08-19 DIAGNOSIS — G40909 Epilepsy, unspecified, not intractable, without status epilepticus: Secondary | ICD-10-CM | POA: Diagnosis not present

## 2017-08-19 DIAGNOSIS — R634 Abnormal weight loss: Secondary | ICD-10-CM | POA: Diagnosis not present

## 2017-08-19 DIAGNOSIS — F0390 Unspecified dementia without behavioral disturbance: Secondary | ICD-10-CM | POA: Diagnosis not present

## 2017-08-19 DIAGNOSIS — E108 Type 1 diabetes mellitus with unspecified complications: Secondary | ICD-10-CM | POA: Diagnosis not present

## 2017-08-25 DIAGNOSIS — R531 Weakness: Secondary | ICD-10-CM | POA: Diagnosis not present

## 2017-08-26 DIAGNOSIS — F339 Major depressive disorder, recurrent, unspecified: Secondary | ICD-10-CM | POA: Diagnosis not present

## 2017-08-26 DIAGNOSIS — E1069 Type 1 diabetes mellitus with other specified complication: Secondary | ICD-10-CM | POA: Diagnosis not present

## 2017-08-26 DIAGNOSIS — I25119 Atherosclerotic heart disease of native coronary artery with unspecified angina pectoris: Secondary | ICD-10-CM | POA: Diagnosis not present

## 2017-08-26 DIAGNOSIS — G309 Alzheimer's disease, unspecified: Secondary | ICD-10-CM | POA: Diagnosis not present

## 2017-08-26 DIAGNOSIS — R569 Unspecified convulsions: Secondary | ICD-10-CM | POA: Diagnosis not present

## 2017-08-26 DIAGNOSIS — E039 Hypothyroidism, unspecified: Secondary | ICD-10-CM | POA: Diagnosis not present

## 2017-08-26 DIAGNOSIS — G459 Transient cerebral ischemic attack, unspecified: Secondary | ICD-10-CM | POA: Diagnosis not present

## 2017-08-26 DIAGNOSIS — E46 Unspecified protein-calorie malnutrition: Secondary | ICD-10-CM | POA: Diagnosis not present

## 2017-08-26 DIAGNOSIS — L8990 Pressure ulcer of unspecified site, unspecified stage: Secondary | ICD-10-CM | POA: Diagnosis not present

## 2017-08-26 DIAGNOSIS — K219 Gastro-esophageal reflux disease without esophagitis: Secondary | ICD-10-CM | POA: Diagnosis not present

## 2017-08-26 DIAGNOSIS — N183 Chronic kidney disease, stage 3 (moderate): Secondary | ICD-10-CM | POA: Diagnosis not present

## 2017-08-26 DIAGNOSIS — I10 Essential (primary) hypertension: Secondary | ICD-10-CM | POA: Diagnosis not present

## 2017-08-28 DIAGNOSIS — E46 Unspecified protein-calorie malnutrition: Secondary | ICD-10-CM | POA: Diagnosis not present

## 2017-08-28 DIAGNOSIS — E1069 Type 1 diabetes mellitus with other specified complication: Secondary | ICD-10-CM | POA: Diagnosis not present

## 2017-08-28 DIAGNOSIS — L8990 Pressure ulcer of unspecified site, unspecified stage: Secondary | ICD-10-CM | POA: Diagnosis not present

## 2017-08-28 DIAGNOSIS — G459 Transient cerebral ischemic attack, unspecified: Secondary | ICD-10-CM | POA: Diagnosis not present

## 2017-08-28 DIAGNOSIS — I25119 Atherosclerotic heart disease of native coronary artery with unspecified angina pectoris: Secondary | ICD-10-CM | POA: Diagnosis not present

## 2017-08-28 DIAGNOSIS — R569 Unspecified convulsions: Secondary | ICD-10-CM | POA: Diagnosis not present

## 2017-08-29 DIAGNOSIS — R569 Unspecified convulsions: Secondary | ICD-10-CM | POA: Diagnosis not present

## 2017-08-29 DIAGNOSIS — E46 Unspecified protein-calorie malnutrition: Secondary | ICD-10-CM | POA: Diagnosis not present

## 2017-08-29 DIAGNOSIS — E1069 Type 1 diabetes mellitus with other specified complication: Secondary | ICD-10-CM | POA: Diagnosis not present

## 2017-08-29 DIAGNOSIS — G459 Transient cerebral ischemic attack, unspecified: Secondary | ICD-10-CM | POA: Diagnosis not present

## 2017-08-29 DIAGNOSIS — L8990 Pressure ulcer of unspecified site, unspecified stage: Secondary | ICD-10-CM | POA: Diagnosis not present

## 2017-08-29 DIAGNOSIS — I25119 Atherosclerotic heart disease of native coronary artery with unspecified angina pectoris: Secondary | ICD-10-CM | POA: Diagnosis not present

## 2017-08-31 DIAGNOSIS — L89152 Pressure ulcer of sacral region, stage 2: Secondary | ICD-10-CM | POA: Diagnosis not present

## 2017-08-31 DIAGNOSIS — F0390 Unspecified dementia without behavioral disturbance: Secondary | ICD-10-CM | POA: Diagnosis not present

## 2017-08-31 DIAGNOSIS — F411 Generalized anxiety disorder: Secondary | ICD-10-CM | POA: Diagnosis not present

## 2017-08-31 DIAGNOSIS — F331 Major depressive disorder, recurrent, moderate: Secondary | ICD-10-CM | POA: Diagnosis not present

## 2017-09-01 DIAGNOSIS — R569 Unspecified convulsions: Secondary | ICD-10-CM | POA: Diagnosis not present

## 2017-09-01 DIAGNOSIS — L8990 Pressure ulcer of unspecified site, unspecified stage: Secondary | ICD-10-CM | POA: Diagnosis not present

## 2017-09-01 DIAGNOSIS — G459 Transient cerebral ischemic attack, unspecified: Secondary | ICD-10-CM | POA: Diagnosis not present

## 2017-09-01 DIAGNOSIS — I25119 Atherosclerotic heart disease of native coronary artery with unspecified angina pectoris: Secondary | ICD-10-CM | POA: Diagnosis not present

## 2017-09-01 DIAGNOSIS — E46 Unspecified protein-calorie malnutrition: Secondary | ICD-10-CM | POA: Diagnosis not present

## 2017-09-01 DIAGNOSIS — E1069 Type 1 diabetes mellitus with other specified complication: Secondary | ICD-10-CM | POA: Diagnosis not present

## 2017-09-05 DIAGNOSIS — G309 Alzheimer's disease, unspecified: Secondary | ICD-10-CM | POA: Diagnosis not present

## 2017-09-05 DIAGNOSIS — I25119 Atherosclerotic heart disease of native coronary artery with unspecified angina pectoris: Secondary | ICD-10-CM | POA: Diagnosis not present

## 2017-09-05 DIAGNOSIS — E1069 Type 1 diabetes mellitus with other specified complication: Secondary | ICD-10-CM | POA: Diagnosis not present

## 2017-09-05 DIAGNOSIS — I10 Essential (primary) hypertension: Secondary | ICD-10-CM | POA: Diagnosis not present

## 2017-09-05 DIAGNOSIS — E039 Hypothyroidism, unspecified: Secondary | ICD-10-CM | POA: Diagnosis not present

## 2017-09-05 DIAGNOSIS — G459 Transient cerebral ischemic attack, unspecified: Secondary | ICD-10-CM | POA: Diagnosis not present

## 2017-09-05 DIAGNOSIS — N183 Chronic kidney disease, stage 3 (moderate): Secondary | ICD-10-CM | POA: Diagnosis not present

## 2017-09-05 DIAGNOSIS — E46 Unspecified protein-calorie malnutrition: Secondary | ICD-10-CM | POA: Diagnosis not present

## 2017-09-05 DIAGNOSIS — R569 Unspecified convulsions: Secondary | ICD-10-CM | POA: Diagnosis not present

## 2017-09-05 DIAGNOSIS — L8990 Pressure ulcer of unspecified site, unspecified stage: Secondary | ICD-10-CM | POA: Diagnosis not present

## 2017-09-05 DIAGNOSIS — F339 Major depressive disorder, recurrent, unspecified: Secondary | ICD-10-CM | POA: Diagnosis not present

## 2017-09-05 DIAGNOSIS — K219 Gastro-esophageal reflux disease without esophagitis: Secondary | ICD-10-CM | POA: Diagnosis not present

## 2017-09-06 DIAGNOSIS — R569 Unspecified convulsions: Secondary | ICD-10-CM | POA: Diagnosis not present

## 2017-09-06 DIAGNOSIS — G459 Transient cerebral ischemic attack, unspecified: Secondary | ICD-10-CM | POA: Diagnosis not present

## 2017-09-06 DIAGNOSIS — E46 Unspecified protein-calorie malnutrition: Secondary | ICD-10-CM | POA: Diagnosis not present

## 2017-09-06 DIAGNOSIS — L8990 Pressure ulcer of unspecified site, unspecified stage: Secondary | ICD-10-CM | POA: Diagnosis not present

## 2017-09-06 DIAGNOSIS — E1069 Type 1 diabetes mellitus with other specified complication: Secondary | ICD-10-CM | POA: Diagnosis not present

## 2017-09-06 DIAGNOSIS — I25119 Atherosclerotic heart disease of native coronary artery with unspecified angina pectoris: Secondary | ICD-10-CM | POA: Diagnosis not present

## 2017-09-07 DIAGNOSIS — F0390 Unspecified dementia without behavioral disturbance: Secondary | ICD-10-CM | POA: Diagnosis not present

## 2017-09-07 DIAGNOSIS — F331 Major depressive disorder, recurrent, moderate: Secondary | ICD-10-CM | POA: Diagnosis not present

## 2017-09-07 DIAGNOSIS — F411 Generalized anxiety disorder: Secondary | ICD-10-CM | POA: Diagnosis not present

## 2017-09-08 DIAGNOSIS — L8990 Pressure ulcer of unspecified site, unspecified stage: Secondary | ICD-10-CM | POA: Diagnosis not present

## 2017-09-08 DIAGNOSIS — E1069 Type 1 diabetes mellitus with other specified complication: Secondary | ICD-10-CM | POA: Diagnosis not present

## 2017-09-08 DIAGNOSIS — R569 Unspecified convulsions: Secondary | ICD-10-CM | POA: Diagnosis not present

## 2017-09-08 DIAGNOSIS — G459 Transient cerebral ischemic attack, unspecified: Secondary | ICD-10-CM | POA: Diagnosis not present

## 2017-09-08 DIAGNOSIS — E46 Unspecified protein-calorie malnutrition: Secondary | ICD-10-CM | POA: Diagnosis not present

## 2017-09-08 DIAGNOSIS — I25119 Atherosclerotic heart disease of native coronary artery with unspecified angina pectoris: Secondary | ICD-10-CM | POA: Diagnosis not present

## 2017-09-13 DIAGNOSIS — F0390 Unspecified dementia without behavioral disturbance: Secondary | ICD-10-CM | POA: Diagnosis not present

## 2017-09-13 DIAGNOSIS — F331 Major depressive disorder, recurrent, moderate: Secondary | ICD-10-CM | POA: Diagnosis not present

## 2017-09-13 DIAGNOSIS — F411 Generalized anxiety disorder: Secondary | ICD-10-CM | POA: Diagnosis not present

## 2017-09-14 DIAGNOSIS — R569 Unspecified convulsions: Secondary | ICD-10-CM | POA: Diagnosis not present

## 2017-09-14 DIAGNOSIS — G459 Transient cerebral ischemic attack, unspecified: Secondary | ICD-10-CM | POA: Diagnosis not present

## 2017-09-14 DIAGNOSIS — F331 Major depressive disorder, recurrent, moderate: Secondary | ICD-10-CM | POA: Diagnosis not present

## 2017-09-14 DIAGNOSIS — E46 Unspecified protein-calorie malnutrition: Secondary | ICD-10-CM | POA: Diagnosis not present

## 2017-09-14 DIAGNOSIS — G4700 Insomnia, unspecified: Secondary | ICD-10-CM | POA: Diagnosis not present

## 2017-09-14 DIAGNOSIS — L8990 Pressure ulcer of unspecified site, unspecified stage: Secondary | ICD-10-CM | POA: Diagnosis not present

## 2017-09-14 DIAGNOSIS — F411 Generalized anxiety disorder: Secondary | ICD-10-CM | POA: Diagnosis not present

## 2017-09-14 DIAGNOSIS — I25119 Atherosclerotic heart disease of native coronary artery with unspecified angina pectoris: Secondary | ICD-10-CM | POA: Diagnosis not present

## 2017-09-14 DIAGNOSIS — F0390 Unspecified dementia without behavioral disturbance: Secondary | ICD-10-CM | POA: Diagnosis not present

## 2017-09-14 DIAGNOSIS — E1069 Type 1 diabetes mellitus with other specified complication: Secondary | ICD-10-CM | POA: Diagnosis not present

## 2017-09-16 DIAGNOSIS — E161 Other hypoglycemia: Secondary | ICD-10-CM | POA: Diagnosis not present

## 2017-09-19 DIAGNOSIS — G4700 Insomnia, unspecified: Secondary | ICD-10-CM | POA: Diagnosis not present

## 2017-09-19 DIAGNOSIS — F331 Major depressive disorder, recurrent, moderate: Secondary | ICD-10-CM | POA: Diagnosis not present

## 2017-09-19 DIAGNOSIS — F0390 Unspecified dementia without behavioral disturbance: Secondary | ICD-10-CM | POA: Diagnosis not present

## 2017-09-19 DIAGNOSIS — F411 Generalized anxiety disorder: Secondary | ICD-10-CM | POA: Diagnosis not present

## 2017-09-20 DIAGNOSIS — L8990 Pressure ulcer of unspecified site, unspecified stage: Secondary | ICD-10-CM | POA: Diagnosis not present

## 2017-09-20 DIAGNOSIS — G459 Transient cerebral ischemic attack, unspecified: Secondary | ICD-10-CM | POA: Diagnosis not present

## 2017-09-20 DIAGNOSIS — E1069 Type 1 diabetes mellitus with other specified complication: Secondary | ICD-10-CM | POA: Diagnosis not present

## 2017-09-20 DIAGNOSIS — E46 Unspecified protein-calorie malnutrition: Secondary | ICD-10-CM | POA: Diagnosis not present

## 2017-09-20 DIAGNOSIS — I25119 Atherosclerotic heart disease of native coronary artery with unspecified angina pectoris: Secondary | ICD-10-CM | POA: Diagnosis not present

## 2017-09-20 DIAGNOSIS — R569 Unspecified convulsions: Secondary | ICD-10-CM | POA: Diagnosis not present

## 2017-09-21 DIAGNOSIS — K219 Gastro-esophageal reflux disease without esophagitis: Secondary | ICD-10-CM | POA: Diagnosis not present

## 2017-09-21 DIAGNOSIS — E1010 Type 1 diabetes mellitus with ketoacidosis without coma: Secondary | ICD-10-CM | POA: Diagnosis not present

## 2017-09-21 DIAGNOSIS — E1069 Type 1 diabetes mellitus with other specified complication: Secondary | ICD-10-CM | POA: Diagnosis not present

## 2017-09-21 DIAGNOSIS — L8990 Pressure ulcer of unspecified site, unspecified stage: Secondary | ICD-10-CM | POA: Diagnosis not present

## 2017-09-21 DIAGNOSIS — E46 Unspecified protein-calorie malnutrition: Secondary | ICD-10-CM | POA: Diagnosis not present

## 2017-09-21 DIAGNOSIS — G459 Transient cerebral ischemic attack, unspecified: Secondary | ICD-10-CM | POA: Diagnosis not present

## 2017-09-21 DIAGNOSIS — R569 Unspecified convulsions: Secondary | ICD-10-CM | POA: Diagnosis not present

## 2017-09-21 DIAGNOSIS — I10 Essential (primary) hypertension: Secondary | ICD-10-CM | POA: Diagnosis not present

## 2017-09-21 DIAGNOSIS — G40909 Epilepsy, unspecified, not intractable, without status epilepticus: Secondary | ICD-10-CM | POA: Diagnosis not present

## 2017-09-21 DIAGNOSIS — I25119 Atherosclerotic heart disease of native coronary artery with unspecified angina pectoris: Secondary | ICD-10-CM | POA: Diagnosis not present

## 2017-09-22 DIAGNOSIS — L8990 Pressure ulcer of unspecified site, unspecified stage: Secondary | ICD-10-CM | POA: Diagnosis not present

## 2017-09-22 DIAGNOSIS — G459 Transient cerebral ischemic attack, unspecified: Secondary | ICD-10-CM | POA: Diagnosis not present

## 2017-09-22 DIAGNOSIS — E46 Unspecified protein-calorie malnutrition: Secondary | ICD-10-CM | POA: Diagnosis not present

## 2017-09-22 DIAGNOSIS — E1069 Type 1 diabetes mellitus with other specified complication: Secondary | ICD-10-CM | POA: Diagnosis not present

## 2017-09-22 DIAGNOSIS — I25119 Atherosclerotic heart disease of native coronary artery with unspecified angina pectoris: Secondary | ICD-10-CM | POA: Diagnosis not present

## 2017-09-22 DIAGNOSIS — R569 Unspecified convulsions: Secondary | ICD-10-CM | POA: Diagnosis not present

## 2017-09-30 LAB — BLOOD GAS, VENOUS
Acid-base deficit: 8.1 mmol/L — ABNORMAL HIGH (ref 0.0–2.0)
BICARBONATE: 15.3 mmol/L — AB (ref 20.0–28.0)
Drawn by: 51425
O2 Saturation: 61.6 %
Patient temperature: 98.6
pCO2, Ven: 26.4 mmHg — ABNORMAL LOW (ref 44.0–60.0)
pH, Ven: 7.38 (ref 7.250–7.430)
pO2, Ven: 35.9 mmHg (ref 32.0–45.0)

## 2017-10-03 DIAGNOSIS — F0390 Unspecified dementia without behavioral disturbance: Secondary | ICD-10-CM | POA: Diagnosis not present

## 2017-10-03 DIAGNOSIS — G4700 Insomnia, unspecified: Secondary | ICD-10-CM | POA: Diagnosis not present

## 2017-10-03 DIAGNOSIS — F411 Generalized anxiety disorder: Secondary | ICD-10-CM | POA: Diagnosis not present

## 2017-10-03 DIAGNOSIS — F331 Major depressive disorder, recurrent, moderate: Secondary | ICD-10-CM | POA: Diagnosis not present

## 2017-10-05 DEATH — deceased
